# Patient Record
Sex: Female | Born: 1945 | Race: White | Hispanic: No | Smoking: Never smoker
Health system: Southern US, Community
[De-identification: ages and names within clinical notes are randomized; demographics above are authoritative.]

## PROBLEM LIST (undated history)

## (undated) DIAGNOSIS — F3289 Other specified depressive episodes: Secondary | ICD-10-CM

## (undated) DIAGNOSIS — K5792 Diverticulitis of intestine, part unspecified, without perforation or abscess without bleeding: Secondary | ICD-10-CM

## (undated) DIAGNOSIS — G252 Other specified forms of tremor: Secondary | ICD-10-CM

## (undated) DIAGNOSIS — R300 Dysuria: Secondary | ICD-10-CM

## (undated) DIAGNOSIS — K047 Periapical abscess without sinus: Secondary | ICD-10-CM

## (undated) DIAGNOSIS — R935 Abnormal findings on diagnostic imaging of other abdominal regions, including retroperitoneum: Secondary | ICD-10-CM

## (undated) DIAGNOSIS — R32 Unspecified urinary incontinence: Secondary | ICD-10-CM

## (undated) DIAGNOSIS — F329 Major depressive disorder, single episode, unspecified: Secondary | ICD-10-CM

## (undated) DIAGNOSIS — G250 Essential tremor: Secondary | ICD-10-CM

## (undated) DIAGNOSIS — M25569 Pain in unspecified knee: Secondary | ICD-10-CM

## (undated) DIAGNOSIS — K7460 Unspecified cirrhosis of liver: Secondary | ICD-10-CM

## (undated) DIAGNOSIS — K219 Gastro-esophageal reflux disease without esophagitis: Secondary | ICD-10-CM

## (undated) DIAGNOSIS — F341 Dysthymic disorder: Secondary | ICD-10-CM

## (undated) DIAGNOSIS — N3000 Acute cystitis without hematuria: Secondary | ICD-10-CM

## (undated) DIAGNOSIS — J029 Acute pharyngitis, unspecified: Secondary | ICD-10-CM

## (undated) DIAGNOSIS — K862 Cyst of pancreas: Secondary | ICD-10-CM

## (undated) DIAGNOSIS — K589 Irritable bowel syndrome without diarrhea: Secondary | ICD-10-CM

## (undated) DIAGNOSIS — R609 Edema, unspecified: Secondary | ICD-10-CM

## (undated) DIAGNOSIS — C649 Malignant neoplasm of unspecified kidney, except renal pelvis: Secondary | ICD-10-CM

## (undated) DIAGNOSIS — E119 Type 2 diabetes mellitus without complications: Secondary | ICD-10-CM

## (undated) DIAGNOSIS — M719 Bursopathy, unspecified: Secondary | ICD-10-CM

## (undated) DIAGNOSIS — I498 Other specified cardiac arrhythmias: Secondary | ICD-10-CM

## (undated) DIAGNOSIS — G4730 Sleep apnea, unspecified: Secondary | ICD-10-CM

## (undated) DIAGNOSIS — R932 Abnormal findings on diagnostic imaging of liver and biliary tract: Secondary | ICD-10-CM

## (undated) DIAGNOSIS — K5730 Diverticulosis of large intestine without perforation or abscess without bleeding: Secondary | ICD-10-CM

## (undated) DIAGNOSIS — I499 Cardiac arrhythmia, unspecified: Secondary | ICD-10-CM

## (undated) DIAGNOSIS — J439 Emphysema, unspecified: Secondary | ICD-10-CM

## (undated) DIAGNOSIS — E1149 Type 2 diabetes mellitus with other diabetic neurological complication: Secondary | ICD-10-CM

## (undated) DIAGNOSIS — T8859XA Other complications of anesthesia, initial encounter: Secondary | ICD-10-CM

## (undated) DIAGNOSIS — T4145XA Adverse effect of unspecified anesthetic, initial encounter: Secondary | ICD-10-CM

## (undated) DIAGNOSIS — I509 Heart failure, unspecified: Secondary | ICD-10-CM

## (undated) DIAGNOSIS — Z5189 Encounter for other specified aftercare: Secondary | ICD-10-CM

## (undated) DIAGNOSIS — R9389 Abnormal findings on diagnostic imaging of other specified body structures: Secondary | ICD-10-CM

## (undated) DIAGNOSIS — E669 Obesity, unspecified: Secondary | ICD-10-CM

## (undated) DIAGNOSIS — E1142 Type 2 diabetes mellitus with diabetic polyneuropathy: Secondary | ICD-10-CM

## (undated) DIAGNOSIS — K863 Pseudocyst of pancreas: Secondary | ICD-10-CM

## (undated) DIAGNOSIS — M1990 Unspecified osteoarthritis, unspecified site: Secondary | ICD-10-CM

## (undated) DIAGNOSIS — F419 Anxiety disorder, unspecified: Secondary | ICD-10-CM

## (undated) DIAGNOSIS — E7800 Pure hypercholesterolemia, unspecified: Secondary | ICD-10-CM

## (undated) DIAGNOSIS — R112 Nausea with vomiting, unspecified: Secondary | ICD-10-CM

## (undated) DIAGNOSIS — J309 Allergic rhinitis, unspecified: Secondary | ICD-10-CM

## (undated) DIAGNOSIS — H269 Unspecified cataract: Secondary | ICD-10-CM

## (undated) DIAGNOSIS — Z87442 Personal history of urinary calculi: Secondary | ICD-10-CM

## (undated) DIAGNOSIS — Z9889 Other specified postprocedural states: Secondary | ICD-10-CM

## (undated) DIAGNOSIS — Z8719 Personal history of other diseases of the digestive system: Secondary | ICD-10-CM

## (undated) DIAGNOSIS — J45909 Unspecified asthma, uncomplicated: Secondary | ICD-10-CM

## (undated) DIAGNOSIS — D126 Benign neoplasm of colon, unspecified: Secondary | ICD-10-CM

## (undated) DIAGNOSIS — T7840XA Allergy, unspecified, initial encounter: Secondary | ICD-10-CM

## (undated) DIAGNOSIS — K769 Liver disease, unspecified: Secondary | ICD-10-CM

## (undated) DIAGNOSIS — R0600 Dyspnea, unspecified: Secondary | ICD-10-CM

## (undated) DIAGNOSIS — J984 Other disorders of lung: Secondary | ICD-10-CM

## (undated) HISTORY — DX: Other specified forms of tremor: G25.2

## (undated) HISTORY — DX: Type 2 diabetes mellitus without complications: E11.9

## (undated) HISTORY — DX: Sleep apnea, unspecified: G47.30

## (undated) HISTORY — DX: Type 2 diabetes mellitus with other diabetic neurological complication: E11.49

## (undated) HISTORY — DX: Gastro-esophageal reflux disease without esophagitis: K21.9

## (undated) HISTORY — DX: Allergic rhinitis, unspecified: J30.9

## (undated) HISTORY — DX: Other specified cardiac arrhythmias: I49.8

## (undated) HISTORY — DX: Edema, unspecified: R60.9

## (undated) HISTORY — DX: Abnormal findings on diagnostic imaging of liver and biliary tract: R93.2

## (undated) HISTORY — DX: Cyst of pancreas: K86.2

## (undated) HISTORY — DX: Major depressive disorder, single episode, unspecified: F32.9

## (undated) HISTORY — DX: Pain in unspecified knee: M25.569

## (undated) HISTORY — DX: Other specified depressive episodes: F32.89

## (undated) HISTORY — PX: ABDOMINAL HYSTERECTOMY: SHX81

## (undated) HISTORY — DX: Other disorders of lung: J98.4

## (undated) HISTORY — DX: Irritable bowel syndrome, unspecified: K58.9

## (undated) HISTORY — DX: Abnormal findings on diagnostic imaging of other abdominal regions, including retroperitoneum: R93.5

## (undated) HISTORY — DX: Diverticulosis of large intestine without perforation or abscess without bleeding: K57.30

## (undated) HISTORY — DX: Dysuria: R30.0

## (undated) HISTORY — PX: APPENDECTOMY: SHX54

## (undated) HISTORY — DX: Periapical abscess without sinus: K04.7

## (undated) HISTORY — DX: Type 2 diabetes mellitus with diabetic polyneuropathy: E11.42

## (undated) HISTORY — PX: HERNIA REPAIR: SHX51

## (undated) HISTORY — DX: Cyst of pancreas: K86.3

## (undated) HISTORY — DX: Diverticulitis of intestine, part unspecified, without perforation or abscess without bleeding: K57.92

## (undated) HISTORY — DX: Unspecified urinary incontinence: R32

## (undated) HISTORY — DX: Liver disease, unspecified: K76.9

## (undated) HISTORY — PX: LITHOTRIPSY: SUR834

## (undated) HISTORY — DX: Benign neoplasm of colon, unspecified: D12.6

## (undated) HISTORY — DX: Acute cystitis without hematuria: N30.00

## (undated) HISTORY — DX: Pure hypercholesterolemia, unspecified: E78.00

## (undated) HISTORY — DX: Acute pharyngitis, unspecified: J02.9

## (undated) HISTORY — PX: CHOLECYSTECTOMY: SHX55

## (undated) HISTORY — DX: Dysthymic disorder: F34.1

## (undated) HISTORY — DX: Obesity, unspecified: E66.9

## (undated) HISTORY — DX: Essential tremor: G25.0

## (undated) HISTORY — DX: Abnormal findings on diagnostic imaging of other specified body structures: R93.89

---
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
## 1979-03-23 HISTORY — PX: KIDNEY STONE SURGERY: SHX686

## 1989-03-22 HISTORY — PX: PARTIAL HYSTERECTOMY: SHX80

## 1997-11-17 ENCOUNTER — Emergency Department (HOSPITAL_COMMUNITY): Admission: EM | Admit: 1997-11-17 | Discharge: 1997-11-17 | Payer: Self-pay | Admitting: Emergency Medicine

## 1998-09-09 ENCOUNTER — Encounter: Payer: Self-pay | Admitting: Emergency Medicine

## 1998-09-09 ENCOUNTER — Emergency Department (HOSPITAL_COMMUNITY): Admission: EM | Admit: 1998-09-09 | Discharge: 1998-09-09 | Payer: Self-pay | Admitting: Emergency Medicine

## 1998-09-25 ENCOUNTER — Encounter: Payer: Self-pay | Admitting: Surgery

## 1998-09-27 ENCOUNTER — Ambulatory Visit (HOSPITAL_COMMUNITY): Admission: RE | Admit: 1998-09-27 | Discharge: 1998-09-28 | Payer: Self-pay | Admitting: Surgery

## 1998-09-27 ENCOUNTER — Encounter: Payer: Self-pay | Admitting: Surgery

## 2000-07-21 ENCOUNTER — Encounter: Admission: RE | Admit: 2000-07-21 | Discharge: 2000-07-21 | Payer: Self-pay

## 2002-04-21 ENCOUNTER — Ambulatory Visit (HOSPITAL_COMMUNITY): Admission: RE | Admit: 2002-04-21 | Discharge: 2002-04-21 | Payer: Self-pay

## 2003-01-04 ENCOUNTER — Ambulatory Visit (HOSPITAL_COMMUNITY): Admission: RE | Admit: 2003-01-04 | Discharge: 2003-01-04 | Payer: Self-pay | Admitting: Internal Medicine

## 2003-07-23 HISTORY — PX: ROTATOR CUFF REPAIR: SHX139

## 2003-07-31 ENCOUNTER — Ambulatory Visit (HOSPITAL_BASED_OUTPATIENT_CLINIC_OR_DEPARTMENT_OTHER): Admission: RE | Admit: 2003-07-31 | Discharge: 2003-07-31 | Payer: Self-pay

## 2004-07-25 ENCOUNTER — Inpatient Hospital Stay (HOSPITAL_COMMUNITY): Admission: EM | Admit: 2004-07-25 | Discharge: 2004-07-27 | Payer: Self-pay | Admitting: Emergency Medicine

## 2004-08-10 ENCOUNTER — Ambulatory Visit (HOSPITAL_COMMUNITY): Admission: RE | Admit: 2004-08-10 | Discharge: 2004-08-10 | Payer: Self-pay | Admitting: Orthopedic Surgery

## 2004-10-23 ENCOUNTER — Ambulatory Visit: Payer: Self-pay | Admitting: Internal Medicine

## 2004-12-26 ENCOUNTER — Ambulatory Visit: Payer: Self-pay | Admitting: Internal Medicine

## 2004-12-28 ENCOUNTER — Encounter: Admission: RE | Admit: 2004-12-28 | Discharge: 2004-12-28 | Payer: Self-pay | Admitting: Neurology

## 2005-05-27 ENCOUNTER — Ambulatory Visit: Payer: Self-pay | Admitting: Internal Medicine

## 2005-11-25 ENCOUNTER — Ambulatory Visit: Payer: Self-pay | Admitting: Internal Medicine

## 2006-03-25 ENCOUNTER — Ambulatory Visit: Payer: Self-pay | Admitting: Internal Medicine

## 2006-04-02 ENCOUNTER — Ambulatory Visit: Payer: Self-pay | Admitting: Internal Medicine

## 2006-04-03 ENCOUNTER — Ambulatory Visit: Payer: Self-pay | Admitting: Internal Medicine

## 2006-05-02 ENCOUNTER — Ambulatory Visit: Payer: Self-pay | Admitting: Internal Medicine

## 2006-05-02 LAB — CONVERTED CEMR LAB
BUN: 15 mg/dL (ref 6–23)
Creatinine, Ser: 1.1 mg/dL (ref 0.4–1.2)

## 2006-05-07 ENCOUNTER — Ambulatory Visit (HOSPITAL_COMMUNITY): Admission: RE | Admit: 2006-05-07 | Discharge: 2006-05-07 | Payer: Self-pay | Admitting: Internal Medicine

## 2006-05-15 ENCOUNTER — Ambulatory Visit (HOSPITAL_COMMUNITY): Admission: RE | Admit: 2006-05-15 | Discharge: 2006-05-15 | Payer: Self-pay | Admitting: Internal Medicine

## 2006-05-29 ENCOUNTER — Ambulatory Visit (HOSPITAL_COMMUNITY): Admission: RE | Admit: 2006-05-29 | Discharge: 2006-05-29 | Payer: Self-pay | Admitting: Gastroenterology

## 2006-05-29 ENCOUNTER — Encounter (INDEPENDENT_AMBULATORY_CARE_PROVIDER_SITE_OTHER): Payer: Self-pay | Admitting: Specialist

## 2006-06-05 ENCOUNTER — Ambulatory Visit: Payer: Self-pay | Admitting: Gastroenterology

## 2006-06-24 ENCOUNTER — Ambulatory Visit: Payer: Self-pay | Admitting: Family Medicine

## 2006-07-08 ENCOUNTER — Ambulatory Visit: Payer: Self-pay | Admitting: Family Medicine

## 2006-07-24 ENCOUNTER — Encounter: Payer: Self-pay | Admitting: Family Medicine

## 2006-07-24 ENCOUNTER — Encounter: Admission: RE | Admit: 2006-07-24 | Discharge: 2006-07-24 | Payer: Self-pay | Admitting: Family Medicine

## 2006-07-29 ENCOUNTER — Ambulatory Visit: Payer: Self-pay | Admitting: Family Medicine

## 2006-07-31 ENCOUNTER — Encounter: Admission: RE | Admit: 2006-07-31 | Discharge: 2006-10-29 | Payer: Self-pay | Admitting: Family Medicine

## 2006-08-01 ENCOUNTER — Encounter: Payer: Self-pay | Admitting: Family Medicine

## 2006-08-01 ENCOUNTER — Encounter: Admission: RE | Admit: 2006-08-01 | Discharge: 2006-08-01 | Payer: Self-pay | Admitting: Family Medicine

## 2006-08-07 ENCOUNTER — Ambulatory Visit: Payer: Self-pay | Admitting: Internal Medicine

## 2006-08-12 ENCOUNTER — Ambulatory Visit: Payer: Self-pay | Admitting: Family Medicine

## 2006-08-13 ENCOUNTER — Ambulatory Visit (HOSPITAL_COMMUNITY): Admission: RE | Admit: 2006-08-13 | Discharge: 2006-08-13 | Payer: Self-pay | Admitting: Internal Medicine

## 2006-08-25 ENCOUNTER — Ambulatory Visit: Payer: Self-pay | Admitting: Internal Medicine

## 2006-08-25 LAB — CONVERTED CEMR LAB
ANA Titer 1: 1:160 {titer} — ABNORMAL HIGH
Anti Nuclear Antibody(ANA): POSITIVE — AB
IgM, Serum: 160 mg/dL (ref 60–263)

## 2006-09-23 ENCOUNTER — Ambulatory Visit (HOSPITAL_COMMUNITY): Admission: RE | Admit: 2006-09-23 | Discharge: 2006-09-23 | Payer: Self-pay | Admitting: Internal Medicine

## 2006-09-25 ENCOUNTER — Ambulatory Visit: Payer: Self-pay | Admitting: Family Medicine

## 2006-09-26 LAB — CONVERTED CEMR LAB: Hgb A1c MFr Bld: 7.2 % — ABNORMAL HIGH (ref 4.6–6.0)

## 2006-10-03 ENCOUNTER — Ambulatory Visit: Payer: Self-pay | Admitting: Internal Medicine

## 2006-10-13 ENCOUNTER — Ambulatory Visit (HOSPITAL_COMMUNITY): Admission: RE | Admit: 2006-10-13 | Discharge: 2006-10-13 | Payer: Self-pay | Admitting: Internal Medicine

## 2006-10-13 ENCOUNTER — Encounter (INDEPENDENT_AMBULATORY_CARE_PROVIDER_SITE_OTHER): Payer: Self-pay | Admitting: *Deleted

## 2006-11-08 ENCOUNTER — Emergency Department (HOSPITAL_COMMUNITY): Admission: EM | Admit: 2006-11-08 | Discharge: 2006-11-08 | Payer: Self-pay | Admitting: Emergency Medicine

## 2006-11-17 ENCOUNTER — Ambulatory Visit: Payer: Self-pay | Admitting: Internal Medicine

## 2006-11-17 LAB — CONVERTED CEMR LAB
Albumin: 3.4 g/dL — ABNORMAL LOW (ref 3.5–5.2)
Bilirubin, Direct: 0.1 mg/dL (ref 0.0–0.3)
Total Bilirubin: 0.8 mg/dL (ref 0.3–1.2)

## 2006-11-20 ENCOUNTER — Ambulatory Visit: Payer: Self-pay | Admitting: Internal Medicine

## 2006-12-09 ENCOUNTER — Encounter: Payer: Self-pay | Admitting: Family Medicine

## 2006-12-18 ENCOUNTER — Ambulatory Visit: Payer: Self-pay | Admitting: Internal Medicine

## 2006-12-18 ENCOUNTER — Encounter: Payer: Self-pay | Admitting: Internal Medicine

## 2006-12-18 ENCOUNTER — Encounter: Payer: Self-pay | Admitting: Family Medicine

## 2006-12-18 LAB — HM COLONOSCOPY: HM Colonoscopy: POSITIVE

## 2006-12-22 ENCOUNTER — Ambulatory Visit: Payer: Self-pay | Admitting: Family Medicine

## 2006-12-22 DIAGNOSIS — E1165 Type 2 diabetes mellitus with hyperglycemia: Secondary | ICD-10-CM | POA: Insufficient documentation

## 2006-12-22 LAB — CONVERTED CEMR LAB: Hgb A1c MFr Bld: 7.6 % — ABNORMAL HIGH (ref 4.6–6.0)

## 2006-12-30 ENCOUNTER — Ambulatory Visit: Payer: Self-pay | Admitting: Internal Medicine

## 2006-12-30 DIAGNOSIS — K5730 Diverticulosis of large intestine without perforation or abscess without bleeding: Secondary | ICD-10-CM | POA: Insufficient documentation

## 2006-12-30 DIAGNOSIS — K589 Irritable bowel syndrome without diarrhea: Secondary | ICD-10-CM

## 2006-12-30 DIAGNOSIS — J3089 Other allergic rhinitis: Secondary | ICD-10-CM

## 2006-12-30 DIAGNOSIS — G4733 Obstructive sleep apnea (adult) (pediatric): Secondary | ICD-10-CM

## 2006-12-30 DIAGNOSIS — J302 Other seasonal allergic rhinitis: Secondary | ICD-10-CM

## 2006-12-30 DIAGNOSIS — D126 Benign neoplasm of colon, unspecified: Secondary | ICD-10-CM

## 2006-12-30 DIAGNOSIS — K219 Gastro-esophageal reflux disease without esophagitis: Secondary | ICD-10-CM | POA: Insufficient documentation

## 2006-12-30 DIAGNOSIS — R32 Unspecified urinary incontinence: Secondary | ICD-10-CM | POA: Insufficient documentation

## 2006-12-30 DIAGNOSIS — E669 Obesity, unspecified: Secondary | ICD-10-CM

## 2006-12-30 DIAGNOSIS — R002 Palpitations: Secondary | ICD-10-CM | POA: Insufficient documentation

## 2006-12-30 DIAGNOSIS — G252 Other specified forms of tremor: Secondary | ICD-10-CM

## 2006-12-30 DIAGNOSIS — E7800 Pure hypercholesterolemia, unspecified: Secondary | ICD-10-CM | POA: Insufficient documentation

## 2006-12-30 DIAGNOSIS — G250 Essential tremor: Secondary | ICD-10-CM

## 2007-01-13 ENCOUNTER — Ambulatory Visit: Payer: Self-pay | Admitting: *Deleted

## 2007-01-20 ENCOUNTER — Ambulatory Visit (HOSPITAL_COMMUNITY): Admission: RE | Admit: 2007-01-20 | Discharge: 2007-01-20 | Payer: Self-pay | Admitting: Internal Medicine

## 2007-02-22 DIAGNOSIS — K7689 Other specified diseases of liver: Secondary | ICD-10-CM

## 2007-05-12 ENCOUNTER — Ambulatory Visit: Payer: Self-pay | Admitting: Family Medicine

## 2007-05-12 LAB — CONVERTED CEMR LAB
Blood in Urine, dipstick: NEGATIVE
Glucose, Urine, Semiquant: >=1000
Nitrite: NEGATIVE
Specific Gravity, Urine: >=1.03
WBC Urine, dipstick: NEGATIVE
pH: 6

## 2007-05-19 ENCOUNTER — Ambulatory Visit: Payer: Self-pay | Admitting: Family Medicine

## 2007-05-21 LAB — CONVERTED CEMR LAB
Direct LDL: 125.2 mg/dL
Hgb A1c MFr Bld: 9.8 % — ABNORMAL HIGH (ref 4.6–6.0)
Triglycerides: 206 mg/dL (ref 0–149)

## 2007-06-01 ENCOUNTER — Encounter (INDEPENDENT_AMBULATORY_CARE_PROVIDER_SITE_OTHER): Payer: Self-pay | Admitting: *Deleted

## 2007-06-03 ENCOUNTER — Telehealth (INDEPENDENT_AMBULATORY_CARE_PROVIDER_SITE_OTHER): Payer: Self-pay | Admitting: *Deleted

## 2007-06-04 ENCOUNTER — Ambulatory Visit: Payer: Self-pay | Admitting: Family Medicine

## 2007-06-05 LAB — CONVERTED CEMR LAB
ALT: 97 units/L — ABNORMAL HIGH (ref 0–35)
AST: 68 units/L — ABNORMAL HIGH (ref 0–37)

## 2007-06-24 ENCOUNTER — Ambulatory Visit: Payer: Self-pay | Admitting: Family Medicine

## 2007-06-29 LAB — CONVERTED CEMR LAB
ALT: 73 units/L — ABNORMAL HIGH (ref 0–35)
AST: 59 units/L — ABNORMAL HIGH (ref 0–37)

## 2007-07-27 ENCOUNTER — Ambulatory Visit: Payer: Self-pay | Admitting: Family Medicine

## 2007-07-27 LAB — CONVERTED CEMR LAB
Bilirubin, Direct: 0.2 mg/dL (ref 0.0–0.3)
Hgb A1c MFr Bld: 8.7 % — ABNORMAL HIGH (ref 4.6–6.0)
Total Protein: 6.5 g/dL (ref 6.0–8.3)

## 2007-07-28 ENCOUNTER — Ambulatory Visit: Payer: Self-pay | Admitting: Family Medicine

## 2007-07-29 DIAGNOSIS — E1142 Type 2 diabetes mellitus with diabetic polyneuropathy: Secondary | ICD-10-CM | POA: Insufficient documentation

## 2007-07-30 ENCOUNTER — Encounter: Payer: Self-pay | Admitting: Family Medicine

## 2007-08-03 ENCOUNTER — Encounter (INDEPENDENT_AMBULATORY_CARE_PROVIDER_SITE_OTHER): Payer: Self-pay | Admitting: *Deleted

## 2007-08-03 DIAGNOSIS — K862 Cyst of pancreas: Secondary | ICD-10-CM

## 2007-08-03 DIAGNOSIS — K863 Pseudocyst of pancreas: Secondary | ICD-10-CM

## 2007-08-03 DIAGNOSIS — R911 Solitary pulmonary nodule: Secondary | ICD-10-CM

## 2007-08-07 ENCOUNTER — Ambulatory Visit: Payer: Self-pay | Admitting: Cardiology

## 2007-08-10 ENCOUNTER — Telehealth: Payer: Self-pay | Admitting: Family Medicine

## 2007-08-26 ENCOUNTER — Ambulatory Visit: Payer: Self-pay | Admitting: Family Medicine

## 2007-08-26 LAB — CONVERTED CEMR LAB: Rapid Strep: POSITIVE

## 2007-09-04 ENCOUNTER — Ambulatory Visit: Payer: Self-pay | Admitting: Internal Medicine

## 2007-11-03 ENCOUNTER — Ambulatory Visit: Payer: Self-pay | Admitting: Family Medicine

## 2007-11-03 LAB — CONVERTED CEMR LAB: Blood Glucose, Fasting: 210 mg/dL

## 2007-11-06 LAB — CONVERTED CEMR LAB: Hgb A1c MFr Bld: 9.8 % — ABNORMAL HIGH (ref 4.6–6.0)

## 2007-11-11 ENCOUNTER — Telehealth: Payer: Self-pay | Admitting: Family Medicine

## 2007-11-11 ENCOUNTER — Ambulatory Visit: Payer: Self-pay | Admitting: Family Medicine

## 2007-11-16 ENCOUNTER — Telehealth: Payer: Self-pay | Admitting: Family Medicine

## 2007-11-17 ENCOUNTER — Telehealth: Payer: Self-pay | Admitting: Family Medicine

## 2007-11-23 ENCOUNTER — Encounter (INDEPENDENT_AMBULATORY_CARE_PROVIDER_SITE_OTHER): Payer: Self-pay | Admitting: *Deleted

## 2008-02-12 ENCOUNTER — Encounter: Payer: Self-pay | Admitting: Family Medicine

## 2008-02-23 ENCOUNTER — Ambulatory Visit: Payer: Self-pay | Admitting: Family Medicine

## 2008-02-23 LAB — CONVERTED CEMR LAB
Bilirubin Urine: NEGATIVE
Casts: 0 /lpf
Epithelial cells, urine: 0 /lpf
Glucose, Urine, Semiquant: NEGATIVE
Protein, U semiquant: 100
Urine crystals, microscopic: 0 /hpf
Urobilinogen, UA: 0.2

## 2008-02-26 ENCOUNTER — Telehealth: Payer: Self-pay | Admitting: Family Medicine

## 2008-02-29 ENCOUNTER — Ambulatory Visit: Payer: Self-pay | Admitting: Internal Medicine

## 2008-03-01 ENCOUNTER — Ambulatory Visit: Payer: Self-pay | Admitting: Cardiovascular Disease

## 2008-03-07 ENCOUNTER — Telehealth: Payer: Self-pay | Admitting: Family Medicine

## 2008-03-08 ENCOUNTER — Telehealth: Payer: Self-pay | Admitting: Internal Medicine

## 2008-03-29 ENCOUNTER — Encounter: Payer: Self-pay | Admitting: Internal Medicine

## 2008-04-15 ENCOUNTER — Encounter: Payer: Self-pay | Admitting: Family Medicine

## 2008-06-03 ENCOUNTER — Encounter: Payer: Self-pay | Admitting: Family Medicine

## 2008-06-09 ENCOUNTER — Telehealth: Payer: Self-pay | Admitting: Family Medicine

## 2008-07-18 ENCOUNTER — Ambulatory Visit: Payer: Self-pay | Admitting: Pulmonary Disease

## 2008-08-29 ENCOUNTER — Ambulatory Visit: Payer: Self-pay | Admitting: Internal Medicine

## 2008-09-14 ENCOUNTER — Encounter (INDEPENDENT_AMBULATORY_CARE_PROVIDER_SITE_OTHER): Payer: Self-pay | Admitting: *Deleted

## 2008-09-15 ENCOUNTER — Ambulatory Visit: Payer: Self-pay | Admitting: Family Medicine

## 2008-09-20 LAB — CONVERTED CEMR LAB
ALT: 68 units/L — ABNORMAL HIGH (ref 0–35)
AST: 51 units/L — ABNORMAL HIGH (ref 0–37)
Bilirubin, Direct: 0.1 mg/dL (ref 0.0–0.3)
CO2: 28 meq/L (ref 19–32)
Chloride: 106 meq/L (ref 96–112)
Cholesterol: 220 mg/dL (ref 0–200)
Creatinine, Ser: 0.8 mg/dL (ref 0.4–1.2)
Glucose, Bld: 166 mg/dL — ABNORMAL HIGH (ref 70–99)
Sodium: 141 meq/L (ref 135–145)
Total Bilirubin: 1 mg/dL (ref 0.3–1.2)
Total CHOL/HDL Ratio: 5.7
Total Protein: 6.8 g/dL (ref 6.0–8.3)
Triglycerides: 173 mg/dL — ABNORMAL HIGH (ref 0–149)

## 2008-09-21 ENCOUNTER — Ambulatory Visit: Payer: Self-pay | Admitting: Family Medicine

## 2008-09-21 LAB — CONVERTED CEMR LAB: Cholesterol, target level: 200 mg/dL

## 2009-04-06 ENCOUNTER — Ambulatory Visit: Payer: Self-pay | Admitting: Family Medicine

## 2009-04-06 DIAGNOSIS — H9319 Tinnitus, unspecified ear: Secondary | ICD-10-CM | POA: Insufficient documentation

## 2009-04-07 ENCOUNTER — Ambulatory Visit: Payer: Self-pay | Admitting: Family Medicine

## 2009-04-13 LAB — CONVERTED CEMR LAB
ALT: 61 units/L — ABNORMAL HIGH (ref 0–35)
AST: 45 units/L — ABNORMAL HIGH (ref 0–37)
Albumin: 3.5 g/dL (ref 3.5–5.2)
Alkaline Phosphatase: 68 units/L (ref 39–117)
BUN: 12 mg/dL (ref 6–23)
Chloride: 110 meq/L (ref 96–112)
Creatinine,U: 73.5 mg/dL
GFR calc non Af Amer: 76.97 mL/min (ref >60)
Glucose, Bld: 118 mg/dL — ABNORMAL HIGH (ref 70–99)
Hgb A1c MFr Bld: 7.4 % — ABNORMAL HIGH (ref 4.6–6.5)
LDL Cholesterol: 108 mg/dL — ABNORMAL HIGH (ref 0–99)
Microalb, Ur: 0.1 mg/dL (ref 0.0–1.9)
Potassium: 4.8 meq/L (ref 3.5–5.1)
Sodium: 143 meq/L (ref 135–145)
Total Bilirubin: 1.1 mg/dL (ref 0.3–1.2)
VLDL: 23.6 mg/dL (ref 0.0–40.0)

## 2009-06-28 ENCOUNTER — Ambulatory Visit: Payer: Self-pay | Admitting: Family Medicine

## 2009-07-25 ENCOUNTER — Ambulatory Visit: Payer: Self-pay | Admitting: Family Medicine

## 2009-10-18 ENCOUNTER — Ambulatory Visit: Payer: Self-pay | Admitting: Family Medicine

## 2009-10-18 ENCOUNTER — Telehealth: Payer: Self-pay | Admitting: Family Medicine

## 2009-10-18 LAB — CONVERTED CEMR LAB
Alkaline Phosphatase: 63 units/L (ref 39–117)
Bilirubin, Direct: 0.2 mg/dL (ref 0.0–0.3)
Calcium: 9.6 mg/dL (ref 8.4–10.5)
GFR calc non Af Amer: 67.07 mL/min (ref >60)
HDL: 38.7 mg/dL — ABNORMAL LOW (ref >39.00)
Potassium: 4.6 meq/L (ref 3.5–5.1)
Sodium: 143 meq/L (ref 135–145)
Total Bilirubin: 1.2 mg/dL (ref 0.3–1.2)
Total CHOL/HDL Ratio: 4
VLDL: 22.4 mg/dL (ref 0.0–40.0)

## 2010-03-05 ENCOUNTER — Telehealth: Payer: Self-pay | Admitting: Family Medicine

## 2010-03-07 ENCOUNTER — Ambulatory Visit: Payer: Self-pay | Admitting: Family Medicine

## 2010-03-07 LAB — CONVERTED CEMR LAB
Bacteria, UA: 0
Ketones, urine, test strip: NEGATIVE
Nitrite: NEGATIVE
RBC / HPF: 0
Specific Gravity, Urine: 1.02
Urobilinogen, UA: 0.2

## 2010-03-27 ENCOUNTER — Telehealth: Payer: Self-pay | Admitting: Family Medicine

## 2010-03-28 ENCOUNTER — Ambulatory Visit: Payer: Self-pay | Admitting: Family Medicine

## 2010-03-28 LAB — CONVERTED CEMR LAB
Bilirubin Urine: NEGATIVE
Glucose, Urine, Semiquant: NEGATIVE
Ketones, urine, test strip: NEGATIVE
Yeast, UA: 0
pH: 6

## 2010-08-06 ENCOUNTER — Telehealth: Payer: Self-pay | Admitting: Family Medicine

## 2010-08-12 ENCOUNTER — Encounter: Payer: Self-pay | Admitting: Family Medicine

## 2010-08-20 ENCOUNTER — Ambulatory Visit
Admission: RE | Admit: 2010-08-20 | Discharge: 2010-08-20 | Payer: Self-pay | Source: Home / Self Care | Attending: Family Medicine | Admitting: Family Medicine

## 2010-08-20 ENCOUNTER — Other Ambulatory Visit: Payer: Self-pay | Admitting: Family Medicine

## 2010-08-20 ENCOUNTER — Encounter: Payer: Self-pay | Admitting: Family Medicine

## 2010-08-20 DIAGNOSIS — R911 Solitary pulmonary nodule: Secondary | ICD-10-CM

## 2010-08-20 DIAGNOSIS — K863 Pseudocyst of pancreas: Secondary | ICD-10-CM

## 2010-08-20 DIAGNOSIS — E559 Vitamin D deficiency, unspecified: Secondary | ICD-10-CM | POA: Insufficient documentation

## 2010-08-20 DIAGNOSIS — Z1239 Encounter for other screening for malignant neoplasm of breast: Secondary | ICD-10-CM

## 2010-08-20 LAB — LIPID PANEL
Cholesterol: 204 mg/dL — ABNORMAL HIGH (ref 0–200)
HDL: 39.8 mg/dL (ref >39.00)
Total CHOL/HDL Ratio: 5
Triglycerides: 216 mg/dL — ABNORMAL HIGH (ref 0.0–149.0)
VLDL: 43.2 mg/dL — ABNORMAL HIGH (ref 0.0–40.0)

## 2010-08-20 LAB — BASIC METABOLIC PANEL
BUN: 12 mg/dL (ref 6–23)
CO2: 29 mEq/L (ref 19–32)
Chloride: 99 mEq/L (ref 96–112)
Glucose, Bld: 269 mg/dL — ABNORMAL HIGH (ref 70–99)

## 2010-08-20 LAB — CONVERTED CEMR LAB: KOH Prep: NEGATIVE

## 2010-08-20 LAB — HEPATIC FUNCTION PANEL
ALT: 97 U/L — ABNORMAL HIGH (ref 0–35)
AST: 72 U/L — ABNORMAL HIGH (ref 0–37)
Albumin: 3.9 g/dL (ref 3.5–5.2)
Alkaline Phosphatase: 88 U/L (ref 39–117)
Total Protein: 6.8 g/dL (ref 6.0–8.3)

## 2010-08-20 LAB — HEMOGLOBIN A1C: Hgb A1c MFr Bld: 11.2 % — ABNORMAL HIGH (ref 4.6–6.5)

## 2010-08-20 LAB — HM DIABETES FOOT EXAM

## 2010-08-21 NOTE — Progress Notes (Signed)
Summary: uti  Phone Note Call from Patient Call back at Home Phone [phone]   Caller: Patient Call For: Dr. Glori Bickers Summary of Call: Patient was seen on 8-17 for uti symptoms. She says that she wasn't treated for the uti, but was treated for a yeast infection. She says that she still has uti. Has alot of burning and pressure w/ urination. She is asking if she could have [facility] [address]. Please advise.  Initial call taken by: Lacretia Nicks,  March 27, 2010 9:47 AM  Follow-up for Phone Call        urine was clear when she was here - so need to re check it and send for cx  please drop off urine specimen and then I will call in med  thanks for the update Follow-up by: Allena Earing MD,  March 27, 2010 9:52 AM  Additional Follow-up for Phone Call Additional follow up Details #1::        Patient notified as instructed by telephone. Pt said will bring urine in the am.Rena [facility] LPN  March 27, 1641 1:05 PM

## 2010-08-21 NOTE — Assessment & Plan Note (Signed)
Summary: ?uti   Vital Signs:  Patient profile:   65 year old female Height:      67 inches Weight:      212.50 pounds BMI:     33.40 Temp:     98.1 degrees F oral Pulse rate:   68 / minute Pulse rhythm:   regular BP sitting:   122 / 68  (left arm) Cuff size:   large  Vitals Entered By: Ozzie Hoyle LPN (March 07, 7321 3:28 PM) CC: ?UTI Burn upon urination with frequency of urine   History of Present Illness: thinks she has uti her bottom is "raw" - feels like a yeast infection , itch  this could be because sugar is high  some pressure with urination   also having freqent urination -- there is a lot of sugar in it  she has not been paying attn to her sugar  not eating her DM diet   has been thirsty   took macrobid in the past for frequent utis -- been off of it for 6 months   no  fever or n/v took some cystex -- upset stomach     Allergies: 1)  ! * Iv Dye 2)  ! 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  no exercise 2 cups coffee a day married 5 children Husband treated for testicular cancer  Risk Factors: Exercise: yes (05/12/2007)  Risk Factors: Smoking Status: never (09/04/2007)  Review of Systems General:  Complains of fatigue; denies chills, fever, and loss of appetite. Eyes:  Denies blurring and eye irritation. CV:  Denies chest pain or discomfort and lightheadness. Resp:  Denies cough. GI:  Complains of nausea; denies vomiting. GU:  Complains of discharge and dysuria; denies abnormal vaginal bleeding. Derm:  Complains of itching. Neuro:  Denies numbness and tingling. Endo:  Complains of excessive thirst and excessive urination.  Physical Exam  General:  Obese female in NAD Head:  normocephalic, atraumatic, and no abnormalities observed.   Mouth:  pharynx pink and moist.   Neck:  No deformities, masses, or tenderness noted. Lungs:  Normal respiratory effort, chest expands symmetrically. Lungs are clear to auscultation, no crackles or wheezes. Heart:  Normal rate and regular rhythm. S1 and S2 normal without gallop, murmur, click, rub or other extra sounds. Abdomen:  very mild  tenderness suprapubically with full feeling bladder Genitalia:  some external injection of labia minora no excoriation scant white d/c Msk:  no CVA tenderness  Extremities:  No clubbing, cyanosis,  edema, or deformity noted with normal full range of motion of all joints.   Neurologic:  sensation intact to light touch, gait normal, and DTRs symmetrical and normal.   Skin:  Intact without suspicious lesions or rashes Cervical Nodes:  No lymphadenopathy noted Inguinal Nodes:  No significant adenopathy Psych:  normal affect, talkative and pleasant    Impression & Recommendations:  Problem # 1:  VAGINITIS, CANDIDAL (ICD-112.1) Assessment New  will tx with diflucan and can use monistat externally as needed  work on getting sugars down update if not imp Her updated medication list for this problem includes:    Diflucan 150 Mg Tabs (Fluconazole) .Marland Kitchen... 1 by mouth times one for yeast infection  Orders: UA Dipstick W/ Micro (manual) (81000) Wet Prep (86767MC) Glucose, (CBG) (94709)  Problem # 2:  DIAB W/NEURO MANIFESTS TYPE II/UNS NOT UNCNTRL (ICD-250.60) Assessment: Deteriorated much worse with denial and lack of monitoring/ poor diet/ noncompliance with med  will get back on track  sugar 291 today check sugar two times a day on med and diet  f/u Dr Diona Browner 1 mo with sugar record high sugar is reason for frequent urination/ thirst and full bladder The following medications were removed from the medication list:    Metformin Hcl 500 Mg Tb24 (Metformin hcl) .Marland Kitchen... Take 2 by mouth every am and 2 by mouth every pm Her updated medication list for this problem includes:    Aspirin Childrens 81 Mg Chew (Aspirin) .Marland Kitchen... Take 1 tablet by mouth once a day    Lantus Solostar 100 Unit/ml Soln (Insulin glargine) .Marland KitchenMarland KitchenMarland KitchenMarland Kitchen 80 units once daily  Complete Medication List: 1)  Metoprolol Tartrate 50 Mg Tabs (Metoprolol tartrate) .... Take 1 tablet by mouth twice a day 2)  Aspirin Childrens 81 Mg Chew (Aspirin) .... Take 1 tablet by mouth once a day 3)  Nexium 40 Mg Cpdr (Esomeprazole magnesium) .... Take 1 tablet by mouth once a day 4)  B Complex-b12 Tabs (B complex vitamins) .... Patient states it has  folate and she takes it once a day, sublingually. 5)  Nasacort Aq Aers (Triamcinolone acetonide(nasal) aers) .... Use 2 puffs once a day as needed 6)  Lasix 20 Mg Tabs (Furosemide) .... One daily. 7)  Onetouch Ultra Test Strp (Glucose blood) .... Use as directed 8)  Freestyle Lite Strp (Glucose blood) .... Useto test dauly  as directed 9)  Vitamin D 50000 Unit Caps (Ergocalciferol) .Marland Kitchen.. 1 tablet every 2 weeks 10)  Cpap 6 Cwp Advanced  .... Use as directed 11)  Lantus Solostar 100 Unit/ml Soln (Insulin glargine) .... 80 units once daily 12)  Temazepam 15 Mg Caps (Temazepam) .Marland Kitchen.. 1 or 2 for sleep if needed. 13)  Fish Oil Oil (Fish oil) .... 2000 to 4000 mg daily 14)  Mobic 7.5 Mg Tabs (Meloxicam) .Marland Kitchen.. 1-2 tab by mouth daily as needed pain 15)  Diflucan 150 Mg Tabs (Fluconazole) .Marland Kitchen.. 1 by mouth times one for yeast infection  Patient Instructions: 1)  start checking your sugar two times a day -- fasting and 2 hours after a meal  2)  follow Diabetic diet  3)  get back on your lantus regulalry  4)  follow up with Dr Diona Browner in about a month with your sugar readings  5)  take the diflucan for yeast infection 6)  ok to use monistat externally for irritation  7)  update me if not improved in 3-4 days  Prescriptions: DIFLUCAN 150 MG TABS (FLUCONAZOLE) 1 by mouth times one for yeast infection  #1 x 0   Entered and Authorized by:   Allena Earing MD   Signed by:   Allena Earing MD on 03/07/2010   Method used:   [facility]  [address] [id number]       [address]       [place], [place]  [address]       Ph: [phone]       Fax: [phone]   RxID:   [id number]   Current Allergies (reviewed today): ! * IV DYE ! EPINEPHRINE HCL ERYTHROMYCIN ETHYLSUCCINATE * MORPHINE AND RELATED GROUP  Laboratory Results   Urine Tests  Date/Time Received: March 07, 2010 3:30 PM  Date/Time Reported: March 07, 2010 3:30 PM   Routine  Urinalysis   Color: yellow Appearance: Clear Glucose: >=1000   (Normal Range: Negative) Bilirubin: negative   (Normal Range: Negative) Ketone: negative   (Normal Range: Negative) Spec. Gravity: 1.020   (Normal Range: 1.003-1.035) Blood: trace-intact   (Normal Range: Negative) pH: 5.0   (Normal Range: 5.0-8.0) Protein: trace   (Normal Range: Negative) Urobilinogen: 0.2   (Normal Range: 0-1) Nitrite: negative   (Normal Range: Negative) Leukocyte Esterace: negative   (Normal Range: Negative)  Urine Microscopic WBC/HPF: 1-2 RBC/HPF: 0 Bacteria/HPF: 0 Mucous/HPF: few Epithelial/HPF: 0-1 Crystals/HPF: few Casts/LPF: 0 Yeast/HPF: 0 Other: 0     Blood Tests     Glucose (random): 291 mg/dL   (Normal Range: 70-105)    Wet Mount Source: vaginal  WBC/hpf: 5-10 Bacteria/hpf: rare  Rods Clue cells/hpf: none  Negative whiff Yeast/hpf: moderate Wet Mount KOH: 1+ Trichomonas/hpf: none    Laboratory Results   Urine Tests    Routine Urinalysis   Color: yellow Appearance: Clear Glucose: >=1000   (Normal Range: Negative) Bilirubin: negative   (Normal Range: Negative) Ketone: negative   (Normal Range: Negative) Spec. Gravity: 1.020   (Normal Range: 1.003-1.035) Blood: trace-intact   (Normal Range: Negative) pH: 5.0   (Normal Range: 5.0-8.0) Protein: trace   (Normal Range: Negative) Urobilinogen: 0.2   (Normal Range: 0-1) Nitrite: negative   (Normal Range: Negative) Leukocyte Esterace: negative   (Normal Range: Negative)  Urine Microscopic WBC/hpf: 1-2 RBC/hpf: 0 Bacteria: 0 Mucous: few Epithelial: 0-1 Crystals/LPF: few Casts/LPF: 0 Yeast/HPF: 0 Other: 0     Blood Tests     Glucose (random): 291 mg/dL   (Normal Range: 70-105)    Wet Mount/KOH  Rods  Negative whiff KOH 1+

## 2010-08-21 NOTE — Assessment & Plan Note (Signed)
Summary: U/A AND CULTURE PER DR Lakysha Kossman/[place]  Medications Added CIPRO 250 MG TABS (CIPROFLOXACIN HCL) 1 by mouth two times a day for 5 days       Nurse Visit   Allergies: 1)  ! * Iv Dye 2)  ! Epinephrine Hcl 3)  Erythromycin Ethylsuccinate 4)  * Morphine and Related Group Laboratory Results   Urine Tests  Date/Time Received: March 28, 2010 9:46 AM  Date/Time Reported: March 28, 2010 9:46 AM   Routine Urinalysis   Color: yellow Appearance: Hazy Glucose: negative   (Normal Range: Negative) Bilirubin: negative   (Normal Range: Negative) Ketone: negative   (Normal Range: Negative) Spec. Gravity: 1.010   (Normal Range: 1.003-1.035) Blood: moderate   (Normal Range: Negative) pH: 6.0   (Normal Range: 5.0-8.0) Protein: trace   (Normal Range: Negative) Urobilinogen: 0.2   (Normal Range: 0-1) Nitrite: positive   (Normal Range: Negative) Leukocyte Esterace: trace   (Normal Range: Negative)  Urine Microscopic WBC/HPF: 2-3 RBC/HPF: 0-1 Bacteria/HPF: few Mucous/HPF: few Epithelial/HPF: 1-3 Crystals/HPF: few Casts/LPF: 0 Yeast/HPF: 0 Other: 0    Comments: Pt said had pain and pressure upon urination with frequency of urine. Also feeling of urgency when has to urinate. Pt has been taking Cystex OTC. [facility] and can be reached at home # [phone] or cell [phone]. I have drawn up urine for a culture and when you have time if you could print order for culture. Thank you.Please advise. Ozzie Hoyle LPN  March 28, 2702 9:49 AM   urine looks more positive this time , please culture it  call in cipro--px written on EMR for call in  update if not improved     Orders Added: 1)  T-Culture, Urine [[id number]] 2)  Est. Patient Level I [93716] 3)  UA Dipstick W/ Micro (manual) [81000] 4)  Specimen Handling [99000] Prescriptions: CIPRO 250 MG TABS (CIPROFLOXACIN HCL) 1 by mouth two times a day for 5 days  #10 x 0   Entered and Authorized by:   Allena Earing MD   Signed by:   Ozzie Hoyle LPN on 96/78/9381   Method used:   [facility]phoned to ...       [facility]  [address] [id number]       [address]       [place], [place]  [address]       Ph: [phone]       Fax: [phone]   RxID:   [id number]   Patient notified as instructed by telephone. [facility]ication [facility] as instructed. Urine sent for culture as instructed.Ozzie Hoyle LPN  March 29, 4579 11:12 AM

## 2010-08-21 NOTE — Assessment & Plan Note (Signed)
Summary: 3 M F/U 30 MIN/DLO   Vital Signs:  Patient profile:   65 year old female Height:      67 inches Weight:      234.2 pounds BMI:     36.81 Temp:     98.2 degrees F oral Pulse rate:   80 / minute Pulse rhythm:   regular BP sitting:   120 / 68  (left arm) Cuff size:   large  Vitals Entered By: Zenda Alpers CMA Deborra Medina) (July 25, 2009 3:45 PM) CC: 3 month follow up  Is Patient Diabetic? Yes Did you bring your meter with you today? No   History of Present Illness: DM, inadequate control..A1C increased some.. FBS..rarely checking. Not eating well, not exercising.  Water aerobics and cardio at [facility] planned.   Soreness in feet, hips, toes,low back, ankles when starts walking..improves after walking around for a few minutes. Ongoing x severeal months. Occ takes aleve.   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) 5)  Polyneuropathy in Diabetes  (ICD-357.2) 6)  Knee Pain, Right, Acute  (ICD-719.46) 7)  Diab W/neuro Manifests Type Ii/uns Not Uncntrl  (ICD-250.60) 8)  Symptom, Edema  (ICD-782.3) 9)  Symptom, Dysuria  (ICD-788.1) 10)  Disorder, Liver Nec  (ICD-573.8) 11)  Disorder, Depressive Nec  (ICD-311) 12)  Tubulovillous Adenoma, Colon  (ICD-211.3) 13)  Sleep Apnea  (ICD-780.57) 14)  Obesity  (ICD-278.00) 15)  Hypercholesterolemia  (ICD-272.0) 16)  Urinary Incontinence  (ICD-788.30) 17)  Supraventricular Tachycardia  (ICD-427.89) 18)  Ibs  (ICD-564.1) 19)  Diverticulosis, Colon  (ICD-562.10) 20)  Anxiety Depression  (ICD-300.4) 21)  Tremor, Essential  (ICD-333.1) 22)  Gerd  (ICD-530.81) 23)  Allergic Rhinitis  (ICD-477.9) 24)  Diabetes Mellitus, Type II  (ICD-250.00)  Current Medications (verified): 1)  Metoprolol Tartrate 50 Mg Tabs (Metoprolol Tartrate) .... Take 1 Tablet By Mouth Twice A Day 2)  Aspirin  Childrens 81 Mg Chew (Aspirin) .... Take 1 Tablet By Mouth Once A Day 3)  Nexium 40 Mg Cpdr (Esomeprazole Magnesium) .... Take 1 Tablet By Mouth Once A Day 4)  B Complex-B12  Tabs (B Complex Vitamins) .... Patient States It Has Folate and She Takes It Once A Day, Sublingually. 5)  Nasacort Aq  Aers (Triamcinolone Acetonide(Nasal) Aers) .... Use 2 Puffs Once A Day Prn 6)  Lasix 20 Mg Tabs (Furosemide) .... One Daily. 7)  Metformin Hcl 500 Mg  Tb24 (Metformin Hcl) .... Take 2 By Mouth Every Am and 2 By Mouth Every Pm 8)  Onetouch Ultra Test   Strp (Glucose Blood) .... Use As Directed 9)  Freestyle Lite   Strp (Glucose Blood) .... Useto Test Dauly  As Directed 10)  Vitamin D 50000 Unit  Caps (Ergocalciferol) .Marland Kitchen.. 1 Tablet Every 2 Weeks 11)  Selenium 200 Mcg  Caps (Selenium) .... Take 1 Capsule By Mouth Once A Day 12)  Lecithin 1200 Mg  Caps (Lecithin) .... Take 1 Tablet By Mouth Once A Day 13)  Crommax 533mg .... Take 1 Tablet By Mouth Once A Day 14)  Cpap 6 Cwp Advanced .... Use As Directed 15)  Lantus Solostar 100 Unit/ml Soln (Insulin Glargine) .... 68 Units Once Daily 16)  Temazepam 15 Mg Caps (Temazepam) ..Marland Kitchen. 1 or 2 For Sleep If Needed. 17)  Fish Oil  Oil (Fish Oil) .... 2000 To 4000 Mg  Daily 18)  Mobic 7.5 Mg Tabs (Meloxicam) .Marland Kitchen.. 1-2 Tab By Mouth Daily As Needed Pain  Allergies: 1)  ! * Iv Dye 2)  ! Epinephrine Hcl 3)  Erythromycin Ethylsuccinate 4)  * Morphine and Related Group  Past History:  Past medical, surgical, family and social histories (including risk factors) reviewed, and no changes noted (except as noted below).  Past Medical History: Reviewed history from 02/29/2008 and no changes required. 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) KNEE PAIN, RIGHT, ACUTE (ICD-719.46) DIAB W/NEURO MANIFESTS TYPE II/UNS NOT UNCNTRL (ICD-250.60) SYMPTOM, EDEMA  (ICD-782.3) SYMPTOM, DYSURIA (ICD-788.1) DISORDER, LIVER NEC (ICD-573.8) ABSCESS, TOOTH (ICD-522.5) DISORDER, DEPRESSIVE NEC (ICD-311) TUBULOVILLOUS ADENOMA, COLON (ICD-211.3) SLEEP APNEA (ICD-780.57) OBESITY (ICD-278.00) HYPERCHOLESTEROLEMIA (ICD-272.0) URINARY INCONTINENCE (ICD-788.30) SUPRAVENTRICULAR TACHYCARDIA (ICD-427.89) IBS (ICD-564.1) DIVERTICULOSIS, COLON (ICD-562.10) ANXIETY DEPRESSION (ICD-300.4) TREMOR, ESSENTIAL (ICD-333.1) GERD (ICD-530.81) ALLERGIC RHINITIS (ICD-477.9) DIABETES MELLITUS, TYPE II (ICD-250.00)    Past Surgical History: Reviewed history from 12/30/2006 and no changes required. CT of Abd. 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 Father died cirrhosis of liver mother still living at 17 diabetes in 2 aunts allergies in "whole family" mother has clotting disorders grandmother had cancer 3 siblings all in good health  Social History: Reviewed history from 02/29/2008 and no changes required. Patient never smoked.  no exercise 2 cups coffee a day married 5 children Husband treated for testicular cancer  Review of Systems General:  Complains of fatigue; denies fever. CV:  Denies chest pain or discomfort. Resp:  Denies shortness of breath. GI:  Denies abdominal pain. GU:  Denies dysuria. MS:  Complains of joint pain, low back pain, and cramps; denies joint redness, joint swelling, and muscle weakness. Derm:  Denies rash.   Impression & Recommendations:  Problem # 1:  PAIN IN JOINT, MULTIPLE SITES (ICD-719.49) Likely due to osteoarthritis and obesity as well as lack on exercise. No clear sign of PMR or  rheumatoid arthritis, but could consider workup if not improving.  Recommended weight loss, exercise low impact, gentle strtching prior and following.  Use mobic as needed.   Problem # 2:  DIABETES MELLITUS, TYPE II (ICD-250.00) Very poor control.  Increase lantus 3 Units, stop glipizide (doubt helping at all).  Spent 20 min face to face conseling on use of insulin and diet changes. She recognizes she needs to be more compliant with lifestyle. She will return with records of CBGs in 1 omnth.Marland Kitchenat that time will consider adding mealtime rapid acting insulin if postprandials above 200.  The following medications were removed from the medication list:    Glipizide Xl 10 Mg Xr24h-tab (Glipizide) .Marland Kitchen... 1 tab by mouth prior to breakfast, then 1 tab prior to lunch. follow cbgs closely. Her updated medication list for this problem includes:    Aspirin Childrens 81 Mg Chew (Aspirin) .Marland Kitchen... Take 1 tablet by mouth once a day    Metformin Hcl 500 Mg Tb24 (Metformin hcl) .Marland Kitchen... Take 2 by mouth every am and 2 by mouth every pm    Lantus Solostar 100 Unit/ml Soln (Insulin glargine) .Marland KitchenMarland KitchenMarland KitchenMarland Kitchen 68 units once daily  Complete Medication List: 1)  Metoprolol Tartrate 50 Mg Tabs (Metoprolol tartrate) .... Take 1  tablet by mouth twice a day 2)  Aspirin Childrens 81 Mg Chew (Aspirin) .... Take 1 tablet by mouth once a day 3)  Nexium 40 Mg Cpdr (Esomeprazole magnesium) .... Take 1 tablet by mouth once a day 4)  B Complex-b12 Tabs (B complex vitamins) .... Patient states it has folate and she takes it once a day, sublingually. 5)  Nasacort Aq Aers (Triamcinolone acetonide(nasal) aers) .... Use 2 puffs once a day prn 6)  Lasix 20 Mg Tabs (Furosemide) .... One daily. 7)  Metformin Hcl 500 Mg Tb24 (Metformin hcl) .... Take 2 by mouth every am and 2 by mouth every pm 8)  Onetouch Ultra Test Strp (Glucose blood) .... Use as directed 9)  Freestyle Lite Strp (Glucose blood) .... Useto test dauly  as directed 10)  Vitamin D 50000 Unit  Caps (Ergocalciferol) .Marland Kitchen.. 1 tablet every 2 weeks 11)  Selenium 200 Mcg Caps (Selenium) .... Take 1 capsule by mouth once a day 12)  Lecithin 1200 Mg Caps (Lecithin) .... Take 1 tablet by mouth once a day 13)  Crommax 571mg  .... Take 1 tablet by mouth once a day 14)  Cpap 6 Cwp Advanced  .... Use as directed 15)  Lantus Solostar 100 Unit/ml Soln (Insulin glargine) .... 68 units once daily 16)  Temazepam 15 Mg Caps (Temazepam) ..Marland Kitchen. 1 or 2 for sleep if needed. 17)  Fish Oil Oil (Fish oil) .... 2000 to 4000 mg daily 18)  Mobic 7.5 Mg Tabs (Meloxicam) ..Marland Kitchen. 1-2 tab by mouth daily as needed pain  Patient Instructions: 1)  Stop glipizide. 2)  Increase Lantus to 3 Units in AMs 3)  Check blood sugars every morning fasting, and try to check once daily 2 hours after meals 4)  Follow up in 1 month. for DM, 30 min OV. 5)  Bring in list of blood sugars. 6)  A1C and lipids, CMET in 3 months.  7)  May use mobic for joint pain. Stop if nausea. Prescriptions: MOBIC 7.5 MG TABS (MELOXICAM) 1-2 tab by mouth daily as needed pain  #60 x 3   Entered and Authorized by:   AEliezer LoftsMD   Signed by:   AEliezer LoftsMD on 07/25/2009   Method used:   Electronically to        [phone] (retail)       [address]      [place]  [address]      Ph: [phone]      Fax: [phone]  RxID:   [id number]  Current Allergies (reviewed today): ! * IV DYE ! EPINEPHRINE HCL ERYTHROMYCIN ETHYLSUCCINATE * MORPHINE AND RELATED GROUP

## 2010-08-21 NOTE — Progress Notes (Signed)
Summary: wants to leave urine sample  Phone Note Call from Patient Call back at Home Phone [phone] Call back at [phone]   Caller: Patient Summary of Call: Pt states she has a UTI, has pain and burning, and she is asking if she can come in this afternoon and leave urine sample. Initial call taken by: Marty Heck CMA,  March 05, 2010 11:37 AM  Follow-up for Phone Call        This is against my practice pattern, recommend office visit if anyone has openings. Follow-up by: Owens Loffler MD,  March 05, 2010 1:45 PM  Additional Follow-up for Phone Call Additional follow up Details #1::        Advised pt, she was not happy, hung the phone up. Additional Follow-up by: Marty Heck CMA,  March 05, 2010 2:46 PM    Additional Follow-up for Phone Call Additional follow up Details #2::    Noted. I still do not have patients leave urine samples without examining. Never have.  Will forward to Dr. Diona Browner so she will be aware. Follow-up by: Owens Loffler MD,  March 05, 2010 3:01 PM  Additional Follow-up for Phone Call Additional follow up Details #3:: Details for Additional Follow-up Action Taken: pt is diabetic, complicated medical history. It is poor care to simply take urine sample without exam..Marland KitchenI agree with Dr. Posey Pronto recommendations for OV.  Please attempt to call pt and have her add on tommorow or be seen at [facility] today.   Patient advised and appt made.Cathlean Cower CMA   Additional Follow-up by: Eliezer Lofts MD,  March 05, 2010 3:53 PM

## 2010-08-21 NOTE — Progress Notes (Signed)
  Phone Note Call from Patient   Summary of Call: Patient had labs  today, she asked me to tell you she is off all diabetic medication except Lantus. She is waiting now to see how her labs are. She says she feels good. Initial call taken by: Selinda Orion,  October 18, 2009 10:59 AM

## 2010-08-23 NOTE — Progress Notes (Signed)
Summary: temazepam   Phone Note Refill Request Message from:  Fax from Pharmacy on August 06, 2010 1:34 PM  Refills Requested: Medication #1:  TEMAZEPAM 15 MG CAPS 1 or 2 for sleep if needed.   Last Refilled: 04/06/2009 Refill request from [facility] [address] [phone].   Initial call taken by: Lacretia Nicks,  August 06, 2010 1:35 PM  Follow-up for Phone Call        OVer due for CPX and DM follow up.. please have her schedule. No refills after this one until appt scheduled.  Follow-up by: Eliezer Lofts MD,  August 06, 2010 1:47 PM  Additional Follow-up for Phone Call Additional follow up Details #1::        Rx called to pharmacy and patient made appt for 08-20-2010 at 10:45am for cpx Additional Follow-up by: Zenda Alpers CMA Deborra Medina),  August 06, 2010 2:33 PM    Prescriptions: TEMAZEPAM 15 MG CAPS (TEMAZEPAM) 1 or 2 for sleep if needed.  #30 x 0   Entered and Authorized by:   Eliezer Lofts MD   Signed by:   Eliezer Lofts MD on 08/06/2010   Method used:   Telephoned to ...       [id number]       [address]       [place], [place]  [address]       Ph: [phone]       Fax: [phone]   RxID:   [id number]

## 2010-08-24 ENCOUNTER — Ambulatory Visit (INDEPENDENT_AMBULATORY_CARE_PROVIDER_SITE_OTHER)
Admission: RE | Admit: 2010-08-24 | Discharge: 2010-08-24 | Disposition: A | Discharge status: Home / Self Care | Payer: 59 | Source: Ambulatory Visit | Attending: Family Medicine | Admitting: Family Medicine

## 2010-08-24 ENCOUNTER — Other Ambulatory Visit: Payer: Self-pay | Admitting: Family Medicine

## 2010-08-24 DIAGNOSIS — K863 Pseudocyst of pancreas: Secondary | ICD-10-CM

## 2010-08-24 DIAGNOSIS — J984 Other disorders of lung: Secondary | ICD-10-CM

## 2010-08-24 DIAGNOSIS — R911 Solitary pulmonary nodule: Secondary | ICD-10-CM

## 2010-08-24 DIAGNOSIS — K862 Cyst of pancreas: Secondary | ICD-10-CM

## 2010-08-28 ENCOUNTER — Ambulatory Visit
Admission: RE | Admit: 2010-08-28 | Discharge: 2010-08-28 | Disposition: A | Discharge status: Home / Self Care | Payer: 59 | Source: Ambulatory Visit | Attending: Family Medicine | Admitting: Family Medicine

## 2010-08-28 DIAGNOSIS — Z1239 Encounter for other screening for malignant neoplasm of breast: Secondary | ICD-10-CM

## 2010-08-29 NOTE — Assessment & Plan Note (Signed)
Summary: cpx/hmw   Vital Signs:  Patient profile:   65 year old female Height:      67 inches Weight:      214.0 pounds BMI:     33.64 Temp:     98.1 degrees F oral Pulse rate:   68 / minute Pulse rhythm:   regular BP sitting:   130 / 70  (left arm) Cuff size:   large  Vitals Entered By: Zenda Alpers CMA Deborra Medina) (August 20, 2010 10:42 AM)  History of Present Illness: Chief complaint cpx  Pt has not been seen in over 1 year. Over due for chol, DM lab eval, etc.   DM, very poor control. FBS  running 279- 400s Using lantus 80 units daily. Forgets to take a lot. Doing moderately well on diet.   No low blood sugars.  Cannot tolerated metformin due to diarrhea.  Has lost 15 lbs in last year.   In last 2 year redness, tenderness around bases of fingernails. Bumpy in appearance. dry cracking around nails. Nails themselves look great. Seen derm .. told possible warts.. treated with cryptherapy. Improved some with freezing, but came right back.   Overdue for CT eval of lung nodule and pancreas pseudocyst. Will foward to Dr. Annamaria Boots and Dr. Olevia Perches if needed.  Depression, well controlled. Married again.   Will do DXA next year.. moderate risk. Mother with osteoporosis at age 35  Dyspepsia History:      There is a prior history of GERD.    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) 9)  Knee Pain, Right, Acute  (ICD-719.46) 10)  Diab W/neuro Manifests Type Ii/uns Not Uncntrl  (ICD-250.60) 11)  Symptom, Edema  (ICD-782.3) 12)  Symptom, Dysuria  (ICD-788.1) 13)  Disorder, Liver Nec  (ICD-573.8) 14)  Disorder, Depressive Nec  (ICD-311) 15)  Tubulovillous Adenoma, Colon  (ICD-211.3) 16)   Sleep Apnea  (ICD-780.57) 17)  Obesity  (ICD-278.00) 18)  Hypercholesterolemia  (ICD-272.0) 19)  Urinary Incontinence  (ICD-788.30) 20)  Supraventricular Tachycardia  (ICD-427.89) 21)  Ibs  (ICD-564.1) 22)  Diverticulosis, Colon  (ICD-562.10) 23)  Anxiety Depression  (ICD-300.4) 24)  Tremor, Essential  (ICD-333.1) 25)  Gerd  (ICD-530.81) 26)  Allergic Rhinitis  (ICD-477.9) 27)  Diabetes Mellitus, Type II  (ICD-250.00)  Current Medications (verified): 1)  Metoprolol Tartrate 50 Mg Tabs (Metoprolol Tartrate) .... Take 1 Tablet By Mouth Twice A Day 2)  Aspirin Childrens 81 Mg Chew (Aspirin) .... Take 1 Tablet By Mouth Once A Day 3)  Nexium 40 Mg Cpdr (Esomeprazole Magnesium) .... Take 1 Tablet By Mouth Once A Day 4)  Nasacort Aq  Aers (Triamcinolone Acetonide(Nasal) Aers) .... Use 2 Puffs Once A Day As Needed 5)  Lasix 20 Mg Tabs (Furosemide) .... One Daily. 6)  Onetouch Ultra Test   Strp (Glucose Blood) .... Use As Directed 7)  Freestyle Lite   Strp (Glucose Blood) .... Useto Test Dauly  As Directed 8)  Cpap 6 Cwp Advanced .... Use As Directed 9)  Lantus Solostar 100 Unit/ml Soln (Insulin Glargine) .... 45 Units Two Times A Day 10)  Temazepam 15 Mg Caps (Temazepam) .Marland Kitchen.. 1 or 2 For Sleep If Needed. 11)  Betamethasone  Dipropionate 0.05 % Crea (Betamethasone Dipropionate) .... Aaa Two Times A Day X  Weeks  Allergies: 1)  ! * Iv Dye 2)  ! Epinephrine Hcl 3)  Erythromycin Ethylsuccinate 4)  * Morphine and Related Group  Past History:  Past medical, surgical, family and social histories (including risk factors) reviewed, and no changes noted (except as noted below).  Past Medical History: Reviewed history from 02/29/2008 and no changes required. 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) KNEE PAIN, RIGHT, ACUTE (ICD-719.46) DIAB W/NEURO MANIFESTS TYPE II/UNS NOT  UNCNTRL (ICD-250.60) SYMPTOM, EDEMA (ICD-782.3) SYMPTOM, DYSURIA (ICD-788.1) DISORDER, LIVER NEC (ICD-573.8) ABSCESS, TOOTH (ICD-522.5) DISORDER, DEPRESSIVE NEC (ICD-311) TUBULOVILLOUS ADENOMA, COLON (ICD-211.3) SLEEP APNEA (ICD-780.57) OBESITY (ICD-278.00) HYPERCHOLESTEROLEMIA (ICD-272.0) URINARY INCONTINENCE (ICD-788.30) SUPRAVENTRICULAR TACHYCARDIA (ICD-427.89) IBS (ICD-564.1) DIVERTICULOSIS, COLON (ICD-562.10) ANXIETY DEPRESSION (ICD-300.4) TREMOR, ESSENTIAL (ICD-333.1) GERD (ICD-530.81) ALLERGIC RHINITIS (ICD-477.9) DIABETES MELLITUS, TYPE II (ICD-250.00)    Past Surgical History: Reviewed history from 12/30/2006 and no changes required. CT of Abd. 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 Father died cirrhosis of liver mother still living at 73 diabetes in 2 aunts allergies in "whole family" mother has clotting disorders grandmother had cancer 3 siblings all in good health  Social History: Reviewed history from 02/29/2008 and no changes required. Patient never smoked.  no exercise 2 cups coffee a day married 5 children Husband treated for testicular cancer  Review of Systems General:  Complains of fatigue; denies fever. CV:  Denies chest pain or discomfort. Resp:  Denies shortness of breath. GI:  chronic mild abdominal pain . GU:  Denies dysuria. Psych:  Denies anxiety and suicidal thoughts/plans.  Physical Exam  General:  overweight female in NAD  Eyes:  No corneal or conjunctival inflammation noted. EOMI. Perrla. Funduscopic exam benign, without hemorrhages, exudates or papilledema. Vision grossly normal. Ears:  External ear  exam shows no significant lesions or deformities.  Otoscopic examination reveals clear canals, tympanic membranes are intact bilaterally without bulging, retraction, inflammation or discharge. Hearing is grossly normal bilaterally. Nose:  External nasal examination shows no deformity or inflammation. Nasal mucosa are pink and moist without lesions or exudates. Mouth:  MMM Neck:  no carotid bruit or thyromegaly no cervical or supraclavicular lymphadenopathy  Chest Wall:  No deformities, masses, or tenderness noted. Breasts:  No mass, nodules, thickening, tenderness, bulging, retraction, inflamation, nipple discharge or skin changes noted.   Lungs:  Normal respiratory effort, chest expands symmetrically. Lungs are clear to auscultation, no crackles or wheezes. Heart:  Normal rate and regular rhythm. S1 and S2 normal without gallop, murmur, click, rub or other extra sounds. Abdomen:  diffuse mild ttp, soft, normal bowel sounds, no masses, no guarding, no rigidity, no rebound tenderness, and no splenomegaly.   Genitalia:  refused DVE Pulses:  R and L posterior tibial pulses are full and equal bilaterally  Extremities:  No clubbing, cyanosis, edema, or deformity noted with normal full range of motion of all joints.   Skin:  warty, looking lesions with surrpounding erythema, cracking skin  at nail edges all finger.  Waryt on right thumb  Diabetes Management Exam:    Foot Exam (with socks and/or shoes not present):  Sensory-Pinprick/Light touch:          Left medial foot (L-4): normal          Left dorsal foot (L-5): normal          Left lateral foot (S-1): normal          Right medial foot (L-4): normal          Right dorsal foot (L-5): normal          Right lateral foot (S-1): normal       Sensory-Monofilament:          Left foot: diminished          Right foot: diminished       Inspection:          Left foot: normal          Right foot: normal       Nails:          Left foot: normal           Right foot: normal   Impression & Recommendations:  Problem # 1:  WELL WOMAN (ICD-V70.0) The patient's preventative maintenance and recommended screening tests for an annual wellness exam were reviewed in full today. Brought up to date unless services declined.  Counselled on the importance of diet, exercise, and its role in overall health and mortality. The patient's FH and SH was reviewed, including their home life, tobacco status, and drug and alcohol status.    Will hols on DEXA this year as multiple tests pending.   Problem # 2:  SKIN RASH (ICD-782.1) Likely verrucus. No suggesyion of fungal infection. treat inflamation with  steroid cream... likely will need to return to derm for other treatment suggestions.. possible repeat cryotherapy.  Her updated medication list for this problem includes:    Betamethasone Dipropionate 0.05 % Crea (Betamethasone dipropionate) .Marland Kitchen... Aaa two times a day x  weeks  Orders: Wet Prep (49702OV)  Problem # 3:  LUNG NODULE (ICD-518.89) Overdue for repeat CT scan Orders: Radiology Referral (Radiology)  Problem # 4:  CYST AND PSEUDOCYST OF PANCREAS (ICD-577.2) Overdue for repeat CT scan Orders: Radiology Referral (Radiology)  Problem # 5:  HYPERCHOLESTEROLEMIA (ICD-272.0)  Due for reeval.  Labs Reviewed: SGOT: 32 (10/18/2009)   SGPT: 32 (10/18/2009)  Lipid Goals: Chol Goal: 200 (09/21/2008)   HDL Goal: 40 (09/21/2008)   LDL Goal: 100 (09/21/2008)   TG Goal: 150 (09/21/2008)  Prior 10 Yr Risk Heart Disease: 20 % (09/21/2008)   HDL:38.70 (10/18/2009), 34.10 (04/07/2009)  LDL:92 (10/18/2009), 108 (04/07/2009)  Chol:153 (10/18/2009), 166 (04/07/2009)  Trig:112.0 (10/18/2009), 118.0 (04/07/2009)  Problem # 6:  DIABETES MELLITUS, TYPE II (ICD-250.00) Very poor control. Increase lantus by 10 points .. split dose for pt ease. Follow CBGs closely.. call with measurements in next few weeks.  Her updated medication list for this problem  includes:    Aspirin Childrens 81 Mg Chew (Aspirin) .Marland Kitchen... Take 1 tablet by mouth once a day    Lantus Solostar 100 Unit/ml Soln (Insulin glargine) .Marland KitchenMarland KitchenMarland KitchenMarland Kitchen 45 units two times a day  Orders: TLB-BMP (Basic Metabolic Panel-BMET) (78588-FOYDXAJ) TLB-Hepatic/Liver Function Pnl (80076-HEPATIC) TLB-Lipid Panel (80061-LIPID) TLB-A1C / Hgb A1C (Glycohemoglobin) (83036-A1C)  Complete Medication List: 1)  Metoprolol Tartrate 50 Mg Tabs (Metoprolol tartrate) .... Take 1 tablet by mouth twice a day 2)  Aspirin Childrens 81 Mg Chew (Aspirin) .... Take 1 tablet by mouth once a day 3)  Nexium 40 Mg Cpdr (Esomeprazole magnesium) .... Take 1 tablet by  mouth once a day 4)  Nasacort Aq Aers (Triamcinolone acetonide(nasal) aers) .... Use 2 puffs once a day as needed 5)  Lasix 20 Mg Tabs (Furosemide) .... One daily. 6)  Onetouch Ultra Test Strp (Glucose blood) .... Use as directed 7)  Freestyle Lite Strp (Glucose blood) .... Useto test dauly  as directed 8)  Cpap 6 Cwp Advanced  .... Use as directed 9)  Lantus Solostar 100 Unit/ml Soln (Insulin glargine) .... 45 units two times a day 10)  Temazepam 15 Mg Caps (Temazepam) .Marland Kitchen.. 1 or 2 for sleep if needed. 11)  Betamethasone Dipropionate 0.05 % Crea (Betamethasone dipropionate) .... Aaa two times a day x  weeks   Patient Instructions: 1)  Increase lantus to 45 units two times a day. 2)  Goal blood sugar fasting.. is <120.  3)   Call with blood sugars fasting in the next 1 week. 4)  Please schedule a follow-up appointment in 3 months .  5)  Apply betamethasone to nail edges.. if not improving .Marland Kitchen.follow up with derm.  6)  HgBA1c prior to visit  ICD-9: 250.00 7)  Referral Appointment Information 8)  Day/Date: 9)  Time: 10)  Place/MD: 11)  Address: 12)  Phone/Fax: 13)  Patient given appointment information. Information/Orders faxed/mailed.  Prescriptions: BETAMETHASONE DIPROPIONATE 0.05 % CREA (BETAMETHASONE DIPROPIONATE) AAA two times a day x  weeks  #15 gm  x 0   Entered and Authorized by:   Eliezer Lofts MD   Signed by:   Eliezer Lofts MD on 08/20/2010   Method used:   [facility]        [phone][facility]ail[facility]       [address]       [place], [place]  [address]       Ph: [phone]       Fax: [phone]   RxID:   [id number]    Orders Added: 1)  TLB-BMP (Basic Metabolic Panel-BMET) [87564-PPIRJJO] 2)  TLB-Hepatic/Liver Function Pnl [80076-HEPATIC] 3)  TLB-Lipid Panel [80061-LIPID] 4)  TLB-A1C / Hgb A1C (Glycohemoglobin) [83036-A1C] 5)  Radiology Referral [Radiology] 6)  Radiology Referral [Radiology] 7)  Wet Prep [84166AY] 3)  Est. Patient 40-64 years [01601] 9)  Est. Patient Level III [09323]    Current Allergies (reviewed today): ! * IV DYE ! EPINEPHRINE HCL ERYTHROMYCIN ETHYLSUCCINATE * MORPHINE AND RELATED GROUP  Last Flu Vaccine:  given (05/22/2008 12:10:24 PM) Flu Vaccine Next Due:  Refused Herpes Zoster Next Due:  Refused PAP Next Due:  Refused       Past Medical History:    Reviewed history from 02/29/2008 and no changes required:       ACUTE CYSTITIS (ICD-595.0)       SORE THROAT (ICD-462)       LUNG NODULE (ICD-518.89)       CYST AND PSEUDOCYST OF PANCREAS (ICD-577.2)       POLYNEUROPATHY IN DIABETES (ICD-357.2)       KNEE PAIN, RIGHT, ACUTE (ICD-719.46)       DIAB W/NEURO MANIFESTS TYPE II/UNS NOT UNCNTRL (ICD-250.60)       SYMPTOM, EDEMA (ICD-782.3)       SYMPTOM, DYSURIA (ICD-788.1)       DISORDER, LIVER NEC (ICD-573.8)       ABSCESS, TOOTH (ICD-522.5)       DISORDER, DEPRESSIVE NEC (ICD-311)       TUBULOVILLOUS ADENOMA, COLON (ICD-211.3)       SLEEP  APNEA (ICD-780.57)       OBESITY (ICD-278.00)       HYPERCHOLESTEROLEMIA (ICD-272.0)       URINARY INCONTINENCE (ICD-788.30)       SUPRAVENTRICULAR TACHYCARDIA (ICD-427.89)       IBS (ICD-564.1)       DIVERTICULOSIS, COLON (ICD-562.10)       ANXIETY DEPRESSION (ICD-300.4)       TREMOR, ESSENTIAL  (ICD-333.1)       GERD (ICD-530.81)       ALLERGIC RHINITIS (ICD-477.9)       DIABETES MELLITUS, TYPE II (ICD-250.00)          Past Surgical History:    Reviewed history from 12/30/2006 and no changes required:       CT of Abd. slt abn liver 10/07       Pancreas CT: cyst 04/2006       Chest CT; small thickening- unable to bx 10/07       EGD; cyst on pancreas 10/07       Shoulder surgery- rotater cuff 2005       Colonoscopy; diverticulosis       Kidney stone- removal 1980's       Lithotripsy- 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       Hysterectomy- partial 1982       Cholecystectomy 2000    Laboratory Results    Wet [place]/KOH Source: nail edges KOH Negative   Appended Document: Orders Update Medications Added FLUTICASONE PROPIONATE 50 MCG/ACT SUSP (FLUTICASONE PROPIONATE) 2 sprays per nostril daily          Clinical Lists Changes  Problems: Added new problem of UNSPECIFIED VITAMIN D DEFICIENCY (ICD-268.9) Medications: Changed medication from NASACORT AQ  AERS (TRIAMCINOLONE ACETONIDE(NASAL) AERS) Use 2 puffs once a day as needed to FLUTICASONE PROPIONATE 50 MCG/ACT SUSP (FLUTICASONE PROPIONATE) 2 sprays per nostril daily - Signed Rx of FLUTICASONE PROPIONATE 50 MCG/ACT SUSP (FLUTICASONE PROPIONATE) 2 sprays per nostril daily;  #1 x 11;  Signed;  Entered by: Eliezer Lofts MD;  Authorized by: Eliezer Lofts MD;  Method used: [phone][facility] [address] [place], [place], [place]  [address], Ph: [phone], Fax: [phone] Orders: Added new Test order of T-Vitamin D (25-Hydroxy) [phone]) - Signed    Prescriptions: FLUTICASONE PROPIONATE 50 MCG/ACT SUSP (FLUTICASONE PROPIONATE) 2 sprays per nostril daily  #1 x 11   Entered and Authorized by:   Eliezer Lofts MD   Signed by:   Eliezer Lofts MD on 08/20/2010   Method used:   Electronically to        [facility] [phone]* (retail)       [address]       [place],  [place]  [address]       Ph: [phone]       Fax: [phone]   RxID:   [id number]

## 2010-09-07 ENCOUNTER — Telehealth: Payer: Self-pay | Admitting: Family Medicine

## 2010-09-12 NOTE — Progress Notes (Signed)
Summary: lantus   Phone Note Call from Patient Call back at Home Phone [phone] P PH     Caller: Patient Call For: Eliezer Lofts MD Summary of Call: Patient says that she was told to call and let you know how she is using the lantus. She says that she takes 50 mg in am and 24m in the evening. She says that she will also need a new [facility]  Initial call taken by: ALacretia Nicks  September 07, 2010 1:06 PM    New/Updated Medications: LANTUS SOLOSTAR 100 UNIT/ML SOLN (INSULIN GLARGINE) 50 Units two times a day  Dx 250.00 Prescriptions: LANTUS SOLOSTAR 100 UNIT/ML SOLN (INSULIN GLARGINE) 50 Units two times a day  Dx 250.00  #3 boxes x 11   Entered and Authorized by:   AEliezer LoftsMD   Signed by:   AEliezer LoftsMD on 09/07/2010   Method used:   Electronically to        [facility][phone] (retail)       [address]      [place]  [address]      Ph: [phone]      Fax: [phone]  RxID::   [id number]  Appended Document: lantus  Script changed to a 3 month supply- 6 boxes with 3 refills ok'd to [facility].

## 2010-09-29 ENCOUNTER — Encounter: Payer: Self-pay | Admitting: Family Medicine

## 2010-10-03 ENCOUNTER — Telehealth: Payer: Self-pay | Admitting: Family Medicine

## 2010-10-09 NOTE — Progress Notes (Signed)
Summary: refill request for betamathasone  Phone Note Refill Request Call back at Home Phone [phone] [facility] [place]   Message from:  Patient  Refills Requested: Medication #1:  BETAMETHASONE DIPROPIONATE 0.05 % CREA AAA two times a day x  weeks Pt states she uses this on her fingers, her fingers are looking much better now and she wants to continue the medicine. [facility] [address]  Initial call taken by: Marty Heck CMA, AAMA,  October 03, 2010 11:24 AM    Prescriptions: BETAMETHASONE DIPROPIONATE 0.05 % CREA (BETAMETHASONE DIPROPIONATE) AAA two times a day x  weeks  #15 gm x 1   Entered and Authorized by:   Eliezer Lofts MD   Signed by:   Eliezer Lofts MD on 10/03/2010   Method used:   Electronically to        [facility] [phone]* (retail)       [address]       [place], [place]  [address]       Ph: [phone]       Fax: [phone]   RxID:   [id number]

## 2010-11-05 ENCOUNTER — Other Ambulatory Visit: Payer: Self-pay | Admitting: *Deleted

## 2010-11-05 DIAGNOSIS — E7800 Pure hypercholesterolemia, unspecified: Secondary | ICD-10-CM

## 2010-11-14 ENCOUNTER — Other Ambulatory Visit: Payer: Self-pay | Admitting: Family Medicine

## 2010-11-14 ENCOUNTER — Other Ambulatory Visit (INDEPENDENT_AMBULATORY_CARE_PROVIDER_SITE_OTHER): Payer: 59 | Admitting: Family Medicine

## 2010-11-14 DIAGNOSIS — E7800 Pure hypercholesterolemia, unspecified: Secondary | ICD-10-CM

## 2010-11-14 DIAGNOSIS — E119 Type 2 diabetes mellitus without complications: Secondary | ICD-10-CM

## 2010-11-14 LAB — ALT: ALT: 48 U/L — ABNORMAL HIGH (ref 0–35)

## 2010-11-14 LAB — HEMOGLOBIN A1C: Hgb A1c MFr Bld: 9.9 % — ABNORMAL HIGH (ref 4.6–6.5)

## 2010-11-14 LAB — LIPID PANEL: HDL: 46.9 mg/dL (ref >39.00)

## 2010-11-14 LAB — AST: AST: 41 U/L — ABNORMAL HIGH (ref 0–37)

## 2010-11-19 ENCOUNTER — Ambulatory Visit: Payer: Self-pay | Admitting: Family Medicine

## 2010-11-20 ENCOUNTER — Ambulatory Visit: Payer: Self-pay | Admitting: Family Medicine

## 2010-12-07 NOTE — Op Note (Signed)
   NAME:  [name] [name], [name] [name]                         ACCOUNT NO.:  [id number]   MEDICAL RECORD NO.:  [id number]                   PATIENT TYPE:  AMB   LOCATION:  ENDO                                 FACILITY:  [facility]   PHYSICIAN:  Delfin Edis, M.D. [facility]               DATE OF BIRTH:  12/29/1945   DATE OF PROCEDURE:  DATE OF DISCHARGE:  01/04/2003                                 OPERATIVE REPORT   INDICATIONS:  This 65 year old female has complained of clearing of the  throat, dysphagia, regurgitation. She has been treated with protein pump  inhibitors twice a day dose and has switched from Protonix to Nexium. Her  upper endoscopy on May 11, 2002, was normal. Her esophageal manometry  study showed normal study on January 04, 2003. She is undergoing 24-hour pH  probe to assess her while she is taking Protonix 40 mg p.o. b.i.d.   RESULTS:  A 24-hour intraesophageal pH probe was placed through nares into  the distal esophagus and rested at the depth of 37 cm from the nares. Total  24-hour composite score was 14.8 on the DeMeester scale, normal being less  than 22. In proximate channel, the patient had 19 episodes of reflux where  the pH was less than 4.  A total of 43 reflux episodes occurred in upright  position. The longest episode lasted 2 minutes. In the distal channel, the  upright time in reflux was 6.4% of the time.  __________ is normal.  Recumbent time was 0.71%. Total time in reflux was 4.2% which is normal. The  patient had one episode lasting over five minutes and it occurred. The  longest episode lasted 6.2 minutes.   IMPRESSION:  This is a normal intraesophageal pH probe in patient with  gastroesophageal reflux on Protonix 40 mg p.o. b.i.d. No evidence of excess  acid reflux.                                               Delfin Edis, M.D. [facility]    DB/MEDQ  D:  01/25/2003  T:  01/25/2003  Job:  [id number]   cc:   Juanda Bond. Harle Battiest, M.D.  [address]  [place]  [address]  Fax: [phone]

## 2010-12-07 NOTE — Assessment & Plan Note (Signed)
[name] [name]                                   ON-CALL NOTE   NAME:[name], [name]                        MRN:          [id number]  DATE:05/07/2006                            DOB:          1945-09-15    On-call phone message.  Date of call:  05/07/06.  Time of call:  7:18 p.m.  Caller is the patient's daughter.   Patient's daughter called this evening regarding possible allergic reaction.  Patient apparently underwent a CT scan today and has a known allergy to  contrast material.  I am told that she received premedication.  Currently,  the patient is at home complaining of facial swelling, fast heart rate, and  a raw feeling in the throat.  I advised them to proceed immediately to the  hospital for evaluation.  She agreed.            ______________________________  Docia Chuck. Geri Seminole., MD      JNP/MedQ  DD:  05/07/2006  DT:  05/09/2006  Job #:  [id number]   cc:   Lowella Bandy. Olevia Perches, MD

## 2010-12-07 NOTE — Procedures (Signed)
[facility]                            ABDOMINAL ULTRASOUND REPORT   NAME:[name] [name], [name] [name]                      MRN:          [id number]  DATE:04/03/2006                            DOB:          1946-02-15   ACCESSION #:  [id number]   INDICATION:  Abnormal liver function test.   READING PHYSICIAN:  Lowella Bandy. Olevia Perches, M.D.   PROCEDURE:  Multiplanar abdominal ultrasound imaging was performed in the  upright, supine, right and left lateral decubitus positions.   RESULTS:  Abdominal aorta 17 mm.  The inferior vena cava is patent.   The pancreas has a 14 x 17 x 13 mm complex cyst at the head of the pancreas  with a heterogeneous texture.  No definite mass seen.  The body of the  pancreas is normal.  The tail is suboptimally visualized.   Gallbladder is status post cholecystectomy.   The common bile duct measures 5.4 mm in maximal diameter without evidence of  intraluminal foci.   The liver reveals mild hepatomegaly; overall span about 16 cm.  Mild  increase in echodensity.   Kidneys - right with thinning center; otherwise normal.  Total length is 101  mm.  Left kidney with softening of the cortex; otherwise normal.   Spleen is normal in size, measuring 107 mm, upper limits of normal, without  parenchymal lesion.   IMPRESSION:  1. Pancreatic cyst, heterogeneous, no definite mass.  2. Status post remote cholecystectomy.  3. Mild increased liver echodensity with mild hepatomegaly.  4. Thinning of the renal cortices.                                   Lowella Bandy. Olevia Perches, MD   DMB/MedQ  DD:  04/03/2006 DT:  04/04/2006 Job #:  [id number]   cc:   Laurian Brim

## 2010-12-07 NOTE — Assessment & Plan Note (Signed)
[facility]                           GASTROENTEROLOGY OFFICE NOTE   NAME:[name] [name], [name] [name]                      MRN:          [id number]  DATE:04/02/2006                            DOB:          09-Sep-1945    [name] [name] is a very nice 65 year old white female who is here for  evaluation of abnormal liver function tests.  She has had several sets of  liver function tests by Dr. Hardin Negus; unfortunately, we have not received  any of them.  There apparently has been an improvement after she was taken  off Nexium 40 mg twice a day, but as of the last set of liver function  tests, they are not completely back to normal.  She has a positive family  history of liver disease in her father, who died of advanced liver disease,  which was non-alcohol-related.  She denies the use of alcohol.  She received  blood transfusions in the past while having a kidney operation many years  ago.  The patient is a diabetic.   MEDICATIONS:  1. Lexapro 10 mg daily.  2. Metoprolol 50 mg b.i.d.  3. Clorazepate 3.75 mg b.i.d.  4. Macrobid 100 mg daily.  5. Furosemide 20 mg daily.  6. Aspirin 81 mg p.o. daily.  7. Magnesium.  8. Albuterol.  9. Omega-3.  10.Prilosec after the discontinuation of the Nexium several months ago.  11.Probiotics.   Last colonoscopy was in 2001 because of family history of colon cancer in  direct relatives.  Her father had colon polyps.  History of severe  gastroesophageal reflux disease evaluated with 24-hour pH probe and  esophageal manometry in 2003, which were essentially negative while taking  Nexium.  We have a set of normal liver function tests on her from 1997.   PHYSICAL EXAM:  VITAL SIGNS:  Blood pressure 110/60, pulse 62.  Weight 213  pounds; the patient was mildly overweight.  HEENT:  Sclerae were anicteric.  SKIN:  No spider nevi.  LUNGS:  Clear to auscultation.  NECK:  Supple without adenopathy.  COR:  Normal S1 and S2.  ABDOMEN:  Soft and nontender with normoactive bowel sounds.  Post-  laparoscopic-cholecystectomy scars.  Liver edge was at costal margin.  There  was diffuse tenderness along the transverse colon, left colon and right  colon as well.  EXTREMITIES:  Trace edema.  There was no ascites.  No asterixis.   IMPRESSION:  A 65 year old white female with abnormal liver function tests;  we do not have them.  This could be related to some hereditary liver disease  such as hemochromatosis or alpha-1 antitrypsin deficiency or due to Wilson's  disease.  We also need to rule out a history of hepatitis, fatty liver due  to diabetes or some medication-related abnormality, especially Macrobid.   PLAN:  PT, PTT, repeat liver function tests, alpha-1 antitrypsin,  ceruloplasmin, ferritin, hepatitis serologies, platelet count, also  ultrasound of the liver and I have advised the patient to hold her Macrobid  because it can cause abnormal liver function tests.  Lowella Bandy. Olevia Perches, MD   DMB/MedQ  DD:  04/02/2006  DT:  04/03/2006  Job #:  [id number]   cc:   Laurian Brim

## 2010-12-07 NOTE — Assessment & Plan Note (Signed)
[facility]                             PULMONARY OFFICE NOTE   NAME:[name] [name], [name] [name]                      MRN:          [id number]  DATE:08/07/2006                            DOB:          05/08/1946    PRIMARY CARE PHYSICIAN:  Dr. Eliezer Lofts.   GASTROENTEROLOGY:  Dr. Delfin Edis.   PROBLEM:  1. Obstructive sleep apnea with hypersomnia.  2. Benign tremor.  3. Esophagus reflux.  4. Abnormal chest CT with density right lower lobe, for follow up.   HISTORY:  She is being followed by GI for abnormal liver functions. CT  scan October 25th had demonstrated a thickened lower lobe bronchus on  the right medially with small branching obstructed appearing bronchi  distally and follow-up was needed. I discussed this with her after I  reviewed the imaging. I felt the area would be difficult to reach except  by washings and that we would not see it on fluoroscopy if we attempted  biopsy in the area. She has changed primary physician's. She describes  feeling tired in the day and has to get up and walk around at around 3  p.m. She continues to wear her CPAP which is  7 CWP. She chooses not to try change in pressure at this time because  she feels the tiredness is due to recently diagnosed diabetes. She has  some morning cough, bringing up thick mucus, sometimes a little yellow,  never bloody. There has been no chest pain or fever. She coughs mostly  early in the mornings and sometimes immediately after a meal.   MEDICATIONS:  1. Lexapro 10 mg.  2. Metoprolol 50 mg b.i.d.  3. Furosemide 20 mg p.r.n.  4. Aspirin 81 mg daily.  5. Magnesium.  6. Albuterol Rescue inhaler used p.r.n.  7. Prilosec.  8. Probiotics.  9. Glipizide 10 mg.   ALLERGIES:  DRUG INTOLERANT OF ERYTHROMYCIN AND IV CONTRAST (She called  after CT scan in October when she was given contrast and at that time  described facial swelling, fast heart rate and a raw feeling in the   throat.   She requests handicapped parking because of her husband.   OBJECTIVE:  VITAL SIGNS: Weight stable at 213 pounds. Blood pressure  128/70, pulse regular at 53, room air saturation 96%.  GENERAL: She is overweight, alert and apparently comfortable. I find no  adenopathy or rash.  CHEST: Breath sounds are shallow with a few crackles across both bases.  No cough or wheeze, no dullness.  NECK: No neck vein distention or stridor.  HEART: Regular heart sounds without murmur. There is no peripheral edema  or cyanosis.   IMPRESSION:  1. Obstructive sleep apnea on CPAP at 7 CWP. Complaint of daytime      tiredness without change in sleep habits is nonspecific. She has no      information from her husband to suggest CPAP is inadequate and      wants to wait on pressure change. I suggested the possibility that      she could use 1/2 of a No-Doze tablet  p.r.n. if her heart rhythm      will tolerate this.  2. Right lower lobe abnormality on October CT, for follow-up.  Most      likely this is an area of bronchitis with mucus plugging.  3. Esophageal reflux with reflux precautions re-emphasized.   PLAN:  1. Follow-up CT scan of the chest is being scheduled for comparison      with the October 25th study to look again at the right lower lobe.      She will call for that report. We are scheduling follow-up in 6      months, earlier p.r.n.     Clinton D. Annamaria Boots, MD, [facility], [place]  Electronically Signed    CDY/MedQ  DD: 08/07/2006  DT: 08/07/2006  Job #: [id number]   cc:   Eliezer Lofts, MD  Lowella Bandy. Olevia Perches, MD

## 2010-12-07 NOTE — Assessment & Plan Note (Signed)
[facility]                         GASTROENTEROLOGY OFFICE NOTE   NAME:[name] [name], [name] [name]                      MRN:          [id number]  DATE:08/25/2006                            DOB:          Sep 24, 1945    [name] [name] is a 65 year old white female with several GI issues.  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 Ardis Hughs, biopsied and evaluated at [facility] by  Dr. Dossie Der.  She is on q.4 months CT scan of the pancreatic protocol  first year and every 6 months of second year, providing the lesion is  stable size-wise.  She also has gastroesophageal reflux disease for  which she in the past took Nexium, currently on Prilosec with only  partial relief of her symptoms that are forwarded by Dr. Keturah Barre.  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  Third GI problem has been  abnormal liver function tests.  Her father died on non-alcohol related  liver disease at age of 69 and her brother has chronic elevation of  liver function tests.  We have done a panel of liver tests in the last  several months and only one abnormality came back, ANA titer of 1 to 40.  She continues to have elevated transaminases, specifically ALT about 3x  normal with slight elevation of AST and normal alkaline phosphatase and  bilirubin.   MEDICATIONS:  1. Lexapro 10 mg daily.  2. Metoprolol 15 mg b.i.d.  3. Aspirin 81 mg daily.  4. Magnesium.  5. Prilosec 20 mg p.o. daily.  6. Probiotics.  7. Glipizide.  8. Nasacort.  9. B12, folate.   PHYSICAL EXAMINATION:  Blood pressure 120/62, pulse 68, weight 214  pounds.  She was alert and oriented, in no distress.  LUNGS:  Clear to auscultation.  HEART:  Normal S1, S2.  There were no spider nevi, no angiomas, no palmar erythema.  ABDOMEN:  Soft, nontender, with normal  percussion of  the liver.  Splenic  tip palpable.  Abdomen was normal.   IMPRESSION:  35. A  65 year old white female with stable cystic pancreatic neoplasm      followed by CT scans.  2. Abnormal transaminase elevation, rule out fatty liver, rule out      genetic liver disease not yet diagnosed.  3. Gastroesophageal reflux disease.   PLAN:  1. Schedule CT scan of the pancreas first week in March 2008.  2. CT scan of the chest and pancreas will be done in July 2008.  3. Switch from Prilosec to Nexium 40 mg p.o. b.i.d.  A 90-day supply      was given.  4. Up-to-date ANA, seroplasm, again ANA titer, IgG, IgM and also      fetoprotein levels.     Lowella Bandy. Olevia Perches, MD  Electronically Signed    DMB/MedQ  DD: 08/25/2006  DT: 08/25/2006  Job #: [id number]   cc:   Eliezer Lofts, MD

## 2010-12-07 NOTE — Op Note (Signed)
   NAME:  [name] [name], [name] [name]                         ACCOUNT NO.:  [id number]   MEDICAL RECORD NO.:  [id number]                   PATIENT TYPE:  AMB   LOCATION:  ENDO                                 FACILITY:  [facility]   PHYSICIAN:  Delfin Edis, M.D. [facility]               DATE OF BIRTH:  12-08-1945   DATE OF PROCEDURE:  01/04/2003  DATE OF DISCHARGE:                                 OPERATIVE REPORT   PROCEDURE:  Esophageal manometry.   ENDOSCOPIST:  Delfin Edis, M.D.   INDICATIONS:  This 65 year old female has complaints of dysphagia and  reflux.  She has been on high dose Proton pump inhibitors and is undergoing  manometry to determine her lower esophageal sphincter pressure and to rule  out primary motility disorder.   RESULTS:  Manometric catheter passed via nares to the stomach with ease.  The patient had frequent fast pulse and compliant of increasing nasal  congestion during the procedure.  A lower esophageal sphincter was located  at 45 cm from the incisors.  Total length of the LES was 4 cm.  The intra-  abdominal LES was 2.5 cm.  __________ measurements of LES was 13.  The lower  esophageal sphincter pressure was 18.8 mmHg; this is a normal LES.  Normal  range from 10-45 mmHg.  The residual pressure was 3.5 mm with a 73%  relaxation; this is a normal relaxation.   The peristaltic rates were measured in the proximal and distal esophagus.  Oral amplitude and duration of the contractures were normal.  The patient  out of 10 wet swallows, 9 of them were positive with normal amplitude and  duration.  There was one simultaneous contraction.  The patient's upper  esophageal sphincter was normal with complete relaxation of 98%.   IMPRESSION:  This is a normal esophageal manometry study with normal lower  esophageal sphincter relaxation and motility.                                               Delfin Edis, M.D. [facility]    DB/MEDQ  D:  01/23/2003  T:  01/24/2003  Job:  [id number]

## 2010-12-07 NOTE — Assessment & Plan Note (Signed)
[name] OFFICE NOTE   NAME:[name], [name] [name]                      MRN:          [id number]  DATE:06/24/2006                            DOB:          Sep 03, 1945    CHIEF COMPLAINT:  A 65 year old white female here to establish new  doctor.   HISTORY OF PRESENT ILLNESS:  [name] [name] is changing physicians and  currently has several medical concerns:   1. Elevated LFTs:  She states that for elevated LFTs she had been      following them for several months with her previous primary care      doctor. She was encouraged by her family to see a specialist      regarding this. She had an abdominal CT done that showed a slightly      abnormal liver, but additionally saw pancreatic lesion. This was      worked up further suggesting a 1.5 cm mucinous cyst on the head of      her pancreas. She has a specialist who she sees, pancreatic      oncologist Dr. Dossie Der as well as Dr. Ardis Hughs, a pancreatic      specialist in [place]. They are following the pancreatic cysts      with CTs every four months. In addition incidentally there was a      small thickening of the lung tissue on CT that was verified by a      chest CT. This is being followed by Dr. Annamaria Boots who was unable to      biopsy, but plans on following it with frequent CTs.  2. Diabetes poor control:  She states fasting blood sugars range      between 170 to 289. She rarely takes a post prandial blood sugar.      She states her diet is not at goal secondary to some of her GI      intolerance. She has also never seen a nutritionist. She is unsure      what her last hemoglobin A1c was, but feels like it was over three      months ago. She has never been on medicine for diabetes, but is      interested in doing so if it makes her feel better.  3. Memory concerns:  [name] [name] states that for about the past two      years she has had some problems with her short-term  memory. She      does not really notice it as much as her family tells her that she      seems to forget things. She feels that she does not have sluggish      thinking. She denies depression currently, but does continue to      take Lexapro 10 mg daily. In addition she has chronically been on      clorazepate 3.75 mg p.o. b.i.d. which is similar to Valium. She      states she did stop this abruptly in the past and did have      withdrawal.  She also comments that she has significant stress and      anxiety in her life. She denies suicidal or homicidal ideation.  4. Irritable bowel syndrome and gas:  She states that this has been a      problem for her off and on over the past few months. She does not      notice any particular food that causes more gas. She has started      Lactobacilli several months ago for her irritable bowel syndrome.      Her abdominal cramping improved significantly, but that is when she      noticed the beginning of gas.   REVIEW OF SYSTEMS:  Otherwise, negative.   PAST MEDICAL HISTORY:  1. Gastroesophageal reflux disease.  2. Diabetes.  3. Benign essential tremor.  4. Depression.  5. Diverticulosis.  6. Irritable bowel syndrome.  7. Allergic rhinitis.  8. Heart arrhythmia, possible SVT versus PVCs.  9. Urinary incontinence.  10.Hypercholesterolemia.   HOSPITALIZATIONS, SURGERIES, PROCEDURES:  1. October 2007:  CT of abdomen showing slightly abnormal liver and      pancreatic lesion.  2. October 2007:  Pancreatic CT showing cyst.  3. October 2007:  Chest CT showing small thickening of lung.  4. October 2007:  Endoscopy ultrasound-guided of pancreas, colon      showed mucinous cyst 1.5 cm on pancreas.  5. 2005:  Rotator cuff surgery.  6. 2002 colonoscopy showing diverticulitis.  7. 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  8. 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  9. 1982 hysterectomy, partial.  10.2000 cholecystectomy.   11.Mammogram negative in 2004.   FAMILY HISTORY:  Father with history of liver cirrhosis as well as some  alcoholism in her parents. Grandparents with arthritis. No family  history of heart attack before age 55. No clear cancer history.   SOCIAL HISTORY:  She still works full-time. She is married. She has  several children. She does not get regular exercise. She describes her  diet as poor given that she has to avoid a lot of things for her  irritable bowel syndrome, but does not stick to a diabetic diet. No  smoking, no alcohol, no tobacco use.   PHYSICAL EXAMINATION:  VITAL SIGNS:  Height 65 inches, weight 217 making  BMI 37, blood pressure 132/84, pulse 92, temperature 98.1.  GENERAL:  Obese-appearing female in no apparent distress.  HEENT:  PERRLA, extraocular muscles intact, oropharynx clear, tympanic  membranes clear, nares clear.  NECK:  No thyromegaly, no lymphadenopathy supraclavicular or cervical.  CARDIOVASCULAR:  Regular rate and rhythm. No murmurs, rubs or gallops.  Normal PMI, 2+ peripheral pulses. No peripheral edema.  PULMONARY:  Clear to auscultation bilaterally. No wheezes, rales or  rhonchi.  ABDOMEN:  Soft, nontender, normoactive bowel sounds. No  hepatosplenomegaly.  MUSCULOSKELETAL:  Strength 5/5 in upper extremities. Full range of  motion in shoulders and knees.  NEUROLOGICAL:  Alert and oriented x3. Cranial nerves II-XII are grossly  intact. Reflexes 2+ patellar.   ASSESSMENT/PLAN:  1. Pancreatic mucinous cyst:  Follow up with Dr. Ardis Hughs as scheduled.      Plan CTs q.4 months.  2. Lung mass:  Will obtain records from Dr. Annamaria Boots. She will follow up      with him, and he will perform CT followup. The patient did express      a concern today for a possible need for PET scan. We discussed      this, and if her  CT scan seems stable we will hold off. If there is      any change in her CT scans, I will speak with both her pancreatic     and lung doctor to discuss  whether PET scan for other areas of      abnormality is indicated.  3. Diabetes poor control:  Will obtain a hemoglobin A1c, microalbumin,      basic metabolic panel as well as cholesterol screen. She very      likely will need diabetic medication as suggested by her fasting      blood sugars. I did review the guidelines for diabetes care today.      She was recommended to have an eye exam yearly. She mentions that      she has occasional burning in her feet that may be early peripheral      neuropathy. At her next visit we will check a monofilament test.      She was given information about low carb diet and exercise and      weight loss.  4. Short term memory concerns:  She may be having some side effects      from being on clorazepate chronically. I will instruct her to      gradually wean this off a half a tablet per week over the next few      months. If her symptoms continue of problems with memory, we can      look into it further.  5. Depression, stable:  She will currently stay on Lexapro at the      current dose.  6. Gas and irritable bowel syndrome:  The gas which is bothering her      most at this point may be secondary to the Lactobacillus. I      instructed her to maybe try to change to yogurt with active      Lactobacilli as opposed to the tablet. She may also try Beano. We      can follow up with this at the next visit.  7. Prevention:  She is due for a mammogram which was ordered today.      She is up-to-date with colon cancer screening. She was given a      tetanus and a flu vaccine today. Recommended 81 mg of aspirin      daily.     Eliezer Lofts, MD  Electronically Signed    AB/MedQ  DD: 06/24/2006  DT: 06/25/2006  Job #: [id number]

## 2010-12-07 NOTE — Assessment & Plan Note (Signed)
[facility]                         GASTROENTEROLOGY OFFICE NOTE   NAME:Merkley, HARUNA ROHLFS                      MRN:          [id number]  DATE:10/03/2006                            DOB:          1945-11-27    PROFESSOR OF SURGERY:  Ms. Mcclaskey is a 65 year old white female with a  history of cystic pancreatic neoplasm followed by CT scan, next one in  July 2008. Evaluated recently by Dr. Dossie Der at [facility].  She has a history  of gastroesophageal reflux disease, and most recently being evaluated  for elevated liver function test.  Her liver function abnormalities date  back several years ago.  The ultrasound confirmed presence of fatty  liver, but her ANA titer which used to be 1:40 is now 1:160.  All of the  other liver function tests, as well as markers for liver disease have  been negative, including Hepatitis serologies.  Because of the  persistent liver function abnormalities, she is here today to discuss  liver biopsy.  On ultrasound, she had about the same or slightly larger  lesion in the pancreatic cyst, compared to October 2007 study.  She also  has a stable density in the right middle lung base.   Today, I have discussed liver biopsy with the patient.  We are going to  move ahead with ultrasound-guided liver biopsy.  Her coagulation studies  have been normal.  The purpose of the liver biopsy is to rule out  autoimmune liver disease.  If patient is shown to have autoimmune liver  disease, she would be a candidate for a immunomodulator therapy,  specifically Imuran.     Lowella Bandy. Olevia Perches, MD  Electronically Signed    DMB/MedQ  DD: 10/03/2006  DT: 10/04/2006  Job #: [id number]   cc:   Eliezer Lofts, MD

## 2010-12-07 NOTE — Assessment & Plan Note (Signed)
[facility]                         GASTROENTEROLOGY OFFICE NOTE   NAME:Angelos, WILLEAN SCHURMAN                      MRN:          [id number]  DATE:11/20/2006                            DOB:          03-24-1946    Ms. Motter is a 65 year old white female whom we have been recently  evaluating for pancreatic cystic neoplasm.  She has been seen and  evaluated by Dr. Dossie Der at [facility].  We have also most recently evaluated  her for abnormal liver function tests and found that she has a positive  ANA title 1:160, and mild elevation on transaminases.  Further liver  biopsy confirmed presence of chronic active hepatitis without  significant fibrosis, but with activity in the periportal areas.  Her  viral studies were negative.  We have started on Imuran 100 mg a day but  she was on it only for 2 weeks when she developed what she thinks was a  viral gastroenteritis and became intolerant to Imuran in the process  while taking Flagyl and Cipro.  It is not entirely clear whether the  nausea, vomiting, and diarrhea was purely viral, or whether the Imuran  contributed to it.  After discussing it with the patient we discontinued  the Imuran and she has been off it now for a couple of weeks.  The  nausea and vomiting has slowly settled down.  She still does not feel  back to normal.   MEDICATIONS:  1. Metoprolol 50 mg p.o. b.i.d.  2. Aspirin 81 mg p.o. daily.  3. Magnesium.  4. Albuterol p.r.n.  5. Nexium 40 mg p.o. b.i.d.  6. Probiotics.  7. Glipizide 5 mg p.o. b.i.d.  8. Nasacort.  9. B-12 p.o. p.r.n.  10.Folate.  11.Multiple vitamins.  12.Lasix 20 mg daily.   PHYSICAL EXAMINATION:  Blood pressure 112/68, pulse 60, weight 217  pounds.  She was alert, oriented, and in no distress.  LUNGS:  Clear to auscultation.  COR:  With normal S1, normal S2.  ABDOMEN:  Soft, with mild tenderness in left lower quadrant overlying  the sigmoid colon.  Liver edge was at costal  margin and there was no  tenderness in right upper quadrant, right lower quadrant was also  unremarkable.  Bowel sounds were normal.  RECTAL:  Exam not done today.  EXTREMITIES:  No edema.   Liver function tests a day before yesterday showed AST of 42 and ALT of  43 which is about the same as it has been all along with normal  bilirubin of 0.8, and albumin of 3.4.   Most recent CT scan of the abdomen and chest showed again nodule in the  right  middle lobe of the lung which has been followed and needs a  repeat CT scan in July of this year.  It is in the right medial lung  base, it is spiculated and the radiology suggested followup.  She also  needs another CT scan of the pancreatic cyst as recommended by Dr.  Dossie Der, this will be also  done in July.   I have discussed treatment of chronic active hepatitis with  the patient,  I am concerned that she may not be tolerating her Imuran and that she  may need an alternative medication for  a  longterm immunosuppression.  I also feel that her disease is rather mild and that there may be a  question of whether she ought to be treated at all since there is no  fibrosis on the liver biopsy.  I have suggested to her that we seek  another opinion from Dr. Lincoln Brigham at [facility].  Let him review the  liver biopsies and decide whether she ought to be continued on Imuran.   PLAN:  1. Recall CT scan of the right lung and of the abdomen of the cystic      neoplasm in July 2008.  Patient will receive a recall letter.  2. Levsin sublingually 0.125 mg p.r.n. crampy abdominal pain.  3. Refill for Nexium 40 mg p.o. b.i.d.  4. Wait with restarting the Imuran.  We will rechallenge her when her      gastroenteritis settles down in about 2 weeks.  She will start on      the 50 mg a day for 2 weeks and if tolerated she will increase the      dose to the 100 mg a day.  5. A referral to [facility] to Dr. Tamala Julian.  6. The patient is due for colonoscopy.  Last exam  2001, she has a      positive history of colon cancer in her father.     Lowella Bandy. Olevia Perches, MD  Electronically Signed    DMB/MedQ  DD: 11/20/2006  DT: 11/20/2006  Job #: [id number]   cc:   Lincoln Brigham, MD

## 2010-12-07 NOTE — Assessment & Plan Note (Signed)
[facility]                               PULMONARY OFFICE NOTE   NAME:[name], [name] WEDEL                      MRN:          [id number]  DATE:03/25/2006                            DOB:          [birth year]    PROBLEM LIST:  1. Obstructive sleep apnea with hypersomnia.  2. Benign tremor.  3. Esophageal reflux.   GASTROENTEROLOGIST:  [name] [name]. [name] Perches, MD   HISTORY:  She continues CPAP at 7 CWP through [facility].  If she  wears it all night, she feels better.  Sometimes she pulls it off not quite  awake around 4:00 a.m., and then she will wake feeling much worse, almost  panicked an hour or two later.  She has noticed more reflux and indigestions  since she was taken off of Prilosec and she is pending followup with Dr.  Olevia [name].   MEDICATIONS:  Nasacort, metoprolol, multivitamins, calcium, Prilosec  (discontinued), furosemide, CPAP 7 CWP, nitroglycerine spray, albuterol  inhaler, Astelin nasal spray.   ALLERGIES:  DRUG INTOLERANT TO CODEINE, IVP DYE AND MORPHINE.   OBJECTIVE:  VITAL SIGNS:  Weight 213 pounds.  BP 120/68.  Pulse regular at  58.  Room air saturation 94%.  HEENT:  Nasal mucosa looks normal and there are no pressure marks around her  face from the mask.  No neck vein distention.  Pharynx is clear.  LUNGS:  Breathing is unlabored.  CARDIAC:  Pulse regular.   IMPRESSION:  She has managed to lose a few pounds, which is in the right  direction, but not enough to make a real difference yet.  She recognizes  that she feels better when she keeps CPAP on and does not identify specific  discomfort, so I think eventually it will get to be a comfortable routine  for her.   PLAN:  CPAP comfort measures and compliance encouragement were provided.  We  discussed management of dry nose during the winter.  She will follow up with  Dr. Olevia [name] for help for her GERD symptoms and schedule return in six months  or earlier p.r.n.[name] Fuller D. Annamaria Boots, MD, FCCP, FACP   CDY/MedQ  DD:  03/25/2006  DT:  03/26/2006  Job #:  [id number]   cc:   [name] [name]

## 2010-12-10 ENCOUNTER — Ambulatory Visit: Payer: Self-pay | Admitting: Family Medicine

## 2010-12-10 DIAGNOSIS — Z0289 Encounter for other administrative examinations: Secondary | ICD-10-CM

## 2010-12-20 ENCOUNTER — Other Ambulatory Visit: Payer: Self-pay | Admitting: Family Medicine

## 2010-12-21 ENCOUNTER — Other Ambulatory Visit: Payer: Self-pay | Admitting: Family Medicine

## 2010-12-28 ENCOUNTER — Telehealth: Payer: Self-pay | Admitting: *Deleted

## 2010-12-28 ENCOUNTER — Ambulatory Visit (INDEPENDENT_AMBULATORY_CARE_PROVIDER_SITE_OTHER): Payer: 59 | Admitting: Family Medicine

## 2010-12-28 DIAGNOSIS — R300 Dysuria: Secondary | ICD-10-CM | POA: Insufficient documentation

## 2010-12-28 LAB — POCT URINALYSIS DIPSTICK
Leukocytes, UA: NEGATIVE
Nitrite, UA: NEGATIVE
Protein, UA: NEGATIVE
Urobilinogen, UA: NEGATIVE

## 2010-12-28 NOTE — Progress Notes (Signed)
Patient has glucose in her urine, she stated that her diabetes is not under good control right now.  She schedule an appt to see Dr. Diona Browner in a week.  Uses [facility].  Phone number for patient is [phone].

## 2010-12-28 NOTE — Telephone Encounter (Signed)
Pt is complaining of UTI symptoms.  Started this morning with burning and pressure.  No fever, no abd pain.  Does have lower back pain on right side, but always has that.  She is asking if she can come in and leave urine sample, since there are no appts available.  Per Dr Diona Browner, advised pt she will be able to do that this time only, will need need office visit in the future.  Pt to come in this afternoon.

## 2010-12-28 NOTE — Assessment & Plan Note (Signed)
Blood in urine, no clear sign of infection such as nitrate or LE.  We will send for culture to verify no infection.  Pt notified by Nira Conn on phone.

## 2010-12-31 LAB — URINE CULTURE

## 2011-01-04 ENCOUNTER — Encounter: Payer: Self-pay | Admitting: Family Medicine

## 2011-01-04 ENCOUNTER — Ambulatory Visit (INDEPENDENT_AMBULATORY_CARE_PROVIDER_SITE_OTHER): Payer: 59 | Admitting: Family Medicine

## 2011-01-04 DIAGNOSIS — R911 Solitary pulmonary nodule: Secondary | ICD-10-CM

## 2011-01-04 DIAGNOSIS — R300 Dysuria: Secondary | ICD-10-CM

## 2011-01-04 DIAGNOSIS — J984 Other disorders of lung: Secondary | ICD-10-CM

## 2011-01-04 DIAGNOSIS — K5730 Diverticulosis of large intestine without perforation or abscess without bleeding: Secondary | ICD-10-CM

## 2011-01-04 DIAGNOSIS — R9389 Abnormal findings on diagnostic imaging of other specified body structures: Secondary | ICD-10-CM

## 2011-01-04 DIAGNOSIS — E119 Type 2 diabetes mellitus without complications: Secondary | ICD-10-CM

## 2011-01-04 DIAGNOSIS — E7800 Pure hypercholesterolemia, unspecified: Secondary | ICD-10-CM

## 2011-01-04 NOTE — Assessment & Plan Note (Signed)
No sig of infection on recent culture. Likely multifactorial including Dm poor control, urianry irritants, and mild urethriits from new soap.  Make changes as recommended. If not improving recheck UA ( small blood in last one).

## 2011-01-04 NOTE — Assessment & Plan Note (Signed)
Improved control from last check, but not at goal <100. She will be more aggressive with lifestyle changes.

## 2011-01-04 NOTE — Assessment & Plan Note (Signed)
New change on CT scan in 08/2010.Marland Kitchen Needs follow up CT chest as stated previously, reviewed in past with Dr. Annamaria Boots, Fatima Sanger.

## 2011-01-04 NOTE — Progress Notes (Signed)
  Subjective:    Patient ID: [name] [name], female    DOB: [birth year], 65 y.o.   MRN: [id number]  HPI  Missed 10/2010 DM appt. LAst A1C then, improved from 07/2010 but not at goal Lab Results  Component Value Date   HGBA1C 9.9* 11/14/2010  Diabetes:  Using medications without difficulties: Lantus  68 Units BID NOW! Hypoglycemic episodes:None Hyperglycemic episodes:Yes Feet problems: Blood Sugars averaging: fasting this AM 385 , occ checking in AM...eating white bread recently, has been snacking a lot at night. Poor diet.  Lung nodule... Last CT scan in 07/2010.Marland Kitchen Recommended re eval in 3-6 months. Pancreas lesion   Has been having  1 week of urinary burning, pressure, frequency.  In past Dr. Terance Hart has seen her for chronic recurrent UTIs. HAs been eating some tomato, lemon citris. HAs changed to Zest soap.  U culture from earlier in week negative.   Review of Systems     Objective:   Physical Exam        Assessment & Plan:

## 2011-01-04 NOTE — Patient Instructions (Addendum)
Decrease oragnge, lemon Crystal light, tomato,caffeine and other bladder irritants. Also  change back to gentle soap.  Call if symptoms not imrpoving in next 1-2 week. Titrate lantus up 2 Units every other day until sugar is at goal fasting less than 120 or until insulin dose above 150.  Check daily morning blood sugars and 2 hours after meals. Record and bring in measurements.  Get back on track with health eating and regular exercise. Lose weight !!!! Stop to speak with Rosaria Ferries on your way out.

## 2011-01-04 NOTE — Progress Notes (Signed)
  Subjective:    Patient ID: [name] [name], female    DOB: 1945/12/06, 65 y.o.   MRN: [id number]  HPI    Review of Systems    See HPI for ROS Objective:   Physical Exam  Constitutional: Vital signs are normal. She appears well-developed and well-nourished. She is cooperative.  Non-toxic appearance. She does not appear ill. No distress.       Morbidly obese  HENT:  Head: Normocephalic.  Right Ear: Hearing, tympanic membrane, external ear and ear canal normal. Tympanic membrane is not erythematous, not retracted and not bulging.  Left Ear: Hearing, tympanic membrane, external ear and ear canal normal. Tympanic membrane is not erythematous, not retracted and not bulging.  Nose: No mucosal edema or rhinorrhea. Right sinus exhibits no maxillary sinus tenderness and no frontal sinus tenderness. Left sinus exhibits no maxillary sinus tenderness and no frontal sinus tenderness.  Mouth/Throat: Uvula is midline, oropharynx is clear and moist and mucous membranes are normal.  Eyes: Conjunctivae, EOM and lids are normal. Pupils are equal, round, and reactive to light. No foreign bodies found.  Neck: Trachea normal and normal range of motion. Neck supple. Carotid bruit is not present. No mass and no thyromegaly present.  Cardiovascular: Normal rate, regular rhythm, S1 normal, S2 normal, normal heart sounds, intact distal pulses and normal pulses.  Exam reveals no gallop and no friction rub.   No murmur heard. Pulmonary/Chest: Effort normal and breath sounds normal. Not tachypneic. No respiratory distress. She has no decreased breath sounds. She has no wheezes. She has no rhonchi. She has no rales.  Abdominal: Soft. Normal appearance and bowel sounds are normal. There is no tenderness.  Neurological: She is alert.  Skin: Skin is warm, dry and intact. No rash noted.  Psychiatric: Her speech is normal and behavior is normal. Judgment and thought content normal. Her mood appears not anxious. Cognition and  memory are normal. She does not exhibit a depressed mood.          Assessment & Plan:

## 2011-01-04 NOTE — Assessment & Plan Note (Signed)
Poor control but improved from last check. She is very noncompliant with lifestyle changes. She states she will work on this aggressively.  Instructed to follow blood sugars daily, record and bring to next appt. Increase insulin up by titrating. She refuses adding back metformin given past SE, Byetta was not helpful in past and she does have history of pancreatic cyst.

## 2011-01-14 ENCOUNTER — Ambulatory Visit (INDEPENDENT_AMBULATORY_CARE_PROVIDER_SITE_OTHER)
Admission: RE | Admit: 2011-01-14 | Discharge: 2011-01-14 | Disposition: A | Discharge status: Home / Self Care | Payer: 59 | Source: Ambulatory Visit | Attending: Family Medicine | Admitting: Family Medicine

## 2011-01-14 ENCOUNTER — Ambulatory Visit: Payer: 59 | Admitting: Family Medicine

## 2011-01-14 DIAGNOSIS — R911 Solitary pulmonary nodule: Secondary | ICD-10-CM

## 2011-01-14 DIAGNOSIS — R9389 Abnormal findings on diagnostic imaging of other specified body structures: Secondary | ICD-10-CM

## 2011-01-14 DIAGNOSIS — J984 Other disorders of lung: Secondary | ICD-10-CM

## 2011-03-04 ENCOUNTER — Other Ambulatory Visit (INDEPENDENT_AMBULATORY_CARE_PROVIDER_SITE_OTHER): Payer: Medicare Other

## 2011-03-04 DIAGNOSIS — E119 Type 2 diabetes mellitus without complications: Secondary | ICD-10-CM

## 2011-03-07 ENCOUNTER — Encounter: Payer: Self-pay | Admitting: Family Medicine

## 2011-03-07 ENCOUNTER — Ambulatory Visit (INDEPENDENT_AMBULATORY_CARE_PROVIDER_SITE_OTHER): Payer: Medicare Other | Admitting: Family Medicine

## 2011-03-07 VITALS — BP 110/70 | HR 62 | Temp 98.7°F | Ht 65.0 in | Wt 223.8 lb

## 2011-03-07 DIAGNOSIS — R350 Frequency of micturition: Secondary | ICD-10-CM

## 2011-03-07 DIAGNOSIS — M25551 Pain in right hip: Secondary | ICD-10-CM | POA: Insufficient documentation

## 2011-03-07 DIAGNOSIS — M25559 Pain in unspecified hip: Secondary | ICD-10-CM

## 2011-03-07 DIAGNOSIS — E119 Type 2 diabetes mellitus without complications: Secondary | ICD-10-CM

## 2011-03-07 DIAGNOSIS — M545 Low back pain, unspecified: Secondary | ICD-10-CM

## 2011-03-07 DIAGNOSIS — J984 Other disorders of lung: Secondary | ICD-10-CM

## 2011-03-07 LAB — POCT URINALYSIS DIPSTICK
Bilirubin, UA: NEGATIVE
Blood, UA: NEGATIVE
Ketones, UA: NEGATIVE
pH, UA: 5

## 2011-03-07 MED ORDER — INSULIN GLARGINE 100 UNIT/ML ~~LOC~~ SOLN: 80.0000 [IU] | Freq: Two times a day (BID) | SUBCUTANEOUS | Status: DC

## 2011-03-07 MED ORDER — METFORMIN HCL ER 500 MG PO TB24: 500.0000 mg | ORAL_TABLET | Freq: Every day | ORAL | Status: DC

## 2011-03-07 MED ORDER — MELOXICAM 15 MG PO TABS: 15.0000 mg | ORAL_TABLET | Freq: Every day | ORAL | Status: DC

## 2011-03-07 NOTE — Assessment & Plan Note (Signed)
Most recent lung CT shows stable scarring and mucoid impaction since 2007 in [place]... Will forward to Pulmonary for further recommendations, but likely no further yearly eval needed.

## 2011-03-07 NOTE — Assessment & Plan Note (Signed)
Most consistent with muscle spasm in lumbar spine.. Not clear radicular symptoms.. Sometime pain in both legs, but not clearly consistent with spinal stenosis.  Will get X-ray from chiropractor. Start meloxicam daily.  Start home PT.  Virgel Manifold if not imrpoving in 2 weeks.

## 2011-03-07 NOTE — Assessment & Plan Note (Signed)
UA today showed:no sign of infection, but a lot of sugar. Pt told symptoms should improve if sugar decreased!!!!! Hopefully this will help with her compliance.

## 2011-03-07 NOTE — Assessment & Plan Note (Signed)
Very tender over trochanteric bursa.  Start with ice and PT at home. Start NSAID: meloxicam. Call if not imrpoving in 2 weeks.

## 2011-03-07 NOTE — Patient Instructions (Addendum)
Call in 2 weeks with blood sugar measurements fasting and 2 hours after meals. Start metformin low dose. Continue Lantus daily.  Start low back exercises and stretching for right hip. Start meloxicam for pain. We will get results of X-ray from chiropractor. If not improving in 2 weeks.. Call for PT referral or ortho eval for possible steroid injection in trochanteric bursa.
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
## 2011-03-07 NOTE — Assessment & Plan Note (Addendum)
Poor DM control.  She is noncompliant with medication.. "just forget" as well as with diet and exercise. It is hard to tell if fast acting insulin would be a good idea given I don't have any post prandial measurements. She is willing to start back metfromin low dose which will hopefully help as an insulin sensitizer. She will work on getting back on track with lifestyle changes. She will call with fasting and post prandial CBGs in 2 weeks.

## 2011-03-15 ENCOUNTER — Other Ambulatory Visit: Payer: Self-pay | Admitting: Family Medicine

## 2011-03-15 NOTE — Telephone Encounter (Signed)
No further refills needed. Will check vit D next lab draw to assess whether back in range.

## 2011-04-09 ENCOUNTER — Other Ambulatory Visit: Payer: Self-pay | Admitting: Family Medicine

## 2011-05-30 ENCOUNTER — Other Ambulatory Visit: Payer: Self-pay | Admitting: Family Medicine

## 2011-07-25 DIAGNOSIS — H40019 Open angle with borderline findings, low risk, unspecified eye: Secondary | ICD-10-CM | POA: Diagnosis not present

## 2011-07-25 DIAGNOSIS — H43399 Other vitreous opacities, unspecified eye: Secondary | ICD-10-CM | POA: Diagnosis not present

## 2011-07-25 DIAGNOSIS — H43819 Vitreous degeneration, unspecified eye: Secondary | ICD-10-CM | POA: Diagnosis not present

## 2011-07-25 LAB — HM DIABETES EYE EXAM: HM Diabetic Eye Exam: NORMAL

## 2011-07-27 ENCOUNTER — Other Ambulatory Visit: Payer: Self-pay | Admitting: Family Medicine

## 2011-08-27 ENCOUNTER — Other Ambulatory Visit: Payer: Self-pay | Admitting: Family Medicine

## 2011-08-30 ENCOUNTER — Encounter: Payer: Self-pay | Admitting: Family Medicine

## 2011-09-25 ENCOUNTER — Other Ambulatory Visit: Payer: Self-pay | Admitting: Family Medicine

## 2011-09-25 DIAGNOSIS — Z1231 Encounter for screening mammogram for malignant neoplasm of breast: Secondary | ICD-10-CM

## 2011-10-02 ENCOUNTER — Encounter: Payer: Self-pay | Admitting: Internal Medicine

## 2011-10-15 ENCOUNTER — Telehealth: Payer: Self-pay

## 2011-10-15 NOTE — Telephone Encounter (Signed)
Pt said on and off for 1 month heart rate goes from 36-88. Pt said she takes Metoprolol 50 mg one tab twice a day. Starting yesterday pt has had heaviness in chest with heart rate 56-60 and SOB. Pt also very tired feeling and soreness in rt lung or rt shoulder. Pt sweats periodically but pt said she is diabetic and that is not unusual. No chest pain. Dr Deborra Medina said pt needs to go to ER for evaluation. Pt said she did not want to go to ER. I explained the importance of getting her symptoms evaluated right away and pt said she would decide if she would go to ER or not.

## 2011-10-15 NOTE — Telephone Encounter (Signed)
Patients number has been disconnected that is in chart and what call a nurse got

## 2011-10-15 NOTE — Telephone Encounter (Signed)
Heather, please call to check on pt.

## 2011-10-21 ENCOUNTER — Ambulatory Visit
Admission: RE | Admit: 2011-10-21 | Discharge: 2011-10-21 | Disposition: A | Discharge status: Home / Self Care | Payer: Medicare Other | Source: Ambulatory Visit | Attending: Family Medicine | Admitting: Family Medicine

## 2011-10-21 DIAGNOSIS — Z1231 Encounter for screening mammogram for malignant neoplasm of breast: Secondary | ICD-10-CM

## 2011-11-18 ENCOUNTER — Encounter: Payer: Self-pay | Admitting: Family Medicine

## 2011-11-18 ENCOUNTER — Ambulatory Visit (INDEPENDENT_AMBULATORY_CARE_PROVIDER_SITE_OTHER): Payer: Medicare Other | Admitting: Family Medicine

## 2011-11-18 VITALS — BP 120/60 | HR 77 | Temp 98.3°F | Ht 65.5 in | Wt 225.4 lb

## 2011-11-18 DIAGNOSIS — M76899 Other specified enthesopathies of unspecified lower limb, excluding foot: Secondary | ICD-10-CM

## 2011-11-18 DIAGNOSIS — R000 Tachycardia, unspecified: Secondary | ICD-10-CM

## 2011-11-18 DIAGNOSIS — M7710 Lateral epicondylitis, unspecified elbow: Secondary | ICD-10-CM

## 2011-11-18 DIAGNOSIS — M7700 Medial epicondylitis, unspecified elbow: Secondary | ICD-10-CM

## 2011-11-18 DIAGNOSIS — R002 Palpitations: Secondary | ICD-10-CM | POA: Diagnosis not present

## 2011-11-18 DIAGNOSIS — M7060 Trochanteric bursitis, unspecified hip: Secondary | ICD-10-CM

## 2011-11-18 DIAGNOSIS — R079 Chest pain, unspecified: Secondary | ICD-10-CM

## 2011-11-18 DIAGNOSIS — M549 Dorsalgia, unspecified: Secondary | ICD-10-CM

## 2011-11-18 MED ORDER — TRAMADOL HCL 50 MG PO TABS: 50.0000 mg | ORAL_TABLET | Freq: Four times a day (QID) | ORAL | Status: AC | PRN

## 2011-11-18 MED ORDER — CYCLOBENZAPRINE HCL 10 MG PO TABS: 10.0000 mg | ORAL_TABLET | Freq: Three times a day (TID) | ORAL | Status: AC | PRN

## 2011-11-18 NOTE — Progress Notes (Signed)
Patient Name: [name] [name] Date of Birth: 27-Aug-1945 Medical Record Number: [id number] Gender: female Date of Encounter: 11/18/2011  History of Present Illness:  [name] [name] is a 66 y.o. very pleasant female patient who presents with the following:  "Back and heart" problems  Was taking some meloxicam. Lateral numbness - multiple musculoskeletal complaints.  H/o SVT: Takes some metoprolol, and when taking mobic, felt like she was having some palpitations, low heart beat, and symptomatic bradycardia, down in the 30's at the time and having palpitations. Felt some heaviness in her chest. Went away. Will go away for a couple of days, then it will come back. Has been happening more often lately. While in [place] and she had pushed herself the day before.  When sat down, had some pain in her head. Some Discomfort in the temple.  Makes her "breathless" and no energy when she is having the sensation that her heart is beating in an irregular pattern. This will last for minutes at a time.  Originally saw [facility], 6-7 years ago. She has not had any cardiology visit in some time.  Right now, entire back, mostly across the mid back. And some  Medial and lateral arms aare hurting and some pain in the muscle.   She is having pain from her thoracic  Spine area through her lumbar region as well as significant RIGHT greater than LEFT trochanteric bursitis, and is having some significant medial and lateral forearm pain in both sides.   Patient Active Problem List  Diagnoses  . TUBULOVILLOUS ADENOMA, COLON  . DIABETES MELLITUS, TYPE II  . HYPERCHOLESTEROLEMIA  . OBESITY  . TREMOR, ESSENTIAL  . POLYNEUROPATHY IN DIABETES  . TINNITUS, CHRONIC, BILATERAL  . SUPRAVENTRICULAR TACHYCARDIA  . ALLERGIC RHINITIS  . LUNG NODULE  . GERD  . DIVERTICULOSIS, COLON  . IBS  . DISORDER, LIVER NEC  . CYST AND PSEUDOCYST OF PANCREAS  . SLEEP APNEA  . URINARY INCONTINENCE  . UNSPECIFIED VITAMIN D  DEFICIENCY  . Urinary frequency  . Low back pain  . Right hip pain   Past Medical History  Diagnosis Date  . Acute cystitis   . Acute pharyngitis   . Other diseases of lung, not elsewhere classified   . Cyst and pseudocyst of pancreas   . Polyneuropathy in diabetes   . Pain in joint, lower leg   . Type II or unspecified type diabetes mellitus with neurological manifestations, not stated as uncontrolled   . Edema   . Dysuria   . Unspecified disorder of liver   . Periapical abscess without sinus   . Depressive disorder, not elsewhere classified   . Benign neoplasm of colon   . Unspecified sleep apnea   . Obesity, unspecified   . Pure hypercholesterolemia   . Unspecified urinary incontinence   . Other specified cardiac dysrhythmias   . Irritable bowel syndrome   . Diverticulosis of colon (without mention of hemorrhage)   . Dysthymic disorder   . Essential and other specified forms of tremor   . Esophageal reflux   . Allergic rhinitis, cause unspecified   . Type II or unspecified type diabetes mellitus without mention of complication, not stated as uncontrolled   . Abnormal abdominal CT scan 04/2006    slight abn liver  . Abnormal CT scan, pancreas or bile duct 04/2006    cyst  . Abnormal chest CT 10/07    unable to bx   Past Surgical History  Procedure Date  .  Rotator cuff repair 2005  . Kidney stone surgery 1980's    removal  . Partial hysterectomy 1990's  . 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 Cholecystectomy    History  Substance Use Topics  . Smoking status: Never Smoker   . Smokeless tobacco: Not on file  . Alcohol Use: Not on file   Family History  Problem Relation Age of Onset  . Cirrhosis Father   . Diabetes      2 aunts  . Clotting disorder Mother   . Cancer      grandmother  . Allergies      "whole family"   Allergies  Allergen Reactions  . Epinephrine Hcl     REACTION: Had tremors when it was given.  . Erythromycin Ethylsuccinate      REACTION: abd spasms  . Iohexol      Code: HIVES, Desc: pt needs premedicated-- hives on prev contrast study per md office     Medication list has been reviewed and updated.  Review of Systems: As above No acute chest pain right now. No palpitations right now. As above. No fevers, chills, or sweats. No bowel or bladder incontinence. No anesthesia.  Physical Examination: Filed Vitals:   11/18/11 1225  BP: 120/60  Pulse: 77  Temp: 98.3 F (36.8 C)  TempSrc: Oral  Height: 5' 5.5" (1.664 m)  Weight: 225 lb 6.4 oz (102.241 kg)  SpO2: 97%    Body mass index is 36.94 kg/(m^2).   GEN: WDWN, NAD, Non-toxic, A & O x 3 HEENT: Atraumatic, Normocephalic. Neck supple. No masses, No LAD. Ears and Nose: No external deformity. CV: RRR, No M/G/R. No JVD. No thrill. No extra heart sounds. PULM: CTA B, no wheezes, crackles, rhonchi. No retractions. No resp. distress. No accessory muscle use. EXTR: No c/c/e NEURO Normal gait.  PSYCH: Normally interactive. Conversant. Not depressed or anxious appearing.  Calm demeanor.    Elbow: b Ecchymosis or edema: neg ROM: full flexion, extension, pronation, supination Shoulder ROM: Full Flexion: 5/5, painful Extension: 5/5, PAINFUL Supination: 5/5, PAINFUL Pronation: 5/5, painful Wrist ext: 5/5, painful Wrist flexion: 5/5 No gross bony abnormality Varus and Valgus stress: stable ECRB tenderness: YES, TTP Medial epicondyle: TTP Lateral epicondyle, resisted wrist extension from wrist full pronation and flexion: PAINFUL grip: 5/5  sensation intact Tinel's, Elbow: negative   Lumbar spine is tender to palpation laterally in the adjacent musculature throughout much of the thoracic spine and down through to around L5-S1. Minimal tenderness in the sciatic notches. Trochanteric bursa is quite tender to palpation, more on the RIGHT. Negative straight leg raise. Sensation is intact in the lower extremities.  Assessment and Plan:  1. Palpitations   Ambulatory referral to Cardiology  2. Chest pain  EKG 12-Lead, Ambulatory referral to Cardiology  3. Tachycardia  Ambulatory referral to Cardiology  4. Lateral epicondylitis    5. Medial epicondylitis    6. Trochanteric bursitis    7. Back pain     Explained to the patient that her symptomatic palpitations and intermittent bradycardia and tachycardia is more important than her diffuse musculoskeletal complaints. At the very least, the patient needs to have a Holter monitor, and this does not seem to have a history consistent with SVT, and other more worrisome arrhythmias need to be excluded.  Globally, from a bone and joint standpoint, I explained to the patient that she is significantly out of shape, and she is having some chronic tendinopathy in multiple sites, as well  as some bursitis and back pain. She really needs to get in better shape and stronger throughout her body. Once her cardiac symptoms are sorted out, we can treat it readdress this. For now, given her some tramadol and some Flexeril to take at nighttime  EKG: Normal sinus rhythm. Normal axis, normal R wave progression, No acute ST elevation or depression.  Orders Today: Orders Placed This Encounter  Procedures  . Ambulatory referral to Cardiology    Referral Priority:  Routine    Referral Type:  Consultation    Referral Reason:  Specialty Services Required    Requested Specialty:  Cardiology    Number of Visits Requested:  1  . EKG 12-Lead    Medications Today: Meds ordered this encounter  Medications  . traMADol (ULTRAM) 50 MG tablet    Sig: Take 1 tablet (50 mg total) by mouth every 6 (six) hours as needed for pain.    Dispense:  50 tablet    Refill:  2  . cyclobenzaprine (FLEXERIL) 10 MG tablet    Sig: Take 1 tablet (10 mg total) by mouth 3 (three) times daily as needed for muscle spasms.    Dispense:  45 tablet    Refill:  2

## 2011-11-20 ENCOUNTER — Encounter: Payer: Self-pay | Admitting: Cardiovascular Disease

## 2011-11-20 ENCOUNTER — Ambulatory Visit (INDEPENDENT_AMBULATORY_CARE_PROVIDER_SITE_OTHER): Payer: Medicare Other | Admitting: Cardiovascular Disease

## 2011-11-20 ENCOUNTER — Institutional Professional Consult (permissible substitution): Payer: Medicare Other | Admitting: Cardiovascular Disease

## 2011-11-20 VITALS — BP 137/91 | HR 65 | Ht 67.0 in | Wt 223.4 lb

## 2011-11-20 DIAGNOSIS — R002 Palpitations: Secondary | ICD-10-CM | POA: Diagnosis not present

## 2011-11-20 DIAGNOSIS — R0609 Other forms of dyspnea: Secondary | ICD-10-CM

## 2011-11-20 DIAGNOSIS — R0600 Dyspnea, unspecified: Secondary | ICD-10-CM

## 2011-11-20 NOTE — Patient Instructions (Signed)
Your physician has requested that you have an echocardiogram. Echocardiography is a painless test that uses sound waves to create images of your heart. It provides your doctor with information about the size and shape of your heart and how well your heart's chambers and valves are working. This procedure takes approximately one hour. There are no restrictions for this procedure.  Your physician has recommended that you wear an event monitor. Event monitors are medical devices that record the heart's electrical activity. Doctors most often [place] these monitors to diagnose arrhythmias. Arrhythmias are problems with the speed or rhythm of the heartbeat. The monitor is a small, portable device. You can wear one while you do your normal daily activities. This is usually used to diagnose what is causing palpitations/syncope (passing out).  Your physician recommends that you return for lab work in: today, cbc, bmet, hfp, tsh   Your physician recommends that you schedule a follow-up appointment in: 2 months

## 2011-11-20 NOTE — Progress Notes (Signed)
[facility] File Date of Birth  [birth year] [place]  [address]    [place]   [place], [place]  [address]    [place], [place]  [address] [phone]  Fax  [phone]  [phone]  Fax [phone]  Problems: 1. SVT 2. Palpitations 3. Diabetes mellitus  History of Present Illness:  Laurence is a 66 y.o. female with hx of palpitations.  The episodes last for hours. They are associated with shortness of breath. She denies any dizziness.  they're occasionally associated with chest pain.   She does not go to the gym to exercise.  Current Outpatient Prescriptions on File Prior to Visit  Medication Sig Dispense Refill  . aspirin 81 MG tablet Take 81 mg by mouth daily.        . B-D ULTRAFINE III SHORT PEN 31G X 8 MM MISC USE AS DIRECTED  100 each  6  . betamethasone dipropionate (DIPROLENE) 0.05 % cream APPLY TO AFFECTED AREA TWICE A DAY AS DIRECTED  15 g  1  . Calcium Carbonate-Vitamin D (CALCIUM + D PO) Take by mouth.      . cyclobenzaprine (FLEXERIL) 10 MG tablet Take 1 tablet (10 mg total) by mouth 3 (three) times daily as needed for muscle spasms.  45 tablet  2  . fluticasone (FLONASE) 50 MCG/ACT nasal spray 2 sprays by Nasal route daily.        . furosemide (LASIX) 20 MG tablet TAKE 1 TABLET EVERY DAY  90 tablet  2  . glucose blood (ONE TOUCH TEST STRIPS) test strip 1 each by Other route as needed. Use as instructed       . insulin glargine (LANTUS SOLOSTAR) 100 UNIT/ML injection Inject 80 Units into the skin 2 (two) times daily.  40 mL  11  . metoprolol (LOPRESSOR) 50 MG tablet TAKE 1 TABLET TWICE DAILY  180 tablet  1  . NEXIUM 40 MG capsule TAKE 1 CAPSULE BY MOUTH DAILY  90 capsule  1  . ONE TOUCH ULTRA TEST test strip USE AS DIRECTED  100 each  6  . temazepam (RESTORIL) 15 MG capsule Take 15 mg by mouth. 1-2 by mouth for sleep as needed       . traMADol (ULTRAM) 50 MG tablet Take 1 tablet (50 mg total) by mouth every 6  (six) hours as needed for pain.  50 tablet  2    Allergies  Allergen Reactions  . Epinephrine Hcl     REACTION: Had tremors when it was given.  . Erythromycin Ethylsuccinate     REACTION: abd spasms  . Iohexol      Code: HIVES, Desc: pt needs premedicated-- hives on prev contrast study per md office     Past Medical History  Diagnosis Date  . Acute cystitis   . Acute pharyngitis   . Other diseases of lung, not elsewhere classified   . Cyst and pseudocyst of pancreas   . Polyneuropathy in diabetes   . Pain in joint, lower leg   . Type II or unspecified type diabetes mellitus with neurological manifestations, not stated as uncontrolled   . Edema   . Dysuria   . Unspecified disorder of liver   . Periapical abscess without sinus   . Depressive disorder, not elsewhere classified   . Benign neoplasm of colon   . Unspecified sleep apnea   . Obesity, unspecified   . Pure hypercholesterolemia   .  Unspecified urinary incontinence   . Other specified cardiac dysrhythmias   . Irritable bowel syndrome   . Diverticulosis of colon (without mention of hemorrhage)   . Dysthymic disorder   . Essential and other specified forms of tremor   . Esophageal reflux   . Allergic rhinitis, cause unspecified   . Type II or unspecified type diabetes mellitus without mention of complication, not stated as uncontrolled   . Abnormal abdominal CT scan 04/2006    slight abn liver  . Abnormal CT scan, pancreas or bile duct 04/2006    cyst  . Abnormal chest CT 10/07    unable to bx    Past Surgical History  Procedure Date  . Rotator cuff repair 2005  . Kidney stone surgery 1980's    removal  . Partial hysterectomy 1990's  . 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 Cholecystectomy     History  Smoking status  . Never Smoker   Smokeless tobacco  . Not on file    History  Alcohol Use: Not on file    Family History  Problem Relation Age of Onset  . Cirrhosis Father   . Diabetes      2  aunts  . Clotting disorder Mother   . Cancer      grandmother  . Allergies      "whole family"    Reviw of Systems:  Reviewed in the HPI.  All other systems are negative.  Physical Exam: Blood pressure 137/91, pulse 65, height 5' 7"  (1.702 m), weight 223 lb 6.4 oz (101.334 kg). General: Well developed, well nourished, in no acute distress.  Head: Normocephalic, atraumatic, sclera non-icteric, mucus membranes are moist,   Neck: Supple. Carotids are 2 + without bruits. No JVD  Lungs: Clear bilaterally to auscultation.  Heart: regular rate.  normal  S1 S2. No murmurs, gallops or rubs.  Abdomen: Soft, non-tender, non-distended with normal bowel sounds. No hepatomegaly. No rebound/guarding. No masses.  Msk:  Strength and tone are normal  Extremities: No clubbing or cyanosis. No edema.  Distal pedal pulses are 2+ and equal bilaterally.  Neuro: Alert and oriented X 3. Moves all extremities spontaneously.  Psych:  Responds to questions appropriately with a normal affect.  ECG: 11/18/11---Sinus  Rhythm  - occasional PAC    -Left atrial enlargement.     Assessment / Plan:

## 2011-11-20 NOTE — Assessment & Plan Note (Signed)
Pt presents with palpitations.  Has a history of supraventricular tachycardia that some of these palpitations sound more consistent with premature ventricular contractions.  We'll check baseline labs including a TSH, CBC, basic metabolic profile, and hepatic profile. We will get an echocardiogram for further evaluation of her left ventricular function. We'll also place a 30 day event monitor. I'll see her again in 2 months for followup visit.  I've asked her to start an exercise program and to walk up to 2 miles every day. I think this will help her in many aspects.

## 2011-11-21 LAB — CBC WITH DIFFERENTIAL/PLATELET
Basophils Relative: 0.3 % (ref 0.0–3.0)
Eosinophils Relative: 2.4 % (ref 0.0–5.0)
HCT: 42.2 % (ref 36.0–46.0)
Hemoglobin: 14.3 g/dL (ref 12.0–15.0)
Lymphs Abs: 2.1 10*3/uL (ref 0.7–4.0)
MCV: 92.6 fl (ref 78.0–100.0)
Monocytes Absolute: 0.4 10*3/uL (ref 0.1–1.0)
Monocytes Relative: 5.3 % (ref 3.0–12.0)
Neutro Abs: 4.6 10*3/uL (ref 1.4–7.7)
RBC: 4.55 Mil/uL (ref 3.87–5.11)
WBC: 7.3 10*3/uL (ref 4.5–10.5)

## 2011-11-21 LAB — BASIC METABOLIC PANEL
BUN: 17 mg/dL (ref 6–23)
CO2: 29 mEq/L (ref 19–32)
Chloride: 104 mEq/L (ref 96–112)
Creatinine, Ser: 0.8 mg/dL (ref 0.4–1.2)
Glucose, Bld: 166 mg/dL — ABNORMAL HIGH (ref 70–99)

## 2011-11-21 LAB — TSH: TSH: 1.07 u[IU]/mL (ref 0.35–5.50)

## 2011-11-21 LAB — HEPATIC FUNCTION PANEL
ALT: 98 U/L — ABNORMAL HIGH (ref 0–35)
Albumin: 3.9 g/dL (ref 3.5–5.2)
Bilirubin, Direct: 0.1 mg/dL (ref 0.0–0.3)
Total Protein: 7.2 g/dL (ref 6.0–8.3)

## 2011-11-26 ENCOUNTER — Encounter (INDEPENDENT_AMBULATORY_CARE_PROVIDER_SITE_OTHER): Payer: Medicare Other

## 2011-11-26 DIAGNOSIS — R0600 Dyspnea, unspecified: Secondary | ICD-10-CM

## 2011-11-26 DIAGNOSIS — R002 Palpitations: Secondary | ICD-10-CM | POA: Diagnosis not present

## 2011-12-03 ENCOUNTER — Other Ambulatory Visit: Payer: Self-pay

## 2011-12-03 ENCOUNTER — Ambulatory Visit (HOSPITAL_COMMUNITY): Payer: Medicare Other | Attending: Cardiology

## 2011-12-03 DIAGNOSIS — R0989 Other specified symptoms and signs involving the circulatory and respiratory systems: Secondary | ICD-10-CM | POA: Insufficient documentation

## 2011-12-03 DIAGNOSIS — I517 Cardiomegaly: Secondary | ICD-10-CM | POA: Diagnosis not present

## 2011-12-03 DIAGNOSIS — I059 Rheumatic mitral valve disease, unspecified: Secondary | ICD-10-CM | POA: Insufficient documentation

## 2011-12-03 DIAGNOSIS — R0600 Dyspnea, unspecified: Secondary | ICD-10-CM

## 2011-12-03 DIAGNOSIS — E119 Type 2 diabetes mellitus without complications: Secondary | ICD-10-CM | POA: Insufficient documentation

## 2011-12-03 DIAGNOSIS — R0609 Other forms of dyspnea: Secondary | ICD-10-CM | POA: Insufficient documentation

## 2011-12-03 DIAGNOSIS — R002 Palpitations: Secondary | ICD-10-CM | POA: Diagnosis not present

## 2011-12-06 ENCOUNTER — Telehealth: Payer: Self-pay | Admitting: Cardiovascular Disease

## 2011-12-06 MED ORDER — VERAPAMIL HCL ER 240 MG PO TBCR: 240.0000 mg | EXTENDED_RELEASE_TABLET | Freq: Every day | ORAL | Status: DC

## 2011-12-06 NOTE — Telephone Encounter (Signed)
New Problem:    Patient called returning your call.  Please call back.

## 2011-12-06 NOTE — Telephone Encounter (Signed)
PT TO STOP METOPROLOL AND START VERAPAMIL CR 240 MG, PT CONTINUES TO HAVE PALPITATIONS AND HAS BEEN ON METOPROLOL FOR AWHILE WITHOUT REDUCING THE PALPITATIONS NOW. PT TO CALL ME NEXT Monday 5/20 OR Tuesday 5/21TO UPDATE ME. PT AGREED WITH PLAN.

## 2011-12-10 ENCOUNTER — Telehealth: Payer: Self-pay | Admitting: *Deleted

## 2011-12-10 ENCOUNTER — Telehealth: Payer: Self-pay | Admitting: Cardiovascular Disease

## 2011-12-10 ENCOUNTER — Encounter: Payer: Self-pay | Admitting: Cardiovascular Disease

## 2011-12-10 NOTE — Telephone Encounter (Deleted)
Pt would like to talk to you about her medication

## 2011-12-10 NOTE — Telephone Encounter (Signed)
Per Dr Elmarie Shiley review, echo stable/ monitor shows PVC's and PAC's, no explaination for her deep sense of fatigue, asked her to f/u with her PCP, pt agreed to plan.

## 2011-12-10 NOTE — Telephone Encounter (Signed)
Pt complains of extreme fatigue, wants to know if anything is being shown on monitor and states the new med change hasn't done anything for the palpitations? States- Im wiped out/ yesterday and day prior was the worse. Told pt I would get information and discuss with Dr Acie Fredrickson and call her back this afternoon. Pt agreed to plan.

## 2011-12-10 NOTE — Telephone Encounter (Signed)
Pt would like to talk to you about her medication

## 2011-12-10 NOTE — Telephone Encounter (Signed)
This is wrong chart, pt is [place], Elinor Dodge, see that note.

## 2011-12-10 NOTE — Telephone Encounter (Signed)
Done,

## 2011-12-27 ENCOUNTER — Telehealth: Payer: Self-pay | Admitting: *Deleted

## 2011-12-27 NOTE — Telephone Encounter (Signed)
msg left on personal cell, normal monitor results, pvc/pac. Call with further questions/ number provided

## 2012-01-20 ENCOUNTER — Other Ambulatory Visit: Payer: Self-pay | Admitting: *Deleted

## 2012-01-21 ENCOUNTER — Other Ambulatory Visit: Payer: Self-pay | Admitting: *Deleted

## 2012-01-21 NOTE — Telephone Encounter (Signed)
Received refill request for metoprolol 50 mg  Bid looks like patient was given verapamil 253m 1 tab at bedtime in may and told to stop metoprolol. I spoke with patient and she said that you told her to go back on metoprolol b/c verapamil was not working. Patient was advised I would send message to cardiologist and let you make decision to refill this medication or not.

## 2012-01-24 ENCOUNTER — Encounter: Payer: Self-pay | Admitting: Internal Medicine

## 2012-01-24 ENCOUNTER — Ambulatory Visit (INDEPENDENT_AMBULATORY_CARE_PROVIDER_SITE_OTHER): Payer: Medicare Other | Admitting: Internal Medicine

## 2012-01-24 VITALS — BP 124/78 | HR 61 | Ht 65.0 in | Wt 222.0 lb

## 2012-01-24 DIAGNOSIS — J984 Other disorders of lung: Secondary | ICD-10-CM | POA: Diagnosis not present

## 2012-01-24 DIAGNOSIS — G4730 Sleep apnea, unspecified: Secondary | ICD-10-CM

## 2012-01-24 MED ORDER — METOPROLOL TARTRATE 50 MG PO TABS: 50.0000 mg | ORAL_TABLET | Freq: Two times a day (BID) | ORAL | Status: DC

## 2012-01-24 NOTE — Telephone Encounter (Signed)
Pt had went back to metoprolol and states it is helping her palpitations in comparison to the verapamil. MAR updated.

## 2012-01-24 NOTE — Patient Instructions (Addendum)
Order- [facility]- replacement CPAP machine, mask of choice, humidifier and supplies     DX OSA

## 2012-01-24 NOTE — Progress Notes (Signed)
01/24/12- 52 yoF never smoker last here in the way of 2000 and which was being followed for obstructive sleep apnea and a right lower lobe nodule. She comes now to reestablish. She needs replacement CPAP machine. PCP Dr Diona Browner NPSG 07/31/03- AHI 42/ hr, CPAP then titrated to 9. Weight was 200 lbs. She had been using CPAP 6-8/[facility] with good compliance and control. Evaluation in 2010 had indicated stable lung nodules in RLL. She now reports a recent chest cold which has left her with cough productive of thick white phlegm. She thinks it will clear up on its own. Bedtime 11:50 PM, short sleep latency, waking 3 times during the night before up between 6 and 7 AM. CT chest w/o cm 01/21/11-we reviewed the images and discussed the right lower lobe nodular density interpreted as mucous plug. IMPRESSION:  1. Subpleural scarring in the right lower lobe, adjacent to  prominent osteophytes.  2. Probable chronic mucoid impaction in the medial right lower  lobe, unchanged from 05/15/2006.  3. 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  Original Report Authenticated By: Luretha Rued, M.D.   Prior to Admission medications   Medication Sig Start Date End Date Taking? Authorizing Provider  aspirin 81 MG tablet Take 81 mg by mouth daily.     Yes Historical Provider, MD  B-D ULTRAFINE III SHORT PEN 31G X 8 MM MISC USE AS DIRECTED 04/09/11  Yes Amy E Bedsole, MD  betamethasone dipropionate (DIPROLENE) 0.05 % cream APPLY TO AFFECTED AREA TWICE A DAY AS DIRECTED 08/27/11  Yes Amy Cletis [place], MD  Calcium Carbonate-Vitamin D (CALCIUM + D PO) Take by mouth.   Yes Historical Provider, MD  fluticasone (FLONASE) 50 MCG/ACT nasal spray 2 sprays by Nasal route daily.     Yes Historical Provider, MD  furosemide (LASIX) 20 MG tablet TAKE 1 TABLET EVERY DAY 12/20/10  Yes Amy E Bedsole, MD  glucose blood (ONE TOUCH TEST STRIPS) test strip 1 each by Other route as needed. Use as  instructed    Yes Historical Provider, MD  insulin glargine (LANTUS SOLOSTAR) 100 UNIT/ML injection Inject 80 Units into the skin 2 (two) times daily. 03/07/11 03/06/12 Yes Amy Cletis [place], MD  Magnesium 100 MG TABS Take 500 mg by mouth 2 (two) times daily.   Yes Historical Provider, MD  metoprolol (LOPRESSOR) 50 MG tablet Take 1 tablet (50 mg total) by mouth 2 (two) times daily. 01/24/12 01/23/13 Yes Thayer Headings, MD  Multiple Vitamins-Minerals (ZINC PO) Take 1 tablet by mouth 2 (two) times daily.   Yes Historical Provider, MD  NEXIUM 40 MG capsule TAKE 1 CAPSULE BY MOUTH DAILY 08/27/11  Yes Amy Cletis [place], MD  ONE TOUCH ULTRA TEST test strip USE AS DIRECTED 11/14/10  Yes Amy E Bedsole, MD  temazepam (RESTORIL) 15 MG capsule Take 15 mg by mouth. 1-2 by mouth for sleep as needed    Yes Historical Provider, MD  traMADol (ULTRAM) 50 MG tablet Take 1 tablet by mouth as needed. 11/18/11  Yes Historical Provider, MD   Past Medical History  Diagnosis Date  . Acute cystitis   . Acute pharyngitis   . Other diseases of lung, not elsewhere classified   . Cyst and pseudocyst of pancreas   . Polyneuropathy in diabetes   . Pain in joint, lower leg   . Type II or unspecified type diabetes mellitus with neurological manifestations, not stated as uncontrolled   . Edema   .  Dysuria   . Unspecified disorder of liver   . Periapical abscess without sinus   . Depressive disorder, not elsewhere classified   . Benign neoplasm of colon   . Unspecified sleep apnea   . Obesity, unspecified   . Pure hypercholesterolemia   . Unspecified urinary incontinence   . Other specified cardiac dysrhythmias   . Irritable bowel syndrome   . Diverticulosis of colon (without mention of hemorrhage)   . Dysthymic disorder   . Essential and other specified forms of tremor   . Esophageal reflux   . Allergic rhinitis, cause unspecified   . Type II or unspecified type diabetes mellitus without mention of complication, not stated as  uncontrolled   . Abnormal abdominal CT scan 04/2006    slight abn liver  . Abnormal CT scan, pancreas or bile duct 04/2006    cyst  . Abnormal chest CT 10/07    unable to bx  . Sleep apnea    Past Surgical History  Procedure Date  . Rotator cuff repair 2005  . Kidney stone surgery 1980's    removal  . Partial hysterectomy 1990's  . 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 Cholecystectomy     15-20 years ago   Family History  Problem Relation Age of Onset  . Cirrhosis Father   . Diabetes      2 aunts  . Clotting disorder Mother   . Cancer      grandmother  . Allergies      "whole family"  . Asthma      "whole family"   History   Social History  . Marital Status: Married    Spouse Name: N/A    Number of Children: 50  . Years of Education: N/A   Occupational History  .     Social History Main Topics  . Smoking status: Never Smoker   . Smokeless tobacco: Not on file  . Alcohol Use: Not on file  . Drug Use: Not on file  . Sexually Active: Not on file   Other Topics Concern  . Not on file   Social History Narrative   Husband treated for testicular cancer, 2 cups coffee a day, no exercise   ROS-see HPI Constitutional:   No-   weight loss, night sweats, fevers, chills, fatigue, lassitude. HEENT:   No-  headaches, difficulty swallowing, tooth/dental problems, sore throat,       No-  sneezing, itching, ear ache, nasal congestion, post nasal drip,  CV:  No-   chest pain, orthopnea, PND, swelling in lower extremities, anasarca, dizziness, palpitations Resp: +  shortness of breath with exertion or at rest.              +  productive cough,  No non-productive cough,  No- coughing up of blood.              +  change in color of mucus.  No- wheezing.   Skin: + rash or lesions. GI:  + heartburn, indigestion, abdominal pain, nausea, vomiting, diarrhea,                 change in bowel habits, loss of appetite GU: No-   dysuria, change in color of urine, no urgency or  frequency.  No- flank pain. MS:  +  joint pain or swelling.  No- decreased range of motion.  No- back pain. Neuro-     nothing unusual Psych:  No- change in mood or  affect. No depression or anxiety.  No memory loss.  OBJ- Physical Exam General- Alert, Oriented, Affect-appropriate, Distress- none acute, obese Skin- rash-none, lesions- none, excoriation- none Lymphadenopathy- none Head- atraumatic            Eyes- Gross vision intact, PERRLA, conjunctivae and secretions clear            Ears- Hearing, canals-normal            Nose- Clear, no-Septal dev, mucus, polyps, erosion, perforation             Throat- Mallampati III , mucosa clear , drainage- none, tonsils- atrophic Neck- flexible , trachea midline, no stridor , thyroid nl, carotid no bruit Chest - symmetrical excursion , unlabored           Heart/CV- RRR , no murmur , no gallop  , no rub, nl s1 s2                           - JVD- none , edema- none, stasis changes- none, varices- none           Lung- clear to P&A, wheeze- none, cough- none , dullness-none, rub- none           Chest wall-  Abd- tender-no, distended-no, bowel sounds-present, HSM- no Br/ Gen/ Rectal- Not done, not indicated Extrem- cyanosis- none, clubbing, none, atrophy- none, strength- nl Neuro- grossly intact to observation

## 2012-01-26 NOTE — Assessment & Plan Note (Signed)
Low concern. Discussed imaging followup over time.

## 2012-01-26 NOTE — Assessment & Plan Note (Signed)
She needs a replacement machine. We will have [facility] set the same pressure their records show her to be on and then get that documentation for our record. Weight loss is advised

## 2012-01-27 ENCOUNTER — Telehealth: Payer: Self-pay | Admitting: Internal Medicine

## 2012-01-27 ENCOUNTER — Ambulatory Visit (INDEPENDENT_AMBULATORY_CARE_PROVIDER_SITE_OTHER): Payer: Medicare Other | Admitting: Cardiovascular Disease

## 2012-01-27 ENCOUNTER — Encounter: Payer: Self-pay | Admitting: Cardiovascular Disease

## 2012-01-27 VITALS — BP 140/74 | HR 78 | Ht 65.0 in | Wt 221.4 lb

## 2012-01-27 DIAGNOSIS — R002 Palpitations: Secondary | ICD-10-CM

## 2012-01-27 NOTE — Patient Instructions (Addendum)
Your physician recommends that you schedule a follow-up appointment in: as needed basis

## 2012-01-27 NOTE — Progress Notes (Signed)
[name] [name] Date of Birth  1946-01-03 [place]  [address]    [place]   [place], [place]  [address]    [place], [place]  [address] [phone]  Fax  [phone]  [phone]  Fax [phone]  Problems: 1. SVT 2. Palpitations 3. Diabetes mellitus  History of Present Illness:  [name] [name] is a 66 y.o. female with hx of palpitations.  The episodes last for hours. They are associated with shortness of breath. She denies any dizziness.  they're occasionally associated with chest pain.   She does not go to the gym to exercise.  We'll place an event monitor on her during her last visit. She has predominantly normal sinus rhythm with occasional premature atrial contractions and premature ventricular contractions.  Her echocardiogram revealed normal left ventricular systolic function with abnormal relaxation consistent with diastolic dysfunction.  She has been working out in the yard and is feeling better.  We tried Verapamil instead of metoprolol but she did not do as well with that.  We have changed back to the metoprolol and she seems to be getting along  Current Outpatient Prescriptions on [name] Prior to Visit  Medication Sig Dispense Refill  . aspirin 81 MG tablet Take 81 mg by mouth daily.        . B-D ULTRAFINE III SHORT PEN 31G X 8 MM MISC USE AS DIRECTED  100 each  6  . betamethasone dipropionate (DIPROLENE) 0.05 % cream APPLY TO AFFECTED AREA TWICE A DAY AS DIRECTED  15 g  1  . Calcium Carbonate-Vitamin D (CALCIUM + D PO) Take 1,200 mg by mouth 2 (two) times daily.       . fluticasone (FLONASE) 50 MCG/ACT nasal spray 2 sprays by Nasal route daily.        . furosemide (LASIX) 20 MG tablet TAKE 1 TABLET EVERY DAY  90 tablet  2  . glucose blood (ONE TOUCH TEST STRIPS) test strip 1 each by Other route as needed. Use as instructed       . insulin glargine (LANTUS SOLOSTAR) 100 UNIT/ML injection Inject 80 Units into the skin 2  (two) times daily.  40 mL  11  . Magnesium 100 MG TABS Take 500 mg by mouth 2 (two) times daily.      . metoprolol (LOPRESSOR) 50 MG tablet Take 1 tablet (50 mg total) by mouth 2 (two) times daily.  180 tablet  3  . Multiple Vitamins-Minerals (ZINC PO) Take 1 tablet by mouth 2 (two) times daily.      Marland Kitchen NEXIUM 40 MG capsule TAKE 1 CAPSULE BY MOUTH DAILY  90 capsule  1  . ONE TOUCH ULTRA TEST test strip USE AS DIRECTED  100 each  6  . temazepam (RESTORIL) 15 MG capsule Take 15 mg by mouth. 1-2 by mouth for sleep as needed       . traMADol (ULTRAM) 50 MG tablet Take 1 tablet by mouth as needed.        Allergies  Allergen Reactions  . Epinephrine Hcl     REACTION: Had tremors when it was given.  . Erythromycin Ethylsuccinate     REACTION: abd spasms  . Iohexol      Code: HIVES, Desc: pt needs premedicated-- hives on prev contrast study per md office     Past Medical History  Diagnosis Date  . Acute cystitis   . Acute pharyngitis   .  Other diseases of lung, not elsewhere classified   . Cyst and pseudocyst of pancreas   . Polyneuropathy in diabetes   . Pain in joint, lower leg   . Type II or unspecified type diabetes mellitus with neurological manifestations, not stated as uncontrolled   . Edema   . Dysuria   . Unspecified disorder of liver   . Periapical abscess without sinus   . Depressive disorder, not elsewhere classified   . Benign neoplasm of colon   . Unspecified sleep apnea   . Obesity, unspecified   . Pure hypercholesterolemia   . Unspecified urinary incontinence   . Other specified cardiac dysrhythmias   . Irritable bowel syndrome   . Diverticulosis of colon (without mention of hemorrhage)   . Dysthymic disorder   . Essential and other specified forms of tremor   . Esophageal reflux   . Allergic rhinitis, cause unspecified   . Type II or unspecified type diabetes mellitus without mention of complication, not stated as uncontrolled   . Abnormal abdominal CT scan  04/2006    slight abn liver  . Abnormal CT scan, pancreas or bile duct 04/2006    cyst  . Abnormal chest CT 10/07    unable to bx  . Sleep apnea     Past Surgical History  Procedure Date  . Rotator cuff repair 2005  . Kidney stone surgery 1980's    removal  . Partial hysterectomy 1990's  . 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 Cholecystectomy     15-20 years ago    History  Smoking status  . Never Smoker   Smokeless tobacco  . Not on [name]    History  Alcohol Use: Not on [name]    Family History  Problem Relation Age of Onset  . Cirrhosis Father   . Diabetes      2 aunts  . Clotting disorder Mother   . Cancer      grandmother  . Allergies      "whole family"  . Asthma      "whole family"    Reviw of Systems:  Reviewed in the HPI.  All other systems are negative.  Physical Exam: Blood pressure 140/74, pulse 78, height 5' 5"  (1.651 m), weight 221 lb 6.4 oz (100.426 kg). General: Well developed, well nourished, in no acute distress.  Head: Normocephalic, atraumatic, sclera non-icteric, mucus membranes are moist,   Neck: Supple. Carotids are 2 + without bruits. No JVD  Lungs: Clear bilaterally to auscultation.  Heart: regular rate.  normal  S1 S2. No murmurs, gallops or rubs.  Abdomen: Soft, non-tender, non-distended with normal bowel sounds. No hepatomegaly. No rebound/guarding. No masses.  Msk:  Strength and tone are normal  Extremities: No clubbing or cyanosis. No edema.  Distal pedal pulses are 2+ and equal bilaterally.  Neuro: Alert and oriented X 3. Moves all extremities spontaneously.  Psych:  Responds to questions appropriately with a normal affect.  ECG: 11/18/11---Sinus  Rhythm  - occasional PAC    -Left atrial enlargement.     Assessment / Plan:

## 2012-01-27 NOTE — Assessment & Plan Note (Signed)
The event monitor revealed NSR with PACs and PVCs.  Her echo revealed normal LV systolic function with some diastolic dysfunction.  I have asked her to exercise on a regular basis and to try to lose some weight.  I will see her back on an as needed basis.

## 2012-01-27 NOTE — Telephone Encounter (Signed)
Error.[name] [name] ° °

## 2012-02-03 ENCOUNTER — Encounter: Payer: Self-pay | Admitting: Internal Medicine

## 2012-02-29 ENCOUNTER — Other Ambulatory Visit: Payer: Self-pay | Admitting: Family Medicine

## 2012-03-04 ENCOUNTER — Telehealth: Payer: Self-pay | Admitting: Internal Medicine

## 2012-03-04 NOTE — Telephone Encounter (Signed)
Called, spoke with pt who states during her last OV in July with Dr. Annamaria Boots he mentioned he would look over her last sleep study and send to [facility].  States Dr.  Annamaria Boots did not think she would need another sleep study but he was going to look at it.  States this was done "years ago."  Would like to know results and have them sent to [facility] as [facility] is telling her they have not been received.  Dr. Annamaria Boots, pls advise if you have looked at these results yet.  Thank you.  Note:  There is a sleep study scanned into chart from 2005.

## 2012-03-06 NOTE — Telephone Encounter (Signed)
Her sleep study of 07/31/03 had documented severe obstructive sleep apnea with AHI 42 episodes of sleep apnea per hour. That documentation is sent to [facility]. Do they need a specific order to do anything?

## 2012-03-06 NOTE — Telephone Encounter (Signed)
I called [facility] to verify they received the sleep study and spoke with Candace and she states she has not received this so I have re faxed the study to [phone]. I also sent a copy of this phone note to [facility] as well. Pt is aware. Bing, CMA

## 2012-03-10 ENCOUNTER — Other Ambulatory Visit: Payer: Self-pay | Admitting: *Deleted

## 2012-03-10 NOTE — Telephone Encounter (Signed)
[facility]erdue for CPX and DM check.. Schedule and refill utill then.

## 2012-03-11 MED ORDER — TEMAZEPAM 15 MG PO CAPS: ORAL_CAPSULE | ORAL | Status: DC

## 2012-03-11 NOTE — Telephone Encounter (Signed)
appt scheduled 06-25-2012

## 2012-03-16 MED ORDER — TEMAZEPAM 15 MG PO CAPS: 15.0000 mg | ORAL_CAPSULE | Freq: Every evening | ORAL | Status: DC | PRN

## 2012-03-16 NOTE — Addendum Note (Signed)
Addended by: Despina Hidden on: 03/16/2012 05:04 PM   Modules accepted: Orders

## 2012-03-16 NOTE — Telephone Encounter (Signed)
rx called into pharmacy

## 2012-03-26 ENCOUNTER — Other Ambulatory Visit: Payer: Self-pay | Admitting: Internal Medicine

## 2012-03-26 MED ORDER — INSULIN GLARGINE 100 UNIT/ML ~~LOC~~ SOLN: 80.0000 [IU] | Freq: Two times a day (BID) | SUBCUTANEOUS | Status: DC

## 2012-03-30 ENCOUNTER — Other Ambulatory Visit: Payer: Self-pay | Admitting: *Deleted

## 2012-03-30 MED ORDER — INSULIN GLARGINE 100 UNIT/ML ~~LOC~~ SOLN: 80.0000 [IU] | Freq: Two times a day (BID) | SUBCUTANEOUS | Status: DC

## 2012-06-11 ENCOUNTER — Other Ambulatory Visit: Payer: Self-pay | Admitting: *Deleted

## 2012-06-11 MED ORDER — FUROSEMIDE 20 MG PO TABS: ORAL_TABLET | ORAL | Status: DC

## 2012-06-25 ENCOUNTER — Encounter: Payer: Medicare Other | Admitting: Family Medicine

## 2012-06-30 ENCOUNTER — Ambulatory Visit (INDEPENDENT_AMBULATORY_CARE_PROVIDER_SITE_OTHER): Payer: Medicare Other | Admitting: Internal Medicine

## 2012-06-30 ENCOUNTER — Encounter: Payer: Self-pay | Admitting: Internal Medicine

## 2012-06-30 VITALS — BP 126/60 | HR 59 | Ht 65.0 in | Wt 220.6 lb

## 2012-06-30 DIAGNOSIS — G4733 Obstructive sleep apnea (adult) (pediatric): Secondary | ICD-10-CM | POA: Diagnosis not present

## 2012-06-30 DIAGNOSIS — J309 Allergic rhinitis, unspecified: Secondary | ICD-10-CM | POA: Diagnosis not present

## 2012-06-30 DIAGNOSIS — Z23 Encounter for immunization: Secondary | ICD-10-CM | POA: Diagnosis not present

## 2012-06-30 MED ORDER — AZELASTINE-FLUTICASONE 137-50 MCG/ACT NA SUSP: 2.0000 | Freq: Every day | NASAL | Status: DC

## 2012-06-30 MED ORDER — CEFDINIR 300 MG PO CAPS: 300.0000 mg | ORAL_CAPSULE | Freq: Two times a day (BID) | ORAL | Status: DC

## 2012-06-30 NOTE — Progress Notes (Signed)
01/24/12- 45 yoF never smoker last here in the way of 2000  was being followed for obstructive sleep apnea and a right lower lobe nodule. She comes now to reestablish. She needs replacement CPAP machine. PCP Dr Diona Browner NPSG 07/31/03- AHI 42/ hr, CPAP then titrated to 9. Weight was 200 lbs. She had been using CPAP 6-8/[facility] with good compliance and control. Evaluation in 2010 had indicated stable lung nodules in RLL. She now reports a recent chest cold which has left her with cough productive of thick white phlegm. She thinks it will clear up on its own. Bedtime 11:50 PM, short sleep latency, waking 3 times during the night before up between 6 and 7 AM. CT chest w/o cm 01/21/11-we reviewed the images and discussed the right lower lobe nodular density interpreted as mucous plug. IMPRESSION:  1. Subpleural scarring in the right lower lobe, adjacent to  prominent osteophytes.  2. Probable chronic mucoid impaction in the medial right lower  lobe, unchanged from 05/15/2006.  3. 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  Original Report Authenticated By: Luretha Rued, M.D.   06/30/12- 66 yoF never smoker followed for OSA, hx RLL nodule/ mucus plug FOLLOWS FOR: still needs new machine-[facility] telling her that her machine is not broken(unfixable). CPAP 6-8[facility]. Since she got her new mask she has wakened sometimes in a panic. Nasal pillows don't fit right. Gets frontal headache lying down with CPAP on. Phlegm in her throat in the morning. The machine is more than 66 years old and options are not working reliably.  ROS-see HPI Constitutional:   No-   weight loss, night sweats, fevers, chills, fatigue, lassitude. HEENT:   +headaches, no-difficulty swallowing, tooth/dental problems, sore throat,       No-  sneezing, itching, ear ache, nasal congestion, post nasal drip,  CV:  No-   chest pain, orthopnea, PND, swelling in lower extremities, anasarca, dizziness,  palpitations Resp: No- shortness of breath with exertion or at rest.              No- productive cough,  No non-productive cough,  No- coughing up of blood.              No- change in color of mucus.  No- wheezing.   Skin: No- rash or lesions. GI:  No- heartburn, indigestion, abdominal pain, nausea, vomiting,  GU:  MS:  +  joint pain or swelling.   Neuro-     nothing unusual Psych:  No- change in mood or affect. No depression or anxiety.  No memory loss.  OBJ- Physical Exam General- Alert, Oriented, Affect-appropriate, Distress- none acute, overweight Skin- rash-none, lesions- none, excoriation- none Lymphadenopathy- none Head- atraumatic            Eyes- Gross vision intact, PERRLA, conjunctivae and secretions clear            Ears- Hearing, canals-normal            Nose- + turbinate edema, no-Septal dev, mucus, polyps, erosion, perforation             Throat- Mallampati III , mucosa clear , drainage- none, tonsils- atrophic Neck- flexible , trachea midline, no stridor , thyroid nl, carotid no bruit Chest - symmetrical excursion , unlabored           Heart/CV- RRR , no murmur , no gallop  , no rub, nl s1 s2                           -  JVD- none , edema- none, stasis changes- none, varices- none           Lung- clear to P&A, wheeze- none, cough- none , dullness-none, rub- none           Chest wall-  Abd-  Br/ Gen/ Rectal- Not done, not indicated Extrem- cyanosis- none, clubbing, none, atrophy- none, strength- nl Neuro- grossly intact to observation

## 2012-06-30 NOTE — Patient Instructions (Addendum)
I suggest you show your CPAP machine and mask to the people at [facility]. They can check the machine, and they can better advise you on mask options based on your experience with this one.   Sample Dymista nasal spray   1-2 puffs each nostril once daily at bedtime     Try this as an alternative to flonase for comparison.    Script for cefdinir antibiotic- to see if there is a bit of sinus infection  Sample saline nasal gel   FLU VAX

## 2012-07-03 ENCOUNTER — Encounter: Payer: Self-pay | Admitting: Family Medicine

## 2012-07-03 ENCOUNTER — Ambulatory Visit (INDEPENDENT_AMBULATORY_CARE_PROVIDER_SITE_OTHER): Payer: Medicare Other | Admitting: Family Medicine

## 2012-07-03 ENCOUNTER — Ambulatory Visit (INDEPENDENT_AMBULATORY_CARE_PROVIDER_SITE_OTHER)
Admission: RE | Admit: 2012-07-03 | Discharge: 2012-07-03 | Disposition: A | Discharge status: Home / Self Care | Payer: Medicare Other | Source: Ambulatory Visit | Attending: Family Medicine | Admitting: Family Medicine

## 2012-07-03 VITALS — BP 130/70 | HR 62 | Temp 98.1°F | Ht 65.0 in | Wt 218.8 lb

## 2012-07-03 DIAGNOSIS — E119 Type 2 diabetes mellitus without complications: Secondary | ICD-10-CM

## 2012-07-03 DIAGNOSIS — E7800 Pure hypercholesterolemia, unspecified: Secondary | ICD-10-CM

## 2012-07-03 DIAGNOSIS — M545 Low back pain: Secondary | ICD-10-CM

## 2012-07-03 DIAGNOSIS — M5137 Other intervertebral disc degeneration, lumbosacral region: Secondary | ICD-10-CM | POA: Diagnosis not present

## 2012-07-03 DIAGNOSIS — E559 Vitamin D deficiency, unspecified: Secondary | ICD-10-CM

## 2012-07-03 LAB — LIPID PANEL
HDL: 34.6 mg/dL — ABNORMAL LOW (ref >39.00)
LDL Cholesterol: 123 mg/dL — ABNORMAL HIGH (ref 0–99)
Total CHOL/HDL Ratio: 6
VLDL: 35 mg/dL (ref 0.0–40.0)

## 2012-07-03 LAB — COMPREHENSIVE METABOLIC PANEL
ALT: 117 U/L — ABNORMAL HIGH (ref 0–35)
Albumin: 3.7 g/dL (ref 3.5–5.2)
Alkaline Phosphatase: 103 U/L (ref 39–117)
Potassium: 4.2 mEq/L (ref 3.5–5.1)
Sodium: 138 mEq/L (ref 135–145)
Total Bilirubin: 1.3 mg/dL — ABNORMAL HIGH (ref 0.3–1.2)
Total Protein: 7.3 g/dL (ref 6.0–8.3)

## 2012-07-03 MED ORDER — INSULIN ASPART 100 UNIT/ML ~~LOC~~ SOLN: 5.0000 [IU] | Freq: Three times a day (TID) | SUBCUTANEOUS | Status: DC

## 2012-07-03 NOTE — Patient Instructions (Addendum)
Stop at lab on your way out. Stop at X-ray.. We will call with results.  Continue Lantus  60 units twice daily. Add novolog 5 units before meals three times a day. Get back on track with diet and exercise and weight loss. Check blood sugar daily fasting and once a day 2 hours  after meal, record measurement. Bring in record next appt. Follow up in 1 month 30 min medicare wellness no labs prior

## 2012-07-03 NOTE — Progress Notes (Signed)
Subjective:    Patient ID: [name] [name], female    DOB: 05/20/1946, 66 y.o.   MRN: [id number]  HPI She was scheduled for medicare wellness today.. But she has several isues that need to be covered today instead.  Elevated Cholesterol:  Due for re-eval. Not on any med.. Stopped statin due to SE. Lab Results  Component Value Date   CHOL 196 11/14/2010   HDL 46.90 11/14/2010   LDLCALC 122* 11/14/2010   LDLDIRECT 126.0 08/20/2010   TRIG 136.0 11/14/2010   CHOLHDL 4 11/14/2010   Diet compliance: poor. Exercise: none Other complaints:  Diabetes:  Due for re-eval. Lantus pens 80 units twice a day. Only occ using due to cost and anxiety about amount.. She is interested in try fastoing acting insulin before meals. Lab Results  Component Value Date   HGBA1C 10.7* 03/04/2011  Using medications without difficulties: Hypoglycemic episodes:? Hyperglycemic episodes: yes occ 300-400 Feet problems: Blood Sugars averaging:300s eye exam within last year:  Low back pain x 2 months worse in last 6 months.. Runs down left leg. Better with meloxicam  But cannot take due to causing tinnitus.  Worse with standing and walking.  No weakness.  No causal fall.  Tramadol did not help. Muscle relaxant helps some. Never had x-ray.  No history of back problems.    Review of Systems  Constitutional: Negative for fever and fatigue.  HENT: Negative for ear pain.   Eyes: Negative for pain.  Respiratory: Negative for chest tightness and shortness of breath.   Cardiovascular: Negative for chest pain, palpitations and leg swelling.  Gastrointestinal: Negative for abdominal pain.  Genitourinary: Negative for dysuria.       Objective:   Physical Exam  Constitutional: Vital signs are normal. She appears well-developed and well-nourished. She is cooperative.  Non-toxic appearance. She does not appear ill. No distress.  HENT:  Head: Normocephalic.  Right Ear: Hearing, tympanic membrane, external ear and ear  canal normal. Tympanic membrane is not erythematous, not retracted and not bulging.  Left Ear: Hearing, tympanic membrane, external ear and ear canal normal. Tympanic membrane is not erythematous, not retracted and not bulging.  Nose: No mucosal edema or rhinorrhea. Right sinus exhibits no maxillary sinus tenderness and no frontal sinus tenderness. Left sinus exhibits no maxillary sinus tenderness and no frontal sinus tenderness.  Mouth/Throat: Uvula is midline, oropharynx is clear and moist and mucous membranes are normal.  Eyes: Conjunctivae normal, EOM and lids are normal. Pupils are equal, round, and reactive to light. No foreign bodies found.  Neck: Trachea normal and normal range of motion. Neck supple. Carotid bruit is not present. No mass and no thyromegaly present.  Cardiovascular: Normal rate, regular rhythm, S1 normal, S2 normal, normal heart sounds, intact distal pulses and normal pulses.  Exam reveals no gallop and no friction rub.   No murmur heard. Pulmonary/Chest: Effort normal and breath sounds normal. Not tachypneic. No respiratory distress. She has no decreased breath sounds. She has no wheezes. She has no rhonchi. She has no rales.  Abdominal: Soft. Normal appearance and bowel sounds are normal. There is no tenderness.  Musculoskeletal:       Lumbar back: She exhibits decreased range of motion, tenderness and bony tenderness. She exhibits no swelling.       Neg faber's neg SLR.  Neurological: She is alert.  Skin: Skin is warm, dry and intact. No rash noted.  Psychiatric: Her speech is normal and behavior is normal. Judgment and thought content normal.  Her mood appears not anxious. Cognition and memory are normal. She does not exhibit a depressed mood.     Diabetic foot exam: Normal inspection No skin breakdown No calluses  Normal DP pulses Normal sensation to light touch and monofilament Nails normal      Assessment & Plan:

## 2012-07-04 LAB — VITAMIN D 25 HYDROXY (VIT D DEFICIENCY, FRACTURES): Vit D, 25-Hydroxy: 38 ng/mL (ref 30–89)

## 2012-07-07 ENCOUNTER — Telehealth: Payer: Self-pay | Admitting: Family Medicine

## 2012-07-07 DIAGNOSIS — M545 Low back pain, unspecified: Secondary | ICD-10-CM

## 2012-07-07 DIAGNOSIS — M25551 Pain in right hip: Secondary | ICD-10-CM

## 2012-07-07 NOTE — Telephone Encounter (Signed)
Message copied by Jinny Sanders on Tue Jul 07, 2012 12:02 AM ------      Message from: Cathlean Cower      Created: Mon Jul 06, 2012  8:11 AM       Patient says that if she takes the tramadol all the time it makes her ears ring. Patient wants to do the pt can you refer

## 2012-07-08 NOTE — Assessment & Plan Note (Signed)
She has had good compliance and control. The current nasal pillows mask seems to be uncomfortable for her. It sounds as if her old machine is beginning to have mechanical problems. She will discuss with her DME company.

## 2012-07-08 NOTE — Assessment & Plan Note (Signed)
Plan-sample Dymista nasal spray for trial. Flu vaccine

## 2012-07-09 DIAGNOSIS — M545 Low back pain: Secondary | ICD-10-CM | POA: Diagnosis not present

## 2012-07-09 DIAGNOSIS — M76899 Other specified enthesopathies of unspecified lower limb, excluding foot: Secondary | ICD-10-CM | POA: Diagnosis not present

## 2012-07-17 DIAGNOSIS — M76899 Other specified enthesopathies of unspecified lower limb, excluding foot: Secondary | ICD-10-CM | POA: Diagnosis not present

## 2012-07-17 DIAGNOSIS — M545 Low back pain: Secondary | ICD-10-CM | POA: Diagnosis not present

## 2012-07-28 DIAGNOSIS — M545 Low back pain: Secondary | ICD-10-CM | POA: Diagnosis not present

## 2012-07-28 DIAGNOSIS — M76899 Other specified enthesopathies of unspecified lower limb, excluding foot: Secondary | ICD-10-CM | POA: Diagnosis not present

## 2012-07-30 DIAGNOSIS — M76899 Other specified enthesopathies of unspecified lower limb, excluding foot: Secondary | ICD-10-CM | POA: Diagnosis not present

## 2012-07-30 DIAGNOSIS — M545 Low back pain: Secondary | ICD-10-CM | POA: Diagnosis not present

## 2012-08-04 DIAGNOSIS — M545 Low back pain: Secondary | ICD-10-CM | POA: Diagnosis not present

## 2012-08-04 DIAGNOSIS — M76899 Other specified enthesopathies of unspecified lower limb, excluding foot: Secondary | ICD-10-CM | POA: Diagnosis not present

## 2012-08-06 DIAGNOSIS — M545 Low back pain: Secondary | ICD-10-CM | POA: Diagnosis not present

## 2012-08-06 DIAGNOSIS — M76899 Other specified enthesopathies of unspecified lower limb, excluding foot: Secondary | ICD-10-CM | POA: Diagnosis not present

## 2012-08-11 DIAGNOSIS — M545 Low back pain: Secondary | ICD-10-CM | POA: Diagnosis not present

## 2012-08-11 DIAGNOSIS — M76899 Other specified enthesopathies of unspecified lower limb, excluding foot: Secondary | ICD-10-CM | POA: Diagnosis not present

## 2012-08-12 NOTE — Assessment & Plan Note (Signed)
Due for re-eval. 

## 2012-08-12 NOTE — Assessment & Plan Note (Signed)
Will evaluate with X-ray.

## 2012-08-12 NOTE — Assessment & Plan Note (Addendum)
Likely poor control on current regimen.  Continue Lantus  60 units twice daily. Add novolog 5 units before meals three times a day. Get back on track with diet and exercise and weight loss. Check blood sugar daily fasting and once a day 2 hours  after meal, record measurement. Bring in record next appt.

## 2012-08-13 DIAGNOSIS — M76899 Other specified enthesopathies of unspecified lower limb, excluding foot: Secondary | ICD-10-CM | POA: Diagnosis not present

## 2012-08-13 DIAGNOSIS — M545 Low back pain: Secondary | ICD-10-CM | POA: Diagnosis not present

## 2012-08-17 ENCOUNTER — Telehealth: Payer: Self-pay

## 2012-08-17 NOTE — Telephone Encounter (Signed)
Pt left note needs records sent to Dr Chalmers Cater for diabetic control and treatment. I called pt an advised needs to sign medical record release and specify what records pt wants sent to Dr Chalmers Cater. Pt voiced understanding.

## 2012-08-20 DIAGNOSIS — M76899 Other specified enthesopathies of unspecified lower limb, excluding foot: Secondary | ICD-10-CM | POA: Diagnosis not present

## 2012-08-20 DIAGNOSIS — M545 Low back pain: Secondary | ICD-10-CM | POA: Diagnosis not present

## 2012-09-30 ENCOUNTER — Encounter: Payer: Self-pay | Admitting: Family Medicine

## 2012-09-30 ENCOUNTER — Ambulatory Visit (INDEPENDENT_AMBULATORY_CARE_PROVIDER_SITE_OTHER)
Admission: RE | Admit: 2012-09-30 | Discharge: 2012-09-30 | Disposition: A | Discharge status: Home / Self Care | Payer: Medicare Other | Source: Ambulatory Visit | Attending: Family Medicine | Admitting: Family Medicine

## 2012-09-30 ENCOUNTER — Ambulatory Visit (INDEPENDENT_AMBULATORY_CARE_PROVIDER_SITE_OTHER): Payer: Medicare Other | Admitting: Family Medicine

## 2012-09-30 VITALS — BP 142/62 | HR 68 | Temp 98.2°F | Wt 219.0 lb

## 2012-09-30 DIAGNOSIS — S8990XA Unspecified injury of unspecified lower leg, initial encounter: Secondary | ICD-10-CM | POA: Diagnosis not present

## 2012-09-30 DIAGNOSIS — M79674 Pain in right toe(s): Secondary | ICD-10-CM

## 2012-09-30 DIAGNOSIS — M79609 Pain in unspecified limb: Secondary | ICD-10-CM | POA: Diagnosis not present

## 2012-09-30 DIAGNOSIS — S99919A Unspecified injury of unspecified ankle, initial encounter: Secondary | ICD-10-CM | POA: Diagnosis not present

## 2012-09-30 DIAGNOSIS — S99929A Unspecified injury of unspecified foot, initial encounter: Secondary | ICD-10-CM | POA: Diagnosis not present

## 2012-09-30 DIAGNOSIS — M7989 Other specified soft tissue disorders: Secondary | ICD-10-CM | POA: Diagnosis not present

## 2012-10-01 NOTE — Progress Notes (Signed)
Therapist, music at [address] [facility] [place] [address] Phone: [phone] Fax: [phone]  Date:  09/30/2012   Name:  [name] [name]   DOB:  [birth year]   MRN:  [id number] Gender: female Age: 67 y.o.  Primary Physician:  Eliezer Lofts, MD  Evaluating MD: Owens Loffler, MD   Chief Complaint: Injured right great toe, dropped jar of peanut butter on it    History of Present Illness:  [name] [name] is a 67 y.o. pleasant patient who presents with the following:  Pleasant patient who is having pain in her RIGHT great toe after she dropped a jar thing about her on it. She is here today primarily to know if she has fractured her great toe.  Patient Active Problem List  Diagnosis  . TUBULOVILLOUS ADENOMA, COLON  . Poorly controlled type 2 diabetes mellitus  . HYPERCHOLESTEROLEMIA  . OBESITY  . TREMOR, ESSENTIAL  . POLYNEUROPATHY IN DIABETES  . TINNITUS, CHRONIC, BILATERAL  . Palpitations  . ALLERGIC RHINITIS  . LUNG NODULE  . GERD  . DIVERTICULOSIS, COLON  . IBS  . DISORDER, LIVER NEC  . CYST AND PSEUDOCYST OF PANCREAS  . Obstructive sleep apnea  . URINARY INCONTINENCE  . UNSPECIFIED VITAMIN D DEFICIENCY  . Urinary frequency  . Low back pain  . Right hip pain    Past Medical History  Diagnosis Date  . Acute cystitis   . Acute pharyngitis   . Other diseases of lung, not elsewhere classified   . Cyst and pseudocyst of pancreas   . Polyneuropathy in diabetes(357.2)   . Pain in joint, lower leg   . Type II or unspecified type diabetes mellitus with neurological manifestations, not stated as uncontrolled(250.60)   . Edema   . Dysuria   . Unspecified disorder of liver   . Periapical abscess without sinus   . Depressive disorder, not elsewhere classified   . Benign neoplasm of colon   . Unspecified sleep apnea   . Obesity, unspecified   . Pure hypercholesterolemia   . Unspecified urinary incontinence   . Other specified cardiac dysrhythmias     . Irritable bowel syndrome   . Diverticulosis of colon (without mention of hemorrhage)   . Dysthymic disorder   . Essential and other specified forms of tremor   . Esophageal reflux   . Allergic rhinitis, cause unspecified   . Type II or unspecified type diabetes mellitus without mention of complication, not stated as uncontrolled   . Abnormal abdominal CT scan 04/2006    slight abn liver  . Abnormal CT scan, pancreas or bile duct 04/2006    cyst  . Abnormal chest CT 10/07    unable to bx  . Sleep apnea     Past Surgical History  Procedure Laterality Date  . Rotator cuff repair  2005  . Kidney stone surgery  1980's    removal  . Partial hysterectomy  1990's  . 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 Cholecystectomy      15-20 years ago    History   Social History  . Marital Status: Married    Spouse Name: N/A    Number of Children: 95  . Years of Education: N/A   Occupational History  .     Social History Main Topics  . Smoking status: Never Smoker   . Smokeless tobacco: Not on file  . Alcohol Use: Not on file  . Drug Use:  Not on file  . Sexually Active: Not on file   Other Topics Concern  . Not on file   Social History Narrative   Husband treated for testicular cancer, 2 cups coffee a day, no exercise    Family History  Problem Relation Age of Onset  . Cirrhosis Father   . Diabetes      2 aunts  . Clotting disorder Mother   . Cancer      grandmother  . Allergies      "whole family"  . Asthma      "whole family"    Allergies  Allergen Reactions  . Epinephrine Hcl     REACTION: Had tremors when it was given.  . Erythromycin Ethylsuccinate     REACTION: abd spasms  . Iohexol      Code: HIVES, Desc: pt needs premedicated-- hives on prev contrast study per md office     Medication list has been reviewed and updated.  Outpatient Prescriptions Prior to Visit  Medication Sig Dispense Refill  . aspirin 81 MG tablet Take 81 mg by mouth daily.         . B-D ULTRAFINE III SHORT PEN 31G X 8 MM MISC USE AS DIRECTED  100 each  12  . betamethasone dipropionate (DIPROLENE) 0.05 % cream APPLY TO AFFECTED AREA TWICE A DAY AS DIRECTED  15 g  1  . Calcium Carbonate-Vitamin D (CALCIUM + D PO) Take 1,200 mg by mouth 2 (two) times daily.       . fluticasone (FLONASE) 50 MCG/ACT nasal spray 2 sprays by Nasal route daily.        . furosemide (LASIX) 20 MG tablet TAKE 1 TABLET EVERY DAY *Needs appointment for any additional refills*  90 tablet  0  . glucose blood (ONE TOUCH TEST STRIPS) test strip 1 each by Other route as needed. Use as instructed       . insulin aspart (NOVOLOG FLEXPEN) 100 UNIT/ML injection Inject 5 Units into the skin 3 (three) times daily before meals.  1 pen  12  . insulin glargine (LANTUS SOLOSTAR) 100 UNIT/ML injection Inject 80 Units into the skin 2 (two) times daily.  45 mL  5  . Magnesium 500 MG CAPS Take 1 capsule by mouth daily.      . metoprolol (LOPRESSOR) 50 MG tablet Take 1 tablet (50 mg total) by mouth 2 (two) times daily.  180 tablet  3  . Multiple Vitamins-Minerals (ZINC PO) Take 1 tablet by mouth 2 (two) times daily.      Marland Kitchen omeprazole (PRILOSEC OTC) 20 MG tablet Take 20 mg by mouth daily.      . temazepam (RESTORIL) 15 MG capsule Take 1-2 capsules (15-30 mg total) by mouth at bedtime as needed.  30 capsule  4  . traMADol (ULTRAM) 50 MG tablet Take 1 tablet by mouth as needed.      . Azelastine-Fluticasone (DYMISTA) 137-50 MCG/ACT SUSP Place 2 sprays into both nostrils at bedtime.  1 Bottle  0  . cefdinir (OMNICEF) 300 MG capsule Take 1 capsule (300 mg total) by mouth 2 (two) times daily.  14 capsule  0   No facility-administered medications prior to visit.    Review of Systems:   GEN: No fevers, chills. Nontoxic. Primarily MSK c/o today. MSK: Detailed in the HPI GI: tolerating PO intake without difficulty Neuro: No numbness, parasthesias, or tingling associated. Otherwise the pertinent positives of the ROS are  noted above.    Physical  Examination: BP 142/62  Pulse 68  Temp(Src) 98.2 F (36.8 C)  Wt 219 lb (99.338 kg)  BMI 36.44 kg/m2  Ideal Body Weight:     GEN: WDWN, NAD, Non-toxic, Alert & Oriented x 3 HEENT: Atraumatic, Normocephalic.  Ears and Nose: No external deformity. EXTR: No clubbing/cyanosis/edema NEURO: Normal gait.  PSYCH: Normally interactive. Conversant. Not depressed or anxious appearing.  Calm demeanor.   Great toe is mildly tender to palpation. He does have some significant bruising. There is no tenderness throughout all other phalanges and nontender throughout the other aspects of the midfoot, forefoot and hindfoot. Nontender at the ankle.  Dg Toe Great Right  09/30/2012  *RADIOLOGY REPORT*  Clinical Data: Trauma to the right great toe.  RIGHT GREAT TOE  Comparison: None.  Findings: Moderate first MTP joint osteoarthritis.  There is no fracture identified.  No radiopaque foreign body.  There does appear be some dorsal soft tissue swelling over the great toe.  No convincing evidence of marginal erosions to suggest gout arthropathy.  IMPRESSION: Moderate first MTP joint osteoarthritis with soft tissue swelling. Mild bunion.   Original Report Authenticated By: Dereck Ligas, M.D.     Assessment and Plan: Pain of great toe, right - Plan: DG Toe Great Right   Consistent with soft tissue contusion and bone contusion. Likely will hurt for the next 2-3 weeks. Tylenol or Motrin as needed.  Signed, Maud Deed. Copland, MD 10/01/2012 10:17 AM

## 2012-10-23 ENCOUNTER — Encounter: Payer: Self-pay | Admitting: Internal Medicine

## 2012-10-28 ENCOUNTER — Other Ambulatory Visit: Payer: Self-pay | Admitting: Family Medicine

## 2012-11-11 DIAGNOSIS — E7800 Pure hypercholesterolemia, unspecified: Secondary | ICD-10-CM | POA: Diagnosis not present

## 2012-11-11 DIAGNOSIS — I10 Essential (primary) hypertension: Secondary | ICD-10-CM | POA: Diagnosis not present

## 2012-12-21 ENCOUNTER — Telehealth: Payer: Self-pay | Admitting: Internal Medicine

## 2012-12-21 ENCOUNTER — Encounter: Payer: Self-pay | Admitting: Physician Assistant

## 2012-12-21 ENCOUNTER — Ambulatory Visit (INDEPENDENT_AMBULATORY_CARE_PROVIDER_SITE_OTHER): Payer: Medicare Other | Admitting: Physician Assistant

## 2012-12-21 VITALS — BP 118/62 | HR 88 | Ht 65.0 in | Wt 216.1 lb

## 2012-12-21 DIAGNOSIS — K5900 Constipation, unspecified: Secondary | ICD-10-CM | POA: Diagnosis not present

## 2012-12-21 DIAGNOSIS — K5732 Diverticulitis of large intestine without perforation or abscess without bleeding: Secondary | ICD-10-CM | POA: Diagnosis not present

## 2012-12-21 DIAGNOSIS — Z800 Family history of malignant neoplasm of digestive organs: Secondary | ICD-10-CM | POA: Diagnosis not present

## 2012-12-21 DIAGNOSIS — Z86010 Personal history of colonic polyps: Secondary | ICD-10-CM | POA: Diagnosis not present

## 2012-12-21 MED ORDER — AMOXICILLIN-POT CLAVULANATE 875-125 MG PO TABS: 1.0000 | ORAL_TABLET | Freq: Two times a day (BID) | ORAL | Status: DC

## 2012-12-21 MED ORDER — MOVIPREP 100 G PO SOLR: 1.0000 | Freq: Once | ORAL | Status: DC

## 2012-12-21 MED ORDER — TRAMADOL HCL 50 MG PO TABS: 50.0000 mg | ORAL_TABLET | Freq: Four times a day (QID) | ORAL | Status: DC | PRN

## 2012-12-21 NOTE — Progress Notes (Signed)
Subjective:    Patient ID: [name] [name], female    DOB: [birth year], 67 y.o.   MRN: [id number]  HPI  [name] [name] is a pleasant 67 year old white female known to Dr. Delfin Edis. She was last seen in 2008 when she had colonoscopy. She was found to have moderately severe diverticular disease in the left colon and 2 colon polyps were also removed. Path on these showed one tubular adenoma and one hyperplastic polyp. She was scheduled for 5 year interval followup but had not come back for colonoscopy. She relates family history of colon cancer and polyps on her father's side of the family and says that her father had "cancerous polyps". Patient has had several episodes of diverticulitis over the years and says the last episode was about one year ago and was treated by her primary care provider. She says she typically gets diverticulitis in the spring or summer because she eats  a lot of vegetables, strawberries etc. with seeds from her garden. She says her current symptoms started about 2 weeks ago with lower abdominal discomfort and constipation. She started taking MiraLax and says that that has been somewhat helpful however she continues over the past week to have lower abdominal  discomfort which is particularly bothersome at night. She says when she lays down she hurts in her lower back and in her lower abdomen and get some pain radiating down the front of both legs as well. Has not noted any melena or hematochezia. Her appetite has been fine, she's not sure she feels any worse after eating. She's not had any fever or chills has had some vague nausea . She had some old Flagyl left at home and had taken this for a couple of days last week says that made her nauseated    Review of Systems  Constitutional: Positive for fatigue.  HENT: Negative.   Eyes: Negative.   Respiratory: Negative.   Cardiovascular: Negative.   Gastrointestinal: Positive for nausea, abdominal pain and constipation.  Endocrine:  Negative.   Genitourinary: Negative.   Musculoskeletal: Positive for back pain.  Allergic/Immunologic: Negative.   Neurological: Negative.   Hematological: Negative.   Psychiatric/Behavioral: Negative.    Outpatient Prescriptions Prior to Visit  Medication Sig Dispense Refill  . aspirin 81 MG tablet Take 81 mg by mouth daily.        . B-D ULTRAFINE III SHORT PEN 31G X 8 MM MISC USE AS DIRECTED  100 each  12  . betamethasone dipropionate (DIPROLENE) 0.05 % cream APPLY TO AFFECTED AREA TWICE A DAY AS DIRECTED  15 g  1  . Calcium Carbonate-Vitamin D (CALCIUM + D PO) Take 1,200 mg by mouth 2 (two) times daily.       . fluticasone (FLONASE) 50 MCG/ACT nasal spray USE 2 SPRAYS IN EACH NOSTRIL EVERY DAY  16 g  1  . furosemide (LASIX) 20 MG tablet TAKE 1 TABLET EVERY DAY *Needs appointment for any additional refills*  90 tablet  0  . glucose blood (ONE TOUCH TEST STRIPS) test strip 1 each by Other route as needed. Use as instructed       . insulin aspart (NOVOLOG FLEXPEN) 100 UNIT/ML injection Inject 5 Units into the skin 3 (three) times daily before meals.  1 pen  12  . Magnesium 500 MG CAPS Take 1 capsule by mouth daily.      . metoprolol (LOPRESSOR) 50 MG tablet Take 1 tablet (50 mg total) by mouth 2 (two) times daily.  180 tablet  3  . Multiple Vitamins-Minerals (ZINC PO) Take 1 tablet by mouth 2 (two) times daily.      Marland Kitchen NEXIUM 40 MG capsule TAKE 1 CAPSULE BY MOUTH DAILY  90 capsule  1  . omeprazole (PRILOSEC OTC) 20 MG tablet Take 20 mg by mouth daily.      . temazepam (RESTORIL) 15 MG capsule Take 1-2 capsules (15-30 mg total) by mouth at bedtime as needed.  30 capsule  4  . traMADol (ULTRAM) 50 MG tablet Take 1 tablet by mouth as needed.      . insulin glargine (LANTUS SOLOSTAR) 100 UNIT/ML injection Inject 80 Units into the skin 2 (two) times daily.  45 mL  5   No facility-administered medications prior to visit.   Allergies  Allergen Reactions  . Epinephrine Hcl     REACTION: Had  tremors when it was given.  . Erythromycin Ethylsuccinate     REACTION: abd spasms  . Iohexol      Code: HIVES, Desc: pt needs premedicated-- hives on prev contrast study per md office    Patient Active Problem List   Diagnosis Date Noted  . Urinary frequency 03/07/2011  . Low back pain 03/07/2011  . Right hip pain 03/07/2011  . UNSPECIFIED VITAMIN D DEFICIENCY 08/20/2010  . 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 LUNG NODULE 08/03/2007  . CYST AND PSEUDOCYST OF PANCREAS 08/03/2007  . 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  . DISORDER, LIVER NEC 02/22/2007  . 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 HYPERCHOLESTEROLEMIA 12/30/2006  . OBESITY 12/30/2006  . TREMOR, ESSENTIAL 12/30/2006  . Palpitations 12/30/2006  . ALLERGIC RHINITIS 12/30/2006  . GERD 12/30/2006  . DIVERTICULOSIS, COLON 12/30/2006  . IBS 12/30/2006  . Obstructive sleep apnea 12/30/2006  . URINARY INCONTINENCE 12/30/2006  . Poorly controlled type 2 diabetes mellitus 12/22/2006   History  Substance Use Topics  . Smoking status: Never Smoker   . Smokeless tobacco: Never Used  . Alcohol Use: No   family history includes Allergies in an unspecified family member; Asthma in an unspecified family member; Cancer in an unspecified family member; Cirrhosis in her father; Clotting disorder in her mother; and Diabetes in an unspecified family member. cancerous colon polyps -father      Objective:   Physical Exam  Developed older white female in no acute distress, pleasant blood pressure 118/62 pulse 88 height 5 foot 5 weight 216. HEENT; nontraumatic normocephalic EOMI PERRLA sclera anicteric,Neck; Supple no JVD, Cardiovascular; regular rate and rhythm with S1-S2 no murmur or gallop, Pulmonary; clear bilaterally, Abdomen; large soft she is tender in the left mid quadrant left lower quadrant and mildly in the right lower quadrant there is no guarding or rebound no palpable mass or hepatosplenomegaly bowel sounds are  present, Rectal; exam not done, Extremities; no clubbing cyanosis or edema skin warm and dry, Psych; mood and affect normal and appropriate       Assessment & Plan:  #5  67 year old female with probable recurrent diverticulitis. This patient says very typical for her to have back pain along with lower abdominal  pain when she has diverticulitis. #2 chronic constipation #3 personal history of adenomatous colon polyps overdue for followup colonoscopy #4 family history of colon cancer and polyps #5 insulin-dependent diabetes mellitus with polyneuropathy  Plan; discussion regarding potential triggers for diverticulitis-patient had been eating a lot of nuts recently and this may have been the culprit, she also tends towards constipation. Start MiraLax 17 g daily in 8 ounces of fluid Start Augmentin  875 by mouth twice daily x14 days to be taken with food Ultram 50 mg every 6 hours as needed for pain #30 no refill Will schedule for followup colonoscopy with Dr. Carlus Pavlov was discussed in detail with the patient and she is agreeable to proceed. We will intentionally schedule this 4-6 weeks out to allow her adequate time to resolve  her current episode of diverticulitis. Patient is instructed to call back in 3 days if her symptoms are not improved or of any point her pain worsens.

## 2012-12-21 NOTE — Progress Notes (Signed)
Reviewed and agree with antibiotics and colonoscopy after the attack resolves.

## 2012-12-21 NOTE — Telephone Encounter (Signed)
Patient reports back pain that radiates down her legs and some abdominal pain.  No fever, + for constipation.  She feels this is her diverticulitis.  She will come in and see Amy Esterwood PA today at 3:00

## 2012-12-21 NOTE — Patient Instructions (Addendum)
We sent prescriptions to [facility]. 1. Augmentin 2. Ultram Use Miralax daily, 17 grams. You have been scheduled for a colonoscopy with propofol. Please follow written instructions given to you at your visit today.  Please pick up your prep kit at the pharmacy within the next 1-3 days. If you use inhalers (even only as needed), please bring them with you on the day of your procedure. Your physician has requested that you go to www.startemmi.com and enter the access code given to you at your visit today. This web site gives a general overview about your procedure. However, you should still follow specific instructions given to you by our office regarding your preparation for the procedure.

## 2012-12-23 DIAGNOSIS — E7800 Pure hypercholesterolemia, unspecified: Secondary | ICD-10-CM | POA: Diagnosis not present

## 2012-12-23 DIAGNOSIS — I10 Essential (primary) hypertension: Secondary | ICD-10-CM | POA: Diagnosis not present

## 2012-12-29 ENCOUNTER — Ambulatory Visit: Payer: Medicare Other | Admitting: Internal Medicine

## 2013-01-20 ENCOUNTER — Telehealth: Payer: Self-pay | Admitting: Internal Medicine

## 2013-01-20 DIAGNOSIS — G4733 Obstructive sleep apnea (adult) (pediatric): Secondary | ICD-10-CM

## 2013-01-20 NOTE — Telephone Encounter (Signed)
ATC pt at # provided above.  Line rang several times with no answer and no option to leave msg.  WCB

## 2013-01-20 NOTE — Telephone Encounter (Signed)
Error.[name] [name] ° °

## 2013-01-21 NOTE — Telephone Encounter (Signed)
[name] from [facility] [phone]) called regarding CPAP & change to Medicare.  Asks that CY discuss w/ pt OSA, her using & benefits of the pap, what is currently wrong w/ pap & current order, including settings, etc.  [name] [name]

## 2013-01-21 NOTE — Telephone Encounter (Signed)
Order- [facility] - replacement for old CPAP machine, current pressure, mask of choice, humidifier, supplies, dx OSA

## 2013-01-21 NOTE — Telephone Encounter (Signed)
ATC pt x1 - line rang for approx 1 min with no answer and no option to LM.  WCB. Order has been sent to [facility]

## 2013-01-21 NOTE — Telephone Encounter (Signed)
[email or web address] for [name] to clarify below msg as to what exactly is needed.

## 2013-01-21 NOTE — Telephone Encounter (Signed)
Spoke with patient, patient has had her CPAP 8-9 yrs Patient states [facility] informed her she could receive new CPAP Dr. Annamaria Boots please advise if this ok to send order, thank you!  Last OV: 06/30/12 Next OV: 02/18/13

## 2013-01-21 NOTE — Telephone Encounter (Signed)
Spoke with [name] with [facility].  Since pt was started on cpap origanlly, she has changed to Medicare.  Because of this, pt will need a n OV with note stating that pt has OSA and pt is using and benefiting from cpap.  Also, a new order will need to be sent with pt's pressure to [facility].    I called, spoke with pt . She already has a pending OV with [facility] on July 31 but doesn't want to wait this long to get new machine.  We have moved her appt up to July 11 at 2:30 pm with CD[facility].  I have cancelled the July 31 appt.  Pt aware.  Will sign off and route to Katie as FYI to ensure this gets taken care of during [place].

## 2013-01-25 ENCOUNTER — Other Ambulatory Visit: Payer: Self-pay | Admitting: *Deleted

## 2013-01-25 MED ORDER — METOPROLOL TARTRATE 50 MG PO TABS: 50.0000 mg | ORAL_TABLET | Freq: Two times a day (BID) | ORAL | Status: DC

## 2013-01-25 NOTE — Telephone Encounter (Signed)
NO REFILLS UNTIL APPOINTMENT Fax Received. Refill Completed. Toye Rouillard Chowoe (R.M.A)

## 2013-01-26 ENCOUNTER — Ambulatory Visit: Payer: Medicare Other | Admitting: Internal Medicine

## 2013-01-29 ENCOUNTER — Ambulatory Visit (INDEPENDENT_AMBULATORY_CARE_PROVIDER_SITE_OTHER): Payer: Medicare Other | Admitting: Internal Medicine

## 2013-01-29 ENCOUNTER — Other Ambulatory Visit: Payer: Self-pay | Admitting: Family Medicine

## 2013-01-29 ENCOUNTER — Encounter: Payer: Self-pay | Admitting: Internal Medicine

## 2013-01-29 VITALS — BP 124/62 | HR 63 | Ht 65.0 in | Wt 221.8 lb

## 2013-01-29 DIAGNOSIS — J302 Other seasonal allergic rhinitis: Secondary | ICD-10-CM

## 2013-01-29 DIAGNOSIS — G4733 Obstructive sleep apnea (adult) (pediatric): Secondary | ICD-10-CM

## 2013-01-29 DIAGNOSIS — J309 Allergic rhinitis, unspecified: Secondary | ICD-10-CM | POA: Diagnosis not present

## 2013-01-29 MED ORDER — FLUTICASONE PROPIONATE 50 MCG/ACT NA SUSP: NASAL | Status: DC

## 2013-01-29 NOTE — Progress Notes (Signed)
01/24/12- 80 yoF never smoker last here in the way of 2000  was being followed for obstructive sleep apnea and a right lower lobe nodule. She comes now to reestablish. She needs replacement CPAP machine. PCP Dr Diona Browner NPSG 07/31/03- AHI 42/ hr, CPAP then titrated to 9. Weight was 200 lbs. She had been using CPAP 6-8/[facility] with good compliance and control. Evaluation in 2010 had indicated stable lung nodules in RLL. She now reports a recent chest cold which has left her with cough productive of thick white phlegm. She thinks it will clear up on its own. Bedtime 11:50 PM, short sleep latency, waking 3 times during the night before up between 6 and 7 AM. CT chest w/o cm 01/21/11-we reviewed the images and discussed the right lower lobe nodular density interpreted as mucous plug. IMPRESSION:  1. Subpleural scarring in the right lower lobe, adjacent to  prominent osteophytes.  2. Probable chronic mucoid impaction in the medial right lower  lobe, unchanged from 05/15/2006.  3. 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  Original Report Authenticated By: Luretha Rued, M.D.   06/30/12- 24 yoF never smoker followed for OSA, hx RLL nodule/ mucus plug FOLLOWS FOR: still needs new machine-[facility] telling her that her machine is not broken(unfixable). CPAP 6-8/[facility]. Since she got her new mask she has wakened sometimes in a panic. Nasal pillows don't fit right. Gets frontal headache lying down with CPAP on. Phlegm in her throat in the morning. The machine is more than 67 years old and options are not working reliably.  01/29/13- 66 yoF never smoker followed for OSA, hx RLL nodule/ mucus plug FOLLOWS FOR: ov for new CPAP machine thru [facility].  Reports is much better since last ov with new CPAP mask.  wearing CPAP every night for x6-8 hours CPAP ?7/ [facility] Combination of Astelin and Flonase nasal sprays overdries.   ROS-see HPI Constitutional:   No-   weight loss,  night sweats, fevers, chills, fatigue, lassitude. HEENT:   +headaches, no-difficulty swallowing, tooth/dental problems, sore throat,       No-  sneezing, itching, ear ache, nasal congestion, +post nasal drip,  CV:  No-   chest pain, orthopnea, PND, swelling in lower extremities, anasarca, dizziness, palpitations Resp: No- shortness of breath with exertion or at rest.              No- productive cough,  No non-productive cough,  No- coughing up of blood.              No- change in color of mucus.  No- wheezing.   Skin: No- rash or lesions. GI:  No- heartburn, indigestion, abdominal pain, nausea, vomiting,  GU:  MS:  +  joint pain or swelling.   Neuro-     nothing unusual Psych:  No- change in mood or affect. No depression or anxiety.  No memory loss.  OBJ- Physical Exam General- Alert, Oriented, Affect-appropriate, Distress- none acute, overweight Skin- rash-none, lesions- none, excoriation- none Lymphadenopathy- none Head- atraumatic            Eyes- Gross vision intact, PERRLA, conjunctivae and secretions clear            Ears- Hearing, canals-normal            Nose- + turbinate edema, no-Septal dev, mucus, polyps, erosion, perforation             Throat- Mallampati III , mucosa clear , drainage- none, tonsils-  atrophic Neck- flexible , trachea midline, no stridor , thyroid nl, carotid no bruit Chest - symmetrical excursion , unlabored           Heart/CV- RRR , no murmur , no gallop  , no rub, nl s1 s2                           - JVD- none , edema- none, stasis changes- none, varices- none           Lung- clear to P&A, wheeze- none, cough- none , dullness-none, rub- none           Chest wall-  Abd-  Br/ Gen/ Rectal- Not done, not indicated Extrem- cyanosis- none, clubbing, none, atrophy- none, strength- nl Neuro- grossly intact to observation

## 2013-01-29 NOTE — Patient Instructions (Addendum)
Order- [facility]  Replacement for worn out CPAP machine, autotitrate 5-15 x 7 days for pressure recommendation, mask of choice, humidifier, supplies              Dx OSA  Script for Flonase/ fluticasone sent

## 2013-02-10 DIAGNOSIS — M722 Plantar fascial fibromatosis: Secondary | ICD-10-CM | POA: Diagnosis not present

## 2013-02-12 ENCOUNTER — Other Ambulatory Visit: Payer: Self-pay | Admitting: Family Medicine

## 2013-02-14 ENCOUNTER — Encounter: Payer: Self-pay | Admitting: Internal Medicine

## 2013-02-14 NOTE — Assessment & Plan Note (Signed)
Plan- discussed antihistamines versus steroid nasal sprays, saline spray and options

## 2013-02-14 NOTE — Assessment & Plan Note (Signed)
Doing very well with CPAP but we need to define pressure and agreed to do an auto titration. Discussed replacement of old machine.

## 2013-02-17 ENCOUNTER — Encounter: Payer: Medicare Other | Admitting: Internal Medicine

## 2013-02-18 ENCOUNTER — Ambulatory Visit: Payer: Medicare Other | Admitting: Internal Medicine

## 2013-03-10 ENCOUNTER — Telehealth: Payer: Self-pay | Admitting: Internal Medicine

## 2013-03-10 DIAGNOSIS — G4733 Obstructive sleep apnea (adult) (pediatric): Secondary | ICD-10-CM

## 2013-03-10 NOTE — Telephone Encounter (Signed)
I spoke with pt. She stated she has been on CPAP auto for about 1 month. She stated she will sleep for about 1 hr wake up and get out of bed but will out it back on. She stated she is barely getting 4 hrs a night. She is using the humidifier and makes sure it has water. She is waking up with her mouth very dry. She is also feeling tired during the day. Since CDY is off this week will forward to doc of the day for recs. Please advise [place] thanks

## 2013-03-10 NOTE — Telephone Encounter (Signed)
Let her know that she can turn the heat up on the humidifier in order to get more moisture. Also need to get a download off her machine asap for the last 4 weeks and sent to [place] for review.

## 2013-03-10 NOTE — Telephone Encounter (Signed)
I spoke with pt and she is aware of recs. Order placed for download.

## 2013-03-30 ENCOUNTER — Encounter: Payer: Self-pay | Admitting: Cardiovascular Disease

## 2013-04-03 DIAGNOSIS — M5137 Other intervertebral disc degeneration, lumbosacral region: Secondary | ICD-10-CM | POA: Diagnosis not present

## 2013-04-05 ENCOUNTER — Ambulatory Visit: Payer: Medicare Other | Admitting: Cardiovascular Disease

## 2013-04-14 ENCOUNTER — Encounter: Payer: Medicare Other | Admitting: Internal Medicine

## 2013-04-14 ENCOUNTER — Telehealth: Payer: Self-pay | Admitting: Internal Medicine

## 2013-04-14 NOTE — Telephone Encounter (Signed)
This is a Dr. Olevia Perches patient, I will copy her

## 2013-04-19 DIAGNOSIS — M545 Low back pain: Secondary | ICD-10-CM | POA: Diagnosis not present

## 2013-04-19 DIAGNOSIS — S335XXA Sprain of ligaments of lumbar spine, initial encounter: Secondary | ICD-10-CM | POA: Diagnosis not present

## 2013-04-19 DIAGNOSIS — M48061 Spinal stenosis, lumbar region without neurogenic claudication: Secondary | ICD-10-CM | POA: Diagnosis not present

## 2013-04-19 DIAGNOSIS — M5020 Other cervical disc displacement, unspecified cervical region: Secondary | ICD-10-CM | POA: Diagnosis not present

## 2013-04-23 DIAGNOSIS — M545 Low back pain: Secondary | ICD-10-CM | POA: Diagnosis not present

## 2013-04-24 DIAGNOSIS — M545 Low back pain: Secondary | ICD-10-CM | POA: Diagnosis not present

## 2013-04-27 DIAGNOSIS — M545 Low back pain: Secondary | ICD-10-CM | POA: Diagnosis not present

## 2013-04-29 DIAGNOSIS — M545 Low back pain: Secondary | ICD-10-CM | POA: Diagnosis not present

## 2013-04-30 DIAGNOSIS — M48061 Spinal stenosis, lumbar region without neurogenic claudication: Secondary | ICD-10-CM | POA: Diagnosis not present

## 2013-04-30 DIAGNOSIS — M5126 Other intervertebral disc displacement, lumbar region: Secondary | ICD-10-CM | POA: Diagnosis not present

## 2013-05-06 DIAGNOSIS — M48061 Spinal stenosis, lumbar region without neurogenic claudication: Secondary | ICD-10-CM | POA: Diagnosis not present

## 2013-05-10 ENCOUNTER — Other Ambulatory Visit: Payer: Self-pay | Admitting: Cardiovascular Disease

## 2013-05-13 DIAGNOSIS — E7800 Pure hypercholesterolemia, unspecified: Secondary | ICD-10-CM | POA: Diagnosis not present

## 2013-05-13 DIAGNOSIS — Z23 Encounter for immunization: Secondary | ICD-10-CM | POA: Diagnosis not present

## 2013-05-13 DIAGNOSIS — I10 Essential (primary) hypertension: Secondary | ICD-10-CM | POA: Diagnosis not present

## 2013-05-19 DIAGNOSIS — M48061 Spinal stenosis, lumbar region without neurogenic claudication: Secondary | ICD-10-CM | POA: Diagnosis not present

## 2013-08-10 ENCOUNTER — Other Ambulatory Visit: Payer: Self-pay | Admitting: Cardiovascular Disease

## 2013-08-10 ENCOUNTER — Other Ambulatory Visit: Payer: Self-pay | Admitting: Family Medicine

## 2013-08-10 MED ORDER — TEMAZEPAM 15 MG PO CAPS: 15.0000 mg | ORAL_CAPSULE | Freq: Every evening | ORAL | Status: DC | PRN

## 2013-08-10 NOTE — Telephone Encounter (Signed)
Pt called and pt sees Dr Chalmers Cater for diabetes; pt request refill on temazepam until can schedule CPX appt.

## 2013-08-10 NOTE — Telephone Encounter (Signed)
Very over due for DM follow up... No refills until appt set up with fasting labs prior.

## 2013-08-10 NOTE — Addendum Note (Signed)
Addended byEliezer Lofts E on: 08/10/2013 01:09 PM   Modules accepted: Orders

## 2013-08-10 NOTE — Telephone Encounter (Signed)
Called to [facility].

## 2013-08-10 NOTE — Telephone Encounter (Signed)
Last office visit 09/30/2012 with Dr. Lorelei Pont.  Ok to refill?

## 2013-08-13 DIAGNOSIS — J00 Acute nasopharyngitis [common cold]: Secondary | ICD-10-CM | POA: Diagnosis not present

## 2013-08-17 ENCOUNTER — Encounter: Payer: Self-pay | Admitting: Internal Medicine

## 2013-08-17 ENCOUNTER — Ambulatory Visit (INDEPENDENT_AMBULATORY_CARE_PROVIDER_SITE_OTHER): Payer: Medicare Other | Admitting: Internal Medicine

## 2013-08-17 VITALS — BP 118/70 | HR 64 | Temp 98.6°F | Wt 222.5 lb

## 2013-08-17 DIAGNOSIS — J029 Acute pharyngitis, unspecified: Secondary | ICD-10-CM

## 2013-08-17 DIAGNOSIS — J209 Acute bronchitis, unspecified: Secondary | ICD-10-CM

## 2013-08-17 MED ORDER — AMOXICILLIN 875 MG PO TABS: 875.0000 mg | ORAL_TABLET | Freq: Two times a day (BID) | ORAL | Status: DC

## 2013-08-17 NOTE — Progress Notes (Signed)
HPI  Pt presents to the clinic today with c/o cough, sore throat and ear pain. She reports this started about 1 week ago. She does c/o difficulty swallowing. The cough is productive. She is coughing up thick brown mucous. [facility] advised her to take OTC Mucinex and Delsym. She does feel like her symptoms are getting worse. She has no history of allergies or breathing problems. She has had sick contacts.  Review of Systems      Past Medical History  Diagnosis Date  . Acute cystitis   . Acute pharyngitis   . Other diseases of lung, not elsewhere classified   . Cyst and pseudocyst of pancreas   . Polyneuropathy in diabetes(357.2)   . Pain in joint, lower leg   . Type II or unspecified type diabetes mellitus with neurological manifestations, not stated as uncontrolled   . Edema   . Dysuria   . Unspecified disorder of liver   . Periapical abscess without sinus   . Depressive disorder, not elsewhere classified   . Benign neoplasm of colon   . Unspecified sleep apnea   . Obesity, unspecified   . Pure hypercholesterolemia   . Unspecified urinary incontinence   . Other specified cardiac dysrhythmias(427.89)   . Irritable bowel syndrome   . Diverticulosis of colon (without mention of hemorrhage)   . Dysthymic disorder   . Essential and other specified forms of tremor   . Esophageal reflux   . Allergic rhinitis, cause unspecified   . Type II or unspecified type diabetes mellitus without mention of complication, not stated as uncontrolled   . Abnormal abdominal CT scan 04/2006    slight abn liver  . Abnormal CT scan, pancreas or bile duct 04/2006    cyst  . Abnormal chest CT 10/07    unable to bx  . Sleep apnea   . Diverticulitis     Family History  Problem Relation Age of Onset  . Cirrhosis Father   . Diabetes      2 aunts  . Clotting disorder Mother   . Cancer      grandmother  . Allergies      "whole family"  . Asthma      "whole family"    History    Social History  . Marital Status: Married    Spouse Name: N/A    Number of Children: 84  . Years of Education: N/A   Occupational History  .     Social History Main Topics  . Smoking status: Never Smoker   . Smokeless tobacco: Never Used  . Alcohol Use: No  . Drug Use: No  . Sexual Activity: Not on file   Other Topics Concern  . Not on file   Social History Narrative   Husband treated for testicular cancer, 2 cups coffee a day, no exercise    Allergies  Allergen Reactions  . Epinephrine Hcl     REACTION: Had tremors when it was given.  . Erythromycin Ethylsuccinate     REACTION: abd spasms  . Iohexol      Code: HIVES, Desc: pt needs premedicated-- hives on prev contrast study per md office      Constitutional: Positive headache, fatigue and fever. Denies abrupt weight changes.  HEENT:  Positive ear fullness, sore throat. Denies eye redness, eye pain, pressure behind the eyes, facial pain, nasal congestion, ear pain, ringing in the ears, wax buildup, runny nose or bloody nose. Respiratory: Positive cough. Denies difficulty  breathing or shortness of breath.  Cardiovascular: Denies chest pain, chest tightness, palpitations or swelling in the hands or feet.   No other specific complaints in a complete review of systems (except as listed in HPI above).  Objective:   BP 118/70  Pulse 64  Temp(Src) 98.6 F (37 C) (Oral)  Wt 222 lb 8 oz (100.925 kg)  SpO2 98% Wt Readings from Last 3 Encounters:  08/17/13 222 lb 8 oz (100.925 kg)  01/29/13 221 lb 12.8 oz (100.608 kg)  12/21/12 216 lb 2 oz (98.034 kg)     General: Appears her stated age, well developed, well nourished in NAD. HEENT: Head: normal shape and size; Eyes: sclera white, no icterus, conjunctiva pink, PERRLA and EOMs intact; Ears: Tm's gray and intact, dull light reflex, + effusions bilaterally; Nose: mucosa pink and moist, septum midline; Throat/Mouth: + PND. Teeth present, mucosa erythematous and moist,  no exudate noted, no lesions or ulcerations noted.  Neck: Mild cervical lymphadenopathy. Neck supple, trachea midline. No massses, lumps or thyromegaly present.  Cardiovascular: Normal rate and rhythm. S1,S2 noted.  No murmur, rubs or gallops noted. No JVD or BLE edema. No carotid bruits noted. Pulmonary/Chest: Normal effort and scattered rhonchi throughout. No respiratory distress. No wheezes, rales noted.      Assessment & Plan:   Acute Pharyngitis with Acute Bronchitis:  Get some rest and drink plenty of water Do salt water gargles for the sore throat Ok to use throat lozenges or chloraseptic spray eRx for amoxicillin bid x 10 days  RTC as needed or if symptoms persist.

## 2013-08-17 NOTE — Patient Instructions (Signed)
Pharyngitis °Pharyngitis is redness, pain, and swelling (inflammation) of your pharynx.  °CAUSES  °Pharyngitis is usually caused by infection. Most of the time, these infections are from viruses (viral) and are part of a cold. However, sometimes pharyngitis is caused by bacteria (bacterial). Pharyngitis can also be caused by allergies. Viral pharyngitis may be spread from person to person by coughing, sneezing, and personal items or utensils (cups, forks, spoons, toothbrushes). Bacterial pharyngitis may be spread from person to person by more intimate contact, such as kissing.  °SIGNS AND SYMPTOMS  °Symptoms of pharyngitis include:   °· Sore throat.   °· Tiredness (fatigue).   °· Low-grade fever.   °· Headache. °· Joint pain and muscle aches. °· Skin rashes. °· Swollen lymph nodes. °· Plaque-like film on throat or tonsils (often seen with bacterial pharyngitis). °DIAGNOSIS  °Your health care provider will ask you questions about your illness and your symptoms. Your medical history, along with a physical exam, is often all that is needed to diagnose pharyngitis. Sometimes, a rapid strep test is done. Other lab tests may also be done, depending on the suspected cause.  °TREATMENT  °Viral pharyngitis will usually get better in 3 4 days without the use of medicine. Bacterial pharyngitis is treated with medicines that kill germs (antibiotics).  °HOME CARE INSTRUCTIONS  °· Drink enough water and fluids to keep your urine clear or pale yellow.   °· Only take over-the-counter or prescription medicines as directed by your health care provider:   °· If you are prescribed antibiotics, make sure you finish them even if you start to feel better.   °· Do not take aspirin.   °· Get lots of rest.   °· Gargle with 8 oz of salt water (½ tsp of salt per 1 qt of water) as often as every 1 2 hours to soothe your throat.   °· Throat lozenges (if you are not at risk for choking) or sprays may be used to soothe your throat. °SEEK MEDICAL  CARE IF:  °· You have large, tender lumps in your neck. °· You have a rash. °· You cough up green, yellow-brown, or bloody spit. °SEEK IMMEDIATE MEDICAL CARE IF:  °· Your neck becomes stiff. °· You drool or are unable to swallow liquids. °· You vomit or are unable to keep medicines or liquids down. °· You have severe pain that does not go away with the use of recommended medicines. °· You have trouble breathing (not caused by a stuffy nose). °MAKE SURE YOU:  °· Understand these instructions. °· Will watch your condition. °· Will get help right away if you are not doing well or get worse. °Document Released: 07/08/2005 Document Revised: 04/28/2013 Document Reviewed: 03/15/2013 °[employer]® Patient Information ©2014 [employer]. ° °

## 2013-08-17 NOTE — Progress Notes (Signed)
HPI: Pt presents to the office with complaints of chest congestion, sore throat, and cough. She states symptoms started one week ago. [facility] and was told to take mucinex and delsym, which helped some, but she is continuing to feel poorly. She endorses ear pain/fullness, chest discomfort, productive cough, and fatigue. She has been around sick contacts.   Past Medical History  Diagnosis Date  . Acute cystitis   . Acute pharyngitis   . Other diseases of lung, not elsewhere classified   . Cyst and pseudocyst of pancreas   . Polyneuropathy in diabetes(357.2)   . Pain in joint, lower leg   . Type II or unspecified type diabetes mellitus with neurological manifestations, not stated as uncontrolled   . Edema   . Dysuria   . Unspecified disorder of liver   . Periapical abscess without sinus   . Depressive disorder, not elsewhere classified   . Benign neoplasm of colon   . Unspecified sleep apnea   . Obesity, unspecified   . Pure hypercholesterolemia   . Unspecified urinary incontinence   . Other specified cardiac dysrhythmias(427.89)   . Irritable bowel syndrome   . Diverticulosis of colon (without mention of hemorrhage)   . Dysthymic disorder   . Essential and other specified forms of tremor   . Esophageal reflux   . Allergic rhinitis, cause unspecified   . Type II or unspecified type diabetes mellitus without mention of complication, not stated as uncontrolled   . Abnormal abdominal CT scan 04/2006    slight abn liver  . Abnormal CT scan, pancreas or bile duct 04/2006    cyst  . Abnormal chest CT 10/07    unable to bx  . Sleep apnea   . Diverticulitis     Current Outpatient Prescriptions  Medication Sig Dispense Refill  . aspirin 81 MG tablet Take 81 mg by mouth daily.        . B-D ULTRAFINE III SHORT PEN 31G X 8 MM MISC USE AS DIRECTED  100 each  12  . betamethasone dipropionate (DIPROLENE) 0.05 % cream APPLY TO AFFECTED AREA TWICE A DAY AS  DIRECTED  15 g  1  . Calcium Carbonate-Vitamin D (CALCIUM + D PO) Take 1,200 mg by mouth 2 (two) times daily.       . fluticasone (FLONASE) 50 MCG/ACT nasal spray 1-2 puffs each nostril, once or twice daily for allergy  16 g  prn  . furosemide (LASIX) 20 MG tablet TAKE 1 TABLET EVERY DAY *NEEDS APPOINTMENT FOR ANY ADDITIONAL REFILLS*  90 tablet  0  . Magnesium 500 MG CAPS Take 1 capsule by mouth daily.      . metFORMIN (GLUCOPHAGE-XR) 500 MG 24 hr tablet Take 1 tablet by mouth daily.      . metoprolol (LOPRESSOR) 50 MG tablet TAKE 1 TABLET BY MOUTH TWICE A DAY  180 tablet  0  . MOVIPREP 100 G SOLR Take 1 kit (100 g total) by mouth once. "Pharmacist please use BIN: [id number] GROUP: [id number] ID: [id number] Call [phone] for pharmacy questions "Pt will save $10"  1 kit  0  . NEXIUM 40 MG capsule TAKE 1 CAPSULE BY MOUTH DAILY  90 capsule  1  . NOVOLIN 70/30 RELION (70-30) 100 UNIT/ML injection Inject 70 Units into the skin 2 (two) times daily.      Marland Kitchen omeprazole (PRILOSEC OTC) 20 MG tablet Take 20 mg by mouth daily as needed.       Marland Kitchen  ONE TOUCH ULTRA TEST test strip USE 2 TIMES A DAY  100 each  3  . temazepam (RESTORIL) 15 MG capsule Take 1-2 capsules (15-30 mg total) by mouth at bedtime as needed.  30 capsule  0  . traMADol (ULTRAM) 50 MG tablet Take 1 tablet (50 mg total) by mouth every 6 (six) hours as needed for pain.  30 tablet  0  . amoxicillin (AMOXIL) 875 MG tablet Take 1 tablet (875 mg total) by mouth 2 (two) times daily.  20 tablet  0   No current facility-administered medications for this visit.    Allergies  Allergen Reactions  . Epinephrine Hcl     REACTION: Had tremors when it was given.  . Erythromycin Ethylsuccinate     REACTION: abd spasms  . Iohexol      Code: HIVES, Desc: pt needs premedicated-- hives on prev contrast study per md office     Family History  Problem Relation Age of Onset  . Cirrhosis Father   . Diabetes      2 aunts  . Clotting disorder Mother    . Cancer      grandmother  . Allergies      "whole family"  . Asthma      "whole family"    History   Social History  . Marital Status: Married    Spouse Name: N/A    Number of Children: 68  . Years of Education: N/A   Occupational History  .     Social History Main Topics  . Smoking status: Never Smoker   . Smokeless tobacco: Never Used  . Alcohol Use: No  . Drug Use: No  . Sexual Activity: Not on file   Other Topics Concern  . Not on file   Social History Narrative   Husband treated for testicular cancer, 2 cups coffee a day, no exercise    ROS:  Constitutional:Endorses fatigue,  Fever, and headache. Denies abrupt weight changes.  HEENT:Endorses nasal congestion and sore throat.  Denies eye pain, eye redness, ear pain, ringing in the ears. Respiratory:Endorses cough and sputum production. Chest heaviness.  Denies difficulty breathing or shortness of breath      No other specific complaints in a complete review of systems (except as listed in HPI above).  PE:  BP 118/70  Pulse 64  Temp(Src) 98.6 F (37 C) (Oral)  Wt 222 lb 8 oz (100.925 kg)  SpO2 98% Wt Readings from Last 3 Encounters:  08/17/13 222 lb 8 oz (100.925 kg)  01/29/13 221 lb 12.8 oz (100.608 kg)  12/21/12 216 lb 2 oz (98.034 kg)    General: Appears their stated age, well developed, well nourished in NAD. HEENT: Head: normal shape and size; Eyes: sclera white, no icterus, conjunctiva pink, PERRLA and EOMs intact; Ears: Tm's gray and intact, normal light reflex; Nose: mucosa pink and moist, septum midline; Throat/Mouth: Teeth present, mucosa pink and moist, red/swollen. No lesions or ulcerations noted.  Neck: Normal range of motion. Neck supple, trachea midline. Mild cervical lymphadenopathy. No massses, lumps or thyromegaly present.  Cardiovascular: Normal rate and rhythm. S1,S2 noted.  No murmur, rubs or gallops noted. No JVD or BLE edema. No carotid bruits noted. Pulmonary/Chest: Normal  effort and positive vesicular breath sounds. No respiratory distress. No wheezes, rales or ronchi noted.  Psychiatric: Mood and affect normal. Behavior is normal. Judgment and thought content normal.      Assessment and Plan: Acute Pharyngitis: Prescribed Amoxicillin 835m PO BID for  10 days Encouraged supportive care, rest fluids OTC cough suppressants, throat lozenges Follow up in 3-5 days if no symptom improvement  Labib Cwynar S, Student-NP

## 2013-08-17 NOTE — Progress Notes (Signed)
Pre-visit discussion using our clinic review tool. No additional management support is needed unless otherwise documented below in the visit note.  

## 2013-08-25 ENCOUNTER — Telehealth: Payer: Self-pay

## 2013-08-25 MED ORDER — AZITHROMYCIN 250 MG PO TABS: ORAL_TABLET | ORAL | Status: DC

## 2013-08-25 NOTE — Telephone Encounter (Signed)
Stop amox. Change to zpack.

## 2013-08-25 NOTE — Telephone Encounter (Signed)
Pt left v/m ; pt seen 08/17/13 and had to stop amoxicillin due to severe GERD; pt said bronchitis is worse and pt wants to know if needs a different antibiotic [facility] or does pt need to be rechecked.Please advise.

## 2013-08-25 NOTE — Telephone Encounter (Signed)
Left message on voicemail to stop Amoxicillin and change to Z-pack.  Prescription has been sent in to [facility].

## 2013-08-31 ENCOUNTER — Ambulatory Visit (INDEPENDENT_AMBULATORY_CARE_PROVIDER_SITE_OTHER)
Admission: RE | Admit: 2013-08-31 | Discharge: 2013-08-31 | Disposition: A | Discharge status: Home / Self Care | Payer: Medicare Other | Source: Ambulatory Visit | Attending: Family Medicine | Admitting: Family Medicine

## 2013-08-31 ENCOUNTER — Ambulatory Visit (INDEPENDENT_AMBULATORY_CARE_PROVIDER_SITE_OTHER): Payer: Medicare Other | Admitting: Family Medicine

## 2013-08-31 ENCOUNTER — Encounter: Payer: Self-pay | Admitting: Family Medicine

## 2013-08-31 VITALS — BP 100/70 | HR 82 | Temp 97.9°F | Ht 65.0 in | Wt 222.2 lb

## 2013-08-31 DIAGNOSIS — E1165 Type 2 diabetes mellitus with hyperglycemia: Secondary | ICD-10-CM

## 2013-08-31 DIAGNOSIS — R059 Cough, unspecified: Secondary | ICD-10-CM | POA: Diagnosis not present

## 2013-08-31 DIAGNOSIS — R0602 Shortness of breath: Secondary | ICD-10-CM

## 2013-08-31 DIAGNOSIS — R05 Cough: Secondary | ICD-10-CM | POA: Diagnosis not present

## 2013-08-31 DIAGNOSIS — E119 Type 2 diabetes mellitus without complications: Secondary | ICD-10-CM

## 2013-08-31 MED ORDER — DOXYCYCLINE HYCLATE 100 MG PO TABS: 100.0000 mg | ORAL_TABLET | Freq: Two times a day (BID) | ORAL | Status: DC

## 2013-08-31 MED ORDER — FUROSEMIDE 20 MG PO TABS: ORAL_TABLET | ORAL | Status: DC

## 2013-08-31 MED ORDER — METOPROLOL TARTRATE 50 MG PO TABS: ORAL_TABLET | ORAL | Status: DC

## 2013-08-31 MED ORDER — GUAIFENESIN-CODEINE 100-10 MG/5ML PO SYRP: 5.0000 mL | ORAL_SOLUTION | Freq: Every evening | ORAL | Status: DC | PRN

## 2013-08-31 NOTE — Assessment & Plan Note (Signed)
Eval with CXR. If negative cover for atypical PNA with doxycycline.

## 2013-08-31 NOTE — Addendum Note (Signed)
Addended by: Carter Kitten on: 08/31/2013 03:47 PM   Modules accepted: Orders

## 2013-08-31 NOTE — Progress Notes (Signed)
Pre-visit discussion using our clinic review tool. No additional management support is needed unless otherwise documented below in the visit note.  

## 2013-08-31 NOTE — Patient Instructions (Signed)
Schedule medicare wellness with fasting labs prior. We will call you with the CXR results. Mucinex DM for cough, can use cough suppressant at night.

## 2013-08-31 NOTE — Progress Notes (Signed)
   Subjective:    Patient ID: [name] [name], female    DOB: [birth year], 68 y.o.   MRN: [id number]  Cough Associated symptoms include shortness of breath. Pertinent negatives include no ear pain.  Shortness of Breath Pertinent negatives include no ear pain.    68 year old female with history of poorly controlled DM, OSA, allergic rhinitis presents for returned nasal congestion, bloody nasal discharge. B face pressure.  Chest congestion, cough, light yellow sputum.  Short of breath,worse today, no wheezing. No fever.  Overall has had these symptoms initially starting 4 weeks ago, now returned in last 3 days.   She was seen on 1/29 and treated for sinus infection with Zpack. He symptoms resolved but came back after completed.  Husband with PNA 2 weeks ago. Review of Systems  Constitutional: Positive for fatigue.  HENT: Negative for ear pain.   Eyes: Negative for pain.  Respiratory: Positive for cough and shortness of breath.        Objective:   Physical Exam  Constitutional: Vital signs are normal. She appears well-developed and well-nourished. She is cooperative.  Non-toxic appearance. She does not appear ill. No distress.  HENT:  Head: Normocephalic.  Right Ear: Hearing, tympanic membrane, external ear and ear canal normal. Tympanic membrane is not erythematous, not retracted and not bulging.  Left Ear: Hearing, tympanic membrane, external ear and ear canal normal. Tympanic membrane is not erythematous, not retracted and not bulging.  Nose: Mucosal edema and rhinorrhea present. Right sinus exhibits no maxillary sinus tenderness and no frontal sinus tenderness. Left sinus exhibits no maxillary sinus tenderness and no frontal sinus tenderness.  Mouth/Throat: Uvula is midline, oropharynx is clear and moist and mucous membranes are normal.  Eyes: Conjunctivae, EOM and lids are normal. Pupils are equal, round, and reactive to light. Lids are everted and swept, no foreign bodies found.    Neck: Trachea normal and normal range of motion. Neck supple. Carotid bruit is not present. No mass and no thyromegaly present.  Cardiovascular: Normal rate, regular rhythm, S1 normal, S2 normal, normal heart sounds, intact distal pulses and normal pulses.  Exam reveals no gallop and no friction rub.   No murmur heard. Pulmonary/Chest: Effort normal and breath sounds normal. Not tachypneic. No respiratory distress. She has no decreased breath sounds. She has no wheezes. She has no rhonchi. She has no rales.  Neurological: She is alert.  Skin: Skin is warm, dry and intact. No rash noted.  Psychiatric: Her speech is normal and behavior is normal. Judgment normal. Her mood appears not anxious. Cognition and memory are normal. She does not exhibit a depressed mood.          Assessment & Plan:

## 2013-09-07 ENCOUNTER — Other Ambulatory Visit: Payer: Self-pay | Admitting: Family Medicine

## 2013-09-07 NOTE — Telephone Encounter (Signed)
Last office visit 08/31/2013.  Ok to refill?

## 2013-09-08 NOTE — Telephone Encounter (Signed)
Called to [facility].

## 2013-09-15 DIAGNOSIS — IMO0001 Reserved for inherently not codable concepts without codable children: Secondary | ICD-10-CM | POA: Diagnosis not present

## 2013-09-15 DIAGNOSIS — I10 Essential (primary) hypertension: Secondary | ICD-10-CM | POA: Diagnosis not present

## 2013-09-15 DIAGNOSIS — R7401 Elevation of levels of liver transaminase levels: Secondary | ICD-10-CM | POA: Diagnosis not present

## 2013-09-15 DIAGNOSIS — R7402 Elevation of levels of lactic acid dehydrogenase (LDH): Secondary | ICD-10-CM | POA: Diagnosis not present

## 2013-09-15 DIAGNOSIS — E7800 Pure hypercholesterolemia, unspecified: Secondary | ICD-10-CM | POA: Diagnosis not present

## 2014-01-06 ENCOUNTER — Ambulatory Visit (INDEPENDENT_AMBULATORY_CARE_PROVIDER_SITE_OTHER): Payer: Medicare Other | Admitting: Family Medicine

## 2014-01-06 ENCOUNTER — Encounter: Payer: Self-pay | Admitting: Family Medicine

## 2014-01-06 VITALS — BP 140/80 | HR 58 | Temp 98.5°F | Wt 221.0 lb

## 2014-01-06 DIAGNOSIS — F5104 Psychophysiologic insomnia: Secondary | ICD-10-CM

## 2014-01-06 DIAGNOSIS — R300 Dysuria: Secondary | ICD-10-CM | POA: Diagnosis not present

## 2014-01-06 DIAGNOSIS — N390 Urinary tract infection, site not specified: Secondary | ICD-10-CM | POA: Diagnosis not present

## 2014-01-06 DIAGNOSIS — G4700 Insomnia, unspecified: Secondary | ICD-10-CM | POA: Diagnosis not present

## 2014-01-06 DIAGNOSIS — E119 Type 2 diabetes mellitus without complications: Secondary | ICD-10-CM

## 2014-01-06 DIAGNOSIS — E1165 Type 2 diabetes mellitus with hyperglycemia: Secondary | ICD-10-CM

## 2014-01-06 LAB — POCT URINALYSIS DIPSTICK
Bilirubin, UA: NEGATIVE
Glucose, UA: NEGATIVE
Ketones, UA: NEGATIVE
Leukocytes, UA: NEGATIVE
Nitrite, UA: NEGATIVE
PH UA: 6.5
PROTEIN UA: NEGATIVE
RBC UA: NEGATIVE
SPEC GRAV UA: 1.015
Urobilinogen, UA: NEGATIVE

## 2014-01-06 LAB — HEMOGLOBIN A1C: HEMOGLOBIN A1C: 9.9 % — AB (ref 4.6–6.5)

## 2014-01-06 MED ORDER — NITROFURANTOIN MACROCRYSTAL 100 MG PO CAPS: ORAL_CAPSULE | ORAL | Status: DC

## 2014-01-06 MED ORDER — TRAZODONE HCL 50 MG PO TABS: 25.0000 mg | ORAL_TABLET | Freq: Every evening | ORAL | Status: DC | PRN

## 2014-01-06 NOTE — Assessment & Plan Note (Signed)
Treat with 7 day course of macrodantin .

## 2014-01-06 NOTE — Assessment & Plan Note (Signed)
Restart prophylaxis with nitrofunantoin 100 mg daily.

## 2014-01-06 NOTE — Assessment & Plan Note (Signed)
Trial of trazodone. If she decides to restart temezepam, I recommended she get it from [facility].

## 2014-01-06 NOTE — Patient Instructions (Addendum)
Trial of trazodone for sleep.  Stop at lab on way out.  Start  Nitrofurantoin twice daily x 7 days then change to daily for prophylaxsis

## 2014-01-06 NOTE — Assessment & Plan Note (Signed)
Due for a1c

## 2014-01-06 NOTE — Progress Notes (Signed)
   Subjective:    Patient ID: [name] [name], female    DOB: 05-Mar-1946, 68 y.o.   MRN: [id number]  HPI  68 year old female with history of recurrent kidney infections. She was on nitrofurantoin 100 mg at bedtime in past for prevention.    She as been having urinary frequency, some incontinence, burning with urination . All symptoms off and on. She has been taking soda water to treat.  She has taken 2 dose of nitrofurantoin. Now symptoms lsightly better.  She has had several UTI in last year, last one 3 months ago.  DM, poor control. She has been seeing Dr. Chalmers Cater lately. Last A1C 3 months ago. A1C was 11. She is taking 70/30 70 Units twice a day, she is also supposed to 60 Units at night. She has need for steroid injection in back, but cannot do until glucose  Is improved. Lab Results  Component Value Date   HGBA1C 11.2* 07/03/2012    She uses CPAP for sleep apnea. She uses temezepam for insomnia in past, she does not want to take this nightly given risk in this family of medication. She is looking for different options. Dr. Annamaria Boots usually prescribes this for her.       Review of Systems  Constitutional: Negative for fever and fatigue.  HENT: Negative for ear pain.   Eyes: Negative for pain.  Respiratory: Negative for chest tightness and shortness of breath.   Cardiovascular: Negative for chest pain, palpitations and leg swelling.  Gastrointestinal: Negative for abdominal pain.  Genitourinary: Negative for dysuria.       Objective:   Physical Exam  Constitutional: Vital signs are normal. She appears well-developed and well-nourished. She is cooperative.  Non-toxic appearance. She does not appear ill. No distress.  obese  HENT:  Head: Normocephalic.  Right Ear: Hearing, tympanic membrane, external ear and ear canal normal. Tympanic membrane is not erythematous, not retracted and not bulging.  Left Ear: Hearing, tympanic membrane, external ear and ear canal normal.  Tympanic membrane is not erythematous, not retracted and not bulging.  Nose: No mucosal edema or rhinorrhea. Right sinus exhibits no maxillary sinus tenderness and no frontal sinus tenderness. Left sinus exhibits no maxillary sinus tenderness and no frontal sinus tenderness.  Mouth/Throat: Uvula is midline, oropharynx is clear and moist and mucous membranes are normal.  Eyes: Conjunctivae, EOM and lids are normal. Pupils are equal, round, and reactive to light. Lids are everted and swept, no foreign bodies found.  Neck: Trachea normal and normal range of motion. Neck supple. Carotid bruit is not present. No mass and no thyromegaly present.  Cardiovascular: Normal rate, regular rhythm, S1 normal, S2 normal, normal heart sounds, intact distal pulses and normal pulses.  Exam reveals no gallop and no friction rub.   No murmur heard. Pulmonary/Chest: Effort normal and breath sounds normal. Not tachypneic. No respiratory distress. She has no decreased breath sounds. She has no wheezes. She has no rhonchi. She has no rales.  Abdominal: Soft. Normal appearance and bowel sounds are normal. There is no tenderness.  Neurological: She is alert.  Skin: Skin is warm, dry and intact. No rash noted.  Psychiatric: Her speech is normal and behavior is normal. Judgment and thought content normal. Her mood appears not anxious. Cognition and memory are normal. She does not exhibit a depressed mood.          Assessment & Plan:

## 2014-01-06 NOTE — Addendum Note (Signed)
Addended by: Ellamae Sia on: 01/06/2014 01:00 PM   Modules accepted: Orders

## 2014-01-07 ENCOUNTER — Telehealth: Payer: Self-pay

## 2014-01-07 ENCOUNTER — Ambulatory Visit (INDEPENDENT_AMBULATORY_CARE_PROVIDER_SITE_OTHER): Payer: Medicare Other

## 2014-01-07 DIAGNOSIS — Z23 Encounter for immunization: Secondary | ICD-10-CM

## 2014-01-07 NOTE — Telephone Encounter (Signed)
CAN said pt dropped a kitchen drawer this morning and a clean knife fell out of drawer and punctured foot between two toes; pt cleaned with alcohol,bleeding stopped. CAN thinks small puncture wound with no redness or swelling; some soreness noted. Last Td on immunization list was 06/24/2006. Advised CAN for pt to keep area clean, use neosporin and cover with bandaid; any sign of infection, redness,drainage, or increased soreness to contact office. If pt had Td 06/24/2006 pt does not need another tetanus booster; if pt is not sure if had tetanus on 06/24/2006 pt needs tetanus booster. Have no paper chart to confirm TD and pt is not sure if got tetanus. Pt will come today for nurse visit for TD per Dr Diona Browner.

## 2014-01-07 NOTE — Telephone Encounter (Signed)
Patient Information:  Caller Name: Adley  Phone: [phone]  Patient: [name] [name], [name] [name]  Gender: Female  DOB: 1946/04/21  Age: 68 Years  PCP: Eliezer Lofts (Family Practice)  Office Follow Up:  Does the office need to follow up with this patient?: No  Instructions For The Office: N/A  RN Note:  Called office and spoke to nurse Reana--she spoke with Dr Diona Browner and she advised to apply Neosporin and a bandaid to wound, call back if any sxs of infection. Also scheduled an appt to get tetanus shot @ 1130 today(01/07/14).  Symptoms  Reason For Call & Symptoms: Calling about puncture wound to foot in between toes from a clean fork or a knife that fell from a kitchen drawer. Bleeding stopped and wound was cleaned with alcohol. No redness or swelling and sli painful. Last tetanus was on 06/24/2006. Was just seen at the office yesterday.  Reviewed Health History In EMR: Yes  Reviewed Medications In EMR: Yes  Reviewed Allergies In EMR: Yes  Reviewed Surgeries / Procedures: Yes  Date of Onset of Symptoms: 01/07/2014  Guideline(s) Used:  Puncture Wound  Disposition Per Guideline:   See Today in Office  Reason For Disposition Reached:   No tetanus booster in > 5 years  Advice Given:  N/A  Patient Will Follow Care Advice:  YES  Appointment Scheduled:  01/07/2014 11:30:00 Appointment Scheduled Provider:  Eliezer Lofts (Family Practice)

## 2014-01-31 ENCOUNTER — Ambulatory Visit (INDEPENDENT_AMBULATORY_CARE_PROVIDER_SITE_OTHER): Payer: Medicare Other | Admitting: Internal Medicine

## 2014-01-31 ENCOUNTER — Encounter: Payer: Self-pay | Admitting: Internal Medicine

## 2014-01-31 VITALS — BP 130/76 | HR 77 | Ht 65.0 in | Wt 226.0 lb

## 2014-01-31 DIAGNOSIS — G4700 Insomnia, unspecified: Secondary | ICD-10-CM

## 2014-01-31 DIAGNOSIS — J309 Allergic rhinitis, unspecified: Secondary | ICD-10-CM

## 2014-01-31 DIAGNOSIS — J3089 Other allergic rhinitis: Secondary | ICD-10-CM

## 2014-01-31 DIAGNOSIS — F5104 Psychophysiologic insomnia: Secondary | ICD-10-CM

## 2014-01-31 DIAGNOSIS — G4733 Obstructive sleep apnea (adult) (pediatric): Secondary | ICD-10-CM | POA: Diagnosis not present

## 2014-01-31 DIAGNOSIS — J302 Other seasonal allergic rhinitis: Secondary | ICD-10-CM

## 2014-01-31 MED ORDER — TEMAZEPAM 15 MG PO CAPS: 15.0000 mg | ORAL_CAPSULE | Freq: Every evening | ORAL | Status: DC | PRN

## 2014-01-31 NOTE — Assessment & Plan Note (Signed)
Mild stuffiness but not aware of postnasal drainage

## 2014-01-31 NOTE — Assessment & Plan Note (Signed)
Mask discomfort is preventing adequate compliance now Plan-download for pressure and compliance, temazepam with discussion. Had failed trazodone

## 2014-01-31 NOTE — Progress Notes (Signed)
01/24/12- 63 yoF never smoker last here in the way of 2000  was being followed for obstructive sleep apnea and a right lower lobe nodule. She comes now to reestablish. She needs replacement CPAP machine. PCP Dr Diona Browner NPSG 07/31/03- AHI 42/ hr, CPAP then titrated to 9. Weight was 200 lbs. She had been using CPAP 6-8/[facility] with good compliance and control. Evaluation in 2010 had indicated stable lung nodules in RLL. She now reports a recent chest cold which has left her with cough productive of thick white phlegm. She thinks it will clear up on its own. Bedtime 11:50 PM, short sleep latency, waking 3 times during the night before up between 6 and 7 AM. CT chest w/o cm 01/21/11-we reviewed the images and discussed the right lower lobe nodular density interpreted as mucous plug. IMPRESSION:  1. Subpleural scarring in the right lower lobe, adjacent to  prominent osteophytes.  2. Probable chronic mucoid impaction in the medial right lower  lobe, unchanged from 05/15/2006.  3. 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  Original Report Authenticated By: Luretha Rued, M.D.   06/30/12- 59 yoF never smoker followed for OSA, hx RLL nodule/ mucus plug FOLLOWS FOR: still needs new machine-[facility] telling her that her machine is not broken(unfixable). CPAP 6-8/[facility]. Since she got her new mask she has wakened sometimes in a panic. Nasal pillows don't fit right. Gets frontal headache lying down with CPAP on. Phlegm in her throat in the morning. The machine is more than 68 years old and options are not working reliably.  01/29/13- 66 yoF never smoker followed for OSA, hx RLL nodule/ mucus plug FOLLOWS FOR: ov for new CPAP machine thru [facility].  Reports is much better since last ov with new CPAP mask.  wearing CPAP every night for x6-8 hours CPAP ?7/ [facility] Combination of Astelin and Flonase nasal sprays overdries.   01/31/14- 57 yoF never smoker followed for OSA, hx  RLL nodule/ mucus plug FOLLOWS FOR:  Wearing CPAP 1 hour per night due to waking up feeling like machine is pushing out too much pressure and very dry mouth when waking.  Getting no rest and hoarse She has not been sleeping as well since replacement machine CPAP/ 7/[facility] . Feels overdry. This may have partly begun when she had dental work sometime ago. Admits she listens out for her elderly husband is out of bed a lot at night. Admits reflux despite Nexium twice daily plus Zantac. Throat tickle. Would like to retry temazepam instead of trazodone which was tried by her primary physician.  ROS-see HPI Constitutional:   No-   weight loss, night sweats, fevers, chills, fatigue, lassitude. HEENT:   +headaches, no-difficulty swallowing, tooth/dental problems, sore throat,       No-  sneezing, itching, ear ache, nasal congestion, +post nasal drip,  CV:  No-   chest pain, orthopnea, PND, swelling in lower extremities, anasarca, dizziness, palpitations Resp: No- shortness of breath with exertion or at rest.              No- productive cough,  No non-productive cough,  No- coughing up of blood.              No- change in color of mucus.  No- wheezing.   Skin: No- rash or lesions. GI:   +heartburn, indigestion, no-abdominal pain, nausea, vomiting,  GU:  MS:  +  joint pain or swelling.   Neuro-     nothing  unusual Psych:  No- change in mood or affect. No depression or anxiety.  No memory loss.  OBJ- Physical Exam General- Alert, Oriented, Affect-appropriate, Distress- none acute, overweight Skin- rash-none, lesions- none, excoriation- none Lymphadenopathy- none Head- atraumatic            Eyes- Gross vision intact, PERRLA, conjunctivae and secretions clear            Ears- Hearing, canals-normal            Nose- + turbinate edema, no-Septal dev, mucus, polyps, erosion, perforation             Throat- Mallampati III , mucosa clear-not dry , drainage- none, tonsils- atrophic, own teeth Neck-  flexible , trachea midline, no stridor , thyroid nl, carotid no bruit Chest - symmetrical excursion , unlabored           Heart/CV- RRR , no murmur , no gallop  , no rub, nl s1 s2                           - JVD- none , edema- none, stasis changes- none, varices- none           Lung- clear to P&A, wheeze- none, cough- none , dullness-none, rub- none           Chest wall-  Abd-  Br/ Gen/ Rectal- Not done, not indicated Extrem- cyanosis- none, clubbing, none, atrophy- none, strength- nl Neuro- grossly intact to observation

## 2014-01-31 NOTE — Assessment & Plan Note (Signed)
She asks to go back to temazepam which worked better than trazodone and other products tried

## 2014-01-31 NOTE — Patient Instructions (Addendum)
Order- DME [facility]- autotitrate CPAP 5-15 x 7 days for compliance and pressure recommendation   Script printed for temazepam

## 2014-02-25 ENCOUNTER — Telehealth: Payer: Self-pay | Admitting: Family Medicine

## 2014-02-25 NOTE — Telephone Encounter (Signed)
Diabetic Bundle.  Pt needs LDL and A1C in October.  Left vm for pt to return call.

## 2014-02-25 NOTE — Telephone Encounter (Signed)
Pt returned your call. She stated she was going to see her Endocrinologist in [place].  Pt stated it was not necessary for this appointment

## 2014-03-10 ENCOUNTER — Other Ambulatory Visit: Payer: Self-pay | Admitting: Family Medicine

## 2014-04-05 ENCOUNTER — Ambulatory Visit: Payer: Medicare Other | Admitting: Internal Medicine

## 2014-04-06 ENCOUNTER — Encounter: Payer: Self-pay | Admitting: Internal Medicine

## 2014-04-14 DIAGNOSIS — E7800 Pure hypercholesterolemia, unspecified: Secondary | ICD-10-CM | POA: Diagnosis not present

## 2014-04-14 DIAGNOSIS — R7402 Elevation of levels of lactic acid dehydrogenase (LDH): Secondary | ICD-10-CM | POA: Diagnosis not present

## 2014-04-14 DIAGNOSIS — IMO0001 Reserved for inherently not codable concepts without codable children: Secondary | ICD-10-CM | POA: Diagnosis not present

## 2014-04-14 DIAGNOSIS — I10 Essential (primary) hypertension: Secondary | ICD-10-CM | POA: Diagnosis not present

## 2014-04-21 ENCOUNTER — Other Ambulatory Visit: Payer: Self-pay | Admitting: Family Medicine

## 2014-04-24 ENCOUNTER — Other Ambulatory Visit: Payer: Self-pay | Admitting: Family Medicine

## 2014-04-26 ENCOUNTER — Telehealth: Payer: Self-pay | Admitting: Family Medicine

## 2014-04-26 NOTE — Telephone Encounter (Signed)
Called pt to schedule cpx and labs per donna.   Pt stated her spouse was hospital and she would call back to schedule

## 2014-04-26 NOTE — Telephone Encounter (Signed)
Pt will call to schedule [employer] [facility] with fasting labs prior with in next 3 months.

## 2014-07-07 ENCOUNTER — Ambulatory Visit (INDEPENDENT_AMBULATORY_CARE_PROVIDER_SITE_OTHER): Payer: Medicare Other

## 2014-07-07 ENCOUNTER — Encounter: Payer: Self-pay | Admitting: Podiatry

## 2014-07-07 ENCOUNTER — Ambulatory Visit (INDEPENDENT_AMBULATORY_CARE_PROVIDER_SITE_OTHER): Payer: Medicare Other | Admitting: Podiatry

## 2014-07-07 VITALS — BP 130/60 | HR 63 | Resp 14 | Ht 65.0 in | Wt 225.0 lb

## 2014-07-07 DIAGNOSIS — M79673 Pain in unspecified foot: Secondary | ICD-10-CM | POA: Diagnosis not present

## 2014-07-07 DIAGNOSIS — M722 Plantar fascial fibromatosis: Secondary | ICD-10-CM | POA: Diagnosis not present

## 2014-07-07 NOTE — Progress Notes (Signed)
   Subjective:    Patient ID: [name] [name], female    DOB: 1945-10-01, 68 y.o.   MRN: [id number]  HPI Comments: Pt states she has had right heel pain with episode of catching in the anterior foot at least once in the last year.  Pt complains of similar symptoms in the left heel, and DR. Hyatt injected both heels about 1 year ago.  Pt also states had soreness to the plantar right 5th MPJ area with pressure, at the 1st of the week.     Review of Systems  All other systems reviewed and are negative.      Objective:   Physical Exam: I have reviewed her past medical history medications allergies surgery social history and review of systems. Currently she is complaining of pain to bilateral heels. Pulses are strongly palpable bilateral. She is pale patient pedal calcaneal tubercles bilateral.        Assessment & Plan:  Assessment: Plantar fasciitis bilateral.  Plan: Injected bilateral heels today with Kenalog and local anesthetic. We scanned her feet for a set of orthotics with a little more rigidity. We discussed appropriate shoe gear and the need to purchase shoes more frequently.

## 2014-08-04 ENCOUNTER — Ambulatory Visit (INDEPENDENT_AMBULATORY_CARE_PROVIDER_SITE_OTHER): Payer: Medicare Other | Admitting: Podiatry

## 2014-08-04 VITALS — BP 128/84 | HR 74 | Resp 17

## 2014-08-04 DIAGNOSIS — M722 Plantar fascial fibromatosis: Secondary | ICD-10-CM | POA: Diagnosis not present

## 2014-08-05 NOTE — Progress Notes (Signed)
She presents today for follow-up of plantar fasciitis bilateral. She was injected last time she was in and was scanned for set of orthotics. She states that there has not been a lot of improvement as of yet. She has taken all of her anti-inflammatory and continues to wear her plantar fascial braces and night splint.  Objective: Vital signs are stable she is alert and oriented 3. Pulses remain palpable and no calf pain. She has pain on palpation medial calcaneal tubercles bilateral.  Assessment: Intractable plantar fasciitis bilateral.  Plan: Reinjected the bilateral heels today plantar fascial strapping right. Dispensed orthotics with both oral and written home-going instructions. Follow-up with me in 4 weeks.

## 2014-08-16 DIAGNOSIS — F329 Major depressive disorder, single episode, unspecified: Secondary | ICD-10-CM | POA: Diagnosis not present

## 2014-08-16 DIAGNOSIS — E1165 Type 2 diabetes mellitus with hyperglycemia: Secondary | ICD-10-CM | POA: Diagnosis not present

## 2014-08-30 ENCOUNTER — Ambulatory Visit (INDEPENDENT_AMBULATORY_CARE_PROVIDER_SITE_OTHER): Payer: Medicare Other | Admitting: Family Medicine

## 2014-08-30 ENCOUNTER — Encounter: Payer: Self-pay | Admitting: Family Medicine

## 2014-08-30 VITALS — BP 112/60 | HR 69 | Temp 98.2°F | Wt 212.8 lb

## 2014-08-30 DIAGNOSIS — E1165 Type 2 diabetes mellitus with hyperglycemia: Secondary | ICD-10-CM

## 2014-08-30 DIAGNOSIS — T783XXA Angioneurotic edema, initial encounter: Secondary | ICD-10-CM | POA: Diagnosis not present

## 2014-08-30 DIAGNOSIS — E119 Type 2 diabetes mellitus without complications: Secondary | ICD-10-CM | POA: Diagnosis not present

## 2014-08-30 MED ORDER — PREDNISONE 20 MG PO TABS: ORAL_TABLET | ORAL | Status: DC

## 2014-08-30 NOTE — Progress Notes (Signed)
Pre visit review using our clinic review tool, if applicable. No additional management support is needed unless otherwise documented below in the visit note. 

## 2014-08-30 NOTE — Assessment & Plan Note (Signed)
Unclear cause. Hold new med, peanut butter and new detergent. Start antihistamine and prednisone taper.  Will not give IM steroid given no clear angioedema today and poorly controlled CBGs.

## 2014-08-30 NOTE — Progress Notes (Signed)
   Subjective:    Patient ID: [name] [name], female    DOB: 1946/05/19, 69 y.o.   MRN: [id number]  HPI  69 year old female with history of  Poorly controlled DM, polyneuropathy, IBS,  presents with  New onset ? Allergic reaction last night.  She reports in last week she has noted two episodes of ? Allergic reaction.  Each time she felt sugar was dropping.. Of feeling shaky, and then feeling throat closing.  Tried water.  Occurred again last night.Evelina Bucy two benadryl. Symptoms improved some until woke up few hours later with similar sensation of throat swelling, nasal passage burning. Some lip swelling and tounge swelling. No SOB but trouble moving air some, no wheeze.  No fever. No rash, occ slightly itchy   ? If associated with peanut butter. No other diet changes.  No new home exposures.  She recently started celexa 3 weeks ago. Not on any ACEI. She has had some cold symptoms and virus in last month off and on.  She has changed to oxyclean detergent lately.   Years ago.. Had allergy testing.. Was allergic to a lot of pollens.  DM followed by ENDO CBGs 200 FBS      Review of Systems  Constitutional: Negative for fever and fatigue.  HENT: Negative for ear pain.   Eyes: Negative for pain.  Respiratory: Negative for chest tightness and shortness of breath.   Cardiovascular: Negative for chest pain, palpitations and leg swelling.  Gastrointestinal: Negative for abdominal pain.  Genitourinary: Negative for dysuria.       Objective:   Physical Exam  Constitutional: Vital signs are normal. She appears well-developed and well-nourished. She is cooperative.  Non-toxic appearance. She does not appear ill. No distress.  HENT:  Head: Normocephalic.  Right Ear: Hearing, tympanic membrane, external ear and ear canal normal. Tympanic membrane is not erythematous, not retracted and not bulging.  Left Ear: Hearing, tympanic membrane, external ear and ear canal normal. Tympanic  membrane is not erythematous, not retracted and not bulging.  Nose: No mucosal edema or rhinorrhea. Right sinus exhibits no maxillary sinus tenderness and no frontal sinus tenderness. Left sinus exhibits no maxillary sinus tenderness and no frontal sinus tenderness.  Mouth/Throat: Uvula is midline, oropharynx is clear and moist and mucous membranes are normal.  Eyes: Conjunctivae, EOM and lids are normal. Pupils are equal, round, and reactive to light. Lids are everted and swept, no foreign bodies found.  Neck: Trachea normal and normal range of motion. Neck supple. Carotid bruit is not present. No thyroid mass and no thyromegaly present.  Cardiovascular: Normal rate, regular rhythm, S1 normal, S2 normal, normal heart sounds, intact distal pulses and normal pulses.  Exam reveals no gallop and no friction rub.   No murmur heard. Pulmonary/Chest: Effort normal and breath sounds normal. No tachypnea. No respiratory distress. She has no decreased breath sounds. She has no wheezes. She has no rhonchi. She has no rales.  Abdominal: Soft. Normal appearance and bowel sounds are normal. There is no tenderness.  Neurological: She is alert.  Skin: Skin is warm, dry and intact. No rash noted.  Psychiatric: Her speech is normal and behavior is normal. Judgment and thought content normal. Her mood appears not anxious. Cognition and memory are normal. She does not exhibit a depressed mood.          Assessment & Plan:

## 2014-08-30 NOTE — Patient Instructions (Addendum)
Hold celexa for now, but can consider a re-trial  In a few weeks given this has been helpful. Hold peanut butter for now. Go back to old detergent. Use zyrtec at night given benadryl keep you up at night. Use benadry if needed. Complete prednisone taper. If CBG increasing  You can increase insulin 70/30 by 2 units.  If CBGs >350.. Call endo.

## 2014-08-30 NOTE — Assessment & Plan Note (Signed)
If CBGs increase with prednisone taper.. Can titrate up 70/30 insulin by 2 untis per dose. If very high CBGs, call endocrinologist.

## 2014-09-01 ENCOUNTER — Ambulatory Visit (INDEPENDENT_AMBULATORY_CARE_PROVIDER_SITE_OTHER): Payer: Medicare Other | Admitting: Podiatry

## 2014-09-01 ENCOUNTER — Encounter: Payer: Self-pay | Admitting: Podiatry

## 2014-09-01 VITALS — BP 130/62 | HR 69 | Resp 16

## 2014-09-01 DIAGNOSIS — M76821 Posterior tibial tendinitis, right leg: Secondary | ICD-10-CM | POA: Diagnosis not present

## 2014-09-01 DIAGNOSIS — M722 Plantar fascial fibromatosis: Secondary | ICD-10-CM

## 2014-09-03 NOTE — Progress Notes (Signed)
She presents today for follow-up of bilateral plantar fasciitis. She states that she is doing much better as far as her left foot her right foot is really tender particularly and here she portion posterior tibial tendon area. States that she has recently been on prednisone for other problems from her primary care provider.  Objective: Vital signs are stable she is alert and oriented 3. There is no erythema edema cellulitis change or odor pain on palpation with fluctuance within the tendon sheath of the posterior tibial tendon. This is consistent with posterior tibial tendinitis or synovitis however I think that is more than likely associated with lateral compensatory syndrome.  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  Plan: Injected today the area with in the posterior tibial tendon with dexamethasone and local anesthetic. She will continue all conservative therapies follow up with me in 1 month.

## 2014-09-06 ENCOUNTER — Encounter: Payer: Self-pay | Admitting: Family Medicine

## 2014-09-06 ENCOUNTER — Telehealth: Payer: Self-pay | Admitting: *Deleted

## 2014-09-06 ENCOUNTER — Ambulatory Visit (INDEPENDENT_AMBULATORY_CARE_PROVIDER_SITE_OTHER): Payer: Medicare Other | Admitting: Family Medicine

## 2014-09-06 ENCOUNTER — Ambulatory Visit (INDEPENDENT_AMBULATORY_CARE_PROVIDER_SITE_OTHER)
Admission: RE | Admit: 2014-09-06 | Discharge: 2014-09-06 | Disposition: A | Discharge status: Home / Self Care | Payer: Medicare Other | Source: Ambulatory Visit | Attending: Family Medicine | Admitting: Family Medicine

## 2014-09-06 VITALS — BP 110/62 | HR 77 | Temp 98.8°F | Ht 65.0 in | Wt 211.5 lb

## 2014-09-06 DIAGNOSIS — M79641 Pain in right hand: Secondary | ICD-10-CM | POA: Diagnosis not present

## 2014-09-06 DIAGNOSIS — M19041 Primary osteoarthritis, right hand: Secondary | ICD-10-CM | POA: Diagnosis not present

## 2014-09-06 LAB — CBC WITH DIFFERENTIAL/PLATELET
BASOS ABS: 0 10*3/uL (ref 0.0–0.1)
Basophils Relative: 0.3 % (ref 0.0–3.0)
EOS PCT: 1.2 % (ref 0.0–5.0)
Eosinophils Absolute: 0.1 10*3/uL (ref 0.0–0.7)
HEMATOCRIT: 44.5 % (ref 36.0–46.0)
Hemoglobin: 14.8 g/dL (ref 12.0–15.0)
LYMPHS ABS: 3.8 10*3/uL (ref 0.7–4.0)
Lymphocytes Relative: 34.2 % (ref 12.0–46.0)
MCHC: 33.2 g/dL (ref 30.0–36.0)
MCV: 90.9 fl (ref 78.0–100.0)
Monocytes Absolute: 0.7 10*3/uL (ref 0.1–1.0)
Monocytes Relative: 6.4 % (ref 3.0–12.0)
Neutro Abs: 6.5 10*3/uL (ref 1.4–7.7)
Neutrophils Relative %: 57.9 % (ref 43.0–77.0)
PLATELETS: 184 10*3/uL (ref 150.0–400.0)
RBC: 4.89 Mil/uL (ref 3.87–5.11)
RDW: 14.3 % (ref 11.5–15.5)
WBC: 11.3 10*3/uL — ABNORMAL HIGH (ref 4.0–10.5)

## 2014-09-06 LAB — URIC ACID: Uric Acid, Serum: 4.8 mg/dL (ref 2.4–7.0)

## 2014-09-06 MED ORDER — DOXYCYCLINE HYCLATE 100 MG PO CAPS: 100.0000 mg | ORAL_CAPSULE | Freq: Two times a day (BID) | ORAL | Status: DC

## 2014-09-06 NOTE — Assessment & Plan Note (Signed)
No known injury but has been trying to keep weight off feet using wheelchair, right handed. MAy have injured MSK.? Bone density.. At risk for fracture... Send for  X-ray.  No clear sign of entry of infection... Check CBC Will also check uric acid given thumb pain.

## 2014-09-06 NOTE — Patient Instructions (Addendum)
Stop at X-ray and lab on way out.  Stop prednisone.  Stop pushing on wheelchair with right palm.  Ice, elevated hand.

## 2014-09-06 NOTE — Progress Notes (Signed)
Pre visit review using our clinic review tool, if applicable. No additional management support is needed unless otherwise documented below in the visit note. 

## 2014-09-06 NOTE — Telephone Encounter (Signed)
-----   Message from Jinny Sanders, MD sent at 09/06/2014  2:31 PM EST ----- Notify pt nml uric acid, lightly elevated white blood cells will treat with antibiotics to cover cellulitis. Call in doxycycline 100 mg BID x 10 day, 20,  0 RF

## 2014-09-06 NOTE — Progress Notes (Signed)
Subjective:    Patient ID: [name] [name], female    DOB: 12-02-1945, 69 y.o.   MRN: [id number]  HPI   69 year old female seen < 1 week ago ( 2/9)  for ? Allergic reaction to unknown trigger causing angioedema presents for new onset throbbing pain in right hand in last  2 days.  Slihgt red in palm, but very sore to touch.  No rash, no bite.  No numbness, no weakness.  No falls. She is still taking the celexa  She has soaked hand in salt water. Caused burning.   She was given oral prednisone taper x 6 days. Has one dose left  No further lip and tounge swelling. No SOB.  Some nasal congestion   FBS 160, 2 hour post prandial  She had injection of cortisone in feet  2/11 for plantar fasciitis. She has been pushing wheelchair some since she cannot stand with plantar fasciitis. Right handed.  No hx of gout.   ? Bone density.. Never has a DEXA.   Review of Systems  Constitutional: Negative for fever and fatigue.  HENT: Negative for ear pain.   Eyes: Negative for pain.  Respiratory: Negative for chest tightness and shortness of breath.   Cardiovascular: Negative for chest pain, palpitations and leg swelling.  Gastrointestinal: Negative for abdominal pain.  Genitourinary: Negative for dysuria.       Objective:   Physical Exam  Constitutional: Vital signs are normal. She appears well-developed and well-nourished. She is cooperative.  Non-toxic appearance. She does not appear ill. No distress.  HENT:  Head: Normocephalic.  Right Ear: Hearing, tympanic membrane, external ear and ear canal normal. Tympanic membrane is not erythematous, not retracted and not bulging.  Left Ear: Hearing, tympanic membrane, external ear and ear canal normal. Tympanic membrane is not erythematous, not retracted and not bulging.  Nose: No mucosal edema or rhinorrhea. Right sinus exhibits no maxillary sinus tenderness and no frontal sinus tenderness. Left sinus exhibits no maxillary sinus tenderness and  no frontal sinus tenderness.  Mouth/Throat: Uvula is midline, oropharynx is clear and moist and mucous membranes are normal.  Eyes: Conjunctivae, EOM and lids are normal. Pupils are equal, round, and reactive to light. Lids are everted and swept, no foreign bodies found.  Neck: Trachea normal and normal range of motion. Neck supple. Carotid bruit is not present. No thyroid mass and no thyromegaly present.  Cardiovascular: Normal rate, regular rhythm, S1 normal, S2 normal, normal heart sounds, intact distal pulses and normal pulses.  Exam reveals no gallop and no friction rub.   No murmur heard. Pulmonary/Chest: Effort normal and breath sounds normal. No tachypnea. No respiratory distress. She has no decreased breath sounds. She has no wheezes. She has no rhonchi. She has no rales.  Abdominal: Soft. Normal appearance and bowel sounds are normal. There is no tenderness.  Musculoskeletal:       Right hand: She exhibits tenderness and bony tenderness. She exhibits normal range of motion, normal capillary refill, no deformity and no swelling. Normal sensation noted. Normal strength noted.       Left hand: Normal.       Hands: Areas of greatest pain, on palm small area of redness, warmth, no open skin, not clearly cellulitis.  Neurological: She is alert.  Skin: Skin is warm, dry and intact. No rash noted.  Psychiatric: Her speech is normal and behavior is normal. Judgment and thought content normal. Her mood appears not anxious. Cognition and memory are normal.  She does not exhibit a depressed mood.          Assessment & Plan:

## 2014-09-06 NOTE — Telephone Encounter (Signed)
Ms. Dickison notified as instructed by telephone.  Doxycycline 100 mg sent to [facility] as instructed by Dr. Diona Browner.

## 2014-09-29 ENCOUNTER — Encounter: Payer: Self-pay | Admitting: Podiatry

## 2014-09-29 ENCOUNTER — Ambulatory Visit (INDEPENDENT_AMBULATORY_CARE_PROVIDER_SITE_OTHER): Payer: Medicare Other | Admitting: Podiatry

## 2014-09-29 VITALS — BP 122/61 | HR 59 | Resp 16

## 2014-09-29 DIAGNOSIS — M722 Plantar fascial fibromatosis: Secondary | ICD-10-CM | POA: Diagnosis not present

## 2014-09-29 DIAGNOSIS — M76821 Posterior tibial tendinitis, right leg: Secondary | ICD-10-CM

## 2014-09-29 DIAGNOSIS — M7751 Other enthesopathy of right foot: Secondary | ICD-10-CM

## 2014-09-29 NOTE — Progress Notes (Signed)
She presents today for follow-up of her bilateral plantar fasciitis. She states the left one is doing okay but the right foot is starting to hurt again. She refers to the right plantar fascial insertion site as well as the right anterior and anterior medial ankle. It hurts here as she points to the dorsal or anterior medial aspect of the right ankle.  Objective: Vital signs are stable alert and oriented 3. Pulses are palpable bilateral. Neurologic sensorium is intact per Semmes-Weinstein monofilament. She has pain on palpation medial calcaneal bone the right heel. Pain on palpation of the anterior ankle without crepitation. Dorsiflexion and plantar flexion is full.  Assessment: Capsulitis with fluid retention in the anterior ankle right. Possibly associated with plantar fasciitis.  Plan: Discussed the need for surgical intervention regarding the right plantar fascia however we will try to reduce the inflammation in the right ankle and an MRI may be necessary upon her next visit in 3-4 weeks. I injected her right ankle today after sterile Betadine skin prep.

## 2014-10-13 ENCOUNTER — Encounter: Payer: Self-pay | Admitting: Podiatry

## 2014-10-13 ENCOUNTER — Ambulatory Visit (INDEPENDENT_AMBULATORY_CARE_PROVIDER_SITE_OTHER): Payer: Medicare Other | Admitting: Podiatry

## 2014-10-13 VITALS — BP 104/54 | HR 61

## 2014-10-13 DIAGNOSIS — M722 Plantar fascial fibromatosis: Secondary | ICD-10-CM

## 2014-10-13 DIAGNOSIS — M7751 Other enthesopathy of right foot: Secondary | ICD-10-CM

## 2014-10-13 MED ORDER — MELOXICAM 15 MG PO TABS: 15.0000 mg | ORAL_TABLET | Freq: Every day | ORAL | Status: DC

## 2014-10-13 NOTE — Progress Notes (Signed)
   Subjective:    Patient ID: [name] [name], female    DOB: 10-20-1945, 69 y.o.   MRN: [id number]  HPI  Right foot pain, inj last visit, effective initially then pain returned from about the same as before or sometimes worse.  Review of Systems  All other systems reviewed and are negative.      Objective:   Physical Exam: Vital signs are stable she is alert and oriented 3. Pulses remain palpable right foot. She still has moderate to severe pain on palpation medial calcaneal tubercle of the right heel. Mild tenderness on palpation of the peroneal tendons tibialis anterior tendon and the tibialis posterior tendon she also has pain on palpation of the lateral aspect of the foot as well as the forefoot particularly the second metatarsophalangeal joint.        Assessment & Plan:  Assessment: Plantar fasciitis with lateral compensatory syndrome. Tendinitis tibial tendons right.  Plan: I placed her in a Cam Walker and scheduled her for physical therapy. I will follow up with her in approximately 1 month

## 2014-10-17 ENCOUNTER — Telehealth: Payer: Self-pay | Admitting: *Deleted

## 2014-10-17 NOTE — Telephone Encounter (Signed)
Vivien Rota states pt is scheduled for PT tomorrow, without a rx.  I reviewed pt's office visits and did not see recommendations for out-of-office PT, I did see a home PT.

## 2014-10-18 DIAGNOSIS — M25571 Pain in right ankle and joints of right foot: Secondary | ICD-10-CM | POA: Diagnosis not present

## 2014-10-18 DIAGNOSIS — S96911D Strain of unspecified muscle and tendon at ankle and foot level, right foot, subsequent encounter: Secondary | ICD-10-CM | POA: Diagnosis not present

## 2014-10-18 DIAGNOSIS — M67971 Unspecified disorder of synovium and tendon, right ankle and foot: Secondary | ICD-10-CM | POA: Diagnosis not present

## 2014-10-18 DIAGNOSIS — M6281 Muscle weakness (generalized): Secondary | ICD-10-CM | POA: Diagnosis not present

## 2014-10-19 ENCOUNTER — Other Ambulatory Visit: Payer: Self-pay | Admitting: Family Medicine

## 2014-10-20 DIAGNOSIS — M67971 Unspecified disorder of synovium and tendon, right ankle and foot: Secondary | ICD-10-CM | POA: Diagnosis not present

## 2014-10-20 DIAGNOSIS — M25571 Pain in right ankle and joints of right foot: Secondary | ICD-10-CM | POA: Diagnosis not present

## 2014-10-20 DIAGNOSIS — S96911D Strain of unspecified muscle and tendon at ankle and foot level, right foot, subsequent encounter: Secondary | ICD-10-CM | POA: Diagnosis not present

## 2014-10-20 DIAGNOSIS — M6281 Muscle weakness (generalized): Secondary | ICD-10-CM | POA: Diagnosis not present

## 2014-10-21 DIAGNOSIS — M67971 Unspecified disorder of synovium and tendon, right ankle and foot: Secondary | ICD-10-CM | POA: Diagnosis not present

## 2014-10-21 DIAGNOSIS — M25571 Pain in right ankle and joints of right foot: Secondary | ICD-10-CM | POA: Diagnosis not present

## 2014-10-21 DIAGNOSIS — M6281 Muscle weakness (generalized): Secondary | ICD-10-CM | POA: Diagnosis not present

## 2014-10-21 DIAGNOSIS — S96911D Strain of unspecified muscle and tendon at ankle and foot level, right foot, subsequent encounter: Secondary | ICD-10-CM | POA: Diagnosis not present

## 2014-10-24 DIAGNOSIS — M6281 Muscle weakness (generalized): Secondary | ICD-10-CM | POA: Diagnosis not present

## 2014-10-24 DIAGNOSIS — M25571 Pain in right ankle and joints of right foot: Secondary | ICD-10-CM | POA: Diagnosis not present

## 2014-10-24 DIAGNOSIS — S96911D Strain of unspecified muscle and tendon at ankle and foot level, right foot, subsequent encounter: Secondary | ICD-10-CM | POA: Diagnosis not present

## 2014-10-24 DIAGNOSIS — M67971 Unspecified disorder of synovium and tendon, right ankle and foot: Secondary | ICD-10-CM | POA: Diagnosis not present

## 2014-10-25 NOTE — Telephone Encounter (Signed)
Dr. Marta Antu the physical therapy orders.

## 2014-10-26 DIAGNOSIS — S96911D Strain of unspecified muscle and tendon at ankle and foot level, right foot, subsequent encounter: Secondary | ICD-10-CM | POA: Diagnosis not present

## 2014-10-26 DIAGNOSIS — M6281 Muscle weakness (generalized): Secondary | ICD-10-CM | POA: Diagnosis not present

## 2014-10-26 DIAGNOSIS — M67971 Unspecified disorder of synovium and tendon, right ankle and foot: Secondary | ICD-10-CM | POA: Diagnosis not present

## 2014-10-26 DIAGNOSIS — M25571 Pain in right ankle and joints of right foot: Secondary | ICD-10-CM | POA: Diagnosis not present

## 2014-10-28 DIAGNOSIS — S96911D Strain of unspecified muscle and tendon at ankle and foot level, right foot, subsequent encounter: Secondary | ICD-10-CM | POA: Diagnosis not present

## 2014-10-28 DIAGNOSIS — M6281 Muscle weakness (generalized): Secondary | ICD-10-CM | POA: Diagnosis not present

## 2014-10-28 DIAGNOSIS — M67971 Unspecified disorder of synovium and tendon, right ankle and foot: Secondary | ICD-10-CM | POA: Diagnosis not present

## 2014-10-28 DIAGNOSIS — M25571 Pain in right ankle and joints of right foot: Secondary | ICD-10-CM | POA: Diagnosis not present

## 2014-10-31 DIAGNOSIS — M6281 Muscle weakness (generalized): Secondary | ICD-10-CM | POA: Diagnosis not present

## 2014-10-31 DIAGNOSIS — S96911D Strain of unspecified muscle and tendon at ankle and foot level, right foot, subsequent encounter: Secondary | ICD-10-CM | POA: Diagnosis not present

## 2014-10-31 DIAGNOSIS — M25571 Pain in right ankle and joints of right foot: Secondary | ICD-10-CM | POA: Diagnosis not present

## 2014-10-31 DIAGNOSIS — M67971 Unspecified disorder of synovium and tendon, right ankle and foot: Secondary | ICD-10-CM | POA: Diagnosis not present

## 2014-11-02 DIAGNOSIS — S96911D Strain of unspecified muscle and tendon at ankle and foot level, right foot, subsequent encounter: Secondary | ICD-10-CM | POA: Diagnosis not present

## 2014-11-02 DIAGNOSIS — M25571 Pain in right ankle and joints of right foot: Secondary | ICD-10-CM | POA: Diagnosis not present

## 2014-11-02 DIAGNOSIS — M6281 Muscle weakness (generalized): Secondary | ICD-10-CM | POA: Diagnosis not present

## 2014-11-02 DIAGNOSIS — M67971 Unspecified disorder of synovium and tendon, right ankle and foot: Secondary | ICD-10-CM | POA: Diagnosis not present

## 2014-11-03 ENCOUNTER — Encounter: Payer: Self-pay | Admitting: Podiatry

## 2014-11-03 ENCOUNTER — Ambulatory Visit (INDEPENDENT_AMBULATORY_CARE_PROVIDER_SITE_OTHER): Payer: Medicare Other | Admitting: Podiatry

## 2014-11-03 VITALS — BP 118/51 | HR 69 | Resp 16

## 2014-11-03 DIAGNOSIS — M7751 Other enthesopathy of right foot: Secondary | ICD-10-CM

## 2014-11-03 DIAGNOSIS — M76821 Posterior tibial tendinitis, right leg: Secondary | ICD-10-CM

## 2014-11-03 NOTE — Progress Notes (Signed)
She presents today for follow-up of her posterior tibial tendinitis right foot and ankle. She continues to visit physical therapy on a regular basis. She states that she is doing much better.  Objective: Vital signs are stable she is alert and oriented 3 she still has tenderness on palpation posterior tibial tendon right foot and ankle.  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  Plan: Continue physical therapy follow-up with me in 1 month

## 2014-11-07 DIAGNOSIS — M25571 Pain in right ankle and joints of right foot: Secondary | ICD-10-CM | POA: Diagnosis not present

## 2014-11-07 DIAGNOSIS — S96911D Strain of unspecified muscle and tendon at ankle and foot level, right foot, subsequent encounter: Secondary | ICD-10-CM | POA: Diagnosis not present

## 2014-11-07 DIAGNOSIS — M6281 Muscle weakness (generalized): Secondary | ICD-10-CM | POA: Diagnosis not present

## 2014-11-07 DIAGNOSIS — M67971 Unspecified disorder of synovium and tendon, right ankle and foot: Secondary | ICD-10-CM | POA: Diagnosis not present

## 2014-11-09 DIAGNOSIS — M25571 Pain in right ankle and joints of right foot: Secondary | ICD-10-CM | POA: Diagnosis not present

## 2014-11-09 DIAGNOSIS — S96911D Strain of unspecified muscle and tendon at ankle and foot level, right foot, subsequent encounter: Secondary | ICD-10-CM | POA: Diagnosis not present

## 2014-11-09 DIAGNOSIS — M6281 Muscle weakness (generalized): Secondary | ICD-10-CM | POA: Diagnosis not present

## 2014-11-09 DIAGNOSIS — M67971 Unspecified disorder of synovium and tendon, right ankle and foot: Secondary | ICD-10-CM | POA: Diagnosis not present

## 2014-11-14 DIAGNOSIS — M6281 Muscle weakness (generalized): Secondary | ICD-10-CM | POA: Diagnosis not present

## 2014-11-14 DIAGNOSIS — M67971 Unspecified disorder of synovium and tendon, right ankle and foot: Secondary | ICD-10-CM | POA: Diagnosis not present

## 2014-11-14 DIAGNOSIS — S96911D Strain of unspecified muscle and tendon at ankle and foot level, right foot, subsequent encounter: Secondary | ICD-10-CM | POA: Diagnosis not present

## 2014-11-14 DIAGNOSIS — M25571 Pain in right ankle and joints of right foot: Secondary | ICD-10-CM | POA: Diagnosis not present

## 2014-11-16 DIAGNOSIS — S96911D Strain of unspecified muscle and tendon at ankle and foot level, right foot, subsequent encounter: Secondary | ICD-10-CM | POA: Diagnosis not present

## 2014-11-16 DIAGNOSIS — M6281 Muscle weakness (generalized): Secondary | ICD-10-CM | POA: Diagnosis not present

## 2014-11-16 DIAGNOSIS — M25571 Pain in right ankle and joints of right foot: Secondary | ICD-10-CM | POA: Diagnosis not present

## 2014-11-16 DIAGNOSIS — M67971 Unspecified disorder of synovium and tendon, right ankle and foot: Secondary | ICD-10-CM | POA: Diagnosis not present

## 2014-11-22 DIAGNOSIS — E780 Pure hypercholesterolemia: Secondary | ICD-10-CM | POA: Diagnosis not present

## 2014-11-22 DIAGNOSIS — E1165 Type 2 diabetes mellitus with hyperglycemia: Secondary | ICD-10-CM | POA: Diagnosis not present

## 2014-11-22 DIAGNOSIS — I10 Essential (primary) hypertension: Secondary | ICD-10-CM | POA: Diagnosis not present

## 2014-11-22 DIAGNOSIS — R740 Nonspecific elevation of levels of transaminase and lactic acid dehydrogenase [LDH]: Secondary | ICD-10-CM | POA: Diagnosis not present

## 2014-12-01 ENCOUNTER — Other Ambulatory Visit: Payer: Self-pay | Admitting: Family Medicine

## 2014-12-01 ENCOUNTER — Ambulatory Visit: Payer: Medicare Other | Admitting: Podiatry

## 2014-12-01 NOTE — Telephone Encounter (Signed)
Please call and schedule [facility] with fasting labs prior with Dr. Diona Browner.

## 2014-12-06 ENCOUNTER — Encounter: Payer: Self-pay | Admitting: Podiatry

## 2014-12-06 ENCOUNTER — Ambulatory Visit (INDEPENDENT_AMBULATORY_CARE_PROVIDER_SITE_OTHER): Payer: Medicare Other | Admitting: Podiatry

## 2014-12-06 VITALS — BP 128/70 | HR 64 | Resp 12

## 2014-12-06 DIAGNOSIS — M76821 Posterior tibial tendinitis, right leg: Secondary | ICD-10-CM

## 2014-12-06 DIAGNOSIS — M722 Plantar fascial fibromatosis: Secondary | ICD-10-CM

## 2014-12-06 NOTE — Progress Notes (Signed)
She presents today after having completed physical therapy regarding her right foot including her posterior tibial tendon and her plantar fasciitis. At this point she states it is still painful and really isn't getting any better. She also states that her blood sugar is very high and her hemoglobin A1c is severely elevated.  Objective: Vital signs are stable she is alert and oriented 3. Pulses are palpable bilateral. She has moderate to severe pain on palpation of the posterior tibial tendon with considerable amount of soft tissue fluctuance on palpation. She has pain with inversion against resistance and she continues to have pain on palpation of the medial calcaneal tubercle at the plantar fascial calcaneal insertion site.  Assessment: Plantar fasciitis resulting in lateral compensatory syndrome and posterior tibial tendinitis. This is probably resulted in a posterior tibial tendon split tear at this point.  Plan: Discussed etiology pathology concerned over surgical therapies. At this point an MRI is necessary for surgical evaluation of a possible tear of the posterior tibial tendon right foot and ankle.  Make sure we get clearance from her diabetes doctor with an elevated A1c.

## 2014-12-24 ENCOUNTER — Ambulatory Visit
Admission: RE | Admit: 2014-12-24 | Discharge: 2014-12-24 | Disposition: A | Discharge status: Home / Self Care | Payer: Medicare Other | Source: Ambulatory Visit | Attending: Podiatry | Admitting: Podiatry

## 2014-12-24 DIAGNOSIS — M76821 Posterior tibial tendinitis, right leg: Secondary | ICD-10-CM

## 2014-12-24 DIAGNOSIS — M722 Plantar fascial fibromatosis: Secondary | ICD-10-CM

## 2014-12-24 DIAGNOSIS — M79671 Pain in right foot: Secondary | ICD-10-CM | POA: Diagnosis not present

## 2015-01-03 ENCOUNTER — Telehealth: Payer: Self-pay | Admitting: *Deleted

## 2015-01-03 NOTE — Telephone Encounter (Signed)
Dr. Milinda Pointer reviewed pt's MRI and ordered pt to be seen in the office to discuss options, must be in air fx walker at all times weightbearing.  Pt is informed of orders and states understanding, and is transferred to schedulers.

## 2015-01-05 ENCOUNTER — Ambulatory Visit (INDEPENDENT_AMBULATORY_CARE_PROVIDER_SITE_OTHER): Payer: Medicare Other | Admitting: Podiatry

## 2015-01-05 ENCOUNTER — Encounter: Payer: Self-pay | Admitting: Podiatry

## 2015-01-05 VITALS — BP 125/68 | HR 70 | Resp 12

## 2015-01-05 DIAGNOSIS — M76821 Posterior tibial tendinitis, right leg: Secondary | ICD-10-CM | POA: Diagnosis not present

## 2015-01-05 DIAGNOSIS — M722 Plantar fascial fibromatosis: Secondary | ICD-10-CM

## 2015-01-05 NOTE — Progress Notes (Signed)
She presents today for follow-up of her painful right ankle. She states that it has not changed from previous visit. She states that she is unable to wear the Cam Walker because it makes her ankle hurt worse. She states that her diabetes seems to be improving however she does not know an accurate A1c.  Objective: Vital signs are stable she is alert and oriented 3. Pulses are strongly palpable. At this point her MRI demonstrates not only moderate bunion deformity but also a tear of the posterior tibial tendon at the insertion on the navicular tuberosity. It also demonstrates chronic proximal plantar fasciitis right heel.  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  Plan: I encouraged her to continue to work on her blood sugar to get it down below 180 mg/dL so that we can safely perform surgical intervention to repair this right foot and ankle. With an elevated A1c of a 9 or 10 I'm not comparable repairing her foot at this point. I have encouraged her to consider nonweightbearing such as well chair or a knee scooter to limit her activity as to not rupture the posterior tibial tendon completely. We will contact her endocrinologist for clearance.

## 2015-01-09 ENCOUNTER — Encounter: Payer: Self-pay | Admitting: *Deleted

## 2015-01-10 ENCOUNTER — Telehealth: Payer: Self-pay | Admitting: Internal Medicine

## 2015-01-10 ENCOUNTER — Telehealth: Payer: Self-pay | Admitting: *Deleted

## 2015-01-10 MED ORDER — AMOXICILLIN-POT CLAVULANATE 875-125 MG PO TABS: 1.0000 | ORAL_TABLET | Freq: Two times a day (BID) | ORAL | Status: DC

## 2015-01-10 NOTE — Telephone Encounter (Signed)
Called and spoke to pt. Pt c/o a prod cough with yellow mucus x 3 months. Pt denies any other s/s and denies feeling "sick". Pt requesting recs. Pt has pending appt on 9/1. Pt last seen in 01/2014.  CY please advise.   Allergies  Allergen Reactions  . Doxycycline Hives  . Epinephrine Hcl     REACTION: Had tremors when it was given.  . Erythromycin Ethylsuccinate     REACTION: abd spasms  . Iohexol      Code: HIVES, Desc: pt needs premedicated-- hives on prev contrast study per md office     Current Outpatient Prescriptions on File Prior to Visit  Medication Sig Dispense Refill  . aspirin 81 MG tablet Take 81 mg by mouth daily.      . Calcium Carbonate-Vitamin D (CALCIUM + D PO) Take 1,200 mg by mouth 2 (two) times daily.     . citalopram (CELEXA) 20 MG tablet Take 20 mg by mouth daily.  6  . fluticasone (FLONASE) 50 MCG/ACT nasal spray 1-2 puffs each nostril, once or twice daily for allergy 16 g prn  . furosemide (LASIX) 20 MG tablet TAKE 1 TABLET EVERY DAY 90 tablet 1  . Insulin Pen Needle (B-D ULTRAFINE III SHORT PEN) 31G X 8 MM MISC Use to inject insulin two times a day.  Dx: E11.9 100 each 0  . Magnesium 500 MG CAPS Take 1 capsule by mouth daily.    . meloxicam (MOBIC) 15 MG tablet Take 1 tablet (15 mg total) by mouth daily. 30 tablet 3  . metoprolol (LOPRESSOR) 50 MG tablet TAKE 1 TABLET BY MOUTH TWICE A DAY 180 tablet 3  . NEXIUM 40 MG capsule TAKE 1 CAPSULE BY MOUTH DAILY 90 capsule 1  . nitrofurantoin (MACRODANTIN) 100 MG capsule Start 1 tab twice dailyt for UTI then change to 1 tab daily for prevention 30 capsule 11  . NOVOLIN 70/30 RELION (70-30) 100 UNIT/ML injection Inject 70 Units into the skin 2 (two) times daily.    Marland Kitchen omeprazole (PRILOSEC OTC) 20 MG tablet Take 20 mg by mouth daily as needed.     . ONE TOUCH ULTRA TEST test strip USE 2 TIMES A DAY (DIAG CODE 250.00) 100 each 11  . ONETOUCH DELICA LANCETS FINE MISC   6  . predniSONE (DELTASONE) 20 MG tablet 3 tabs by  mouth daily x 3 days, then 2 tabs by mouth daily x 2 days then 1 tab by mouth daily x 2 days 15 tablet 0  . temazepam (RESTORIL) 15 MG capsule Take 1-2 capsules (15-30 mg total) by mouth at bedtime as needed. 30 capsule 5  . traMADol (ULTRAM) 50 MG tablet TAKE 1 OR 2 TABLETS BY MOUTH EVERY 4-6 HOURS AS NEEDED FOR PAIN. 40 tablet 1   No current facility-administered medications on file prior to visit.

## 2015-01-10 NOTE — Telephone Encounter (Signed)
lmtcb x1 w/ family member

## 2015-01-10 NOTE — Telephone Encounter (Addendum)
-----   Message from Lolita Rieger sent at 01/10/2015  2:07 PM EDT ----- Requested MRI CD copy to be sent to our office, for Overread.  Copy of MRI 12/24/2014 right foot and report sent to [facility].  ----- Message -----    From: Garrel Ridgel, DPM    Sent: 12/26/2014   5:06 PM      To: Lolita Rieger  Please inform patient that we are sending out for an over read and there will be a slight delay.  Please send to [facility]>

## 2015-01-10 NOTE — Telephone Encounter (Signed)
Suggest we treat as bronchitis Offer augmentin 875, # 14, 1 twice daily, refill x 1. If she doesn't get better, call us back

## 2015-01-10 NOTE — Telephone Encounter (Signed)
Pt aware RX has been sent in. Nothing further needed

## 2015-01-12 ENCOUNTER — Telehealth: Payer: Self-pay

## 2015-01-12 NOTE — Telephone Encounter (Signed)
Diabetic Bundle. Left voicemail advising pt her A1C blood test is due. Pt advised to contact PCP's office to schedule.

## 2015-01-17 ENCOUNTER — Telehealth: Payer: Self-pay | Admitting: *Deleted

## 2015-01-17 NOTE — Telephone Encounter (Signed)
Entered in error

## 2015-01-24 ENCOUNTER — Other Ambulatory Visit: Payer: Self-pay | Admitting: Family Medicine

## 2015-01-24 NOTE — Telephone Encounter (Signed)
Last office visit 09/06/2014 for hand pain.  Last CPE 07/03/2012.  No future appointments scheduled.  Refill?

## 2015-01-24 NOTE — Telephone Encounter (Signed)
Please call and schedule CPE with fasting labs prior.  Needs appointment before medication can be refilled.  Please schedule then route back to me so I can give her enough medication to get her to her appointment.

## 2015-01-24 NOTE — Telephone Encounter (Signed)
Needs to schedule CPX, okay to refill until then

## 2015-01-25 NOTE — Telephone Encounter (Signed)
Call spoke to [place] pt daughter.  Ermalinda Barrios stated at this time ms Ong could not make an appointment.  Ms Esguerra husband is very sick and probably won't make it.  Ermalinda Barrios wanted to know if that would mess with [facility] rx refill.  i told i would let Dr Diona Browner know and then we would call her.

## 2015-01-26 NOTE — Telephone Encounter (Signed)
I will refill med, but pt needs to make appt as soon as she can. Please give my good wishes for pt's husbands health.

## 2015-02-13 ENCOUNTER — Encounter: Payer: Self-pay | Admitting: Family Medicine

## 2015-02-13 ENCOUNTER — Telehealth: Payer: Self-pay

## 2015-02-13 NOTE — Telephone Encounter (Signed)
Patient declined any information on scheduling a Mammogram.

## 2015-02-19 ENCOUNTER — Other Ambulatory Visit: Payer: Self-pay | Admitting: Family Medicine

## 2015-02-19 NOTE — Telephone Encounter (Signed)
Last office visit 09/06/2014.  Last refilled 08/31/2013 for #90 with 1 refill.  Ok to refill?

## 2015-02-20 NOTE — Telephone Encounter (Signed)
Please call and schedule Medicare Wellness with fasting labs prior with Dr. Diona Browner or at least a Diabetic Follow Up.  Will need appointment in order to keep getting refills on her medications.

## 2015-02-20 NOTE — Telephone Encounter (Signed)
Pt overdue for medicare wellness or at least DM 3 mont follow up. Can refill until then once appt made.

## 2015-03-23 ENCOUNTER — Ambulatory Visit: Payer: Medicare Other | Admitting: Internal Medicine

## 2015-03-24 ENCOUNTER — Ambulatory Visit: Payer: Medicare Other | Admitting: Internal Medicine

## 2015-03-30 ENCOUNTER — Encounter: Payer: Self-pay | Admitting: Internal Medicine

## 2015-03-30 ENCOUNTER — Ambulatory Visit (INDEPENDENT_AMBULATORY_CARE_PROVIDER_SITE_OTHER): Payer: Medicare Other | Admitting: Internal Medicine

## 2015-03-30 VITALS — BP 140/70 | HR 65 | Ht 65.0 in | Wt 215.2 lb

## 2015-03-30 DIAGNOSIS — K862 Cyst of pancreas: Secondary | ICD-10-CM | POA: Diagnosis not present

## 2015-03-30 DIAGNOSIS — K219 Gastro-esophageal reflux disease without esophagitis: Secondary | ICD-10-CM

## 2015-03-30 DIAGNOSIS — Z23 Encounter for immunization: Secondary | ICD-10-CM | POA: Diagnosis not present

## 2015-03-30 DIAGNOSIS — G4733 Obstructive sleep apnea (adult) (pediatric): Secondary | ICD-10-CM | POA: Diagnosis not present

## 2015-03-30 DIAGNOSIS — R911 Solitary pulmonary nodule: Secondary | ICD-10-CM | POA: Diagnosis not present

## 2015-03-30 DIAGNOSIS — R059 Cough, unspecified: Secondary | ICD-10-CM

## 2015-03-30 DIAGNOSIS — R05 Cough: Secondary | ICD-10-CM

## 2015-03-30 MED ORDER — UMECLIDINIUM-VILANTEROL 62.5-25 MCG/INH IN AEPB
1.0000 | INHALATION_SPRAY | Freq: Every day | RESPIRATORY_TRACT | Status: DC
Start: 2015-03-30 — End: 2015-06-29

## 2015-03-30 NOTE — Progress Notes (Signed)
01/24/12- 69 yoF never smoker last here in the way of 2000  was being followed for obstructive sleep apnea and a right lower lobe nodule. She comes now to reestablish. She needs replacement CPAP machine. PCP Dr Diona Browner NPSG 07/31/03- AHI 42/ hr, CPAP then titrated to 9. Weight was 200 lbs. She had been using CPAP 6-8/Advanced with good compliance and control. Evaluation in 2010 had indicated stable lung nodules in RLL. She now reports a recent chest cold which has left her with cough productive of thick white phlegm. She thinks it will clear up on its own. Bedtime 11:50 PM, short sleep latency, waking 3 times during the night before up between 6 and 7 AM. CT chest w/o cm 01/21/11-we reviewed the images and discussed the right lower lobe nodular density interpreted as mucous plug. IMPRESSION:  1. Subpleural scarring in the right lower lobe, adjacent to  prominent osteophytes.  2. Probable chronic mucoid impaction in the medial right lower  lobe, unchanged from 05/15/2006.  3. 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  Original Report Authenticated By: Luretha Rued, M.D.   06/30/12- 69 yoF never smoker followed for OSA, hx RLL nodule/ mucus plug FOLLOWS FOR: still needs new machine-[facility] telling her that her machine is not broken(unfixable). CPAP 6-8/Advanced. Since she got her new mask she has wakened sometimes in a panic. Nasal pillows don't fit right. Gets frontal headache lying down with CPAP on. Phlegm in her throat in the morning. The machine is more than 69 years old and options are not working reliably.  01/29/13- 69 yoF never smoker followed for OSA, hx RLL nodule/ mucus plug FOLLOWS FOR: ov for new CPAP machine thru [facility].  Reports is much better since last ov with new CPAP mask.  wearing CPAP every night for x6-8 hours CPAP ?7/ Advanced Combination of Astelin and Flonase nasal sprays overdries.   01/31/14- 69 yoF never smoker followed for OSA, hx  RLL nodule/ mucus plug FOLLOWS FOR:  Wearing CPAP 1 hour per night due to waking up feeling like machine is pushing out too much pressure and very dry mouth when waking.  Getting no rest and hoarse She has not been sleeping as well since replacement machine CPAP/ 7/Advanced . Feels overdry. This may have partly begun when she had dental work sometime ago. Admits she listens out for her elderly husband is out of bed a lot at night. Admits reflux despite Nexium twice daily plus Zantac. Throat tickle. Would like to retry temazepam instead of trazodone which was tried by her primary physician.  04/09/15- 69 yoF never smoker followed for OSA, hx RLL nodule/ mucus plug FOLLOWS FOR:  sleep apnea--has not been using her machine for many months.  she is not sure how long. she stated that the new machine feels like she is having too much pressure and a very dry mouth CPAP auto 5-15/ Advanced CXR 08/31/13- non lung nodule noted Download shows suboptimal utilization had about 60% of days greater than 4 hours use. A fixed pressure of 12 would've work if we changed from [employer]. When husband died she got less regular with CPAP use. Describes dry cough for several months. Took Augmentin in June which gave temporary help. Aware of GERD. Denies wheeze or shortness of breath.  ROS-see HPI Constitutional:   No-   weight loss, night sweats, fevers, chills, fatigue, lassitude. HEENT:   +headaches, no-difficulty swallowing, tooth/dental problems, sore throat,  No-  sneezing, itching, ear ache, nasal congestion, +post nasal drip,  CV:  No-   chest pain, orthopnea, PND, swelling in lower extremities, anasarca, dizziness, palpitations Resp: No- shortness of breath with exertion or at rest.              No- productive cough,  No non-productive cough,  No- coughing up of blood.              No- change in color of mucus.  No- wheezing.   Skin: No- rash or lesions. GI:   +heartburn, indigestion, no-abdominal pain,  nausea, vomiting,  GU:  MS:  +  joint pain or swelling.   Neuro-     nothing unusual Psych:  No- change in mood or affect. No depression or anxiety.  No memory loss.  OBJ- Physical Exam General- Alert, Oriented, Affect-appropriate, Distress- none acute, overweight Skin- rash-none, lesions- none, excoriation- none Lymphadenopathy- none Head- atraumatic            Eyes- Gross vision intact, PERRLA, conjunctivae and secretions clear            Ears- Hearing, canals-normal            Nose- + turbinate edema, no-Septal dev, mucus, polyps, erosion, perforation             Throat- Mallampati III-IV , mucosa clear-not dry , drainage- none, tonsils- atrophic, own teeth Neck- flexible , trachea midline, no stridor , thyroid nl, carotid no bruit Chest - symmetrical excursion , unlabored           Heart/CV- RRR , no murmur , no gallop  , no rub, nl s1 s2                           - JVD- none , edema- none, stasis changes- none, varices- none           Lung- clear to P&A, wheeze- none, cough- none , dullness-none, rub- none           Chest wall-  Abd-  Br/ Gen/ Rectal- Not done, not indicated Extrem- cyanosis- none, clubbing, none, atrophy- none, strength- nl Neuro- grossly intact to observation

## 2015-03-30 NOTE — Patient Instructions (Signed)
Flu vax  Sample Anoro Ellipta inhaler   Inhale 1 puff, once see if this helps your cough  Order- CT chest and abdomen, no contrast,    Dx lung nodule, pancreatic cyst  Order- DME [facility] change CPAP to auto 4-10    Mask of choice, humidifier, supplies, Airview

## 2015-04-06 ENCOUNTER — Ambulatory Visit (INDEPENDENT_AMBULATORY_CARE_PROVIDER_SITE_OTHER)
Admission: RE | Admit: 2015-04-06 | Discharge: 2015-04-06 | Disposition: A | Discharge status: Home / Self Care | Payer: Medicare Other | Source: Ambulatory Visit | Attending: Internal Medicine | Admitting: Internal Medicine

## 2015-04-06 ENCOUNTER — Inpatient Hospital Stay: Admission: RE | Admit: 2015-04-06 | Payer: Medicare Other | Source: Ambulatory Visit

## 2015-04-06 ENCOUNTER — Other Ambulatory Visit: Payer: Medicare Other

## 2015-04-06 DIAGNOSIS — K862 Cyst of pancreas: Secondary | ICD-10-CM

## 2015-04-06 DIAGNOSIS — K5730 Diverticulosis of large intestine without perforation or abscess without bleeding: Secondary | ICD-10-CM | POA: Diagnosis not present

## 2015-04-06 DIAGNOSIS — R911 Solitary pulmonary nodule: Secondary | ICD-10-CM | POA: Diagnosis not present

## 2015-04-07 ENCOUNTER — Telehealth: Payer: Self-pay | Admitting: Internal Medicine

## 2015-04-07 ENCOUNTER — Telehealth: Payer: Self-pay

## 2015-04-07 NOTE — Telephone Encounter (Addendum)
Called spoke with pt. She reports the anoro only helps for about 4-6 hrs and then wears off. She just starts coughing again.  Pt takes macrobid once daily. Reports she read this could cause cough and stop if it does. Pt reports she has been coughing 6-9 months now and that's around when she started macrobid. She is on this for repeated UTI.  Allergies  Allergen Reactions  . Doxycycline Hives  . Epinephrine Hcl     REACTION: Had tremors when it was given.  . Erythromycin Ethylsuccinate     REACTION: abd spasms  . Iohexol      Code: HIVES, Desc: pt needs premedicated-- hives on prev contrast study per md office     To DOD in CY absence

## 2015-04-07 NOTE — Telephone Encounter (Signed)
Patient notified.  Nothing further needed. 

## 2015-04-07 NOTE — Telephone Encounter (Signed)
Opened in error

## 2015-04-07 NOTE — Telephone Encounter (Signed)
The only way to know about macrobid would be to stop it for a few weeks and see if cough goes away. Usually Macrobid is associated with a pattern of fine scarring in the lung, which was not seen on the CT scan just done. She does have areas of chonic bronchitis with scarring , called bronchiectasis. This resulted from old inflammation. Reflux and aspiration are often the cause of bronchiectasis, also chronic cough.

## 2015-04-07 NOTE — Telephone Encounter (Signed)
Patient wants to know if she needs to take anything else to replace the Anoro?  She said that the only thing she has right now is Albuterol.

## 2015-04-07 NOTE — Telephone Encounter (Signed)
Ok try off macrobid and d/c anoro if not helping

## 2015-04-07 NOTE — Progress Notes (Signed)
Quick Note:  Called and spoke with pt. Reviewed results and recs. Pt voiced understanding and had no further questions. ______ 

## 2015-04-11 DIAGNOSIS — M76821 Posterior tibial tendinitis, right leg: Secondary | ICD-10-CM | POA: Diagnosis not present

## 2015-04-11 DIAGNOSIS — M6701 Short Achilles tendon (acquired), right ankle: Secondary | ICD-10-CM | POA: Diagnosis not present

## 2015-04-25 ENCOUNTER — Telehealth: Payer: Self-pay | Admitting: Internal Medicine

## 2015-04-25 NOTE — Telephone Encounter (Signed)
Spoke with [name].  Order for pressure change and mask, supplies was ordered on 9/8.  They have attempted to contact patient several times, via telephone and via mail and has not been able to contact patient.  [name] wanted to make Dr. Annamaria Boots aware.  Attempted to contact patient using numbers in Epic, Mobile phone rang several times, then went to busy signal, home phone number is out of service.  FYI to Dr. Annamaria Boots.

## 2015-04-25 NOTE — Telephone Encounter (Signed)
Noted  

## 2015-05-06 DIAGNOSIS — R05 Cough: Secondary | ICD-10-CM | POA: Insufficient documentation

## 2015-05-06 DIAGNOSIS — R059 Cough, unspecified: Secondary | ICD-10-CM | POA: Insufficient documentation

## 2015-05-06 NOTE — Assessment & Plan Note (Signed)
I think her cough is mostly due to reflux but we will look for evidence of chronic bronchitis/bronchiectasis probably get follow-up CT chest. Reflux precautions emphasized. Use acid blocker discussed.

## 2015-05-06 NOTE — Assessment & Plan Note (Signed)
Emphasis on reflux precautions but also try sample Anoro and give flu vaccine

## 2015-05-06 NOTE — Assessment & Plan Note (Signed)
This is probably mucous plug. We discussed recheck CT chest and she was due for CT abdomen which she asks we get at same time

## 2015-05-06 NOTE — Assessment & Plan Note (Signed)
Discussed compliance goals and medical reasons. I think she is willing to try to do better. Plan-try reducing auto pressure range to 4-10 for comfort after discussion. Encourage weight loss

## 2015-05-18 ENCOUNTER — Encounter: Payer: Self-pay | Admitting: Podiatry

## 2015-05-19 ENCOUNTER — Other Ambulatory Visit: Payer: Self-pay | Admitting: Family Medicine

## 2015-05-20 NOTE — Telephone Encounter (Signed)
Last office visit 09/06/2014. Last refilled 02/19/2015 for #90 with 0 refill. Last refill states needs an appointment.  No future appointments scheduled. Refill?

## 2015-05-22 ENCOUNTER — Encounter: Payer: Self-pay | Admitting: Internal Medicine

## 2015-06-05 ENCOUNTER — Telehealth: Payer: Self-pay | Admitting: Family Medicine

## 2015-06-05 ENCOUNTER — Other Ambulatory Visit: Payer: Self-pay | Admitting: Family Medicine

## 2015-06-05 NOTE — Telephone Encounter (Signed)
Pt called wanting to switch from dr Diona Browner to dr Damita Dunnings for primary care Is it ok to schedule?? She needs to get meds refill  Sent to triage

## 2015-06-06 NOTE — Telephone Encounter (Signed)
Dr. Diona Browner- Please talk to me about this.

## 2015-06-06 NOTE — Telephone Encounter (Signed)
She is a sweet lady, but very noncompliant with her diabetes care, appointments and medications. I will discuss with you today.

## 2015-06-07 NOTE — Telephone Encounter (Signed)
Tried calling pt  No answer

## 2015-06-07 NOTE — Telephone Encounter (Signed)
Needs to keep current PCP.  I am not taking new medicare patients.

## 2015-06-08 DIAGNOSIS — E7800 Pure hypercholesterolemia, unspecified: Secondary | ICD-10-CM | POA: Diagnosis not present

## 2015-06-08 DIAGNOSIS — I10 Essential (primary) hypertension: Secondary | ICD-10-CM | POA: Diagnosis not present

## 2015-06-08 DIAGNOSIS — R740 Nonspecific elevation of levels of transaminase and lactic acid dehydrogenase [LDH]: Secondary | ICD-10-CM | POA: Diagnosis not present

## 2015-06-08 DIAGNOSIS — E1165 Type 2 diabetes mellitus with hyperglycemia: Secondary | ICD-10-CM | POA: Diagnosis not present

## 2015-06-12 NOTE — Telephone Encounter (Signed)
Left message asking pt to call office  °

## 2015-06-13 ENCOUNTER — Telehealth: Payer: Self-pay | Admitting: Primary Care

## 2015-06-13 NOTE — Telephone Encounter (Signed)
Noted  

## 2015-06-13 NOTE — Telephone Encounter (Signed)
No, I do not recommend this pt for [employer] or [facility].

## 2015-06-13 NOTE — Telephone Encounter (Signed)
See below note  Pt wanted to know if there was anyone else taking new medicare pt. Is it ok to schedule with Rollene Fare or Anda Kraft??

## 2015-06-21 DIAGNOSIS — M6701 Short Achilles tendon (acquired), right ankle: Secondary | ICD-10-CM | POA: Diagnosis not present

## 2015-06-21 DIAGNOSIS — M76821 Posterior tibial tendinitis, right leg: Secondary | ICD-10-CM | POA: Diagnosis not present

## 2015-06-29 ENCOUNTER — Encounter: Payer: Self-pay | Admitting: Family Medicine

## 2015-06-29 ENCOUNTER — Ambulatory Visit (INDEPENDENT_AMBULATORY_CARE_PROVIDER_SITE_OTHER): Payer: Medicare Other | Admitting: Family Medicine

## 2015-06-29 VITALS — BP 120/62 | HR 57 | Temp 98.5°F | Ht 65.0 in | Wt 212.5 lb

## 2015-06-29 DIAGNOSIS — Z23 Encounter for immunization: Secondary | ICD-10-CM

## 2015-06-29 DIAGNOSIS — T4145XA Adverse effect of unspecified anesthetic, initial encounter: Secondary | ICD-10-CM | POA: Diagnosis not present

## 2015-06-29 DIAGNOSIS — Z01818 Encounter for other preprocedural examination: Secondary | ICD-10-CM | POA: Diagnosis not present

## 2015-06-29 NOTE — Assessment & Plan Note (Addendum)
Recommend discussion with anesthesia prior to surgery to avoid recurrence of med SE/reaction (persistent tremor) she had in past. Pt is very anxious about upcoming surgery given past issues following surgery.

## 2015-06-29 NOTE — Progress Notes (Signed)
Subjective:    Patient ID: [name] [name], female    DOB: 1946-01-18, 69 y.o.   MRN: [id number]  HPI  69 year old female  presents for preoperative clearance for  Foot surgery, Dr. Doran Durand. Not scheduled yet.  Last time she had surgery.. She had tremors for 8 months.Clarnce Flock neuro at [facility]. Felt benign essential tremor, finally went away on its own. She is worried it will happen again.  She was told it was triggered with something with surgery. She had dental procedure  In last year and tremor with epinephrine.  Her husband passed away in 02-16-23. She is managing moderately well.  Bilateral hand ongoing in last 6 months, going to sleep, numb and achy, burning . Worse in last 3-4 days. No burning  In feet.  No neck pain. No change with moving head.   Diabetes:   Poor control last check on Relion 70/30  70 UNit Am then 10 at lunch and 60 at night.  She has been seeing Dr. Chalmers Cater  A1C was 8.2,  1-2 weeks ago.  Next appt inn Feb. Lab Results  Component Value Date   HGBA1C 9.9* 01/06/2014  FBS 109-159 in last week.  OSA and obesity.suggest small airway and possible difficult intubation, on CPAP but hard to stay on it with recent cough. Body mass index is 35.36 kg/(m^2).    BP Readings from Last 3 Encounters:  06/29/15 120/62  03/30/15 140/70  01/05/15 125/68   No cardiac history except for palpitations.. She is on metoprolol for this.  She denies SOB, no chest pain.  ECHO: nml 2013.. Dr. Cathie Olden.   She has bronchitis due to reflux in last few months ( causes cough, no ongoing infection).. Using mucinex, using nexium for refluxs. Albuterol prn. Pulm nodule CT Chest stable Panc nodule stable on abd CT.  No non smoker. Has had flu, due for flu.     Review of Systems  Constitutional: Negative for fever and fatigue.  HENT: Negative for ear pain.   Eyes: Negative for pain.  Respiratory: Positive for cough. Negative for shortness of breath and wheezing.   Cardiovascular:  Negative for chest pain.  Gastrointestinal: Negative for abdominal pain.       Objective:   Physical Exam  Constitutional: Vital signs are normal. She appears well-developed and well-nourished. She is cooperative.  Non-toxic appearance. She does not appear ill. No distress.  obese  HENT:  Head: Normocephalic.  Right Ear: Hearing, tympanic membrane, external ear and ear canal normal. Tympanic membrane is not erythematous, not retracted and not bulging.  Left Ear: Hearing, tympanic membrane, external ear and ear canal normal. Tympanic membrane is not erythematous, not retracted and not bulging.  Nose: No mucosal edema or rhinorrhea. Right sinus exhibits no maxillary sinus tenderness and no frontal sinus tenderness. Left sinus exhibits no maxillary sinus tenderness and no frontal sinus tenderness.  Mouth/Throat: Uvula is midline, oropharynx is clear and moist and mucous membranes are normal.  Eyes: Conjunctivae, EOM and lids are normal. Pupils are equal, round, and reactive to light. Lids are everted and swept, no foreign bodies found.  Neck: Trachea normal and normal range of motion. Neck supple. Carotid bruit is not present. No thyroid mass and no thyromegaly present.  Cardiovascular: Normal rate, regular rhythm, S1 normal, S2 normal, normal heart sounds, intact distal pulses and normal pulses.  Exam reveals no gallop and no friction rub.   No murmur heard. Pulmonary/Chest: Effort normal and breath sounds normal. No tachypnea.  No respiratory distress. She has no decreased breath sounds. She has no wheezes. She has no rhonchi. She has no rales.  Abdominal: Soft. Normal appearance and bowel sounds are normal. There is no tenderness.  Neurological: She is alert.  Skin: Skin is warm, dry and intact. No rash noted.  Psychiatric: Her speech is normal and behavior is normal. Judgment and thought content normal. Her mood appears not anxious. Cognition and memory are normal. She does not exhibit a  depressed mood.          Assessment & Plan:  Total visit time 25 minutes, > 50% spent counseling and cordinating patients care.

## 2015-06-29 NOTE — Patient Instructions (Signed)
Work on low carb diet and glucose control.

## 2015-06-29 NOTE — Assessment & Plan Note (Signed)
Moderate risk pt cleared to under go low level invasive surgery. Recommend improved DM control prior to and after surgery.  Given OSA and obesity.. Recommend incentive spirometry. No specific cardiac concerns. Prevnar given today.

## 2015-06-29 NOTE — Progress Notes (Signed)
Pre visit review using our clinic review tool, if applicable. No additional management support is needed unless otherwise documented below in the visit note. 

## 2015-07-03 ENCOUNTER — Other Ambulatory Visit: Payer: Self-pay | Admitting: Orthopedic Surgery

## 2015-07-19 DIAGNOSIS — M25571 Pain in right ankle and joints of right foot: Secondary | ICD-10-CM | POA: Diagnosis not present

## 2015-07-19 DIAGNOSIS — G8929 Other chronic pain: Secondary | ICD-10-CM | POA: Diagnosis not present

## 2015-07-20 ENCOUNTER — Encounter: Payer: Self-pay | Admitting: *Deleted

## 2015-07-21 ENCOUNTER — Encounter (HOSPITAL_BASED_OUTPATIENT_CLINIC_OR_DEPARTMENT_OTHER): Payer: Self-pay | Admitting: *Deleted

## 2015-07-26 ENCOUNTER — Encounter (HOSPITAL_BASED_OUTPATIENT_CLINIC_OR_DEPARTMENT_OTHER)
Admission: RE | Admit: 2015-07-26 | Discharge: 2015-07-26 | Disposition: A | Discharge status: Home / Self Care | Payer: Medicare Other | Source: Ambulatory Visit | Attending: Orthopedic Surgery | Admitting: Orthopedic Surgery

## 2015-07-26 ENCOUNTER — Other Ambulatory Visit: Payer: Self-pay

## 2015-07-26 DIAGNOSIS — M722 Plantar fascial fibromatosis: Secondary | ICD-10-CM | POA: Diagnosis not present

## 2015-07-26 DIAGNOSIS — K589 Irritable bowel syndrome without diarrhea: Secondary | ICD-10-CM | POA: Diagnosis not present

## 2015-07-26 DIAGNOSIS — M2141 Flat foot [pes planus] (acquired), right foot: Secondary | ICD-10-CM | POA: Diagnosis not present

## 2015-07-26 DIAGNOSIS — Z7982 Long term (current) use of aspirin: Secondary | ICD-10-CM | POA: Diagnosis not present

## 2015-07-26 DIAGNOSIS — Z79899 Other long term (current) drug therapy: Secondary | ICD-10-CM | POA: Diagnosis not present

## 2015-07-26 DIAGNOSIS — K219 Gastro-esophageal reflux disease without esophagitis: Secondary | ICD-10-CM | POA: Diagnosis not present

## 2015-07-26 DIAGNOSIS — G4730 Sleep apnea, unspecified: Secondary | ICD-10-CM | POA: Diagnosis not present

## 2015-07-26 DIAGNOSIS — M159 Polyosteoarthritis, unspecified: Secondary | ICD-10-CM | POA: Diagnosis not present

## 2015-07-26 DIAGNOSIS — M62471 Contracture of muscle, right ankle and foot: Secondary | ICD-10-CM | POA: Diagnosis not present

## 2015-07-26 DIAGNOSIS — Z794 Long term (current) use of insulin: Secondary | ICD-10-CM | POA: Diagnosis not present

## 2015-07-26 DIAGNOSIS — Z6834 Body mass index (BMI) 34.0-34.9, adult: Secondary | ICD-10-CM | POA: Diagnosis not present

## 2015-07-26 DIAGNOSIS — E7800 Pure hypercholesterolemia, unspecified: Secondary | ICD-10-CM | POA: Diagnosis not present

## 2015-07-26 DIAGNOSIS — E1142 Type 2 diabetes mellitus with diabetic polyneuropathy: Secondary | ICD-10-CM | POA: Diagnosis not present

## 2015-07-26 DIAGNOSIS — M76821 Posterior tibial tendinitis, right leg: Secondary | ICD-10-CM | POA: Diagnosis not present

## 2015-07-26 LAB — BASIC METABOLIC PANEL
Anion gap: 9 (ref 5–15)
BUN: 17 mg/dL (ref 6–20)
CO2: 25 mmol/L (ref 22–32)
CREATININE: 0.82 mg/dL (ref 0.44–1.00)
Calcium: 9.7 mg/dL (ref 8.9–10.3)
Chloride: 104 mmol/L (ref 101–111)
GFR calc Af Amer: >60 mL/min (ref >60)
GLUCOSE: 308 mg/dL — AB (ref 65–99)
POTASSIUM: 4.7 mmol/L (ref 3.5–5.1)
Sodium: 138 mmol/L (ref 135–145)

## 2015-07-26 NOTE — Progress Notes (Signed)
Notified Dr. Marcie Bal of Glucose 308 - ordered to recheck glucose in am by fingerstick.

## 2015-07-26 NOTE — Pre-Procedure Instructions (Signed)
Anesthesia consult done for surgery tomorrow with Dr. Marin Comment.

## 2015-07-27 ENCOUNTER — Ambulatory Visit (HOSPITAL_BASED_OUTPATIENT_CLINIC_OR_DEPARTMENT_OTHER): Payer: Medicare Other | Admitting: Certified Registered"

## 2015-07-27 ENCOUNTER — Ambulatory Visit (HOSPITAL_BASED_OUTPATIENT_CLINIC_OR_DEPARTMENT_OTHER)
Admission: RE | Admit: 2015-07-27 | Discharge: 2015-07-28 | Disposition: A | Discharge status: Home / Self Care | Payer: Medicare Other | Source: Ambulatory Visit | Attending: Orthopedic Surgery | Admitting: Orthopedic Surgery

## 2015-07-27 ENCOUNTER — Encounter (HOSPITAL_BASED_OUTPATIENT_CLINIC_OR_DEPARTMENT_OTHER): Payer: Self-pay | Admitting: Certified Registered"

## 2015-07-27 ENCOUNTER — Encounter (HOSPITAL_BASED_OUTPATIENT_CLINIC_OR_DEPARTMENT_OTHER)
Admission: RE | Disposition: A | Discharge status: Home / Self Care | Payer: Self-pay | Source: Ambulatory Visit | Attending: Orthopedic Surgery

## 2015-07-27 DIAGNOSIS — M76821 Posterior tibial tendinitis, right leg: Secondary | ICD-10-CM | POA: Diagnosis not present

## 2015-07-27 DIAGNOSIS — E7800 Pure hypercholesterolemia, unspecified: Secondary | ICD-10-CM | POA: Insufficient documentation

## 2015-07-27 DIAGNOSIS — M6701 Short Achilles tendon (acquired), right ankle: Secondary | ICD-10-CM | POA: Diagnosis not present

## 2015-07-27 DIAGNOSIS — G4730 Sleep apnea, unspecified: Secondary | ICD-10-CM | POA: Insufficient documentation

## 2015-07-27 DIAGNOSIS — M62471 Contracture of muscle, right ankle and foot: Secondary | ICD-10-CM | POA: Diagnosis not present

## 2015-07-27 DIAGNOSIS — Z9889 Other specified postprocedural states: Secondary | ICD-10-CM

## 2015-07-27 DIAGNOSIS — M79661 Pain in right lower leg: Secondary | ICD-10-CM | POA: Diagnosis not present

## 2015-07-27 DIAGNOSIS — M2141 Flat foot [pes planus] (acquired), right foot: Secondary | ICD-10-CM | POA: Diagnosis not present

## 2015-07-27 DIAGNOSIS — M722 Plantar fascial fibromatosis: Secondary | ICD-10-CM | POA: Insufficient documentation

## 2015-07-27 DIAGNOSIS — E1142 Type 2 diabetes mellitus with diabetic polyneuropathy: Secondary | ICD-10-CM | POA: Insufficient documentation

## 2015-07-27 DIAGNOSIS — K589 Irritable bowel syndrome without diarrhea: Secondary | ICD-10-CM | POA: Insufficient documentation

## 2015-07-27 DIAGNOSIS — Z79899 Other long term (current) drug therapy: Secondary | ICD-10-CM | POA: Insufficient documentation

## 2015-07-27 DIAGNOSIS — M159 Polyosteoarthritis, unspecified: Secondary | ICD-10-CM | POA: Insufficient documentation

## 2015-07-27 DIAGNOSIS — G8918 Other acute postprocedural pain: Secondary | ICD-10-CM | POA: Diagnosis not present

## 2015-07-27 DIAGNOSIS — Z794 Long term (current) use of insulin: Secondary | ICD-10-CM | POA: Insufficient documentation

## 2015-07-27 DIAGNOSIS — Z7982 Long term (current) use of aspirin: Secondary | ICD-10-CM | POA: Insufficient documentation

## 2015-07-27 DIAGNOSIS — Z6834 Body mass index (BMI) 34.0-34.9, adult: Secondary | ICD-10-CM | POA: Insufficient documentation

## 2015-07-27 DIAGNOSIS — K219 Gastro-esophageal reflux disease without esophagitis: Secondary | ICD-10-CM | POA: Insufficient documentation

## 2015-07-27 HISTORY — PX: GASTROCNEMIUS RECESSION: SHX863

## 2015-07-27 HISTORY — PX: CALCANEAL OSTEOTOMY: SHX1281

## 2015-07-27 HISTORY — DX: Unspecified osteoarthritis, unspecified site: M19.90

## 2015-07-27 LAB — GLUCOSE, CAPILLARY
GLUCOSE-CAPILLARY: 88 mg/dL (ref 65–99)
GLUCOSE-CAPILLARY: 63 mg/dL — AB (ref 65–99)
GLUCOSE-CAPILLARY: 118 mg/dL — AB (ref 65–99)
GLUCOSE-CAPILLARY: 235 mg/dL — AB (ref 65–99)
Glucose-Capillary: 87 mg/dL (ref 65–99)

## 2015-07-27 SURGERY — RECESSION, MUSCLE, GASTROCNEMIUS
Anesthesia: Regional | Site: Leg Lower | Laterality: Right

## 2015-07-27 MED ORDER — GLYCOPYRROLATE 0.2 MG/ML IJ SOLN: 0.2000 mg | Freq: Once | INTRAMUSCULAR | Status: DC | PRN

## 2015-07-27 MED ORDER — BUPIVACAINE HCL (PF) 0.5 % IJ SOLN
INTRAMUSCULAR | Status: DC | PRN
  Administered 2015-07-27: 30 mL via PERINEURAL

## 2015-07-27 MED ORDER — ONDANSETRON HCL 4 MG PO TABS: 4.0000 mg | ORAL_TABLET | Freq: Four times a day (QID) | ORAL | Status: DC | PRN

## 2015-07-27 MED ORDER — FENTANYL CITRATE (PF) 100 MCG/2ML IJ SOLN
50.0000 ug | INTRAMUSCULAR | Status: DC | PRN
  Administered 2015-07-27: 100 ug via INTRAVENOUS

## 2015-07-27 MED ORDER — ACETAMINOPHEN 650 MG RE SUPP: 650.0000 mg | Freq: Four times a day (QID) | RECTAL | Status: DC | PRN

## 2015-07-27 MED ORDER — PROPOFOL 10 MG/ML IV BOLUS
INTRAVENOUS | Status: DC | PRN
  Administered 2015-07-27: 160 mg via INTRAVENOUS

## 2015-07-27 MED ORDER — CEFAZOLIN SODIUM-DEXTROSE 2-3 GM-% IV SOLR
INTRAVENOUS | Status: AC
  Filled 2015-07-27: qty 50

## 2015-07-27 MED ORDER — SENNA 8.6 MG PO TABS: 1.0000 | ORAL_TABLET | Freq: Two times a day (BID) | ORAL | Status: DC

## 2015-07-27 MED ORDER — OXYCODONE HCL 5 MG PO TABS: 5.0000 mg | ORAL_TABLET | ORAL | Status: DC | PRN

## 2015-07-27 MED ORDER — ONDANSETRON HCL 4 MG/2ML IJ SOLN
INTRAMUSCULAR | Status: DC | PRN
  Administered 2015-07-27: 4 mg via INTRAVENOUS

## 2015-07-27 MED ORDER — INSULIN ASPART PROT & ASPART (70-30 MIX) 100 UNIT/ML ~~LOC~~ SUSP: 70.0000 [IU] | Freq: Two times a day (BID) | SUBCUTANEOUS | Status: DC

## 2015-07-27 MED ORDER — EPHEDRINE SULFATE 50 MG/ML IJ SOLN
INTRAMUSCULAR | Status: DC | PRN
  Administered 2015-07-27: 10 mg via INTRAVENOUS

## 2015-07-27 MED ORDER — SENNA 8.6 MG PO TABS: 2.0000 | ORAL_TABLET | Freq: Two times a day (BID) | ORAL | Status: DC

## 2015-07-27 MED ORDER — SODIUM CHLORIDE 0.9 % IV SOLN: INTRAVENOUS | Status: DC

## 2015-07-27 MED ORDER — NITROFURANTOIN MACROCRYSTAL 100 MG PO CAPS: 100.0000 mg | ORAL_CAPSULE | Freq: Once | ORAL | Status: DC

## 2015-07-27 MED ORDER — ONDANSETRON HCL 4 MG/2ML IJ SOLN
4.0000 mg | Freq: Four times a day (QID) | INTRAMUSCULAR | Status: DC | PRN
  Administered 2015-07-27 – 2015-07-28 (×3): 4 mg via INTRAVENOUS
  Filled 2015-07-27: qty 2

## 2015-07-27 MED ORDER — MORPHINE SULFATE (PF) 2 MG/ML IV SOLN
2.0000 mg | INTRAVENOUS | Status: DC | PRN
  Administered 2015-07-27 (×2): 2 mg via INTRAVENOUS
  Filled 2015-07-27 (×2): qty 1

## 2015-07-27 MED ORDER — OXYCODONE HCL 5 MG PO TABS
5.0000 mg | ORAL_TABLET | ORAL | Status: DC | PRN
Start: 2015-07-27 — End: 2015-07-28
  Administered 2015-07-27 – 2015-07-28 (×2): 5 mg via ORAL
  Filled 2015-07-27 (×3): qty 1

## 2015-07-27 MED ORDER — MIDAZOLAM HCL 2 MG/2ML IJ SOLN
INTRAMUSCULAR | Status: AC
  Filled 2015-07-27: qty 2

## 2015-07-27 MED ORDER — OXYCODONE HCL 5 MG/5ML PO SOLN: 5.0000 mg | Freq: Once | ORAL | Status: DC | PRN

## 2015-07-27 MED ORDER — DEXAMETHASONE SODIUM PHOSPHATE 10 MG/ML IJ SOLN
INTRAMUSCULAR | Status: DC | PRN
  Administered 2015-07-27: 10 mg via INTRAVENOUS

## 2015-07-27 MED ORDER — METOCLOPRAMIDE HCL 5 MG PO TABS: 5.0000 mg | ORAL_TABLET | Freq: Three times a day (TID) | ORAL | Status: DC | PRN

## 2015-07-27 MED ORDER — FUROSEMIDE 20 MG PO TABS: 20.0000 mg | ORAL_TABLET | Freq: Every day | ORAL | Status: DC

## 2015-07-27 MED ORDER — MIDAZOLAM HCL 2 MG/2ML IJ SOLN
1.0000 mg | INTRAMUSCULAR | Status: DC | PRN
Start: 2015-07-27 — End: 2015-07-27
  Administered 2015-07-27: 2 mg via INTRAVENOUS

## 2015-07-27 MED ORDER — ONDANSETRON HCL 4 MG/2ML IJ SOLN
INTRAMUSCULAR | Status: AC
  Filled 2015-07-27: qty 2

## 2015-07-27 MED ORDER — OXYCODONE HCL 5 MG PO TABS: 5.0000 mg | ORAL_TABLET | Freq: Once | ORAL | Status: DC | PRN

## 2015-07-27 MED ORDER — ENOXAPARIN SODIUM 40 MG/0.4ML ~~LOC~~ SOLN: 40.0000 mg | SUBCUTANEOUS | Status: DC

## 2015-07-27 MED ORDER — HYDROMORPHONE HCL 1 MG/ML IJ SOLN
INTRAMUSCULAR | Status: AC
  Filled 2015-07-27: qty 1

## 2015-07-27 MED ORDER — FENTANYL CITRATE (PF) 100 MCG/2ML IJ SOLN
INTRAMUSCULAR | Status: AC
  Filled 2015-07-27: qty 2

## 2015-07-27 MED ORDER — HYDROMORPHONE HCL 1 MG/ML IJ SOLN
0.2500 mg | INTRAMUSCULAR | Status: DC | PRN
  Administered 2015-07-27 (×3): 0.5 mg via INTRAVENOUS

## 2015-07-27 MED ORDER — METOCLOPRAMIDE HCL 5 MG/ML IJ SOLN: 5.0000 mg | Freq: Three times a day (TID) | INTRAMUSCULAR | Status: DC | PRN

## 2015-07-27 MED ORDER — PANTOPRAZOLE SODIUM 40 MG PO TBEC: 40.0000 mg | DELAYED_RELEASE_TABLET | Freq: Every day | ORAL | Status: DC

## 2015-07-27 MED ORDER — METOPROLOL TARTRATE 50 MG PO TABS
50.0000 mg | ORAL_TABLET | Freq: Two times a day (BID) | ORAL | Status: DC
  Administered 2015-07-27: 50 mg via ORAL

## 2015-07-27 MED ORDER — DIPHENHYDRAMINE HCL 12.5 MG/5ML PO ELIX: 12.5000 mg | ORAL_SOLUTION | ORAL | Status: DC | PRN

## 2015-07-27 MED ORDER — ASPIRIN EC 325 MG PO TBEC: 325.0000 mg | DELAYED_RELEASE_TABLET | Freq: Every day | ORAL | Status: DC

## 2015-07-27 MED ORDER — PHENYLEPHRINE 40 MCG/ML (10ML) SYRINGE FOR IV PUSH (FOR BLOOD PRESSURE SUPPORT)
PREFILLED_SYRINGE | INTRAVENOUS | Status: AC
  Filled 2015-07-27: qty 10

## 2015-07-27 MED ORDER — LIDOCAINE HCL (CARDIAC) 20 MG/ML IV SOLN
INTRAVENOUS | Status: AC
  Filled 2015-07-27: qty 5

## 2015-07-27 MED ORDER — DOCUSATE SODIUM 100 MG PO CAPS: 100.0000 mg | ORAL_CAPSULE | Freq: Two times a day (BID) | ORAL | Status: DC

## 2015-07-27 MED ORDER — DEXAMETHASONE SODIUM PHOSPHATE 10 MG/ML IJ SOLN
INTRAMUSCULAR | Status: AC
Start: 2015-07-27 — End: 2015-07-27
  Filled 2015-07-27: qty 1

## 2015-07-27 MED ORDER — ACETAMINOPHEN 325 MG PO TABS: 650.0000 mg | ORAL_TABLET | Freq: Four times a day (QID) | ORAL | Status: DC | PRN

## 2015-07-27 MED ORDER — LIDOCAINE HCL (CARDIAC) 20 MG/ML IV SOLN
INTRAVENOUS | Status: DC | PRN
  Administered 2015-07-27: 50 mg via INTRAVENOUS

## 2015-07-27 MED ORDER — CHLORHEXIDINE GLUCONATE 4 % EX LIQD: 60.0000 mL | Freq: Once | CUTANEOUS | Status: DC

## 2015-07-27 MED ORDER — ONDANSETRON HCL 4 MG/2ML IJ SOLN
4.0000 mg | Freq: Four times a day (QID) | INTRAMUSCULAR | Status: DC | PRN
  Filled 2015-07-27 (×2): qty 2

## 2015-07-27 MED ORDER — 0.9 % SODIUM CHLORIDE (POUR BTL) OPTIME
TOPICAL | Status: DC | PRN
  Administered 2015-07-27: 200 mL

## 2015-07-27 MED ORDER — SODIUM CHLORIDE 0.9 % IV SOLN
INTRAVENOUS | Status: DC
  Administered 2015-07-27: 16:00:00 via INTRAVENOUS

## 2015-07-27 MED ORDER — LACTATED RINGERS IV SOLN
INTRAVENOUS | Status: DC
  Administered 2015-07-27 (×2): via INTRAVENOUS

## 2015-07-27 MED ORDER — SCOPOLAMINE 1 MG/3DAYS TD PT72: 1.0000 | MEDICATED_PATCH | Freq: Once | TRANSDERMAL | Status: DC | PRN

## 2015-07-27 MED ORDER — CEFAZOLIN SODIUM-DEXTROSE 2-3 GM-% IV SOLR
2.0000 g | INTRAVENOUS | Status: AC
  Administered 2015-07-27: 2 g via INTRAVENOUS

## 2015-07-27 MED ORDER — VANCOMYCIN HCL 500 MG IV SOLR
INTRAVENOUS | Status: DC | PRN
  Administered 2015-07-27: 500 mg via TOPICAL

## 2015-07-27 SURGICAL SUPPLY — 88 items
BANDAGE ESMARK 6X9 LF (GAUZE/BANDAGES/DRESSINGS) ×2 IMPLANT
BLADE AVERAGE 25MMX9MM (BLADE)
BLADE AVERAGE 25X9 (BLADE) IMPLANT
BLADE CCA MICRO SAG (BLADE) IMPLANT
BLADE MICRO SAGITTAL (BLADE) ×4 IMPLANT
BLADE SURG 15 STRL LF DISP TIS (BLADE) ×4 IMPLANT
BLADE SURG 15 STRL SS (BLADE) ×12
BNDG CMPR 9X6 STRL LF SNTH (GAUZE/BANDAGES/DRESSINGS) ×2
BNDG COHESIVE 4X5 TAN STRL (GAUZE/BANDAGES/DRESSINGS) ×4 IMPLANT
BNDG COHESIVE 6X5 TAN STRL LF (GAUZE/BANDAGES/DRESSINGS) ×4 IMPLANT
BNDG ESMARK 6X9 LF (GAUZE/BANDAGES/DRESSINGS) ×4
BUR EGG 3PK/BX (BURR) IMPLANT
CANISTER SUCT 1200ML W/VALVE (MISCELLANEOUS) ×4 IMPLANT
CHLORAPREP W/TINT 26ML (MISCELLANEOUS) ×4 IMPLANT
COVER BACK TABLE 60X90IN (DRAPES) ×4 IMPLANT
CUFF TOURNIQUET SINGLE 34IN LL (TOURNIQUET CUFF) ×4 IMPLANT
DRAPE C-ARM 42X72 X-RAY (DRAPES) IMPLANT
DRAPE C-ARMOR (DRAPES) IMPLANT
DRAPE EXTREMITY T 121X128X90 (DRAPE) ×4 IMPLANT
DRAPE OEC MINIVIEW 54X84 (DRAPES) ×4 IMPLANT
DRAPE U-SHAPE 47X51 STRL (DRAPES) ×4 IMPLANT
DRSG MEPITEL 4X7.2 (GAUZE/BANDAGES/DRESSINGS) ×4 IMPLANT
DRSG PAD ABDOMINAL 8X10 ST (GAUZE/BANDAGES/DRESSINGS) ×8 IMPLANT
ELECT REM PT RETURN 9FT ADLT (ELECTROSURGICAL) ×4
ELECTRODE REM PT RTRN 9FT ADLT (ELECTROSURGICAL) ×2 IMPLANT
GAUZE SPONGE 4X4 12PLY STRL (GAUZE/BANDAGES/DRESSINGS) ×4 IMPLANT
GLOVE BIO SURGEON STRL SZ8 (GLOVE) ×4 IMPLANT
GLOVE BIOGEL PI IND STRL 7.0 (GLOVE) IMPLANT
GLOVE BIOGEL PI IND STRL 8 (GLOVE) ×4 IMPLANT
GLOVE BIOGEL PI INDICATOR 7.0 (GLOVE) ×2
GLOVE BIOGEL PI INDICATOR 8 (GLOVE) ×4
GLOVE ECLIPSE 6.5 STRL STRAW (GLOVE) ×2 IMPLANT
GLOVE ECLIPSE 7.5 STRL STRAW (GLOVE) ×4 IMPLANT
GLOVE EXAM NITRILE MD LF STRL (GLOVE) ×2 IMPLANT
GOWN STRL REUS W/ TWL LRG LVL3 (GOWN DISPOSABLE) ×2 IMPLANT
GOWN STRL REUS W/ TWL XL LVL3 (GOWN DISPOSABLE) ×4 IMPLANT
GOWN STRL REUS W/TWL LRG LVL3 (GOWN DISPOSABLE) ×4
GOWN STRL REUS W/TWL XL LVL3 (GOWN DISPOSABLE) ×8
GUIDEWIRE 1.1X6IN (WIRE) ×2 IMPLANT
NDL HYPO 25X1 1.5 SAFETY (NEEDLE) IMPLANT
NDL SUT 6 .5 CRC .975X.05 MAYO (NEEDLE) IMPLANT
NEEDLE HYPO 22GX1.5 SAFETY (NEEDLE) IMPLANT
NEEDLE HYPO 25X1 1.5 SAFETY (NEEDLE) IMPLANT
NEEDLE MAYO TAPER (NEEDLE)
NS IRRIG 1000ML POUR BTL (IV SOLUTION) ×4 IMPLANT
PACK BASIN DAY SURGERY FS (CUSTOM PROCEDURE TRAY) ×4 IMPLANT
PAD CAST 4YDX4 CTTN HI CHSV (CAST SUPPLIES) ×2 IMPLANT
PADDING CAST ABS 4INX4YD NS (CAST SUPPLIES)
PADDING CAST ABS COTTON 4X4 ST (CAST SUPPLIES) IMPLANT
PADDING CAST COTTON 4X4 STRL (CAST SUPPLIES) ×4
PADDING CAST COTTON 6X4 STRL (CAST SUPPLIES) ×4 IMPLANT
PASSER SUT SWANSON 36MM LOOP (INSTRUMENTS) ×2 IMPLANT
PENCIL BUTTON HOLSTER BLD 10FT (ELECTRODE) ×4 IMPLANT
PIN GUIDE DRILL TIP 2.8X300 (DRILL) ×4 IMPLANT
SANITIZER HAND PURELL 535ML FO (MISCELLANEOUS) ×4 IMPLANT
SCREW CANN RATTLER 5X20 (Screw) ×2 IMPLANT
SCREW CANNULATED 6.5X50 (Screw) ×2 IMPLANT
SCREW CANNULATED 6.5X55 KNEE (Screw) ×2 IMPLANT
SHEET MEDIUM DRAPE 40X70 STRL (DRAPES) ×4 IMPLANT
SLEEVE SCD COMPRESS KNEE MED (MISCELLANEOUS) ×4 IMPLANT
SPLINT FAST PLASTER 5X30 (CAST SUPPLIES) ×40
SPLINT PLASTER CAST FAST 5X30 (CAST SUPPLIES) ×40 IMPLANT
SPONGE LAP 18X18 X RAY DECT (DISPOSABLE) ×4 IMPLANT
SPONGE SURGIFOAM ABS GEL 12-7 (HEMOSTASIS) IMPLANT
STOCKINETTE 6  STRL (DRAPES) ×2
STOCKINETTE 6 STRL (DRAPES) ×2 IMPLANT
SUCTION FRAZIER HANDLE 10FR (MISCELLANEOUS) ×2
SUCTION TUBE FRAZIER 10FR DISP (MISCELLANEOUS) ×2 IMPLANT
SUT 2 FIBERLOOP 20 STRT BLUE (SUTURE)
SUT BONE WAX W31G (SUTURE) IMPLANT
SUT ETHIBOND 2 OS 4 DA (SUTURE) IMPLANT
SUT ETHILON 3 0 PS 1 (SUTURE) ×6 IMPLANT
SUT FIBERWIRE #2 38 T-5 BLUE (SUTURE)
SUT FIBERWIRE 2-0 18 17.9 3/8 (SUTURE)
SUT MNCRL AB 3-0 PS2 18 (SUTURE) ×6 IMPLANT
SUT VIC AB 0 SH 27 (SUTURE) ×4 IMPLANT
SUT VIC AB 2-0 SH 27 (SUTURE)
SUT VIC AB 2-0 SH 27XBRD (SUTURE) IMPLANT
SUTURE 2 FIBERLOOP 20 STRT BLU (SUTURE) IMPLANT
SUTURE FIBERWR #2 38 T-5 BLUE (SUTURE) IMPLANT
SUTURE FIBERWR 2-0 18 17.9 3/8 (SUTURE) IMPLANT
SYR BULB 3OZ (MISCELLANEOUS) ×4 IMPLANT
SYR CONTROL 10ML LL (SYRINGE) IMPLANT
TOWEL OR 17X24 6PK STRL BLUE (TOWEL DISPOSABLE) ×8 IMPLANT
TUBE CONNECTING 20'X1/4 (TUBING) ×1
TUBE CONNECTING 20X1/4 (TUBING) ×3 IMPLANT
UNDERPAD 30X30 (UNDERPADS AND DIAPERS) ×4 IMPLANT
YANKAUER SUCT BULB TIP NO VENT (SUCTIONS) IMPLANT

## 2015-07-27 NOTE — Brief Op Note (Signed)
07/27/2015  1:17 PM  PATIENT:  [name] [name]  70 y.o. female  PRE-OPERATIVE DIAGNOSIS: 1.  Right posterior tibial tendonitis      2.  Right flatfoot deformity      3.  Right tight heelcord      4.  Right chronic plantar fasciitis  POST-OPERATIVE DIAGNOSIS:  same  Procedure(s): 1.  RIGHT GASTROC RECESSION (separate incision) 2.  RIGHT CALCANEAL OSTEOTOMY (separate incision) 3.  RIGHT FLEXOR DIGITORUM LONGUS TRANSFER TO NAVICULAR 4.  Right posterior tibial tendon tenolysis 5.  Right plantar fascia release (separate incision) 6.  Right foot AP, lateral and Harris heel radiographs   SURGEON:  Wylene Simmer, MD  ASSISTANT: Mechele Claude, PA-C  ANESTHESIA:   General, regional  EBL:  minimal   TOURNIQUET:   Total Tourniquet Time Documented: Thigh (Right) - 62 minutes Total: Thigh (Right) - 62 minutes  COMPLICATIONS:  None apparent  DISPOSITION:  Extubated, awake and stable to recovery.  DICTATION ID:  [id number]

## 2015-07-27 NOTE — Discharge Instructions (Addendum)
[name] Simmer, MD [facility]  Please read the following information regarding your care after surgery.  Medications  You only need a prescription for the narcotic pain medicine (ex. oxycodone, Percocet, Norco).  All of the other medicines listed below are available over the counter. X acetominophen (Tylenol) 650 mg every 4-6 hours as you need for minor pain X oxycodone as prescribed for moderate to severe pain ?   Narcotic pain medicine (ex. oxycodone, Percocet, Vicodin) will cause constipation.  To prevent this problem, take the following medicines while you are taking any pain medicine. X docusate sodium (Colace) 100 mg twice a day X senna (Senokot) 2 tablets twice a day  X To help prevent blood clots, take an aspirin (325 mg) once a day for a month after surgery.  You should also get up every hour while you are awake to move around.    Weight Bearing X Do not bear any weight on the operated leg or foot.  Cast / Splint / Dressing X Keep your splint or cast clean and dry.  Dont put anything (coat hanger, pencil, etc) down inside of it.  If it gets damp, use a hair dryer on the cool setting to dry it.  If it gets soaked, call the office to schedule an appointment for a cast change.   After your dressing, cast or splint is removed; you may shower, but do not soak or scrub the wound.  Allow the water to run over it, and then gently pat it dry.  Swelling It is normal for you to have swelling where you had surgery.  To reduce swelling and pain, keep your toes above your nose for at least 3 days after surgery.  It may be necessary to keep your foot or leg elevated for several weeks.  If it hurts, it should be elevated.  Follow Up Call my office at [phone] when you are discharged from the hospital or surgery center to schedule an appointment to be seen two weeks after surgery.  Call my office at [phone] if you develop a fever >101.5 F, nausea, vomiting, bleeding from  the surgical site or severe pain.    Regional Anesthesia Blocks  1. Numbness or the inability to move the "blocked" extremity may last from 3-48 hours after placement. The length of time depends on the medication injected and your individual response to the medication. If the numbness is not going away after 48 hours, call your surgeon.  2. The extremity that is blocked will need to be protected until the numbness is gone and the  Strength has returned. Because you cannot feel it, you will need to take extra care to avoid injury. Because it may be weak, you may have difficulty moving it or using it. You may not know what position it is in without looking at it while the block is in effect.  3. For blocks in the legs and feet, returning to weight bearing and walking needs to be done carefully. You will need to wait until the numbness is entirely gone and the strength has returned. You should be able to move your leg and foot normally before you try and bear weight or walk. You will need someone to be with you when you first try to ensure you do not fall and possibly risk injury.  4. Bruising and tenderness at the needle site are common side effects and will resolve in a few days.  5. Persistent numbness or new problems with movement  should be communicated to the surgeon or the [facility] [phone] [place] [phone]).  Post Anesthesia Home Care Instructions  Activity: Get plenty of rest for the remainder of the day. A responsible adult should stay with you for 24 hours following the procedure.  For the next 24 hours, DO NOT: -Drive a car -Paediatric nurse -Drink alcoholic beverages -Take any medication unless instructed by your physician -Make any legal decisions or sign important papers.  Meals: Start with liquid foods such as gelatin or soup. Progress to regular foods as tolerated. Avoid greasy, spicy, heavy foods. If nausea and/or vomiting occur,  drink only clear liquids until the nausea and/or vomiting subsides. Call your physician if vomiting continues.  Special Instructions/Symptoms: Your throat may feel dry or sore from the anesthesia or the breathing tube placed in your throat during surgery. If this causes discomfort, gargle with warm salt water. The discomfort should disappear within 24 hours.  If you had a scopolamine patch placed behind your ear for the management of post- operative nausea and/or vomiting:  1. The medication in the patch is effective for 72 hours, after which it should be removed.  Wrap patch in a tissue and discard in the trash. Wash hands thoroughly with soap and water. 2. You may remove the patch earlier than 72 hours if you experience unpleasant side effects which may include dry mouth, dizziness or visual disturbances. 3. Avoid touching the patch. Wash your hands with soap and water after contact with the patch.

## 2015-07-27 NOTE — Progress Notes (Signed)
Assisted Dr. Marcie Bal with right, ultrasound guided, popliteal block. Side rails up, monitors on throughout procedure. See vital signs in flow sheet. Tolerated Procedure well.

## 2015-07-27 NOTE — Anesthesia Procedure Notes (Addendum)
Procedure Name: LMA Insertion Performed by: BLOCKER, TIMOTHY D Pre-anesthesia Checklist: Patient identified, Emergency Drugs available, Suction available and Patient being monitored Patient Re-evaluated:Patient Re-evaluated prior to inductionOxygen Delivery Method: Circle System Utilized Preoxygenation: Pre-oxygenation with 100% oxygen Intubation Type: IV induction Ventilation: Mask ventilation without difficulty LMA: LMA inserted LMA Size: 4.0 Number of attempts: 1 Airway Equipment and Method: bite block Placement Confirmation: positive ETCO2 Tube secured with: Tape Dental Injury: Teeth and Oropharynx as per pre-operative assessment    Anesthesia Regional Block:  Popliteal block  Pre-Anesthetic Checklist: ,, timeout performed, Correct Patient, Correct Site, Correct Laterality, Correct Procedure, Correct Position, site marked, Risks and benefits discussed,  Surgical consent,  Pre-op evaluation,  At surgeon's request and post-op pain management  Laterality: Right  Prep: chloraprep       Needles:  Injection technique: Single-shot  Needle Type: Echogenic Stimulator Needle     Needle Length: 9cm 9 cm Needle Gauge: 21 and 21 G    Additional Needles:  Procedures: ultrasound guided (picture in chart) and nerve stimulator Popliteal block  Nerve Stimulator or Paresthesia:  Response: plantar flexion of foot, 0.45 mA,   Additional Responses:   Narrative:  Start time: 07/27/2015 11:11 AM End time: 07/27/2015 11:20 AM Injection made incrementally with aspirations every 5 mL.  Performed by: Personally  Anesthesiologist: Aylyn Wenzler  Additional Notes: Functioning IV was confirmed and monitors were applied.  A 72m 21ga Arrow echogenic stimulator needle was used. Sterile prep and drape,hand hygiene and sterile gloves were used.  Negative aspiration and negative test dose prior to incremental administration of local anesthetic. The patient tolerated the procedure well.  Ultrasound  guidance: relevent anatomy identified, needle position confirmed, local anesthetic spread visualized around nerve(s), vascular puncture avoided.  Image printed for medical record.

## 2015-07-27 NOTE — Op Note (Signed)
NAMEVERLEEN, STUCKEY             ACCOUNT NO.:  [id number]  MEDICAL RECORD NO.:  [id number]  LOCATION:                                 FACILITY:  PHYSICIAN:  Wylene Simmer, MD             DATE OF BIRTH:  DATE OF PROCEDURE:  07/27/2015 DATE OF DISCHARGE:                              OPERATIVE REPORT   PREOPERATIVE DIAGNOSES: 1. Right posterior tibial tendinitis. 2. Right flatfoot deformity. 3. Right tight heel cord. 4. Right chronic plantar fasciitis.  POSTOPERATIVE DIAGNOSES: 1. Right posterior tibial tendinitis. 2. Right flatfoot deformity. 3. Right tight heel cord. 4. Right chronic plantar fasciitis.  PROCEDURE: 1. Right gastrocnemius recession through a separate incision. 2. Right calcaneal medializing osteotomy through a separate incision. 3. Right foot flexor digitorum longus transfer to the navicular. 4. Right posterior tibial tendon tenolysis. 5. Right plantar fascia release through a separate incision. 6. Right foot AP, lateral, and Harris heel radiographs.  SURGEON:  Wylene Simmer, MD.  ASSISTANT:  Mechele Claude, PA-C.  ANESTHESIA:  General, regional.  ESTIMATED BLOOD LOSS:  Minimal.  TOURNIQUET TIME:  62 minutes at 250 mmHg.  COMPLICATIONS:  None apparent.  DISPOSITION:  Extubated, awake, and stable to recovery.  INDICATIONS FOR PROCEDURE:  The patient is a 70 year old woman with past medical history significant for diabetes.  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  She also has chronic plantar fasciitis.  She has failed nonoperative treatment to date and presents today for surgical treatment.  She understands the risks and benefits, the alternative treatment options, and elects surgical treatment.  She specifically understands the risks of bleeding, infection, nerve damage, blood clots, need for additional surgery, continued pain, recurrence of deformity, amputation, and  death.  PROCEDURE IN DETAIL:  After preoperative consent was obtained and the correct operative site was identified, the patient was brought to the operating room and placed supine on the operating table.  General anesthesia was induced.  Preoperative antibiotics were administered. Surgical time-out was taken.  The right lower extremity was prepped and draped in standard sterile fashion with a tourniquet around the thigh. The extremity was exsanguinated and the tourniquet was inflated to 250 mmHg.  A longitudinal incision was then made over the medial calf. Sharp dissection was carried down through the skin and subcutaneous tissue.  Superficial fascia was incised.  The plantaris tendon was identified and was transected.  The gastrocnemius tendon was then identified and was transected from medial to lateral under direct vision.  The ankle was then dorsiflexed to 30 degrees past neutral with the knee extended.  The wound was irrigated and closed with Monocryl and nylon.  Attention was then turned to the lateral aspect of the hindfoot where an oblique incision was made over the lateral wall of the calcaneus.  Sharp dissection was carried down through the skin and subcutaneous tissue.  A medializing calcaneal osteotomy was then made with an oscillating saw protecting the soft tissues dorsally and plantarly.  The tuberosity was translated medially and two 6.5 mm partially threaded cannulated screws were inserted to hold the osteotomy appropriately.  Lateral and Harris heel radiographs showed appropriate position and length of the hardware and appropriate translation of the calcaneus.  Lateral wall was then impacted to smooth the cut surface.  The wound was irrigated and closed with nylon after vancomycin powder was sprinkled in the wound.  Attention was then turned to the medial aspect of the hindfoot where a curvilinear incision was made along the course of posterior tibial tendon.  Sharp  dissection was carried down through the skin and subcutaneous tissue.  The tendon sheath was incised and released proximally.  Tenolysis was then carried along the posterior tibial tendon removing all synovitis and superficial tears.  Spring ligament was inspected and was noted to be healthy and intact.  The flexor digitorum longus tendon was then identified.  It was traced down into the midfoot and released proximal to the knot of Mallie Mussel.  It was whip stitched with Vicryl.  A 5-mm hole was then drilled in the navicular and the tendon was pulled out through that hole.  It was secured with a 5 mm x 20 mm Rattler interference screw from [employer].  The suture ends were then repaired in subperiosteal fashion to the navicular.  Wound was irrigated copiously.  The deep layers were closed with Vicryl, superficial layers were closed with Monocryl, the skin was closed with nylon after sprinkling vancomycin powder within the wound.  Attention was then turned down to the plantar aspect of the foot where a short incision was made just medial to the plantar fascia.  Blunt dissection was carried down through subcutaneous tissues.  The plantar fascia was then dissected superficially and deeply.  It was transected over its medial two-thirds under direct vision with a scalpel.  Wound was irrigated and closed with nylon.  Sterile dressings were applied followed by well-padded short-leg splint.  Tourniquet was released after application of the dressings at 62 minutes.  The patient was awakened from anesthesia and transported to the recovery room in stable condition.  FOLLOWUP PLAN:  The patient will be observed overnight for pain control. She will start on aspirin 325 mg daily for DVT prophylaxis.  She will follow up with me in the office in 2 weeks for suture removal and conversion to a short-leg cast.  RADIOGRAPHS:  Lateral Harris heel and AP radiographs of the right foot were obtained intraoperatively.   These show interval calcaneal osteotomy and FDL transfer to the navicular.  Hardware was appropriately positioned and of the appropriate length.  No other acute injuries were noted.  Mechele Claude, PA-C was present and scrubbed for the duration of the case.  His assistance was essential in positioning the patient, prepping and draping, gaining and maintaining exposure, performing the operation, closing and dressing the wounds, and applying the splint.     Wylene Simmer, MD     JH/MEDQ  D:  07/27/2015  T:  07/27/2015  Job:  [id number]

## 2015-07-27 NOTE — Anesthesia Postprocedure Evaluation (Signed)
Anesthesia Post Note  Patient: SAHALIE BETH  Procedure(s) Performed: Procedure(s) (LRB): RIGHT GASTROC RECESSION (Right) RIGHT CALCANEAL OSTEOTOMY  (Right) RIGHT FLEXOR DIGITORUM LONGUS TRANSFER TO NAVICULAR; POSTERIOR TIBIAL TENODESIS  (Right)  Patient location during evaluation: PACU Anesthesia Type: General Level of consciousness: awake and alert and patient cooperative Pain management: pain level controlled Vital Signs Assessment: post-procedure vital signs reviewed and stable Respiratory status: spontaneous breathing and respiratory function stable Cardiovascular status: stable Anesthetic complications: no    Last Vitals:  Filed Vitals:   07/27/15 1345 07/27/15 1400  BP: 145/71 145/71  Pulse: 77 74  Temp:    Resp: 6 10    Last Pain:  Filed Vitals:   07/27/15 1408  PainSc: 5                  Quantia Grullon S

## 2015-07-27 NOTE — Transfer of Care (Signed)
Immediate Anesthesia Transfer of Care Note  Patient: [name] [name]  Procedure(s) Performed: Procedure(s): RIGHT GASTROC RECESSION (Right) RIGHT CALCANEAL OSTEOTOMY  (Right) RIGHT FLEXOR DIGITORUM LONGUS TRANSFER TO NAVICULAR; POSTERIOR TIBIAL TENODESIS  (Right)  Patient Location: PACU  Anesthesia Type:GA combined with regional for post-op pain  Level of Consciousness: awake, alert , oriented and patient cooperative  Airway & Oxygen Therapy: Patient Spontanous Breathing and Patient connected to face mask oxygen  Post-op Assessment: Report given to RN and Post -op Vital signs reviewed and stable  Post vital signs: Reviewed and stable  Last Vitals:  Filed Vitals:   07/27/15 1100 07/27/15 1105  BP: 129/62   Pulse: 64 65  Temp:    Resp: 14 16    Complications: No apparent anesthesia complications

## 2015-07-27 NOTE — Anesthesia Preprocedure Evaluation (Addendum)
Anesthesia Evaluation  Patient identified by MRN, date of birth, ID band Patient awake    Reviewed: Allergy & Precautions, NPO status , Patient's Chart, lab work & pertinent test results  Airway Mallampati: II   Neck ROM: full    Dental   Pulmonary sleep apnea ,    breath sounds clear to auscultation       Cardiovascular + dysrhythmias  Rhythm:regular Rate:Normal     Neuro/Psych PSYCHIATRIC DISORDERS Depression  Neuromuscular disease    GI/Hepatic GERD  Medicated,  Endo/Other  diabetes, Type 2, Insulin DependentMorbid obesity  Renal/GU      Musculoskeletal  (+) Arthritis ,   Abdominal   Peds  Hematology   Anesthesia Other Findings Pt had a glass of milk at 5:30 this morning.  Reproductive/Obstetrics                            Anesthesia Physical Anesthesia Plan  ASA: II  Anesthesia Plan: General and Regional   Post-op Pain Management:    Induction: Intravenous  Airway Management Planned: LMA  Additional Equipment:   Intra-op Plan:   Post-operative Plan:   Informed Consent: I have reviewed the patients History and Physical, chart, labs and discussed the procedure including the risks, benefits and alternatives for the proposed anesthesia with the patient or authorized representative who has indicated his/her understanding and acceptance.     Plan Discussed with: CRNA, Anesthesiologist and Surgeon  Anesthesia Plan Comments:         Anesthesia Quick Evaluation

## 2015-07-27 NOTE — H&P (Signed)
[name] [name] is an 70 y.o. female.   Chief Complaint: right foot pain HPI: 70 y/o female with PMH of diabetes c/o chronic pain in her right foot.  She has a flat foot deformity with stage 2 PTTD and a tight heelcord.  She presents now for gastroc recession, calc osteotomy, FDL transfer to the navicular and possible plantar fascia release.  Past Medical History  Diagnosis Date  . Acute cystitis   . Acute pharyngitis   . Other diseases of lung, not elsewhere classified   . Cyst and pseudocyst of pancreas   . Polyneuropathy in diabetes(357.2)   . Pain in joint, lower leg   . Type II or unspecified type diabetes mellitus with neurological manifestations, not stated as uncontrolled   . Edema   . Dysuria   . Unspecified disorder of liver   . Periapical abscess without sinus   . Depressive disorder, not elsewhere classified   . Benign neoplasm of colon   . Obesity, unspecified   . Pure hypercholesterolemia   . Unspecified urinary incontinence   . Other specified cardiac dysrhythmias(427.89)   . Irritable bowel syndrome   . Diverticulosis of colon (without mention of hemorrhage)   . Dysthymic disorder   . Essential and other specified forms of tremor   . Esophageal reflux   . Allergic rhinitis, cause unspecified   . Type II or unspecified type diabetes mellitus without mention of complication, not stated as uncontrolled   . Abnormal abdominal CT scan 04/2006    slight abn liver  . Abnormal CT scan, pancreas or bile duct 04/2006    cyst  . Abnormal chest CT 10/07    unable to bx  . Diverticulitis   . Unspecified sleep apnea   . Sleep apnea     CPAP- non compliant  . Arthritis     hands, feet and back    Past Surgical History  Procedure Laterality Date  . Rotator cuff repair  2005  . Kidney stone surgery  1980's    removal  . Partial hysterectomy  1990's  . 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 Cholecystectomy      15-20 years ago  . Abdominal hysterectomy       Family History  Problem Relation Age of Onset  . Cirrhosis Father   . Diabetes      2 aunts  . Clotting disorder Mother   . Cancer      grandmother  . Allergies      "whole family"  . Asthma      "whole family"   Social History:  reports that she has never smoked. She has never used smokeless tobacco. She reports that she does not drink alcohol or use illicit drugs.  Allergies:  Allergies  Allergen Reactions  . Doxycycline Hives  . Epinephrine Hcl     REACTION: Had tremors when it was given.  . Erythromycin Ethylsuccinate     REACTION: abd spasms  . Iohexol      Code: HIVES, Desc: pt needs premedicated-- hives on prev contrast study per md office     Medications Prior to Admission  Medication Sig Dispense Refill  . aspirin 81 MG tablet Take 81 mg by mouth daily.      . Calcium Carbonate-Vitamin D (CALCIUM + D PO) Take 1,200 mg by mouth 2 (two) times daily.     . fluticasone (FLONASE) 50 MCG/ACT nasal spray 1-2 puffs each nostril, once or twice daily  for allergy 16 g prn  . furosemide (LASIX) 20 MG tablet TAKE 1 TABLET EVERY DAY 90 tablet 0  . metoprolol (LOPRESSOR) 50 MG tablet TAKE 1 TABLET BY MOUTH TWICE A DAY 180 tablet 3  . NEXIUM 40 MG capsule TAKE 1 CAPSULE BY MOUTH DAILY 90 capsule 1  . nitrofurantoin (MACRODANTIN) 100 MG capsule START WITH 1 CAPSULE TWO TIMES A DAY FOR UTI THEN CHANGE TO 1 CAPSULE DAILY FOR PREVENTION 30 capsule 10  . NOVOLIN 70/30 RELION (70-30) 100 UNIT/ML injection Inject 70 Units into the skin 2 (two) times daily.    Marland Kitchen omeprazole (PRILOSEC OTC) 20 MG tablet Take 20 mg by mouth daily as needed.     . temazepam (RESTORIL) 15 MG capsule Take 1-2 capsules (15-30 mg total) by mouth at bedtime as needed. 30 capsule 5  . Insulin Pen Needle (B-D ULTRAFINE III SHORT PEN) 31G X 8 MM MISC Use to inject insulin two times a day.  Dx: E11.9 100 each 0  . ONE TOUCH ULTRA TEST test strip USE 2 TIMES A DAY (DIAG CODE 250.00) 100 each 11  . ONETOUCH DELICA  LANCETS FINE MISC   6    Results for orders placed or performed during the hospital encounter of 08-22-2015 (from the past 48 hour(s))  Basic metabolic panel     Status: Abnormal   Collection Time: 07/26/15  2:00 PM  Result Value Ref Range   Sodium 138 135 - 145 mmol/L   Potassium 4.7 3.5 - 5.1 mmol/L   Chloride 104 101 - 111 mmol/L   CO2 25 22 - 32 mmol/L   Glucose, Bld 308 (H) 65 - 99 mg/dL   BUN 17 6 - 20 mg/dL   Creatinine, Ser 0.82 0.44 - 1.00 mg/dL   Calcium 9.7 8.9 - 10.3 mg/dL   GFR calc non Af Amer >60 >60 mL/min   GFR calc Af Amer >60 >60 mL/min    Comment: (NOTE) The eGFR has been calculated using the CKD EPI equation. This calculation has not been validated in all clinical situations. eGFR's persistently <60 mL/min signify possible Chronic Kidney Disease.    Anion gap 9 5 - 15  Glucose, capillary     Status: None   Collection Time: 2015-08-22  8:54 AM  Result Value Ref Range   Glucose-Capillary 87 65 - 99 mg/dL   No results found.  ROS  No recent f/c/n/v/wt loss  Blood pressure 146/51, pulse 65, temperature 98.2 F (36.8 C), temperature source Oral, height 5' 5"  (1.651 m), weight 94.802 kg (209 lb), SpO2 98 %. Physical Exam  wn wd woman in nad.  A and o x 4.  Mood and affect normal.  EOMI.  resp unlabored.  Right foot with pes planus and a tight heelcord.  Skin healthy and intact.  No lymphadenopathy.  5/5 strength in PF.  4/5 in inversion.  Sens to LT intact at the forefoot.  Assessment/Plan R flatfoot deformity, tightheelcord, chronic plantar fasciitis and posterior tibial tendonitis - to OR for surgical correction.  The risks and benefits of the alternative treatment options have been discussed in detail.  The patient wishes to proceed with surgery and specifically understands risks of bleeding, infection, nerve damage, blood clots, need for additional surgery, amputation and death.   Wylene Simmer 08-22-15, 9:34 AM

## 2015-07-28 DIAGNOSIS — E1142 Type 2 diabetes mellitus with diabetic polyneuropathy: Secondary | ICD-10-CM | POA: Diagnosis not present

## 2015-07-28 DIAGNOSIS — M76821 Posterior tibial tendinitis, right leg: Secondary | ICD-10-CM | POA: Diagnosis not present

## 2015-07-28 DIAGNOSIS — M2141 Flat foot [pes planus] (acquired), right foot: Secondary | ICD-10-CM | POA: Diagnosis not present

## 2015-07-28 DIAGNOSIS — M722 Plantar fascial fibromatosis: Secondary | ICD-10-CM | POA: Diagnosis not present

## 2015-07-28 DIAGNOSIS — M62471 Contracture of muscle, right ankle and foot: Secondary | ICD-10-CM | POA: Diagnosis not present

## 2015-07-28 LAB — GLUCOSE, CAPILLARY: Glucose-Capillary: 262 mg/dL — ABNORMAL HIGH (ref 65–99)

## 2015-07-30 ENCOUNTER — Encounter (HOSPITAL_BASED_OUTPATIENT_CLINIC_OR_DEPARTMENT_OTHER): Payer: Self-pay | Admitting: Orthopedic Surgery

## 2015-08-01 ENCOUNTER — Ambulatory Visit: Payer: Medicare Other | Admitting: Internal Medicine

## 2015-08-05 ENCOUNTER — Encounter (HOSPITAL_COMMUNITY): Payer: Self-pay

## 2015-08-05 ENCOUNTER — Emergency Department (HOSPITAL_COMMUNITY)
Admission: EM | Admit: 2015-08-05 | Discharge: 2015-08-05 | Disposition: A | Discharge status: Home / Self Care | Payer: Medicare Other | Attending: Emergency Medicine | Admitting: Emergency Medicine

## 2015-08-05 DIAGNOSIS — Z4689 Encounter for fitting and adjustment of other specified devices: Secondary | ICD-10-CM | POA: Insufficient documentation

## 2015-08-05 DIAGNOSIS — Z86018 Personal history of other benign neoplasm: Secondary | ICD-10-CM | POA: Insufficient documentation

## 2015-08-05 DIAGNOSIS — Z7982 Long term (current) use of aspirin: Secondary | ICD-10-CM | POA: Insufficient documentation

## 2015-08-05 DIAGNOSIS — E669 Obesity, unspecified: Secondary | ICD-10-CM | POA: Diagnosis not present

## 2015-08-05 DIAGNOSIS — M79661 Pain in right lower leg: Secondary | ICD-10-CM | POA: Diagnosis present

## 2015-08-05 DIAGNOSIS — Z794 Long term (current) use of insulin: Secondary | ICD-10-CM | POA: Diagnosis not present

## 2015-08-05 DIAGNOSIS — Z7951 Long term (current) use of inhaled steroids: Secondary | ICD-10-CM | POA: Diagnosis not present

## 2015-08-05 DIAGNOSIS — Z4789 Encounter for other orthopedic aftercare: Secondary | ICD-10-CM

## 2015-08-05 DIAGNOSIS — Z8679 Personal history of other diseases of the circulatory system: Secondary | ICD-10-CM | POA: Insufficient documentation

## 2015-08-05 DIAGNOSIS — Z87448 Personal history of other diseases of urinary system: Secondary | ICD-10-CM | POA: Insufficient documentation

## 2015-08-05 DIAGNOSIS — F329 Major depressive disorder, single episode, unspecified: Secondary | ICD-10-CM | POA: Diagnosis not present

## 2015-08-05 DIAGNOSIS — Z8669 Personal history of other diseases of the nervous system and sense organs: Secondary | ICD-10-CM | POA: Diagnosis not present

## 2015-08-05 DIAGNOSIS — Z8709 Personal history of other diseases of the respiratory system: Secondary | ICD-10-CM | POA: Insufficient documentation

## 2015-08-05 DIAGNOSIS — M1990 Unspecified osteoarthritis, unspecified site: Secondary | ICD-10-CM | POA: Insufficient documentation

## 2015-08-05 DIAGNOSIS — K219 Gastro-esophageal reflux disease without esophagitis: Secondary | ICD-10-CM | POA: Insufficient documentation

## 2015-08-05 DIAGNOSIS — E1142 Type 2 diabetes mellitus with diabetic polyneuropathy: Secondary | ICD-10-CM | POA: Insufficient documentation

## 2015-08-05 DIAGNOSIS — Z791 Long term (current) use of non-steroidal anti-inflammatories (NSAID): Secondary | ICD-10-CM | POA: Diagnosis not present

## 2015-08-05 DIAGNOSIS — Z79899 Other long term (current) drug therapy: Secondary | ICD-10-CM | POA: Insufficient documentation

## 2015-08-05 NOTE — Consult Note (Signed)
Reason for Consult: Wet Splint Referring Physician: Emergency Department  JACOBY RITSEMA is an 70 y.o. female.  HPI: [name] is a 70 year old female, with PMH stated below, that presented to the ED on 08/05/15 after getting her splint wet at home.  She had R gastroc recession, calcaneal osteotomy, FDL transfer to navicular, and posterior tibialis tenolysis by Dr. Wylene Simmer on 07/27/15.  She notes that she was icing at home when her ice bag leaked and soaked her splint.  She called the [place] weekend number, and she was then instructed to go the [facility] ED for evaluation.  Dr. Wylene Simmer was then consulted for eval.  The patient denies fever, chills, N/V, or any new injury or insult to her operative extremity.  She states that she has been compliant maintaining her NWB status.  Patient reports that she is pain free today.  Past Medical History  Diagnosis Date  . Acute cystitis   . Acute pharyngitis   . Other diseases of lung, not elsewhere classified   . Cyst and pseudocyst of pancreas   . Polyneuropathy in diabetes(357.2)   . Pain in joint, lower leg   . Type II or unspecified type diabetes mellitus with neurological manifestations, not stated as uncontrolled   . Edema   . Dysuria   . Unspecified disorder of liver   . Periapical abscess without sinus   . Depressive disorder, not elsewhere classified   . Benign neoplasm of colon   . Obesity, unspecified   . Pure hypercholesterolemia   . Unspecified urinary incontinence   . Other specified cardiac dysrhythmias(427.89)   . Irritable bowel syndrome   . Diverticulosis of colon (without mention of hemorrhage)   . Dysthymic disorder   . Essential and other specified forms of tremor   . Esophageal reflux   . Allergic rhinitis, cause unspecified   . Type II or unspecified type diabetes mellitus without mention of complication, not stated as uncontrolled   . Abnormal abdominal CT scan 04/2006    slight abn liver  .  Abnormal CT scan, pancreas or bile duct 04/2006    cyst  . Abnormal chest CT 10/07    unable to bx  . Diverticulitis   . Unspecified sleep apnea   . Sleep apnea     CPAP- non compliant  . Arthritis     hands, feet and back    Past Surgical History  Procedure Laterality Date  . Rotator cuff repair  2005  . Kidney stone surgery  1980's    removal  . Partial hysterectomy  1990's  . 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 Cholecystectomy      15-20 years ago  . Abdominal hysterectomy    . Gastrocnemius recession Right 07/27/2015    Procedure: RIGHT GASTROC RECESSION;  Surgeon: Wylene Simmer, MD;  Location: [place];  Service: Orthopedics;  Laterality: Right;  . Calcaneal osteotomy Right 07/27/2015    Procedure: RIGHT CALCANEAL OSTEOTOMY ;  Surgeon: Wylene Simmer, MD;  Location: [place];  Service: Orthopedics;  Laterality: Right;    Family History  Problem Relation Age of Onset  . Cirrhosis Father   . Diabetes      2 aunts  . Clotting disorder Mother   . Cancer      grandmother  . Allergies      "whole family"  . Asthma      "whole family"  Social History:  reports that she has never smoked. She has never used smokeless tobacco. She reports that she does not drink alcohol or use illicit drugs.  Allergies:  Allergies  Allergen Reactions  . Doxycycline Hives  . Epinephrine Hcl     REACTION: Had tremors when it was given.  . Erythromycin Ethylsuccinate     REACTION: abd spasms  . Iohexol      Code: HIVES, Desc: pt needs premedicated-- hives on prev contrast study per md office   . Oxycodone     "felt like I was dying"    Medications: I have reviewed the patient's current medications.  No results found for this or any previous visit (from the past 48 hour(s)).  No results found.  ROS:   Gen: Denies fever, chills, weight change, fatigue, night sweats MSK: : Patient states that she is pain free and c/o wet splint on RLE. Neuro:  Denies headache, numbness, weakness, slurred speech, loss of memory or consciousness Derm: Denies rash, dry skin, scaling or peeling skin change HEENT: Denies blurred vision, double vision, neck stiffness, dysphagia PULM: Denies shortness of breath, cough, sputum production, hemoptysis, wheezing CV: Denies chest pain, edema, orthopnea, palpitations GI: Denies abdominal pain, nausea, vomiting, diarrhea, hematochezia, melena, constipation  Endocrine: Denies hot or cold intolerance, polyuria, polyphagia or appetite change Heme: Denies easy bruising, bleeding, bleeding gums  PE:  General: WDWN patient in NAD. Psych:  Appropriate mood and affect. Neuro:  A&O x 3, Moving all extremities, sensation intact to light touch HEENT:  EOMs intact Chest:  Even non-labored respirations Skin:  Dressing damp but C/D/I, no rashes or lesions.  Dressing removed and skin is dry, incision sites are intact with no active bleeding, purulence or discharge.  Sterile guaze and posterior splint reapplied. Extremities: warm/dry, Mild edema, no erythema, or echymosis.  No lymphadenopathy. Pulses: Dorsalis pedis and post tibialis 2+ MSK:  ROM: EHL/FHL intact, MMT: Patient can perform quad set, (-) Homan's  Blood pressure 135/57, pulse 55, temperature 98.4 F (36.9 C), temperature source Oral, resp. rate 16, height 5' 5"  (1.651 m), weight 95.255 kg (210 lb), SpO2 98 %.   Assessment/Plan: S/P R gastroc recession, right calcaneal osteotomy, right FDL transfer to navicular, and posterior tibialis tenolysis Wet Splint  Splint and dressing removed and incision sites dry and intact.  Sterile guaze, ABD, and posterior splint reapplied. Patient instructed to keep splint dry Plan for post-op f/u appointment with Dr. Doran Durand at the end of next week in the office.   Mechele Claude, PA-C, [employer] Office:  [phone]

## 2015-08-05 NOTE — Discharge Instructions (Signed)
Cast or Splint Care    Casts and splints support injured limbs and keep bones from moving while they heal. It is important to care for your cast or splint at home.  HOME CARE INSTRUCTIONS  Keep the cast or splint uncovered during the drying period. It can take 24 to 48 hours to dry if it is made of plaster. A fiberglass cast will dry in less than 1 hour.  Do not rest the cast on anything harder than a pillow for the first 24 hours.  Do not put weight on your injured limb or apply pressure to the cast until your health care provider gives you permission.  Keep the cast or splint dry. Wet casts or splints can lose their shape and may not support the limb as well. A wet cast that has lost its shape can also create harmful pressure on your skin when it dries. Also, wet skin can become infected.  Cover the cast or splint with a plastic bag when bathing or when out in the rain or snow. If the cast is on the trunk of the body, take sponge baths until the cast is removed.  If your cast does become wet, dry it with a towel or a blow dryer on the cool setting only. Keep your cast or splint clean. Soiled casts may be wiped with a moistened cloth.  Do not place any hard or soft foreign objects under your cast or splint, such as cotton, toilet paper, lotion, or powder.  Do not try to scratch the skin under the cast with any object. The object could get stuck inside the cast. Also, scratching could lead to an infection. If itching is a problem, use a blow dryer on a cool setting to relieve discomfort.  Do not trim or cut your cast or remove padding from inside of it.  Exercise all joints next to the injury that are not immobilized by the cast or splint. For example, if you have a long leg cast, exercise the hip joint and toes. If you have an arm cast or splint, exercise the shoulder, elbow, thumb, and fingers.  Elevate your injured arm or leg on 1 or 2 pillows for the first 1 to 3 days to decrease swelling and  pain. It is best if you can comfortably elevate your cast so it is higher than your heart. SEEK MEDICAL CARE IF:  Your cast or splint cracks.  Your cast or splint is too tight or too loose.  You have unbearable itching inside the cast.  Your cast becomes wet or develops a soft spot or area.  You have a bad smell coming from inside your cast.  You get an object stuck under your cast.  Your skin around the cast becomes red or raw.  You have new pain or worsening pain after the cast has been applied. SEEK IMMEDIATE MEDICAL CARE IF:  You have fluid leaking through the cast.  You are unable to move your fingers or toes.  You have discolored (blue or white), cool, painful, or very swollen fingers or toes beyond the cast.  You have tingling or numbness around the injured area.  You have severe pain or pressure under the cast.  You have any difficulty with your breathing or have shortness of breath.  You have chest pain. This information is not intended to replace advice given to you by your health care provider. Make sure you discuss any questions you have with your health care provider.  Document Released: 07/05/2000 Document Revised: 04/28/2013 Document Reviewed: 01/14/2013  Elsevier Interactive Patient Education [employer].

## 2015-08-05 NOTE — ED Provider Notes (Signed)
CSN: [id number]     Arrival date & time 08/05/15  1200 History   First MD Initiated Contact with Patient 08/05/15 1208     Chief Complaint  Patient presents with  . Leg Pain   HPI  [name] [name] is a 70 year old female who presents an with leg pain. She underwent surgery on her right lower leg 9 days ago. Surgeon Dr. Doran Durand. She states that the cast has been irritating her ankle since the surgery. She was icing her leg last evening when ice water dripped into the cast and she believes it changed the molding. She states that she feels like she is being stabbed in the ankle by the plaster. She called her orthopedic surgeon's office and mentioned to her to come to the emergency department since Dr. Doran Durand is on-call today. He will see her for cast revision or provide instructions to the ortho tech. Denies pain of the calf or foot. Denies numbness, tingling or loss of sensation in the toes.  Past Medical History  Diagnosis Date  . Acute cystitis   . Acute pharyngitis   . Other diseases of lung, not elsewhere classified   . Cyst and pseudocyst of pancreas   . Polyneuropathy in diabetes(357.2)   . Pain in joint, lower leg   . Type II or unspecified type diabetes mellitus with neurological manifestations, not stated as uncontrolled   . Edema   . Dysuria   . Unspecified disorder of liver   . Periapical abscess without sinus   . Depressive disorder, not elsewhere classified   . Benign neoplasm of colon   . Obesity, unspecified   . Pure hypercholesterolemia   . Unspecified urinary incontinence   . Other specified cardiac dysrhythmias(427.89)   . Irritable bowel syndrome   . Diverticulosis of colon (without mention of hemorrhage)   . Dysthymic disorder   . Essential and other specified forms of tremor   . Esophageal reflux   . Allergic rhinitis, cause unspecified   . Type II or unspecified type diabetes mellitus without mention of complication, not stated as uncontrolled   . Abnormal  abdominal CT scan 04/2006    slight abn liver  . Abnormal CT scan, pancreas or bile duct 04/2006    cyst  . Abnormal chest CT 10/07    unable to bx  . Diverticulitis   . Unspecified sleep apnea   . Sleep apnea     CPAP- non compliant  . Arthritis     hands, feet and back   Past Surgical History  Procedure Laterality Date  . Rotator cuff repair  2005  . Kidney stone surgery  1980's    removal  . Partial hysterectomy  1990's  . 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 Cholecystectomy      15-20 years ago  . Abdominal hysterectomy    . Gastrocnemius recession Right 07/27/2015    Procedure: RIGHT GASTROC RECESSION;  Surgeon: Wylene Simmer, MD;  Location: [place];  Service: Orthopedics;  Laterality: Right;  . Calcaneal osteotomy Right 07/27/2015    Procedure: RIGHT CALCANEAL OSTEOTOMY ;  Surgeon: Wylene Simmer, MD;  Location: [place];  Service: Orthopedics;  Laterality: Right;   Family History  Problem Relation Age of Onset  . Cirrhosis Father   . Diabetes      2 aunts  . Clotting disorder Mother   . Cancer      grandmother  . Allergies      "  whole family"  . Asthma      "whole family"   Social History  Substance Use Topics  . Smoking status: Never Smoker   . Smokeless tobacco: Never Used  . Alcohol Use: No   OB History    Gravida Para Term Preterm AB TAB SAB Ectopic Multiple Living   6 4   1           Review of Systems  Musculoskeletal:       Ankle pain from cast  All other systems reviewed and are negative.     Allergies  Doxycycline; Epinephrine hcl; Erythromycin ethylsuccinate; Iohexol; and Oxycodone  Home Medications   Prior to Admission medications   Medication Sig Start Date End Date Taking? Authorizing Provider  aspirin EC 325 MG tablet Take 1 tablet (325 mg total) by mouth daily. 07/27/15  Yes Corky Sing, PA-C  Calcium Carbonate-Vitamin D (CALCIUM + D PO) Take 1,200 mg by mouth 2 (two) times daily.    Yes Historical  Provider, MD  fluticasone (FLONASE) 50 MCG/ACT nasal spray 1-2 puffs each nostril, once or twice daily for allergy 01/29/13  Yes Clinton D Young, MD  furosemide (LASIX) 20 MG tablet TAKE 1 TABLET EVERY DAY 02/20/15  Yes Amy E Bedsole, MD  Insulin Pen Needle (B-D ULTRAFINE III SHORT PEN) 31G X 8 MM MISC Use to inject insulin two times a day.  Dx: E11.9 12/01/14  Yes Amy Cletis [place], MD  metoprolol (LOPRESSOR) 50 MG tablet TAKE 1 TABLET BY MOUTH TWICE A DAY 10/19/14  Yes Amy E Bedsole, MD  naproxen (NAPROSYN) 250 MG tablet Take 500 mg by mouth 2 (two) times daily with a meal.   Yes Historical Provider, MD  NEXIUM 40 MG capsule TAKE 1 CAPSULE BY MOUTH DAILY 10/28/12  Yes Amy E Bedsole, MD  nitrofurantoin (MACRODANTIN) 100 MG capsule START WITH 1 CAPSULE TWO TIMES A DAY FOR UTI THEN CHANGE TO 1 CAPSULE DAILY FOR PREVENTION 01/26/15  Yes Amy E Bedsole, MD  NOVOLIN 70/30 RELION (70-30) 100 UNIT/ML injection Inject 70 Units into the skin 2 (two) times daily. 12/23/12  Yes Historical Provider, MD  omeprazole (PRILOSEC OTC) 20 MG tablet Take 20 mg by mouth daily as needed (heartburn).    Yes Historical Provider, MD  ONE TOUCH ULTRA TEST test strip USE 2 TIMES A DAY (DIAG CODE 250.00) 03/10/14  Yes Amy Cletis [place], MD  ONETOUCH DELICA LANCETS FINE [place]  08/18/14  Yes Historical Provider, MD  temazepam (RESTORIL) 15 MG capsule Take 1-2 capsules (15-30 mg total) by mouth at bedtime as needed. 01/31/14  Yes Deneise Lever, MD  oxyCODONE (ROXICODONE) 5 MG immediate release tablet Take 1-2 tablets (5-10 mg total) by mouth every 4 (four) hours as needed for moderate pain or severe pain. Patient not taking: Reported on 08/05/2015 07/27/15   Marya Amsler Ollis, PA-C   BP 135/57 mmHg  Pulse 55  Temp(Src) 98.4 F (36.9 C) (Oral)  Resp 16  Ht 5' 5"  (1.651 m)  Wt 95.255 kg  BMI 34.95 kg/m2  SpO2 98% Physical Exam  Constitutional: She appears well-developed and well-nourished. No distress.  HENT:  Head: Normocephalic and  atraumatic.  Eyes: Conjunctivae are normal. Right eye exhibits no discharge. Left eye exhibits no discharge. No scleral icterus.  Neck: Normal range of motion.  Cardiovascular: Normal rate and regular rhythm.   Pulmonary/Chest: Effort normal. No respiratory distress.  Musculoskeletal: Normal range of motion.  Right lower leg in below the knee cast. Right  motion of the knee and toes. No edema of the toes. No color change, pallor or duskiness of foot.  Neurological: She is alert. Coordination normal.  Skin: Skin is warm and dry.  Psychiatric: She has a normal mood and affect. Her behavior is normal.  Nursing note and vitals reviewed.   ED Course  Procedures (including critical care time) Labs Review Labs Reviewed - No data to display  Imaging Review No results found. I have personally reviewed and evaluated these images and lab results as part of my medical decision-making.   EKG Interpretation None      MDM   Final diagnoses:  Cast discomfort   Patient presenting with an from her cast after getting it wet. No pain in her leg, numbness, tingling, loss of sensation or color change. Consult to Dr. Doran Durand about cast change. His physician assistant evaluated the patient in the emergency department and instructed ortho tech on new cast application. She is to follow-up with Dr. Doran Durand in his office in 6 days. Return precautions given in discharge paperwork and discussed with pt at bedside. Pt stable for discharge    Josephina Gip, PA-C 08/05/15 [place], MD 08/16/15 1318

## 2015-08-05 NOTE — ED Notes (Signed)
Orrtho tech and Ortho PA with pt.

## 2015-08-05 NOTE — ED Notes (Signed)
Pt arrives with cast to RLE. Pt states she had ise to left leg last night and fluids have leaked into cast making it painful.

## 2015-08-05 NOTE — Progress Notes (Signed)
Orthopedic Tech Progress Note Patient Details:  [name] [name] 26-Sep-1945 [id number]  Ortho Devices Type of Ortho Device: Ace wrap, Post (short leg) splint, Stirrup splint Ortho Device/Splint Interventions: Application   [name] 08/05/2015, 2:10 PM

## 2015-08-05 NOTE — ED Notes (Signed)
Pt verbalizes understanding of instructions. Home with family.

## 2015-08-11 DIAGNOSIS — M76821 Posterior tibial tendinitis, right leg: Secondary | ICD-10-CM | POA: Diagnosis not present

## 2015-08-11 DIAGNOSIS — Z4789 Encounter for other orthopedic aftercare: Secondary | ICD-10-CM | POA: Diagnosis not present

## 2015-08-11 DIAGNOSIS — M6701 Short Achilles tendon (acquired), right ankle: Secondary | ICD-10-CM | POA: Diagnosis not present

## 2015-09-01 ENCOUNTER — Other Ambulatory Visit: Payer: Self-pay | Admitting: Family Medicine

## 2015-09-08 DIAGNOSIS — M76821 Posterior tibial tendinitis, right leg: Secondary | ICD-10-CM | POA: Diagnosis not present

## 2015-09-08 DIAGNOSIS — M6701 Short Achilles tendon (acquired), right ankle: Secondary | ICD-10-CM | POA: Diagnosis not present

## 2015-09-08 DIAGNOSIS — Z4789 Encounter for other orthopedic aftercare: Secondary | ICD-10-CM | POA: Diagnosis not present

## 2015-09-12 DIAGNOSIS — I10 Essential (primary) hypertension: Secondary | ICD-10-CM | POA: Diagnosis not present

## 2015-09-12 DIAGNOSIS — E7800 Pure hypercholesterolemia, unspecified: Secondary | ICD-10-CM | POA: Diagnosis not present

## 2015-09-12 DIAGNOSIS — E1165 Type 2 diabetes mellitus with hyperglycemia: Secondary | ICD-10-CM | POA: Diagnosis not present

## 2015-09-12 DIAGNOSIS — R740 Nonspecific elevation of levels of transaminase and lactic acid dehydrogenase [LDH]: Secondary | ICD-10-CM | POA: Diagnosis not present

## 2015-10-06 DIAGNOSIS — Z4789 Encounter for other orthopedic aftercare: Secondary | ICD-10-CM | POA: Diagnosis not present

## 2015-10-19 DIAGNOSIS — M6701 Short Achilles tendon (acquired), right ankle: Secondary | ICD-10-CM | POA: Diagnosis not present

## 2015-11-08 DIAGNOSIS — Z4789 Encounter for other orthopedic aftercare: Secondary | ICD-10-CM | POA: Diagnosis not present

## 2015-11-13 DIAGNOSIS — M4806 Spinal stenosis, lumbar region: Secondary | ICD-10-CM | POA: Diagnosis not present

## 2015-11-13 DIAGNOSIS — M5136 Other intervertebral disc degeneration, lumbar region: Secondary | ICD-10-CM | POA: Diagnosis not present

## 2015-11-13 DIAGNOSIS — M545 Low back pain: Secondary | ICD-10-CM | POA: Diagnosis not present

## 2015-12-12 DIAGNOSIS — M5136 Other intervertebral disc degeneration, lumbar region: Secondary | ICD-10-CM | POA: Diagnosis not present

## 2016-01-29 DIAGNOSIS — K9049 Malabsorption due to intolerance, not elsewhere classified: Secondary | ICD-10-CM | POA: Diagnosis not present

## 2016-01-29 DIAGNOSIS — R251 Tremor, unspecified: Secondary | ICD-10-CM | POA: Diagnosis not present

## 2016-01-29 DIAGNOSIS — T887XXA Unspecified adverse effect of drug or medicament, initial encounter: Secondary | ICD-10-CM | POA: Diagnosis not present

## 2016-02-13 ENCOUNTER — Telehealth: Payer: Self-pay | Admitting: Internal Medicine

## 2016-02-13 NOTE — Telephone Encounter (Signed)
It sounds as if she might have a little bronchitis. Mucinex DM might help with that.  I am more concerned about the "pouch of fluid" and back pain se describes as possibly associated with an injection. There might possibly be an infection at the injection site and I would press for her to be seen ASAPat the ofice where that injection was done so they can check it.

## 2016-02-13 NOTE — Telephone Encounter (Signed)
Called spoke with pt and is aware of recs below. She verbalized understanding and needed nothing further

## 2016-02-13 NOTE — Telephone Encounter (Signed)
Spoke with pt, c/o back pain going from lower back up to neck and shoulders, has a "painful pouch of fluid" on lower back (pt clarified that this is not a visible pouch like a blister, but a painful pouch under the skin), SOB, nonprod cough, getting "yellow fluid in mouth after coughing" but denies mucus production, lethargic, fatigued.  Pt states s/s have been present X3-4 weeks, pt had an injection in back about the same time s/s started.   Pt states she has not been taking diuretics as she should be, but is trying to take this daily which usually helps her back pain.  Pt is taking nothing else to help with s/s.  Pt has not yet called Dr. Tonita Cong who manages her back pain.   Pt uses [facility] on [address].    Last ov: 03/30/15 Next ov: none.  CY please advise.  Thanks!   Allergies  Allergen Reactions  . Doxycycline Hives  . Epinephrine Hcl     REACTION: Had tremors when it was given.  . Erythromycin Ethylsuccinate     REACTION: abd spasms  . Iohexol      Code: HIVES, Desc: pt needs premedicated-- hives on prev contrast study per md office   . Oxycodone     "felt like I was dying"   Current Outpatient Prescriptions on File Prior to Visit  Medication Sig Dispense Refill  . aspirin EC 325 MG tablet Take 1 tablet (325 mg total) by mouth daily. 42 tablet 0  . Calcium Carbonate-Vitamin D (CALCIUM + D PO) Take 1,200 mg by mouth 2 (two) times daily.     . fluticasone (FLONASE) 50 MCG/ACT nasal spray 1-2 puffs each nostril, once or twice daily for allergy 16 g prn  . furosemide (LASIX) 20 MG tablet TAKE 1 TABLET BY MOUTH DAILY 90 tablet 1  . Insulin Pen Needle (B-D ULTRAFINE III SHORT PEN) 31G X 8 MM MISC Use to inject insulin two times a day.  Dx: E11.9 100 each 0  . metoprolol (LOPRESSOR) 50 MG tablet TAKE 1 TABLET BY MOUTH TWICE A DAY 180 tablet 3  . naproxen (NAPROSYN) 250 MG tablet Take 500 mg by mouth 2 (two) times daily with a meal.    . NEXIUM 40 MG capsule TAKE 1 CAPSULE BY MOUTH DAILY  90 capsule 1  . nitrofurantoin (MACRODANTIN) 100 MG capsule START WITH 1 CAPSULE TWO TIMES A DAY FOR UTI THEN CHANGE TO 1 CAPSULE DAILY FOR PREVENTION 30 capsule 10  . NOVOLIN 70/30 RELION (70-30) 100 UNIT/ML injection Inject 70 Units into the skin 2 (two) times daily.    Marland Kitchen omeprazole (PRILOSEC OTC) 20 MG tablet Take 20 mg by mouth daily as needed (heartburn).     . ONE TOUCH ULTRA TEST test strip USE 2 TIMES A DAY (DIAG CODE 250.00) 100 each 11  . ONETOUCH DELICA LANCETS FINE MISC   6  . oxyCODONE (ROXICODONE) 5 MG immediate release tablet Take 1-2 tablets (5-10 mg total) by mouth every 4 (four) hours as needed for moderate pain or severe pain. (Patient not taking: Reported on 08/05/2015) 30 tablet 0  . temazepam (RESTORIL) 15 MG capsule Take 1-2 capsules (15-30 mg total) by mouth at bedtime as needed. 30 capsule 5   No current facility-administered medications on file prior to visit.

## 2016-02-14 DIAGNOSIS — M4806 Spinal stenosis, lumbar region: Secondary | ICD-10-CM | POA: Diagnosis not present

## 2016-03-02 ENCOUNTER — Other Ambulatory Visit: Payer: Self-pay | Admitting: Family Medicine

## 2016-03-11 DIAGNOSIS — R740 Nonspecific elevation of levels of transaminase and lactic acid dehydrogenase [LDH]: Secondary | ICD-10-CM | POA: Diagnosis not present

## 2016-03-11 DIAGNOSIS — R5381 Other malaise: Secondary | ICD-10-CM | POA: Diagnosis not present

## 2016-03-11 DIAGNOSIS — E7800 Pure hypercholesterolemia, unspecified: Secondary | ICD-10-CM | POA: Diagnosis not present

## 2016-03-11 DIAGNOSIS — I10 Essential (primary) hypertension: Secondary | ICD-10-CM | POA: Diagnosis not present

## 2016-03-11 DIAGNOSIS — E1165 Type 2 diabetes mellitus with hyperglycemia: Secondary | ICD-10-CM | POA: Diagnosis not present

## 2016-03-19 DIAGNOSIS — M9904 Segmental and somatic dysfunction of sacral region: Secondary | ICD-10-CM | POA: Diagnosis not present

## 2016-03-19 DIAGNOSIS — M25551 Pain in right hip: Secondary | ICD-10-CM | POA: Diagnosis not present

## 2016-03-19 DIAGNOSIS — M461 Sacroiliitis, not elsewhere classified: Secondary | ICD-10-CM | POA: Diagnosis not present

## 2016-03-19 DIAGNOSIS — M9901 Segmental and somatic dysfunction of cervical region: Secondary | ICD-10-CM | POA: Diagnosis not present

## 2016-03-19 DIAGNOSIS — M5136 Other intervertebral disc degeneration, lumbar region: Secondary | ICD-10-CM | POA: Diagnosis not present

## 2016-03-19 DIAGNOSIS — M9903 Segmental and somatic dysfunction of lumbar region: Secondary | ICD-10-CM | POA: Diagnosis not present

## 2016-03-19 DIAGNOSIS — M50321 Other cervical disc degeneration at C4-C5 level: Secondary | ICD-10-CM | POA: Diagnosis not present

## 2016-03-19 DIAGNOSIS — M9905 Segmental and somatic dysfunction of pelvic region: Secondary | ICD-10-CM | POA: Diagnosis not present

## 2016-03-19 DIAGNOSIS — M9902 Segmental and somatic dysfunction of thoracic region: Secondary | ICD-10-CM | POA: Diagnosis not present

## 2016-03-20 DIAGNOSIS — M25551 Pain in right hip: Secondary | ICD-10-CM | POA: Diagnosis not present

## 2016-03-20 DIAGNOSIS — M9905 Segmental and somatic dysfunction of pelvic region: Secondary | ICD-10-CM | POA: Diagnosis not present

## 2016-03-20 DIAGNOSIS — M5136 Other intervertebral disc degeneration, lumbar region: Secondary | ICD-10-CM | POA: Diagnosis not present

## 2016-03-20 DIAGNOSIS — M9904 Segmental and somatic dysfunction of sacral region: Secondary | ICD-10-CM | POA: Diagnosis not present

## 2016-03-20 DIAGNOSIS — M9902 Segmental and somatic dysfunction of thoracic region: Secondary | ICD-10-CM | POA: Diagnosis not present

## 2016-03-20 DIAGNOSIS — M50321 Other cervical disc degeneration at C4-C5 level: Secondary | ICD-10-CM | POA: Diagnosis not present

## 2016-03-20 DIAGNOSIS — M461 Sacroiliitis, not elsewhere classified: Secondary | ICD-10-CM | POA: Diagnosis not present

## 2016-03-20 DIAGNOSIS — M9903 Segmental and somatic dysfunction of lumbar region: Secondary | ICD-10-CM | POA: Diagnosis not present

## 2016-03-20 DIAGNOSIS — M9901 Segmental and somatic dysfunction of cervical region: Secondary | ICD-10-CM | POA: Diagnosis not present

## 2016-03-21 DIAGNOSIS — M9905 Segmental and somatic dysfunction of pelvic region: Secondary | ICD-10-CM | POA: Diagnosis not present

## 2016-03-21 DIAGNOSIS — M9901 Segmental and somatic dysfunction of cervical region: Secondary | ICD-10-CM | POA: Diagnosis not present

## 2016-03-21 DIAGNOSIS — M9902 Segmental and somatic dysfunction of thoracic region: Secondary | ICD-10-CM | POA: Diagnosis not present

## 2016-03-21 DIAGNOSIS — M50321 Other cervical disc degeneration at C4-C5 level: Secondary | ICD-10-CM | POA: Diagnosis not present

## 2016-03-21 DIAGNOSIS — M9904 Segmental and somatic dysfunction of sacral region: Secondary | ICD-10-CM | POA: Diagnosis not present

## 2016-03-21 DIAGNOSIS — M5136 Other intervertebral disc degeneration, lumbar region: Secondary | ICD-10-CM | POA: Diagnosis not present

## 2016-03-21 DIAGNOSIS — M25551 Pain in right hip: Secondary | ICD-10-CM | POA: Diagnosis not present

## 2016-03-21 DIAGNOSIS — M9903 Segmental and somatic dysfunction of lumbar region: Secondary | ICD-10-CM | POA: Diagnosis not present

## 2016-03-21 DIAGNOSIS — M461 Sacroiliitis, not elsewhere classified: Secondary | ICD-10-CM | POA: Diagnosis not present

## 2016-03-26 DIAGNOSIS — M461 Sacroiliitis, not elsewhere classified: Secondary | ICD-10-CM | POA: Diagnosis not present

## 2016-03-26 DIAGNOSIS — M25551 Pain in right hip: Secondary | ICD-10-CM | POA: Diagnosis not present

## 2016-03-26 DIAGNOSIS — M5136 Other intervertebral disc degeneration, lumbar region: Secondary | ICD-10-CM | POA: Diagnosis not present

## 2016-03-26 DIAGNOSIS — M9901 Segmental and somatic dysfunction of cervical region: Secondary | ICD-10-CM | POA: Diagnosis not present

## 2016-03-26 DIAGNOSIS — M9905 Segmental and somatic dysfunction of pelvic region: Secondary | ICD-10-CM | POA: Diagnosis not present

## 2016-03-26 DIAGNOSIS — M9903 Segmental and somatic dysfunction of lumbar region: Secondary | ICD-10-CM | POA: Diagnosis not present

## 2016-03-26 DIAGNOSIS — M50321 Other cervical disc degeneration at C4-C5 level: Secondary | ICD-10-CM | POA: Diagnosis not present

## 2016-03-26 DIAGNOSIS — M9904 Segmental and somatic dysfunction of sacral region: Secondary | ICD-10-CM | POA: Diagnosis not present

## 2016-03-26 DIAGNOSIS — M9902 Segmental and somatic dysfunction of thoracic region: Secondary | ICD-10-CM | POA: Diagnosis not present

## 2016-03-27 DIAGNOSIS — M461 Sacroiliitis, not elsewhere classified: Secondary | ICD-10-CM | POA: Diagnosis not present

## 2016-03-27 DIAGNOSIS — M9905 Segmental and somatic dysfunction of pelvic region: Secondary | ICD-10-CM | POA: Diagnosis not present

## 2016-03-27 DIAGNOSIS — M50321 Other cervical disc degeneration at C4-C5 level: Secondary | ICD-10-CM | POA: Diagnosis not present

## 2016-03-27 DIAGNOSIS — M9904 Segmental and somatic dysfunction of sacral region: Secondary | ICD-10-CM | POA: Diagnosis not present

## 2016-03-27 DIAGNOSIS — M5136 Other intervertebral disc degeneration, lumbar region: Secondary | ICD-10-CM | POA: Diagnosis not present

## 2016-03-27 DIAGNOSIS — M25551 Pain in right hip: Secondary | ICD-10-CM | POA: Diagnosis not present

## 2016-03-27 DIAGNOSIS — M9902 Segmental and somatic dysfunction of thoracic region: Secondary | ICD-10-CM | POA: Diagnosis not present

## 2016-03-27 DIAGNOSIS — M9901 Segmental and somatic dysfunction of cervical region: Secondary | ICD-10-CM | POA: Diagnosis not present

## 2016-03-27 DIAGNOSIS — M9903 Segmental and somatic dysfunction of lumbar region: Secondary | ICD-10-CM | POA: Diagnosis not present

## 2016-03-28 DIAGNOSIS — M5136 Other intervertebral disc degeneration, lumbar region: Secondary | ICD-10-CM | POA: Diagnosis not present

## 2016-03-28 DIAGNOSIS — M9902 Segmental and somatic dysfunction of thoracic region: Secondary | ICD-10-CM | POA: Diagnosis not present

## 2016-03-28 DIAGNOSIS — M25551 Pain in right hip: Secondary | ICD-10-CM | POA: Diagnosis not present

## 2016-03-28 DIAGNOSIS — M461 Sacroiliitis, not elsewhere classified: Secondary | ICD-10-CM | POA: Diagnosis not present

## 2016-03-28 DIAGNOSIS — M9903 Segmental and somatic dysfunction of lumbar region: Secondary | ICD-10-CM | POA: Diagnosis not present

## 2016-03-28 DIAGNOSIS — M9905 Segmental and somatic dysfunction of pelvic region: Secondary | ICD-10-CM | POA: Diagnosis not present

## 2016-03-28 DIAGNOSIS — M50321 Other cervical disc degeneration at C4-C5 level: Secondary | ICD-10-CM | POA: Diagnosis not present

## 2016-03-28 DIAGNOSIS — M9901 Segmental and somatic dysfunction of cervical region: Secondary | ICD-10-CM | POA: Diagnosis not present

## 2016-03-28 DIAGNOSIS — M9904 Segmental and somatic dysfunction of sacral region: Secondary | ICD-10-CM | POA: Diagnosis not present

## 2016-04-01 DIAGNOSIS — M50321 Other cervical disc degeneration at C4-C5 level: Secondary | ICD-10-CM | POA: Diagnosis not present

## 2016-04-01 DIAGNOSIS — M9901 Segmental and somatic dysfunction of cervical region: Secondary | ICD-10-CM | POA: Diagnosis not present

## 2016-04-01 DIAGNOSIS — M9905 Segmental and somatic dysfunction of pelvic region: Secondary | ICD-10-CM | POA: Diagnosis not present

## 2016-04-01 DIAGNOSIS — M25551 Pain in right hip: Secondary | ICD-10-CM | POA: Diagnosis not present

## 2016-04-01 DIAGNOSIS — M461 Sacroiliitis, not elsewhere classified: Secondary | ICD-10-CM | POA: Diagnosis not present

## 2016-04-01 DIAGNOSIS — M9902 Segmental and somatic dysfunction of thoracic region: Secondary | ICD-10-CM | POA: Diagnosis not present

## 2016-04-01 DIAGNOSIS — M9904 Segmental and somatic dysfunction of sacral region: Secondary | ICD-10-CM | POA: Diagnosis not present

## 2016-04-01 DIAGNOSIS — M5136 Other intervertebral disc degeneration, lumbar region: Secondary | ICD-10-CM | POA: Diagnosis not present

## 2016-04-01 DIAGNOSIS — M9903 Segmental and somatic dysfunction of lumbar region: Secondary | ICD-10-CM | POA: Diagnosis not present

## 2016-04-03 DIAGNOSIS — M50321 Other cervical disc degeneration at C4-C5 level: Secondary | ICD-10-CM | POA: Diagnosis not present

## 2016-04-03 DIAGNOSIS — M9905 Segmental and somatic dysfunction of pelvic region: Secondary | ICD-10-CM | POA: Diagnosis not present

## 2016-04-03 DIAGNOSIS — M9903 Segmental and somatic dysfunction of lumbar region: Secondary | ICD-10-CM | POA: Diagnosis not present

## 2016-04-03 DIAGNOSIS — M5136 Other intervertebral disc degeneration, lumbar region: Secondary | ICD-10-CM | POA: Diagnosis not present

## 2016-04-03 DIAGNOSIS — M25551 Pain in right hip: Secondary | ICD-10-CM | POA: Diagnosis not present

## 2016-04-03 DIAGNOSIS — M9902 Segmental and somatic dysfunction of thoracic region: Secondary | ICD-10-CM | POA: Diagnosis not present

## 2016-04-03 DIAGNOSIS — M9904 Segmental and somatic dysfunction of sacral region: Secondary | ICD-10-CM | POA: Diagnosis not present

## 2016-04-03 DIAGNOSIS — M461 Sacroiliitis, not elsewhere classified: Secondary | ICD-10-CM | POA: Diagnosis not present

## 2016-04-03 DIAGNOSIS — M9901 Segmental and somatic dysfunction of cervical region: Secondary | ICD-10-CM | POA: Diagnosis not present

## 2016-04-09 DIAGNOSIS — M9903 Segmental and somatic dysfunction of lumbar region: Secondary | ICD-10-CM | POA: Diagnosis not present

## 2016-04-09 DIAGNOSIS — M9902 Segmental and somatic dysfunction of thoracic region: Secondary | ICD-10-CM | POA: Diagnosis not present

## 2016-04-09 DIAGNOSIS — M9905 Segmental and somatic dysfunction of pelvic region: Secondary | ICD-10-CM | POA: Diagnosis not present

## 2016-04-09 DIAGNOSIS — M9901 Segmental and somatic dysfunction of cervical region: Secondary | ICD-10-CM | POA: Diagnosis not present

## 2016-04-09 DIAGNOSIS — M461 Sacroiliitis, not elsewhere classified: Secondary | ICD-10-CM | POA: Diagnosis not present

## 2016-04-09 DIAGNOSIS — M9904 Segmental and somatic dysfunction of sacral region: Secondary | ICD-10-CM | POA: Diagnosis not present

## 2016-04-09 DIAGNOSIS — M50321 Other cervical disc degeneration at C4-C5 level: Secondary | ICD-10-CM | POA: Diagnosis not present

## 2016-04-09 DIAGNOSIS — M5136 Other intervertebral disc degeneration, lumbar region: Secondary | ICD-10-CM | POA: Diagnosis not present

## 2016-04-09 DIAGNOSIS — M25551 Pain in right hip: Secondary | ICD-10-CM | POA: Diagnosis not present

## 2016-04-11 DIAGNOSIS — M9903 Segmental and somatic dysfunction of lumbar region: Secondary | ICD-10-CM | POA: Diagnosis not present

## 2016-04-11 DIAGNOSIS — M50321 Other cervical disc degeneration at C4-C5 level: Secondary | ICD-10-CM | POA: Diagnosis not present

## 2016-04-11 DIAGNOSIS — M9904 Segmental and somatic dysfunction of sacral region: Secondary | ICD-10-CM | POA: Diagnosis not present

## 2016-04-11 DIAGNOSIS — M9901 Segmental and somatic dysfunction of cervical region: Secondary | ICD-10-CM | POA: Diagnosis not present

## 2016-04-11 DIAGNOSIS — M5136 Other intervertebral disc degeneration, lumbar region: Secondary | ICD-10-CM | POA: Diagnosis not present

## 2016-04-11 DIAGNOSIS — M9902 Segmental and somatic dysfunction of thoracic region: Secondary | ICD-10-CM | POA: Diagnosis not present

## 2016-04-11 DIAGNOSIS — M461 Sacroiliitis, not elsewhere classified: Secondary | ICD-10-CM | POA: Diagnosis not present

## 2016-04-11 DIAGNOSIS — M25551 Pain in right hip: Secondary | ICD-10-CM | POA: Diagnosis not present

## 2016-04-11 DIAGNOSIS — M9905 Segmental and somatic dysfunction of pelvic region: Secondary | ICD-10-CM | POA: Diagnosis not present

## 2016-04-12 ENCOUNTER — Telehealth: Payer: Self-pay | Admitting: Family Medicine

## 2016-04-12 NOTE — Telephone Encounter (Signed)
I spoke with pt to schedule a follow up visit and close care gaps. Pt stated she didn't see the value at this time and would call when needed.

## 2016-04-16 DIAGNOSIS — M9905 Segmental and somatic dysfunction of pelvic region: Secondary | ICD-10-CM | POA: Diagnosis not present

## 2016-04-16 DIAGNOSIS — M9902 Segmental and somatic dysfunction of thoracic region: Secondary | ICD-10-CM | POA: Diagnosis not present

## 2016-04-16 DIAGNOSIS — M9904 Segmental and somatic dysfunction of sacral region: Secondary | ICD-10-CM | POA: Diagnosis not present

## 2016-04-16 DIAGNOSIS — M461 Sacroiliitis, not elsewhere classified: Secondary | ICD-10-CM | POA: Diagnosis not present

## 2016-04-16 DIAGNOSIS — M5136 Other intervertebral disc degeneration, lumbar region: Secondary | ICD-10-CM | POA: Diagnosis not present

## 2016-04-16 DIAGNOSIS — M25551 Pain in right hip: Secondary | ICD-10-CM | POA: Diagnosis not present

## 2016-04-16 DIAGNOSIS — M9901 Segmental and somatic dysfunction of cervical region: Secondary | ICD-10-CM | POA: Diagnosis not present

## 2016-04-16 DIAGNOSIS — M50321 Other cervical disc degeneration at C4-C5 level: Secondary | ICD-10-CM | POA: Diagnosis not present

## 2016-04-16 DIAGNOSIS — M9903 Segmental and somatic dysfunction of lumbar region: Secondary | ICD-10-CM | POA: Diagnosis not present

## 2016-04-18 DIAGNOSIS — M9902 Segmental and somatic dysfunction of thoracic region: Secondary | ICD-10-CM | POA: Diagnosis not present

## 2016-04-18 DIAGNOSIS — M50321 Other cervical disc degeneration at C4-C5 level: Secondary | ICD-10-CM | POA: Diagnosis not present

## 2016-04-18 DIAGNOSIS — M25551 Pain in right hip: Secondary | ICD-10-CM | POA: Diagnosis not present

## 2016-04-18 DIAGNOSIS — M461 Sacroiliitis, not elsewhere classified: Secondary | ICD-10-CM | POA: Diagnosis not present

## 2016-04-18 DIAGNOSIS — M5136 Other intervertebral disc degeneration, lumbar region: Secondary | ICD-10-CM | POA: Diagnosis not present

## 2016-04-18 DIAGNOSIS — M9904 Segmental and somatic dysfunction of sacral region: Secondary | ICD-10-CM | POA: Diagnosis not present

## 2016-04-18 DIAGNOSIS — M9901 Segmental and somatic dysfunction of cervical region: Secondary | ICD-10-CM | POA: Diagnosis not present

## 2016-04-18 DIAGNOSIS — M9903 Segmental and somatic dysfunction of lumbar region: Secondary | ICD-10-CM | POA: Diagnosis not present

## 2016-04-18 DIAGNOSIS — M9905 Segmental and somatic dysfunction of pelvic region: Secondary | ICD-10-CM | POA: Diagnosis not present

## 2016-04-23 DIAGNOSIS — M9903 Segmental and somatic dysfunction of lumbar region: Secondary | ICD-10-CM | POA: Diagnosis not present

## 2016-04-23 DIAGNOSIS — M9901 Segmental and somatic dysfunction of cervical region: Secondary | ICD-10-CM | POA: Diagnosis not present

## 2016-04-23 DIAGNOSIS — M5136 Other intervertebral disc degeneration, lumbar region: Secondary | ICD-10-CM | POA: Diagnosis not present

## 2016-04-23 DIAGNOSIS — M25551 Pain in right hip: Secondary | ICD-10-CM | POA: Diagnosis not present

## 2016-04-23 DIAGNOSIS — M461 Sacroiliitis, not elsewhere classified: Secondary | ICD-10-CM | POA: Diagnosis not present

## 2016-04-23 DIAGNOSIS — M9902 Segmental and somatic dysfunction of thoracic region: Secondary | ICD-10-CM | POA: Diagnosis not present

## 2016-04-23 DIAGNOSIS — M50321 Other cervical disc degeneration at C4-C5 level: Secondary | ICD-10-CM | POA: Diagnosis not present

## 2016-04-23 DIAGNOSIS — M9905 Segmental and somatic dysfunction of pelvic region: Secondary | ICD-10-CM | POA: Diagnosis not present

## 2016-04-23 DIAGNOSIS — M9904 Segmental and somatic dysfunction of sacral region: Secondary | ICD-10-CM | POA: Diagnosis not present

## 2016-04-25 DIAGNOSIS — M9903 Segmental and somatic dysfunction of lumbar region: Secondary | ICD-10-CM | POA: Diagnosis not present

## 2016-04-25 DIAGNOSIS — M9902 Segmental and somatic dysfunction of thoracic region: Secondary | ICD-10-CM | POA: Diagnosis not present

## 2016-04-25 DIAGNOSIS — M9905 Segmental and somatic dysfunction of pelvic region: Secondary | ICD-10-CM | POA: Diagnosis not present

## 2016-04-25 DIAGNOSIS — M461 Sacroiliitis, not elsewhere classified: Secondary | ICD-10-CM | POA: Diagnosis not present

## 2016-04-25 DIAGNOSIS — M9904 Segmental and somatic dysfunction of sacral region: Secondary | ICD-10-CM | POA: Diagnosis not present

## 2016-04-25 DIAGNOSIS — M5136 Other intervertebral disc degeneration, lumbar region: Secondary | ICD-10-CM | POA: Diagnosis not present

## 2016-04-25 DIAGNOSIS — M50321 Other cervical disc degeneration at C4-C5 level: Secondary | ICD-10-CM | POA: Diagnosis not present

## 2016-04-25 DIAGNOSIS — M9901 Segmental and somatic dysfunction of cervical region: Secondary | ICD-10-CM | POA: Diagnosis not present

## 2016-04-25 DIAGNOSIS — M25551 Pain in right hip: Secondary | ICD-10-CM | POA: Diagnosis not present

## 2016-04-30 DIAGNOSIS — M9905 Segmental and somatic dysfunction of pelvic region: Secondary | ICD-10-CM | POA: Diagnosis not present

## 2016-04-30 DIAGNOSIS — M9904 Segmental and somatic dysfunction of sacral region: Secondary | ICD-10-CM | POA: Diagnosis not present

## 2016-04-30 DIAGNOSIS — M461 Sacroiliitis, not elsewhere classified: Secondary | ICD-10-CM | POA: Diagnosis not present

## 2016-04-30 DIAGNOSIS — M9903 Segmental and somatic dysfunction of lumbar region: Secondary | ICD-10-CM | POA: Diagnosis not present

## 2016-04-30 DIAGNOSIS — M9902 Segmental and somatic dysfunction of thoracic region: Secondary | ICD-10-CM | POA: Diagnosis not present

## 2016-04-30 DIAGNOSIS — M5136 Other intervertebral disc degeneration, lumbar region: Secondary | ICD-10-CM | POA: Diagnosis not present

## 2016-04-30 DIAGNOSIS — M50321 Other cervical disc degeneration at C4-C5 level: Secondary | ICD-10-CM | POA: Diagnosis not present

## 2016-04-30 DIAGNOSIS — M9901 Segmental and somatic dysfunction of cervical region: Secondary | ICD-10-CM | POA: Diagnosis not present

## 2016-04-30 DIAGNOSIS — M25551 Pain in right hip: Secondary | ICD-10-CM | POA: Diagnosis not present

## 2016-05-02 DIAGNOSIS — M9902 Segmental and somatic dysfunction of thoracic region: Secondary | ICD-10-CM | POA: Diagnosis not present

## 2016-05-02 DIAGNOSIS — M25551 Pain in right hip: Secondary | ICD-10-CM | POA: Diagnosis not present

## 2016-05-02 DIAGNOSIS — M5136 Other intervertebral disc degeneration, lumbar region: Secondary | ICD-10-CM | POA: Diagnosis not present

## 2016-05-02 DIAGNOSIS — M9901 Segmental and somatic dysfunction of cervical region: Secondary | ICD-10-CM | POA: Diagnosis not present

## 2016-05-02 DIAGNOSIS — M9904 Segmental and somatic dysfunction of sacral region: Secondary | ICD-10-CM | POA: Diagnosis not present

## 2016-05-02 DIAGNOSIS — M9905 Segmental and somatic dysfunction of pelvic region: Secondary | ICD-10-CM | POA: Diagnosis not present

## 2016-05-02 DIAGNOSIS — M9903 Segmental and somatic dysfunction of lumbar region: Secondary | ICD-10-CM | POA: Diagnosis not present

## 2016-05-02 DIAGNOSIS — M461 Sacroiliitis, not elsewhere classified: Secondary | ICD-10-CM | POA: Diagnosis not present

## 2016-05-02 DIAGNOSIS — M50321 Other cervical disc degeneration at C4-C5 level: Secondary | ICD-10-CM | POA: Diagnosis not present

## 2016-05-09 DIAGNOSIS — M25551 Pain in right hip: Secondary | ICD-10-CM | POA: Diagnosis not present

## 2016-05-09 DIAGNOSIS — M9903 Segmental and somatic dysfunction of lumbar region: Secondary | ICD-10-CM | POA: Diagnosis not present

## 2016-05-09 DIAGNOSIS — M9904 Segmental and somatic dysfunction of sacral region: Secondary | ICD-10-CM | POA: Diagnosis not present

## 2016-05-09 DIAGNOSIS — M9902 Segmental and somatic dysfunction of thoracic region: Secondary | ICD-10-CM | POA: Diagnosis not present

## 2016-05-09 DIAGNOSIS — M50321 Other cervical disc degeneration at C4-C5 level: Secondary | ICD-10-CM | POA: Diagnosis not present

## 2016-05-09 DIAGNOSIS — M5136 Other intervertebral disc degeneration, lumbar region: Secondary | ICD-10-CM | POA: Diagnosis not present

## 2016-05-09 DIAGNOSIS — M9901 Segmental and somatic dysfunction of cervical region: Secondary | ICD-10-CM | POA: Diagnosis not present

## 2016-05-09 DIAGNOSIS — M9905 Segmental and somatic dysfunction of pelvic region: Secondary | ICD-10-CM | POA: Diagnosis not present

## 2016-05-09 DIAGNOSIS — M461 Sacroiliitis, not elsewhere classified: Secondary | ICD-10-CM | POA: Diagnosis not present

## 2016-05-14 DIAGNOSIS — M50321 Other cervical disc degeneration at C4-C5 level: Secondary | ICD-10-CM | POA: Diagnosis not present

## 2016-05-14 DIAGNOSIS — M9901 Segmental and somatic dysfunction of cervical region: Secondary | ICD-10-CM | POA: Diagnosis not present

## 2016-05-14 DIAGNOSIS — M25551 Pain in right hip: Secondary | ICD-10-CM | POA: Diagnosis not present

## 2016-05-14 DIAGNOSIS — M9903 Segmental and somatic dysfunction of lumbar region: Secondary | ICD-10-CM | POA: Diagnosis not present

## 2016-05-14 DIAGNOSIS — M9905 Segmental and somatic dysfunction of pelvic region: Secondary | ICD-10-CM | POA: Diagnosis not present

## 2016-05-14 DIAGNOSIS — M9902 Segmental and somatic dysfunction of thoracic region: Secondary | ICD-10-CM | POA: Diagnosis not present

## 2016-05-14 DIAGNOSIS — M5136 Other intervertebral disc degeneration, lumbar region: Secondary | ICD-10-CM | POA: Diagnosis not present

## 2016-05-14 DIAGNOSIS — M9904 Segmental and somatic dysfunction of sacral region: Secondary | ICD-10-CM | POA: Diagnosis not present

## 2016-05-14 DIAGNOSIS — M461 Sacroiliitis, not elsewhere classified: Secondary | ICD-10-CM | POA: Diagnosis not present

## 2016-05-21 DIAGNOSIS — M461 Sacroiliitis, not elsewhere classified: Secondary | ICD-10-CM | POA: Diagnosis not present

## 2016-05-21 DIAGNOSIS — M50321 Other cervical disc degeneration at C4-C5 level: Secondary | ICD-10-CM | POA: Diagnosis not present

## 2016-05-21 DIAGNOSIS — M5136 Other intervertebral disc degeneration, lumbar region: Secondary | ICD-10-CM | POA: Diagnosis not present

## 2016-05-21 DIAGNOSIS — M9901 Segmental and somatic dysfunction of cervical region: Secondary | ICD-10-CM | POA: Diagnosis not present

## 2016-05-21 DIAGNOSIS — M9903 Segmental and somatic dysfunction of lumbar region: Secondary | ICD-10-CM | POA: Diagnosis not present

## 2016-05-21 DIAGNOSIS — M9904 Segmental and somatic dysfunction of sacral region: Secondary | ICD-10-CM | POA: Diagnosis not present

## 2016-05-21 DIAGNOSIS — M9905 Segmental and somatic dysfunction of pelvic region: Secondary | ICD-10-CM | POA: Diagnosis not present

## 2016-05-21 DIAGNOSIS — M9902 Segmental and somatic dysfunction of thoracic region: Secondary | ICD-10-CM | POA: Diagnosis not present

## 2016-05-21 DIAGNOSIS — M25551 Pain in right hip: Secondary | ICD-10-CM | POA: Diagnosis not present

## 2016-05-23 DIAGNOSIS — M25551 Pain in right hip: Secondary | ICD-10-CM | POA: Diagnosis not present

## 2016-05-23 DIAGNOSIS — M9905 Segmental and somatic dysfunction of pelvic region: Secondary | ICD-10-CM | POA: Diagnosis not present

## 2016-05-23 DIAGNOSIS — M50321 Other cervical disc degeneration at C4-C5 level: Secondary | ICD-10-CM | POA: Diagnosis not present

## 2016-05-23 DIAGNOSIS — M461 Sacroiliitis, not elsewhere classified: Secondary | ICD-10-CM | POA: Diagnosis not present

## 2016-05-23 DIAGNOSIS — M9901 Segmental and somatic dysfunction of cervical region: Secondary | ICD-10-CM | POA: Diagnosis not present

## 2016-05-23 DIAGNOSIS — M9904 Segmental and somatic dysfunction of sacral region: Secondary | ICD-10-CM | POA: Diagnosis not present

## 2016-05-23 DIAGNOSIS — M5136 Other intervertebral disc degeneration, lumbar region: Secondary | ICD-10-CM | POA: Diagnosis not present

## 2016-05-23 DIAGNOSIS — M9902 Segmental and somatic dysfunction of thoracic region: Secondary | ICD-10-CM | POA: Diagnosis not present

## 2016-05-23 DIAGNOSIS — M9903 Segmental and somatic dysfunction of lumbar region: Secondary | ICD-10-CM | POA: Diagnosis not present

## 2016-05-29 ENCOUNTER — Other Ambulatory Visit: Payer: Self-pay | Admitting: Family Medicine

## 2016-06-04 ENCOUNTER — Other Ambulatory Visit: Payer: Self-pay | Admitting: Family Medicine

## 2016-06-04 NOTE — Telephone Encounter (Signed)
Last office visit 06/29/2015.   AVS states to follow up in 3 months for Medicare Wellness.  [name] [name] contacted patient on 04/12/2016 to try and schedule patient and she states she didn't see the value at this time and would call when needed.  No future appointments.  Refill?

## 2016-06-04 NOTE — Telephone Encounter (Signed)
Pt need appt for AMW or CPX before further refills

## 2016-06-12 ENCOUNTER — Other Ambulatory Visit: Payer: Self-pay | Admitting: Family Medicine

## 2016-08-13 DIAGNOSIS — E7800 Pure hypercholesterolemia, unspecified: Secondary | ICD-10-CM | POA: Diagnosis not present

## 2016-08-13 DIAGNOSIS — E1165 Type 2 diabetes mellitus with hyperglycemia: Secondary | ICD-10-CM | POA: Diagnosis not present

## 2016-08-13 DIAGNOSIS — I10 Essential (primary) hypertension: Secondary | ICD-10-CM | POA: Diagnosis not present

## 2016-08-13 DIAGNOSIS — R740 Nonspecific elevation of levels of transaminase and lactic acid dehydrogenase [LDH]: Secondary | ICD-10-CM | POA: Diagnosis not present

## 2016-08-15 ENCOUNTER — Other Ambulatory Visit: Payer: Self-pay | Admitting: Family Medicine

## 2016-08-20 DIAGNOSIS — E1165 Type 2 diabetes mellitus with hyperglycemia: Secondary | ICD-10-CM | POA: Diagnosis not present

## 2016-09-17 ENCOUNTER — Other Ambulatory Visit: Payer: Self-pay | Admitting: Family Medicine

## 2016-09-17 NOTE — Telephone Encounter (Signed)
Last office visit 06/29/15.  Was advised on last refill that she needs an appointment for any additional refills.  No future appointments.  Refill?

## 2016-09-20 ENCOUNTER — Ambulatory Visit (INDEPENDENT_AMBULATORY_CARE_PROVIDER_SITE_OTHER): Payer: Medicare Other | Admitting: Family Medicine

## 2016-09-20 ENCOUNTER — Encounter: Payer: Self-pay | Admitting: Family Medicine

## 2016-09-20 VITALS — BP 100/60 | HR 64 | Temp 98.4°F | Ht 65.0 in | Wt 231.5 lb

## 2016-09-20 DIAGNOSIS — E7800 Pure hypercholesterolemia, unspecified: Secondary | ICD-10-CM | POA: Diagnosis not present

## 2016-09-20 DIAGNOSIS — E1165 Type 2 diabetes mellitus with hyperglycemia: Secondary | ICD-10-CM | POA: Diagnosis not present

## 2016-09-20 DIAGNOSIS — Z23 Encounter for immunization: Secondary | ICD-10-CM

## 2016-09-20 DIAGNOSIS — R002 Palpitations: Secondary | ICD-10-CM | POA: Diagnosis not present

## 2016-09-20 LAB — HM DIABETES FOOT EXAM

## 2016-09-20 MED ORDER — VERAPAMIL HCL ER 120 MG PO TBCR: 120.0000 mg | EXTENDED_RELEASE_TABLET | Freq: Every day | ORAL | 5 refills | Status: DC

## 2016-09-20 MED ORDER — METOPROLOL TARTRATE 50 MG PO TABS: 50.0000 mg | ORAL_TABLET | Freq: Two times a day (BID) | ORAL | 5 refills | Status: DC

## 2016-09-20 NOTE — Patient Instructions (Addendum)
Set up eye exam yearly as able.  We will get records from [facility]O.  Stop metoprolol . Try verapamil 120 mg once daily for SVT instead. Follow blood pressure at home.

## 2016-09-20 NOTE — Assessment & Plan Note (Signed)
Likely inadequate control. Will get records from ENDO.

## 2016-09-20 NOTE — Assessment & Plan Note (Signed)
SVT.  Pt concerned about SE from metoprolol. Will try trial of verapamil for rate control instead. Follow up in 2 months.. If not improving.Marland Kitchen Refer back to cardiology.

## 2016-09-20 NOTE — Progress Notes (Signed)
Subjective:    Patient ID: [name] [name], female    DOB: 05/04/1946, 71 y.o.   MRN: [id number]  HPI   71 year old female presents for medication refill.  Last OV was 2016.  Due for CPX.  Diabetes:   Due for re-eval. Using 70/30 70/25/55 units.  Followed by Dr. Chalmers Cater, [facility] Lab Results  Component Value Date   HGBA1C 9.9 (H) 01/06/2014  Using medications without difficulties: Hypoglycemic episodes: none Hyperglycemic episodes: yes Feet problems: pain issues, no ulcers Blood Sugars averaging: FBS 94-139,  At night: occ 200-300 eye exam within last year: due  Elevated Cholesterol: done at [facility]O  Refuses statin medication, SE. Diet compliance: poor Exercise: none due to feet. Other complaints:  SVT:  Good control of BP but still palpitations.. Needs refill of metoprolol but does not feel it is helping enough,but she is also concerned about SE to the med.. Tremor and ringing in ears. Has never tried verapamil.   BP Readings from Last 3 Encounters:  09/20/16 100/60  08/05/15 161/68  07/28/15 (!) 137/56  Using medication without problems or lightheadedness:  None Chest pain with exertion: None Edema:stable from foot operation. Short of breath: none Average home BPs:  Not checking. Other issues:   Having trigger finger in ring finger on left.  Plans to call Dr. Amedeo Plenty when ready.   Blood pressure 100/60, pulse 64, temperature 98.4 F (36.9 C), temperature source Oral, height 5' 5"  (1.651 m), weight 231 lb 8 oz (105 kg).  Review of Systems  Constitutional: Negative for fatigue and fever.  HENT: Negative for ear pain.   Eyes: Negative for pain.  Respiratory: Negative for chest tightness and shortness of breath.   Cardiovascular: Positive for palpitations. Negative for chest pain and leg swelling.  Gastrointestinal: Negative for abdominal pain.  Genitourinary: Negative for dysuria.       Objective:   Physical Exam  Constitutional: Vital signs are normal. She  appears well-developed and well-nourished. She is cooperative.  Non-toxic appearance. She does not appear ill. No distress.  obese  HENT:  Head: Normocephalic.  Right Ear: Hearing, tympanic membrane, external ear and ear canal normal. Tympanic membrane is not erythematous, not retracted and not bulging.  Left Ear: Hearing, tympanic membrane, external ear and ear canal normal. Tympanic membrane is not erythematous, not retracted and not bulging.  Nose: No mucosal edema or rhinorrhea. Right sinus exhibits no maxillary sinus tenderness and no frontal sinus tenderness. Left sinus exhibits no maxillary sinus tenderness and no frontal sinus tenderness.  Mouth/Throat: Uvula is midline, oropharynx is clear and moist and mucous membranes are normal.  Eyes: Conjunctivae, EOM and lids are normal. Pupils are equal, round, and reactive to light. Lids are everted and swept, no foreign bodies found.  Neck: Trachea normal and normal range of motion. Neck supple. Carotid bruit is not present. No thyroid mass and no thyromegaly present.  Cardiovascular: Normal rate, regular rhythm, S1 normal, S2 normal, normal heart sounds, intact distal pulses and normal pulses.  Exam reveals no gallop and no friction rub.   No murmur heard. Pulmonary/Chest: Effort normal and breath sounds normal. No tachypnea. No respiratory distress. She has no decreased breath sounds. She has no wheezes. She has no rhonchi. She has no rales.  Abdominal: Soft. Normal appearance and bowel sounds are normal. There is no tenderness.  Neurological: She is alert.  Skin: Skin is warm, dry and intact. No rash noted.  Psychiatric: Her speech is normal and behavior is normal.  Judgment and thought content normal. Her mood appears not anxious. Cognition and memory are normal. She does not exhibit a depressed mood.     Diabetic foot exam: Normal inspection No skin breakdown No calluses  Normal DP pulses Normal sensation to light touch and  monofilament Nails normal      Assessment & Plan:

## 2016-09-20 NOTE — Progress Notes (Signed)
Pre visit review using our clinic review tool, if applicable. No additional management support is needed unless otherwise documented below in the visit note. 

## 2016-09-20 NOTE — Assessment & Plan Note (Signed)
Will get last check from ENDO to determine control. Pt refuses statin.

## 2016-12-03 ENCOUNTER — Telehealth: Payer: Self-pay | Admitting: Family Medicine

## 2016-12-03 DIAGNOSIS — E1165 Type 2 diabetes mellitus with hyperglycemia: Secondary | ICD-10-CM

## 2016-12-03 DIAGNOSIS — E559 Vitamin D deficiency, unspecified: Secondary | ICD-10-CM

## 2016-12-03 DIAGNOSIS — Z1159 Encounter for screening for other viral diseases: Secondary | ICD-10-CM

## 2016-12-03 NOTE — Telephone Encounter (Signed)
-----   Message from Eustace Pen, LPN sent at 9/43/7005  4:15 PM EDT ----- Regarding: Labs 5/16 Please add Hep C to lab orders. Thank you.   [employer]

## 2016-12-04 ENCOUNTER — Ambulatory Visit: Payer: Medicare Other

## 2016-12-10 ENCOUNTER — Encounter: Payer: Medicare Other | Admitting: Family Medicine

## 2016-12-18 DIAGNOSIS — M791 Myalgia: Secondary | ICD-10-CM | POA: Diagnosis not present

## 2016-12-18 DIAGNOSIS — E7800 Pure hypercholesterolemia, unspecified: Secondary | ICD-10-CM | POA: Diagnosis not present

## 2016-12-18 DIAGNOSIS — I10 Essential (primary) hypertension: Secondary | ICD-10-CM | POA: Diagnosis not present

## 2016-12-18 DIAGNOSIS — E1165 Type 2 diabetes mellitus with hyperglycemia: Secondary | ICD-10-CM | POA: Diagnosis not present

## 2016-12-18 DIAGNOSIS — R609 Edema, unspecified: Secondary | ICD-10-CM | POA: Diagnosis not present

## 2016-12-18 DIAGNOSIS — R740 Nonspecific elevation of levels of transaminase and lactic acid dehydrogenase [LDH]: Secondary | ICD-10-CM | POA: Diagnosis not present

## 2016-12-19 LAB — MICROALBUMIN, URINE: MICROALB UR: 4.5

## 2016-12-20 ENCOUNTER — Telehealth: Payer: Self-pay

## 2016-12-20 NOTE — Telephone Encounter (Signed)
Pt was seen by Dr Chalmers Cater 12/19/16; pt got call from Dr Almetta Lovely office and was advised liver function test were high.pt takes verapamil for the last 3-4 months and pt found that verapamil can effect the liver function. Also pt has lots of fluid all over and pt has SOB on and off; pt has not been taking the furosemide 20 mg daily just using prn. Dr Chalmers Cater advised pt to take furosemide 40 mg every other day due to concern K will go low.(Dr Chalmers Cater sent new rx to pharmacy per pt).[facility].pt scheduled appt with Dr Diona Browner on 12/24/16 at 9:15;if pt condition worsens prior to appt she will go to ED. FYI to Dr Diona Browner. (pt did say Dr Almetta Lovely office is going to fax copy of labs to Dr Diona Browner.)

## 2016-12-24 ENCOUNTER — Encounter: Payer: Self-pay | Admitting: Family Medicine

## 2016-12-24 ENCOUNTER — Ambulatory Visit (INDEPENDENT_AMBULATORY_CARE_PROVIDER_SITE_OTHER): Payer: Medicare Other | Admitting: Family Medicine

## 2016-12-24 ENCOUNTER — Telehealth: Payer: Self-pay | Admitting: Family Medicine

## 2016-12-24 VITALS — BP 120/70 | HR 82 | Temp 98.4°F | Ht 65.0 in | Wt 226.0 lb

## 2016-12-24 DIAGNOSIS — R7989 Other specified abnormal findings of blood chemistry: Secondary | ICD-10-CM

## 2016-12-24 DIAGNOSIS — R609 Edema, unspecified: Secondary | ICD-10-CM | POA: Diagnosis not present

## 2016-12-24 DIAGNOSIS — R945 Abnormal results of liver function studies: Principal | ICD-10-CM

## 2016-12-24 DIAGNOSIS — R0602 Shortness of breath: Secondary | ICD-10-CM

## 2016-12-24 DIAGNOSIS — I471 Supraventricular tachycardia: Secondary | ICD-10-CM | POA: Diagnosis not present

## 2016-12-24 LAB — COMPREHENSIVE METABOLIC PANEL
ALBUMIN: 3.8 g/dL (ref 3.5–5.2)
ALT: 143 U/L — ABNORMAL HIGH (ref 0–35)
AST: 115 U/L — ABNORMAL HIGH (ref 0–37)
Alkaline Phosphatase: 117 U/L (ref 39–117)
BUN: 17 mg/dL (ref 6–23)
CHLORIDE: 105 meq/L (ref 96–112)
CO2: 30 mEq/L (ref 19–32)
Calcium: 9.8 mg/dL (ref 8.4–10.5)
Creatinine, Ser: 0.8 mg/dL (ref 0.40–1.20)
GFR: 75.19 mL/min (ref >60.00)
Glucose, Bld: 180 mg/dL — ABNORMAL HIGH (ref 70–99)
POTASSIUM: 4.5 meq/L (ref 3.5–5.1)
SODIUM: 139 meq/L (ref 135–145)
Total Bilirubin: 0.6 mg/dL (ref 0.2–1.2)
Total Protein: 7.1 g/dL (ref 6.0–8.3)

## 2016-12-24 LAB — BRAIN NATRIURETIC PEPTIDE: Pro B Natriuretic peptide (BNP): 22 pg/mL (ref 0.0–100.0)

## 2016-12-24 MED ORDER — METOPROLOL TARTRATE 50 MG PO TABS: 50.0000 mg | ORAL_TABLET | Freq: Two times a day (BID) | ORAL | 3 refills | Status: DC

## 2016-12-24 NOTE — Telephone Encounter (Signed)
Let pt know metoprolol called in.

## 2016-12-24 NOTE — Patient Instructions (Addendum)
Stop verapamil.  Restart metopprol for now.  We will get you an appt with Dr. Cathie Olden.   Please stop at the lab to set up to have labs drawn.  Continue lasix 40 mg daily for now.  Return in 2 weeks for repeat liver function test.

## 2016-12-24 NOTE — Telephone Encounter (Signed)
Pt left message on triage phone, she saw PCP today and it was recommended that she go back on Metoprolol.  She thought she had some medication, but she does not have any more.  Best number to call pt is [phone]  She would like sent to [address] [phone]

## 2016-12-24 NOTE — Progress Notes (Signed)
Subjective:    Patient ID: [name] [name], female    DOB: 1946-07-13, 71 y.o.   MRN: [id number]  HPI   71 year old female presents to discuss recent lab tests.   She was noted to have increase LFTs on labs at  ENDO office. 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  Last LFTs here 2013 AST 109, ALT 117 2016 CT abd: Enlargement of caudate and left hepatic lobes noted, suspicious for hepatic cirrhosis. No liver masses visualized on this noncontrast study. Prior cholecystectomy noted. No evidence of biliary dilatation.  No abdominal pain.  no ETOH, no tylenol.  She has noted increase in SOB in last 4 months. No new chest pain.  She has had increase in swelling in last 4 months since changing to verapamil... Dr. Chalmers Cater instructed her to change furosemide 20 mg prn to 40 mg daily in last 5 days.  She has lost 12 lbs on this.. She has improved SOB.  Wt Readings from Last 3 Encounters:  12/24/16 226 lb (102.5 kg)  09/20/16 231 lb 8 oz (105 kg)  08/05/15 210 lb (95.3 kg)    On verapamil for SVT.Marland Kitchen Changed from metoprolol to this per possible SE.  In past has seen Dr.Nasher for SVT in 2013. ECHO at that time: nml EF, moderate diastolic dysfunction.  BP Readings from Last 3 Encounters:  12/24/16 120/70  09/20/16 100/60  08/05/15 161/68    Blood pressure 120/70, pulse 82, temperature 98.4 F (36.9 C), temperature source Oral, height 5' 5"  (1.651 m), weight 226 lb (102.5 kg).   Review of Systems  Constitutional: Positive for fatigue. Negative for fever.  HENT: Negative for ear pain.   Eyes: Negative for pain.  Respiratory: Positive for shortness of breath. Negative for cough and wheezing.   Cardiovascular: Positive for leg swelling. Negative for chest pain and palpitations.  Gastrointestinal: Positive for abdominal distention and constipation. Negative for abdominal pain, anal bleeding, blood in stool, diarrhea and  nausea.       Objective:   Physical Exam  Constitutional: Vital signs are normal. She appears well-developed and well-nourished. She is cooperative.  Non-toxic appearance. She does not appear ill. No distress.  Morbidly obese  HENT:  Head: Normocephalic.  Right Ear: Hearing, tympanic membrane, external ear and ear canal normal. Tympanic membrane is not erythematous, not retracted and not bulging.  Left Ear: Hearing, tympanic membrane, external ear and ear canal normal. Tympanic membrane is not erythematous, not retracted and not bulging.  Nose: No mucosal edema or rhinorrhea. Right sinus exhibits no maxillary sinus tenderness and no frontal sinus tenderness. Left sinus exhibits no maxillary sinus tenderness and no frontal sinus tenderness.  Mouth/Throat: Uvula is midline, oropharynx is clear and moist and mucous membranes are normal.  Eyes: Conjunctivae, EOM and lids are normal. Pupils are equal, round, and reactive to light. Lids are everted and swept, no foreign bodies found.  Neck: Trachea normal and normal range of motion. Neck supple. Carotid bruit is not present. No thyroid mass and no thyromegaly present.  Cardiovascular: Normal rate, regular rhythm, S1 normal, S2 normal, normal heart sounds, intact distal pulses and normal pulses.  Exam reveals no gallop and no friction rub.   No murmur heard. Pulmonary/Chest: Effort normal and breath sounds normal. No tachypnea. No respiratory distress. She has no decreased breath sounds. She has no wheezes. She has no rhonchi. She has no rales.  Abdominal: Soft. Normal appearance and bowel sounds are normal. There is no hepatosplenomegaly, splenomegaly or hepatomegaly. There is no tenderness. There is no rebound and no CVA tenderness. No hernia. Hernia confirmed negative in the ventral area.  Liver exam difficult given central obesity but not grossly enlarged... No periumbilical varicies or other liver stigmata  Neurological: She is alert.  Skin: Skin  is warm, dry and intact. No rash noted.  Psychiatric: Her speech is normal and behavior is normal. Judgment and thought content normal. Her mood appears not anxious. Cognition and memory are normal. She does not exhibit a depressed mood.          Assessment & Plan:

## 2016-12-24 NOTE — Assessment & Plan Note (Addendum)
In past seen by liver MD.. Pt has not seen in many years. ? Autoimmune/NASH as diagnosis.  Some possible cirrhosis seen on 2016 CT abd.   LFTs have increased since last check in 2013... May be with new start verapamil or progression of liver issue.  Recommend stopping verapamil. Recehck LFTs in 2 week.  Pt likely also needs re imaging of liver and referral back to liver specialist,  but  She refuses at this time.

## 2016-12-24 NOTE — Assessment & Plan Note (Signed)
Multifactorial. Improved on lasix 40 mg daily. Not on KCL.  Check Cr and K today on this dose. Likely will need K supplementation.

## 2016-12-24 NOTE — Assessment & Plan Note (Signed)
SE to bblocker, CA channel blockers.  Will temporarily change back to metoprolol but refer to Cards for long term recommendations.  Check BNP,  But no clear sign of new cardiac issue per pt's symptoms.  Most likely due to  Combination of diastolic dysfunction, SE to verapamil and liver dysfunction.

## 2016-12-24 NOTE — Telephone Encounter (Signed)
Spoke to pt

## 2016-12-26 ENCOUNTER — Other Ambulatory Visit: Payer: Self-pay | Admitting: Family Medicine

## 2016-12-26 DIAGNOSIS — R7989 Other specified abnormal findings of blood chemistry: Secondary | ICD-10-CM

## 2016-12-26 DIAGNOSIS — R945 Abnormal results of liver function studies: Principal | ICD-10-CM

## 2017-01-03 ENCOUNTER — Encounter: Payer: Self-pay | Admitting: Physician Assistant

## 2017-01-03 ENCOUNTER — Ambulatory Visit (INDEPENDENT_AMBULATORY_CARE_PROVIDER_SITE_OTHER): Payer: Medicare Other | Admitting: Physician Assistant

## 2017-01-03 VITALS — BP 150/70 | HR 66 | Ht 64.5 in | Wt 223.0 lb

## 2017-01-03 DIAGNOSIS — I471 Supraventricular tachycardia: Secondary | ICD-10-CM | POA: Diagnosis not present

## 2017-01-03 DIAGNOSIS — R7401 Elevation of levels of liver transaminase levels: Secondary | ICD-10-CM

## 2017-01-03 DIAGNOSIS — R600 Localized edema: Secondary | ICD-10-CM | POA: Diagnosis not present

## 2017-01-03 DIAGNOSIS — R0602 Shortness of breath: Secondary | ICD-10-CM

## 2017-01-03 DIAGNOSIS — E119 Type 2 diabetes mellitus without complications: Secondary | ICD-10-CM | POA: Diagnosis not present

## 2017-01-03 DIAGNOSIS — Z794 Long term (current) use of insulin: Secondary | ICD-10-CM

## 2017-01-03 DIAGNOSIS — E785 Hyperlipidemia, unspecified: Secondary | ICD-10-CM | POA: Diagnosis not present

## 2017-01-03 DIAGNOSIS — G4733 Obstructive sleep apnea (adult) (pediatric): Secondary | ICD-10-CM

## 2017-01-03 DIAGNOSIS — R740 Nonspecific elevation of levels of transaminase and lactic acid dehydrogenase [LDH]: Secondary | ICD-10-CM | POA: Diagnosis not present

## 2017-01-03 DIAGNOSIS — Z9989 Dependence on other enabling machines and devices: Secondary | ICD-10-CM | POA: Diagnosis not present

## 2017-01-03 MED ORDER — ATENOLOL 25 MG PO TABS: 25.0000 mg | ORAL_TABLET | Freq: Every day | ORAL | 11 refills | Status: DC

## 2017-01-03 NOTE — Progress Notes (Signed)
Cardiology Office Note    Date:  01/04/2017   ID:  [name] [name], DOB 1945-12-29, MRN [id number]  PCP:  Jinny Sanders, MD  Cardiologist:  Patient wish to establish new cardiologist. Seen with Dr. Gwenlyn Found. Previously Dr. Acie Fredrickson in 2013  Chief Complaint  Patient presents with  . New Evaluation    previously seen by Dr. Acie Fredrickson in 2013. Patient wish to establish with new cardiologist. Seen with Dr. Gwenlyn Found    History of Present Illness:  [name] [name] is a 71 y.o. female with PMH of hyperlipidemia, OSA on CPAP, type 2 diabetes and history of SVT. He used to see Dr. Letta Pate in 2013. Echocardiogram obtained on 12/03/2011 showed EF 84-53%, grade 2 diastolic dysfunction, PA peak pressure 31 mmHg. TSH was normal on 11/20/2011. She also had an event monitor in June 2013 that showed sinus rhythm with PACs and PVCs. It was recommended for her to follow-up on as-needed basis. She is intolerant to metoprolol, his primary care physician has switched his metoprolol to verapamil 120 mg daily for control for SVT instead in March 2018. However based on the most recent note in June 2018, she was changed back to metoprolol after his AST and ALT were elevated.  She presents today for cardiology office evaluation. She continued to have almost nightly episodes of palpitation. She says the main reason She was switched to verapamil was because metoprolol was interfering with her memory. After she was switched to verapamil, her memory did improve. However ever since she was restarted on metoprolol, her memory issue came back again. She is no longer taking metoprolol on a daily basis, she says she is only taking it as needed. The last time she took it was 2 nights ago. I discussed with our clinical pharmacist, both verapamil and diltiazem primarily metabolized in the liver. It is still unclear what is causing her elevated transaminases. However looking back, her AST and ALT has been elevated for the past 5 years based on  the previous lab work. She has been seen by Dr. Gwenlyn Found during today's office visit as well, we recommended she continue to follow-up with her primary care physician and I will defer additional workup such as hepatitis panel to her primary care physician. I will discontinue metoprolol and switch to atenolol which has the lowest probability of crossing the blood brain barrier.    Past Medical History:  Diagnosis Date  . Abnormal abdominal CT scan 04/2006   slight abn liver  . Abnormal chest CT 10/07   unable to bx  . Abnormal CT scan, pancreas or bile duct 04/2006   cyst  . Acute cystitis   . Acute pharyngitis   . Allergic rhinitis, cause unspecified   . Arthritis    hands, feet and back  . Benign neoplasm of colon   . Cyst and pseudocyst of pancreas   . Depressive disorder, not elsewhere classified   . Diverticulitis   . Diverticulosis of colon (without mention of hemorrhage)   . Dysthymic disorder   . Dysuria   . Edema   . Esophageal reflux   . Essential and other specified forms of tremor   . Irritable bowel syndrome   . Obesity, unspecified   . Other diseases of lung, not elsewhere classified   . Other specified cardiac dysrhythmias(427.89)   . Pain in joint, lower leg   . Periapical abscess without sinus   . Polyneuropathy in diabetes(357.2)   . Pure hypercholesterolemia   . Sleep  apnea    CPAP- non compliant  . Type II or unspecified type diabetes mellitus with neurological manifestations, not stated as uncontrolled(250.60)   . Type II or unspecified type diabetes mellitus without mention of complication, not stated as uncontrolled   . Unspecified disorder of liver   . Unspecified sleep apnea   . Unspecified urinary incontinence     Past Surgical History:  Procedure Laterality Date  . ABDOMINAL HYSTERECTOMY    . CALCANEAL OSTEOTOMY Right 07/27/2015   Procedure: RIGHT CALCANEAL OSTEOTOMY ;  Surgeon: Wylene Simmer, MD;  Location: [facility];  Service:  Orthopedics;  Laterality: Right;  . CHOLECYSTECTOMY     15-20 years ago  . GASTROCNEMIUS RECESSION Right 07/27/2015   Procedure: RIGHT GASTROC RECESSION;  Surgeon: Wylene Simmer, MD;  Location: [facility];  Service: Orthopedics;  Laterality: Right;  . KIDNEY STONE SURGERY  1980's   removal  . 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 PARTIAL HYSTERECTOMY  1990's  . ROTATOR CUFF REPAIR  2005    Current Medications: Outpatient Medications Prior to Visit  Medication Sig Dispense Refill  . aspirin 81 MG tablet Take 81 mg by mouth daily.    . Calcium Carbonate-Vitamin D (CALCIUM + D PO) Take 1,200 mg by mouth 2 (two) times daily.     . famotidine (PEPCID) 20 MG tablet Take 20 mg by mouth daily.    Marland Kitchen NEXIUM 40 MG capsule TAKE 1 CAPSULE BY MOUTH DAILY 90 capsule 1  . nitrofurantoin (MACRODANTIN) 100 MG capsule START WITH 1 CAPSULE TWO TIMES A DAY FOR UTI THEN CHANGE TO 1 CAPSULE DAILY FOR PREVENTION 30 capsule 10  . NOVOLIN 70/30 RELION (70-30) 100 UNIT/ML injection 70 units in the morning, 25 units at lunch and 55 units at dinner    . fluticasone (FLONASE) 50 MCG/ACT nasal spray 1-2 puffs each nostril, once or twice daily for allergy 16 g prn  . Insulin Pen Needle (B-D ULTRAFINE III SHORT PEN) 31G X 8 MM MISC Use to inject insulin two times a day.  Dx: E11.9 100 each 0  . furosemide (LASIX) 20 MG tablet TAKE 1 TABLET BY MOUTH EVERY DAY **MUST SCHEDULE ANNUAL EXAM!** 30 tablet 0  . metoprolol tartrate (LOPRESSOR) 50 MG tablet Take 1 tablet (50 mg total) by mouth 2 (two) times daily. (Patient not taking: Reported on 01/03/2017) 60 tablet 3  . ONE TOUCH ULTRA TEST test strip USE 2 TIMES A DAY (DIAG CODE 250.00) 100 each 11  . ONETOUCH DELICA LANCETS FINE MISC   6  . temazepam (RESTORIL) 15 MG capsule Take 1-2 capsules (15-30 mg total) by mouth at bedtime as needed. 30 capsule 5   No facility-administered medications prior to visit.      Allergies:   Doxycycline; Epinephrine hcl;  Erythromycin ethylsuccinate; Iohexol; and Oxycodone   Social History   Social History  . Marital status: Married    Spouse name: N/A  . Number of children: 5  . Years of education: N/A   Occupational History  .  Retired   Social History Main Topics  . Smoking status: Never Smoker  . Smokeless tobacco: Never Used  . Alcohol use No  . Drug use: No  . Sexual activity: Not Asked   Other Topics Concern  . None   Social History Narrative   Husband treated for testicular cancer, 2 cups coffee a day, no exercise     Family History:  The patient's family history  includes Cirrhosis in her father; Clotting disorder in her mother.   ROS:   Please see the history of present illness.    ROS All other systems reviewed and are negative.   PHYSICAL EXAM:   VS:  BP (!) 150/70 (BP Location: Left Arm)   Pulse 66   Ht 5' 4.5" (1.638 m)   Wt 223 lb (101.2 kg)   BMI 37.69 kg/m    GEN: Well nourished, well developed, in no acute distress  HEENT: normal  Neck: no JVD, carotid bruits, or masses Cardiac: RRR; no murmurs, rubs, or gallops,no edema  Respiratory:  clear to auscultation bilaterally, normal work of breathing GI: soft, nontender, nondistended, + BS MS: no deformity or atrophy  Skin: warm and dry, no rash Neuro:  Alert and Oriented x 3, Strength and sensation are intact Psych: euthymic mood, full affect  Wt Readings from Last 3 Encounters:  01/03/17 223 lb (101.2 kg)  12/24/16 226 lb (102.5 kg)  09/20/16 231 lb 8 oz (105 kg)      Studies/Labs Reviewed:   EKG:  EKG is ordered today.  The ekg ordered today demonstrates Normal sinus rhythm, single PACs. Otherwise no significant ST-T wave changes.  Recent Labs: 12/24/2016: ALT 143; BUN 17; Creatinine, Ser 0.80; Potassium 4.5; Pro B Natriuretic peptide (BNP) 22.0; Sodium 139   Lipid Panel    Component Value Date/Time   CHOL 193 07/03/2012 1243   TRIG 175.0 (H) 07/03/2012 1243   HDL 34.60 (L) 07/03/2012 1243   CHOLHDL 6  07/03/2012 1243   VLDL 35.0 07/03/2012 1243   LDLCALC 123 (H) 07/03/2012 1243   LDLDIRECT 126.0 08/20/2010 1103    Additional studies/ records that were reviewed today include:   Echo 12/03/2011 LV EF: 60% -  65%  ------------------------------------------------------------ Indications:   Palpitations 785.1.  ------------------------------------------------------------ History:  PMH: Acquired from the patient and from the patient's chart. Palpitations and dyspnea. Risk factors: Diabetes mellitus.  ------------------------------------------------------------ Study Conclusions  - Left ventricle: The cavity size was normal. Wall thickness was normal. Systolic function was normal. The estimated ejection fraction was in the range of 60% to 65%. Wall motion was normal; there were no regional wall motion abnormalities. Features are consistent with a pseudonormal left ventricular filling pattern, with concomitant abnormal relaxation and increased filling pressure (grade 2 diastolic dysfunction). - Aortic valve: There was no stenosis. - Mitral valve: Mildly calcified annulus. Trivial regurgitation. - Left atrium: The atrium was mildly dilated. - Right ventricle: The cavity size was normal. Systolic function was normal. - Tricuspid valve: Peak RV-RA gradient: 55m Hg (S). - Pulmonary arteries: PA peak pressure: 365mHg (S). - Inferior vena cava: The vessel was normal in size; the respirophasic diameter changes were in the normal range (= 50%); findings are consistent with normal central venous pressure. Impressions:  - Normal LV size and systolic function, EF 6003-88%Moderate diastolic dysfunction. Normal RV size and systolic function. No significant valvular abnormalities.     ASSESSMENT:    1. SVT (supraventricular tachycardia) ([facility]  2. SOB (shortness of breath)   3. Bilateral lower extremity edema   4. Elevated transaminase level     5. OSA on CPAP   6. Controlled type 2 diabetes mellitus without complication, with long-term current use of insulin ([facility]  7. Hyperlipidemia, unspecified hyperlipidemia type      PLAN:  In order of problems listed above:  1. H/o SVT: Intolerant to metoprolol due to side effect causing poor memory. Unclear if recent  verapamil is causing the elevated transaminase, however verapamil and diltiazem are both metabolized in the liver extensively. After discussing with our clinical pharmacist, will switch to atenolol due to its lowest probability of crossing the blood brain barrier  2. Dyspnea: We'll obtain echocardiogram. Otherwise does not appears to be in heart failure on physical exam. Lower extremity edema largely resolved  3. Elevated transaminase: AST and ALT has been persistently elevated for the past 5 years. We'll defer further workup to the primary care provider, may need to consider hepatitis panel  4. Hyperlipidemia: No statin given her elevated transaminases  5. DM 2: On insulin  6. OSA on CPAP: Compliant with CPAP    Medication Adjustments/Labs and Tests Ordered: Current medicines are reviewed at length with the patient today.  Concerns regarding medicines are outlined above.  Medication changes, Labs and Tests ordered today are listed in the Patient Instructions below. Patient Instructions  MEDICATION CHANGE  STOP METOPROLOL   START ATENOLOL 25 MG ONE TABLET BY MOUTH DAILY.    SCHEDULE AT [facility] has requested that you have an echocardiogram. Echocardiography is a painless test that uses sound waves to create images of your heart. It provides your doctor with information about the size and shape of your heart and how well your heart's chambers and valves are working. This procedure takes approximately one hour. There are no restrictions for this procedure.    Your physician recommends that you schedule a follow-up appointment  in  3 months with DR BERRY.      Hilbert Corrigan, [place]  01/04/2017 3:58 PM    [facility] [address], [place], [place]  [address] Phone: [phone]; Fax: [phone]

## 2017-01-03 NOTE — Patient Instructions (Signed)
MEDICATION CHANGE  STOP METOPROLOL   START ATENOLOL 25 MG ONE TABLET BY MOUTH DAILY.    SCHEDULE AT [facility] has requested that you have an echocardiogram. Echocardiography is a painless test that uses sound waves to create images of your heart. It provides your doctor with information about the size and shape of your heart and how well your heart's chambers and valves are working. This procedure takes approximately one hour. There are no restrictions for this procedure.    Your physician recommends that you schedule a follow-up appointment in  3 months with DR BERRY.

## 2017-01-04 ENCOUNTER — Encounter: Payer: Self-pay | Admitting: Physician Assistant

## 2017-01-07 ENCOUNTER — Other Ambulatory Visit (INDEPENDENT_AMBULATORY_CARE_PROVIDER_SITE_OTHER): Payer: Medicare Other

## 2017-01-07 DIAGNOSIS — E1165 Type 2 diabetes mellitus with hyperglycemia: Secondary | ICD-10-CM

## 2017-01-07 DIAGNOSIS — R945 Abnormal results of liver function studies: Secondary | ICD-10-CM

## 2017-01-07 DIAGNOSIS — R7989 Other specified abnormal findings of blood chemistry: Secondary | ICD-10-CM | POA: Diagnosis not present

## 2017-01-07 DIAGNOSIS — Z1159 Encounter for screening for other viral diseases: Secondary | ICD-10-CM | POA: Diagnosis not present

## 2017-01-07 DIAGNOSIS — E559 Vitamin D deficiency, unspecified: Secondary | ICD-10-CM

## 2017-01-07 LAB — LIPID PANEL
CHOLESTEROL: 162 mg/dL (ref 0–200)
HDL: 38.2 mg/dL — AB (ref >39.00)
NonHDL: 123.89
Total CHOL/HDL Ratio: 4
Triglycerides: 203 mg/dL — ABNORMAL HIGH (ref 0.0–149.0)
VLDL: 40.6 mg/dL — AB (ref 0.0–40.0)

## 2017-01-07 LAB — HEPATIC FUNCTION PANEL
ALT: 88 U/L — ABNORMAL HIGH (ref 0–35)
AST: 65 U/L — ABNORMAL HIGH (ref 0–37)
Albumin: 3.5 g/dL (ref 3.5–5.2)
Alkaline Phosphatase: 97 U/L (ref 39–117)
Bilirubin, Direct: 0.2 mg/dL (ref 0.0–0.3)
Total Bilirubin: 0.8 mg/dL (ref 0.2–1.2)
Total Protein: 6.6 g/dL (ref 6.0–8.3)

## 2017-01-07 LAB — VITAMIN D 25 HYDROXY (VIT D DEFICIENCY, FRACTURES): VITD: 31.52 ng/mL (ref 30.00–100.00)

## 2017-01-07 LAB — HEMOGLOBIN A1C: HEMOGLOBIN A1C: 8.4 % — AB (ref 4.6–6.5)

## 2017-01-07 LAB — LDL CHOLESTEROL, DIRECT: LDL DIRECT: 91 mg/dL

## 2017-01-08 ENCOUNTER — Telehealth: Payer: Self-pay | Admitting: Physician Assistant

## 2017-01-08 LAB — HEPATITIS C ANTIBODY: HCV Ab: NEGATIVE

## 2017-01-08 NOTE — Telephone Encounter (Signed)
Patient called stating she was having problems with back pain and burning that feels similar to when her diverticulosis is aggravated. She also reports beng constipated. She has miralax and dulcolax and asked about taking both medications. I advised her that it was safe to take both and to follow up with her primary care for her diverticulitis.   Tami Lin Jnya Brossard, PA-C 01/08/2017, 7:20 AM [place]

## 2017-01-10 ENCOUNTER — Ambulatory Visit: Payer: Medicare Other | Admitting: Family Medicine

## 2017-01-17 ENCOUNTER — Other Ambulatory Visit: Payer: Self-pay

## 2017-01-17 ENCOUNTER — Ambulatory Visit (HOSPITAL_COMMUNITY): Payer: Medicare Other | Attending: Cardiology

## 2017-01-17 DIAGNOSIS — R600 Localized edema: Secondary | ICD-10-CM | POA: Insufficient documentation

## 2017-01-17 DIAGNOSIS — R0602 Shortness of breath: Secondary | ICD-10-CM | POA: Insufficient documentation

## 2017-01-20 ENCOUNTER — Telehealth: Payer: Self-pay | Admitting: *Deleted

## 2017-01-20 NOTE — Telephone Encounter (Signed)
-----   Message from [place], [place] sent at 01/17/2017  1:08 PM EDT ----- Normal pumping function of heart, minimal valvular issue, no obvious structural heart issue to cause dyspnea

## 2017-01-20 NOTE — Telephone Encounter (Signed)
Spoke to patient regarding results. Asked her if there was anything else I could assist her with.  Patient voices that she's had "ringing in the ears" for years, but notes it is sometimes aggravated/worsened by medications. States since going on the atenolol this ringing has been worse, enough that she's waking up at night from it.  She was recently switched to atenolol, d/t metoprolol causing significant memory problems. Pt aware I will seek review by pharmD for recommendations.

## 2017-01-20 NOTE — Telephone Encounter (Signed)
"  ringing of the ears" is not a common side effect reported for atenolol.   If patient is not able to tolerate atenolol needs new recommendation from MD/PA for SVT treatment.

## 2017-01-24 NOTE — Telephone Encounter (Signed)
Left msg for patient to call. 

## 2017-01-27 NOTE — Telephone Encounter (Signed)
Unable to reach patient when dialed.

## 2017-03-18 ENCOUNTER — Telehealth: Payer: Self-pay | Admitting: Cardiovascular Disease

## 2017-03-18 NOTE — Telephone Encounter (Signed)
As long as her leg pain does get any worse, may followup with Dr. Gwenlyn Found in 2 weeks for assessment. Already scheduled

## 2017-03-18 NOTE — Telephone Encounter (Signed)
Pt notified she will keep an eye on it and come in for scheduled appt

## 2017-03-18 NOTE — Telephone Encounter (Signed)
S/w pt she states 2 months ago she was recently seen  here and changed from metoprolol to atenolol and states that she has been intermitantly  SOB from changing this medication, she describes that it is exertional and also while at rest. Last night she was awakened about 3am with SOB  She states that her HR is low also HR 50's so she has held this "for a few days" and SOB and HR went up to 68-86 she also describes that she is having leg pain that started 2 weeks ago she states that this pain starts in the foot then up past the ankle and by the end of the day her legs are 'just throbbing'  Please advise

## 2017-03-18 NOTE — Telephone Encounter (Signed)
New message      Pt c/o medication issue:  1. Name of Medication:  atenolol  2. How are you currently taking this medication (dosage and times per day)?  49m 3. Are you having a reaction (difficulty breathing--STAT)?  no 4. What is your medication issue?  Pt thinks this medications is making her sob.  Please advise

## 2017-03-18 NOTE — Telephone Encounter (Signed)
Pt now states that SOB is intermittent and does not happen consistently. Also the leg pain is just the left leg constantly all day and night. 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

## 2017-03-18 NOTE — Telephone Encounter (Signed)
I am not entirely confident the symptom she described is side effect from the medication. Normally we can decrease atenolol by 1/2. Also HR in 50s usually does not cause any issue, dizziness/blurry vision/and feeling of passing out usually occurs once HR < 50 bpm. Atenolol was used to suppress SVT in her case. We were unable to use diltiazem or verapmil due to unknown cause for elevated liver enzyme and those medication metabolize primarily in the liver. The SOB that woke her up is concerning, if it happens again, I would recommend an earlier visit. As for her leg pain, doe it mainly occurs at rest or with walking, if it is worse with walking, she may need an ABI

## 2017-03-19 ENCOUNTER — Telehealth: Payer: Self-pay | Admitting: Internal Medicine

## 2017-03-19 NOTE — Telephone Encounter (Signed)
Pt called back to confirm contact # [phone]

## 2017-03-19 NOTE — Telephone Encounter (Signed)
Called and spoke with pt. Pt reports of increased sob, prod cough with yellow mucus and chest tightness x1 week Pt is requesting apt with CY. Offered appointment with TP in out [place] office, pt declined due to living in [place].  Pt has been scheduled for acute visit with CY on 03/21/17-okay per Katie Pt advised to go to ED or urgent if symptoms worsen in the meantime. Pt voiced her understanding. Nothing further needed.

## 2017-03-21 ENCOUNTER — Ambulatory Visit (INDEPENDENT_AMBULATORY_CARE_PROVIDER_SITE_OTHER)
Admission: RE | Admit: 2017-03-21 | Discharge: 2017-03-21 | Disposition: A | Discharge status: Home / Self Care | Payer: Medicare Other | Source: Ambulatory Visit | Attending: Internal Medicine | Admitting: Internal Medicine

## 2017-03-21 ENCOUNTER — Other Ambulatory Visit (INDEPENDENT_AMBULATORY_CARE_PROVIDER_SITE_OTHER): Payer: Medicare Other

## 2017-03-21 ENCOUNTER — Encounter: Payer: Self-pay | Admitting: Internal Medicine

## 2017-03-21 ENCOUNTER — Telehealth: Payer: Self-pay | Admitting: Internal Medicine

## 2017-03-21 ENCOUNTER — Ambulatory Visit (INDEPENDENT_AMBULATORY_CARE_PROVIDER_SITE_OTHER): Payer: Medicare Other | Admitting: Internal Medicine

## 2017-03-21 VITALS — BP 124/72 | HR 75 | Ht 65.5 in | Wt 225.6 lb

## 2017-03-21 DIAGNOSIS — G4733 Obstructive sleep apnea (adult) (pediatric): Secondary | ICD-10-CM

## 2017-03-21 DIAGNOSIS — R0609 Other forms of dyspnea: Secondary | ICD-10-CM

## 2017-03-21 DIAGNOSIS — R0602 Shortness of breath: Secondary | ICD-10-CM | POA: Diagnosis not present

## 2017-03-21 DIAGNOSIS — J45901 Unspecified asthma with (acute) exacerbation: Secondary | ICD-10-CM | POA: Insufficient documentation

## 2017-03-21 DIAGNOSIS — J4521 Mild intermittent asthma with (acute) exacerbation: Secondary | ICD-10-CM | POA: Diagnosis not present

## 2017-03-21 DIAGNOSIS — R05 Cough: Secondary | ICD-10-CM | POA: Diagnosis not present

## 2017-03-21 LAB — BRAIN NATRIURETIC PEPTIDE: Pro B Natriuretic peptide (BNP): 25 pg/mL (ref 0.0–100.0)

## 2017-03-21 MED ORDER — LEVALBUTEROL HCL 0.63 MG/3ML IN NEBU
0.6300 mg | INHALATION_SOLUTION | Freq: Once | RESPIRATORY_TRACT | Status: AC
  Administered 2017-03-21: 0.63 mg via RESPIRATORY_TRACT

## 2017-03-21 MED ORDER — METHYLPREDNISOLONE ACETATE 80 MG/ML IJ SUSP
80.0000 mg | Freq: Once | INTRAMUSCULAR | Status: AC
  Administered 2017-03-21: 80 mg via INTRAMUSCULAR

## 2017-03-21 MED ORDER — ALBUTEROL SULFATE HFA 108 (90 BASE) MCG/ACT IN AERS: 2.0000 | INHALATION_SPRAY | Freq: Four times a day (QID) | RESPIRATORY_TRACT | 12 refills | Status: DC | PRN

## 2017-03-21 MED ORDER — LEVALBUTEROL TARTRATE 45 MCG/ACT IN AERO: INHALATION_SPRAY | RESPIRATORY_TRACT | 12 refills | Status: DC

## 2017-03-21 NOTE — Assessment & Plan Note (Addendum)
She describes increased cough with intermittent chest tightness and shortness of breath. This might be a bronchitis. Began about 2 weeks after she started atenolol so they might be related, possibly with some bronchoconstriction. from Beta blocker. Plan-CXR, Xopenex nebulizer treatment here, Depo-Medrol with discussion about diabetes, sample of Bevespi inhaler. Office spirometry. Check for CHF and PE by ordering BNP, D-dimer.

## 2017-03-21 NOTE — Patient Instructions (Addendum)
Order- office spirometry      Dx exacerbation asthmatic bronchitis  Order- neb xop 0.63                Depo 80  Order CXR  Order- lab- BNP, D-dimer     Dx dyspnea on exertion  Sample Bevespi inhaler       Try inhaling 2 puffs, twice daily  Script sent for levalbuterol rescue inhaler to use if needed  We will check on the download from [facility] for your CPAP

## 2017-03-21 NOTE — Telephone Encounter (Signed)
Received a faxed form from [facility] that is requesting pt change from Xopenex to Pro-air as this is preferred by insurance. Per CY ok to change to Pro-air. This has been sent to preferred pharmacy. Called and made pt aware. Nothing further needed at this time.

## 2017-03-21 NOTE — Assessment & Plan Note (Signed)
She feels she continues to benefit from CPAP. We will request download.

## 2017-03-21 NOTE — Progress Notes (Signed)
HPI F never smoker followed for OSA, hx RLL nodule/ mucus plug, allergic rhinitis, complicated by insomnia, GERD, IBS, obesity, SVT, DM 2 NPSG 07/31/03- AHI 42/ hr, CPAP then titrated to 9. Weight was 200 lbs.  ----------------------------------------------------------------------------------  04/09/15- 67 yoF never smoker followed for OSA, hx RLL nodule/ mucus plug FOLLOWS FOR:  sleep apnea--has not been using her machine for many months.  she is not sure how long. she stated that the new machine feels like she is having too much pressure and a very dry mouth CPAP auto 5-15/ [facility] CXR 08/31/13- non lung nodule noted Download shows suboptimal utilization had about 60% of days greater than 4 hours use. A fixed pressure of 12 would've work if we changed from [employer]. When husband died she got less regular with CPAP use. Describes dry cough for several months. Took Augmentin in June which gave temporary help. Aware of GERD. Denies wheeze or shortness of breath.  03/21/17- 58 yoF widow never smoker followed for OSA, hx RLL nodule/ mucus plug CPAP auto 5-15/ [facility] acute visit for SOB, changed a heart medication 1 month ago and SOB with exertion started 2 weeks ago, on and off all day, very short winded Echocardiogram 01/17/17-WNL, EF 55-60 percent More short of breath off and on over the last 2 weeks. Leftover albuterol by nebulizer helps some. For several years has had hacking cough with clear or yellow sputum, chronic nasal drainage and stuffiness. Denies chest pain, leg pain or syncope. Verapamil was changed to atenolol 2 weeks before the dyspnea became more apparent. She continues to use CPAP without problems.  ROS-see HPI + = positive Constitutional:   No-   weight loss, night sweats, fevers, chills, fatigue, lassitude. HEENT:   +headaches, no-difficulty swallowing, tooth/dental problems, sore throat,       No-  sneezing, itching, ear ache, nasal congestion, +post nasal drip,  CV:  No-    chest pain, orthopnea, PND, swelling in lower extremities, anasarca, dizziness, palpitations Resp: + shortness of breath with exertion or at rest.             + productive cough, + non-productive cough,  No- coughing up of blood.              No- change in color of mucus.  No- wheezing.   Skin: No- rash or lesions. GI:   +heartburn, indigestion, no-abdominal pain, nausea, vomiting,  GU:  MS:  +  joint pain or swelling.   Neuro-     nothing unusual Psych:  No- change in mood or affect. No depression or anxiety.  No memory loss.  OBJ- Physical Exam General- Alert, Oriented, Affect-appropriate, Distress- none acute, overweight Skin- rash-none, lesions- none, excoriation- none Lymphadenopathy- none Head- atraumatic            Eyes- Gross vision intact, PERRLA, conjunctivae and secretions clear            Ears- Hearing, canals-normal            Nose- + turbinate edema, no-Septal dev, mucus, polyps, erosion, perforation             Throat- Mallampati III-IV , mucosa clear-not dry , drainage- none, tonsils- atrophic, own teeth Neck- flexible , trachea midline, no stridor , thyroid nl, carotid no bruit Chest - symmetrical excursion , unlabored           Heart/CV- RRR , no murmur , no gallop  , no rub, nl s1 s2                           -  JVD- none , edema- none, stasis changes- none, varices- none           Lung- clear to P&A, wheeze- none, cough- none , dullness-none, rub- none           Chest wall-  Abd-  Br/ Gen/ Rectal- Not done, not indicated Extrem- cyanosis- none, clubbing, none, atrophy- none, strength- nl Neuro- grossly intact to observation

## 2017-03-22 LAB — D-DIMER, QUANTITATIVE (NOT AT ARMC): D DIMER QUANT: 0.88 ug{FEU}/mL — AB (ref <0.50)

## 2017-03-25 ENCOUNTER — Other Ambulatory Visit: Payer: Self-pay | Admitting: *Deleted

## 2017-03-25 DIAGNOSIS — R0989 Other specified symptoms and signs involving the circulatory and respiratory systems: Secondary | ICD-10-CM

## 2017-03-25 DIAGNOSIS — R7989 Other specified abnormal findings of blood chemistry: Secondary | ICD-10-CM

## 2017-03-26 ENCOUNTER — Encounter (HOSPITAL_COMMUNITY): Payer: Self-pay

## 2017-03-26 ENCOUNTER — Ambulatory Visit (HOSPITAL_COMMUNITY)
Admission: RE | Admit: 2017-03-26 | Discharge: 2017-03-26 | Disposition: A | Discharge status: Home / Self Care | Payer: Medicare Other | Source: Ambulatory Visit | Attending: Internal Medicine | Admitting: Internal Medicine

## 2017-03-26 ENCOUNTER — Other Ambulatory Visit: Payer: Medicare Other

## 2017-03-26 ENCOUNTER — Other Ambulatory Visit: Payer: Self-pay | Admitting: Pulmonary Disease

## 2017-03-26 DIAGNOSIS — R7989 Other specified abnormal findings of blood chemistry: Secondary | ICD-10-CM

## 2017-03-26 DIAGNOSIS — R0989 Other specified symptoms and signs involving the circulatory and respiratory systems: Secondary | ICD-10-CM

## 2017-03-26 MED ORDER — ALBUTEROL SULFATE HFA 108 (90 BASE) MCG/ACT IN AERS: 2.0000 | INHALATION_SPRAY | Freq: Four times a day (QID) | RESPIRATORY_TRACT | 12 refills | Status: DC | PRN

## 2017-03-26 NOTE — Progress Notes (Signed)
Today's bilateral lower extremity venous duplex is negative for DVT. Preliminary results given to Dr. Annamaria Boots.

## 2017-03-27 LAB — D-DIMER, QUANTITATIVE (NOT AT ARMC): D DIMER QUANT: 0.76 ug{FEU}/mL — AB (ref <0.50)

## 2017-03-28 ENCOUNTER — Telehealth: Payer: Self-pay | Admitting: Internal Medicine

## 2017-03-28 NOTE — Telephone Encounter (Signed)
Called and spoke with pt and she is aware of d dimer results per CY.  She stated that the only thing different that she is doing is the meds that was given by cardiology--she is only taking this every other day or so.  She wanted to make CY aware.  Will forward this as a FYI.  Nothing further is needed.

## 2017-04-04 ENCOUNTER — Encounter: Payer: Self-pay | Admitting: Cardiovascular Disease

## 2017-04-04 ENCOUNTER — Ambulatory Visit (INDEPENDENT_AMBULATORY_CARE_PROVIDER_SITE_OTHER): Payer: Medicare Other | Admitting: Cardiovascular Disease

## 2017-04-04 DIAGNOSIS — R609 Edema, unspecified: Secondary | ICD-10-CM

## 2017-04-04 DIAGNOSIS — E7800 Pure hypercholesterolemia, unspecified: Secondary | ICD-10-CM

## 2017-04-04 DIAGNOSIS — R002 Palpitations: Secondary | ICD-10-CM | POA: Diagnosis not present

## 2017-04-04 NOTE — Assessment & Plan Note (Signed)
History of hyperlipidemia not on statin therapy followed by her PCP 

## 2017-04-04 NOTE — Progress Notes (Signed)
04/04/2017 [name] [name]   11-16-1945  [id number]  Primary Physician Jinny Sanders, MD Primary Cardiologist: Lorretta Harp MD [name], [place]  HPI:  [name] [name] is a 71 y.o. female widowed Caucasian female mother of 5 children, grandmother of 6 grandchildren who is retired from doing office work. She was seen by Almyra Deforest [facility] and myself on 01/04/17. Prior to that, she had seen Dr. Acie Fredrickson and Dr. Johnsie Cancel . She has a history of obesity, chronic diastolic dysfunction on diuretics with lower extremity edema, palpitations with event monitoring June 2013 showing PACs and PVCs. 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 She does complain of episodic shortness of breath for which she takes bronchodilators on a rescue basis and sees Dr. Annamaria Boots for this. We saw her 3 months ago we had stopped her verapamil and transitioned her to atenolol. Apparently metoprolol was causing her loss she has taken the atenolol every third day. She's had several episodes of shortness of breath lasting all day improved with inhaled bronchodilators. She also has GERD with reactive airways disease as a result of this.   Current Meds  Medication Sig  . albuterol (PROAIR HFA) 108 (90 Base) MCG/ACT inhaler Inhale 2 puffs into the lungs every 6 (six) hours as needed for wheezing or shortness of breath.  Marland Kitchen aspirin 81 MG tablet Take 81 mg by mouth daily.  . Calcium Carbonate-Vitamin D (CALCIUM + D PO) Take 1,200 mg by mouth 2 (two) times daily.   . cetirizine (ZYRTEC) 10 MG tablet Take 10 mg by mouth daily.  . cholecalciferol (VITAMIN D) 1000 units tablet Take 2,000 Units by mouth 2 (two) times daily.  . famotidine (PEPCID) 20 MG tablet Take 20 mg by mouth daily.  . fluticasone (FLONASE) 50 MCG/ACT nasal spray 1-2 puffs each nostril, once or twice daily for allergy  . furosemide (LASIX) 40 MG tablet Take 40 mg by mouth daily.  . Insulin Pen Needle (B-D ULTRAFINE III SHORT  PEN) 31G X 8 MM MISC Use to inject insulin two times a day.  Dx: E11.9  . Magnesium Cl-Calcium Carbonate (SLOW-MAG PO) Take 2 tablets by mouth daily.  Marland Kitchen NEXIUM 40 MG capsule TAKE 1 CAPSULE BY MOUTH DAILY  . NOVOLIN 70/30 RELION (70-30) 100 UNIT/ML injection 70 units in the morning, 25 units at lunch and 55 units at dinner     Allergies  Allergen Reactions  . Doxycycline Hives  . Epinephrine Hcl     REACTION: Had tremors when it was given.  . Erythromycin Ethylsuccinate     REACTION: abd spasms  . Iohexol      Code: HIVES, Desc: pt needs premedicated-- hives on prev contrast study per md office   . Oxycodone     "felt like I was dying"    Social History   Social History  . Marital status: Married    Spouse name: N/A  . Number of children: 5  . Years of education: N/A   Occupational History  .  Retired   Social History Main Topics  . Smoking status: Never Smoker  . Smokeless tobacco: Never Used  . Alcohol use No  . Drug use: No  . Sexual activity: Not on file   Other Topics Concern  . Not on file   Social History Narrative   Husband treated for testicular cancer, 2 cups coffee a day, no exercise     Review of Systems: General: negative  for chills, fever, night sweats or weight changes.  Cardiovascular: negative for chest pain, dyspnea on exertion, edema, orthopnea, palpitations, paroxysmal nocturnal dyspnea or shortness of breath Dermatological: negative for rash Respiratory: negative for cough or wheezing Urologic: negative for hematuria Abdominal: negative for nausea, vomiting, diarrhea, bright red blood per rectum, melena, or hematemesis Neurologic: negative for visual changes, syncope, or dizziness All other systems reviewed and are otherwise negative except as noted above.    Blood pressure (!) 148/77, pulse 83, height 5' 4.5" (1.638 m), weight 224 lb (101.6 kg).  General appearance: alert and no distress Neck: no adenopathy, no carotid bruit, no JVD,  supple, symmetrical, trachea midline and thyroid not enlarged, symmetric, no tenderness/mass/nodules Lungs: clear to auscultation bilaterally Heart: regular rate and rhythm, S1, S2 normal, no murmur, click, rub or gallop Extremities: extremities normal, atraumatic, no cyanosis or edema Pulses: 2+ and symmetric Skin: Skin color, texture, turgor normal. No rashes or lesions Neurologic: Alert and oriented X 3, normal strength and tone. Normal symmetric reflexes. Normal coordination and gait  EKG not performed today  ASSESSMENT AND PLAN:   HYPERCHOLESTEROLEMIA History of hyperlipidemia not on statin therapy followed by her PCP  Palpitations History of palpitations and SVT with monitoring showing PACs and PVCs intolerant to metoprolol because of memory loss and verapamil. She currently is on atenolol which she takes every third day. She says the medications improve her daytime palpitations but she still has some nocturnal palpitations. I've instructed her to take atenolol on a daily basis.  Peripheral edema History of peripheral edema on by mouth diuretics which improved this. She does not appear to have diastolic dysfunction on recent 2-D echo performed 01/17/17.      Lorretta Harp MD FACP,FACC,FAHA, [facility] 04/04/2017 11:41 AM

## 2017-04-04 NOTE — Assessment & Plan Note (Signed)
History of peripheral edema on by mouth diuretics which improved this. She does not appear to have diastolic dysfunction on recent 2-D echo performed 01/17/17.

## 2017-04-04 NOTE — Patient Instructions (Signed)
Medication Instructions:  Your physician recommends that you continue on your current medications as directed. Please refer to the Current Medication list given to you today.  Follow-Up: Your physician wants you to follow-up in: 6 MONTHS with Almyra Deforest PA and 12 MONTHS with Dr. Gwenlyn Found. You will receive a reminder letter in the mail two months in advance. If you don't receive a letter, please call our office to schedule the follow-up appointment.   Any Other Special Instructions Will Be Listed Below (If Applicable).     If you need a refill on your cardiac medications before your next appointment, please call your pharmacy.

## 2017-04-04 NOTE — Assessment & Plan Note (Signed)
History of palpitations and SVT with monitoring showing PACs and PVCs intolerant to metoprolol because of memory loss and verapamil. She currently is on atenolol which she takes every third day. She says the medications improve her daytime palpitations but she still has some nocturnal palpitations. I've instructed her to take atenolol on a daily basis.

## 2017-04-11 ENCOUNTER — Telehealth: Payer: Self-pay | Admitting: Internal Medicine

## 2017-04-11 MED ORDER — AMOXICILLIN 500 MG PO TABS: 500.0000 mg | ORAL_TABLET | Freq: Two times a day (BID) | ORAL | 0 refills | Status: DC

## 2017-04-11 NOTE — Telephone Encounter (Signed)
Spoke with pt. States that she is not feeling well. Reports coughing, chest tightness, wheezing and SOB. Cough is producing clear mucus. Denies fever/chills/sweats. Symptoms started 1 week ago. Would like to have something sent in.  CY - please advise. Thanks.  Allergies  Allergen Reactions  . Doxycycline Hives  . Epinephrine Hcl     REACTION: Had tremors when it was given.  . Erythromycin Ethylsuccinate     REACTION: abd spasms  . Iohexol      Code: HIVES, Desc: pt needs premedicated-- hives on prev contrast study per md office   . Oxycodone     "felt like I was dying"   Current Outpatient Prescriptions on File Prior to Visit  Medication Sig Dispense Refill  . albuterol (PROAIR HFA) 108 (90 Base) MCG/ACT inhaler Inhale 2 puffs into the lungs every 6 (six) hours as needed for wheezing or shortness of breath. 1 Inhaler 12  . aspirin 81 MG tablet Take 81 mg by mouth daily.    Marland Kitchen atenolol (TENORMIN) 25 MG tablet Take 1 tablet (25 mg total) by mouth daily. (Patient taking differently: Take 25 mg by mouth daily. Takes every third  Day) 30 tablet 11  . Calcium Carbonate-Vitamin D (CALCIUM + D PO) Take 1,200 mg by mouth 2 (two) times daily.     . cetirizine (ZYRTEC) 10 MG tablet Take 10 mg by mouth daily.    . cholecalciferol (VITAMIN D) 1000 units tablet Take 2,000 Units by mouth 2 (two) times daily.    . famotidine (PEPCID) 20 MG tablet Take 20 mg by mouth daily.    . fluticasone (FLONASE) 50 MCG/ACT nasal spray 1-2 puffs each nostril, once or twice daily for allergy 16 g prn  . furosemide (LASIX) 40 MG tablet Take 40 mg by mouth daily.    . Insulin Pen Needle (B-D ULTRAFINE III SHORT PEN) 31G X 8 MM MISC Use to inject insulin two times a day.  Dx: E11.9 100 each 0  . Magnesium Cl-Calcium Carbonate (SLOW-MAG PO) Take 2 tablets by mouth daily.    Marland Kitchen NEXIUM 40 MG capsule TAKE 1 CAPSULE BY MOUTH DAILY 90 capsule 1  . NOVOLIN 70/30 RELION (70-30) 100 UNIT/ML injection 70 units in the  morning, 25 units at lunch and 55 units at dinner     No current facility-administered medications on file prior to visit.

## 2017-04-11 NOTE — Telephone Encounter (Signed)
Pt is aware of CY's recommendations and voiced her understanding. Rx has been sent to preferred pharmacy. Nothing further needed.

## 2017-04-11 NOTE — Telephone Encounter (Signed)
Offer amoxacillin 500 mg, # 14, 1 twice daily             Suggest Mucinex-DM or Delsym otc for cough

## 2017-05-01 ENCOUNTER — Encounter: Payer: Self-pay | Admitting: Internal Medicine

## 2017-05-02 ENCOUNTER — Encounter: Payer: Self-pay | Admitting: Internal Medicine

## 2017-05-02 ENCOUNTER — Ambulatory Visit (INDEPENDENT_AMBULATORY_CARE_PROVIDER_SITE_OTHER): Payer: Medicare Other | Admitting: Internal Medicine

## 2017-05-02 VITALS — BP 122/68 | HR 67 | Ht 65.5 in | Wt 223.0 lb

## 2017-05-02 DIAGNOSIS — J4531 Mild persistent asthma with (acute) exacerbation: Secondary | ICD-10-CM

## 2017-05-02 DIAGNOSIS — G4733 Obstructive sleep apnea (adult) (pediatric): Secondary | ICD-10-CM | POA: Diagnosis not present

## 2017-05-02 DIAGNOSIS — K219 Gastro-esophageal reflux disease without esophagitis: Secondary | ICD-10-CM | POA: Diagnosis not present

## 2017-05-02 MED ORDER — BUDESONIDE-FORMOTEROL FUMARATE 80-4.5 MCG/ACT IN AERO: 2.0000 | INHALATION_SPRAY | Freq: Two times a day (BID) | RESPIRATORY_TRACT | 0 refills | Status: DC

## 2017-05-02 MED ORDER — ALBUTEROL SULFATE HFA 108 (90 BASE) MCG/ACT IN AERS
2.0000 | INHALATION_SPRAY | Freq: Four times a day (QID) | RESPIRATORY_TRACT | 12 refills | Status: DC | PRN
Start: 2017-05-02 — End: 2017-12-14

## 2017-05-02 NOTE — Assessment & Plan Note (Signed)
She seems to be describing possible laryngospasm and admits reflux. She thinks her rescue inhaler helps this some require to try going back to Symbicort for maintenance therapy with emphasis on mouth care. The LAMA in [place] might have caused some blurring vision.

## 2017-05-02 NOTE — Assessment & Plan Note (Signed)
Emphasis on reflux precautions. I discussed elevating head of bed while she continues acid blocker therapy.

## 2017-05-02 NOTE — Assessment & Plan Note (Signed)
Compliance and control her good using AutoPap 4-10, confirmed by download. She sleeps better and clearly benefits. Needs new supplies.

## 2017-05-02 NOTE — Patient Instructions (Addendum)
Sample Symbicort 80     Inhale 2 puffs, then rinse mouth, twice daily See if this helps your breathing  Ok to continue using the red Proair rescue inhaler, up to every 6 hours if needed  Order- [facility]- Please replace supplies. She is having trouble with skin irritation from current mask- please change as able.   Refill script sent  Please call if we can help

## 2017-05-02 NOTE — Progress Notes (Signed)
HPI F never smoker followed for OSA, hx RLL nodule/ mucus plug, allergic rhinitis, complicated by insomnia, GERD, IBS, obesity, SVT, DM 2 NPSG 07/31/03- AHI 42/ hr, CPAP then titrated to 9. Weight was 200 lbs. Echocardiogram 01/17/17-WNL, EF 55-60 % Office Spirometry 03/21/17- WNL ----------------------------------------------------------------------------------  03/21/17- 67 yoF widow never smoker followed for OSA, hx RLL nodule/ mucus plug CPAP auto 5-15/ [facility] acute visit for SOB, changed a heart medication 1 month ago and SOB with exertion started 2 weeks ago, on and off all day, very short winded Echocardiogram 01/17/17-WNL, EF 55-60 percent More short of breath off and on over the last 2 weeks. Leftover albuterol by nebulizer helps some. For several years has had hacking cough with clear or yellow sputum, chronic nasal drainage and stuffiness. Denies chest pain, leg pain or syncope. Verapamil was changed to atenolol 2 weeks before the dyspnea became more apparent. She continues to use CPAP without problems.  05/02/17- 67 yoF widow never smoker followed for OSA, Insomnia,  hx RLL nodule/ mucus plug, complicated by DM 2, GERD, IBS, SVT CPAP auto 4-10/ [facility] OSA; DME [facility]. Pt wears CPAP nightly and DL attached. Will need order for new supplies.  Download 87% compliance averaging 6 hours 30 minutes, AHI 0.5/hour No acute respiratory issues. Over several months is had a feeling of spasm or tightness in the upper airway or lower throat suggestive of laryngospasm. Frequent reflux. Frequent palpitations evaluated by cardiology. Pro air rescue helps, used about twice daily. She thought Bevespi blurred vision- no glaucoma. Office Spirometry 03/21/17- WNL CXR 03/21/17 IMPRESSION: Mild left base subsegmental atelectasis and/or scarring again noted. No interim change.  ROS-see HPI + = positive Constitutional:   No-   weight loss, night sweats, fevers, chills, fatigue, lassitude. HEENT:    +headaches, no-difficulty swallowing, tooth/dental problems, sore throat,       No-  sneezing, itching, ear ache, nasal congestion, +post nasal drip,  CV:  No-   chest pain, orthopnea, PND, swelling in lower extremities, anasarca, dizziness, palpitations Resp: + shortness of breath with exertion or at rest.              productive cough,  non-productive cough,  No- coughing up of blood.              No- change in color of mucus.  No- wheezing.   Skin: No- rash or lesions. GI:   +heartburn, indigestion, no-abdominal pain, nausea, vomiting,  GU:  MS:  +  joint pain or swelling.   Neuro-     nothing unusual Psych:  No- change in mood or affect. No depression or anxiety.  No memory loss.  OBJ- Physical Exam General- Alert, Oriented, Affect-appropriate, Distress- none acute, + overweight Skin- rash-none, lesions- none, excoriation- none Lymphadenopathy- none Head- atraumatic            Eyes- Gross vision intact, PERRLA, conjunctivae and secretions clear            Ears- Hearing, canals-normal            Nose- + turbinate edema, no-Septal dev, mucus, polyps, erosion, perforation             Throat- Mallampati III-IV , mucosa clear-not dry , drainage- none, tonsils- atrophic, own teeth Neck- flexible , trachea midline, no stridor , thyroid nl, carotid no bruit Chest - symmetrical excursion , unlabored           Heart/CV- RRR , no murmur , no gallop  , no rub, nl  s1 s2                           - JVD- none , edema- none, stasis changes- none, varices- none           Lung- clear to P&A, wheeze- none, cough- none , dullness-none, rub- none           Chest wall-  Abd-  Br/ Gen/ Rectal- Not done, not indicated Extrem- cyanosis- none, clubbing, none, atrophy- none, strength- nl Neuro- grossly intact to observation

## 2017-09-29 ENCOUNTER — Encounter: Payer: Self-pay | Admitting: Endocrinology

## 2017-11-04 ENCOUNTER — Encounter: Payer: Self-pay | Admitting: Internal Medicine

## 2017-11-04 ENCOUNTER — Ambulatory Visit: Payer: Medicare Other | Admitting: Internal Medicine

## 2017-11-05 ENCOUNTER — Ambulatory Visit (INDEPENDENT_AMBULATORY_CARE_PROVIDER_SITE_OTHER): Payer: Medicare Other | Admitting: Internal Medicine

## 2017-11-05 ENCOUNTER — Encounter: Payer: Self-pay | Admitting: Internal Medicine

## 2017-11-05 VITALS — BP 118/72 | HR 57 | Ht 65.5 in | Wt 224.0 lb

## 2017-11-05 DIAGNOSIS — G4733 Obstructive sleep apnea (adult) (pediatric): Secondary | ICD-10-CM

## 2017-11-05 NOTE — Progress Notes (Signed)
HPI F never smoker followed for OSA, hx RLL nodule/ mucus plug, allergic rhinitis, complicated by insomnia, GERD, IBS, obesity, SVT, DM 2 NPSG 07/31/03- AHI 42/ hr, CPAP then titrated to 9. Weight was 200 lbs. Echocardiogram 01/17/17-WNL, EF 55-60 % Office Spirometry 03/21/17- WNL ----------------------------------------------------------------------------------  05/02/17- 78 yoF widow never smoker followed for OSA, Insomnia,  hx RLL nodule/ mucus plug, complicated by DM 2, GERD, IBS, SVT CPAP auto 4-10/ [facility]SA; DME [facility]. Pt wears CPAP nightly and DL attached. Will need order for new supplies.  Download 87% compliance averaging 6 hours 30 minutes, AHI 0.5/hour No acute respiratory issues. Over several months is had a feeling of spasm or tightness in the upper airway or lower throat suggestive of laryngospasm. Frequent reflux. Frequent palpitations evaluated by cardiology. Pro air rescue helps, used about twice daily. She thought Bevespi blurred vision- no glaucoma. Office Spirometry 03/21/17- WNL CXR 03/21/17 IMPRESSION: Mild left base subsegmental atelectasis and/or scarring again noted. No interim change.  11/05/17- 60 yoF widow never smoker followed for OSA, Insomnia,  hx RLL nodule/ mucus plug, complicated by DM 2, GERD, IBS, SVT CPAP auto 4-10/ [facility] ----OSA; DME: [facility] Pt wears CPAP nightly but is having trouble with face mask. DL attached. Pt will need order for new supplies.  Had trouble wearing CPAP while she had the flu.  Mask is not fitting well. Download 93% compliance AHI 0.7/hour.  She still sleeps better with CPAP. Otherwise breathing has been comfortable with no recent need for albuterol or Symbicort, no routine cough.  ROS-see HPI + = positive Constitutional:   No-   weight loss, night sweats, fevers, chills, fatigue, lassitude. HEENT:   +headaches, no-difficulty swallowing, tooth/dental problems, sore throat,       No-  sneezing, itching, ear ache, nasal congestion, +post  nasal drip,  CV:  No-   chest pain, orthopnea, PND, swelling in lower extremities, anasarca, dizziness, palpitations Resp: + shortness of breath with exertion or at rest.              productive cough,  non-productive cough,  No- coughing up of blood.              No- change in color of mucus.  No- wheezing.   Skin: No- rash or lesions. GI:   +heartburn, indigestion, no-abdominal pain, nausea, vomiting,  GU:  MS:  +  joint pain or swelling.   Neuro-     nothing unusual Psych:  No- change in mood or affect. No depression or anxiety.  No memory loss.  OBJ- Physical Exam General- Alert, Oriented, Affect-appropriate, Distress- none acute, + overweight Skin- rash-none, lesions- none, excoriation- none Lymphadenopathy- none Head- atraumatic            Eyes- Gross vision intact, PERRLA, conjunctivae and secretions clear            Ears- Hearing, canals-normal            Nose-  turbinate edema, no-Septal dev, mucus, polyps, erosion, perforation             Throat- Mallampati III-IV , mucosa clear-not dry , drainage- none, tonsils- atrophic, own teeth Neck- flexible , trachea midline, no stridor , thyroid nl, carotid no bruit Chest - symmetrical excursion , unlabored           Heart/CV- RRR , no murmur , no gallop  , no rub, nl s1 s2                           -  JVD- none , edema- none, stasis changes- none, varices- none           Lung- clear to P&A, wheeze- none, cough- none , dullness-none, rub- none           Chest wall-  Abd-  Br/ Gen/ Rectal- Not done, not indicated Extrem- cyanosis- none, clubbing, none, atrophy- none, strength- nl Neuro- grossly intact to observation

## 2017-11-05 NOTE — Patient Instructions (Signed)
Order- DME [facility]  Please replace supplies, continue auto 4-10, mask of choice, humidifier, AirView  Order- Please schedule Mask fitting at [facility]    Dx OSA  Please cal if we can help

## 2017-11-11 NOTE — Assessment & Plan Note (Addendum)
Continue CPAP auto 4-10 and sleeps better wearing it.  Download confirms excellent compliance and control. Plan-continue CPAP auto 4-10.  Refill supplies.  Fit mask.

## 2017-11-11 NOTE — Assessment & Plan Note (Signed)
She denies wheeze, cough or significant shortness of breath with routine activities and has not felt the need to use either maintenance or rescue inhalers in months. Plan-keep medications available.

## 2017-11-14 ENCOUNTER — Ambulatory Visit: Payer: Medicare Other

## 2017-11-14 ENCOUNTER — Telehealth: Payer: Self-pay | Admitting: Family Medicine

## 2017-11-14 DIAGNOSIS — E1165 Type 2 diabetes mellitus with hyperglycemia: Secondary | ICD-10-CM

## 2017-11-14 DIAGNOSIS — E7800 Pure hypercholesterolemia, unspecified: Secondary | ICD-10-CM

## 2017-11-14 DIAGNOSIS — E559 Vitamin D deficiency, unspecified: Secondary | ICD-10-CM

## 2017-11-14 NOTE — Telephone Encounter (Signed)
-----   Message from Eustace Pen, LPN sent at 2/33/6122 12:12 PM EDT ----- Regarding: Labs 4/26 Lab orders needed. Thank you.  Insurance:  [employer]

## 2017-11-19 ENCOUNTER — Other Ambulatory Visit: Payer: Medicare Other

## 2017-11-21 ENCOUNTER — Ambulatory Visit: Payer: Medicare Other | Admitting: Family Medicine

## 2017-11-21 DIAGNOSIS — Z0289 Encounter for other administrative examinations: Secondary | ICD-10-CM

## 2017-11-25 DIAGNOSIS — S93492A Sprain of other ligament of left ankle, initial encounter: Secondary | ICD-10-CM | POA: Diagnosis not present

## 2017-12-02 ENCOUNTER — Other Ambulatory Visit (HOSPITAL_BASED_OUTPATIENT_CLINIC_OR_DEPARTMENT_OTHER): Payer: Medicare Other

## 2017-12-03 DIAGNOSIS — S92515A Nondisplaced fracture of proximal phalanx of left lesser toe(s), initial encounter for closed fracture: Secondary | ICD-10-CM | POA: Diagnosis not present

## 2017-12-03 DIAGNOSIS — M79672 Pain in left foot: Secondary | ICD-10-CM | POA: Diagnosis not present

## 2017-12-12 ENCOUNTER — Telehealth: Payer: Self-pay | Admitting: Internal Medicine

## 2017-12-12 MED ORDER — AMOXICILLIN-POT CLAVULANATE 875-125 MG PO TABS: 1.0000 | ORAL_TABLET | Freq: Two times a day (BID) | ORAL | 0 refills | Status: DC

## 2017-12-12 NOTE — Telephone Encounter (Signed)
Called and spoke with patient, she states that she has had flu like symptoms for two weeks now. Ear aches, productive cough with green mucus, feeling of rocks stuck in throat, low grade fever and chest congestion. Patient could not give me exact fever grade. Patient denies body aches. Patient has taken both delsym and pseudafed but neither is helping.   CY please advise, thank you.    Current Outpatient Medications on File Prior to Visit  Medication Sig Dispense Refill  . albuterol (PROAIR HFA) 108 (90 Base) MCG/ACT inhaler Inhale 2 puffs into the lungs every 6 (six) hours as needed for wheezing or shortness of breath. 1 Inhaler 12  . aspirin 81 MG tablet Take 81 mg by mouth daily.    Marland Kitchen atenolol (TENORMIN) 25 MG tablet Take 1 tablet (25 mg total) by mouth daily. (Patient taking differently: Take 25 mg by mouth daily. Takes every third  Day) 30 tablet 11  . budesonide-formoterol (SYMBICORT) 80-4.5 MCG/ACT inhaler Inhale 2 puffs into the lungs 2 (two) times daily. 1 Inhaler 0  . Calcium Carbonate-Vitamin D (CALCIUM + D PO) Take 1,200 mg by mouth 2 (two) times daily.     . cetirizine (ZYRTEC) 10 MG tablet Take 10 mg by mouth daily.    . cholecalciferol (VITAMIN D) 1000 units tablet Take 2,000 Units by mouth 2 (two) times daily.    . famotidine (PEPCID) 20 MG tablet Take 20 mg by mouth daily.    . fluticasone (FLONASE) 50 MCG/ACT nasal spray 1-2 puffs each nostril, once or twice daily for allergy 16 g prn  . furosemide (LASIX) 40 MG tablet Take 40 mg by mouth daily.    . Insulin Pen Needle (B-D ULTRAFINE III SHORT PEN) 31G X 8 MM MISC Use to inject insulin two times a day.  Dx: E11.9 100 each 0  . Magnesium Cl-Calcium Carbonate (SLOW-MAG PO) Take 2 tablets by mouth daily.    Marland Kitchen NEXIUM 40 MG capsule TAKE 1 CAPSULE BY MOUTH DAILY 90 capsule 1  . NOVOLIN 70/30 RELION (70-30) 100 UNIT/ML injection 70 units in the morning, 25 units at lunch and 55 units at dinner     No current facility-administered  medications on file prior to visit.    Allergies  Allergen Reactions  . Doxycycline Hives  . Epinephrine Hcl     REACTION: Had tremors when it was given.  . Erythromycin Ethylsuccinate     REACTION: abd spasms  . Iohexol      Code: HIVES, Desc: pt needs premedicated-- hives on prev contrast study per md office   . Oxycodone     "felt like I was dying"

## 2017-12-12 NOTE — Telephone Encounter (Signed)
Offer augmentin 875, # 14, 1 twice daily 

## 2017-12-12 NOTE — Telephone Encounter (Signed)
Called and spoke with patient, she is aware of response. Medication sent. Nothing further needed.

## 2017-12-14 ENCOUNTER — Other Ambulatory Visit: Payer: Self-pay

## 2017-12-14 ENCOUNTER — Emergency Department (HOSPITAL_COMMUNITY): Payer: Medicare Other

## 2017-12-14 ENCOUNTER — Emergency Department (HOSPITAL_COMMUNITY)
Admission: EM | Admit: 2017-12-14 | Discharge: 2017-12-14 | Disposition: A | Discharge status: Home / Self Care | Payer: Medicare Other | Attending: Emergency Medicine | Admitting: Emergency Medicine

## 2017-12-14 ENCOUNTER — Encounter (HOSPITAL_COMMUNITY): Payer: Self-pay | Admitting: Emergency Medicine

## 2017-12-14 DIAGNOSIS — B9789 Other viral agents as the cause of diseases classified elsewhere: Secondary | ICD-10-CM

## 2017-12-14 DIAGNOSIS — E7800 Pure hypercholesterolemia, unspecified: Secondary | ICD-10-CM | POA: Diagnosis not present

## 2017-12-14 DIAGNOSIS — R05 Cough: Secondary | ICD-10-CM | POA: Insufficient documentation

## 2017-12-14 DIAGNOSIS — J218 Acute bronchiolitis due to other specified organisms: Secondary | ICD-10-CM | POA: Diagnosis not present

## 2017-12-14 DIAGNOSIS — Z7982 Long term (current) use of aspirin: Secondary | ICD-10-CM | POA: Diagnosis not present

## 2017-12-14 DIAGNOSIS — R059 Cough, unspecified: Secondary | ICD-10-CM

## 2017-12-14 DIAGNOSIS — E119 Type 2 diabetes mellitus without complications: Secondary | ICD-10-CM | POA: Diagnosis not present

## 2017-12-14 DIAGNOSIS — J219 Acute bronchiolitis, unspecified: Secondary | ICD-10-CM | POA: Diagnosis not present

## 2017-12-14 DIAGNOSIS — Z794 Long term (current) use of insulin: Secondary | ICD-10-CM | POA: Insufficient documentation

## 2017-12-14 DIAGNOSIS — Z79899 Other long term (current) drug therapy: Secondary | ICD-10-CM | POA: Diagnosis not present

## 2017-12-14 DIAGNOSIS — R079 Chest pain, unspecified: Secondary | ICD-10-CM | POA: Diagnosis not present

## 2017-12-14 DIAGNOSIS — R0602 Shortness of breath: Secondary | ICD-10-CM | POA: Diagnosis not present

## 2017-12-14 LAB — CBC
HEMATOCRIT: 41.5 % (ref 36.0–46.0)
HEMOGLOBIN: 13.8 g/dL (ref 12.0–15.0)
MCH: 30.2 pg (ref 26.0–34.0)
MCHC: 33.3 g/dL (ref 30.0–36.0)
MCV: 90.8 fL (ref 78.0–100.0)
PLATELETS: 218 10*3/uL (ref 150–400)
RBC: 4.57 MIL/uL (ref 3.87–5.11)
RDW: 13.1 % (ref 11.5–15.5)
WBC: 7.9 10*3/uL (ref 4.0–10.5)

## 2017-12-14 LAB — BASIC METABOLIC PANEL
ANION GAP: 11 (ref 5–15)
BUN: 11 mg/dL (ref 6–20)
CALCIUM: 9.1 mg/dL (ref 8.9–10.3)
CO2: 23 mmol/L (ref 22–32)
Chloride: 101 mmol/L (ref 101–111)
Creatinine, Ser: 0.97 mg/dL (ref 0.44–1.00)
GFR calc Af Amer: >60 mL/min (ref >60)
GFR, EST NON AFRICAN AMERICAN: 57 mL/min — AB (ref >60)
Glucose, Bld: 252 mg/dL — ABNORMAL HIGH (ref 65–99)
POTASSIUM: 4.2 mmol/L (ref 3.5–5.1)
Sodium: 135 mmol/L (ref 135–145)

## 2017-12-14 LAB — I-STAT TROPONIN, ED: Troponin i, poc: 0.01 ng/mL (ref 0.00–0.08)

## 2017-12-14 MED ORDER — PREDNISONE 20 MG PO TABS: 40.0000 mg | ORAL_TABLET | Freq: Every day | ORAL | 0 refills | Status: AC

## 2017-12-14 MED ORDER — PREDNISONE 10 MG PO TABS: 60.0000 mg | ORAL_TABLET | Freq: Every day | ORAL | 0 refills | Status: DC

## 2017-12-14 MED ORDER — PREDNISONE 10 MG PO TABS: 50.0000 mg | ORAL_TABLET | Freq: Every day | ORAL | 0 refills | Status: DC

## 2017-12-14 MED ORDER — ALBUTEROL SULFATE (2.5 MG/3ML) 0.083% IN NEBU
5.0000 mg | INHALATION_SOLUTION | Freq: Once | RESPIRATORY_TRACT | Status: AC
  Administered 2017-12-14: 5 mg via RESPIRATORY_TRACT
  Filled 2017-12-14: qty 6

## 2017-12-14 MED ORDER — ALBUTEROL SULFATE HFA 108 (90 BASE) MCG/ACT IN AERS: 2.0000 | INHALATION_SPRAY | Freq: Four times a day (QID) | RESPIRATORY_TRACT | 0 refills | Status: DC | PRN

## 2017-12-14 NOTE — Discharge Instructions (Addendum)
Please see your pulmonologist in 1 week. Take the prednisone as prescribed.

## 2017-12-14 NOTE — ED Triage Notes (Addendum)
Pt c/o chest pain and shortness of breath x 2 weeks. Started on Augmentin and neb treatments for URI on Friday, has not felt any relief.

## 2017-12-14 NOTE — ED Notes (Signed)
Patient verbalizes understanding of discharge instructions. Opportunity for questioning and answers were provided. Armband removed by staff, pt discharged from ED.  

## 2017-12-14 NOTE — ED Provider Notes (Signed)
[facility] EMERGENCY DEPARTMENT Provider Note   CSN: [id number] Arrival date & time: 12/14/17  1810     History   Chief Complaint Chief Complaint  Patient presents with  . Chest Pain    HPI [name] [name] is a 72 y.o. female.  The history is provided by the patient.  Cough  This is a new problem. The current episode started more than 1 week ago. The problem occurs constantly. The problem has not changed since onset.The cough is productive of sputum. There has been no fever. Associated symptoms include chest pain, rhinorrhea and shortness of breath. Pertinent negatives include no chills, no ear pain and no sore throat. Treatments tried: Augmentin, cough medicine, decongestant. The treatment provided no relief. She is not a smoker.    Past Medical History:  Diagnosis Date  . Abnormal abdominal CT scan 04/2006   slight abn liver  . Abnormal chest CT 10/07   unable to bx  . Abnormal CT scan, pancreas or bile duct 04/2006   cyst  . Acute cystitis   . Acute pharyngitis   . Allergic rhinitis, cause unspecified   . Arthritis    hands, feet and back  . Benign neoplasm of colon   . Cyst and pseudocyst of pancreas   . Depressive disorder, not elsewhere classified   . Diverticulitis   . Diverticulosis of colon (without mention of hemorrhage)   . Dysthymic disorder   . Dysuria   . Edema   . Esophageal reflux   . Essential and other specified forms of tremor   . Irritable bowel syndrome   . Obesity, unspecified   . Other diseases of lung, not elsewhere classified   . Other specified cardiac dysrhythmias(427.89)   . Pain in joint, lower leg   . Periapical abscess without sinus   . Polyneuropathy in diabetes(357.2)   . Pure hypercholesterolemia   . Sleep apnea    CPAP- non compliant  . Type II or unspecified type diabetes mellitus with neurological manifestations, not stated as uncontrolled(250.60)   . Type II or unspecified type diabetes mellitus  without mention of complication, not stated as uncontrolled   . Unspecified disorder of liver   . Unspecified sleep apnea   . Unspecified urinary incontinence     Patient Active Problem List   Diagnosis Date Noted  . Asthmatic bronchitis with exacerbation 03/21/2017  . Elevated LFTs 12/24/2016  . SVT (supraventricular tachycardia) ([place]) 12/24/2016  . Peripheral edema 12/24/2016  . S/P foot surgery, right 07/27/2015  . Preoperative clearance 06/29/2015  . Adverse reaction to anesthetic agent 06/29/2015  . Hand pain, right 09/06/2014  . Angioedema 08/30/2014  . Recurrent UTI 01/06/2014  . Chronic insomnia 01/06/2014  . Low back pain 03/07/2011  . Right hip pain 03/07/2011  . Vitamin D deficiency 08/20/2010  . 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 Lung nodule 08/03/2007  . CYST AND PSEUDOCYST OF PANCREAS 08/03/2007  . 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  . DISORDER, LIVER NEC 02/22/2007  . 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 HYPERCHOLESTEROLEMIA 12/30/2006  . OBESITY 12/30/2006  . TREMOR, ESSENTIAL 12/30/2006  . Palpitations 12/30/2006  . Seasonal and perennial allergic rhinitis 12/30/2006  . GERD 12/30/2006  . DIVERTICULOSIS, COLON 12/30/2006  . IBS 12/30/2006  . Obstructive sleep apnea 12/30/2006  . URINARY INCONTINENCE 12/30/2006  . Poorly controlled type 2 diabetes mellitus ([place]) 12/22/2006    Past Surgical History:  Procedure Laterality Date  . ABDOMINAL HYSTERECTOMY    . CALCANEAL OSTEOTOMY  Right 07/27/2015   Procedure: RIGHT CALCANEAL OSTEOTOMY ;  Surgeon: Wylene Simmer, MD;  Location: [facility];  Service: Orthopedics;  Laterality: Right;  . CHOLECYSTECTOMY     15-20 years ago  . GASTROCNEMIUS RECESSION Right 07/27/2015   Procedure: RIGHT GASTROC RECESSION;  Surgeon: Wylene Simmer, MD;  Location: [facility];  Service: Orthopedics;  Laterality: Right;  . KIDNEY STONE SURGERY  1980's   removal  . LITHOTRIPSY     2 [place]  1990's  . PARTIAL HYSTERECTOMY  1990's  . ROTATOR CUFF REPAIR  2005     OB History    Gravida  6   Para  4   Term      Preterm      AB  1   Living        SAB      TAB      Ectopic      Multiple      Live Births               Home Medications    Prior to Admission medications   Medication Sig Start Date End Date Taking? Authorizing Provider  amoxicillin-clavulanate (AUGMENTIN) 875-125 MG tablet Take 1 tablet by mouth 2 (two) times daily. 12/12/17  Yes Baird Lyons D, MD  aspirin 81 MG tablet Take 81 mg by mouth daily.   Yes [provider]  atenolol (TENORMIN) 25 MG tablet Take 1 tablet (25 mg total) by mouth daily. Patient taking differently: Take 12.5 mg by mouth 2 (two) times daily.  01/03/17 12/14/17 Yes Meng, Isaac Laud, PA  budesonide-formoterol (SYMBICORT) 80-4.5 MCG/ACT inhaler Inhale 2 puffs into the lungs 2 (two) times daily. 05/02/17  Yes Young, Tarri Fuller D, MD  furosemide (LASIX) 40 MG tablet Take 40 mg by mouth daily.   Yes [provider]  NEXIUM 40 MG capsule TAKE 1 CAPSULE BY MOUTH DAILY 10/28/12  Yes Bedsole, Amy E, MD  NOVOLIN 70/30 RELION (70-30) 100 UNIT/ML injection Inject 30-50 Units into the skin 3 (three) times daily. 50 units in the morning, 30 units at lunch and 50 units at dinner 12/23/12  Yes [provider]  pseudoephedrine (SUDAFED) 30 MG tablet Take 30 mg by mouth every 8 (eight) hours as needed for congestion.   Yes [provider]  albuterol (PROAIR HFA) 108 (90 Base) MCG/ACT inhaler Inhale 2 puffs into the lungs every 6 (six) hours as needed for wheezing or shortness of breath. 12/14/17   Varney Biles, MD  fluticasone (FLONASE) 50 MCG/ACT nasal spray 1-2 puffs each nostril, once or twice daily for allergy Patient not taking: Reported on 12/14/2017 01/29/13   Baird Lyons D, MD  Insulin Pen Needle (B-D ULTRAFINE III SHORT PEN) 31G X 8 MM MISC Use to inject insulin two times a day.  Dx: E11.9 12/01/14   Jinny Sanders, MD  predniSONE (DELTASONE) 10 MG tablet Take 5 tablets (50 mg total) by mouth daily. 12/14/17   Varney Biles, MD  predniSONE (DELTASONE) 20 MG tablet Take 2 tablets (40 mg total) by mouth daily for 3 days. 12/14/17 12/17/17  Clifton James, MD    Family History Family History  Problem Relation Age of Onset  . Clotting disorder Mother   . Cirrhosis Father   . Diabetes Unknown        2 aunts  . Cancer Unknown        grandmother  . Allergies Unknown        "  whole family"  . Asthma Unknown        "whole family"    Social History Social History   Tobacco Use  . Smoking status: Never Smoker  . Smokeless tobacco: Never Used  Substance Use Topics  . Alcohol use: No    Alcohol/week: 0.0 oz  . Drug use: No     Allergies   Doxycycline; Epinephrine hcl; Erythromycin ethylsuccinate; Iohexol; and Oxycodone   Review of Systems Review of Systems  Constitutional: Negative for chills and fever.  HENT: Positive for rhinorrhea. Negative for ear pain and sore throat.   Eyes: Negative for pain and visual disturbance.  Respiratory: Positive for cough and shortness of breath.   Cardiovascular: Positive for chest pain. Negative for palpitations.  Gastrointestinal: Negative for abdominal pain and vomiting.  Genitourinary: Negative for dysuria and hematuria.  Musculoskeletal: Negative for arthralgias and back pain.  Skin: Negative for color change and rash.  Neurological: Negative for seizures and syncope.  All other systems reviewed and are negative.    Physical Exam Updated Vital Signs BP (!) 118/98   Pulse 96   Temp 98.9 F (37.2 C) (Oral)   Resp 14   SpO2 98%   Physical Exam  Constitutional: She is oriented to person, place, and time. She appears well-developed and well-nourished. No distress.  HENT:  Head: Normocephalic and atraumatic.  Eyes: Conjunctivae are normal.  Neck: Neck supple.  Cardiovascular: Normal rate and regular rhythm.  No murmur  heard. Pulmonary/Chest: Effort normal and breath sounds normal. No stridor. No respiratory distress. She has no decreased breath sounds. She has no wheezes.  Abdominal: Soft. She exhibits no distension. There is no tenderness.  Musculoskeletal: She exhibits no edema or tenderness.  Neurological: She is alert and oriented to person, place, and time.  Skin: Skin is warm and dry.  Psychiatric: She has a normal mood and affect.  Nursing note and vitals reviewed.    ED Treatments / Results  Labs (all labs ordered are listed, but only abnormal results are displayed) Labs Reviewed  BASIC METABOLIC PANEL - Abnormal; Notable for the following components:      Result Value   Glucose, Bld 252 (*)    GFR calc non Af Amer 57 (*)    All other components within normal limits  CBC  I-STAT TROPONIN, ED    EKG EKG Interpretation  Date/Time:  Sunday Dec 14 2017 22:51:59 EDT Ventricular Rate:  83 PR Interval:  152 QRS Duration: 83 QT Interval:  398 QTC Calculation: 468 R Axis:   33 Text Interpretation:  Sinus rhythm Probable left atrial enlargement When compared with ECG of EARLIER SAME DATE No significant change was found Confirmed by Delora Fuel ([id number]) on 12/14/2017 10:58:42 PM   Radiology Dg Chest 2 View  Result Date: 12/14/2017 CLINICAL DATA:  Subacute onset of generalized chest pain and shortness of breath. EXAM: CHEST - 2 VIEW COMPARISON:  Chest radiograph performed 03/21/2017 FINDINGS: The lungs are well-aerated. Peribronchial thickening is noted. There is no evidence of focal opacification, pleural effusion or pneumothorax. The heart is normal in size; the mediastinal contour is within normal limits. No acute osseous abnormalities are seen. There is chronic resection or resorption of the distal right clavicle. IMPRESSION: Peribronchial thickening noted.  Lungs otherwise clear. Electronically Signed   By: Garald Balding M.D.   On: 12/14/2017 18:51    Procedures Procedures (including  critical care time)  Medications Ordered in ED Medications  albuterol (PROVENTIL) (2.5 MG/3ML) 0.083% nebulizer solution  5 mg (5 mg Nebulization Given 12/14/17 2124)     Initial Impression / Assessment and Plan / ED Course  I have reviewed the triage vital signs and the nursing notes.  Pertinent labs & imaging results that were available during my care of the patient were reviewed by me and considered in my medical decision making (see chart for details).    Patient is a 72 year old female with complicated medical history as above who presents with 1-1/2 weeks of cough.  She denies any fever or chills.  Her pulmonologist called in a prescription for Augmentin on Friday.  She has been on this now for 2 days but has not noted any improvement.  She is coughing up sputum.  Here, she is overall well-appearing.  Her lung exam is clear.  She did have mild improvement with a breathing treatment.  Chest x-ray shows no evidence of pneumonia but there is peribronchial thickening.  Her labs are reassuring.  We will treat her with a short course of prednisone and have her follow-up with her pulmonologist.  We will also prescribe pro-air as she has been using her late husband's medications.  Return precautions discussed in detail.  Patient discharged in stable condition.  Final Clinical Impressions(s) / ED Diagnoses   Final diagnoses:  Cough  Acute viral bronchiolitis    ED Discharge Orders        Ordered    predniSONE (DELTASONE) 20 MG tablet  Daily     12/14/17 2318    predniSONE (DELTASONE) 10 MG tablet  Daily,   Status:  Discontinued     12/14/17 2322    albuterol (PROAIR HFA) 108 (90 Base) MCG/ACT inhaler  Every 6 hours PRN     12/14/17 2323    predniSONE (DELTASONE) 10 MG tablet  Daily     12/14/17 2326       Clifton James, MD 12/14/17 2329    Varney Biles, MD 12/15/17 [phone]

## 2017-12-22 ENCOUNTER — Encounter: Payer: Self-pay | Admitting: Pulmonary Disease

## 2017-12-22 ENCOUNTER — Ambulatory Visit (INDEPENDENT_AMBULATORY_CARE_PROVIDER_SITE_OTHER): Payer: Medicare Other | Admitting: Pulmonary Disease

## 2017-12-22 ENCOUNTER — Ambulatory Visit: Payer: Medicare Other | Admitting: Internal Medicine

## 2017-12-22 VITALS — BP 114/64 | HR 79 | Ht 64.5 in | Wt 218.2 lb

## 2017-12-22 DIAGNOSIS — R05 Cough: Secondary | ICD-10-CM

## 2017-12-22 DIAGNOSIS — G4733 Obstructive sleep apnea (adult) (pediatric): Secondary | ICD-10-CM

## 2017-12-22 DIAGNOSIS — J3089 Other allergic rhinitis: Secondary | ICD-10-CM

## 2017-12-22 DIAGNOSIS — R059 Cough, unspecified: Secondary | ICD-10-CM

## 2017-12-22 DIAGNOSIS — J302 Other seasonal allergic rhinitis: Secondary | ICD-10-CM

## 2017-12-22 DIAGNOSIS — J4531 Mild persistent asthma with (acute) exacerbation: Secondary | ICD-10-CM | POA: Diagnosis not present

## 2017-12-22 MED ORDER — BENZONATATE 200 MG PO CAPS: 200.0000 mg | ORAL_CAPSULE | Freq: Three times a day (TID) | ORAL | 1 refills | Status: DC | PRN

## 2017-12-22 MED ORDER — FLUTTER DEVI: 0 refills | Status: DC

## 2017-12-22 NOTE — Assessment & Plan Note (Signed)
Continue your Symbicort as prescribed Flutter valve twice a day Continue Zantac during the day, and Nexium at night Start daily antihistamine / allergy pill  Flonase as prescribed Nasal saline rinses as needed Tessalon Perles to be used to suppress cough

## 2017-12-22 NOTE — Assessment & Plan Note (Signed)
Continue your Symbicort as prescribed Flutter valve twice a day Continue Zantac during the day, and Nexium at night Start daily antihistamine / allergy pill  Flonase as prescribed Nasal saline rinses as needed Restart your CPAP tonight Tessalon Perles to be used to suppress cough

## 2017-12-22 NOTE — Patient Instructions (Addendum)
Continue your Symbicort as prescribed Flutter valve twice a day Continue Zantac during the day, and Nexium at night Start daily antihistamine / allergy pill  Flonase as prescribed Nasal saline rinses as needed Restart your CPAP tonight Tessalon Perles to be used to suppress cough   Cough Instructions . We believe you have a chronic/cyclical cough that is aggravated by reflux , coughing , and drainage.  . Goal is to not Cough or clear throat.  Marland Kitchen Avoid coughing or clearing throat by using:  o non-mint products/sugarless candy o Water o ice chips o Remember NO MINT PRODUCTS    . Medications to use:  o Mucinex DM 1-2 every 12 hrs or Delsym 2 tsp every 12 hrs f or cough o Tessalon Three times a day  As needed  Cough.  o Zyrtec 66m at bedtime o Chlor tabs 424m2 at bedtime  for nasal drip until cough is 100% cough free.     CPAP . Keep up the hard work using your device.  . Do not drive or operate heavy machinery if tired or drowsy.  . Please notify the supply company and office if you are unable to use your device regularly due to missing supplies or machine being broken.  . Work on maintaining a healthy weight and following your recommended nutrition plan  . Maintain proper daily exercise and movement  . Maintaining proper use of your device can also help improve management of other chronic illnesses such as: Blood pressure, blood sugars, and weight management.   CPAP Cleaning:  Clean weekly, with Dawn soap, and bottle brush.  Set up to air dry.       Please contact the office if your symptoms worsen or you have concerns that you are not improving.   Thank you for choosing [facility] for your healthcare, and for allowing [facility] partner with you on your healthcare journey. I am thankful to be able to provide care to you today.   BrWyn QuakerNP-C

## 2017-12-22 NOTE — Progress Notes (Signed)
@Patient  ID: [name] [name], female    DOB: Jul 24, 1945, 72 y.o.   MRN: [id number]  Chief Complaint  Patient presents with  . Hospitalization Follow-up    ED FU / SOB    Referring provider: Jinny Sanders, MD  HPI: 72 year old female never smoker followed for obstructive sleep apnea, history of right lower lung nodule/mucous plug, allergic rhinitis, complicated by insomnia, GERD, IBS, obesity, SVT, diabetes type 2   Recent [facility] Encounters:  05/02/17- 68 yoF widow never smoker followed for OSA, Insomnia,  hx RLL nodule/ mucus plug, complicated by DM 2, GERD, IBS, SVT CPAP auto 4-10/ Advanced OSA; DME [facility]. Pt wears CPAP nightly and DL attached. Will need order for new supplies.  Download 87% compliance averaging 6 hours 30 minutes, AHI 0.5/hour No acute respiratory issues. Over several months is had a feeling of spasm or tightness in the upper airway or lower throat suggestive of laryngospasm. Frequent reflux. Frequent palpitations evaluated by cardiology. Pro air rescue helps, used about twice daily. She thought Bevespi blurred vision- no glaucoma.  11/05/17- 52 yoF widow never smoker followed for OSA, Insomnia,  hx RLL nodule/ mucus plug, complicated by DM 2, GERD, IBS, SVT CPAP auto 4-10/ Advanced ----OSA; DME: [facility] Pt wears CPAP nightly but is having trouble with face mask. DL attached. Pt will need order for new supplies.  Had trouble wearing CPAP while she had the flu.  Mask is not fitting well. Download 93% compliance AHI 0.7/hour.  She still sleeps better with CPAP. Otherwise breathing has been comfortable with no recent need for albuterol or Symbicort, no routine cough.     Tests:  NPSG 07/31/03- AHI 42/ hr, CPAP then titrated to 9. Weight was 200 lbs. Office Spirometry 03/21/17- WNL  Imaging:  12/14/2017-chest x-ray-peribronchial thickening lean thickening noted, lungs otherwise clear 03/21/17-chest x-ray- mild left base subsegmental atelectasis and/or  scarring again, no interim change  Cardiac:  Echocardiogram 01/17/17-WNL, EF 55-60 %   Labs:   Micro:   Chart Review:  12/14/17-emergency room visit-chest pain and cough >>>Plan Short course of prednisone, follow-up with pulmonology, prescribed pro-air, using, breathing treatment in the emergency room     12/22/17 OV / EDFU  Patient presents today for ED follow-up visit.  Patient reporting that her breathing is slightly better.  Patient taking rescue inhaler 2-3 times daily still.  Patient reporting she has enough rescue inhaler since the prescription from the ED.  Patient restarted Symbicort 2 days ago.  Patient is having an occasional dry and wet cough.  Patient reports coughing more when laying flat, as well as after eating.  Patient taking Sudafed over-the-counter right now to help with nasal congestion and symptoms.  Patient adherent to Flonase, as well as Nexium. Patient finished Augmentin and prednisone slightly better.   Allergies  Allergen Reactions  . Doxycycline Hives  . Epinephrine Hcl Other (See Comments)    REACTION: Had tremors when it was given.  . Erythromycin Ethylsuccinate Other (See Comments)    REACTION: abd spasms  . Iohexol Other (See Comments)     Code: HIVES, Desc: pt needs premedicated-- hives on prev contrast study per md office   . Oxycodone Other (See Comments)    "felt like I was dying"    Immunization History  Administered Date(s) Administered  . H1N1 07/25/2008  . Influenza Split 04/22/2011, 06/30/2012  . Influenza Whole 06/24/2006, 05/22/2008  . Influenza,inj,Quad PF,6+ Mos 03/30/2015  . Influenza-Unspecified 05/22/2013  . Pneumococcal Conjugate-13 06/29/2015  . Pneumococcal  Polysaccharide-23 09/20/2016  . Td 06/24/2006, 01/07/2014    Past Medical History:  Diagnosis Date  . Abnormal abdominal CT scan 04/2006   slight abn liver  . Abnormal chest CT 10/07   unable to bx  . Abnormal CT scan, pancreas or bile duct 04/2006   cyst  .  Acute cystitis   . Acute pharyngitis   . Allergic rhinitis, cause unspecified   . Arthritis    hands, feet and back  . Benign neoplasm of colon   . Cyst and pseudocyst of pancreas   . Depressive disorder, not elsewhere classified   . Diverticulitis   . Diverticulosis of colon (without mention of hemorrhage)   . Dysthymic disorder   . Dysuria   . Edema   . Esophageal reflux   . Essential and other specified forms of tremor   . Irritable bowel syndrome   . Obesity, unspecified   . Other diseases of lung, not elsewhere classified   . Other specified cardiac dysrhythmias(427.89)   . Pain in joint, lower leg   . Periapical abscess without sinus   . Polyneuropathy in diabetes(357.2)   . Pure hypercholesterolemia   . Sleep apnea    CPAP- non compliant  . Type II or unspecified type diabetes mellitus with neurological manifestations, not stated as uncontrolled(250.60)   . Type II or unspecified type diabetes mellitus without mention of complication, not stated as uncontrolled   . Unspecified disorder of liver   . Unspecified sleep apnea   . Unspecified urinary incontinence     Tobacco History: Social History   Tobacco Use  Smoking Status Never Smoker  Smokeless Tobacco Never Used   Counseling given: Yes   Outpatient Encounter Medications as of 12/22/2017  Medication Sig  . albuterol (PROAIR HFA) 108 (90 Base) MCG/ACT inhaler Inhale 2 puffs into the lungs every 6 (six) hours as needed for wheezing or shortness of breath.  Marland Kitchen aspirin 81 MG tablet Take 81 mg by mouth daily.  Marland Kitchen atenolol (TENORMIN) 25 MG tablet Take 1 tablet (25 mg total) by mouth daily. (Patient taking differently: Take 12.5 mg by mouth 2 (two) times daily. )  . budesonide-formoterol (SYMBICORT) 80-4.5 MCG/ACT inhaler Inhale 2 puffs into the lungs 2 (two) times daily.  . fluticasone (FLONASE) 50 MCG/ACT nasal spray 1-2 puffs each nostril, once or twice daily for allergy  . furosemide (LASIX) 40 MG tablet Take 40 mg  by mouth daily.  . Insulin Pen Needle (B-D ULTRAFINE III SHORT PEN) 31G X 8 MM MISC Use to inject insulin two times a day.  Dx: E11.9  . NEXIUM 40 MG capsule TAKE 1 CAPSULE BY MOUTH DAILY  . NOVOLIN 70/30 RELION (70-30) 100 UNIT/ML injection Inject 30-50 Units into the skin 3 (three) times daily. 50 units in the morning, 30 units at lunch and 50 units at dinner  . pseudoephedrine (SUDAFED) 30 MG tablet Take 30 mg by mouth every 8 (eight) hours as needed for congestion.  . benzonatate (TESSALON) 200 MG capsule Take 1 capsule (200 mg total) by mouth 3 (three) times daily as needed for cough.  . predniSONE (DELTASONE) 10 MG tablet Take 5 tablets (50 mg total) by mouth daily. (Patient not taking: Reported on 12/22/2017)  . Respiratory Therapy Supplies (FLUTTER) DEVI Twice a day or as needed  . [DISCONTINUED] amoxicillin-clavulanate (AUGMENTIN) 875-125 MG tablet Take 1 tablet by mouth 2 (two) times daily. (Patient not taking: Reported on 12/22/2017)   No facility-administered encounter medications on file as of  12/22/2017.      Review of Systems  Constitutional:  +fatigue, poor sleep  No  weight loss, night sweats,  fevers, chills HEENT:   No headaches,  Difficulty swallowing,  Tooth/dental problems, or  Sore throat, No sneezing, itching, ear ache, nasal congestion, post nasal drip  CV: No chest pain,  orthopnea, PND, swelling in lower extremities, anasarca, dizziness, palpitations, syncope  GI: No heartburn, indigestion, abdominal pain, nausea, vomiting, diarrhea, change in bowel habits, loss of appetite, bloody stools Resp: +cough, tan mucous with occasional blood streaks, dry and wet cough, less coughing from prior to ED, wheezing  No chest wall deformity Skin: no rash, lesions, no skin changes. GU: no dysuria, change in color of urine, no urgency or frequency.  No flank pain, no hematuria  MS: +weakness  No joint pain or swelling.  No decreased range of motion.  No back pain. Psych:  No change in  mood or affect. No depression or anxiety.  No memory loss.   Physical Exam  BP 114/64 (BP Location: Left Wrist, Cuff Size: Normal)   Pulse 79   Ht 5' 4.5" (1.638 m)   Wt 218 lb 3.2 oz (99 kg)   SpO2 95%   BMI 36.88 kg/m   GEN: A/Ox3; pleasant , NAD, well nourished    HEENT:  [place]/AT,  EACs-clear, TMs-wnl, NOSE-clear, THROAT- +post nasal drip, no lesions  NECK:  Supple w/ fair ROM; no JVD; normal carotid impulses w/o bruits;  no lymphadenopathy.    RESP:  + Deep breath elicits cough clear  P & A; w/o, wheezes/ rales/ or rhonchi. no accessory muscle use, no dullness to percussion  CARD:  RRR, no m/r/g, no peripheral edema, pulses intact, no cyanosis or clubbing.  GI:   Soft & nt; nml bowel sounds; no organomegaly or masses detected.   Musco: Warm bil, no deformities or joint swelling noted.   Neuro: alert, no focal deficits noted.    Skin: Warm, no lesions or rashes    Lab Results:  CBC    Component Value Date/Time   WBC 7.9 12/14/2017 1828   RBC 4.57 12/14/2017 1828   HGB 13.8 12/14/2017 1828   HCT 41.5 12/14/2017 1828   PLT 218 12/14/2017 1828   MCV 90.8 12/14/2017 1828   MCH 30.2 12/14/2017 1828   MCHC 33.3 12/14/2017 1828   RDW 13.1 12/14/2017 1828   LYMPHSABS 3.8 09/06/2014 0939   MONOABS 0.7 09/06/2014 0939   EOSABS 0.1 09/06/2014 0939   BASOSABS 0.0 09/06/2014 0939    BMET    Component Value Date/Time   NA 135 12/14/2017 1828   K 4.2 12/14/2017 1828   CL 101 12/14/2017 1828   CO2 23 12/14/2017 1828   GLUCOSE 252 (H) 12/14/2017 1828   BUN 11 12/14/2017 1828   CREATININE 0.97 12/14/2017 1828   CALCIUM 9.1 12/14/2017 1828   GFRNONAA 57 (L) 12/14/2017 1828   GFRAA >60 12/14/2017 1828    BNP No results found for: BNP  ProBNP    Component Value Date/Time   PROBNP 25.0 03/21/2017 1300    Imaging: Dg Chest 2 View  Result Date: 12/14/2017 CLINICAL DATA:  Subacute onset of generalized chest pain and shortness of breath. EXAM: CHEST - 2 VIEW  COMPARISON:  Chest radiograph performed 03/21/2017 FINDINGS: The lungs are well-aerated. Peribronchial thickening is noted. There is no evidence of focal opacification, pleural effusion or pneumothorax. The heart is normal in size; the mediastinal contour is within normal limits. No acute osseous  abnormalities are seen. There is chronic resection or resorption of the distal right clavicle. IMPRESSION: Peribronchial thickening noted.  Lungs otherwise clear. Electronically Signed   By: Garald Balding M.D.   On: 12/14/2017 18:51     Assessment & Plan:   Mild AR flare, GERD, laryngospasm  Acute on chronic continued issues with cough.  Encouraged aggressive GERD treatment, avoidance of triggers that can cause GERD to worsen.  To restart daily antihistamine and ensure that using Flonase regularly.  Continue Symbicort and be adherent to it daily as prescribed.  Tessalon Perles prescription given to help suppress cough.  Patient to follow-up with office when she discovers what sleep aid Dr. Annamaria Boots used to provide for and ask for refill via the triage line.  Seasonal and perennial allergic rhinitis Continue your Symbicort as prescribed Flutter valve twice a day Continue Zantac during the day, and Nexium at night Start daily antihistamine / allergy pill  Flonase as prescribed Nasal saline rinses as needed Tessalon Perles to be used to suppress cough  Obstructive sleep apnea Restart CPAP tonight  Asthmatic bronchitis with exacerbation Continue your Symbicort as prescribed Flutter valve twice a day Continue Zantac during the day, and Nexium at night Start daily antihistamine / allergy pill  Flonase as prescribed Nasal saline rinses as needed Restart your CPAP tonight Tessalon Perles to be used to suppress cough  Cough Continue your Symbicort as prescribed Flutter valve twice a day Continue Zantac during the day, and Nexium at night Start daily antihistamine / allergy pill  Flonase as  prescribed Nasal saline rinses as needed Restart your CPAP tonight Tessalon Perles to be used to suppress cough     Lauraine Rinne, NP 12/22/2017

## 2017-12-22 NOTE — Assessment & Plan Note (Signed)
Restart CPAP tonight

## 2017-12-23 ENCOUNTER — Telehealth: Payer: Self-pay | Admitting: Pulmonary Disease

## 2017-12-23 DIAGNOSIS — S92505D Nondisplaced unspecified fracture of left lesser toe(s), subsequent encounter for fracture with routine healing: Secondary | ICD-10-CM | POA: Diagnosis not present

## 2017-12-23 MED ORDER — BUDESONIDE-FORMOTEROL FUMARATE 80-4.5 MCG/ACT IN AERO: 2.0000 | INHALATION_SPRAY | Freq: Two times a day (BID) | RESPIRATORY_TRACT | 6 refills | Status: DC

## 2017-12-23 NOTE — Telephone Encounter (Signed)
Per Wyn Quaker, NP  12/23/17 12:18 PM  Note    She can continue to use over-the-counter options such as Delsym or Mucinex DM.  Wyn Quaker FNP      Called [facility] on [address], talked with Steffanie Dunn, Pharmacists.  Gave her Brian's recommendations about tessalon.  Called and spoke with Patient about new recommendations. Patient stated understanding and requested Symbicort refill. Symbicort refill sent to preferred pharmacy.  Nothing further needed at this time.

## 2017-12-23 NOTE — Telephone Encounter (Signed)
[facility]  [facility] [place] called to see if we could get an alternate med for tessalon perles.  Patient is sensitive to benzicane. It causes tremors.  Wyn Quaker Please advise

## 2017-12-23 NOTE — Telephone Encounter (Signed)
She can continue to use over-the-counter options such as Delsym or Mucinex DM.  Wyn Quaker FNP

## 2017-12-26 ENCOUNTER — Telehealth: Payer: Self-pay | Admitting: Internal Medicine

## 2017-12-26 MED ORDER — BUDESONIDE-FORMOTEROL FUMARATE 80-4.5 MCG/ACT IN AERO: 2.0000 | INHALATION_SPRAY | Freq: Two times a day (BID) | RESPIRATORY_TRACT | 6 refills | Status: DC

## 2017-12-26 NOTE — Telephone Encounter (Signed)
Rx for Symbicort 80 has been sent to preferred pharmacy. Pt is aware and voiced his understanding.  Nothing further is needed.

## 2017-12-30 ENCOUNTER — Ambulatory Visit: Payer: Medicare Other | Admitting: Physician Assistant

## 2018-01-05 ENCOUNTER — Other Ambulatory Visit: Payer: Self-pay | Admitting: Physician Assistant

## 2018-01-20 DIAGNOSIS — M79672 Pain in left foot: Secondary | ICD-10-CM | POA: Diagnosis not present

## 2018-01-20 DIAGNOSIS — S92505D Nondisplaced unspecified fracture of left lesser toe(s), subsequent encounter for fracture with routine healing: Secondary | ICD-10-CM | POA: Diagnosis not present

## 2018-03-13 ENCOUNTER — Telehealth: Payer: Self-pay

## 2018-03-13 NOTE — Telephone Encounter (Signed)
Patient has an appt on 04/23/18 (the soonest patient wanted to come in) for routine follow up . LOV 12/2016. 7 gaps to address per [facility]. Patient wants to know if she needs to have labs and if so does she need to fast? She is going to get labs done when she comes in for that appt, she does not want to make 2 separate trips. Please review. Thank you-Rebecah Dangerfield V Nichol Ator, RMA  And I did try to schedule patient for Wellness Visit but she declined that part

## 2018-03-13 NOTE — Telephone Encounter (Signed)
Left message for [name] [name] that we can do her labs at her office visit with Dr. Diona Browner on 04/29/2018.  I ask that she fast at least 4 hours prior to her appointment.

## 2018-03-13 NOTE — Telephone Encounter (Signed)
Yes let pt know she needs to be fasting for 4 hours. Will order labs at office visit.  Make sure appt made is for 30 min

## 2018-04-23 ENCOUNTER — Encounter: Payer: Self-pay | Admitting: *Deleted

## 2018-04-23 ENCOUNTER — Ambulatory Visit: Payer: Medicare Other | Admitting: Family Medicine

## 2018-04-23 DIAGNOSIS — Z0289 Encounter for other administrative examinations: Secondary | ICD-10-CM

## 2018-05-12 ENCOUNTER — Ambulatory Visit: Payer: Self-pay

## 2018-05-12 NOTE — Telephone Encounter (Signed)
Returned call to patient who called to say she had stepped on staples and had to pull them from her left heel this AM. Pt states that she sees no wound. She is diabetic and was wondering if she needed a tetanus booster.  Pt states that she feels pain at 5 on 10 scale with pressure to her foot. She sees no redness or bleeding to the area. Appointment per protocol. Care advice read to patient. Pt verbalized understanding of all instructions.   Reason for Disposition . [1] Has diabetes (diabetes mellitus) AND [2] minor cut or scrape  Answer Assessment - Initial Assessment Questions 1. MECHANISM: "How did the injury happen?" (e.g., twisting injury, direct blow)      Stepped on staples 2. ONSET: "When did the injury happen?" (Minutes or hours ago)      This Am 3. LOCATION: "Where is the injury located?"      Left heel 4. APPEARANCE of INJURY: "What does the injury look like?"      Several areas to heel 5. WEIGHT-BEARING: "Can you put weight on that foot?" "Can you walk (four steps or more)?"       yes 6. SIZE: For cuts, bruises, or swelling, ask: "How large is it?" (e.g., inches or centimeters;  entire joint)      Not seen 7. PAIN: "Is there pain?" If so, ask: "How bad is the pain?"    (e.g., Scale 1-10; or mild, moderate, severe)     5 8. TETANUS: For any breaks in the skin, ask: "When was the last tetanus booster?"     2015 9. OTHER SYMPTOMS: "Do you have any other symptoms?"      no 10. PREGNANCY: "Is there any chance you are pregnant?" "When was your last menstrual period?"       No  Protocols used: FOOT AND ANKLE INJURY-A-AH

## 2018-05-14 ENCOUNTER — Ambulatory Visit (INDEPENDENT_AMBULATORY_CARE_PROVIDER_SITE_OTHER): Payer: Medicare Other | Admitting: Internal Medicine

## 2018-05-14 ENCOUNTER — Encounter: Payer: Self-pay | Admitting: Internal Medicine

## 2018-05-14 VITALS — BP 122/80 | HR 69 | Temp 98.3°F | Wt 220.0 lb

## 2018-05-14 DIAGNOSIS — E1165 Type 2 diabetes mellitus with hyperglycemia: Secondary | ICD-10-CM | POA: Diagnosis not present

## 2018-05-14 DIAGNOSIS — R110 Nausea: Secondary | ICD-10-CM | POA: Diagnosis not present

## 2018-05-14 DIAGNOSIS — Z794 Long term (current) use of insulin: Secondary | ICD-10-CM | POA: Diagnosis not present

## 2018-05-14 DIAGNOSIS — Z23 Encounter for immunization: Secondary | ICD-10-CM | POA: Diagnosis not present

## 2018-05-14 DIAGNOSIS — R1030 Lower abdominal pain, unspecified: Secondary | ICD-10-CM

## 2018-05-14 DIAGNOSIS — K5792 Diverticulitis of intestine, part unspecified, without perforation or abscess without bleeding: Secondary | ICD-10-CM | POA: Diagnosis not present

## 2018-05-14 DIAGNOSIS — S91332A Puncture wound without foreign body, left foot, initial encounter: Secondary | ICD-10-CM

## 2018-05-14 MED ORDER — AMOXICILLIN-POT CLAVULANATE 875-125 MG PO TABS: 1.0000 | ORAL_TABLET | Freq: Two times a day (BID) | ORAL | 0 refills | Status: DC

## 2018-05-14 NOTE — Patient Instructions (Signed)
Puncture Wound A puncture wound is an injury that is caused by a sharp, thin object that goes through your skin, such as a nail. A puncture wound usually does not leave a large opening in your skin, so it may not bleed a lot. However, when you get a puncture wound, dirt or other materials (foreign bodies) can be forced into your wound and break off inside. This makes it more likely that an infection will happen, such as tetanus. Follow these instructions at home: Medicines  Take or apply over-the-counter and prescription medicines only as told by your doctor.  If you were prescribed an antibiotic medicine, take or apply it as told by your doctor. Do not stop using the antibiotic even if your condition starts to get better. Wound care  There are many ways to close and cover a wound. For example, a wound can be covered with stitches (sutures), skin glue, or adhesive strips. Follow instructions from your doctor about: ? How to take care of your wound. ? When and how you should change your bandage (dressing). ? When you should remove your bandage. ? Removing whatever was used to close your wound.  Keep the bandage dry as told by your doctor. Do not take baths, swim, use a hot tub, or do anything that would put your wound underwater until your doctor says it is okay.  Clean the wound as told by your doctor.  Do not scratch or pick at the wound.  Check your wound every day for signs of infection. Watch for: ? Redness, swelling, or pain. ? Fluid, blood, or pus. General instructions  Raise (elevate) the injured area above the level of your heart while you are sitting or lying down.  If your puncture wound is in your foot, ask your doctor if you need to avoid putting weight on your foot and for how long.  Keep all follow-up visits as told by your doctor. This is important. Contact a doctor if:  You got a tetanus shot and you have any of these problems at the injection  site: ? Swelling. ? Very bad pain. ? Redness. ? Bleeding.  You have a fever.  Your stitches come out.  You notice a bad smell coming from your wound or your bandage.  You notice something coming out of the wound, such as wood or glass.  Medicine does not help your pain.  You have more redness, swelling, or pain at the site of your wound.  You have fluid, blood, or pus coming from your wound.  You notice a change in the color of your skin near your wound.  You need to change the bandage often because fluid, blood, or pus is coming from the wound.  You start to have a new rash.  You start to have numbness around the wound. Get help right away if:  You have very bad swelling around the wound.  Your pain suddenly gets worse and is very bad.  You start to get painful skin lumps.  You have a red streak going away from your wound.  The wound is on your hand or foot and you cannot move a finger or toe like you usually can.  The wound is on your hand or foot and you notice that your fingers or toes look pale or bluish. This information is not intended to replace advice given to you by your health care provider. Make sure you discuss any questions you have with your health care provider. Document  Released: 04/16/2008 Document Revised: 12/14/2015 Document Reviewed: 08/31/2014 Elsevier Interactive Patient Education  [employer].

## 2018-05-14 NOTE — Progress Notes (Signed)
Subjective:    Patient ID: [name] [name], female    DOB: Nov 24, 1945, 72 y.o.   MRN: [id number]  HPI  Pt presents to the clinic today with c/o wound to her left heel. She reports 2 days prior, she stepped on some staples. They were not rusty. She denies heel pain, redness, warmth or drainage. She cleansed the area and covered it with triple antibiotic ointment. She is concerned because she is diabetic and she is not sure when her last tetanus was. Per Epic, her last tetanus was in 2015.  She also reports lower back pain and bilateral lower abdominal pain. She reports this started 2 weeks ago. She describes the pain as cramping. She reports associated nausea, but denies vomiting, diarrhea, constipation or blood in her stool. She denies urinary or vaginal complaints. She reports she has had diverticulitis in the past, and this feels the same to her. She has not taken anything OTC for her symptoms .  Review of Systems  Past Medical History:  Diagnosis Date  . Abnormal abdominal CT scan 04/2006   slight abn liver  . Abnormal chest CT 10/07   unable to bx  . Abnormal CT scan, pancreas or bile duct 04/2006   cyst  . Acute cystitis   . Acute pharyngitis   . Allergic rhinitis, cause unspecified   . Arthritis    hands, feet and back  . Benign neoplasm of colon   . Cyst and pseudocyst of pancreas   . Depressive disorder, not elsewhere classified   . Diverticulitis   . Diverticulosis of colon (without mention of hemorrhage)   . Dysthymic disorder   . Dysuria   . Edema   . Esophageal reflux   . Essential and other specified forms of tremor   . Irritable bowel syndrome   . Obesity, unspecified   . Other diseases of lung, not elsewhere classified   . Other specified cardiac dysrhythmias(427.89)   . Pain in joint, lower leg   . Periapical abscess without sinus   . Polyneuropathy in diabetes(357.2)   . Pure hypercholesterolemia   . Sleep apnea    CPAP- non compliant  . Type II  or unspecified type diabetes mellitus with neurological manifestations, not stated as uncontrolled(250.60)   . Type II or unspecified type diabetes mellitus without mention of complication, not stated as uncontrolled   . Unspecified disorder of liver   . Unspecified sleep apnea   . Unspecified urinary incontinence     Current Outpatient Medications  Medication Sig Dispense Refill  . albuterol (PROAIR HFA) 108 (90 Base) MCG/ACT inhaler Inhale 2 puffs into the lungs every 6 (six) hours as needed for wheezing or shortness of breath. 1 Inhaler 0  . aspirin 81 MG tablet Take 81 mg by mouth daily.    Marland Kitchen atenolol (TENORMIN) 25 MG tablet TAKE 1 TABLET BY MOUTH EVERY DAY 30 tablet 11  . budesonide-formoterol (SYMBICORT) 80-4.5 MCG/ACT inhaler Inhale 2 puffs into the lungs 2 (two) times daily. 1 Inhaler 6  . fluticasone (FLONASE) 50 MCG/ACT nasal spray 1-2 puffs each nostril, once or twice daily for allergy 16 g prn  . furosemide (LASIX) 40 MG tablet Take 40 mg by mouth daily.    . Insulin Pen Needle (B-D ULTRAFINE III SHORT PEN) 31G X 8 MM MISC Use to inject insulin two times a day.  Dx: E11.9 100 each 0  . NEXIUM 40 MG capsule TAKE 1 CAPSULE BY MOUTH DAILY 90 capsule 1  .  NOVOLIN 70/30 RELION (70-30) 100 UNIT/ML injection Inject 30-50 Units into the skin 3 (three) times daily. 50 units in the morning, 30 units at lunch and 50 units at dinner    . pseudoephedrine (SUDAFED) 30 MG tablet Take 30 mg by mouth every 8 (eight) hours as needed for congestion.    Marland Kitchen Respiratory Therapy Supplies (FLUTTER) DEVI Twice a day or as needed 1 each 0  . amoxicillin-clavulanate (AUGMENTIN) 875-125 MG tablet Take 1 tablet by mouth 2 (two) times daily. 20 tablet 0   No current facility-administered medications for this visit.     Allergies  Allergen Reactions  . Doxycycline Hives  . Epinephrine Hcl Other (See Comments)    REACTION: Had tremors when it was given.  . Erythromycin Ethylsuccinate Other (See Comments)     REACTION: abd spasms  . Iohexol Other (See Comments)     Code: HIVES, Desc: pt needs premedicated-- hives on prev contrast study per md office   . Oxycodone Other (See Comments)    "felt like I was dying"    Family History  Problem Relation Age of Onset  . Clotting disorder Mother   . Cirrhosis Father   . Diabetes Unknown        2 aunts  . Cancer Unknown        grandmother  . Allergies Unknown        "whole family"  . Asthma Unknown        "whole family"    Social History   Socioeconomic History  . Marital status: Married    Spouse name: Not on file  . Number of children: 5  . Years of education: Not on file  . Highest education level: Not on file  Occupational History    Employer: RETIRED  Social Needs  . Financial resource strain: Not on file  . Food insecurity:    Worry: Not on file    Inability: Not on file  . Transportation needs:    Medical: Not on file    Non-medical: Not on file  Tobacco Use  . Smoking status: Never Smoker  . Smokeless tobacco: Never Used  Substance and Sexual Activity  . Alcohol use: No    Alcohol/week: 0.0 standard drinks  . Drug use: No  . Sexual activity: Not on file  Lifestyle  . Physical activity:    Days per week: Not on file    Minutes per session: Not on file  . Stress: Not on file  Relationships  . Social connections:    Talks on phone: Not on file    Gets together: Not on file    Attends religious service: Not on file    Active member of club or organization: Not on file    Attends meetings of clubs or organizations: Not on file    Relationship status: Not on file  . Intimate partner violence:    Fear of current or ex partner: Not on file    Emotionally abused: Not on file    Physically abused: Not on file    Forced sexual activity: Not on file  Other Topics Concern  . Not on file  Social History Narrative   Husband treated for testicular cancer, 2 cups coffee a day, no exercise     Constitutional: Denies  fever, malaise, fatigue, headache or abrupt weight changes.  Respiratory: Denies difficulty breathing, shortness of breath, cough or sputum production.   Cardiovascular: Denies chest pain, chest tightness, palpitations or swelling in the  hands or feet.  Gastrointestinal: Pt reports abdominal pain, nausea. Denies bloating, constipation, diarrhea or blood in the stool.  GU: Denies urgency, frequency, pain with urination, burning sensation, blood in urine, odor or discharge. Musculoskeletal: Pt reports low back pain. Denies decrease in range of motion, difficulty with gait, or joint pain and swelling.  Skin: Pt reports puncture wound to left heel. Denies redness, rashes, lesions or ulcercations.   No other specific complaints in a complete review of systems (except as listed in HPI above).     Objective:   Physical Exam  BP 122/80   Pulse 69   Temp 98.3 F (36.8 C) (Oral)   Wt 220 lb (99.8 kg)   SpO2 98%   BMI 37.18 kg/m  Wt Readings from Last 3 Encounters:  05/14/18 220 lb (99.8 kg)  12/22/17 218 lb 3.2 oz (99 kg)  11/05/17 224 lb (101.6 kg)    General: Appears her stated age, obese in NAD. Skin: 2 small puncture wounds to bottom of left heel. No redness, warmth or drainage noted. Abdomen: Soft and mildly tender in bilateral lower quadrants. Normal bowel sounds. No distention or masses noted. No CVA tenderness noted. Musculoskeletal: Normal flexion, extension and rotation of the spine. No bony tenderness noted over the lumbar spine or bilateral paralumbar muscles.    BMET    Component Value Date/Time   NA 135 12/14/2017 1828   K 4.2 12/14/2017 1828   CL 101 12/14/2017 1828   CO2 23 12/14/2017 1828   GLUCOSE 252 (H) 12/14/2017 1828   BUN 11 12/14/2017 1828   CREATININE 0.97 12/14/2017 1828   CALCIUM 9.1 12/14/2017 1828   GFRNONAA 57 (L) 12/14/2017 1828   GFRAA >60 12/14/2017 1828    Lipid Panel     Component Value Date/Time   CHOL 162 01/07/2017 0955   TRIG 203.0 (H)  01/07/2017 0955   HDL 38.20 (L) 01/07/2017 0955   CHOLHDL 4 01/07/2017 0955   VLDL 40.6 (H) 01/07/2017 0955   LDLCALC 123 (H) 07/03/2012 1243    CBC    Component Value Date/Time   WBC 7.9 12/14/2017 1828   RBC 4.57 12/14/2017 1828   HGB 13.8 12/14/2017 1828   HCT 41.5 12/14/2017 1828   PLT 218 12/14/2017 1828   MCV 90.8 12/14/2017 1828   MCH 30.2 12/14/2017 1828   MCHC 33.3 12/14/2017 1828   RDW 13.1 12/14/2017 1828   LYMPHSABS 3.8 09/06/2014 0939   MONOABS 0.7 09/06/2014 0939   EOSABS 0.1 09/06/2014 0939   BASOSABS 0.0 09/06/2014 0939    Hgb A1C Lab Results  Component Value Date   HGBA1C 8.4 (H) 01/07/2017            Assessment & Plan:   Puncture Wound of Left Heel, DM 2:  No concern for infection Tetanus UTD Will monitor  Abdominal Pain, Nausea, Diverticulitis:  She reports she does not do well with Cipro Will treat with Augmentin BID x 10 days Encouraged clear liquid diet, advance as tolerated  Return precautions disucssed Webb Silversmith, NP

## 2018-05-16 ENCOUNTER — Encounter: Payer: Self-pay | Admitting: Internal Medicine

## 2018-05-27 ENCOUNTER — Telehealth: Payer: Self-pay | Admitting: Family Medicine

## 2018-05-27 NOTE — Telephone Encounter (Signed)
PT is unhappy about the $50 no show she received in the mail. Pt stated she did schedule the appt on 04/23/18 with Dr.Bedsole

## 2018-06-03 NOTE — Telephone Encounter (Signed)
I spoke with pt and she is aware Dr Diona Browner will waive the $50 fee from 04/23/18.

## 2018-06-11 DIAGNOSIS — E1165 Type 2 diabetes mellitus with hyperglycemia: Secondary | ICD-10-CM | POA: Diagnosis not present

## 2018-06-11 DIAGNOSIS — E7800 Pure hypercholesterolemia, unspecified: Secondary | ICD-10-CM | POA: Diagnosis not present

## 2018-06-11 DIAGNOSIS — I10 Essential (primary) hypertension: Secondary | ICD-10-CM | POA: Diagnosis not present

## 2018-06-11 DIAGNOSIS — R740 Nonspecific elevation of levels of transaminase and lactic acid dehydrogenase [LDH]: Secondary | ICD-10-CM | POA: Diagnosis not present

## 2018-06-11 LAB — MICROALBUMIN, URINE: Microalb, Ur: 5.3

## 2018-10-08 DIAGNOSIS — I10 Essential (primary) hypertension: Secondary | ICD-10-CM | POA: Diagnosis not present

## 2018-10-08 DIAGNOSIS — R740 Nonspecific elevation of levels of transaminase and lactic acid dehydrogenase [LDH]: Secondary | ICD-10-CM | POA: Diagnosis not present

## 2018-10-08 DIAGNOSIS — E7800 Pure hypercholesterolemia, unspecified: Secondary | ICD-10-CM | POA: Diagnosis not present

## 2018-10-08 DIAGNOSIS — E1165 Type 2 diabetes mellitus with hyperglycemia: Secondary | ICD-10-CM | POA: Diagnosis not present

## 2018-10-08 LAB — HEMOGLOBIN A1C: Hemoglobin A1C: 8.4

## 2018-10-22 ENCOUNTER — Encounter: Payer: Self-pay | Admitting: Internal Medicine

## 2018-11-06 ENCOUNTER — Ambulatory Visit: Payer: Medicare Other | Admitting: Internal Medicine

## 2018-11-16 DIAGNOSIS — R740 Nonspecific elevation of levels of transaminase and lactic acid dehydrogenase [LDH]: Secondary | ICD-10-CM | POA: Diagnosis not present

## 2018-11-16 DIAGNOSIS — K7460 Unspecified cirrhosis of liver: Secondary | ICD-10-CM | POA: Diagnosis not present

## 2018-11-16 DIAGNOSIS — E1165 Type 2 diabetes mellitus with hyperglycemia: Secondary | ICD-10-CM | POA: Diagnosis not present

## 2018-11-16 DIAGNOSIS — K862 Cyst of pancreas: Secondary | ICD-10-CM | POA: Diagnosis not present

## 2018-11-17 ENCOUNTER — Other Ambulatory Visit: Payer: Self-pay | Admitting: Gastroenterology

## 2018-11-17 DIAGNOSIS — Z8719 Personal history of other diseases of the digestive system: Secondary | ICD-10-CM

## 2018-11-20 DIAGNOSIS — E1165 Type 2 diabetes mellitus with hyperglycemia: Secondary | ICD-10-CM | POA: Diagnosis not present

## 2018-11-23 ENCOUNTER — Ambulatory Visit
Admission: RE | Admit: 2018-11-23 | Discharge: 2018-11-23 | Disposition: A | Discharge status: Home / Self Care | Payer: Medicare Other | Source: Ambulatory Visit | Attending: Gastroenterology | Admitting: Gastroenterology

## 2018-11-23 ENCOUNTER — Other Ambulatory Visit: Payer: Self-pay

## 2018-11-23 DIAGNOSIS — E1165 Type 2 diabetes mellitus with hyperglycemia: Secondary | ICD-10-CM | POA: Diagnosis not present

## 2018-11-23 DIAGNOSIS — Z8719 Personal history of other diseases of the digestive system: Secondary | ICD-10-CM

## 2018-11-26 DIAGNOSIS — E1165 Type 2 diabetes mellitus with hyperglycemia: Secondary | ICD-10-CM | POA: Diagnosis not present

## 2018-12-16 DIAGNOSIS — K754 Autoimmune hepatitis: Secondary | ICD-10-CM | POA: Diagnosis not present

## 2018-12-16 DIAGNOSIS — K7460 Unspecified cirrhosis of liver: Secondary | ICD-10-CM | POA: Diagnosis not present

## 2018-12-21 ENCOUNTER — Other Ambulatory Visit: Payer: Self-pay

## 2018-12-21 ENCOUNTER — Emergency Department (HOSPITAL_COMMUNITY): Payer: Medicare Other

## 2018-12-21 ENCOUNTER — Observation Stay (HOSPITAL_COMMUNITY)
Admission: EM | Admit: 2018-12-21 | Discharge: 2018-12-22 | Disposition: A | Discharge status: Home / Self Care | Payer: Medicare Other | Attending: Internal Medicine | Admitting: Internal Medicine

## 2018-12-21 ENCOUNTER — Encounter (HOSPITAL_COMMUNITY): Payer: Self-pay | Admitting: Emergency Medicine

## 2018-12-21 DIAGNOSIS — R071 Chest pain on breathing: Secondary | ICD-10-CM | POA: Diagnosis not present

## 2018-12-21 DIAGNOSIS — N179 Acute kidney failure, unspecified: Secondary | ICD-10-CM | POA: Insufficient documentation

## 2018-12-21 DIAGNOSIS — Z7951 Long term (current) use of inhaled steroids: Secondary | ICD-10-CM | POA: Diagnosis not present

## 2018-12-21 DIAGNOSIS — Z888 Allergy status to other drugs, medicaments and biological substances status: Secondary | ICD-10-CM | POA: Insufficient documentation

## 2018-12-21 DIAGNOSIS — I493 Ventricular premature depolarization: Secondary | ICD-10-CM | POA: Diagnosis not present

## 2018-12-21 DIAGNOSIS — R079 Chest pain, unspecified: Secondary | ICD-10-CM | POA: Diagnosis not present

## 2018-12-21 DIAGNOSIS — K219 Gastro-esophageal reflux disease without esophagitis: Secondary | ICD-10-CM | POA: Diagnosis not present

## 2018-12-21 DIAGNOSIS — R739 Hyperglycemia, unspecified: Secondary | ICD-10-CM | POA: Diagnosis present

## 2018-12-21 DIAGNOSIS — Z79899 Other long term (current) drug therapy: Secondary | ICD-10-CM | POA: Diagnosis not present

## 2018-12-21 DIAGNOSIS — R072 Precordial pain: Secondary | ICD-10-CM | POA: Diagnosis not present

## 2018-12-21 DIAGNOSIS — Z6837 Body mass index (BMI) 37.0-37.9, adult: Secondary | ICD-10-CM | POA: Insufficient documentation

## 2018-12-21 DIAGNOSIS — I471 Supraventricular tachycardia: Secondary | ICD-10-CM | POA: Diagnosis not present

## 2018-12-21 DIAGNOSIS — E1165 Type 2 diabetes mellitus with hyperglycemia: Secondary | ICD-10-CM | POA: Diagnosis present

## 2018-12-21 DIAGNOSIS — Z1159 Encounter for screening for other viral diseases: Secondary | ICD-10-CM | POA: Diagnosis not present

## 2018-12-21 DIAGNOSIS — Z833 Family history of diabetes mellitus: Secondary | ICD-10-CM | POA: Diagnosis not present

## 2018-12-21 DIAGNOSIS — E1142 Type 2 diabetes mellitus with diabetic polyneuropathy: Secondary | ICD-10-CM | POA: Insufficient documentation

## 2018-12-21 DIAGNOSIS — E669 Obesity, unspecified: Secondary | ICD-10-CM | POA: Insufficient documentation

## 2018-12-21 DIAGNOSIS — K754 Autoimmune hepatitis: Secondary | ICD-10-CM | POA: Insufficient documentation

## 2018-12-21 DIAGNOSIS — Z881 Allergy status to other antibiotic agents status: Secondary | ICD-10-CM | POA: Diagnosis not present

## 2018-12-21 DIAGNOSIS — R740 Nonspecific elevation of levels of transaminase and lactic acid dehydrogenase [LDH]: Secondary | ICD-10-CM | POA: Diagnosis not present

## 2018-12-21 DIAGNOSIS — Z885 Allergy status to narcotic agent status: Secondary | ICD-10-CM | POA: Insufficient documentation

## 2018-12-21 DIAGNOSIS — K589 Irritable bowel syndrome without diarrhea: Secondary | ICD-10-CM | POA: Insufficient documentation

## 2018-12-21 DIAGNOSIS — G4733 Obstructive sleep apnea (adult) (pediatric): Secondary | ICD-10-CM | POA: Diagnosis not present

## 2018-12-21 DIAGNOSIS — I10 Essential (primary) hypertension: Secondary | ICD-10-CM | POA: Diagnosis not present

## 2018-12-21 DIAGNOSIS — F329 Major depressive disorder, single episode, unspecified: Secondary | ICD-10-CM | POA: Insufficient documentation

## 2018-12-21 DIAGNOSIS — R002 Palpitations: Secondary | ICD-10-CM | POA: Diagnosis not present

## 2018-12-21 DIAGNOSIS — E7800 Pure hypercholesterolemia, unspecified: Secondary | ICD-10-CM | POA: Diagnosis present

## 2018-12-21 DIAGNOSIS — Z794 Long term (current) use of insulin: Secondary | ICD-10-CM | POA: Insufficient documentation

## 2018-12-21 DIAGNOSIS — R7401 Elevation of levels of liver transaminase levels: Secondary | ICD-10-CM

## 2018-12-21 DIAGNOSIS — Z7982 Long term (current) use of aspirin: Secondary | ICD-10-CM | POA: Insufficient documentation

## 2018-12-21 DIAGNOSIS — D696 Thrombocytopenia, unspecified: Secondary | ICD-10-CM | POA: Insufficient documentation

## 2018-12-21 DIAGNOSIS — Z7952 Long term (current) use of systemic steroids: Secondary | ICD-10-CM | POA: Insufficient documentation

## 2018-12-21 HISTORY — DX: Other complications of anesthesia, initial encounter: T88.59XA

## 2018-12-21 LAB — BASIC METABOLIC PANEL
Anion gap: 12 (ref 5–15)
BUN: 29 mg/dL — ABNORMAL HIGH (ref 8–23)
CO2: 24 mmol/L (ref 22–32)
Calcium: 10.6 mg/dL — ABNORMAL HIGH (ref 8.9–10.3)
Chloride: 103 mmol/L (ref 98–111)
Creatinine, Ser: 1.19 mg/dL — ABNORMAL HIGH (ref 0.44–1.00)
GFR calc Af Amer: 53 mL/min — ABNORMAL LOW (ref >60)
GFR calc non Af Amer: 46 mL/min — ABNORMAL LOW (ref >60)
Glucose, Bld: 197 mg/dL — ABNORMAL HIGH (ref 70–99)
Potassium: 3.8 mmol/L (ref 3.5–5.1)
Sodium: 139 mmol/L (ref 135–145)

## 2018-12-21 LAB — CBC WITH DIFFERENTIAL/PLATELET
Abs Immature Granulocytes: 0.04 10*3/uL (ref 0.00–0.07)
Basophils Absolute: 0 10*3/uL (ref 0.0–0.1)
Basophils Relative: 0 %
Eosinophils Absolute: 0 10*3/uL (ref 0.0–0.5)
Eosinophils Relative: 0 %
HCT: 42.2 % (ref 36.0–46.0)
Hemoglobin: 13.9 g/dL (ref 12.0–15.0)
Immature Granulocytes: 1 %
Lymphocytes Relative: 30 %
Lymphs Abs: 2.6 10*3/uL (ref 0.7–4.0)
MCH: 30.6 pg (ref 26.0–34.0)
MCHC: 32.9 g/dL (ref 30.0–36.0)
MCV: 93 fL (ref 80.0–100.0)
Monocytes Absolute: 0.6 10*3/uL (ref 0.1–1.0)
Monocytes Relative: 7 %
Neutro Abs: 5.4 10*3/uL (ref 1.7–7.7)
Neutrophils Relative %: 62 %
Platelets: 139 10*3/uL — ABNORMAL LOW (ref 150–400)
RBC: 4.54 MIL/uL (ref 3.87–5.11)
RDW: 13.9 % (ref 11.5–15.5)
WBC: 8.7 10*3/uL (ref 4.0–10.5)
nRBC: 0 % (ref 0.0–0.2)

## 2018-12-21 LAB — TROPONIN I: Troponin I: <0.03 ng/mL (ref <0.03)

## 2018-12-21 IMAGING — DX PORTABLE CHEST - 1 VIEW
1 series · 1 of 1 positions shown · non-contrast
Comparison: [DATE]

CLINICAL DATA: Chest pain

EXAM:
PORTABLE CHEST 1 VIEW

[chest ap]
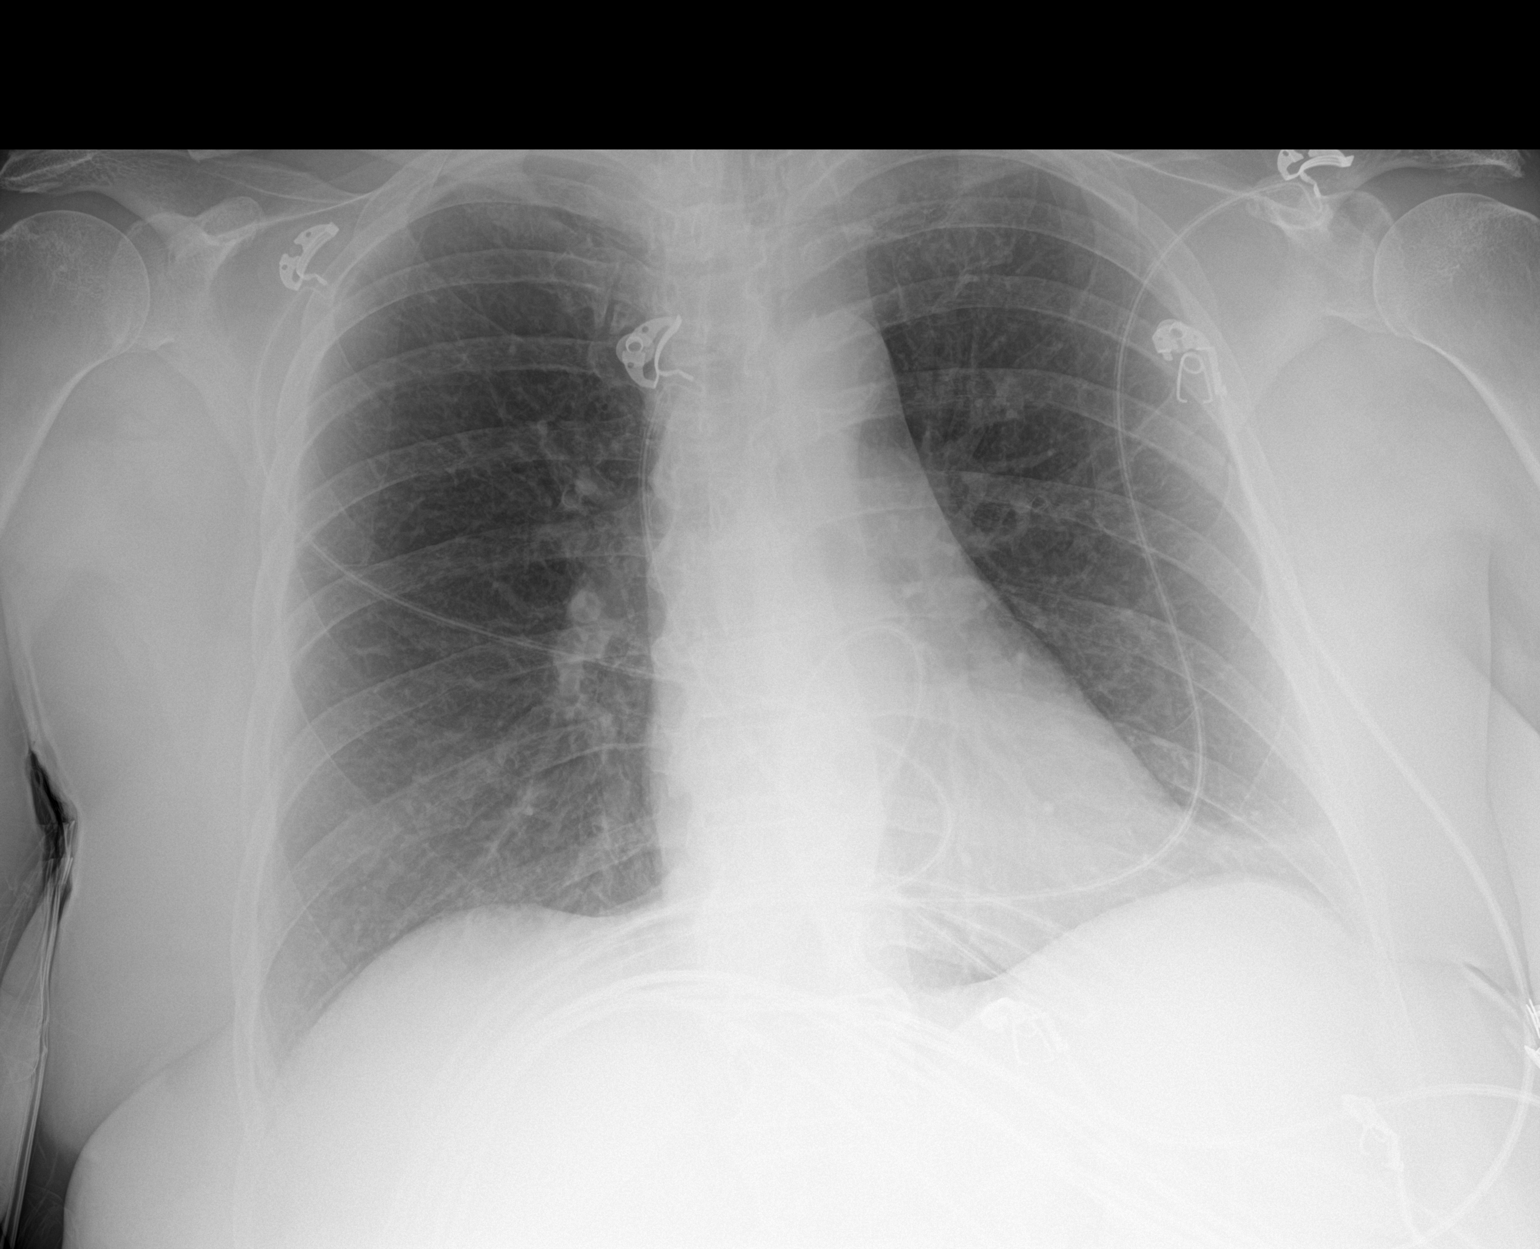

[1 of 1 positions shown; findings below may reference images not displayed]

FINDINGS: The heart size and mediastinal contours are within normal limits.
Both lungs are clear. The visualized skeletal structures are
unremarkable. There is a stable appearance of the distal right
clavicle.
IMPRESSION: No active disease.

## 2018-12-21 NOTE — ED Triage Notes (Signed)
Pt to ED with c/o mid chest pain  Pt described pain as feeling like a bubble, st's pain radiated up into neck and into bil temples.  Pt took 4 baby asa prior to EMS arrival

## 2018-12-21 NOTE — ED Provider Notes (Signed)
[facility] EMERGENCY DEPARTMENT Provider Note  CSN: [id number] Arrival date & time: 12/21/18 2130  Chief Complaint(s) Chest Pain  HPI [name] [name] is a 73 y.o. female with a history of treated diabetes, hypercholesterolemia and obesity presents for evaluation of chest pain. Initial onset of pain was approximately 3-6 hours ago. The patient's chest pain is sharp and is worse with exertion. The patient's chest pain is middle- or left-sided, is not well-localized, is not described as heaviness/pressure/tightness and does radiate to the arms/jaw/neck. The patient does not complain of nausea and denies diaphoresis. The patient has no history of stroke, has no history of peripheral artery disease, has not smoked in the past 90 days, has no relevant family history of coronary artery disease (first degree relative at less than age 65) and is not hypertensive.  HPI  Past Medical History Past Medical History:  Diagnosis Date  . Abnormal abdominal CT scan 04/2006   slight abn liver  . Abnormal chest CT 10/07   unable to bx  . Abnormal CT scan, pancreas or bile duct 04/2006   cyst  . Acute cystitis   . Acute pharyngitis   . Allergic rhinitis, cause unspecified   . Arthritis    hands, feet and back  . Benign neoplasm of colon   . Cyst and pseudocyst of pancreas   . Depressive disorder, not elsewhere classified   . Diverticulitis   . Diverticulosis of colon (without mention of hemorrhage)   . Dysthymic disorder   . Dysuria   . Edema   . Esophageal reflux   . Essential and other specified forms of tremor   . Irritable bowel syndrome   . Obesity, unspecified   . Other diseases of lung, not elsewhere classified   . Other specified cardiac dysrhythmias(427.89)   . Pain in joint, lower leg   . Periapical abscess without sinus   . Polyneuropathy in diabetes(357.2)   . Pure hypercholesterolemia   . Sleep apnea    CPAP- non compliant  . Type II or unspecified type  diabetes mellitus with neurological manifestations, not stated as uncontrolled(250.60)   . Type II or unspecified type diabetes mellitus without mention of complication, not stated as uncontrolled   . Unspecified disorder of liver   . Unspecified sleep apnea   . Unspecified urinary incontinence    Patient Active Problem List   Diagnosis Date Noted  . Chest pain 12/22/2018  . Asthmatic bronchitis with exacerbation 03/21/2017  . Elevated LFTs 12/24/2016  . SVT (supraventricular tachycardia) ([place]) 12/24/2016  . Peripheral edema 12/24/2016  . S/P foot surgery, right 07/27/2015  . Preoperative clearance 06/29/2015  . Adverse reaction to anesthetic agent 06/29/2015  . Cough 05/06/2015  . Hand pain, right 09/06/2014  . Angioedema 08/30/2014  . Recurrent UTI 01/06/2014  . Chronic insomnia 01/06/2014  . Low back pain 03/07/2011  . Right hip pain 03/07/2011  . Vitamin D deficiency 08/20/2010  . 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 Lung nodule 08/03/2007  . CYST AND PSEUDOCYST OF PANCREAS 08/03/2007  . 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  . DISORDER, LIVER NEC 02/22/2007  . 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 HYPERCHOLESTEROLEMIA 12/30/2006  . OBESITY 12/30/2006  . TREMOR, ESSENTIAL 12/30/2006  . Palpitations 12/30/2006  . Seasonal and perennial allergic rhinitis 12/30/2006  . GERD 12/30/2006  . DIVERTICULOSIS, COLON 12/30/2006  . IBS 12/30/2006  . Obstructive sleep apnea 12/30/2006  . URINARY INCONTINENCE 12/30/2006  . Poorly controlled type 2 diabetes mellitus ([place]) 12/22/2006  Home Medication(s) Prior to Admission medications   Medication Sig Start Date End Date Taking? Authorizing Provider  albuterol (PROAIR HFA) 108 (90 Base) MCG/ACT inhaler Inhale 2 puffs into the lungs every 6 (six) hours as needed for wheezing or shortness of breath. 12/14/17   Varney Biles, MD  amoxicillin-clavulanate (AUGMENTIN) 875-125 MG tablet Take 1 tablet by mouth 2 (two) times  daily. 05/14/18   Jearld Fenton, NP  aspirin 81 MG tablet Take 81 mg by mouth daily.    [provider]  atenolol (TENORMIN) 25 MG tablet TAKE 1 TABLET BY MOUTH EVERY DAY 01/05/18   Lorretta Harp, MD  budesonide-formoterol [facility]) 80-4.5 MCG/ACT inhaler Inhale 2 puffs into the lungs 2 (two) times daily. 12/26/17   Deneise Lever, MD  fluticasone (FLONASE) 50 MCG/ACT nasal spray 1-2 puffs each nostril, once or twice daily for allergy 01/29/13   Baird Lyons D, MD  furosemide (LASIX) 40 MG tablet Take 40 mg by mouth daily.    [provider]  Insulin Pen Needle (B-D ULTRAFINE III SHORT PEN) 31G X 8 MM MISC Use to inject insulin two times a day.  Dx: E11.9 12/01/14   Bedsole, Amy E, MD  NEXIUM 40 MG capsule TAKE 1 CAPSULE BY MOUTH DAILY 10/28/12   Bedsole, Amy E, MD  NOVOLIN 70/30 RELION (70-30) 100 UNIT/ML injection Inject 30-50 Units into the skin 3 (three) times daily. 50 units in the morning, 30 units at lunch and 50 units at dinner 12/23/12   [provider]  pseudoephedrine (SUDAFED) 30 MG tablet Take 30 mg by mouth every 8 (eight) hours as needed for congestion.    [provider]  Respiratory Therapy Supplies (FLUTTER) DEVI Twice a day or as needed 12/22/17   Lauraine Rinne, NP                                                                                                                                    Past Surgical History Past Surgical History:  Procedure Laterality Date  . ABDOMINAL HYSTERECTOMY    . CALCANEAL OSTEOTOMY Right 07/27/2015   Procedure: RIGHT CALCANEAL OSTEOTOMY ;  Surgeon: Wylene Simmer, MD;  Location: [place];  Service: Orthopedics;  Laterality: Right;  . CHOLECYSTECTOMY     15-20 years ago  . GASTROCNEMIUS RECESSION Right 07/27/2015   Procedure: RIGHT GASTROC RECESSION;  Surgeon: Wylene Simmer, MD;  Location: [place];  Service: Orthopedics;  Laterality: Right;  . KIDNEY STONE SURGERY  1980's   removal   . 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 PARTIAL HYSTERECTOMY  1990's  . ROTATOR CUFF REPAIR  2005   Family History Family History  Problem Relation Age of Onset  . Clotting disorder Mother   . Cirrhosis Father   . Diabetes Other        2 aunts  . Cancer Other  grandmother  . Allergies Other        "whole family"  . Asthma Other        "whole family"    Social History Social History   Tobacco Use  . Smoking status: Never Smoker  . Smokeless tobacco: Never Used  Substance Use Topics  . Alcohol use: No    Alcohol/week: 0.0 standard drinks  . Drug use: No   Allergies Doxycycline; Epinephrine hcl; Erythromycin ethylsuccinate; Iohexol; and Oxycodone  Review of Systems Review of Systems All other systems are reviewed and are negative for acute change except as noted in the HPI  Physical Exam Vital Signs  I have reviewed the triage vital signs BP (!) 149/64 (BP Location: Right Arm)   Pulse (!) 58   Temp 98.4 F (36.9 C) (Oral)   Resp 12   Ht 5' 4"  (1.626 m)   Wt 100.2 kg   SpO2 95%   BMI 37.93 kg/m   Physical Exam Vitals signs reviewed.  Constitutional:      General: She is not in acute distress.    Appearance: She is well-developed. She is obese. She is not diaphoretic.  HENT:     Head: Normocephalic and atraumatic.     Nose: Nose normal.  Eyes:     General: No scleral icterus.       Right eye: No discharge.        Left eye: No discharge.     Conjunctiva/sclera: Conjunctivae normal.     Pupils: Pupils are equal, round, and reactive to light.  Neck:     Musculoskeletal: Normal range of motion and neck supple.  Cardiovascular:     Rate and Rhythm: Normal rate and regular rhythm.     Heart sounds: No murmur. No friction rub. No gallop.   Pulmonary:     Effort: Pulmonary effort is normal. No respiratory distress.     Breath sounds: Normal breath sounds. No stridor. No rales.  Abdominal:     General: There is no distension.     Palpations:  Abdomen is soft.     Tenderness: There is no abdominal tenderness.  Musculoskeletal:        General: No tenderness.  Skin:    General: Skin is warm and dry.     Findings: No erythema or rash.  Neurological:     Mental Status: She is alert and oriented to person, place, and time.     ED Results and Treatments Labs (all labs ordered are listed, but only abnormal results are displayed) Labs Reviewed  CBC WITH DIFFERENTIAL/PLATELET - Abnormal; Notable for the following components:      Result Value   Platelets 139 (*)    All other components within normal limits  BASIC METABOLIC PANEL - Abnormal; Notable for the following components:   Glucose, Bld 197 (*)    BUN 29 (*)    Creatinine, Ser 1.19 (*)    Calcium 10.6 (*)    GFR calc non Af Amer 46 (*)    GFR calc Af Amer 53 (*)    All other components within normal limits  HEPATIC FUNCTION PANEL - Abnormal; Notable for the following components:   Albumin 3.3 (*)    AST 49 (*)    ALT 72 (*)    Alkaline Phosphatase 139 (*)    All other components within normal limits  LIPASE, BLOOD - Abnormal; Notable for the following components:   Lipase 52 (*)    All other components within  normal limits  SARS CORONAVIRUS 2 (HOSPITAL ORDER, [place] [facility])  TROPONIN I                                                                                                                         EKG  EKG Interpretation  Date/Time:  Monday December 21 2018 21:32:25 EDT Ventricular Rate:  59 PR Interval:    QRS Duration: 91 QT Interval:  413 QTC Calculation: 410 R Axis:   8 Text Interpretation:  Sinus rhythm Abnormal R-wave progression, early transition No significant change since last tracing Reconfirmed by Addison Lank [id number]) on 12/21/2018 11:16:48 PM      Radiology Dg Chest Portable 1 View  Result Date: 12/21/2018 CLINICAL DATA:  Chest pain EXAM: PORTABLE CHEST 1 VIEW COMPARISON:  12/14/2017 FINDINGS: The heart size and  mediastinal contours are within normal limits. Both lungs are clear. The visualized skeletal structures are unremarkable. There is a stable appearance of the distal right clavicle. IMPRESSION: No active disease. Electronically Signed   By: Constance Holster M.D.   On: 12/21/2018 22:25   Pertinent labs & imaging results that were available during my care of the patient were reviewed by me and considered in my medical decision making (see chart for details).  Medications Ordered in ED Medications  nitroGLYCERIN (NITROSTAT) SL tablet 0.4 mg (0.4 mg Sublingual Given 12/22/18 0034)                                                                                                                                    Procedures Procedures  (including critical care time)  Medical Decision Making / ED Course I have reviewed the nursing notes for this encounter and the patient's prior records (if available in EHR or on provided paperwork).    Patient presents with epigastric and substernal chest pain with exertional component.  Pain resolved after 325 of aspirin.  Currently she is chest pain-free.  Patient has had negative stress test many years ago but none since.  Never had a heart catheterization.  EKG without acute ischemic changes or evidence of pericarditis.  Initial troponin negative.  Heart score of 5.  While getting up and going to the bathroom patient had another episode of chest pain which resolved with rest.  Nitroglycerin given.  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  There is a possibility of  esophageal spasms from gastritis due to the prednisone, but given the exertional component, feel the patient warrants admission to rule out ACS.  Low suspicion for pulmonary embolism.  Presentation not classic for aortic dissection or esophageal perforation.  Labs notable for hyper glycemia without evidence of DKA.  Patient also has mild AKI which may be  related to dehydration.  Patient did have mild hypercalcemia.  Reports that she takes calcium supplements at home.  Case discussed with Dr. Hal Hope who will limit the patient for continued work-up and management.  Final Clinical Impression(s) / ED Diagnoses Final diagnoses:  Substernal chest pain  Hyperglycemia  Transaminitis  AKI (acute kidney injury) ([place])      This chart was dictated using voice recognition software.  Despite best efforts to proofread,  errors can occur which can change the documentation meaning.   Fatima Blank, MD 12/22/18 [phone]

## 2018-12-22 ENCOUNTER — Other Ambulatory Visit: Payer: Self-pay

## 2018-12-22 ENCOUNTER — Encounter (HOSPITAL_COMMUNITY): Payer: Self-pay | Admitting: Internal Medicine

## 2018-12-22 ENCOUNTER — Telehealth: Payer: Self-pay | Admitting: Cardiovascular Disease

## 2018-12-22 ENCOUNTER — Observation Stay (HOSPITAL_BASED_OUTPATIENT_CLINIC_OR_DEPARTMENT_OTHER): Payer: Medicare Other

## 2018-12-22 DIAGNOSIS — E1165 Type 2 diabetes mellitus with hyperglycemia: Secondary | ICD-10-CM | POA: Diagnosis not present

## 2018-12-22 DIAGNOSIS — R072 Precordial pain: Secondary | ICD-10-CM | POA: Diagnosis not present

## 2018-12-22 DIAGNOSIS — R079 Chest pain, unspecified: Secondary | ICD-10-CM

## 2018-12-22 LAB — CBC
HCT: 44.8 % (ref 36.0–46.0)
HCT: 41.5 % (ref 36.0–46.0)
Hemoglobin: 14.8 g/dL (ref 12.0–15.0)
Hemoglobin: 13.7 g/dL (ref 12.0–15.0)
MCH: 30.6 pg (ref 26.0–34.0)
MCH: 30.4 pg (ref 26.0–34.0)
MCHC: 33 g/dL (ref 30.0–36.0)
MCHC: 33 g/dL (ref 30.0–36.0)
MCV: 92.8 fL (ref 80.0–100.0)
MCV: 92 fL (ref 80.0–100.0)
Platelets: 153 10*3/uL (ref 150–400)
Platelets: 146 10*3/uL — ABNORMAL LOW (ref 150–400)
RBC: 4.83 MIL/uL (ref 3.87–5.11)
RBC: 4.51 MIL/uL (ref 3.87–5.11)
RDW: 13.9 % (ref 11.5–15.5)
RDW: 13.9 % (ref 11.5–15.5)
WBC: 9.1 10*3/uL (ref 4.0–10.5)
WBC: 7.1 10*3/uL (ref 4.0–10.5)
nRBC: 0 % (ref 0.0–0.2)
nRBC: 0 % (ref 0.0–0.2)

## 2018-12-22 LAB — TROPONIN I
Troponin I: <0.03 ng/mL (ref <0.03)
Troponin I: <0.03 ng/mL (ref <0.03)

## 2018-12-22 LAB — SARS CORONAVIRUS 2 BY RT PCR (HOSPITAL ORDER, PERFORMED IN ~~LOC~~ HOSPITAL LAB): SARS Coronavirus 2: NEGATIVE

## 2018-12-22 LAB — HEPATIC FUNCTION PANEL
ALT: 72 U/L — ABNORMAL HIGH (ref 0–44)
AST: 49 U/L — ABNORMAL HIGH (ref 15–41)
Albumin: 3.3 g/dL — ABNORMAL LOW (ref 3.5–5.0)
Alkaline Phosphatase: 139 U/L — ABNORMAL HIGH (ref 38–126)
Bilirubin, Direct: 0.2 mg/dL (ref 0.0–0.2)
Indirect Bilirubin: 0.7 mg/dL (ref 0.3–0.9)
Total Bilirubin: 0.9 mg/dL (ref 0.3–1.2)
Total Protein: 6.5 g/dL (ref 6.5–8.1)

## 2018-12-22 LAB — NM MYOCAR MULTI W/SPECT W/WALL MOTION / EF
Estimated workload: 1 METS
MPHR: 148 {beats}/min
Peak HR: 85 {beats}/min
Percent HR: 57 %
Rest HR: 60 {beats}/min

## 2018-12-22 LAB — BASIC METABOLIC PANEL
Anion gap: 12 (ref 5–15)
BUN: 25 mg/dL — ABNORMAL HIGH (ref 8–23)
CO2: 26 mmol/L (ref 22–32)
Calcium: 9.5 mg/dL (ref 8.9–10.3)
Chloride: 103 mmol/L (ref 98–111)
Creatinine, Ser: 1.03 mg/dL — ABNORMAL HIGH (ref 0.44–1.00)
GFR calc Af Amer: >60 mL/min (ref >60)
GFR calc non Af Amer: 54 mL/min — ABNORMAL LOW (ref >60)
Glucose, Bld: 139 mg/dL — ABNORMAL HIGH (ref 70–99)
Potassium: 4.1 mmol/L (ref 3.5–5.1)
Sodium: 141 mmol/L (ref 135–145)

## 2018-12-22 LAB — LIPASE, BLOOD: Lipase: 52 U/L — ABNORMAL HIGH (ref 11–51)

## 2018-12-22 LAB — GLUCOSE, CAPILLARY
Glucose-Capillary: 79 mg/dL (ref 70–99)
Glucose-Capillary: 178 mg/dL — ABNORMAL HIGH (ref 70–99)
Glucose-Capillary: 147 mg/dL — ABNORMAL HIGH (ref 70–99)
Glucose-Capillary: 428 mg/dL — ABNORMAL HIGH (ref 70–99)
Glucose-Capillary: 490 mg/dL — ABNORMAL HIGH (ref 70–99)

## 2018-12-22 LAB — CREATININE, SERUM
Creatinine, Ser: 1.16 mg/dL — ABNORMAL HIGH (ref 0.44–1.00)
GFR calc Af Amer: 54 mL/min — ABNORMAL LOW (ref >60)
GFR calc non Af Amer: 47 mL/min — ABNORMAL LOW (ref >60)

## 2018-12-22 MED ORDER — TECHNETIUM TC 99M TETROFOSMIN IV KIT
30.0000 | PACK | Freq: Once | INTRAVENOUS | Status: AC | PRN
  Administered 2018-12-22: 30 via INTRAVENOUS

## 2018-12-22 MED ORDER — TECHNETIUM TC 99M TETROFOSMIN IV KIT
10.0000 | PACK | Freq: Once | INTRAVENOUS | Status: AC | PRN
  Administered 2018-12-22: 10 via INTRAVENOUS

## 2018-12-22 MED ORDER — MOMETASONE FURO-FORMOTEROL FUM 100-5 MCG/ACT IN AERO
2.0000 | INHALATION_SPRAY | Freq: Two times a day (BID) | RESPIRATORY_TRACT | Status: DC
  Filled 2018-12-22: qty 8.8

## 2018-12-22 MED ORDER — REGADENOSON 0.4 MG/5ML IV SOLN
0.4000 mg | Freq: Once | INTRAVENOUS | Status: AC
  Administered 2018-12-22: 0.4 mg via INTRAVENOUS
  Filled 2018-12-22: qty 5

## 2018-12-22 MED ORDER — REGADENOSON 0.4 MG/5ML IV SOLN
INTRAVENOUS | Status: AC
  Administered 2018-12-22: 14:00:00 0.4 mg via INTRAVENOUS
  Filled 2018-12-22: qty 5

## 2018-12-22 MED ORDER — INSULIN NPH (HUMAN) (ISOPHANE) 100 UNIT/ML ~~LOC~~ SUSP
50.0000 [IU] | SUBCUTANEOUS | Status: DC
  Administered 2018-12-22: 50 [IU] via SUBCUTANEOUS
  Filled 2018-12-22: qty 10

## 2018-12-22 MED ORDER — ONDANSETRON HCL 4 MG PO TABS: 4.0000 mg | ORAL_TABLET | Freq: Four times a day (QID) | ORAL | Status: DC | PRN

## 2018-12-22 MED ORDER — INSULIN ASPART 100 UNIT/ML ~~LOC~~ SOLN
10.0000 [IU] | Freq: Once | SUBCUTANEOUS | Status: AC
  Administered 2018-12-22: 10 [IU] via SUBCUTANEOUS

## 2018-12-22 MED ORDER — ATENOLOL 25 MG PO TABS
12.5000 mg | ORAL_TABLET | Freq: Two times a day (BID) | ORAL | Status: DC
  Filled 2018-12-22 (×2): qty 0.5

## 2018-12-22 MED ORDER — ASPIRIN 81 MG PO CHEW
81.0000 mg | CHEWABLE_TABLET | Freq: Every day | ORAL | Status: DC
  Administered 2018-12-22: 81 mg via ORAL
  Filled 2018-12-22: qty 1

## 2018-12-22 MED ORDER — ONDANSETRON HCL 4 MG/2ML IJ SOLN: 4.0000 mg | Freq: Four times a day (QID) | INTRAMUSCULAR | Status: DC | PRN

## 2018-12-22 MED ORDER — ALBUTEROL SULFATE (2.5 MG/3ML) 0.083% IN NEBU: 3.0000 mL | INHALATION_SOLUTION | Freq: Four times a day (QID) | RESPIRATORY_TRACT | Status: DC | PRN

## 2018-12-22 MED ORDER — NITROGLYCERIN 0.4 MG SL SUBL
0.4000 mg | SUBLINGUAL_TABLET | SUBLINGUAL | Status: DC | PRN
  Administered 2018-12-22: 01:00:00 0.4 mg via SUBLINGUAL
  Filled 2018-12-22: qty 1

## 2018-12-22 MED ORDER — PREDNISONE 20 MG PO TABS: 20.0000 mg | ORAL_TABLET | Freq: Every day | ORAL | 0 refills | Status: AC

## 2018-12-22 MED ORDER — ATENOLOL 25 MG PO TABS: 25.0000 mg | ORAL_TABLET | Freq: Every day | ORAL | Status: DC

## 2018-12-22 MED ORDER — PANTOPRAZOLE SODIUM 40 MG PO TBEC
40.0000 mg | DELAYED_RELEASE_TABLET | Freq: Every day | ORAL | Status: DC
  Administered 2018-12-22: 40 mg via ORAL
  Filled 2018-12-22: qty 1

## 2018-12-22 MED ORDER — PREDNISONE 20 MG PO TABS
20.0000 mg | ORAL_TABLET | Freq: Every day | ORAL | Status: DC
  Administered 2018-12-22: 20 mg via ORAL
  Filled 2018-12-22: qty 1

## 2018-12-22 MED ORDER — INSULIN ASPART 100 UNIT/ML ~~LOC~~ SOLN
0.0000 [IU] | SUBCUTANEOUS | Status: DC
  Administered 2018-12-22: 1 [IU] via SUBCUTANEOUS
  Administered 2018-12-22: 2 [IU] via SUBCUTANEOUS

## 2018-12-22 MED ORDER — ACETAMINOPHEN 325 MG PO TABS: 650.0000 mg | ORAL_TABLET | Freq: Four times a day (QID) | ORAL | Status: DC | PRN

## 2018-12-22 MED ORDER — ENOXAPARIN SODIUM 40 MG/0.4ML ~~LOC~~ SOLN
40.0000 mg | SUBCUTANEOUS | Status: DC
  Administered 2018-12-22: 40 mg via SUBCUTANEOUS
  Filled 2018-12-22: qty 0.4

## 2018-12-22 MED ORDER — FUROSEMIDE 40 MG PO TABS
40.0000 mg | ORAL_TABLET | Freq: Every day | ORAL | Status: DC
  Administered 2018-12-22: 40 mg via ORAL
  Filled 2018-12-22: qty 1

## 2018-12-22 MED ORDER — ACETAMINOPHEN 650 MG RE SUPP: 650.0000 mg | Freq: Four times a day (QID) | RECTAL | Status: DC | PRN

## 2018-12-22 NOTE — Telephone Encounter (Signed)
Daughter called on behalf of the patient. The patient was admitted to the hospital last night, and she is having a hard time understanding the language barrier with the Cardiac doctor currently assigned to her in the hospital. The patient was hoping Dr. Gwenlyn Found or another Cardiologist could come in and discuss the treatment plans with the patient.  She normally would have her daughter there, but due to [place] no one is allowed to be in the room with the patient.

## 2018-12-22 NOTE — Consult Note (Addendum)
Cardiology Consultation:   Patient ID: [name] [name] MRN: [id number]; DOB: February 07, 1946  Admit date: 12/21/2018 Date of Consult: 12/22/2018  Primary Care Provider: Jinny Sanders, MD Primary Cardiologist: Quay Burow, MD  Primary Electrophysiologist:  None    Patient Profile:   [name] [name] is a 73 y.o. female with a hx of contrast dye allergy, hyperlipidemia, OSA on CPAP, DM II, autoimmune hepatitis and history of SVT who is being seen today for the evaluation of chest pain at the request of Dr. Wyonia Hough.  History of Present Illness:   [name] [name] is a pleasant 73 year old female with past medical history of contrast dye allergy, hyperlipidemia, OSA on CPAP, DM II, autoimmune hepatitis and history of SVT.  Patient was remotely seen by Dr. Acie Fredrickson in 2013.  Echocardiogram obtained on 12/03/2011 showed EF 60 to 65%, grade 2 DD, peak PA pressure 31 mmHg.  TSH was normal.  She had a event monitor in June 2013 that showed sinus rhythm with PACs and PVCs.  She was followed up on an as-needed basis.  Due to intolerance to metoprolol, his PCP later switched her to verapamil.  She was switched back to the metoprolol later as her liver enzyme was elevated on the verapamil.  I saw the patient for the first time in June 2018 for evaluation of palpitation.  She complained of significant memory issues on the metoprolol.  I discussed the case with our clinical pharmacist, both verapamil and diltiazem were primarily metabolized in the liver, therefore we switched her to atenolol due to its least likelihood of cross the blood and brain barrier.  Echocardiogram obtained on 01/17/2017 showed EF 55 to 24%, normal diastolic function, no significant valve issue.  Patient was recently started prednisone due to autoimmune hepatitis.   She was cooking last night when she started having lower sternal chest pain radiating up to both chest and also the jaws.  The symptom improved after she took some aspirin.   She then walked to her daughter's house, during the walk, the chest pain came back.  Her daughter gave her some Mylanta and subsequently called 911.  She denies any exacerbating factors such as deep inspiration, body rotation or palpation.  This symptom is different from her previous palpitation.  She was admitted by hospitalist service.  Serial troponin overnight was negative.  EKG showed no significant ST-T wave changes.   Past Medical History:  Diagnosis Date  . Abnormal abdominal CT scan 04/2006   slight abn liver  . Abnormal chest CT 10/07   unable to bx  . Abnormal CT scan, pancreas or bile duct 04/2006   cyst  . Acute cystitis   . Acute pharyngitis   . Allergic rhinitis, cause unspecified   . Arthritis    hands, feet and back  . Benign neoplasm of colon   . Complication of anesthesia    " i WAKE UP WITH TREMORS "  . Cyst and pseudocyst of pancreas   . Depressive disorder, not elsewhere classified   . Diverticulitis   . Diverticulosis of colon (without mention of hemorrhage)   . Dysthymic disorder   . Dysuria   . Edema   . Esophageal reflux   . Essential and other specified forms of tremor   . Irritable bowel syndrome   . Obesity, unspecified   . Other diseases of lung, not elsewhere classified   . Other specified cardiac dysrhythmias(427.89)   . Pain in joint, lower leg   . Periapical abscess without  sinus   . Polyneuropathy in diabetes(357.2)   . Pure hypercholesterolemia   . Sleep apnea    CPAP- non compliant  . Type II or unspecified type diabetes mellitus with neurological manifestations, not stated as uncontrolled(250.60)   . Type II or unspecified type diabetes mellitus without mention of complication, not stated as uncontrolled   . Unspecified disorder of liver   . Unspecified sleep apnea   . Unspecified urinary incontinence     Past Surgical History:  Procedure Laterality Date  . ABDOMINAL HYSTERECTOMY    . CALCANEAL OSTEOTOMY Right 07/27/2015    Procedure: RIGHT CALCANEAL OSTEOTOMY ;  Surgeon: Wylene Simmer, MD;  Location: [facility];  Service: Orthopedics;  Laterality: Right;  . CHOLECYSTECTOMY     15-20 years ago  . GASTROCNEMIUS RECESSION Right 07/27/2015   Procedure: RIGHT GASTROC RECESSION;  Surgeon: Wylene Simmer, MD;  Location: [facility];  Service: Orthopedics;  Laterality: Right;  . KIDNEY STONE SURGERY  1980's   removal  . 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 PARTIAL HYSTERECTOMY  1990's  . ROTATOR CUFF REPAIR  2005     Home Medications:  Prior to Admission medications   Medication Sig Start Date End Date Taking? Authorizing Provider  albuterol (PROAIR HFA) 108 (90 Base) MCG/ACT inhaler Inhale 2 puffs into the lungs every 6 (six) hours as needed for wheezing or shortness of breath. 12/14/17  Yes Nanavati, Ankit, MD  aspirin 81 MG tablet Take 81 mg by mouth daily.   Yes [provider]  atenolol (TENORMIN) 25 MG tablet TAKE 1 TABLET BY MOUTH EVERY DAY Patient taking differently: Take 25 mg by mouth at bedtime.  01/05/18  Yes Lorretta Harp, MD  fluticasone (FLONASE) 50 MCG/ACT nasal spray 1-2 puffs each nostril, once or twice daily for allergy Patient taking differently: Place 1 spray into both nostrils daily.  01/29/13  Yes Young, Tarri Fuller D, MD  furosemide (LASIX) 40 MG tablet Take 40 mg by mouth daily.   Yes [provider]  NEXIUM 40 MG capsule TAKE 1 CAPSULE BY MOUTH DAILY Patient taking differently: Take 40 mg by mouth daily.  10/28/12  Yes Bedsole, Amy E, MD  NOVOLIN N RELION 100 UNIT/ML injection Inject 50 Units into the skin 3 (three) times daily. 11/20/18  Yes [provider]  NOVOLIN R RELION 100 UNIT/ML injection Inject 10-50 Units into the skin 3 (three) times daily. Sliding scale 11/20/18  Yes [provider]  Insulin Pen Needle (B-D ULTRAFINE III SHORT PEN) 31G X 8 MM MISC Use to inject insulin two times a day.  Dx: E11.9 12/01/14   Jinny Sanders, MD   Respiratory Therapy Supplies (FLUTTER) DEVI Twice a day or as needed 12/22/17   Lauraine Rinne, NP    Inpatient Medications: Scheduled Meds: . aspirin  81 mg Oral Daily  . atenolol  12.5 mg Oral BID  . enoxaparin (LOVENOX) injection  40 mg Subcutaneous Q24H  . furosemide  40 mg Oral Daily  . insulin aspart  0-9 Units Subcutaneous Q4H  . insulin NPH Human  50 Units Subcutaneous 2 times per day  . mometasone-formoterol  2 puff Inhalation BID  . pantoprazole  40 mg Oral Daily  . predniSONE  20 mg Oral Q breakfast   Continuous Infusions:  PRN Meds: acetaminophen **OR** acetaminophen, albuterol, nitroGLYCERIN, ondansetron **OR** ondansetron (ZOFRAN) IV  Allergies:    Allergies  Allergen Reactions  . Doxycycline Hives  . Epinephrine  Hcl Other (See Comments)    REACTION: Had tremors when it was given.  . Erythromycin Ethylsuccinate Other (See Comments)    REACTION: abd spasms  . Iohexol Other (See Comments)     Code: HIVES, Desc: pt needs premedicated-- hives on prev contrast study per md office   . Oxycodone Other (See Comments)    "felt like I was dying"    Social History:   Social History   Socioeconomic History  . Marital status: Widowed    Spouse name: Not on file  . Number of children: 5  . Years of education: Not on file  . Highest education level: Not on file  Occupational History    Employer: RETIRED  Social Needs  . Financial resource strain: Not on file  . Food insecurity:    Worry: Not on file    Inability: Not on file  . Transportation needs:    Medical: Not on file    Non-medical: Not on file  Tobacco Use  . Smoking status: Never Smoker  . Smokeless tobacco: Never Used  Substance and Sexual Activity  . Alcohol use: No    Alcohol/week: 0.0 standard drinks  . Drug use: No  . Sexual activity: Not on file  Lifestyle  . Physical activity:    Days per week: Not on file    Minutes per session: Not on file  . Stress: Not on file  Relationships  .  Social connections:    Talks on phone: Not on file    Gets together: Not on file    Attends religious service: Not on file    Active member of club or organization: Not on file    Attends meetings of clubs or organizations: Not on file    Relationship status: Not on file  . Intimate partner violence:    Fear of current or ex partner: Not on file    Emotionally abused: Not on file    Physically abused: Not on file    Forced sexual activity: Not on file  Other Topics Concern  . Not on file  Social History Narrative   Husband treated for testicular cancer, 2 cups coffee a day, no exercise    Family History:    Family History  Problem Relation Age of Onset  . Clotting disorder Mother   . Cirrhosis Father   . Diabetes Other        2 aunts  . Cancer Other        grandmother  . Allergies Other        "whole family"  . Asthma Other        "whole family"     ROS:  Please see the history of present illness.   All other ROS reviewed and negative.     Physical Exam/Data:   Vitals:   12/22/18 0300 12/22/18 0847 12/22/18 0855 12/22/18 0856  BP: (!) 147/63  (!) 152/72   Pulse: (!) 56 95 (!) 58 (!) 58  Resp: 16 18 18    Temp: 97.8 F (36.6 C)  98 F (36.7 C)   TempSrc: Oral  Oral   SpO2: 98% 95% 96%   Weight: 99.4 kg     Height:        Intake/Output Summary (Last 24 hours) at 12/22/2018 1155 Last data filed at 12/22/2018 0510 Gross per 24 hour  Intake 240 ml  Output 300 ml  Net -60 ml   Last 3 Weights 12/22/2018 12/21/2018 05/14/2018  Weight (lbs) 219  lb 2.2 oz 221 lb 220 lb  Weight (kg) 99.4 kg 100.245 kg 99.791 kg     Body mass index is 37.61 kg/m.  General:  Well nourished, well developed, in no acute distress HEENT: normal Lymph: no adenopathy Neck: no JVD Endocrine:  No thryomegaly Vascular: No carotid bruits; FA pulses 2+ bilaterally without bruits  Cardiac:  normal S1, S2; RRR; no murmur  Lungs:  clear to auscultation bilaterally, no wheezing, rhonchi or rales   Abd: soft, nontender, no hepatomegaly  Ext: no edema Musculoskeletal:  No deformities, BUE and BLE strength normal and equal Skin: warm and dry  Neuro:  CNs 2-12 intact, no focal abnormalities noted Psych:  Normal affect   EKG:  The EKG was personally reviewed and demonstrates: Normal sinus rhythm without significant ST-T wave changes Telemetry:  Telemetry was personally reviewed and demonstrates: Sinus bradycardia  Relevant CV Studies:  Echo 01/17/2017 LV EF: 55% -   60%  ------------------------------------------------------------------- Indications:      (R06.02).  ------------------------------------------------------------------- History:   PMH:  SVT. Acquired from the patient and from the patient&'s chart.  Dyspnea.  Risk factors:  Diabetes mellitus. Obese. Dyslipidemia.  ------------------------------------------------------------------- Study Conclusions  - Left ventricle: The cavity size was normal. There was mild focal   basal hypertrophy of the septum. Systolic function was normal.   The estimated ejection fraction was in the range of 55% to 60%.   Wall motion was normal; there were no regional wall motion   abnormalities. Left ventricular diastolic function parameters   were normal. - Mitral valve: Calcified annulus.  Impressions:  - Normal LV function; trace MR and TR.  Laboratory Data:  Chemistry Recent Labs  Lab 12/21/18 2152 12/22/18 0248 12/22/18 0842  NA 139  --  141  K 3.8  --  4.1  CL 103  --  103  CO2 24  --  26  GLUCOSE 197*  --  139*  BUN 29*  --  25*  CREATININE 1.19* 1.16* 1.03*  CALCIUM 10.6*  --  9.5  GFRNONAA 46* 47* 54*  GFRAA 53* 54* >60  ANIONGAP 12  --  12    Recent Labs  Lab 12/21/18 2152  PROT 6.5  ALBUMIN 3.3*  AST 49*  ALT 72*  ALKPHOS 139*  BILITOT 0.9   Hematology Recent Labs  Lab 12/21/18 2152 12/22/18 0248 12/22/18 0842  WBC 8.7 9.1 7.1  RBC 4.54 4.83 4.51  HGB 13.9 14.8 13.7  HCT 42.2 44.8 41.5   MCV 93.0 92.8 92.0  MCH 30.6 30.6 30.4  MCHC 32.9 33.0 33.0  RDW 13.9 13.9 13.9  PLT 139* 153 146*   Cardiac Enzymes Recent Labs  Lab 12/21/18 2152 12/22/18 0248 12/22/18 0842  TROPONINI <0.03 <0.03 <0.03   No results for input(s): TROPIPOC in the last 168 hours.  BNPNo results for input(s): BNP, PROBNP in the last 168 hours.  DDimer No results for input(s): DDIMER in the last 168 hours.  Radiology/Studies:  Dg Chest Portable 1 View  Result Date: 12/21/2018 CLINICAL DATA:  Chest pain EXAM: PORTABLE CHEST 1 VIEW COMPARISON:  12/14/2017 FINDINGS: The heart size and mediastinal contours are within normal limits. Both lungs are clear. The visualized skeletal structures are unremarkable. There is a stable appearance of the distal right clavicle. IMPRESSION: No active disease. Electronically Signed   By: Constance Holster M.D.   On: 12/21/2018 22:25    Assessment and Plan:   1. Chest pain: Chest pain occurred at rest yesterday, however  also occurred later while she was trying to walk to her daughter's house.  Her cardiac risk factors include age and hyperlipidemia.  We discussed both stress test versus cardiac catheterization.  I will discuss treatment plan with MD, patient is hesitant to proceed with invasive study due to history of contrast allergy and intolerance to "numbing medication "  2. Hyperlipidemia: Not on statin due to chronically elevated transaminase  3. DM2: On insulin at home  4. History of SVT: Controlled on atenolol  5. Autoimmune hepatitis: Started on prednisone recently.      For questions or updates, please contact [place] Please consult [email or web address] for contact info under     Signed, Almyra Deforest, [place]  12/22/2018 11:55 AM   Attending Note:   The patient was seen and examined.  Agree with assessment and plan as noted above.  Changes made to the above note as needed.  Patient seen and independently examined with  Almyra Deforest, PA .   We discussed all  aspects of the encounter. I agree with the assessment and plan as stated above.  1.  Chest pain :   Symptoms are somewhat atypical..  They started while she was cooking in the kitchen.  The pain lasted for several hours.  Troponin levels have been negative.  At this point I do not think that she needs to be referred for heart catheterization.  We will get a [place] study for further evaluation.    I have spent a total of 40 minutes with patient reviewing hospital  notes , telemetry, EKGs, labs and examining patient as well as establishing an assessment and plan that was discussed with the patient. > 50% of time was spent in direct patient care.    Thayer Headings, Brooke Bonito., MD, [facility] 12/22/2018, 3:30 PM [address],  [place] Pager [phone]

## 2018-12-22 NOTE — H&P (Signed)
History and Physical    Alonda Weaber YQM:[id number] DOB: March 08, 1946 DOA: 12/21/2018  PCP: Jinny Sanders, MD  Patient coming from:  Home.  Chief Complaint: Chest pain.  HPI: [name] [name] is a 73 y.o. 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  Patient chest pain started last evening when patient was cooking.  Chest pain was retrosternal and radiating across the chest jaw and up to the temples.  Denies any nausea vomiting has had some mild shortness of breath.  EMS was called and patient was brought to the ER.  ED Course: In the ER patient was given sublingual nitroglycerin following which chest pain improved but did not completely resolved.  Chest pain again happened when patient was walking to the bathroom.  Chest x-ray was unremarkable EKG was showing normal sinus rhythm.  Troponin was negative.  Patient admitted for further work-up of chest pain.  Review of Systems: As per HPI, rest all negative.   Past Medical History:  Diagnosis Date  . Abnormal abdominal CT scan 04/2006   slight abn liver  . Abnormal chest CT 10/07   unable to bx  . Abnormal CT scan, pancreas or bile duct 04/2006   cyst  . Acute cystitis   . Acute pharyngitis   . Allergic rhinitis, cause unspecified   . Arthritis    hands, feet and back  . Benign neoplasm of colon   . Cyst and pseudocyst of pancreas   . Depressive disorder, not elsewhere classified   . Diverticulitis   . Diverticulosis of colon (without mention of hemorrhage)   . Dysthymic disorder   . Dysuria   . Edema   . Esophageal reflux   . Essential and other specified forms of tremor   . Irritable bowel syndrome   . Obesity, unspecified   . Other diseases of lung, not elsewhere classified   . Other specified cardiac dysrhythmias(427.89)   . Pain in joint, lower leg   .  Periapical abscess without sinus   . Polyneuropathy in diabetes(357.2)   . Pure hypercholesterolemia   . Sleep apnea    CPAP- non compliant  . Type II or unspecified type diabetes mellitus with neurological manifestations, not stated as uncontrolled(250.60)   . Type II or unspecified type diabetes mellitus without mention of complication, not stated as uncontrolled   . Unspecified disorder of liver   . Unspecified sleep apnea   . Unspecified urinary incontinence     Past Surgical History:  Procedure Laterality Date  . ABDOMINAL HYSTERECTOMY    . CALCANEAL OSTEOTOMY Right 07/27/2015   Procedure: RIGHT CALCANEAL OSTEOTOMY ;  Surgeon: Wylene Simmer, MD;  Location: [facility];  Service: Orthopedics;  Laterality: Right;  . CHOLECYSTECTOMY     15-20 years ago  . GASTROCNEMIUS RECESSION Right 07/27/2015   Procedure: RIGHT GASTROC RECESSION;  Surgeon: Wylene Simmer, MD;  Location: [facility];  Service: Orthopedics;  Laterality: Right;  . KIDNEY STONE SURGERY  1980's   removal  . 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 PARTIAL HYSTERECTOMY  1990's  . ROTATOR CUFF REPAIR  2005     reports that she has never smoked. She has never used smokeless tobacco. She reports that she does not drink alcohol or use drugs.  Allergies  Allergen Reactions  . Doxycycline Hives  . Epinephrine Hcl  Other (See Comments)    REACTION: Had tremors when it was given.  . Erythromycin Ethylsuccinate Other (See Comments)    REACTION: abd spasms  . Iohexol Other (See Comments)     Code: HIVES, Desc: pt needs premedicated-- hives on prev contrast study per md office   . Oxycodone Other (See Comments)    "felt like I was dying"    Family History  Problem Relation Age of Onset  . Clotting disorder Mother   . Cirrhosis Father   . Diabetes Other        2 aunts  . Cancer Other        grandmother  . Allergies Other        "whole family"  . Asthma Other        "whole family"    Prior  to Admission medications   Medication Sig Start Date End Date Taking? Authorizing Provider  albuterol (PROAIR HFA) 108 (90 Base) MCG/ACT inhaler Inhale 2 puffs into the lungs every 6 (six) hours as needed for wheezing or shortness of breath. 12/14/17   Varney Biles, MD  amoxicillin-clavulanate (AUGMENTIN) 875-125 MG tablet Take 1 tablet by mouth 2 (two) times daily. 05/14/18   Jearld Fenton, NP  aspirin 81 MG tablet Take 81 mg by mouth daily.    [provider]  atenolol (TENORMIN) 25 MG tablet TAKE 1 TABLET BY MOUTH EVERY DAY 01/05/18   Lorretta Harp, MD  budesonide-formoterol [facility]) 80-4.5 MCG/ACT inhaler Inhale 2 puffs into the lungs 2 (two) times daily. 12/26/17   Deneise Lever, MD  fluticasone (FLONASE) 50 MCG/ACT nasal spray 1-2 puffs each nostril, once or twice daily for allergy 01/29/13   Baird Lyons D, MD  furosemide (LASIX) 40 MG tablet Take 40 mg by mouth daily.    [provider]  Insulin Pen Needle (B-D ULTRAFINE III SHORT PEN) 31G X 8 MM MISC Use to inject insulin two times a day.  Dx: E11.9 12/01/14   Bedsole, Amy E, MD  NEXIUM 40 MG capsule TAKE 1 CAPSULE BY MOUTH DAILY 10/28/12   Bedsole, Amy E, MD  NOVOLIN 70/30 RELION (70-30) 100 UNIT/ML injection Inject 30-50 Units into the skin 3 (three) times daily. 50 units in the morning, 30 units at lunch and 50 units at dinner 12/23/12   [provider]  pseudoephedrine (SUDAFED) 30 MG tablet Take 30 mg by mouth every 8 (eight) hours as needed for congestion.    [provider]  Respiratory Therapy Supplies (FLUTTER) DEVI Twice a day or as needed 12/22/17   Lauraine Rinne, NP    Physical Exam: Vitals:   12/21/18 2131 12/21/18 2309  BP: (!) 149/64   Pulse: (!) 58   Resp: 12   Temp: 98.4 F (36.9 C)   TempSrc: Oral   SpO2: 95%   Weight:  100.2 kg  Height:  5' 4"  (1.626 m)      Constitutional: Moderately built and nourished. Vitals:   12/21/18 2131 12/21/18 2309  BP: (!) 149/64    Pulse: (!) 58   Resp: 12   Temp: 98.4 F (36.9 C)   TempSrc: Oral   SpO2: 95%   Weight:  100.2 kg  Height:  5' 4"  (1.626 m)   Eyes: Anicteric no pallor. ENMT: No discharge from the ears eyes nose or mouth. Neck: No mass or.  No neck rigidity no JVD appreciated. Respiratory: No rhonchi or crepitations. Cardiovascular: S1-S2 heard. Abdomen: Soft nontender bowel sounds present. Musculoskeletal:  No edema.  No joint effusion. Skin: No rash. Neurologic: Alert awake oriented to time place and person.  Moves all extremities. Psychiatric: Appears normal per normal affect.   Labs on Admission: I have personally reviewed following labs and imaging studies  CBC: Recent Labs  Lab 12/21/18 2152  WBC 8.7  NEUTROABS 5.4  HGB 13.9  HCT 42.2  MCV 93.0  PLT 882*   Basic Metabolic Panel: Recent Labs  Lab 12/21/18 2152  NA 139  K 3.8  CL 103  CO2 24  GLUCOSE 197*  BUN 29*  CREATININE 1.19*  CALCIUM 10.6*   GFR: Estimated Creatinine Clearance: 49.2 mL/min (A) (by C-G formula based on SCr of 1.19 mg/dL (H)). Liver Function Tests: Recent Labs  Lab 12/21/18 2152  AST 49*  ALT 72*  ALKPHOS 139*  BILITOT 0.9  PROT 6.5  ALBUMIN 3.3*   Recent Labs  Lab 12/21/18 2152  LIPASE 52*   No results for input(s): AMMONIA in the last 168 hours. Coagulation Profile: No results for input(s): INR, PROTIME in the last 168 hours. Cardiac Enzymes: Recent Labs  Lab 12/21/18 2152  TROPONINI <0.03   BNP (last 3 results) No results for input(s): PROBNP in the last 8760 hours. HbA1C: No results for input(s): HGBA1C in the last 72 hours. CBG: No results for input(s): GLUCAP in the last 168 hours. Lipid Profile: No results for input(s): CHOL, HDL, LDLCALC, TRIG, CHOLHDL, LDLDIRECT in the last 72 hours. Thyroid Function Tests: No results for input(s): TSH, T4TOTAL, FREET4, T3FREE, THYROIDAB in the last 72 hours. Anemia Panel: No results for input(s): VITAMINB12, FOLATE, FERRITIN,  TIBC, IRON, RETICCTPCT in the last 72 hours. Urine analysis:    Component Value Date/Time   COLORURINE yellow 03/28/2010 0931   APPEARANCEUR Hazy 03/28/2010 0931   LABSPEC 1.010 03/28/2010 0931   PHURINE 6.0 03/28/2010 0931   HGBUR moderate 03/28/2010 0931   BILIRUBINUR neg 01/06/2014 1230   PROTEINUR neg 01/06/2014 1230   UROBILINOGEN negative 01/06/2014 1230   UROBILINOGEN 0.2 03/28/2010 0931   NITRITE neg 01/06/2014 1230   NITRITE positive 03/28/2010 0931   LEUKOCYTESUR Negative 01/06/2014 1230   Sepsis Labs: @LABRCNTIP (procalcitonin:4,lacticidven:4) )No results found for this or any previous visit (from the past 240 hour(s)).   Radiological Exams on Admission: Dg Chest Portable 1 View  Result Date: 12/21/2018 CLINICAL DATA:  Chest pain EXAM: PORTABLE CHEST 1 VIEW COMPARISON:  12/14/2017 FINDINGS: The heart size and mediastinal contours are within normal limits. Both lungs are clear. The visualized skeletal structures are unremarkable. There is a stable appearance of the distal right clavicle. IMPRESSION: No active disease. Electronically Signed   By: Constance Holster M.D.   On: 12/21/2018 22:25    EKG: Independently reviewed.  Normal sinus rhythm.  Assessment/Plan Principal Problem:   Chest pain Active Problems:   Poorly controlled type 2 diabetes mellitus (HCC)   HYPERCHOLESTEROLEMIA   GERD   Obstructive sleep apnea    1. Chest pain -has atypical and typical features but chest pain did improve with sublingual nitroglycerin.  We will cycle cardiac markers and keep patient on aspirin PRN nitroglycerin patient is already on atenolol.  Cardiology has been notified.  Check d-dimer. 2. History of autoimmune hepatitis recently started on prednisone presently on prednisone 20 mg p.o. daily. 3. Diabetes mellitus type 2 on insulin.  Insulin dose was discussed with pharmacy will be dosing substitute insulin for which patient takes at home.  Closely follow CBGs. 4.  Thrombocytopenia likely from liver disease follow  CBC closely. 5. On atenolol for palpitations and PVCs. 6. History of sleep apnea.   DVT prophylaxis: Lovenox. Code Status: Full code. Family Communication: Discussed with patient. Disposition Plan: Home. Consults called: Cardiology. Admission status: Observation.   [name] MD [facility] Pager [phone].  If 7PM-7AM, please contact night-coverage [email or web address] Password [facility]  12/22/2018, 12:41 AM

## 2018-12-22 NOTE — Telephone Encounter (Signed)
LMTCB  At time of call, cardiology had not seen patient in the hospital. Per note, "Cardiology has been notified" - unclear if consult was ordered

## 2018-12-22 NOTE — Plan of Care (Signed)
  Problem: Education: Goal: Knowledge of General Education information will improve Description Including pain rating scale, medication(s)/side effects and non-pharmacologic comfort measures Outcome: Progressing   

## 2018-12-22 NOTE — Progress Notes (Signed)
1 day lexiscan stress test completed, pending final report by [facility] Radiology this afternoon

## 2018-12-22 NOTE — Discharge Summary (Signed)
Physician Discharge Summary  [name] [name] FBP:[id number] DOB: [birth year] DOA: 12/21/2018  PCP: Jinny Sanders, MD  Admit date: 12/21/2018 Discharge date: 12/22/2018  Admitted From: Observation Disposition: home  Recommendations for Outpatient Follow-up:  1. Follow up with PCP in 1-2 weeks 2. Please obtain BMP/CBC in one week 3. Please follow up on the following pending results:  Home Health:No Equipment/Devices:none  Discharge Condition:Stable CODE STATUS:Full code Diet recommendation: Diabetic diet  Brief/Interim Summary: [name] [name] is a 73 y.o. 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  Patient chest pain started last evening when patient was cooking.  Chest pain was retrosternal and radiating across the chest jaw and up to the temples.  Denies any nausea vomiting has had some mild shortness of breath.  EMS was called and patient was brought to the ER.  Hospital course: In the ER patient was seen by hospitalist service and admitted for further evaluation of atypical chest pain.  She was kept on aspirin as needed nitro and was continued on atenolol.  Patient was seen by cardiology in consultation with recommendations for stress test.  Cardiac enzymes were negative initial portion of stress test is negative nuclear portion is pending if negative anticipate stable for discharge home.  Autoimmune hepatits: pt was recently started on prednisone 20 mg po dailt by her report.  She will be continued on this as an outpt and follow with her primary prescriber for f/up  DM: Pt to continue herr home meds on d/c with her PCP to continue to monitor hgba1c and lipid panel  Discharge Diagnoses:  Principal Problem:   Chest pain Active Problems:   Poorly controlled type 2 diabetes mellitus ([place])   HYPERCHOLESTEROLEMIA   GERD    Obstructive sleep apnea    Discharge Instructions  Discharge Instructions    Call MD for:   Complete by:  As directed    For any acute change in medical condition   Diet Carb Modified   Complete by:  As directed    Increase activity slowly   Complete by:  As directed      Allergies as of 12/22/2018      Reactions   Doxycycline Hives   Epinephrine Hcl Other (See Comments)   REACTION: Had tremors when it was given.   Erythromycin Ethylsuccinate Other (See Comments)   REACTION: abd spasms   Iohexol Other (See Comments)    Code: HIVES, Desc: pt needs premedicated-- hives on prev contrast study per md office   Oxycodone Other (See Comments)   "felt like I was dying"      Medication List    TAKE these medications   albuterol 108 (90 Base) MCG/ACT inhaler Commonly known as:  ProAir HFA Inhale 2 puffs into the lungs every 6 (six) hours as needed for wheezing or shortness of breath.   aspirin 81 MG tablet Take 81 mg by mouth daily.   atenolol 25 MG tablet Commonly known as:  TENORMIN TAKE 1 TABLET BY MOUTH EVERY DAY What changed:  when to take this   fluticasone 50 MCG/ACT nasal spray Commonly known as:  FLONASE 1-2 puffs each nostril, once or twice daily for allergy What changed:    how much to take  how to take this  when to take this  additional instructions   Flutter Devi Twice a day or as needed  furosemide 40 MG tablet Commonly known as:  LASIX Take 40 mg by mouth daily.   Insulin Pen Needle 31G X 8 MM Misc Commonly known as:  B-D ULTRAFINE III SHORT PEN Use to inject insulin two times a day.  Dx: E11.9   NexIUM 40 MG capsule Generic drug:  esomeprazole TAKE 1 CAPSULE BY MOUTH DAILY What changed:  how much to take   NovoLIN N ReliOn 100 UNIT/ML injection Generic drug:  insulin NPH Human Inject 50 Units into the skin 3 (three) times daily.   NovoLIN R ReliOn 100 units/mL injection Generic drug:  insulin regular Inject 10-50 Units into the skin  3 (three) times daily. Sliding scale   predniSONE 20 MG tablet Commonly known as:  DELTASONE Take 1 tablet (20 mg total) by mouth daily with breakfast for 30 days. Start taking on:  December 23, 2018       Allergies  Allergen Reactions  . Doxycycline Hives  . Epinephrine Hcl Other (See Comments)    REACTION: Had tremors when it was given.  . Erythromycin Ethylsuccinate Other (See Comments)    REACTION: abd spasms  . Iohexol Other (See Comments)     Code: HIVES, Desc: pt needs premedicated-- hives on prev contrast study per md office   . Oxycodone Other (See Comments)    "felt like I was dying"    Consultations:  cardiology   Procedures/Studies: Nm Myocar Multi W/spect W/wall Motion / Ef  Result Date: 12/22/2018 CLINICAL DATA:  Chest pain EXAM: MYOCARDIAL IMAGING WITH SPECT (REST AND PHARMACOLOGIC-STRESS) GATED LEFT VENTRICULAR WALL MOTION STUDY LEFT VENTRICULAR EJECTION FRACTION TECHNIQUE: Standard myocardial SPECT imaging was performed after resting intravenous injection of 10 mCi Tc-48mtetrofosmin. Subsequently, intravenous infusion of Lexiscan was performed under the supervision of the Cardiology staff. At peak effect of the drug, 30 mCi Tc-968metrofosmin was injected intravenously and standard myocardial SPECT imaging was performed. Quantitative gated imaging was also performed to evaluate left ventricular wall motion, and estimate left ventricular ejection fraction. COMPARISON:  None. FINDINGS: Perfusion: Decreased activity is noted inferiorly on the rest images, improving on stress images most compatible with soft tissue attenuation. No reversible defects to suggest ischemia. Wall Motion: Normal left ventricular wall motion. No left ventricular dilation. Left Ventricular Ejection Fraction: 82 % End diastolic volume 67 ml End systolic volume 12 ml IMPRESSION: 1. No reversible ischemia or infarction. 2. Normal left ventricular wall motion. 3. Left ventricular ejection fraction 82% 4.  Non invasive risk stratification*: Low *2012 Appropriate Use Criteria for Coronary Revascularization Focused Update: J Am Coll Cardiol. 207169;67(8):938-101http://content.onairportbarriers.comspx?articleid=[email or web address] Electronically Signed   By: KeRolm Baptise.D.   On: 12/22/2018 15:58   Dg Chest Portable 1 View  Result Date: 12/21/2018 CLINICAL DATA:  Chest pain EXAM: PORTABLE CHEST 1 VIEW COMPARISON:  12/14/2017 FINDINGS: The heart size and mediastinal contours are within normal limits. Both lungs are clear. The visualized skeletal structures are unremarkable. There is a stable appearance of the distal right clavicle. IMPRESSION: No active disease. Electronically Signed   By: ChConstance Holster.D.   On: 12/21/2018 22:25   UsKoreabdomen Limited Ruq  Result Date: 11/23/2018 CLINICAL DATA:  Hepatic cirrhosis. EXAM: ULTRASOUND ABDOMEN LIMITED RIGHT UPPER QUADRANT COMPARISON:  CT scan of April 06, 2015. FINDINGS: Gallbladder: Status post cholecystectomy. Common bile duct: Diameter: 5 mm which is within normal limits. Liver: Heterogeneous echotexture of hepatic parenchyma is noted with slightly nodular contours suggesting hepatic cirrhosis. No definite focal sonographic abnormality is noted. Portal vein  is patent on color Doppler imaging with normal direction of blood flow towards the liver. IMPRESSION: Status post cholecystectomy. Findings concerning for possible hepatic cirrhosis. No definite focal hepatic abnormality is seen sonographically. Electronically Signed   By: Marijo Conception M.D.   On: 11/23/2018 16:35       Subjective: Resting comfortably in bed, no cp Eager to be discharged home  Discharge Exam: Vitals:   12/22/18 1404 12/22/18 1406  BP: (!) 153/72 (!) 150/69  Pulse:    Resp:    Temp:    SpO2:     Vitals:   12/22/18 1343 12/22/18 1402 12/22/18 1404 12/22/18 1406  BP: 105/66 (!) 160/74 (!) 153/72 (!) 150/69  Pulse:      Resp:      Temp:      TempSrc:      SpO2:       Weight:      Height:        General: Pt is alert, awake, not in acute distress Cardiovascular: RRR, S1/S2 +, no rubs, no gallops Respiratory: CTA bilaterally, no wheezing, no rhonchi Abdominal: Soft, NT, ND, bowel sounds + Extremities: no edema, no cyanosis    The results of significant diagnostics from this hospitalization (including imaging, microbiology, ancillary and laboratory) are listed below for reference.     Microbiology: Recent Results (from the past 240 hour(s))  SARS Coronavirus 2 (CEPHEID - Performed in [place] hospital lab), Hosp Order     Status: None   Collection Time: 12/22/18 12:17 AM  Result Value Ref Range Status   SARS Coronavirus 2 NEGATIVE NEGATIVE Final    Comment: (NOTE) If result is NEGATIVE SARS-CoV-2 target nucleic acids are NOT DETECTED. The SARS-CoV-2 RNA is generally detectable in upper and lower  respiratory specimens during the acute phase of infection. The lowest  concentration of SARS-CoV-2 viral copies this assay can detect is 250  copies / mL. A negative result does not preclude SARS-CoV-2 infection  and should not be used as the sole basis for treatment or other  patient management decisions.  A negative result may occur with  improper specimen collection / handling, submission of specimen other  than nasopharyngeal swab, presence of viral mutation(s) within the  areas targeted by this assay, and inadequate number of viral copies  (<250 copies / mL). A negative result must be combined with clinical  observations, patient history, and epidemiological information. If result is POSITIVE SARS-CoV-2 target nucleic acids are DETECTED. The SARS-CoV-2 RNA is generally detectable in upper and lower  respiratory specimens dur ing the acute phase of infection.  Positive  results are indicative of active infection with SARS-CoV-2.  Clinical  correlation with patient history and other diagnostic information is  necessary to determine patient  infection status.  Positive results do  not rule out bacterial infection or co-infection with other viruses. If result is PRESUMPTIVE POSTIVE SARS-CoV-2 nucleic acids MAY BE PRESENT.   A presumptive positive result was obtained on the submitted specimen  and confirmed on repeat testing.  While 2019 novel coronavirus  (SARS-CoV-2) nucleic acids may be present in the submitted sample  additional confirmatory testing may be necessary for epidemiological  and / or clinical management purposes  to differentiate between  SARS-CoV-2 and other Sarbecovirus currently known to infect humans.  If clinically indicated additional testing with an alternate test  methodology [phone]) is advised. The SARS-CoV-2 RNA is generally  detectable in upper and lower respiratory sp ecimens during the acute  phase  of infection. The expected result is Negative. Fact Sheet for Patients:  StrictlyIdeas.no Fact Sheet for Healthcare Providers: BankingDealers.co.za This test is not yet approved or cleared by the [place] FDA and has been authorized for detection and/or diagnosis of SARS-CoV-2 by FDA under an Emergency Use Authorization (EUA).  This EUA will remain in effect (meaning this test can be used) for the duration of the COVID-19 declaration under Section 564(b)(1) of the Act, 21 U.S.C. section 360bbb-3(b)(1), unless the authorization is terminated or revoked sooner. Performed at [facility], [place] [address] [place], [place] [address]      Labs: BNP (last 3 results) No results for input(s): BNP in the last 8760 hours. Basic Metabolic Panel: Recent Labs  Lab 12/21/18 2152 12/22/18 0248 12/22/18 0842  NA 139  --  141  K 3.8  --  4.1  CL 103  --  103  CO2 24  --  26  GLUCOSE 197*  --  139*  BUN 29*  --  25*  CREATININE 1.19* 1.16* 1.03*  CALCIUM 10.6*  --  9.5   Liver Function Tests: Recent Labs  Lab 12/21/18 2152  AST 49*  ALT 72*   ALKPHOS 139*  BILITOT 0.9  PROT 6.5  ALBUMIN 3.3*   Recent Labs  Lab 12/21/18 2152  LIPASE 52*   No results for input(s): AMMONIA in the last 168 hours. CBC: Recent Labs  Lab 12/21/18 2152 12/22/18 0248 12/22/18 0842  WBC 8.7 9.1 7.1  NEUTROABS 5.4  --   --   HGB 13.9 14.8 13.7  HCT 42.2 44.8 41.5  MCV 93.0 92.8 92.0  PLT 139* 153 146*   Cardiac Enzymes: Recent Labs  Lab 12/21/18 2152 12/22/18 0248 12/22/18 0842  TROPONINI <0.03 <0.03 <0.03   BNP: Invalid input(s): POCBNP CBG: Recent Labs  Lab 12/22/18 0305 12/22/18 0805 12/22/18 1213  GLUCAP 79 178* 147*   D-Dimer No results for input(s): DDIMER in the last 72 hours. Hgb A1c No results for input(s): HGBA1C in the last 72 hours. Lipid Profile No results for input(s): CHOL, HDL, LDLCALC, TRIG, CHOLHDL, LDLDIRECT in the last 72 hours. Thyroid function studies No results for input(s): TSH, T4TOTAL, T3FREE, THYROIDAB in the last 72 hours.  Invalid input(s): FREET3 Anemia work up No results for input(s): VITAMINB12, FOLATE, FERRITIN, TIBC, IRON, RETICCTPCT in the last 72 hours. Urinalysis    Component Value Date/Time   COLORURINE yellow 03/28/2010 0931   APPEARANCEUR Hazy 03/28/2010 0931   LABSPEC 1.010 03/28/2010 0931   PHURINE 6.0 03/28/2010 0931   HGBUR moderate 03/28/2010 0931   BILIRUBINUR neg 01/06/2014 1230   PROTEINUR neg 01/06/2014 1230   UROBILINOGEN negative 01/06/2014 1230   UROBILINOGEN 0.2 03/28/2010 0931   NITRITE neg 01/06/2014 1230   NITRITE positive 03/28/2010 0931   LEUKOCYTESUR Negative 01/06/2014 1230   Sepsis Labs Invalid input(s): PROCALCITONIN,  WBC,  LACTICIDVEN Microbiology Recent Results (from the past 240 hour(s))  SARS Coronavirus 2 (CEPHEID - Performed in [place] hospital lab), Hosp Order     Status: None   Collection Time: 12/22/18 12:17 AM  Result Value Ref Range Status   SARS Coronavirus 2 NEGATIVE NEGATIVE Final    Comment: (NOTE) If result is  NEGATIVE SARS-CoV-2 target nucleic acids are NOT DETECTED. The SARS-CoV-2 RNA is generally detectable in upper and lower  respiratory specimens during the acute phase of infection. The lowest  concentration of SARS-CoV-2 viral copies this assay can detect is 250  copies / mL.  A negative result does not preclude SARS-CoV-2 infection  and should not be used as the sole basis for treatment or other  patient management decisions.  A negative result may occur with  improper specimen collection / handling, submission of specimen other  than nasopharyngeal swab, presence of viral mutation(s) within the  areas targeted by this assay, and inadequate number of viral copies  (<250 copies / mL). A negative result must be combined with clinical  observations, patient history, and epidemiological information. If result is POSITIVE SARS-CoV-2 target nucleic acids are DETECTED. The SARS-CoV-2 RNA is generally detectable in upper and lower  respiratory specimens dur ing the acute phase of infection.  Positive  results are indicative of active infection with SARS-CoV-2.  Clinical  correlation with patient history and other diagnostic information is  necessary to determine patient infection status.  Positive results do  not rule out bacterial infection or co-infection with other viruses. If result is PRESUMPTIVE POSTIVE SARS-CoV-2 nucleic acids MAY BE PRESENT.   A presumptive positive result was obtained on the submitted specimen  and confirmed on repeat testing.  While 2019 novel coronavirus  (SARS-CoV-2) nucleic acids may be present in the submitted sample  additional confirmatory testing may be necessary for epidemiological  and / or clinical management purposes  to differentiate between  SARS-CoV-2 and other Sarbecovirus currently known to infect humans.  If clinically indicated additional testing with an alternate test  methodology [phone]) is advised. The SARS-CoV-2 RNA is generally  detectable  in upper and lower respiratory sp ecimens during the acute  phase of infection. The expected result is Negative. Fact Sheet for Patients:  StrictlyIdeas.no Fact Sheet for Healthcare Providers: BankingDealers.co.za This test is not yet approved or cleared by the [place] FDA and has been authorized for detection and/or diagnosis of SARS-CoV-2 by FDA under an Emergency Use Authorization (EUA).  This EUA will remain in effect (meaning this test can be used) for the duration of the COVID-19 declaration under Section 564(b)(1) of the Act, 21 U.S.C. section 360bbb-3(b)(1), unless the authorization is terminated or revoked sooner. Performed at [facility], [place] [address], [place], [place] [address]      Time coordinating discharge: 35 minutes  SIGNED:   Nicolette Bang, MD  [facility] 12/22/2018, 4:06 PM Pager   If 7PM-7AM, please contact night-coverage [email or web address] Password [email or web address]

## 2018-12-22 NOTE — Progress Notes (Signed)
Lexiscan portion of test complete. Patient in no distress.

## 2018-12-22 NOTE — Progress Notes (Signed)
Pt refused treatment

## 2018-12-22 NOTE — Progress Notes (Signed)
Obtained new orders for elevated CBG. Pt given one time dose of novolog as well as novolin N. Pt states she ate "lots of sugar" after stress test. Pt asymptomatic. Given written and discharge instructions. IV pulled and tolerated wlel.

## 2018-12-22 NOTE — Progress Notes (Signed)
Patient admitted after midnight with chest pain for rule out. Pain resolved this am. Evaluated by cardiology who recommend stress test this afternoon. Heart score 4.   A/P  1. Chest pain. Typical and atypical features. Hx of hyperlipidemia, DM SVT, autoimmune hepatitis. Pain at rest on one occasion and with exertion on another. Troponin flat 0.03 x3. ekg with SR abnormal r wave progression. Pain free this am. Evaluated by cards who recommend stress test today  2. DM type 2. Serum glucose 139 this am.  -ssi  3. Hyperlipidemia. Cannot tolerate statin. Obtain lipid panel  4. Hx SVT. ekg as noted above. Home meds include BB. Continue  5. Autoimmune hepatitis. Recently started on daily prednisone.   Disp: possible discharge this afternoon if stress test result ok.    [name] Glad m. Darien Mignogna, NP

## 2018-12-22 NOTE — ED Notes (Signed)
ED TO INPATIENT HANDOFF REPORT  ED Nurse Name and Phone #: Tray [place], [phone]  S Name/Age/Gender [name] [name] 73 y.o. female Room/Bed: 031C/031C  Code Status   Code Status: Full Code  Home/SNF/Other Home Patient oriented to: self, place, time and situation Is this baseline? Yes   Triage Complete: Triage complete  Chief Complaint Chest Pain   Triage Note Pt to ED with c/o mid chest pain  Pt described pain as feeling like a bubble, st's pain radiated up into neck and into bil temples.  Pt took 4 baby asa prior to EMS arrival   Allergies Allergies  Allergen Reactions  . Doxycycline Hives  . Epinephrine Hcl Other (See Comments)    REACTION: Had tremors when it was given.  . Erythromycin Ethylsuccinate Other (See Comments)    REACTION: abd spasms  . Iohexol Other (See Comments)     Code: HIVES, Desc: pt needs premedicated-- hives on prev contrast study per md office   . Oxycodone Other (See Comments)    "felt like I was dying"    Level of Care/Admitting Diagnosis ED Disposition    ED Disposition Condition [place]: [place] [100100]  Level of Care: Telemetry Cardiac [103]  I expect the patient will be discharged within 24 hours: No (not a candidate for 5C-Observation unit)  Covid Evaluation: N/A  Diagnosis: Chest pain [570177]  Admitting Physician: [name] [id number]  Attending Physician: [name] [[id number]]  PT Class (Do Not Modify): Observation [104]  PT Acc Code (Do Not Modify): Observation [10022]       B Medical/Surgery History Past Medical History:  Diagnosis Date  . Abnormal abdominal CT scan 04/2006   slight abn liver  . Abnormal chest CT 10/07   unable to bx  . Abnormal CT scan, pancreas or bile duct 04/2006   cyst  . Acute cystitis   . Acute pharyngitis   . Allergic rhinitis, cause unspecified   . Arthritis    hands, feet and back  . Benign neoplasm of colon   . Cyst  and pseudocyst of pancreas   . Depressive disorder, not elsewhere classified   . Diverticulitis   . Diverticulosis of colon (without mention of hemorrhage)   . Dysthymic disorder   . Dysuria   . Edema   . Esophageal reflux   . Essential and other specified forms of tremor   . Irritable bowel syndrome   . Obesity, unspecified   . Other diseases of lung, not elsewhere classified   . Other specified cardiac dysrhythmias(427.89)   . Pain in joint, lower leg   . Periapical abscess without sinus   . Polyneuropathy in diabetes(357.2)   . Pure hypercholesterolemia   . Sleep apnea    CPAP- non compliant  . Type II or unspecified type diabetes mellitus with neurological manifestations, not stated as uncontrolled(250.60)   . Type II or unspecified type diabetes mellitus without mention of complication, not stated as uncontrolled   . Unspecified disorder of liver   . Unspecified sleep apnea   . Unspecified urinary incontinence    Past Surgical History:  Procedure Laterality Date  . ABDOMINAL HYSTERECTOMY    . CALCANEAL OSTEOTOMY Right 07/27/2015   Procedure: RIGHT CALCANEAL OSTEOTOMY ;  Surgeon: Wylene Simmer, MD;  Location: [facility];  Service: Orthopedics;  Laterality: Right;  . CHOLECYSTECTOMY     15-20 years ago  . GASTROCNEMIUS RECESSION Right 07/27/2015   Procedure:  RIGHT GASTROC RECESSION;  Surgeon: Wylene Simmer, MD;  Location: [facility];  Service: Orthopedics;  Laterality: Right;  . KIDNEY STONE SURGERY  1980's   removal  . 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 PARTIAL HYSTERECTOMY  1990's  . ROTATOR CUFF REPAIR  2005     A IV Location/Drains/Wounds Patient Lines/Drains/Airways Status   Active Line/Drains/Airways    Name:   Placement date:   Placement time:   Site:   Days:   Peripheral IV 12/21/18 Left Antecubital   12/21/18    -    Antecubital   1   Incision (Closed) 07/27/15 Foot Right   07/27/15    1223     1244          Intake/Output Last  24 hours No intake or output data in the 24 hours ending 12/22/18 0145  Labs/Imaging Results for orders placed or performed during the hospital encounter of 12/21/18 (from the past 48 hour(s))  CBC with Differential     Status: Abnormal   Collection Time: 12/21/18  9:52 PM  Result Value Ref Range   WBC 8.7 4.0 - 10.5 K/uL   RBC 4.54 3.87 - 5.11 MIL/uL   Hemoglobin 13.9 12.0 - 15.0 g/dL   HCT 42.2 36.0 - 46.0 %   MCV 93.0 80.0 - 100.0 fL   MCH 30.6 26.0 - 34.0 pg   MCHC 32.9 30.0 - 36.0 g/dL   RDW 13.9 11.5 - 15.5 %   Platelets 139 (L) 150 - 400 K/uL    Comment: REPEATED TO VERIFY SPECIMEN CHECKED FOR CLOTS    nRBC 0.0 0.0 - 0.2 %   Neutrophils Relative % 62 %   Neutro Abs 5.4 1.7 - 7.7 K/uL   Lymphocytes Relative 30 %   Lymphs Abs 2.6 0.7 - 4.0 K/uL   Monocytes Relative 7 %   Monocytes Absolute 0.6 0.1 - 1.0 K/uL   Eosinophils Relative 0 %   Eosinophils Absolute 0.0 0.0 - 0.5 K/uL   Basophils Relative 0 %   Basophils Absolute 0.0 0.0 - 0.1 K/uL   Immature Granulocytes 1 %   Abs Immature Granulocytes 0.04 0.00 - 0.07 K/uL    Comment: Performed at [facility], [address], [place], [place] [address]  Basic metabolic panel     Status: Abnormal   Collection Time: 12/21/18  9:52 PM  Result Value Ref Range   Sodium 139 135 - 145 mmol/L   Potassium 3.8 3.5 - 5.1 mmol/L   Chloride 103 98 - 111 mmol/L   CO2 24 22 - 32 mmol/L   Glucose, Bld 197 (H) 70 - 99 mg/dL   BUN 29 (H) 8 - 23 mg/dL   Creatinine, Ser 1.19 (H) 0.44 - 1.00 mg/dL   Calcium 10.6 (H) 8.9 - 10.3 mg/dL   GFR calc non Af Amer 46 (L) >60 mL/min   GFR calc Af Amer 53 (L) >60 mL/min   Anion gap 12 5 - 15    Comment: Performed at [facility] [address], [place], [place] [address]  Troponin I - ONCE - STAT     Status: None   Collection Time: 12/21/18  9:52 PM  Result Value Ref Range   Troponin I <0.03 <0.03 ng/mL    Comment: Performed at [facility], [place] [address] [place], [place]  [address]  Hepatic function panel     Status: Abnormal   Collection Time: 12/21/18  9:52 PM  Result Value Ref Range   Total Protein 6.5 6.5 - 8.1 g/dL   Albumin 3.3 (L) 3.5 - 5.0 g/dL   AST 49 (H) 15 - 41 U/L   ALT 72 (H) 0 - 44 U/L   Alkaline Phosphatase 139 (H) 38 - 126 U/L   Total Bilirubin 0.9 0.3 - 1.2 mg/dL   Bilirubin, Direct 0.2 0.0 - 0.2 mg/dL   Indirect Bilirubin 0.7 0.3 - 0.9 mg/dL    Comment: Performed at [facility] [address] [place], [place] [address]  Lipase, blood     Status: Abnormal   Collection Time: 12/21/18  9:52 PM  Result Value Ref Range   Lipase 52 (H) 11 - 51 U/L    Comment: Performed at [facility] [address], [place], [place] [address]  SARS Coronavirus 2 (CEPHEID - Performed in [facility] hospital lab), Hosp Order     Status: None   Collection Time: 12/22/18 12:17 AM  Result Value Ref Range   SARS Coronavirus 2 NEGATIVE NEGATIVE    Comment: (NOTE) If result is NEGATIVE SARS-CoV-2 target nucleic acids are NOT DETECTED. The SARS-CoV-2 RNA is generally detectable in upper and lower  respiratory specimens during the acute phase of infection. The lowest  concentration of SARS-CoV-2 viral copies this assay can detect is 250  copies / mL. A negative result does not preclude SARS-CoV-2 infection  and should not be used as the sole basis for treatment or other  patient management decisions.  A negative result may occur with  improper specimen collection / handling, submission of specimen other  than nasopharyngeal swab, presence of viral mutation(s) within the  areas targeted by this assay, and inadequate number of viral copies  (<250 copies / mL). A negative result must be combined with clinical  observations, patient history, and epidemiological information. If result is POSITIVE SARS-CoV-2 target nucleic acids are DETECTED. The SARS-CoV-2 RNA is generally detectable in upper and lower  respiratory specimens dur ing the acute phase  of infection.  Positive  results are indicative of active infection with SARS-CoV-2.  Clinical  correlation with patient history and other diagnostic information is  necessary to determine patient infection status.  Positive results do  not rule out bacterial infection or co-infection with other viruses. If result is PRESUMPTIVE POSTIVE SARS-CoV-2 nucleic acids MAY BE PRESENT.   A presumptive positive result was obtained on the submitted specimen  and confirmed on repeat testing.  While 2019 novel coronavirus  (SARS-CoV-2) nucleic acids may be present in the submitted sample  additional confirmatory testing may be necessary for epidemiological  and / or clinical management purposes  to differentiate between  SARS-CoV-2 and other Sarbecovirus currently known to infect humans.  If clinically indicated additional testing with an alternate test  methodology [phone]) is advised. The SARS-CoV-2 RNA is generally  detectable in upper and lower respiratory sp ecimens during the acute  phase of infection. The expected result is Negative. Fact Sheet for Patients:  StrictlyIdeas.no Fact Sheet for Healthcare Providers: BankingDealers.co.za This test is not yet approved or cleared by the [place] FDA and has been authorized for detection and/or diagnosis of SARS-CoV-2 by FDA under an Emergency Use Authorization (EUA).  This EUA will remain in effect (meaning this test can be used) for the duration of the COVID-19 declaration under Section 564(b)(1) of the Act, 21 U.S.C. section 360bbb-3(b)(1), unless the authorization is terminated or revoked sooner. Performed at [facility], [address] [place], [place] [address]    Dg Chest Portable 1 View  Result Date: 12/21/2018 CLINICAL DATA:  Chest pain EXAM: PORTABLE CHEST 1 VIEW COMPARISON:  12/14/2017 FINDINGS: The heart size and mediastinal contours are within normal limits. Both lungs are  clear. The visualized skeletal structures are unremarkable. There is a stable appearance of the distal right clavicle. IMPRESSION: No active disease. Electronically Signed   By: Constance Holster M.D.   On: 12/21/2018 22:25    Pending Labs Unresulted Labs (From admission, onward)    Start     Ordered   12/29/18 0500  Creatinine, serum  (enoxaparin (LOVENOX)    CrCl >/= 30 ml/min)  Weekly,   R    Comments:  while on enoxaparin therapy    12/22/18 0041   12/22/18 2979  Basic metabolic panel  Tomorrow morning,   R     12/22/18 0041   12/22/18 0500  CBC  Tomorrow morning,   R     12/22/18 0041   12/22/18 0041  Troponin I - Now Then Q6H  Now then every 6 hours,   R     12/22/18 0041   12/22/18 0040  CBC  (enoxaparin (LOVENOX)    CrCl >/= 30 ml/min)  Once,   R    Comments:  Baseline for enoxaparin therapy IF NOT ALREADY DRAWN.  Notify MD if PLT < 100 K.    12/22/18 0041   12/22/18 0040  Creatinine, serum  (enoxaparin (LOVENOX)    CrCl >/= 30 ml/min)  Once,   R    Comments:  Baseline for enoxaparin therapy IF NOT ALREADY DRAWN.    12/22/18 0041          Vitals/Pain Today's Vitals   12/21/18 2131 12/21/18 2309 12/22/18 0005 12/22/18 0034  BP: (!) 149/64     Pulse: (!) 58     Resp: 12     Temp: 98.4 F (36.9 C)     TempSrc: Oral     SpO2: 95%     Weight:  100.2 kg    Height:  5' 4"  (1.626 m)    PainSc:  0-No pain 3  4     Isolation Precautions No active isolations  Medications Medications  nitroGLYCERIN (NITROSTAT) SL tablet 0.4 mg (0.4 mg Sublingual Given 12/22/18 0034)  aspirin chewable tablet 81 mg (has no administration in time range)  atenolol (TENORMIN) tablet 25 mg (has no administration in time range)  furosemide (LASIX) tablet 40 mg (has no administration in time range)  pantoprazole (PROTONIX) EC tablet 40 mg (has no administration in time range)  albuterol (PROVENTIL) (2.5 MG/3ML) 0.083% nebulizer solution 3 mL (has no administration in time range)   mometasone-formoterol (DULERA) 100-5 MCG/ACT inhaler 2 puff (has no administration in time range)  acetaminophen (TYLENOL) tablet 650 mg (has no administration in time range)    Or  acetaminophen (TYLENOL) suppository 650 mg (has no administration in time range)  ondansetron (ZOFRAN) tablet 4 mg (has no administration in time range)    Or  ondansetron (ZOFRAN) injection 4 mg (has no administration in time range)  insulin aspart (novoLOG) injection 0-9 Units (has no administration in time range)  enoxaparin (LOVENOX) injection 40 mg (has no administration in time range)    Mobility walks Low fall risk   Focused Assessments Cardiac Assessment Handoff:    Lab Results  Component Value Date   TROPONINI <0.03 12/21/2018   Lab Results  Component Value Date  DDIMER 0.76 (H) 03/26/2017   Does the Patient currently have chest pain? Yes     R Recommendations: See Admitting Provider Note  Report given to:   Additional Notes: c/o 2/10 CP

## 2018-12-23 ENCOUNTER — Telehealth: Payer: Self-pay

## 2018-12-23 NOTE — Telephone Encounter (Signed)
Copied from [place] [phone]. Topic: Appointment Scheduling - Scheduling Inquiry for Clinic >> Dec 22, 2018  4:23 PM Berneta Levins wrote: Reason for CRM:   Alinda Sierras from [facility] calling to get a hospital follow up for pt, being discharged today. Per Alinda Sierras pt's best contact number is [phone], or [phone].

## 2018-12-23 NOTE — Telephone Encounter (Signed)
Transitional Care Management Follow-up Telephone Call    Date discharged? 12/22/2018  How have you been since you were released from the hospital? Recovering.   Any patient concerns? None at this time   Items Reviewed:  Medications reviewed: Yes  Allergies reviewed: Yes  Dietary changes reviewed: Yes  Referrals reviewed: Yes   Functional Questionnaire:  Independent - I Dependent - D    Activities of Daily Living (ADLs):    Personal hygiene - I Dressing - I Eating - I Maintaining continence - I Transferring - I   Independent Activities of Daily Living (iADLs): Basic communication skills - I Transportation - I Meal preparation  - I Shopping - I Housework - I Managing medications - I  Managing personal finances - I   Confirmed importance and date/time of follow-up visits scheduled YES  Provider Appointment booked with PCP 12/29/18 @ 1100  Confirmed with patient if condition begins to worsen call PCP or go to the ER.  Patient was given the office number and encouraged to call back with question or concerns: YES

## 2018-12-23 NOTE — Telephone Encounter (Signed)
Cardiology saw patient yesterday

## 2018-12-23 NOTE — Telephone Encounter (Signed)
[place] Phone visit  Hospital follow  Appointment 6/9

## 2018-12-29 ENCOUNTER — Encounter: Payer: Self-pay | Admitting: Family Medicine

## 2018-12-29 ENCOUNTER — Ambulatory Visit (INDEPENDENT_AMBULATORY_CARE_PROVIDER_SITE_OTHER): Payer: Medicare Other | Admitting: Family Medicine

## 2018-12-29 DIAGNOSIS — E7800 Pure hypercholesterolemia, unspecified: Secondary | ICD-10-CM | POA: Diagnosis not present

## 2018-12-29 DIAGNOSIS — K754 Autoimmune hepatitis: Secondary | ICD-10-CM

## 2018-12-29 DIAGNOSIS — R079 Chest pain, unspecified: Secondary | ICD-10-CM | POA: Diagnosis not present

## 2018-12-29 DIAGNOSIS — E1165 Type 2 diabetes mellitus with hyperglycemia: Secondary | ICD-10-CM | POA: Diagnosis not present

## 2018-12-29 NOTE — Assessment & Plan Note (Signed)
Followed by ENDO. Will request A1C, foot exam, microalbumin etc. Encouraged pt to have eye exam done.  Encouraged exercise, weight loss, healthy eating habits.

## 2018-12-29 NOTE — Assessment & Plan Note (Signed)
Followed by GI, Dr. Benson Norway. Expect longterm steroid per pt.

## 2018-12-29 NOTE — Progress Notes (Signed)
VIRTUAL VISIT Due to national recommendations of social distancing due to [place] 19, a virtual visit is felt to be most appropriate for this patient at this time.   I connected with the patient on 12/29/18 at 11:00 AM EDT by virtual telehealth platform and verified that I am speaking with the correct person using two identifiers.   Interactive audio and video telecommunications were attempted between this provider and patient, however failed, due to patient having technical difficulties OR patient did not have access to video capability.  We continued and completed visit with audio only.    I discussed the limitations, risks, security and privacy concerns of performing an evaluation and management service by  virtual telehealth platform and the availability of in person appointments. I also discussed with the patient that there may be a patient responsible charge related to this service. The patient expressed understanding and agreed to proceed.  Patient location: Home Provider Location: [facility] Participants: Eliezer Lofts and Harvel Quale   Chief Complaint  Patient presents with  . Hospitalization Follow-up    Chest Pain    History of Present Illness:  73 year old diabetic female history of autoimmune hepatitis recently started on prednisone, diabetes mellitus type 2, palpitations and peripheral edema on Lasix presents for hospital follow up. I have not seen her in office since 2018. Overdue for CPX. She has many care gaps.   She was admitted 12/21/2018 for observation. Hospital course: In the ER patient was seen by hospitalist service and admitted for further evaluation of atypical chest pain.  She was kept on aspirin as needed nitro and was continued on atenolol.  Patient was seen by cardiology in consultation with recommendations for stress test.  Cardiac enzymes were negative stress test   Autoimmune hepatits: pt was recently started on prednisone 60 then titrate  down  To 20 mg.  She will be continued on this as an outpt and follow with her primary prescriber for f/up  12/29/18   Stress test Dr. Kennon Holter PA  Low risk.  Felt could be secondary to steroid Neg hepatic panel, lipase.  She reports today that she is having muscle cramping in back, limbs as well. Weaning down on prednisone for autoimmune hepatitis.Marland Kitchen but will need to be on some likely lifelong.   Followed by Dr. Allene Pyo. May start additional  immunosuprressant as well  No further chest pain.  Diabetes:  Followed by Dr. Chalmers Cater ENDO.. changing over to insulin pump Last A1C 9.5 3-4 weeks ago. Using medications without difficulties: Hypoglycemic episodes:none Hyperglycemic episodes: yes Feet problems:none Blood Sugars averaging: FBS  Poor control. eye exam within last year: due  COVID 19 screen No recent travel or known exposure to [place] The patient denies respiratory symptoms of COVID 19 at this time.  The importance of social distancing was discussed today.   ROS    Past Medical History:  Diagnosis Date  . Abnormal abdominal CT scan 04/2006   slight abn liver  . Abnormal chest CT 10/07   unable to bx  . Abnormal CT scan, pancreas or bile duct 04/2006   cyst  . Acute cystitis   . Acute pharyngitis   . Allergic rhinitis, cause unspecified   . Arthritis    hands, feet and back  . Benign neoplasm of colon   . Complication of anesthesia    " i WAKE UP WITH TREMORS "  . Cyst and pseudocyst of pancreas   . Depressive disorder, not elsewhere classified   .  Diverticulitis   . Diverticulosis of colon (without mention of hemorrhage)   . Dysthymic disorder   . Dysuria   . Edema   . Esophageal reflux   . Essential and other specified forms of tremor   . Irritable bowel syndrome   . Obesity, unspecified   . Other diseases of lung, not elsewhere classified   . Other specified cardiac dysrhythmias(427.89)   . Pain in joint, lower leg   . Periapical abscess without sinus   .  Polyneuropathy in diabetes(357.2)   . Pure hypercholesterolemia   . Sleep apnea    CPAP- non compliant  . Type II or unspecified type diabetes mellitus with neurological manifestations, not stated as uncontrolled(250.60)   . Type II or unspecified type diabetes mellitus without mention of complication, not stated as uncontrolled   . Unspecified disorder of liver   . Unspecified sleep apnea   . Unspecified urinary incontinence     reports that she has never smoked. She has never used smokeless tobacco. She reports that she does not drink alcohol or use drugs.   Current Outpatient Medications:  .  albuterol (PROAIR HFA) 108 (90 Base) MCG/ACT inhaler, Inhale 2 puffs into the lungs every 6 (six) hours as needed for wheezing or shortness of breath., Disp: 1 Inhaler, Rfl: 0 .  aspirin 81 MG tablet, Take 81 mg by mouth daily., Disp: , Rfl:  .  atenolol (TENORMIN) 25 MG tablet, Take 6.25 mg by mouth 2 (two) times daily., Disp: , Rfl:  .  fluticasone (FLONASE) 50 MCG/ACT nasal spray, Place 1 spray into both nostrils daily., Disp: , Rfl:  .  furosemide (LASIX) 40 MG tablet, Take 40 mg by mouth daily., Disp: , Rfl:  .  Insulin Pen Needle (B-D ULTRAFINE III SHORT PEN) 31G X 8 MM MISC, Use to inject insulin two times a day.  Dx: E11.9, Disp: 100 each, Rfl: 0 .  NEXIUM 40 MG capsule, TAKE 1 CAPSULE BY MOUTH DAILY, Disp: 90 capsule, Rfl: 1 .  NOVOLIN N RELION 100 UNIT/ML injection, Inject 50 Units into the skin 3 (three) times daily., Disp: , Rfl:  .  NOVOLIN R RELION 100 UNIT/ML injection, Inject 10-50 Units into the skin 3 (three) times daily. Sliding scale, Disp: , Rfl:  .  predniSONE (DELTASONE) 20 MG tablet, Take 1 tablet (20 mg total) by mouth daily with breakfast for 30 days., Disp: 30 tablet, Rfl: 0 .  Respiratory Therapy Supplies (FLUTTER) DEVI, Twice a day or as needed, Disp: 1 each, Rfl: 0   Observations/Objective: Pulse 62, height 5' 4.5" (1.638 m), SpO2 97 %.  Physical Exam  Physical  Exam Constitutional:      General: The patient is not in acute distress. Pulmonary:     Effort: Pulmonary effort is normal. No respiratory distress.  Neurological:     Mental Status: The patient is alert and oriented to person, place, and time.  Psychiatric:        Mood and Affect: Mood normal.        Behavior: Behavior normal.   Assessment and Plan Poorly controlled type 2 diabetes mellitus ([place]) Followed by ENDO. Will request A1C, foot exam, microalbumin etc. Encouraged pt to have eye exam done.  Encouraged exercise, weight loss, healthy eating habits.   Chest pain Has follow up with cardiology. Dr. Gwenlyn Found.  Neg stress test. Felt likely due to prednisone side effect.   Autoimmune hepatitis ([place]) Followed by GI, Dr. Benson Norway. Expect longterm steroid per pt.  HYPERCHOLESTEROLEMIA  Pt states Dr. Chalmers Cater and Gwenlyn Found following.     I discussed the assessment and treatment plan with the patient. The patient was provided an opportunity to ask questions and all were answered. The patient agreed with the plan and demonstrated an understanding of the instructions.   The patient was advised to call back or seek an in-person evaluation if the symptoms worsen or if the condition fails to improve as anticipated.   Total visit time 20 minutes, > 50% spent counseling and cordinating patients care.   Eliezer Lofts, MD

## 2018-12-29 NOTE — Assessment & Plan Note (Signed)
Has follow up with cardiology. Dr. Gwenlyn Found.  Neg stress test. Felt likely due to prednisone side effect.

## 2018-12-29 NOTE — Assessment & Plan Note (Addendum)
Pt states Dr. Chalmers Cater and Gwenlyn Found following.

## 2019-01-06 ENCOUNTER — Encounter: Payer: Self-pay | Admitting: Cardiovascular Disease

## 2019-01-06 ENCOUNTER — Other Ambulatory Visit: Payer: Self-pay

## 2019-01-06 ENCOUNTER — Ambulatory Visit (INDEPENDENT_AMBULATORY_CARE_PROVIDER_SITE_OTHER): Payer: Medicare Other | Admitting: Cardiovascular Disease

## 2019-01-06 DIAGNOSIS — E7800 Pure hypercholesterolemia, unspecified: Secondary | ICD-10-CM

## 2019-01-06 DIAGNOSIS — R079 Chest pain, unspecified: Secondary | ICD-10-CM

## 2019-01-06 NOTE — Progress Notes (Signed)
01/06/2019 Harvel Quale   1946-03-14  [id number]  Primary Physician Jinny Sanders, MD Primary Cardiologist: Lorretta Harp MD [name], [place]  HPI:  [name] [name] is a 73 y.o.  widowed Caucasian female mother of 5 children, grandmother of 80 grandchildren who is retired from doing office work.  I last saw her in the office 04/04/2017.  Prior to that, she had seen Dr. Acie Fredrickson and Dr. Johnsie Cancel . She has a history of obesity, chronic diastolic dysfunction on diuretics with lower extremity edema, palpitations with event monitoring June 2013 showing PACs and PVCs. 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 She does complain of episodic shortness of breath for which she takes bronchodilators on a rescue basis and sees Dr. Annamaria Boots for this. We saw her 3 months ago we had stopped her verapamil and transitioned her to atenolol. Apparently metoprolol was causing her loss she has taken the atenolol every third day. She's had several episodes of shortness of breath lasting all day improved with inhaled bronchodilators. She also has GERD with reactive airways disease as a result of this.  Since I saw her 2 years ago, she was admitted to the hospital in 12/21/2018 with atypical chest pain.  She ruled out for myocardial infarction.  A Myoview stress test performed the following day was nonischemic and low risk and she was discharged home.  She said no recurrent symptoms.   Current Meds  Medication Sig  . albuterol (PROAIR HFA) 108 (90 Base) MCG/ACT inhaler Inhale 2 puffs into the lungs every 6 (six) hours as needed for wheezing or shortness of breath.  Marland Kitchen aspirin 81 MG tablet Take 81 mg by mouth daily.  Marland Kitchen atenolol (TENORMIN) 25 MG tablet Take 6.25 mg by mouth 2 (two) times daily.  . fluticasone (FLONASE) 50 MCG/ACT nasal spray Place 1 spray into both nostrils daily.  . furosemide (LASIX) 40 MG tablet Take 40 mg by mouth daily.  . Insulin Pen Needle (B-D  ULTRAFINE III SHORT PEN) 31G X 8 MM MISC Use to inject insulin two times a day.  Dx: E11.9  . NEXIUM 40 MG capsule TAKE 1 CAPSULE BY MOUTH DAILY  . NOVOLIN N RELION 100 UNIT/ML injection Inject 50 Units into the skin 3 (three) times daily.  Marland Kitchen NOVOLIN R RELION 100 UNIT/ML injection Inject 10-50 Units into the skin 3 (three) times daily. Sliding scale  . predniSONE (DELTASONE) 20 MG tablet Take 1 tablet (20 mg total) by mouth daily with breakfast for 30 days.  Marland Kitchen Respiratory Therapy Supplies (FLUTTER) DEVI Twice a day or as needed     Allergies  Allergen Reactions  . Doxycycline Hives  . Epinephrine Hcl Other (See Comments)    REACTION: Had tremors when it was given.  . Erythromycin Ethylsuccinate Other (See Comments)    REACTION: abd spasms  . Iohexol Other (See Comments)     Code: HIVES, Desc: pt needs premedicated-- hives on prev contrast study per md office   . Oxycodone Other (See Comments)    "felt like I was dying"    Social History   Socioeconomic History  . Marital status: Widowed    Spouse name: Not on file  . Number of children: 5  . Years of education: Not on file  . Highest education level: Not on file  Occupational History    Employer: RETIRED  Social Needs  . Financial resource strain: Not on file  . Food insecurity  Worry: Not on file    Inability: Not on file  . Transportation needs    Medical: Not on file    Non-medical: Not on file  Tobacco Use  . Smoking status: Never Smoker  . Smokeless tobacco: Never Used  Substance and Sexual Activity  . Alcohol use: No    Alcohol/week: 0.0 standard drinks  . Drug use: No  . Sexual activity: Not on file  Lifestyle  . Physical activity    Days per week: Not on file    Minutes per session: Not on file  . Stress: Not on file  Relationships  . Social Herbalist on phone: Not on file    Gets together: Not on file    Attends religious service: Not on file    Active member of club or organization: Not  on file    Attends meetings of clubs or organizations: Not on file    Relationship status: Not on file  . Intimate partner violence    Fear of current or ex partner: Not on file    Emotionally abused: Not on file    Physically abused: Not on file    Forced sexual activity: Not on file  Other Topics Concern  . Not on file  Social History Narrative   Husband treated for testicular cancer, 2 cups coffee a day, no exercise     Review of Systems: General: negative for chills, fever, night sweats or weight changes.  Cardiovascular: negative for chest pain, dyspnea on exertion, edema, orthopnea, palpitations, paroxysmal nocturnal dyspnea or shortness of breath Dermatological: negative for rash Respiratory: negative for cough or wheezing Urologic: negative for hematuria Abdominal: negative for nausea, vomiting, diarrhea, bright red blood per rectum, melena, or hematemesis Neurologic: negative for visual changes, syncope, or dizziness All other systems reviewed and are otherwise negative except as noted above.    Blood pressure 132/78, pulse 70, temperature 98.4 F (36.9 C), height 5' 4.5" (1.638 m), weight 227 lb (103 kg).  General appearance: alert and no distress Neck: no adenopathy, no carotid bruit, no JVD, supple, symmetrical, trachea midline and thyroid not enlarged, symmetric, no tenderness/mass/nodules Lungs: clear to auscultation bilaterally Heart: regular rate and rhythm, S1, S2 normal, no murmur, click, rub or gallop Extremities: extremities normal, atraumatic, no cyanosis or edema Pulses: 2+ and symmetric Skin: Skin color, texture, turgor normal. No rashes or lesions Neurologic: Alert and oriented X 3, normal strength and tone. Normal symmetric reflexes. Normal coordination and gait  EKG not performed today  ASSESSMENT AND PLAN:   HYPERCHOLESTEROLEMIA History of hyperlipidemia not on statin therapy with lipid profile performed 01/07/2017 revealing total cholesterol 162,  LDL of 91 and HDL 38.  Chest pain Her enzymes are negative.  A Myoview stress test was performed which was low risk.  She is had no recurrent symptoms.      Lorretta Harp MD FACP,FACC,FAHA, [facility] 01/06/2019 12:54 PM

## 2019-01-06 NOTE — Patient Instructions (Signed)
Medication Instructions:  Your physician recommends that you continue on your current medications as directed. Please refer to the Current Medication list given to you today.  If you need a refill on your cardiac medications before your next appointment, please call your pharmacy.   Lab work: NONE If you have labs (blood work) drawn today and your tests are completely normal, you will receive your results only by: Marland Kitchen MyChart Message (if you have MyChart) OR . A paper copy in the mail If you have any lab test that is abnormal or we need to change your treatment, we will call you to review the results.  Testing/Procedures: NONE  Follow-Up: At [facility], you and your health needs are our priority.  As part of our continuing mission to provide you with exceptional heart care, we have created designated Provider Care Teams.  These Care Teams include your primary Cardiologist (physician) and Advanced Practice Providers (APPs -  Physician Assistants and Nurse Practitioners) who all work together to provide you with the care you need, when you need it. You will need a follow up appointment in 6 months WITH HAO MENG, PA-C AND IN 12 MONTHS WITH DR. Gwenlyn Found.  Please call our office 2 months in advance to schedule this appointment.

## 2019-01-06 NOTE — Assessment & Plan Note (Signed)
Her enzymes are negative.  A Myoview stress test was performed which was low risk.  She is had no recurrent symptoms.

## 2019-01-06 NOTE — Assessment & Plan Note (Signed)
History of hyperlipidemia not on statin therapy with lipid profile performed 01/07/2017 revealing total cholesterol 162, LDL of 91 and HDL 38.

## 2019-01-14 DIAGNOSIS — K754 Autoimmune hepatitis: Secondary | ICD-10-CM | POA: Diagnosis not present

## 2019-01-20 NOTE — Progress Notes (Signed)
Tried calling pt no answer 7/1/rbh

## 2019-01-28 NOTE — Progress Notes (Signed)
Spoke to pt. 01/28/2019 She declined to schedule cpx and medicare wellness.  She stated if is was not for the purpose of getting medication she was not going to schedule.  She stated she goes to dr all the time

## 2019-02-09 DIAGNOSIS — E1165 Type 2 diabetes mellitus with hyperglycemia: Secondary | ICD-10-CM | POA: Diagnosis not present

## 2019-02-09 DIAGNOSIS — I10 Essential (primary) hypertension: Secondary | ICD-10-CM | POA: Diagnosis not present

## 2019-02-09 DIAGNOSIS — R740 Nonspecific elevation of levels of transaminase and lactic acid dehydrogenase [LDH]: Secondary | ICD-10-CM | POA: Diagnosis not present

## 2019-02-09 DIAGNOSIS — E7800 Pure hypercholesterolemia, unspecified: Secondary | ICD-10-CM | POA: Diagnosis not present

## 2019-03-01 ENCOUNTER — Other Ambulatory Visit: Payer: Self-pay | Admitting: Cardiovascular Disease

## 2019-03-01 NOTE — Telephone Encounter (Signed)
Please advise If ok to refill Atenolol 25 mg tablet qd. Last filled by Historical Provider.

## 2019-03-17 DIAGNOSIS — K754 Autoimmune hepatitis: Secondary | ICD-10-CM | POA: Diagnosis not present

## 2019-03-17 DIAGNOSIS — R740 Nonspecific elevation of levels of transaminase and lactic acid dehydrogenase [LDH]: Secondary | ICD-10-CM | POA: Diagnosis not present

## 2019-03-17 DIAGNOSIS — K7460 Unspecified cirrhosis of liver: Secondary | ICD-10-CM | POA: Diagnosis not present

## 2019-03-18 DIAGNOSIS — K754 Autoimmune hepatitis: Secondary | ICD-10-CM | POA: Diagnosis not present

## 2019-04-06 DIAGNOSIS — H52203 Unspecified astigmatism, bilateral: Secondary | ICD-10-CM | POA: Diagnosis not present

## 2019-04-06 DIAGNOSIS — H5203 Hypermetropia, bilateral: Secondary | ICD-10-CM | POA: Diagnosis not present

## 2019-04-06 DIAGNOSIS — E119 Type 2 diabetes mellitus without complications: Secondary | ICD-10-CM | POA: Diagnosis not present

## 2019-05-06 DIAGNOSIS — H2513 Age-related nuclear cataract, bilateral: Secondary | ICD-10-CM | POA: Diagnosis not present

## 2019-06-10 DIAGNOSIS — E7800 Pure hypercholesterolemia, unspecified: Secondary | ICD-10-CM | POA: Diagnosis not present

## 2019-06-10 DIAGNOSIS — I10 Essential (primary) hypertension: Secondary | ICD-10-CM | POA: Diagnosis not present

## 2019-06-10 DIAGNOSIS — E1165 Type 2 diabetes mellitus with hyperglycemia: Secondary | ICD-10-CM | POA: Diagnosis not present

## 2019-06-10 DIAGNOSIS — R7401 Elevation of levels of liver transaminase levels: Secondary | ICD-10-CM | POA: Diagnosis not present

## 2019-08-06 ENCOUNTER — Ambulatory Visit: Payer: Medicare Other | Admitting: Dietician

## 2019-08-16 ENCOUNTER — Observation Stay (HOSPITAL_COMMUNITY): Payer: Medicare Other

## 2019-08-16 ENCOUNTER — Other Ambulatory Visit: Payer: Self-pay

## 2019-08-16 ENCOUNTER — Encounter (HOSPITAL_COMMUNITY): Payer: Self-pay | Admitting: *Deleted

## 2019-08-16 ENCOUNTER — Inpatient Hospital Stay (HOSPITAL_COMMUNITY)
Admission: EM | Admit: 2019-08-16 | Discharge: 2019-08-18 | DRG: 378 | Disposition: A | Discharge status: Home / Self Care | Payer: Medicare Other | Attending: Family Medicine | Admitting: Family Medicine

## 2019-08-16 DIAGNOSIS — K922 Gastrointestinal hemorrhage, unspecified: Secondary | ICD-10-CM | POA: Diagnosis present

## 2019-08-16 DIAGNOSIS — I8500 Esophageal varices without bleeding: Secondary | ICD-10-CM | POA: Diagnosis not present

## 2019-08-16 DIAGNOSIS — I8510 Secondary esophageal varices without bleeding: Secondary | ICD-10-CM | POA: Diagnosis present

## 2019-08-16 DIAGNOSIS — Z20822 Contact with and (suspected) exposure to covid-19: Secondary | ICD-10-CM | POA: Diagnosis present

## 2019-08-16 DIAGNOSIS — G4733 Obstructive sleep apnea (adult) (pediatric): Secondary | ICD-10-CM | POA: Diagnosis present

## 2019-08-16 DIAGNOSIS — K766 Portal hypertension: Secondary | ICD-10-CM | POA: Diagnosis present

## 2019-08-16 DIAGNOSIS — Z6839 Body mass index (BMI) 39.0-39.9, adult: Secondary | ICD-10-CM

## 2019-08-16 DIAGNOSIS — K5731 Diverticulosis of large intestine without perforation or abscess with bleeding: Principal | ICD-10-CM | POA: Diagnosis present

## 2019-08-16 DIAGNOSIS — E1165 Type 2 diabetes mellitus with hyperglycemia: Secondary | ICD-10-CM | POA: Diagnosis present

## 2019-08-16 DIAGNOSIS — D509 Iron deficiency anemia, unspecified: Secondary | ICD-10-CM | POA: Diagnosis not present

## 2019-08-16 DIAGNOSIS — D62 Acute posthemorrhagic anemia: Secondary | ICD-10-CM | POA: Diagnosis present

## 2019-08-16 DIAGNOSIS — I10 Essential (primary) hypertension: Secondary | ICD-10-CM | POA: Diagnosis present

## 2019-08-16 DIAGNOSIS — Z825 Family history of asthma and other chronic lower respiratory diseases: Secondary | ICD-10-CM

## 2019-08-16 DIAGNOSIS — K7460 Unspecified cirrhosis of liver: Secondary | ICD-10-CM | POA: Diagnosis not present

## 2019-08-16 DIAGNOSIS — K3189 Other diseases of stomach and duodenum: Secondary | ICD-10-CM | POA: Diagnosis present

## 2019-08-16 DIAGNOSIS — K754 Autoimmune hepatitis: Secondary | ICD-10-CM | POA: Diagnosis present

## 2019-08-16 DIAGNOSIS — Z91041 Radiographic dye allergy status: Secondary | ICD-10-CM

## 2019-08-16 DIAGNOSIS — E785 Hyperlipidemia, unspecified: Secondary | ICD-10-CM | POA: Diagnosis present

## 2019-08-16 DIAGNOSIS — D649 Anemia, unspecified: Secondary | ICD-10-CM | POA: Diagnosis present

## 2019-08-16 DIAGNOSIS — E7800 Pure hypercholesterolemia, unspecified: Secondary | ICD-10-CM | POA: Diagnosis present

## 2019-08-16 DIAGNOSIS — J45909 Unspecified asthma, uncomplicated: Secondary | ICD-10-CM | POA: Diagnosis present

## 2019-08-16 DIAGNOSIS — K635 Polyp of colon: Secondary | ICD-10-CM | POA: Diagnosis present

## 2019-08-16 DIAGNOSIS — F419 Anxiety disorder, unspecified: Secondary | ICD-10-CM | POA: Diagnosis present

## 2019-08-16 DIAGNOSIS — Z7982 Long term (current) use of aspirin: Secondary | ICD-10-CM

## 2019-08-16 DIAGNOSIS — E669 Obesity, unspecified: Secondary | ICD-10-CM | POA: Diagnosis present

## 2019-08-16 DIAGNOSIS — Z794 Long term (current) use of insulin: Secondary | ICD-10-CM

## 2019-08-16 DIAGNOSIS — R0602 Shortness of breath: Secondary | ICD-10-CM | POA: Diagnosis not present

## 2019-08-16 DIAGNOSIS — K625 Hemorrhage of anus and rectum: Secondary | ICD-10-CM | POA: Diagnosis not present

## 2019-08-16 DIAGNOSIS — Z832 Family history of diseases of the blood and blood-forming organs and certain disorders involving the immune mechanism: Secondary | ICD-10-CM

## 2019-08-16 DIAGNOSIS — Z833 Family history of diabetes mellitus: Secondary | ICD-10-CM

## 2019-08-16 DIAGNOSIS — K219 Gastro-esophageal reflux disease without esophagitis: Secondary | ICD-10-CM | POA: Diagnosis present

## 2019-08-16 DIAGNOSIS — Z86010 Personal history of colonic polyps: Secondary | ICD-10-CM

## 2019-08-16 LAB — CBC
HCT: 40 % (ref 36.0–46.0)
HCT: 32.8 % — ABNORMAL LOW (ref 36.0–46.0)
HCT: 31.4 % — ABNORMAL LOW (ref 36.0–46.0)
Hemoglobin: 12.9 g/dL (ref 12.0–15.0)
Hemoglobin: 10.6 g/dL — ABNORMAL LOW (ref 12.0–15.0)
Hemoglobin: 10.2 g/dL — ABNORMAL LOW (ref 12.0–15.0)
MCH: 30.3 pg (ref 26.0–34.0)
MCH: 30.4 pg (ref 26.0–34.0)
MCH: 30.6 pg (ref 26.0–34.0)
MCHC: 32.3 g/dL (ref 30.0–36.0)
MCHC: 32.3 g/dL (ref 30.0–36.0)
MCHC: 32.5 g/dL (ref 30.0–36.0)
MCV: 93.9 fL (ref 80.0–100.0)
MCV: 94 fL (ref 80.0–100.0)
MCV: 94.3 fL (ref 80.0–100.0)
Platelets: 167 10*3/uL (ref 150–400)
Platelets: 130 10*3/uL — ABNORMAL LOW (ref 150–400)
Platelets: 123 10*3/uL — ABNORMAL LOW (ref 150–400)
RBC: 4.26 MIL/uL (ref 3.87–5.11)
RBC: 3.49 MIL/uL — ABNORMAL LOW (ref 3.87–5.11)
RBC: 3.33 MIL/uL — ABNORMAL LOW (ref 3.87–5.11)
RDW: 13.7 % (ref 11.5–15.5)
RDW: 13.8 % (ref 11.5–15.5)
RDW: 14 % (ref 11.5–15.5)
WBC: 7.4 10*3/uL (ref 4.0–10.5)
WBC: 5.4 10*3/uL (ref 4.0–10.5)
WBC: 4.9 10*3/uL (ref 4.0–10.5)
nRBC: 0 % (ref 0.0–0.2)
nRBC: 0 % (ref 0.0–0.2)
nRBC: 0 % (ref 0.0–0.2)

## 2019-08-16 LAB — GLUCOSE, CAPILLARY
Glucose-Capillary: 84 mg/dL (ref 70–99)
Glucose-Capillary: 79 mg/dL (ref 70–99)
Glucose-Capillary: 91 mg/dL (ref 70–99)

## 2019-08-16 LAB — COMPREHENSIVE METABOLIC PANEL
ALT: 30 U/L (ref 0–44)
AST: 36 U/L (ref 15–41)
Albumin: 3.2 g/dL — ABNORMAL LOW (ref 3.5–5.0)
Alkaline Phosphatase: 99 U/L (ref 38–126)
Anion gap: 12 (ref 5–15)
BUN: 19 mg/dL (ref 8–23)
CO2: 22 mmol/L (ref 22–32)
Calcium: 9.7 mg/dL (ref 8.9–10.3)
Chloride: 106 mmol/L (ref 98–111)
Creatinine, Ser: 0.88 mg/dL (ref 0.44–1.00)
GFR calc Af Amer: >60 mL/min (ref >60)
GFR calc non Af Amer: >60 mL/min (ref >60)
Glucose, Bld: 133 mg/dL — ABNORMAL HIGH (ref 70–99)
Potassium: 3.7 mmol/L (ref 3.5–5.1)
Sodium: 140 mmol/L (ref 135–145)
Total Bilirubin: 0.8 mg/dL (ref 0.3–1.2)
Total Protein: 6.1 g/dL — ABNORMAL LOW (ref 6.5–8.1)

## 2019-08-16 LAB — TYPE AND SCREEN
ABO/RH(D): A POS
Antibody Screen: NEGATIVE

## 2019-08-16 LAB — HEMOGLOBIN A1C
Hgb A1c MFr Bld: 7 % — ABNORMAL HIGH (ref 4.8–5.6)
Mean Plasma Glucose: 154.2 mg/dL

## 2019-08-16 LAB — RESPIRATORY PANEL BY RT PCR (FLU A&B, COVID)
Influenza A by PCR: NEGATIVE
Influenza B by PCR: NEGATIVE
SARS Coronavirus 2 by RT PCR: NEGATIVE

## 2019-08-16 LAB — PROTIME-INR
INR: 1 (ref 0.8–1.2)
Prothrombin Time: 13.2 seconds (ref 11.4–15.2)

## 2019-08-16 LAB — ABO/RH: ABO/RH(D): A POS

## 2019-08-16 MED ORDER — ACETAMINOPHEN 325 MG PO TABS: 650.0000 mg | ORAL_TABLET | Freq: Four times a day (QID) | ORAL | Status: DC | PRN

## 2019-08-16 MED ORDER — LORATADINE 10 MG PO TABS
10.0000 mg | ORAL_TABLET | Freq: Every day | ORAL | Status: DC
  Administered 2019-08-16 – 2019-08-18 (×3): 10 mg via ORAL
  Filled 2019-08-16 (×3): qty 1

## 2019-08-16 MED ORDER — TECHNETIUM TC 99M-LABELED RED BLOOD CELLS IV KIT
20.9000 | PACK | Freq: Once | INTRAVENOUS | Status: AC | PRN
  Administered 2019-08-16: 20.9 via INTRAVENOUS

## 2019-08-16 MED ORDER — ONDANSETRON HCL 4 MG/2ML IJ SOLN
4.0000 mg | Freq: Once | INTRAMUSCULAR | Status: AC
  Administered 2019-08-16: 08:00:00 4 mg via INTRAVENOUS
  Filled 2019-08-16: qty 2

## 2019-08-16 MED ORDER — PANTOPRAZOLE SODIUM 40 MG IV SOLR
40.0000 mg | Freq: Two times a day (BID) | INTRAVENOUS | Status: DC
  Administered 2019-08-16 – 2019-08-18 (×5): 40 mg via INTRAVENOUS
  Filled 2019-08-16 (×6): qty 40

## 2019-08-16 MED ORDER — LACTATED RINGERS IV SOLN: INTRAVENOUS | Status: DC

## 2019-08-16 MED ORDER — AZATHIOPRINE 50 MG PO TABS
50.0000 mg | ORAL_TABLET | Freq: Every day | ORAL | Status: DC
  Administered 2019-08-16 – 2019-08-18 (×3): 50 mg via ORAL
  Filled 2019-08-16 (×3): qty 1

## 2019-08-16 MED ORDER — ONDANSETRON HCL 4 MG PO TABS: 4.0000 mg | ORAL_TABLET | Freq: Four times a day (QID) | ORAL | Status: DC | PRN

## 2019-08-16 MED ORDER — INSULIN ASPART 100 UNIT/ML ~~LOC~~ SOLN
0.0000 [IU] | Freq: Three times a day (TID) | SUBCUTANEOUS | Status: DC
  Administered 2019-08-17 – 2019-08-18 (×3): 3 [IU] via SUBCUTANEOUS
  Administered 2019-08-18: 7 [IU] via SUBCUTANEOUS

## 2019-08-16 MED ORDER — FLUTICASONE PROPIONATE 50 MCG/ACT NA SUSP
1.0000 | Freq: Every day | NASAL | Status: DC
  Administered 2019-08-16 – 2019-08-18 (×3): 1 via NASAL
  Filled 2019-08-16: qty 16

## 2019-08-16 MED ORDER — SODIUM CHLORIDE 0.9 % IV SOLN
50.0000 ug/h | INTRAVENOUS | Status: DC
  Administered 2019-08-16 – 2019-08-18 (×4): 50 ug/h via INTRAVENOUS
  Filled 2019-08-16 (×6): qty 1

## 2019-08-16 MED ORDER — GUAIFENESIN ER 600 MG PO TB12: 600.0000 mg | ORAL_TABLET | Freq: Two times a day (BID) | ORAL | Status: DC | PRN

## 2019-08-16 MED ORDER — SODIUM CHLORIDE 0.9 % IV BOLUS
1000.0000 mL | Freq: Once | INTRAVENOUS | Status: AC
  Administered 2019-08-16: 1000 mL via INTRAVENOUS

## 2019-08-16 MED ORDER — ONDANSETRON HCL 4 MG/2ML IJ SOLN: 4.0000 mg | Freq: Four times a day (QID) | INTRAMUSCULAR | Status: DC | PRN

## 2019-08-16 MED ORDER — INSULIN ASPART 100 UNIT/ML ~~LOC~~ SOLN: 0.0000 [IU] | Freq: Every day | SUBCUTANEOUS | Status: DC

## 2019-08-16 MED ORDER — PEG 3350-KCL-NA BICARB-NACL 420 G PO SOLR
4000.0000 mL | Freq: Once | ORAL | Status: AC
  Administered 2019-08-16: 4000 mL via ORAL
  Filled 2019-08-16: qty 4000

## 2019-08-16 MED ORDER — ALBUTEROL SULFATE (2.5 MG/3ML) 0.083% IN NEBU: 3.0000 mL | INHALATION_SOLUTION | Freq: Four times a day (QID) | RESPIRATORY_TRACT | Status: DC | PRN

## 2019-08-16 MED ORDER — ATENOLOL 25 MG PO TABS
12.5000 mg | ORAL_TABLET | Freq: Two times a day (BID) | ORAL | Status: DC
  Administered 2019-08-16 – 2019-08-18 (×4): 12.5 mg via ORAL
  Filled 2019-08-16 (×6): qty 0.5

## 2019-08-16 MED ORDER — ACETAMINOPHEN 650 MG RE SUPP: 650.0000 mg | Freq: Four times a day (QID) | RECTAL | Status: DC | PRN

## 2019-08-16 NOTE — ED Notes (Signed)
Fluid bolus initiated per MAR. Name/DOB verified with pt. Pt assisted to commode. Pt with large episode of bright red loose stool. Pt became lightheaded and diaphoretic while on commode. Assisted pt back to bed. PA Martinique Robinson notified and at bedside

## 2019-08-16 NOTE — ED Notes (Addendum)
Pt to ED Rm 24 from [place]. Pt c/o BRBPR that began at [facility] today. Pt reports 4 episodes of loose bright red stools. States last episode was "just water." Pt denies light headedness/dizziness. Denies N/V. Reports intermittent L sided abdominal pain and cramping x2 days. Pt has hx of liver cirrhosis. Takes aspirin 81 mg every day.  PIV initiated, 18 G, to R hand. IV flushes with 10 cc NS. Secured with tape and tegaderm. Positive blood return.  PA at bedside

## 2019-08-16 NOTE — Consult Note (Signed)
Reason for Consult: Hematochezia Referring Physician: [facility]  [name] [name] HPI: This is a 74 year old female with a PMH of AIH hepatitis, cirrhosis, diverticulosis, and other medical conditions admitted for hematochezia.  For a couple of days prior to admission she experienced some lower abdominal discomfort.  This morning at 5 AM, she started to have hematochezia. Her abdominal pain with the bleeding was consistent with cramping.  She denied any issues with hematemesis.  The last colonoscopy was in 2012 with Dr. Olevia Perches and it was positive for left sided diverticulosis.  Past Medical History:  Diagnosis Date  . Allergic rhinitis, cause unspecified   . Arthritis    hands, feet and back  . Benign neoplasm of colon   . Complication of anesthesia    " i WAKE UP WITH TREMORS "  . Cyst and pseudocyst of pancreas   . Depressive disorder, not elsewhere classified   . Diverticulitis   . Diverticulosis of colon (without mention of hemorrhage)   . Esophageal reflux   . Essential and other specified forms of tremor   . Irritable bowel syndrome   . Obesity, unspecified   . Other specified cardiac dysrhythmias(427.89)   . Periapical abscess without sinus   . Polyneuropathy in diabetes(357.2)   . Pure hypercholesterolemia   . Sleep apnea    CPAP- non compliant  . Type II or unspecified type diabetes mellitus with neurological manifestations, not stated as uncontrolled(250.60)     Past Surgical History:  Procedure Laterality Date  . ABDOMINAL HYSTERECTOMY    . CALCANEAL OSTEOTOMY Right 07/27/2015   Procedure: RIGHT CALCANEAL OSTEOTOMY ;  Surgeon: Wylene Simmer, MD;  Location: [facility];  Service: Orthopedics;  Laterality: Right;  . CHOLECYSTECTOMY     15-20 years ago  . GASTROCNEMIUS RECESSION Right 07/27/2015   Procedure: RIGHT GASTROC RECESSION;  Surgeon: Wylene Simmer, MD;  Location: [facility];  Service: Orthopedics;  Laterality: Right;  . KIDNEY STONE  SURGERY  1980's   removal  . 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 PARTIAL HYSTERECTOMY  1990's  . ROTATOR CUFF REPAIR  2005    Family History  Problem Relation Age of Onset  . Clotting disorder Mother   . Cirrhosis Father   . Diabetes Other        2 aunts  . Cancer Other        grandmother  . Allergies Other        "whole family"  . Asthma Other        "whole family"    Social History:  reports that she has never smoked. She has never used smokeless tobacco. She reports that she does not drink alcohol or use drugs.  Allergies:  Allergies  Allergen Reactions  . Doxycycline Hives  . Epinephrine Hcl Other (See Comments)    REACTION: Had tremors when it was given.  . Erythromycin Ethylsuccinate Other (See Comments)    REACTION: abd spasms  . Iohexol Other (See Comments)     Code: HIVES, Desc: pt needs premedicated-- hives on prev contrast study per md office   . Oxycodone Other (See Comments)    "felt like I was dying"  . Tape Rash    Medications: Scheduled: Continuous:  Results for orders placed or performed during the hospital encounter of 08/16/19 (from the past 24 hour(s))  Comprehensive metabolic panel     Status: Abnormal   Collection Time: 08/16/19  6:38 AM  Result Value Ref  Range   Sodium 140 135 - 145 mmol/L   Potassium 3.7 3.5 - 5.1 mmol/L   Chloride 106 98 - 111 mmol/L   CO2 22 22 - 32 mmol/L   Glucose, Bld 133 (H) 70 - 99 mg/dL   BUN 19 8 - 23 mg/dL   Creatinine, Ser 0.88 0.44 - 1.00 mg/dL   Calcium 9.7 8.9 - 10.3 mg/dL   Total Protein 6.1 (L) 6.5 - 8.1 g/dL   Albumin 3.2 (L) 3.5 - 5.0 g/dL   AST 36 15 - 41 U/L   ALT 30 0 - 44 U/L   Alkaline Phosphatase 99 38 - 126 U/L   Total Bilirubin 0.8 0.3 - 1.2 mg/dL   GFR calc non Af Amer >60 >60 mL/min   GFR calc Af Amer >60 >60 mL/min   Anion gap 12 5 - 15  CBC     Status: None   Collection Time: 08/16/19  6:38 AM  Result Value Ref Range   WBC 7.4 4.0 - 10.5 K/uL   RBC 4.26 3.87 - 5.11 MIL/uL    Hemoglobin 12.9 12.0 - 15.0 g/dL   HCT 40.0 36.0 - 46.0 %   MCV 93.9 80.0 - 100.0 fL   MCH 30.3 26.0 - 34.0 pg   MCHC 32.3 30.0 - 36.0 g/dL   RDW 13.7 11.5 - 15.5 %   Platelets 167 150 - 400 K/uL   nRBC 0.0 0.0 - 0.2 %  Protime-INR     Status: None   Collection Time: 08/16/19  6:38 AM  Result Value Ref Range   Prothrombin Time 13.2 11.4 - 15.2 seconds   INR 1.0 0.8 - 1.2  Type and screen [place]     Status: None   Collection Time: 08/16/19  6:40 AM  Result Value Ref Range   ABO/RH(D) A POS    Antibody Screen NEG    Sample Expiration      08/19/2019,2359 Performed at [facility], [address], [place], [place] [address]   ABO/Rh     Status: None (Preliminary result)   Collection Time: 08/16/19  6:40 AM  Result Value Ref Range   ABO/RH(D)      A POS Performed at [facility] [address] [place], [place] [address]      No results found.  ROS:  As stated above in the HPI otherwise negative.  Blood pressure (!) 123/51, pulse 78, temperature 97.9 F (36.6 C), temperature source Oral, resp. rate 17, height 5' 4"  (1.626 m), weight 103 kg, SpO2 98 %.    PE: Gen: NAD, Alert and Oriented HEENT:  [name]/AT, EOMI Neck: Supple, no LAD Lungs: CTA Bilaterally CV: RRR without M/G/R ABM: Soft, NTND, +BS Ext: No C/C/E  Assessment/Plan: 1) Diverticular bleed. 2) AIH. 3) Cirrhosis.   At this time it does not appear that she has a variceal bleed, but it is a possibility.  Her clinical presentation is more consistent with a diverticular bleed.  A CT angio was desired, but she has a contrast allergy.  By the time she receives the steroid prophylaxis, even on an emergency basis, there is less of a chance to catch the bleeding.  She will need an EGD/colonoscopy tomorrow.  Plan: 1) Monitor HGB and transfuse as necessary. 2) Start on octreotide. 3) EGD/colonoscopy tomorrow.  [name] [name] D 08/16/2019, 8:57 AM

## 2019-08-16 NOTE — ED Notes (Signed)
Care endorsed to [place], [place]

## 2019-08-16 NOTE — ED Triage Notes (Signed)
The pt has liver disease non-alcohol  abd pain and bloody stools since last pm  Bright red blood from the rectum sl nausea  A and o x4 skin warm and dry

## 2019-08-16 NOTE — ED Provider Notes (Signed)
[facility] EMERGENCY DEPARTMENT Provider Note   CSN: [id number] Arrival date & time: 08/16/19  3838     History Chief Complaint  Patient presents with  . GI Bleeding    [name] [name] is a 74 y.o. female w PMHx autoimmune hepatitis, T2DM, IBS, HLD, diverticulosis, presenting to the emergency department with hematochezia that began this morning.  She states she had a bowel movement around 5 AM today.  She states that had a large amount of bright red blood.  She has had for loose bright red stools since then.  She is also having some left lower quadrant abdominal discomfort and cramping.  No pain in her rectum or hemorrhoids.  She also endorses some nausea.  She has never had GI bleed before.  She takes a baby aspirin daily though no other regular NSAIDs, no anticoagulants.  She does not drink alcohol.  She is feeling very fatigued though reports baseline shortness of breath.   The history is provided by the patient.       Past Medical History:  Diagnosis Date  . Allergic rhinitis, cause unspecified   . Arthritis    hands, feet and back  . Benign neoplasm of colon   . Complication of anesthesia    " i WAKE UP WITH TREMORS "  . Cyst and pseudocyst of pancreas   . Depressive disorder, not elsewhere classified   . Diverticulitis   . Diverticulosis of colon (without mention of hemorrhage)   . Esophageal reflux   . Essential and other specified forms of tremor   . Irritable bowel syndrome   . Obesity, unspecified   . Other specified cardiac dysrhythmias(427.89)   . Periapical abscess without sinus   . Polyneuropathy in diabetes(357.2)   . Pure hypercholesterolemia   . Sleep apnea    CPAP- non compliant  . Type II or unspecified type diabetes mellitus with neurological manifestations, not stated as uncontrolled(250.60)     Patient Active Problem List   Diagnosis Date Noted  . Autoimmune hepatitis ([place]) 12/29/2018  . Chest pain 12/22/2018  . Asthmatic  bronchitis with exacerbation 03/21/2017  . Elevated LFTs 12/24/2016  . SVT (supraventricular tachycardia) ([place]) 12/24/2016  . Peripheral edema 12/24/2016  . S/P foot surgery, right 07/27/2015  . Preoperative clearance 06/29/2015  . Angioedema 08/30/2014  . Recurrent UTI 01/06/2014  . Chronic insomnia 01/06/2014  . Vitamin D deficiency 08/20/2010  . 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 Lung nodule 08/03/2007  . CYST AND PSEUDOCYST OF PANCREAS 08/03/2007  . 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  . DISORDER, LIVER NEC 02/22/2007  . 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 HYPERCHOLESTEROLEMIA 12/30/2006  . OBESITY 12/30/2006  . TREMOR, ESSENTIAL 12/30/2006  . Palpitations 12/30/2006  . Seasonal and perennial allergic rhinitis 12/30/2006  . GERD 12/30/2006  . DIVERTICULOSIS, COLON 12/30/2006  . IBS 12/30/2006  . Obstructive sleep apnea 12/30/2006  . URINARY INCONTINENCE 12/30/2006  . Poorly controlled type 2 diabetes mellitus ([place]) 12/22/2006    Past Surgical History:  Procedure Laterality Date  . ABDOMINAL HYSTERECTOMY    . CALCANEAL OSTEOTOMY Right 07/27/2015   Procedure: RIGHT CALCANEAL OSTEOTOMY ;  Surgeon: Wylene Simmer, MD;  Location: [place];  Service: Orthopedics;  Laterality: Right;  . CHOLECYSTECTOMY     15-20 years ago  . GASTROCNEMIUS RECESSION Right 07/27/2015   Procedure: RIGHT GASTROC RECESSION;  Surgeon: Wylene Simmer, MD;  Location: [place];  Service: Orthopedics;  Laterality: Right;  . KIDNEY STONE SURGERY  1980's   removal  . 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 PARTIAL HYSTERECTOMY  1990's  . ROTATOR CUFF REPAIR  2005     OB History    Gravida  6   Para  4   Term      Preterm      AB  1   Living        SAB      TAB      Ectopic      Multiple      Live Births              Family History  Problem Relation Age of Onset  . Clotting disorder Mother   . Cirrhosis Father   . Diabetes Other         2 aunts  . Cancer Other        grandmother  . Allergies Other        "whole family"  . Asthma Other        "whole family"    Social History   Tobacco Use  . Smoking status: Never Smoker  . Smokeless tobacco: Never Used  Substance Use Topics  . Alcohol use: No    Alcohol/week: 0.0 standard drinks  . Drug use: No    Home Medications Prior to Admission medications   Medication Sig Start Date End Date Taking? Authorizing Provider  albuterol (PROAIR HFA) 108 (90 Base) MCG/ACT inhaler Inhale 2 puffs into the lungs every 6 (six) hours as needed for wheezing or shortness of breath. 12/14/17   Varney Biles, MD  aspirin 81 MG tablet Take 81 mg by mouth daily.    [provider]  atenolol (TENORMIN) 25 MG tablet TAKE 1 TABLET BY MOUTH EVERY DAY 03/01/19   Lorretta Harp, MD  fluticasone [facility]) 50 MCG/ACT nasal spray Place 1 spray into both nostrils daily.    [provider]  furosemide (LASIX) 40 MG tablet Take 40 mg by mouth daily.    [provider]  Insulin Pen Needle (B-D ULTRAFINE III SHORT PEN) 31G X 8 MM MISC Use to inject insulin two times a day.  Dx: E11.9 12/01/14   Bedsole, Amy E, MD  NEXIUM 40 MG capsule TAKE 1 CAPSULE BY MOUTH DAILY 10/28/12   Bedsole, Amy E, MD  NOVOLIN N RELION 100 UNIT/ML injection Inject 50 Units into the skin 3 (three) times daily. 11/20/18   [provider]  NOVOLIN R RELION 100 UNIT/ML injection Inject 10-50 Units into the skin 3 (three) times daily. Sliding scale 11/20/18   [provider]  Respiratory Therapy Supplies (FLUTTER) DEVI Twice a day or as needed 12/22/17   Lauraine Rinne, NP    Allergies    Doxycycline, Epinephrine hcl, Erythromycin ethylsuccinate, Iohexol, and Oxycodone  Review of Systems   Review of Systems  All other systems reviewed and are negative.   Physical Exam Updated Vital Signs BP 111/60   Pulse 88   Temp 97.9 F (36.6 C) (Oral)   Resp 16   Ht 5' 4"  (1.626 m)   Wt 103 kg    SpO2 99%   BMI 38.98 kg/m   Physical Exam Vitals and nursing note reviewed.  Constitutional:      General: She is not in acute distress.    Appearance: She is well-developed. She is obese.  HENT:     Head: Normocephalic and atraumatic.  Eyes:     Conjunctiva/sclera: Conjunctivae normal.  Cardiovascular:     Rate and Rhythm: Regular rhythm. Tachycardia present.  Pulmonary:     Effort: Pulmonary effort is normal. No respiratory distress.     Breath sounds: Normal breath sounds.  Abdominal:     General: Bowel sounds are normal.     Palpations: Abdomen is soft.     Tenderness: There is abdominal tenderness in the periumbilical area and left lower quadrant. There is no guarding or rebound.  Skin:    General: Skin is warm.  Neurological:     Mental Status: She is alert.  Psychiatric:        Behavior: Behavior normal.     ED Results / Procedures / Treatments   Labs (all labs ordered are listed, but only abnormal results are displayed) Labs Reviewed  COMPREHENSIVE METABOLIC PANEL - Abnormal; Notable for the following components:      Result Value   Glucose, Bld 133 (*)    Total Protein 6.1 (*)    Albumin 3.2 (*)    All other components within normal limits  SARS CORONAVIRUS 2 (TAT 6-24 HRS)  CBC  PROTIME-INR  TYPE AND SCREEN  ABO/RH    EKG EKG Interpretation  Date/Time:  Monday August 16 2019 06:44:10 EST Ventricular Rate:  90 PR Interval:    QRS Duration: 83 QT Interval:  356 QTC Calculation: 436 R Axis:   -20 Text Interpretation: Sinus rhythm Borderline left axis deviation slight ST depressions similar to June 2020 Confirmed by Sherwood Gambler [phone]) on 08/16/2019 7:14:52 AM   Radiology No results found.  Procedures Procedures (including critical care time)  Medications Ordered in ED Medications  sodium chloride 0.9 % bolus 1,000 mL (1,000 mLs Intravenous New Bag/Given 08/16/19 0706)  ondansetron (ZOFRAN) injection 4 mg (4 mg Intravenous Given  08/16/19 0739)    ED Course  I have reviewed the triage vital signs and the nursing notes.  Pertinent labs & imaging results that were available during my care of the patient were reviewed by me and considered in my medical decision making (see chart for details).  Clinical Course as of Aug 15 822  Mon Aug 16, 2019  4765 Pt had large grossly bloody stool. Per nursing, she was also diaphoretic and nauseous, lightheaded with bowel movement.    [JR]  [id number] Consulted with Dr. Collene Mares with GI. Requests medical admission. Dr. Benson Norway to evaluate patient.   [JR]  [id number] Dr. Lorin Mercy accepting admission.   [JR]    Clinical Course User Index [JR] Daysha Ashmore, [place] N, PA-C   MDM Rules/Calculators/A&P                      Pt with PMHx autoimmune hepatitis, presenting to the ED with complaint of sudden onset of hematochezia that began this morning at 5am.  Patient has had 4-5 grossly bloody bowel movements since 5 AM and having active large dark red bloody bowel movements in the ED.  On arrival she is slightly tachycardic with low normal blood pressures.  Abdomen is soft with some mild tenderness in the left lower quadrant, no peritoneal signs.  She takes a baby aspirin daily though no other regular NSAID use or alcohol use.  She is followed by Dr. Benson Norway with GI.  Labs today reveal hemoglobin of 12.9, INR of 1, albumin 3.2.  Given patient's active GI bleed, gastroenterology consulted, discussed with Dr. Collene Mares.  Recommends medical admission, Dr. Benson Norway to consult on patient.  Patient discussed with and evaluated by Dr. Regenia Skeeter.  The patient appears reasonably stabilized for admission considering the current resources, flow, and capabilities available in the ED at this time, and I doubt any other [facility] requiring further screening and/or treatment in the ED prior to admission.  Final Clinical Impression(s) / ED Diagnoses Final diagnoses:  Acute GI bleeding    Rx / DC Orders ED Discharge Orders    None         Emillie Chasen, [place] N, PA-C 08/16/19 7915    Sherwood Gambler, MD 08/16/19 [phone]

## 2019-08-16 NOTE — ED Notes (Signed)
Secured pt's IV.  Fluid bolus infusing.  Pt stated she may need to have another bowel movement soon.  This RN offered her a bedpan.  Pt adamant about getting up to bedside commode.  Per Kaelyn RN, pt became diaphoretic and light headed upon standing and was immediately assisted back to bed.

## 2019-08-16 NOTE — Progress Notes (Signed)
NEW ADMISSION NOTE New Admission Note:   Arrival Method: stretcher from ED Mental Orientation: alert and oriented x4  Telemetry: box: 08 Assessment: Completed Skin: dry and intact  IV: RH Pain: 0 Safety Measures: Safety Fall Prevention Plan has been given, discussed and signed Admission: Completed [facility] [place] Orientation: Patient has been orientated to the room, unit and staff.  Family: NA  Orders have been reviewed and implemented. Will continue to monitor the patient. Call light has been placed within reach and bed alarm has been activated.   Baldo Ash, RN

## 2019-08-16 NOTE — H&P (View-Only) (Signed)
Reason for Consult: Hematochezia Referring Physician: [facility]  Maurice March HPI: This is a 74 year old female with a PMH of AIH hepatitis, cirrhosis, diverticulosis, and other medical conditions admitted for hematochezia.  For a couple of days prior to admission she experienced some lower abdominal discomfort.  This morning at 5 AM, she started to have hematochezia. Her abdominal pain with the bleeding was consistent with cramping.  She denied any issues with hematemesis.  The last colonoscopy was in 2012 with Dr. Olevia Perches and it was positive for left sided diverticulosis.  Past Medical History:  Diagnosis Date  . Allergic rhinitis, cause unspecified   . Arthritis    hands, feet and back  . Benign neoplasm of colon   . Complication of anesthesia    " i WAKE UP WITH TREMORS "  . Cyst and pseudocyst of pancreas   . Depressive disorder, not elsewhere classified   . Diverticulitis   . Diverticulosis of colon (without mention of hemorrhage)   . Esophageal reflux   . Essential and other specified forms of tremor   . Irritable bowel syndrome   . Obesity, unspecified   . Other specified cardiac dysrhythmias(427.89)   . Periapical abscess without sinus   . Polyneuropathy in diabetes(357.2)   . Pure hypercholesterolemia   . Sleep apnea    CPAP- non compliant  . Type II or unspecified type diabetes mellitus with neurological manifestations, not stated as uncontrolled(250.60)     Past Surgical History:  Procedure Laterality Date  . ABDOMINAL HYSTERECTOMY    . CALCANEAL OSTEOTOMY Right 07/27/2015   Procedure: RIGHT CALCANEAL OSTEOTOMY ;  Surgeon: Wylene Simmer, MD;  Location: [place];  Service: Orthopedics;  Laterality: Right;  . CHOLECYSTECTOMY     15-20 years ago  . GASTROCNEMIUS RECESSION Right 07/27/2015   Procedure: RIGHT GASTROC RECESSION;  Surgeon: Wylene Simmer, MD;  Location: [place];  Service: Orthopedics;  Laterality: Right;  . KIDNEY STONE  SURGERY  1980's   removal  . 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 PARTIAL HYSTERECTOMY  1990's  . ROTATOR CUFF REPAIR  2005    Family History  Problem Relation Age of Onset  . Clotting disorder Mother   . Cirrhosis Father   . Diabetes Other        2 aunts  . Cancer Other        grandmother  . Allergies Other        "whole family"  . Asthma Other        "whole family"    Social History:  reports that she has never smoked. She has never used smokeless tobacco. She reports that she does not drink alcohol or use drugs.  Allergies:  Allergies  Allergen Reactions  . Doxycycline Hives  . Epinephrine Hcl Other (See Comments)    REACTION: Had tremors when it was given.  . Erythromycin Ethylsuccinate Other (See Comments)    REACTION: abd spasms  . Iohexol Other (See Comments)     Code: HIVES, Desc: pt needs premedicated-- hives on prev contrast study per md office   . Oxycodone Other (See Comments)    "felt like I was dying"  . Tape Rash    Medications: Scheduled: Continuous:  Results for orders placed or performed during the hospital encounter of 08/16/19 (from the past 24 hour(s))  Comprehensive metabolic panel     Status: Abnormal   Collection Time: 08/16/19  6:38 AM  Result Value Ref  Range   Sodium 140 135 - 145 mmol/L   Potassium 3.7 3.5 - 5.1 mmol/L   Chloride 106 98 - 111 mmol/L   CO2 22 22 - 32 mmol/L   Glucose, Bld 133 (H) 70 - 99 mg/dL   BUN 19 8 - 23 mg/dL   Creatinine, Ser 0.88 0.44 - 1.00 mg/dL   Calcium 9.7 8.9 - 10.3 mg/dL   Total Protein 6.1 (L) 6.5 - 8.1 g/dL   Albumin 3.2 (L) 3.5 - 5.0 g/dL   AST 36 15 - 41 U/L   ALT 30 0 - 44 U/L   Alkaline Phosphatase 99 38 - 126 U/L   Total Bilirubin 0.8 0.3 - 1.2 mg/dL   GFR calc non Af Amer >60 >60 mL/min   GFR calc Af Amer >60 >60 mL/min   Anion gap 12 5 - 15  CBC     Status: None   Collection Time: 08/16/19  6:38 AM  Result Value Ref Range   WBC 7.4 4.0 - 10.5 K/uL   RBC 4.26 3.87 - 5.11 MIL/uL    Hemoglobin 12.9 12.0 - 15.0 g/dL   HCT 40.0 36.0 - 46.0 %   MCV 93.9 80.0 - 100.0 fL   MCH 30.3 26.0 - 34.0 pg   MCHC 32.3 30.0 - 36.0 g/dL   RDW 13.7 11.5 - 15.5 %   Platelets 167 150 - 400 K/uL   nRBC 0.0 0.0 - 0.2 %  Protime-INR     Status: None   Collection Time: 08/16/19  6:38 AM  Result Value Ref Range   Prothrombin Time 13.2 11.4 - 15.2 seconds   INR 1.0 0.8 - 1.2  Type and screen [place]     Status: None   Collection Time: 08/16/19  6:40 AM  Result Value Ref Range   ABO/RH(D) A POS    Antibody Screen NEG    Sample Expiration      08/19/2019,2359 Performed at [facility], [address] [place], [place] [address]   ABO/Rh     Status: None (Preliminary result)   Collection Time: 08/16/19  6:40 AM  Result Value Ref Range   ABO/RH(D)      A POS Performed at [facility] [address], [place], [place] [address]      No results found.  ROS:  As stated above in the HPI otherwise negative.  Blood pressure (!) 123/51, pulse 78, temperature 97.9 F (36.6 C), temperature source Oral, resp. rate 17, height 5' 4"  (1.626 m), weight 103 kg, SpO2 98 %.    PE: Gen: NAD, Alert and Oriented HEENT:  [place]/AT, EOMI Neck: Supple, no LAD Lungs: CTA Bilaterally CV: RRR without M/G/R ABM: Soft, NTND, +BS Ext: No C/C/E  Assessment/Plan: 1) Diverticular bleed. 2) AIH. 3) Cirrhosis.   At this time it does not appear that she has a variceal bleed, but it is a possibility.  Her clinical presentation is more consistent with a diverticular bleed.  A CT angio was desired, but she has a contrast allergy.  By the time she receives the steroid prophylaxis, even on an emergency basis, there is less of a chance to catch the bleeding.  She will need an EGD/colonoscopy tomorrow.  Plan: 1) Monitor HGB and transfuse as necessary. 2) Start on octreotide. 3) EGD/colonoscopy tomorrow.  Walker Sitar D 08/16/2019, 8:57 AM

## 2019-08-16 NOTE — H&P (Signed)
History and Physical    [name] [name] DXA:[id number] DOB: [birth year] DOA: 08/16/2019  PCP: Jinny Sanders, MD Consultants:  Benson Norway - GI; Gwenlyn Found - cardiology; Chalmers Cater - endocrinology; Delilah Shan - sports medicine; Young - pulmonology Patient coming from:  Home - lives with daughter and her husband; NOK: Daughter, Demetrios Loll, [phone]; Janice Norrie, [phone]  Chief Complaint: rectal bleeding  HPI: [name] [name] is a 74 y.o. female with medical history significant of OSA, not on CPAP; DM;  HLD; obesity (BMI 39); IBS; tremor; GERD; depression; autoimmune hepatitis; and diverticular disease presenting with rectal bleeding.  She reports pain in upper and lower belly.  She went to the bathroom about 5 AM and started pooping blood.  She has had about 5 total episodes.  She wears a continuous glucose monitor.  She was able to eat and drink without difficulty yesterday.  She has had pain across her waist and along her left side for the last few days.  No h/o GI bleeding in the past.  With recent BMs, she attempted to get out of bed to have a BM and saw stars with her vision closing in on her like she was going to pass out (pre-syncope).  She has autoimmune hepatitis and takes azathioprine; since starting it she has had a diffuse maculopapular rash.   ED Course:  Active GI bleeding without h/o prior.  ASA is only thinner.  Large BM with blood at 0500 with multiple episodes since and ongoing.  Hgb is ok, 12.9.  INR is 1.0.  Mild tachycardia with low normal pressures, stable.  Dr. Benson Norway will see.  Review of Systems: As per HPI; otherwise review of systems reviewed and negative.   Ambulatory Status:  Ambulates without assistance or with a cane  Past Medical History:  Diagnosis Date  . Allergic rhinitis, cause unspecified   . Arthritis    hands, feet and back  . Benign neoplasm of colon   . Complication of anesthesia    " i WAKE UP WITH TREMORS "  . Cyst and pseudocyst of pancreas   . Depressive  disorder, not elsewhere classified   . Diverticulitis   . Diverticulosis of colon (without mention of hemorrhage)   . Esophageal reflux   . Essential and other specified forms of tremor   . Irritable bowel syndrome   . Obesity, unspecified   . Other specified cardiac dysrhythmias(427.89)   . Periapical abscess without sinus   . Polyneuropathy in diabetes(357.2)   . Pure hypercholesterolemia   . Sleep apnea    CPAP- non compliant  . Type II or unspecified type diabetes mellitus with neurological manifestations, not stated as uncontrolled(250.60)     Past Surgical History:  Procedure Laterality Date  . ABDOMINAL HYSTERECTOMY    . CALCANEAL OSTEOTOMY Right 07/27/2015   Procedure: RIGHT CALCANEAL OSTEOTOMY ;  Surgeon: Wylene Simmer, MD;  Location: [place];  Service: Orthopedics;  Laterality: Right;  . CHOLECYSTECTOMY     15-20 years ago  . GASTROCNEMIUS RECESSION Right 07/27/2015   Procedure: RIGHT GASTROC RECESSION;  Surgeon: Wylene Simmer, MD;  Location: [place];  Service: Orthopedics;  Laterality: Right;  . KIDNEY STONE SURGERY  1980's   removal  . 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 PARTIAL HYSTERECTOMY  1990's  . ROTATOR CUFF REPAIR  2005    Social History   Socioeconomic History  . Marital status: Widowed    Spouse name: Not on file  . Number of  children: 5  . Years of education: Not on file  . Highest education level: Not on file  Occupational History    Employer: RETIRED  Tobacco Use  . Smoking status: Never Smoker  . Smokeless tobacco: Never Used  Substance and Sexual Activity  . Alcohol use: No    Alcohol/week: 0.0 standard drinks  . Drug use: No  . Sexual activity: Not on file  Other Topics Concern  . Not on file  Social History Narrative   Husband treated for testicular cancer, 2 cups coffee a day, no exercise   Social Determinants of Health   Financial Resource Strain:   . Difficulty of Paying Living Expenses: Not on  file  Food Insecurity:   . Worried About Charity fundraiser in the Last Year: Not on file  . Ran Out of Food in the Last Year: Not on file  Transportation Needs:   . Lack of Transportation (Medical): Not on file  . Lack of Transportation (Non-Medical): Not on file  Physical Activity:   . Days of Exercise per Week: Not on file  . Minutes of Exercise per Session: Not on file  Stress:   . Feeling of Stress : Not on file  Social Connections:   . Frequency of Communication with Friends and Family: Not on file  . Frequency of Social Gatherings with Friends and Family: Not on file  . Attends Religious Services: Not on file  . Active Member of Clubs or Organizations: Not on file  . Attends Archivist Meetings: Not on file  . Marital Status: Not on file  Intimate Partner Violence:   . Fear of Current or Ex-Partner: Not on file  . Emotionally Abused: Not on file  . Physically Abused: Not on file  . Sexually Abused: Not on file    Allergies  Allergen Reactions  . Doxycycline Hives  . Epinephrine Hcl Other (See Comments)    REACTION: Had tremors when it was given.  . Erythromycin Ethylsuccinate Other (See Comments)    REACTION: abd spasms  . Iohexol Other (See Comments)     Code: HIVES, Desc: pt needs premedicated-- hives on prev contrast study per md office   . Oxycodone Other (See Comments)    "felt like I was dying"  . Tape Rash    Family History  Problem Relation Age of Onset  . Clotting disorder Mother   . Cirrhosis Father   . Diabetes Other        2 aunts  . Cancer Other        grandmother  . Allergies Other        "whole family"  . Asthma Other        "whole family"    Prior to Admission medications   Medication Sig Start Date End Date Taking? Authorizing Provider  albuterol (PROAIR HFA) 108 (90 Base) MCG/ACT inhaler Inhale 2 puffs into the lungs every 6 (six) hours as needed for wheezing or shortness of breath. 12/14/17   Varney Biles, MD  aspirin 81  MG tablet Take 81 mg by mouth daily.    [provider]  atenolol (TENORMIN) 25 MG tablet TAKE 1 TABLET BY MOUTH EVERY DAY 03/01/19   Lorretta Harp, MD  fluticasone [facility]) 50 MCG/ACT nasal spray Place 1 spray into both nostrils daily.    [provider]  furosemide (LASIX) 40 MG tablet Take 40 mg by mouth daily.    [provider]  Insulin Pen Needle (B-D  ULTRAFINE III SHORT PEN) 31G X 8 MM MISC Use to inject insulin two times a day.  Dx: E11.9 12/01/14   Bedsole, Amy E, MD  NEXIUM 40 MG capsule TAKE 1 CAPSULE BY MOUTH DAILY 10/28/12   Bedsole, Amy E, MD  NOVOLIN N RELION 100 UNIT/ML injection Inject 50 Units into the skin 3 (three) times daily. 11/20/18   [provider]  NOVOLIN R RELION 100 UNIT/ML injection Inject 10-50 Units into the skin 3 (three) times daily. Sliding scale 11/20/18   [provider]  Respiratory Therapy Supplies (FLUTTER) DEVI Twice a day or as needed 12/22/17   Lauraine Rinne, NP    Physical Exam: Vitals:   08/16/19 0745 08/16/19 0800 08/16/19 0815 08/16/19 0830  BP: (!) 114/48 (!) 118/46 129/83 (!) 123/51  Pulse: 74 73 85 78  Resp: 14 14 (!) 21 17  Temp:      TempSrc:      SpO2: 100% 97% 100% 98%  Weight:      Height:         . General:  Appears calm and comfortable and is NAD . Eyes:  PERRL, EOMI, normal lids, iris . ENT:  grossly normal hearing, lips & tongue, mmm . Neck:  no LAD, masses or thyromegaly . Cardiovascular:  RRR, no m/r/g. No LE edema.  Marland Kitchen Respiratory:   CTA bilaterally with no wheezes/rales/rhonchi.  Normal respiratory effort. . Abdomen:  soft, ND, NABS, mild diffuse abdominal TTP but particularly along the left colic gutter . Skin:  Maculopapular rash noted particularly on R lower arm and along chest and left neck . Musculoskeletal:  grossly normal tone BUE/BLE, good ROM, no bony abnormality . Psychiatric:  grossly normal mood and affect, speech fluent and appropriate, AOx3 . Neurologic:  CN 2-12  grossly intact, moves all extremities in coordinated fashion, sensation intact    Radiological Exams on Admission: No results found.  EKG: Independently reviewed.  NSR with rate 90; nonspecific ST changes with no evidence of acute ischemia   Labs on Admission: I have personally reviewed the available labs and imaging studies at the time of the admission.  Pertinent labs:   Glucose 133 Albumin 3.2 Normal CBC - Hgb 12.9, 13.7 on 12/22/18 INR 1.0   Assessment/Plan Principal Problem:   Acute lower GI bleeding Active Problems:   Poorly controlled type 2 diabetes mellitus (HCC)   HYPERCHOLESTEROLEMIA   OBESITY   Obstructive sleep apnea   Autoimmune hepatitis (HCC)    Acute lower GI bleeding -Differential to include bleeding hemorrhoids, bacterial infectious colitis with likely pathogen Campylobacter jejuni, diverticulitis (most likely etiology given known prior h/o diverticular disease),and AVM.  -Prior colonoscopy in Epic was in 2008 with moderately severe diverticulosis and multiple polyps; Dr. Benson Norway reports an additional C-scope in 2012 with left-sided diverticulosis. -She is afebrile at this time with no leukocytosis; will not give antibiotics at this time.  -Overnight Observation for now -CBC q6h; transfuse for Hgb <7 -Continue to monitor for recurrent bleeding  -Given the brisk nature of the bleeding (5 episodes in 3-4 hours), will order tagged RBC scan to determine where the bleeding source is -Dr. Benson Norway has been consulted; he recommends starting Octreotide (given her h/o autoimmune hepatitis) and plans to perform EGD/colonoscopy tomorrow -Will give IV BID Protonix although upper source appears less likely at this time -Hold ASA  HTN -Continue Atenolol  Autoimmune hepatitis -Continue Imuran -Rash is likely secondary to medication -Hold Lasix -Dr. Benson Norway has recommended empiric Octreotide -Her MELD/MELD-Na  score is 6, with an estimated mortality risk of 1.9%  DM -She  is NPO for now and so will hold the NPH and cover with resistant-scale SSI -She has a continuous glucose monitor, currently 115; can use her monitor for routine CBGs but will check for correlation once daily  Obesity -BMI 39 -Weight loss should be encouraged  OSA -Does not wear CPAP  HLD -She does not appear to be taking medications for this issue at this time  Allergies/Asthma -Continue Zyrtec (Claritin formulary substitution); Flonase; Albuterol; Mucinex   Note: This patient has been tested and is pending for the novel coronavirus COVID-19.  DVT prophylaxis:  SCDs Code Status:  Full - confirmed with patient Family Communication: None present Disposition Plan:  Home once clinically improved Consults called: GI  Admission status: It is my clinical opinion that referral for OBSERVATION is reasonable and necessary in this patient based on the above information provided. The aforementioned taken together are felt to place the patient at high risk for further clinical deterioration. However it is anticipated that the patient may be medically stable for discharge from the hospital within 24 to 48 hours.     Karmen Bongo MD [facility]   How to contact the [facility] Attending or Consulting provider [place] or covering provider during after hours [place], for this patient?  1. Check the care team in [facility] and look for a) attending/consulting [facility] provider listed and b) the [facility] team listed 2. Log into [email or web address] and use 's universal password to access. If you do not have the password, please contact the hospital operator. 3. Locate the [facility] provider you are looking for under [facility] and page to a number that you can be directly reached. 4. If you still have difficulty reaching the provider, please page the [facility] (Director on Call) for the Hospitalists listed on amion for assistance.   08/16/2019, 8:54 AM

## 2019-08-16 NOTE — Progress Notes (Signed)
Pt refusing bed alarm. RN educated. Call light is with in reach.

## 2019-08-17 ENCOUNTER — Inpatient Hospital Stay (HOSPITAL_COMMUNITY): Payer: Medicare Other | Admitting: Certified Registered"

## 2019-08-17 ENCOUNTER — Encounter (HOSPITAL_COMMUNITY)
Admission: EM | Disposition: A | Discharge status: Home / Self Care | Payer: Self-pay | Source: Home / Self Care | Attending: Family Medicine

## 2019-08-17 ENCOUNTER — Encounter (HOSPITAL_COMMUNITY): Payer: Self-pay | Admitting: Family Medicine

## 2019-08-17 DIAGNOSIS — K3189 Other diseases of stomach and duodenum: Secondary | ICD-10-CM | POA: Diagnosis present

## 2019-08-17 DIAGNOSIS — K5730 Diverticulosis of large intestine without perforation or abscess without bleeding: Secondary | ICD-10-CM | POA: Diagnosis not present

## 2019-08-17 DIAGNOSIS — Z20822 Contact with and (suspected) exposure to covid-19: Secondary | ICD-10-CM | POA: Diagnosis present

## 2019-08-17 DIAGNOSIS — I8510 Secondary esophageal varices without bleeding: Secondary | ICD-10-CM | POA: Diagnosis present

## 2019-08-17 DIAGNOSIS — D123 Benign neoplasm of transverse colon: Secondary | ICD-10-CM | POA: Diagnosis not present

## 2019-08-17 DIAGNOSIS — K5731 Diverticulosis of large intestine without perforation or abscess with bleeding: Secondary | ICD-10-CM | POA: Diagnosis present

## 2019-08-17 DIAGNOSIS — E785 Hyperlipidemia, unspecified: Secondary | ICD-10-CM | POA: Diagnosis present

## 2019-08-17 DIAGNOSIS — Z6839 Body mass index (BMI) 39.0-39.9, adult: Secondary | ICD-10-CM | POA: Diagnosis not present

## 2019-08-17 DIAGNOSIS — Z833 Family history of diabetes mellitus: Secondary | ICD-10-CM | POA: Diagnosis not present

## 2019-08-17 DIAGNOSIS — I8500 Esophageal varices without bleeding: Secondary | ICD-10-CM | POA: Diagnosis present

## 2019-08-17 DIAGNOSIS — J45909 Unspecified asthma, uncomplicated: Secondary | ICD-10-CM | POA: Diagnosis present

## 2019-08-17 DIAGNOSIS — F419 Anxiety disorder, unspecified: Secondary | ICD-10-CM | POA: Diagnosis present

## 2019-08-17 DIAGNOSIS — E7800 Pure hypercholesterolemia, unspecified: Secondary | ICD-10-CM | POA: Diagnosis present

## 2019-08-17 DIAGNOSIS — Z7982 Long term (current) use of aspirin: Secondary | ICD-10-CM | POA: Diagnosis not present

## 2019-08-17 DIAGNOSIS — D649 Anemia, unspecified: Secondary | ICD-10-CM | POA: Diagnosis not present

## 2019-08-17 DIAGNOSIS — Z794 Long term (current) use of insulin: Secondary | ICD-10-CM | POA: Diagnosis not present

## 2019-08-17 DIAGNOSIS — K635 Polyp of colon: Secondary | ICD-10-CM | POA: Diagnosis present

## 2019-08-17 DIAGNOSIS — D62 Acute posthemorrhagic anemia: Secondary | ICD-10-CM | POA: Diagnosis present

## 2019-08-17 DIAGNOSIS — K922 Gastrointestinal hemorrhage, unspecified: Secondary | ICD-10-CM | POA: Diagnosis present

## 2019-08-17 DIAGNOSIS — G4733 Obstructive sleep apnea (adult) (pediatric): Secondary | ICD-10-CM | POA: Diagnosis not present

## 2019-08-17 DIAGNOSIS — E1165 Type 2 diabetes mellitus with hyperglycemia: Secondary | ICD-10-CM | POA: Diagnosis present

## 2019-08-17 DIAGNOSIS — K625 Hemorrhage of anus and rectum: Secondary | ICD-10-CM | POA: Diagnosis not present

## 2019-08-17 DIAGNOSIS — D125 Benign neoplasm of sigmoid colon: Secondary | ICD-10-CM | POA: Diagnosis not present

## 2019-08-17 DIAGNOSIS — E669 Obesity, unspecified: Secondary | ICD-10-CM | POA: Diagnosis present

## 2019-08-17 DIAGNOSIS — Z86010 Personal history of colonic polyps: Secondary | ICD-10-CM | POA: Diagnosis not present

## 2019-08-17 DIAGNOSIS — K219 Gastro-esophageal reflux disease without esophagitis: Secondary | ICD-10-CM | POA: Diagnosis present

## 2019-08-17 DIAGNOSIS — K766 Portal hypertension: Secondary | ICD-10-CM | POA: Diagnosis present

## 2019-08-17 DIAGNOSIS — K754 Autoimmune hepatitis: Secondary | ICD-10-CM | POA: Diagnosis present

## 2019-08-17 DIAGNOSIS — I10 Essential (primary) hypertension: Secondary | ICD-10-CM | POA: Diagnosis present

## 2019-08-17 DIAGNOSIS — K7460 Unspecified cirrhosis of liver: Secondary | ICD-10-CM | POA: Diagnosis present

## 2019-08-17 HISTORY — PX: ESOPHAGOGASTRODUODENOSCOPY (EGD) WITH PROPOFOL: SHX5813

## 2019-08-17 HISTORY — PX: COLONOSCOPY WITH PROPOFOL: SHX5780

## 2019-08-17 HISTORY — PX: POLYPECTOMY: SHX5525

## 2019-08-17 LAB — CBC
HCT: 34.1 % — ABNORMAL LOW (ref 36.0–46.0)
HCT: 29.1 % — ABNORMAL LOW (ref 36.0–46.0)
HCT: 28.4 % — ABNORMAL LOW (ref 36.0–46.0)
Hemoglobin: 10.9 g/dL — ABNORMAL LOW (ref 12.0–15.0)
Hemoglobin: 9.3 g/dL — ABNORMAL LOW (ref 12.0–15.0)
Hemoglobin: 9.3 g/dL — ABNORMAL LOW (ref 12.0–15.0)
MCH: 30.3 pg (ref 26.0–34.0)
MCH: 30.3 pg (ref 26.0–34.0)
MCH: 31 pg (ref 26.0–34.0)
MCHC: 32 g/dL (ref 30.0–36.0)
MCHC: 32 g/dL (ref 30.0–36.0)
MCHC: 32.7 g/dL (ref 30.0–36.0)
MCV: 94.7 fL (ref 80.0–100.0)
MCV: 94.8 fL (ref 80.0–100.0)
MCV: 94.7 fL (ref 80.0–100.0)
Platelets: 161 10*3/uL (ref 150–400)
Platelets: 117 10*3/uL — ABNORMAL LOW (ref 150–400)
Platelets: 123 10*3/uL — ABNORMAL LOW (ref 150–400)
RBC: 3.6 MIL/uL — ABNORMAL LOW (ref 3.87–5.11)
RBC: 3.07 MIL/uL — ABNORMAL LOW (ref 3.87–5.11)
RBC: 3 MIL/uL — ABNORMAL LOW (ref 3.87–5.11)
RDW: 14.1 % (ref 11.5–15.5)
RDW: 14.2 % (ref 11.5–15.5)
RDW: 14.1 % (ref 11.5–15.5)
WBC: 6.3 10*3/uL (ref 4.0–10.5)
WBC: 4.4 10*3/uL (ref 4.0–10.5)
WBC: 4.4 10*3/uL (ref 4.0–10.5)
nRBC: 0 % (ref 0.0–0.2)
nRBC: 0 % (ref 0.0–0.2)
nRBC: 0 % (ref 0.0–0.2)

## 2019-08-17 LAB — GLUCOSE, CAPILLARY
Glucose-Capillary: 133 mg/dL — ABNORMAL HIGH (ref 70–99)
Glucose-Capillary: 100 mg/dL — ABNORMAL HIGH (ref 70–99)
Glucose-Capillary: 137 mg/dL — ABNORMAL HIGH (ref 70–99)
Glucose-Capillary: 155 mg/dL — ABNORMAL HIGH (ref 70–99)

## 2019-08-17 LAB — BASIC METABOLIC PANEL
Anion gap: 8 (ref 5–15)
BUN: 15 mg/dL (ref 8–23)
CO2: 26 mmol/L (ref 22–32)
Calcium: 8.6 mg/dL — ABNORMAL LOW (ref 8.9–10.3)
Chloride: 110 mmol/L (ref 98–111)
Creatinine, Ser: 0.93 mg/dL (ref 0.44–1.00)
GFR calc Af Amer: >60 mL/min (ref >60)
GFR calc non Af Amer: >60 mL/min (ref >60)
Glucose, Bld: 146 mg/dL — ABNORMAL HIGH (ref 70–99)
Potassium: 4.1 mmol/L (ref 3.5–5.1)
Sodium: 144 mmol/L (ref 135–145)

## 2019-08-17 SURGERY — ESOPHAGOGASTRODUODENOSCOPY (EGD) WITH PROPOFOL
Anesthesia: Monitor Anesthesia Care

## 2019-08-17 MED ORDER — PROPOFOL 10 MG/ML IV BOLUS
INTRAVENOUS | Status: DC | PRN
  Administered 2019-08-17 (×2): 25 mg via INTRAVENOUS

## 2019-08-17 MED ORDER — LORAZEPAM 2 MG/ML IJ SOLN
0.5000 mg | Freq: Once | INTRAMUSCULAR | Status: AC
  Administered 2019-08-17: 0.5 mg via INTRAVENOUS
  Filled 2019-08-17: qty 1

## 2019-08-17 MED ORDER — PROPOFOL 500 MG/50ML IV EMUL
INTRAVENOUS | Status: DC | PRN
  Administered 2019-08-17: 100 ug/kg/min via INTRAVENOUS
  Administered 2019-08-17: 75 ug/kg/min via INTRAVENOUS

## 2019-08-17 SURGICAL SUPPLY — 25 items

## 2019-08-17 NOTE — Anesthesia Preprocedure Evaluation (Addendum)
Anesthesia Evaluation  Patient identified by MRN, date of birth, ID band Patient awake    Reviewed: Allergy & Precautions, NPO status , Patient's Chart, lab work & pertinent test results  History of Anesthesia Complications Negative for: history of anesthetic complications  Airway Mallampati: III  TM Distance: >3 FB Neck ROM: Full    Dental  (+) Dental Advisory Given, Missing,    Pulmonary asthma , sleep apnea and Continuous Positive Airway Pressure Ventilation ,    breath sounds clear to auscultation       Cardiovascular hypertension, Pt. on medications and Pt. on home beta blockers (-) angina(-) Past MI and (-) CHF  Rhythm:Regular     Neuro/Psych PSYCHIATRIC DISORDERS Anxiety Depression negative neurological ROS     GI/Hepatic GERD  ,(+) Hepatitis -? GI bleed   Endo/Other  diabetes  Renal/GU      Musculoskeletal  (+) Arthritis ,   Abdominal   Peds  Hematology  (+) anemia ,   Anesthesia Other Findings   Reproductive/Obstetrics                           Anesthesia Physical Anesthesia Plan  ASA: II  Anesthesia Plan: MAC   Post-op Pain Management:    Induction: Intravenous  PONV Risk Score and Plan: 2 and Treatment may vary due to age or medical condition and Propofol infusion  Airway Management Planned: Nasal Cannula  Additional Equipment: None  Intra-op Plan:   Post-operative Plan:   Informed Consent: I have reviewed the patients History and Physical, chart, labs and discussed the procedure including the risks, benefits and alternatives for the proposed anesthesia with the patient or authorized representative who has indicated his/her understanding and acceptance.     Dental advisory given  Plan Discussed with: CRNA and Surgeon  Anesthesia Plan Comments:        Anesthesia Quick Evaluation

## 2019-08-17 NOTE — Progress Notes (Signed)
Pt refusing to finish nulytely. RN educated pt. On call provider Sharlet Salina) made aware. Call light with in reach.

## 2019-08-17 NOTE — Plan of Care (Signed)
  Problem: Activity: Goal: Risk for activity intolerance will decrease Outcome: Progressing   

## 2019-08-17 NOTE — Op Note (Signed)
[facility] Patient Name: [name] [name] Procedure Date : 08/17/2019 MRN: [id number] Attending MD: Carol Ada , MD Date of Birth: [birth year] CSN: [id number] Age: 74 Admit Type: Inpatient Procedure:                Colonoscopy Indications:              Hematochezia Providers:                Carol Ada, MD, Baird Cancer, RN, Lazaro Arms,                            Technician, Corie Chiquito, Technician Imagene Riches,                            CRNA Referring MD:              Medicines:                Propofol per Anesthesia Complications:            No immediate complications. Estimated Blood Loss:     Estimated blood loss: none. Estimated blood loss                            was minimal. Procedure:                Pre-Anesthesia Assessment:                           - Prior to the procedure, a History and Physical                            was performed, and patient medications and                            allergies were reviewed. The patient's tolerance of                            previous anesthesia was also reviewed. The risks                            and benefits of the procedure and the sedation                            options and risks were discussed with the patient.                            All questions were answered, and informed consent                            was obtained. Prior Anticoagulants: The patient has                            taken no previous anticoagulant or antiplatelet                            agents. ASA Grade Assessment: III - A patient  with                            severe systemic disease. After reviewing the risks                            and benefits, the patient was deemed in                            satisfactory condition to undergo the procedure.                           - Sedation was administered by an anesthesia                            professional. Deep sedation was attained.                           After  obtaining informed consent, the colonoscope                            was passed under direct vision. Throughout the                            procedure, the patient's blood pressure, pulse, and                            oxygen saturations were monitored continuously. The                            PCF-H190DL ([id number]) Olympus pediatric colonoscope                            was introduced through the anus and advanced to the                            the cecum, identified by appendiceal orifice and                            ileocecal valve. The colonoscopy was performed                            without difficulty. The patient tolerated the                            procedure well. The quality of the bowel                            preparation was good. The ileocecal valve,                            appendiceal orifice, and rectum were photographed. Scope In: 1:52:45 PM Scope Out: 2:16:27 PM Scope Withdrawal Time: 0 hours 17 minutes 19 seconds  Total Procedure Duration: 0 hours 23 minutes 42 seconds  Findings:  Seven sessile polyps were found in the sigmoid colon, descending colon       and transverse colon. The polyps were 3 to 5 mm in size. These polyps       were removed with a cold snare. Resection and retrieval were complete.      Scattered small and large-mouthed diverticula were found in the entire       colon. Impression:               - Seven 3 to 5 mm polyps in the sigmoid colon, in                            the descending colon and in the transverse colon,                            removed with a cold snare. Resected and retrieved.                           - Diverticulosis in the entire examined colon. Recommendation:           - Return patient to hospital ward for ongoing care.                           - Resume regular diet.                           - Continue present medications.                           - Repeat colonoscopy in 3 years for surveillance.                            - If no further bleeding by tomorrow AM, and her                            HGB is stable, she can be discharged home. Procedure Code(s):        --- Professional ---                           [phone], Colonoscopy, flexible; with removal of                            tumor(s), polyp(s), or other lesion(s) by snare                            technique Diagnosis Code(s):        --- Professional ---                           K63.5, Polyp of colon                           K92.1, Melena (includes Hematochezia)                           K57.30, Diverticulosis of large intestine without  perforation or abscess without bleeding CPT copyright 2019 American Medical Association. All rights reserved. The codes documented in this report are preliminary and upon coder review may  be revised to meet current compliance requirements. Carol Ada, MD Carol Ada, MD 08/17/2019 2:23:27 PM This report has been signed electronically. Number of Addenda: 0

## 2019-08-17 NOTE — Op Note (Signed)
[facility] Patient Name: [name] [name] Procedure Date : 08/17/2019 MRN: [id number] Attending MD: Carol Ada , MD Date of Birth: 04-30-1946 CSN: [id number] Age: 74 Admit Type: Inpatient Procedure:                Upper GI endoscopy Indications:              Hematochezia Providers:                Carol Ada, MD, Baird Cancer, RN, Lazaro Arms,                            Technician, Corie Chiquito, Technician Imagene Riches,                            CRNA Referring MD:              Medicines:                Propofol per Anesthesia Complications:            No immediate complications. Estimated Blood Loss:     Estimated blood loss: none. Procedure:                Pre-Anesthesia Assessment:                           - Prior to the procedure, a History and Physical                            was performed, and patient medications and                            allergies were reviewed. The patient's tolerance of                            previous anesthesia was also reviewed. The risks                            and benefits of the procedure and the sedation                            options and risks were discussed with the patient.                            All questions were answered, and informed consent                            was obtained. Prior Anticoagulants: The patient has                            taken no previous anticoagulant or antiplatelet                            agents. ASA Grade Assessment: III - A patient with                            severe systemic  disease. After reviewing the risks                            and benefits, the patient was deemed in                            satisfactory condition to undergo the procedure.                           - Sedation was administered by an anesthesia                            professional. Deep sedation was attained.                           After obtaining informed consent, the endoscope was                             passed under direct vision. Throughout the                            procedure, the patient's blood pressure, pulse, and                            oxygen saturations were monitored continuously. The                            PCF-H190DL ([id number]) Olympus pediatric colonoscope                            was introduced through the mouth, and advanced to                            the second part of duodenum. The upper GI endoscopy                            was accomplished without difficulty. The patient                            tolerated the procedure well. Scope In: Scope Out: Findings:      Small (< 5 mm) varices were found in the lower third of the esophagus.      Mild portal hypertensive gastropathy was found in the gastric fundus and       in the gastric body. Impression:               - Small (< 5 mm) esophageal varices.                           - Portal hypertensive gastropathy.                           - No specimens collected. Recommendation:           - Return patient to hospital ward for ongoing care.                           -  Resume regular diet.                           - Continue present medications.                           - Return to GI clinic in 2 weeks. Procedure Code(s):        --- Professional ---                           [phone], Esophagogastroduodenoscopy, flexible,                            transoral; diagnostic, including collection of                            specimen(s) by brushing or washing, when performed                            (separate procedure) Diagnosis Code(s):        --- Professional ---                           I85.00, Esophageal varices without bleeding                           K76.6, Portal hypertension                           K31.89, Other diseases of stomach and duodenum                           K92.1, Melena (includes Hematochezia) CPT copyright 2019 American Medical Association. All rights  reserved. The codes documented in this report are preliminary and upon coder review may  be revised to meet current compliance requirements. Carol Ada, MD Carol Ada, MD 08/17/2019 2:25:49 PM This report has been signed electronically. Number of Addenda: 0

## 2019-08-17 NOTE — Interval H&P Note (Signed)
History and Physical Interval Note:  08/17/2019 1:03 PM  [name] [name]  has presented today for surgery, with the diagnosis of GI bleed.  The various methods of treatment have been discussed with the patient and family. After consideration of risks, benefits and other options for treatment, the patient has consented to  Procedure(s): ESOPHAGOGASTRODUODENOSCOPY (EGD) WITH PROPOFOL (N/A) COLONOSCOPY WITH PROPOFOL (N/A) as a surgical intervention.  The patient's history has been reviewed, patient examined, no change in status, stable for surgery.  I have reviewed the patient's chart and labs.  Questions were answered to the patient's satisfaction.     Keywon Mestre D

## 2019-08-17 NOTE — Anesthesia Procedure Notes (Signed)
Procedure Name: MAC Date/Time: 08/17/2019 1:51 PM Performed by: Imagene Riches, CRNA Pre-anesthesia Checklist: Patient identified, Emergency Drugs available, Suction available, Patient being monitored and Timeout performed Patient Re-evaluated:Patient Re-evaluated prior to induction Oxygen Delivery Method: Nasal cannula

## 2019-08-17 NOTE — Transfer of Care (Signed)
Immediate Anesthesia Transfer of Care Note  Patient: [name] [name]  Procedure(s) Performed: ESOPHAGOGASTRODUODENOSCOPY (EGD) WITH PROPOFOL (N/A ) COLONOSCOPY WITH PROPOFOL (N/A ) POLYPECTOMY  Patient Location: Endoscopy Unit  Anesthesia Type:MAC  Level of Consciousness: sedated  Airway & Oxygen Therapy: Patient Spontanous Breathing and Patient connected to nasal cannula oxygen  Post-op Assessment: Report given to RN and Post -op Vital signs reviewed and stable  Post vital signs: Reviewed and stable  Last Vitals:  Vitals Value Taken Time  BP 103/42 08/17/19 1424  Temp    Pulse 58 08/17/19 1424  Resp 20 08/17/19 1424  SpO2 99 % 08/17/19 1424  Vitals shown include unvalidated device data.  Last Pain:  Vitals:   08/17/19 1235  TempSrc: Temporal  PainSc: 0-No pain      Patients Stated Pain Goal: 1 (03/79/55 8316)  Complications: No apparent anesthesia complications

## 2019-08-17 NOTE — Progress Notes (Signed)
[facility] PROGRESS NOTE  [name] [name] GQB:[id number] DOB: [birth year] DOA: 08/16/2019 PCP: Jinny Sanders, MD  Assessment/Plan:  #1.  Lower GI bleed/hematochezia.  Patient with a history of diverticulosis.  Tagged study yesterday reveals no active site for GI bleed.  Evaluated by gastroenterology who opine likely diverticular bleed but variceal bleed could not be ruled out.  Recommended EGD/colonoscopy today.  Patient refused most of the prep.  She had 1 or 2 bowel movements that remain bloody.  Endoscopy lab plans to evaluate patient's next bowel movement to determine if prep sufficient for proceeding -Encourage bowel prep -Monitor intake and output -Serial CBCs -Continue Protonix -Remains n.p.o. for now  2.  Anemia.  Normocytic.  Likely related to above.  Hemoglobin trending down slowly.  Chart review indicates hemoglobin 14 in June 2020.  This morning hemoglobin 9.3 down from 12.9 yesterday morning.  No tachycardia or tachypnea.  Blood pressure stable -Serial CBCs -Transfuse as indicated -Monitor closely  #3.  Anxiety.  Patient with a history of depressive disorder.  This morning she is quite anxious.  She reports confusion demonstrates slight irritability becoming noncooperative with care.  Unable to specifically articulate her concerns.  States "my whole life is a mess".  Family reports this is a typical response when she gets Ativan or pain medicine.  She received Ativan at 6 AM.  -Hold benzos and opiates -Monitor -See #1  #4.  Hypertension. controlled.  Home medications include atenolol. -Continue atenolol -Monitor  #5.  Diabetes.  Hemoglobin A1c 7.  Home medications include insulin. -Sliding scale for optimal control -Monitor closely while n.p.o.  #6.  Autoimmune hepatitis.  Home medications include Imuran and Lasix.  Currently LFTs within the limits of normal. -Continue Imuran -Hold Lasix -Octreotide per GI  #7.  Obstructive sleep apnea.  Denies wearing a mask at  home  #8.  Obesity.  BMI 39  Code Status: full Family Communication: patient Disposition Plan: home when ready   Consultants:  [facility]  Procedures:    Antibiotics:    HPI/Subjective: Patient reports feeling "very out-of-control and anxious".  She denies pain or discomfort.  Unable to articulate any specific questions but appears generally unhappy with "everything that is going on".  Patient is a 74 year old with a history of autoimmune hepatitis, diverticulosis, diabetes noted for GI bleed.  Reported 2 to 3-day history of hematochezia with abdominal cramping.  Last colonoscopy 2012+ for diverticulosis.  Tag study done that was negative.  Evaluated by gastroenterology who recommended EGD/colonoscopy today.  Patient was given Ativan early this morning and became slightly agitated/irritable refusing to complete prep and she wants to go home and does not understand "all that is going on".  Family confirms patient has a history of responding this way to pain medicine and anxiety medicine  Objective: Vitals:   08/16/19 1700 08/16/19 2137  BP: 132/60 (!) 155/52  Pulse: 79 70  Resp: 18 16  Temp:  98.1 F (36.7 C)  SpO2: 98% 95%    Intake/Output Summary (Last 24 hours) at 08/17/2019 0903 Last data filed at 08/17/2019 0601 Gross per 24 hour  Intake 2049.56 ml  Output 700 ml  Net 1349.56 ml   Filed Weights   08/16/19 0641  Weight: 103 kg    Exam:   General: Obese awake alert slightly anxious slightly irritable no acute distress  Cardiovascular: Regular rate and rhythm no murmur gallop or rub no lower extremity edema  Respiratory: Normal effort breath sounds are clear bilaterally no wheeze no  rhonchi  Abdomen: Obese soft positive bowel sounds throughout, nontender to palpation  Musculoskeletal: Joints without swelling/erythema  Data Reviewed: Basic Metabolic Panel: Recent Labs  Lab 08/16/19 0638 08/17/19 0620  NA 140 144  K 3.7 4.1  CL 106 110  CO2 22 26   GLUCOSE 133* 146*  BUN 19 15  CREATININE 0.88 0.93  CALCIUM 9.7 8.6*   Liver Function Tests: Recent Labs  Lab 08/16/19 0638  AST 36  ALT 30  ALKPHOS 99  BILITOT 0.8  PROT 6.1*  ALBUMIN 3.2*   No results for input(s): LIPASE, AMYLASE in the last 168 hours. No results for input(s): AMMONIA in the last 168 hours. CBC: Recent Labs  Lab 08/16/19 0638 08/16/19 1223 08/16/19 1859 08/17/19 0059 08/17/19 0620  WBC 7.4 5.4 4.9 6.3 4.4  HGB 12.9 10.6* 10.2* 10.9* 9.3*  HCT 40.0 32.8* 31.4* 34.1* 29.1*  MCV 93.9 94.0 94.3 94.7 94.8  PLT 167 130* 123* 161 117*   Cardiac Enzymes: No results for input(s): CKTOTAL, CKMB, CKMBINDEX, TROPONINI in the last 168 hours. BNP (last 3 results) No results for input(s): BNP in the last 8760 hours.  ProBNP (last 3 results) No results for input(s): PROBNP in the last 8760 hours.  CBG: Recent Labs  Lab 08/16/19 1123 08/16/19 1626 08/16/19 2136 08/17/19 0651  GLUCAP 84 79 91 133*    Recent Results (from the past 240 hour(s))  Respiratory Panel by RT PCR (Flu A&B, Covid) - Nasopharyngeal Swab     Status: None   Collection Time: 08/16/19  8:50 AM   Specimen: Nasopharyngeal Swab  Result Value Ref Range Status   SARS Coronavirus 2 by RT PCR NEGATIVE NEGATIVE Final    Comment: (NOTE) SARS-CoV-2 target nucleic acids are NOT DETECTED. The SARS-CoV-2 RNA is generally detectable in upper respiratoy specimens during the acute phase of infection. The lowest concentration of SARS-CoV-2 viral copies this assay can detect is 131 copies/mL. A negative result does not preclude SARS-Cov-2 infection and should not be used as the sole basis for treatment or other patient management decisions. A negative result may occur with  improper specimen collection/handling, submission of specimen other than nasopharyngeal swab, presence of viral mutation(s) within the areas targeted by this assay, and inadequate number of viral copies (<131 copies/mL). A  negative result must be combined with clinical observations, patient history, and epidemiological information. The expected result is Negative. Fact Sheet for Patients:  PinkCheek.be Fact Sheet for Healthcare Providers:  GravelBags.it This test is not yet ap proved or cleared by the [place] FDA and  has been authorized for detection and/or diagnosis of SARS-CoV-2 by FDA under an Emergency Use Authorization (EUA). This EUA will remain  in effect (meaning this test can be used) for the duration of the COVID-19 declaration under Section 564(b)(1) of the Act, 21 U.S.C. section 360bbb-3(b)(1), unless the authorization is terminated or revoked sooner.    Influenza A by PCR NEGATIVE NEGATIVE Final   Influenza B by PCR NEGATIVE NEGATIVE Final    Comment: (NOTE) The Xpert Xpress SARS-CoV-2/FLU/RSV assay is intended as an aid in  the diagnosis of influenza from Nasopharyngeal swab specimens and  should not be used as a sole basis for treatment. Nasal washings and  aspirates are unacceptable for Xpert Xpress SARS-CoV-2/FLU/RSV  testing. Fact Sheet for Patients: PinkCheek.be Fact Sheet for Healthcare Providers: GravelBags.it This test is not yet approved or cleared by the [place] FDA and  has been authorized for detection and/or diagnosis of SARS-CoV-2  by  FDA under an Emergency Use Authorization (EUA). This EUA will remain  in effect (meaning this test can be used) for the duration of the  Covid-19 declaration under Section 564(b)(1) of the Act, 21  U.S.C. section 360bbb-3(b)(1), unless the authorization is  terminated or revoked. Performed at [facility], [place] [address], [place],  [address]      Studies: NM GI Blood Loss  Result Date: 08/16/2019 CLINICAL DATA:  Rectal bleeding. EXAM: NUCLEAR MEDICINE GASTROINTESTINAL BLEEDING SCAN TECHNIQUE:  Sequential abdominal images were obtained following intravenous administration of Tc-29mlabeled red blood cells. RADIOPHARMACEUTICALS:  20.9 mCi Tc-979mertechnetate in-vitro labeled red cells. COMPARISON:  None. FINDINGS: No active site for GI bleed is identified. IMPRESSION: Negative nuclear medicine GI bleed study. Electronically Signed   By: P.Marijo Sanes.D.   On: 08/16/2019 16:36    Scheduled Meds: . atenolol  12.5 mg Oral BID  . azaTHIOprine  50 mg Oral Daily  . fluticasone  1 spray Each Nare Daily  . insulin aspart  0-20 Units Subcutaneous TID WC  . insulin aspart  0-5 Units Subcutaneous QHS  . loratadine  10 mg Oral Daily  . pantoprazole (PROTONIX) IV  40 mg Intravenous Q12H   Continuous Infusions: . lactated ringers 100 mL/hr at 08/16/19 2238  . octreotide  (SANDOSTATIN)    IV infusion 50 mcg/hr (08/16/19 2209)    Principal Problem:   Acute lower GI bleeding Active Problems:   Anemia   Poorly controlled type 2 diabetes mellitus (HCC)   [place]  Hypertension   Anxiety   HYPERCHOLESTEROLEMIA   OBESITY   Obstructive sleep apnea    Time spent: 45 minutes    BLDouglass HillsP [facility]  If 7PM-7AM, please contact night-coverage at [email or web address], password [email or web address]/26/2021, 9:03 AM  LOS: 0 days

## 2019-08-18 ENCOUNTER — Encounter: Payer: Self-pay | Admitting: *Deleted

## 2019-08-18 DIAGNOSIS — K922 Gastrointestinal hemorrhage, unspecified: Secondary | ICD-10-CM

## 2019-08-18 LAB — HEPATIC FUNCTION PANEL
ALT: 27 U/L (ref 0–44)
AST: 39 U/L (ref 15–41)
Albumin: 2.9 g/dL — ABNORMAL LOW (ref 3.5–5.0)
Alkaline Phosphatase: 78 U/L (ref 38–126)
Bilirubin, Direct: 0.2 mg/dL (ref 0.0–0.2)
Indirect Bilirubin: 0.7 mg/dL (ref 0.3–0.9)
Total Bilirubin: 0.9 mg/dL (ref 0.3–1.2)
Total Protein: 5.5 g/dL — ABNORMAL LOW (ref 6.5–8.1)

## 2019-08-18 LAB — BASIC METABOLIC PANEL
Anion gap: 10 (ref 5–15)
BUN: 13 mg/dL (ref 8–23)
CO2: 22 mmol/L (ref 22–32)
Calcium: 8.9 mg/dL (ref 8.9–10.3)
Chloride: 106 mmol/L (ref 98–111)
Creatinine, Ser: 1.1 mg/dL — ABNORMAL HIGH (ref 0.44–1.00)
GFR calc Af Amer: 58 mL/min — ABNORMAL LOW (ref >60)
GFR calc non Af Amer: 50 mL/min — ABNORMAL LOW (ref >60)
Glucose, Bld: 347 mg/dL — ABNORMAL HIGH (ref 70–99)
Potassium: 4.3 mmol/L (ref 3.5–5.1)
Sodium: 138 mmol/L (ref 135–145)

## 2019-08-18 LAB — CBC
HCT: 31.3 % — ABNORMAL LOW (ref 36.0–46.0)
Hemoglobin: 10 g/dL — ABNORMAL LOW (ref 12.0–15.0)
MCH: 30.7 pg (ref 26.0–34.0)
MCHC: 31.9 g/dL (ref 30.0–36.0)
MCV: 96 fL (ref 80.0–100.0)
Platelets: 150 10*3/uL (ref 150–400)
RBC: 3.26 MIL/uL — ABNORMAL LOW (ref 3.87–5.11)
RDW: 13.9 % (ref 11.5–15.5)
WBC: 5.8 10*3/uL (ref 4.0–10.5)
nRBC: 0 % (ref 0.0–0.2)

## 2019-08-18 LAB — GLUCOSE, CAPILLARY
Glucose-Capillary: 148 mg/dL — ABNORMAL HIGH (ref 70–99)
Glucose-Capillary: 217 mg/dL — ABNORMAL HIGH (ref 70–99)

## 2019-08-18 MED ORDER — PANTOPRAZOLE SODIUM 40 MG PO TBEC: 40.0000 mg | DELAYED_RELEASE_TABLET | Freq: Every day | ORAL | 0 refills | Status: DC

## 2019-08-18 NOTE — Discharge Instructions (Signed)
You were cared for by a hospitalist during your hospital stay. If you have any questions about your discharge medications or the care you received while you were in the hospital after you are discharged, you can call the unit and ask to speak with the hospitalist on call if the hospitalist that took care of you is not available. Once you are discharged, your primary care physician will handle any further medical issues. Please note that NO REFILLS for any discharge medications will be authorized once you are discharged, as it is imperative that you return to your primary care physician (or establish a relationship with a primary care physician if you do not have one) for your aftercare needs so that they can reassess your need for medications and monitor your lab values.  Call Dr. Carol Ada (Gastroenterology) [phone] [address] [place], [place], [address]

## 2019-08-18 NOTE — Discharge Summary (Signed)
Physician Discharge Summary  [name] [name] DUK:[id number] DOB: Jun 02, 1946 DOA: 08/16/2019  PCP: Jinny Sanders, MD  Admit date: 08/16/2019 Discharge date: 08/18/2019  Admitted From: Home Disposition:  Home  Recommendations for Outpatient Follow-up:  1. Follow up with PCP in 1 week 2. Follow up with Dr Benny Lennert in 2 weeks 3. Please obtain BMP/CBC in 1 week 4. Please follow up on the following pending results: 1/26 surg path from scopes 5. 3-year follow-up for colon cancer screening recommended to follow-up on polyps   Discharge Condition: Good CODE STATUS: Full  Diet recommendation: Diabetes  Brief/Interim Summary: Patient admitted on 08/16/2019 for rectal bleeding.  GI was consulted, scope performed showing some small varices and polyps.  Tagged RBC scan showed no active bleed.  Hemoglobin levels remained stable.  She did have several bloody bowel movements while in the hospital.  Subjective on day of discharge: Patient reports feeling very good.  She is a bowel movement since her scopes and it was not bloody.  Hemoglobin at 10.0 up from 9.3 yesterday.  Discharge Diagnoses:  Principal Problem:   Acute lower GI bleeding Active Problems:   Poorly controlled type 2 diabetes mellitus (HCC)   HYPERCHOLESTEROLEMIA   OBESITY   Obstructive sleep apnea   Autoimmune hepatitis ([place])   Anemia   Hypertension   Anxiety   Lower GI bleeding   Discharge Instructions  Discharge Instructions    Call MD for:  difficulty breathing, headache or visual disturbances   Complete by: As directed    Call MD for:  extreme fatigue   Complete by: As directed    Call MD for:  persistant dizziness or light-headedness   Complete by: As directed    Diet - low sodium heart healthy   Complete by: As directed    Increase activity slowly   Complete by: As directed      Allergies as of 08/18/2019      Reactions   Doxycycline Hives   Epinephrine Hcl Other (See Comments)   REACTION: Had tremors when it was  given.   Erythromycin Ethylsuccinate Other (See Comments)   REACTION: abd spasms   Iohexol Other (See Comments)    Code: HIVES, Desc: pt needs premedicated-- hives on prev contrast study per md office   Oxycodone Other (See Comments)   "felt like I was dying"   Tape Rash      Medication List    STOP taking these medications   aspirin 81 MG tablet   NexIUM 40 MG capsule Generic drug: esomeprazole     TAKE these medications   albuterol 108 (90 Base) MCG/ACT inhaler Commonly known as: ProAir HFA Inhale 2 puffs into the lungs every 6 (six) hours as needed for wheezing or shortness of breath.   atenolol 25 MG tablet Commonly known as: TENORMIN TAKE 1 TABLET BY MOUTH EVERY DAY What changed:   how much to take  when to take this   azaTHIOprine 50 MG tablet Commonly known as: IMURAN Take 50 mg by mouth daily.   cetirizine 10 MG tablet Commonly known as: ZYRTEC Take 10 mg by mouth at bedtime.   fluticasone 50 MCG/ACT nasal spray Commonly known as: FLONASE Place 1 spray into both nostrils daily.   Flutter Devi Twice a day or as needed   furosemide 40 MG tablet Commonly known as: LASIX Take 40 mg by mouth daily.   guaiFENesin 600 MG 12 hr tablet Commonly known as: MUCINEX Take 600 mg by mouth 2 (two) times  daily as needed for cough or to loosen phlegm.   Insulin Pen Needle 31G X 8 MM Misc Commonly known as: B-D ULTRAFINE III SHORT PEN Use to inject insulin two times a day.  Dx: E11.9   NovoLIN N ReliOn 100 UNIT/ML injection Generic drug: insulin NPH Human Inject 35 Units into the skin 2 (two) times daily before a meal.   NovoLIN R ReliOn 100 units/mL injection Generic drug: insulin regular Inject 10-50 Units into the skin 3 (three) times daily. Sliding scale   pantoprazole 40 MG tablet Commonly known as: Protonix Take 1 tablet (40 mg total) by mouth daily.       Allergies  Allergen Reactions  . Doxycycline Hives  . Epinephrine Hcl Other (See  Comments)    REACTION: Had tremors when it was given.  . Erythromycin Ethylsuccinate Other (See Comments)    REACTION: abd spasms  . Iohexol Other (See Comments)     Code: HIVES, Desc: pt needs premedicated-- hives on prev contrast study per md office   . Oxycodone Other (See Comments)    "felt like I was dying"  . Tape Rash    Consultations:  Gastroenterology   Procedures/Studies: NM GI Blood Loss  Result Date: 08/16/2019 CLINICAL DATA:  Rectal bleeding. EXAM: NUCLEAR MEDICINE GASTROINTESTINAL BLEEDING SCAN TECHNIQUE: Sequential abdominal images were obtained following intravenous administration of Tc-51mlabeled red blood cells. RADIOPHARMACEUTICALS:  20.9 mCi Tc-926mertechnetate in-vitro labeled red cells. COMPARISON:  None. FINDINGS: No active site for GI bleed is identified. IMPRESSION: Negative nuclear medicine GI bleed study. Electronically Signed   By: P.Marijo Sanes.D.   On: 08/16/2019 16:36       Discharge Exam: Vitals:   08/18/19 0905 08/18/19 0919  BP: (!) 128/51 (!) 141/54  Pulse: 66 63  Resp: 18 18  Temp: 98.3 F (36.8 C) 99.2 F (37.3 C)  SpO2: 94% 95%     General: Pt is alert, awake, not in acute distress Cardiovascular: RRR, S1/S2 +, no edema Respiratory: CTA bilaterally, no wheezing, no rhonchi, no respiratory distress, no conversational dyspnea  Abdominal: Soft, NT, ND, bowel sounds + Extremities: no edema, no cyanosis Psych: Normal mood and affect, stable judgement and insight     The results of significant diagnostics from this hospitalization (including imaging, microbiology, ancillary and laboratory) are listed below for reference.     Microbiology: Recent Results (from the past 240 hour(s))  Respiratory Panel by RT PCR (Flu A&B, Covid) - Nasopharyngeal Swab     Status: None   Collection Time: 08/16/19  8:50 AM   Specimen: Nasopharyngeal Swab  Result Value Ref Range Status   SARS Coronavirus 2 by RT PCR NEGATIVE NEGATIVE Final     Comment: (NOTE) SARS-CoV-2 target nucleic acids are NOT DETECTED. The SARS-CoV-2 RNA is generally detectable in upper respiratoy specimens during the acute phase of infection. The lowest concentration of SARS-CoV-2 viral copies this assay can detect is 131 copies/mL. A negative result does not preclude SARS-Cov-2 infection and should not be used as the sole basis for treatment or other patient management decisions. A negative result may occur with  improper specimen collection/handling, submission of specimen other than nasopharyngeal swab, presence of viral mutation(s) within the areas targeted by this assay, and inadequate number of viral copies (<131 copies/mL). A negative result must be combined with clinical observations, patient history, and epidemiological information. The expected result is Negative. Fact Sheet for Patients:  htPinkCheek.beact Sheet for Healthcare Providers:  htGravelBags.ithis test is not  yet ap proved or cleared by the [place] and  has been authorized for detection and/or diagnosis of SARS-CoV-2 by FDA under an Emergency Use Authorization (EUA). This EUA will remain  in effect (meaning this test can be used) for the duration of the COVID-19 declaration under Section 564(b)(1) of the Act, 21 U.S.C. section 360bbb-3(b)(1), unless the authorization is terminated or revoked sooner.    Influenza A by PCR NEGATIVE NEGATIVE Final   Influenza B by PCR NEGATIVE NEGATIVE Final    Comment: (NOTE) The Xpert Xpress SARS-CoV-2/FLU/RSV assay is intended as an aid in  the diagnosis of influenza from Nasopharyngeal swab specimens and  should not be used as a sole basis for treatment. Nasal washings and  aspirates are unacceptable for Xpert Xpress SARS-CoV-2/FLU/RSV  testing. Fact Sheet for Patients: PinkCheek.be Fact Sheet for Healthcare  Providers: GravelBags.it This test is not yet approved or cleared by the [place] FDA and  has been authorized for detection and/or diagnosis of SARS-CoV-2 by  FDA under an Emergency Use Authorization (EUA). This EUA will remain  in effect (meaning this test can be used) for the duration of the  Covid-19 declaration under Section 564(b)(1) of the Act, 21  U.S.C. section 360bbb-3(b)(1), unless the authorization is  terminated or revoked. Performed at [facility], [place] [address], [place], [place] [address]      Labs: BNP (last 3 results) No results for input(s): BNP in the last 8760 hours. Basic Metabolic Panel: Recent Labs  Lab 08/16/19 0638 08/17/19 0620 08/18/19 0832  NA 140 144 138  K 3.7 4.1 4.3  CL 106 110 106  CO2 22 26 22   GLUCOSE 133* 146* 347*  BUN 19 15 13   CREATININE 0.88 0.93 1.10*  CALCIUM 9.7 8.6* 8.9   Liver Function Tests: Recent Labs  Lab 08/16/19 0638 08/18/19 0832  AST 36 39  ALT 30 27  ALKPHOS 99 78  BILITOT 0.8 0.9  PROT 6.1* 5.5*  ALBUMIN 3.2* 2.9*   CBC: Recent Labs  Lab 08/16/19 1859 08/17/19 0059 08/17/19 0620 08/17/19 1207 08/18/19 0832  WBC 4.9 6.3 4.4 4.4 5.8  HGB 10.2* 10.9* 9.3* 9.3* 10.0*  HCT 31.4* 34.1* 29.1* 28.4* 31.3*  MCV 94.3 94.7 94.8 94.7 96.0  PLT 123* 161 117* 123* 150   CBG: Recent Labs  Lab 08/17/19 1126 08/17/19 1614 08/17/19 2125 08/18/19 0657 08/18/19 1128  GLUCAP 100* 137* 155* 148* 217*   Hgb A1c Recent Labs    08/16/19 0638  HGBA1C 7.0*   Urinalysis    Component Value Date/Time   COLORURINE yellow 03/28/2010 0931   APPEARANCEUR Hazy 03/28/2010 0931   LABSPEC 1.010 03/28/2010 0931   PHURINE 6.0 03/28/2010 0931   HGBUR moderate 03/28/2010 0931   BILIRUBINUR neg 01/06/2014 1230   PROTEINUR neg 01/06/2014 1230   UROBILINOGEN negative 01/06/2014 1230   UROBILINOGEN 0.2 03/28/2010 0931   NITRITE neg 01/06/2014 1230   NITRITE positive 03/28/2010 0931    LEUKOCYTESUR Negative 01/06/2014 1230  ] Microbiology Recent Results (from the past 240 hour(s))  Respiratory Panel by RT PCR (Flu A&B, Covid) - Nasopharyngeal Swab     Status: None   Collection Time: 08/16/19  8:50 AM   Specimen: Nasopharyngeal Swab  Result Value Ref Range Status   SARS Coronavirus 2 by RT PCR NEGATIVE NEGATIVE Final    Comment: (NOTE) SARS-CoV-2 target nucleic acids are NOT DETECTED. The SARS-CoV-2 RNA is generally detectable in upper respiratoy specimens during the acute phase  of infection. The lowest concentration of SARS-CoV-2 viral copies this assay can detect is 131 copies/mL. A negative result does not preclude SARS-Cov-2 infection and should not be used as the sole basis for treatment or other patient management decisions. A negative result may occur with  improper specimen collection/handling, submission of specimen other than nasopharyngeal swab, presence of viral mutation(s) within the areas targeted by this assay, and inadequate number of viral copies (<131 copies/mL). A negative result must be combined with clinical observations, patient history, and epidemiological information. The expected result is Negative. Fact Sheet for Patients:  PinkCheek.be Fact Sheet for Healthcare Providers:  GravelBags.it This test is not yet ap proved or cleared by the [place] FDA and  has been authorized for detection and/or diagnosis of SARS-CoV-2 by FDA under an Emergency Use Authorization (EUA). This EUA will remain  in effect (meaning this test can be used) for the duration of the COVID-19 declaration under Section 564(b)(1) of the Act, 21 U.S.C. section 360bbb-3(b)(1), unless the authorization is terminated or revoked sooner.    Influenza A by PCR NEGATIVE NEGATIVE Final   Influenza B by PCR NEGATIVE NEGATIVE Final    Comment: (NOTE) The Xpert Xpress SARS-CoV-2/FLU/RSV assay is intended as an aid  in  the diagnosis of influenza from Nasopharyngeal swab specimens and  should not be used as a sole basis for treatment. Nasal washings and  aspirates are unacceptable for Xpert Xpress SARS-CoV-2/FLU/RSV  testing. Fact Sheet for Patients: PinkCheek.be Fact Sheet for Healthcare Providers: GravelBags.it This test is not yet approved or cleared by the [place] FDA and  has been authorized for detection and/or diagnosis of SARS-CoV-2 by  FDA under an Emergency Use Authorization (EUA). This EUA will remain  in effect (meaning this test can be used) for the duration of the  Covid-19 declaration under Section 564(b)(1) of the Act, 21  U.S.C. section 360bbb-3(b)(1), unless the authorization is  terminated or revoked. Performed at [facility], [place] [address] [place], [place] [address]      Patient was seen and examined on the day of discharge and was found to be in stable condition. Time coordinating discharge: 40 minutes including assessment and coordination of care, as well as examination of the patient.   SIGNED:  Shelda Pal, DO [facility] 08/18/2019, 1:09 PM

## 2019-08-18 NOTE — Plan of Care (Signed)
  Problem: Education: Goal: Knowledge of General Education information will improve Description Including pain rating scale, medication(s)/side effects and non-pharmacologic comfort measures Outcome: Progressing   

## 2019-08-18 NOTE — Progress Notes (Signed)
DISCHARGE NOTE HOME [name] [name] to be discharged Home per MD order. Discussed prescriptions and follow up appointments with the patient. Prescriptions given to patient; medication list explained in detail. Patient verbalized understanding.  Skin clean, dry and intact without evidence of skin break down, no evidence of skin tears noted. IV catheter discontinued intact. Site without signs and symptoms of complications. Dressing and pressure applied. Pt denies pain at the site currently. No complaints noted.  Patient free of lines, drains, and wounds.   An After Visit Summary (AVS) was printed and given to the patient. Patient escorted via wheelchair, and discharged home via private auto.  [name], RN

## 2019-08-19 ENCOUNTER — Telehealth: Payer: Self-pay

## 2019-08-19 ENCOUNTER — Other Ambulatory Visit: Payer: Self-pay | Admitting: Cardiovascular Disease

## 2019-08-19 LAB — SURGICAL PATHOLOGY

## 2019-08-19 NOTE — Telephone Encounter (Signed)
Transition Care Management Follow-up Telephone Call  Date of discharge and from where: [facility]  How have you been since you were released from the hospital? Patient stated that she was fine. Didn't really state how she was feeling.   Any questions or concerns? No   Items Reviewed:  Did the pt receive and understand the discharge instructions provided? Yes   Medications obtained and verified? Yes   Any new allergies since your discharge? No   Dietary orders reviewed? Yes  Do you have support at home? Yes   Functional Questionnaire: (I = Independent and D = Dependent) ADLs: I  Bathing/Dressing- I  Meal Prep- I  Eating- I  Maintaining continence- I  Transferring/Ambulation- I  Managing Meds- I  Follow up appointments reviewed:   PCP Hospital f/u appt confirmed? No  Patient refused to make hospital follow up appointment with me. Stated she would call back and schedule an appointment later. Stated she really didn't understand why she needed to see her PCP when another doctor was handling her issue.   [facility] f/u appt confirmed? yes Scheduled to see Dr. Benson Norway.  Are transportation arrangements needed? No   If their condition worsens, is the pt aware to call PCP or go to the Emergency Dept.? Yes  Was the patient provided with contact information for the PCP's office or ED? Yes  Was to pt encouraged to call back with questions or concerns? Yes

## 2019-08-19 NOTE — Telephone Encounter (Signed)
Called home and cell number- Left message on cell number to return my call- need to complete TCM and schedule hospital follow up visit.

## 2019-08-20 NOTE — Telephone Encounter (Signed)
Noted. Pt refused PCP follow up.

## 2019-08-24 ENCOUNTER — Other Ambulatory Visit: Payer: Self-pay

## 2019-08-24 NOTE — Patient Outreach (Signed)
Red emmi: Red Emmi alert on 08/23/2019 for feeling sad, hopeless and anxious  Placed call to patient, who answered and reports she is ok. Reports she has depression at times. Reports she does not need any assistance with depression at this time.   Reviewed importance of patient discussing depression with primary MD. Review things that help with depression like exercise, getting enough sleep and good nutrition. Reviewed with patient if she feels suicidal or homicidal to call 911. Patient voiced understanding.  PLAN: patient denies needing help at this time. Will plan to close case.  Tomasa Rand, RN, BSN, CEN [facility] [employer] [phone]

## 2019-08-24 NOTE — Anesthesia Postprocedure Evaluation (Signed)
Anesthesia Post Note  Patient: Maurice March  Procedure(s) Performed: ESOPHAGOGASTRODUODENOSCOPY (EGD) WITH PROPOFOL (N/A ) COLONOSCOPY WITH PROPOFOL (N/A ) POLYPECTOMY     Patient location during evaluation: Endoscopy Anesthesia Type: MAC Level of consciousness: awake and alert Pain management: pain level controlled Vital Signs Assessment: post-procedure vital signs reviewed and stable Respiratory status: spontaneous breathing, nonlabored ventilation, respiratory function stable and patient connected to nasal cannula oxygen Cardiovascular status: stable and blood pressure returned to baseline Postop Assessment: no apparent nausea or vomiting Anesthetic complications: no    Last Vitals:  Vitals:   08/18/19 0905 08/18/19 0919  BP: (!) 128/51 (!) 141/54  Pulse: 66 63  Resp: 18 18  Temp: 36.8 C 37.3 C  SpO2: 94% 95%    Last Pain:  Vitals:   08/18/19 0919  TempSrc: Oral  PainSc:                  Dontrell Stuck

## 2019-09-10 ENCOUNTER — Other Ambulatory Visit: Payer: Self-pay | Admitting: Family Medicine

## 2019-09-15 DIAGNOSIS — K754 Autoimmune hepatitis: Secondary | ICD-10-CM | POA: Diagnosis not present

## 2019-09-15 DIAGNOSIS — K7460 Unspecified cirrhosis of liver: Secondary | ICD-10-CM | POA: Diagnosis not present

## 2019-09-15 DIAGNOSIS — R5383 Other fatigue: Secondary | ICD-10-CM | POA: Diagnosis not present

## 2019-09-15 DIAGNOSIS — R21 Rash and other nonspecific skin eruption: Secondary | ICD-10-CM | POA: Diagnosis not present

## 2019-09-17 ENCOUNTER — Other Ambulatory Visit: Payer: Self-pay | Admitting: Family Medicine

## 2019-09-17 ENCOUNTER — Other Ambulatory Visit: Payer: Self-pay | Admitting: Cardiovascular Disease

## 2019-09-23 DIAGNOSIS — R21 Rash and other nonspecific skin eruption: Secondary | ICD-10-CM | POA: Diagnosis not present

## 2019-09-23 DIAGNOSIS — D485 Neoplasm of uncertain behavior of skin: Secondary | ICD-10-CM | POA: Diagnosis not present

## 2019-09-23 DIAGNOSIS — L989 Disorder of the skin and subcutaneous tissue, unspecified: Secondary | ICD-10-CM | POA: Diagnosis not present

## 2019-09-29 ENCOUNTER — Other Ambulatory Visit: Payer: Self-pay | Admitting: Family Medicine

## 2019-10-04 DIAGNOSIS — L820 Inflamed seborrheic keratosis: Secondary | ICD-10-CM | POA: Diagnosis not present

## 2019-10-04 DIAGNOSIS — L309 Dermatitis, unspecified: Secondary | ICD-10-CM | POA: Diagnosis not present

## 2019-10-04 DIAGNOSIS — L728 Other follicular cysts of the skin and subcutaneous tissue: Secondary | ICD-10-CM | POA: Diagnosis not present

## 2019-10-04 DIAGNOSIS — B078 Other viral warts: Secondary | ICD-10-CM | POA: Diagnosis not present

## 2019-10-04 DIAGNOSIS — L570 Actinic keratosis: Secondary | ICD-10-CM | POA: Diagnosis not present

## 2019-10-07 DIAGNOSIS — E1165 Type 2 diabetes mellitus with hyperglycemia: Secondary | ICD-10-CM | POA: Diagnosis not present

## 2019-10-07 DIAGNOSIS — I10 Essential (primary) hypertension: Secondary | ICD-10-CM | POA: Diagnosis not present

## 2019-10-07 DIAGNOSIS — E7800 Pure hypercholesterolemia, unspecified: Secondary | ICD-10-CM | POA: Diagnosis not present

## 2019-10-07 DIAGNOSIS — R21 Rash and other nonspecific skin eruption: Secondary | ICD-10-CM | POA: Diagnosis not present

## 2019-10-13 DIAGNOSIS — R7401 Elevation of levels of liver transaminase levels: Secondary | ICD-10-CM | POA: Diagnosis not present

## 2019-10-13 DIAGNOSIS — E7800 Pure hypercholesterolemia, unspecified: Secondary | ICD-10-CM | POA: Diagnosis not present

## 2019-10-13 DIAGNOSIS — Z79899 Other long term (current) drug therapy: Secondary | ICD-10-CM | POA: Diagnosis not present

## 2019-10-13 DIAGNOSIS — I10 Essential (primary) hypertension: Secondary | ICD-10-CM | POA: Diagnosis not present

## 2019-10-13 DIAGNOSIS — R5383 Other fatigue: Secondary | ICD-10-CM | POA: Diagnosis not present

## 2019-10-13 DIAGNOSIS — E1165 Type 2 diabetes mellitus with hyperglycemia: Secondary | ICD-10-CM | POA: Diagnosis not present

## 2019-11-09 DIAGNOSIS — L309 Dermatitis, unspecified: Secondary | ICD-10-CM | POA: Diagnosis not present

## 2019-12-22 DIAGNOSIS — E1165 Type 2 diabetes mellitus with hyperglycemia: Secondary | ICD-10-CM | POA: Diagnosis not present

## 2019-12-22 DIAGNOSIS — K754 Autoimmune hepatitis: Secondary | ICD-10-CM | POA: Diagnosis not present

## 2019-12-22 DIAGNOSIS — K7460 Unspecified cirrhosis of liver: Secondary | ICD-10-CM | POA: Diagnosis not present

## 2020-03-02 ENCOUNTER — Other Ambulatory Visit: Payer: Self-pay | Admitting: Cardiovascular Disease

## 2020-04-06 ENCOUNTER — Encounter: Payer: Self-pay | Admitting: Adult Health

## 2020-04-06 ENCOUNTER — Other Ambulatory Visit: Payer: Self-pay

## 2020-04-06 ENCOUNTER — Ambulatory Visit (INDEPENDENT_AMBULATORY_CARE_PROVIDER_SITE_OTHER): Payer: Medicare Other | Admitting: Adult Health

## 2020-04-06 ENCOUNTER — Ambulatory Visit: Payer: Medicare Other | Admitting: Pulmonary Disease

## 2020-04-06 DIAGNOSIS — J3089 Other allergic rhinitis: Secondary | ICD-10-CM

## 2020-04-06 DIAGNOSIS — G4733 Obstructive sleep apnea (adult) (pediatric): Secondary | ICD-10-CM | POA: Diagnosis not present

## 2020-04-06 DIAGNOSIS — J302 Other seasonal allergic rhinitis: Secondary | ICD-10-CM

## 2020-04-06 DIAGNOSIS — J4521 Mild intermittent asthma with (acute) exacerbation: Secondary | ICD-10-CM

## 2020-04-06 MED ORDER — ALBUTEROL SULFATE HFA 108 (90 BASE) MCG/ACT IN AERS: 1.0000 | INHALATION_SPRAY | Freq: Four times a day (QID) | RESPIRATORY_TRACT | 2 refills | Status: DC | PRN

## 2020-04-06 NOTE — Patient Instructions (Addendum)
Continue on zytec and flonase daily  Albuterol inhaler As needed  For wheezing .   Wear CPAP each night all night  Work on healthy weight loss  Do not drive if sleepy  Saline nasal rinses and gel As needed    Follow up with Dr. Annamaria Boots  In 1 year and As needed

## 2020-04-06 NOTE — Assessment & Plan Note (Signed)
Severe obstructive sleep apnea with poor CPAP compliance. Patient education was given. Suggestions for dry mouth and CPAP compliance.  Plan  Patient Instructions  Continue on zytec and flonase daily  Albuterol inhaler As needed  For wheezing .   Wear CPAP each night all night  Work on healthy weight loss  Do not drive if sleepy  Saline nasal rinses and gel As needed    Follow up with Dr. Annamaria Boots  In 1 year and As needed

## 2020-04-06 NOTE — Assessment & Plan Note (Signed)
Currently under good control continue on maintenance regimen  Plan  Patient Instructions  Continue on zytec and flonase daily  Albuterol inhaler As needed  For wheezing .   Wear CPAP each night all night  Work on healthy weight loss  Do not drive if sleepy  Saline nasal rinses and gel As needed    Follow up with Dr. Annamaria Boots  In 1 year and As needed

## 2020-04-06 NOTE — Progress Notes (Signed)
@Patient  ID: [name] [name], female    DOB: May 15, 1946, 74 y.o.   MRN: [id number]  Chief Complaint  Patient presents with  . 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  Patient has insulin-dependent diabetes  TEST/EVENTS :  NPSG 07/31/03- AHI 42/ hr, CPAP then titrated to 9. Weight was 200 lbs. Office Spirometry 03/21/17- WNL  Imaging:  12/14/2017-chest x-ray-peribronchial thickening lean thickening noted, lungs otherwise clear 03/21/17-chest x-ray- mild left base subsegmental atelectasis and/or scarring again, no interim change  Cardiac:  Echocardiogram 01/17/17-WNL, EF 55-60 %  04/06/2020 Follow up : Asthma, OSA and AR  Patient presents for a follow-up visit.  Patient has underlying mild asthma, obstructive sleep apnea allergic rhinitis.  She was last seen in June 2019. Says breathing has been doing okay overall.  She remains on Zyrtec and Flonase daily. No albuterol use recently , needs refills. Fall and winter months typically are her worse time of the year.   Got 1st covid vaccine yesterday.   Patient has obstructive sleep apnea on nocturnal CPAP.  Patient says she is doing okay on CPAP.   Usually wears it about 1 hr a night .    CPAP download very poor compliance with 13% usage daily average usage at 2 hours. Patient is on auto CPAP 4 to 10 cm H2O. AHI 0.4. Marland Kitchen Says it causes a dry mouth .  She says she has tried multiple mask and chinstrap's without relief. Pressure has been adjusted and is on the lowest settings without any relief. Patient education on CPAP compliance and potential complications of untreated sleep apnea.  Patient says she has been working on healthy weight loss and is down around 20 pounds.  Allergies  Allergen Reactions  .  Doxycycline Hives  . Epinephrine Hcl Other (See Comments)    REACTION: Had tremors when it was given.  . Erythromycin Ethylsuccinate Other (See Comments)    REACTION: abd spasms  . Iohexol Other (See Comments)     Code: HIVES, Desc: pt needs premedicated-- hives on prev contrast study per md office   . Oxycodone Other (See Comments)    "felt like I was dying"  . Tape Rash    Immunization History  Administered Date(s) Administered  . H1N1 07/25/2008  . Influenza Split 04/22/2011, 06/30/2012  . Influenza Whole 06/24/2006, 05/22/2008  . Influenza,inj,Quad PF,6+ Mos 03/30/2015, 05/14/2018  . Influenza-Unspecified 05/22/2013  . Moderna SARS-COVID-2 Vaccination 04/04/2020  . Pneumococcal Conjugate-13 06/29/2015  . Pneumococcal Polysaccharide-23 09/20/2016  . Td 06/24/2006, 01/07/2014    Past Medical History:  Diagnosis Date  . Allergic rhinitis, cause unspecified   . Arthritis    hands, feet and back  . Benign neoplasm of colon   . Complication of anesthesia    " i WAKE UP WITH TREMORS "  . Cyst and pseudocyst of pancreas   . Depressive disorder, not elsewhere classified   . Diverticulitis   . Diverticulosis of colon (without mention of hemorrhage)   . Esophageal reflux   . Essential and other specified forms of tremor   . Irritable bowel syndrome   . Obesity, unspecified   . Other specified cardiac dysrhythmias(427.89)   . Periapical abscess without sinus   . Polyneuropathy in diabetes(357.2)   . Pure hypercholesterolemia   .  Sleep apnea    CPAP- non compliant  . Type II or unspecified type diabetes mellitus with neurological manifestations, not stated as uncontrolled(250.60)     Tobacco History: Social History   Tobacco Use  Smoking Status Never Smoker  Smokeless Tobacco Never Used   Counseling given: Not Answered   Outpatient Medications Prior to Visit  Medication Sig Dispense Refill  . atenolol (TENORMIN) 25 MG tablet TAKE 1/2 TABLET BY MOUTH 2 (TWO) TIMES  DAILY AS NEEDED. 90 tablet 0  . azaTHIOprine (IMURAN) 50 MG tablet Take 50 mg by mouth daily.    . cetirizine (ZYRTEC) 10 MG tablet Take 10 mg by mouth at bedtime.    . fluticasone (FLONASE) 50 MCG/ACT nasal spray Place 1 spray into both nostrils daily.    . furosemide (LASIX) 40 MG tablet Take 40 mg by mouth daily.    Marland Kitchen guaiFENesin (MUCINEX) 600 MG 12 hr tablet Take 600 mg by mouth 2 (two) times daily as needed for cough or to loosen phlegm.    . Insulin Pen Needle (B-D ULTRAFINE III SHORT PEN) 31G X 8 MM MISC Use to inject insulin two times a day.  Dx: E11.9 100 each 0  . NOVOLIN N RELION 100 UNIT/ML injection Inject 35 Units into the skin 2 (two) times daily before a meal.     . NOVOLIN R RELION 100 UNIT/ML injection Inject 10-50 Units into the skin 3 (three) times daily. Sliding scale    . pantoprazole (PROTONIX) 40 MG tablet TAKE 1 TABLET BY MOUTH EVERY DAY 30 tablet 0  . predniSONE (DELTASONE) 5 MG tablet Take 5 mg by mouth daily with breakfast.    . Respiratory Therapy Supplies (FLUTTER) DEVI Twice a day or as needed 1 each 0  . albuterol (PROAIR HFA) 108 (90 Base) MCG/ACT inhaler Inhale 2 puffs into the lungs every 6 (six) hours as needed for wheezing or shortness of breath. 1 Inhaler 0   No facility-administered medications prior to visit.     Review of Systems:   Constitutional:   No  weight loss, night sweats,  Fevers, chills, fatigue, or  lassitude.  HEENT:   No headaches,  Difficulty swallowing,  Tooth/dental problems, or  Sore throat,                No sneezing, itching, ear ache, +asal congestion, post nasal drip,   CV:  No chest pain,  Orthopnea, PND, swelling in lower extremities, anasarca, dizziness, palpitations, syncope.   GI  No heartburn, indigestion, abdominal pain, nausea, vomiting, diarrhea, change in bowel habits, loss of appetite, bloody stools.   Resp:    No chest wall deformity  Skin: no rash or lesions.  GU: no dysuria, change in color of urine, no  urgency or frequency.  No flank pain, no hematuria   MS:  No joint pain or swelling.  No decreased range of motion.  No back pain.    Physical Exam  BP (!) 148/62 (BP Location: Left Arm, Cuff Size: Normal)   Pulse (!) 59   Temp (!) 97.2 F (36.2 C) (Temporal)   Ht 5' 4.5" (1.638 m)   Wt 203 lb 3.2 oz (92.2 kg)   SpO2 98% Comment: RA  BMI 34.34 kg/m   GEN: A/Ox3; pleasant , NAD, well nourished    HEENT:  [place]/AT,   NOSE-clear, THROAT-clear, no lesions, no postnasal drip or exudate noted.   NECK:  Supple w/ fair ROM; no JVD; normal carotid impulses w/o bruits; no  thyromegaly or nodules palpated; no lymphadenopathy.    RESP  Clear  P & A; w/o, wheezes/ rales/ or rhonchi. no accessory muscle use, no dullness to percussion  CARD:  RRR, no m/r/g, no peripheral edema, pulses intact, no cyanosis or clubbing.  GI:   Soft & nt; nml bowel sounds; no organomegaly or masses detected.   Musco: Warm bil, no deformities or joint swelling noted.   Neuro: alert, no focal deficits noted.    Skin: Warm, no lesions or rashes    Lab Results:  BNP No results found for: BNP  ProBNP  Imaging: No results found.    No flowsheet data found.  No results found for: NITRICOXIDE      Assessment & Plan:   Obstructive sleep apnea Severe obstructive sleep apnea with poor CPAP compliance. Patient education was given. Suggestions for dry mouth and CPAP compliance.  Plan  Patient Instructions  Continue on zytec and flonase daily  Albuterol inhaler As needed  For wheezing .   Wear CPAP each night all night  Work on healthy weight loss  Do not drive if sleepy  Saline nasal rinses and gel As needed    Follow up with Dr. Annamaria Boots  In 1 year and As needed         Seasonal and perennial allergic rhinitis Currently under good control continue on maintenance regimen  Plan  Patient Instructions  Continue on zytec and flonase daily  Albuterol inhaler As needed  For wheezing .   Wear  CPAP each night all night  Work on healthy weight loss  Do not drive if sleepy  Saline nasal rinses and gel As needed    Follow up with Dr. Annamaria Boots  In 1 year and As needed         Asthmatic bronchitis with exacerbation Mild intermittent asthma under good control-continue on trigger prevention Proair refilled no changes in regimen     Rexene Edison, NP 04/06/2020

## 2020-04-06 NOTE — Assessment & Plan Note (Signed)
Mild intermittent asthma under good control-continue on trigger prevention Proair refilled no changes in regimen

## 2020-04-13 DIAGNOSIS — E1165 Type 2 diabetes mellitus with hyperglycemia: Secondary | ICD-10-CM | POA: Diagnosis not present

## 2020-04-13 DIAGNOSIS — I10 Essential (primary) hypertension: Secondary | ICD-10-CM | POA: Diagnosis not present

## 2020-04-13 DIAGNOSIS — R21 Rash and other nonspecific skin eruption: Secondary | ICD-10-CM | POA: Diagnosis not present

## 2020-04-13 DIAGNOSIS — R749 Abnormal serum enzyme level, unspecified: Secondary | ICD-10-CM | POA: Diagnosis not present

## 2020-04-13 DIAGNOSIS — E7800 Pure hypercholesterolemia, unspecified: Secondary | ICD-10-CM | POA: Diagnosis not present

## 2020-05-10 DIAGNOSIS — Z23 Encounter for immunization: Secondary | ICD-10-CM | POA: Diagnosis not present

## 2020-05-12 DIAGNOSIS — R109 Unspecified abdominal pain: Secondary | ICD-10-CM | POA: Diagnosis not present

## 2020-05-17 ENCOUNTER — Emergency Department (HOSPITAL_COMMUNITY)
Admission: EM | Admit: 2020-05-17 | Discharge: 2020-05-17 | Disposition: A | Discharge status: Home / Self Care | Payer: Medicare Other | Attending: Emergency Medicine | Admitting: Emergency Medicine

## 2020-05-17 ENCOUNTER — Other Ambulatory Visit: Payer: Self-pay

## 2020-05-17 ENCOUNTER — Encounter (HOSPITAL_COMMUNITY): Payer: Self-pay | Admitting: Emergency Medicine

## 2020-05-17 ENCOUNTER — Emergency Department (HOSPITAL_COMMUNITY): Payer: Medicare Other

## 2020-05-17 DIAGNOSIS — I491 Atrial premature depolarization: Secondary | ICD-10-CM | POA: Diagnosis not present

## 2020-05-17 DIAGNOSIS — Z79899 Other long term (current) drug therapy: Secondary | ICD-10-CM | POA: Diagnosis not present

## 2020-05-17 DIAGNOSIS — N3000 Acute cystitis without hematuria: Secondary | ICD-10-CM | POA: Diagnosis not present

## 2020-05-17 DIAGNOSIS — Z794 Long term (current) use of insulin: Secondary | ICD-10-CM | POA: Insufficient documentation

## 2020-05-17 DIAGNOSIS — R001 Bradycardia, unspecified: Secondary | ICD-10-CM | POA: Diagnosis not present

## 2020-05-17 DIAGNOSIS — Z20822 Contact with and (suspected) exposure to covid-19: Secondary | ICD-10-CM | POA: Diagnosis not present

## 2020-05-17 DIAGNOSIS — R000 Tachycardia, unspecified: Secondary | ICD-10-CM | POA: Diagnosis not present

## 2020-05-17 DIAGNOSIS — E1142 Type 2 diabetes mellitus with diabetic polyneuropathy: Secondary | ICD-10-CM | POA: Insufficient documentation

## 2020-05-17 DIAGNOSIS — Z85038 Personal history of other malignant neoplasm of large intestine: Secondary | ICD-10-CM | POA: Diagnosis not present

## 2020-05-17 DIAGNOSIS — I10 Essential (primary) hypertension: Secondary | ICD-10-CM | POA: Diagnosis not present

## 2020-05-17 DIAGNOSIS — R197 Diarrhea, unspecified: Secondary | ICD-10-CM | POA: Insufficient documentation

## 2020-05-17 DIAGNOSIS — R531 Weakness: Secondary | ICD-10-CM | POA: Insufficient documentation

## 2020-05-17 DIAGNOSIS — R110 Nausea: Secondary | ICD-10-CM | POA: Insufficient documentation

## 2020-05-17 LAB — TROPONIN I (HIGH SENSITIVITY)
Troponin I (High Sensitivity): 6 ng/L (ref <18)
Troponin I (High Sensitivity): 7 ng/L (ref <18)

## 2020-05-17 LAB — URINALYSIS, ROUTINE W REFLEX MICROSCOPIC
Bilirubin Urine: NEGATIVE
Glucose, UA: NEGATIVE mg/dL
Hgb urine dipstick: NEGATIVE
Ketones, ur: NEGATIVE mg/dL
Nitrite: NEGATIVE
Protein, ur: NEGATIVE mg/dL
Specific Gravity, Urine: 1.014 (ref 1.005–1.030)
WBC, UA: >50 WBC/hpf — ABNORMAL HIGH (ref 0–5)
pH: 6 (ref 5.0–8.0)

## 2020-05-17 LAB — CBC WITH DIFFERENTIAL/PLATELET
Abs Immature Granulocytes: 0.03 10*3/uL (ref 0.00–0.07)
Basophils Absolute: 0 10*3/uL (ref 0.0–0.1)
Basophils Relative: 0 %
Eosinophils Absolute: 0.1 10*3/uL (ref 0.0–0.5)
Eosinophils Relative: 2 %
HCT: 38.8 % (ref 36.0–46.0)
Hemoglobin: 12.7 g/dL (ref 12.0–15.0)
Immature Granulocytes: 1 %
Lymphocytes Relative: 20 %
Lymphs Abs: 1.3 10*3/uL (ref 0.7–4.0)
MCH: 30.5 pg (ref 26.0–34.0)
MCHC: 32.7 g/dL (ref 30.0–36.0)
MCV: 93.3 fL (ref 80.0–100.0)
Monocytes Absolute: 0.6 10*3/uL (ref 0.1–1.0)
Monocytes Relative: 9 %
Neutro Abs: 4.5 10*3/uL (ref 1.7–7.7)
Neutrophils Relative %: 68 %
Platelets: 158 10*3/uL (ref 150–400)
RBC: 4.16 MIL/uL (ref 3.87–5.11)
RDW: 14.1 % (ref 11.5–15.5)
WBC: 6.4 10*3/uL (ref 4.0–10.5)
nRBC: 0 % (ref 0.0–0.2)

## 2020-05-17 LAB — BASIC METABOLIC PANEL
Anion gap: 8 (ref 5–15)
BUN: 23 mg/dL (ref 8–23)
CO2: 27 mmol/L (ref 22–32)
Calcium: 9.4 mg/dL (ref 8.9–10.3)
Chloride: 104 mmol/L (ref 98–111)
Creatinine, Ser: 0.91 mg/dL (ref 0.44–1.00)
GFR, Estimated: >60 mL/min (ref >60)
Glucose, Bld: 117 mg/dL — ABNORMAL HIGH (ref 70–99)
Potassium: 3.3 mmol/L — ABNORMAL LOW (ref 3.5–5.1)
Sodium: 139 mmol/L (ref 135–145)

## 2020-05-17 LAB — RESPIRATORY PANEL BY RT PCR (FLU A&B, COVID)
Influenza A by PCR: NEGATIVE
Influenza B by PCR: NEGATIVE
SARS Coronavirus 2 by RT PCR: NEGATIVE

## 2020-05-17 LAB — CBG MONITORING, ED
Glucose-Capillary: 115 mg/dL — ABNORMAL HIGH (ref 70–99)
Glucose-Capillary: 101 mg/dL — ABNORMAL HIGH (ref 70–99)

## 2020-05-17 MED ORDER — CEPHALEXIN 500 MG PO CAPS: 500.0000 mg | ORAL_CAPSULE | Freq: Two times a day (BID) | ORAL | 0 refills | Status: DC

## 2020-05-17 MED ORDER — POTASSIUM CHLORIDE CRYS ER 20 MEQ PO TBCR
40.0000 meq | EXTENDED_RELEASE_TABLET | Freq: Once | ORAL | Status: AC
  Administered 2020-05-17: 40 meq via ORAL
  Filled 2020-05-17: qty 2

## 2020-05-17 MED ORDER — BOOST PLUS PO LIQD
237.0000 mL | Freq: Once | ORAL | Status: AC
  Administered 2020-05-17: 237 mL via ORAL
  Filled 2020-05-17: qty 237

## 2020-05-17 MED ORDER — CEPHALEXIN 250 MG PO CAPS
500.0000 mg | ORAL_CAPSULE | Freq: Once | ORAL | Status: AC
  Administered 2020-05-17: 500 mg via ORAL
  Filled 2020-05-17: qty 2

## 2020-05-17 NOTE — Discharge Instructions (Signed)
Please stop the atenolol for now Please follow-up with Dr. Gwenlyn Found in a week to have your heart rate rechecked. Do not restart the atenolol until you speak to your cardiologist

## 2020-05-17 NOTE — ED Provider Notes (Signed)
[facility] EMERGENCY DEPARTMENT Provider Note   CSN: [id number] Arrival date & time: 05/17/20  0217     History Chief Complaint  Patient presents with  . Weakness  . Bradycardia    [name] [name] is a 74 y.o. female.  The history is provided by the patient.  Weakness Severity:  Moderate Onset quality:  Gradual Timing:  Intermittent Progression:  Worsening Chronicity:  New Relieved by:  None tried Worsened by:  Nothing Associated symptoms: diarrhea and nausea   Associated symptoms: no abdominal pain, no chest pain, no cough, no fever, no syncope and no vomiting       Patient with history of diabetes, sleep apnea, autoimmune hepatitis presents with generalized weakness.  Patient reports over the past several nights she has been feeling weak and having fluctuations in her glucose.  There is also a reports of palpitations Tonight patient reports her symptoms are worsening and EMS was called by her family.  Patient was noted to be bradycardic into the 40s.  Patient does take atenolol, but no recent changes.  Only new medications include OTC vitamins. No syncope.  No active chest pain. Patient reports she received the second shot of moderna vaccine over a week ago. Past Medical History:  Diagnosis Date  . Allergic rhinitis, cause unspecified   . Arthritis    hands, feet and back  . Benign neoplasm of colon   . Complication of anesthesia    " i WAKE UP WITH TREMORS "  . Cyst and pseudocyst of pancreas   . Depressive disorder, not elsewhere classified   . Diverticulitis   . Diverticulosis of colon (without mention of hemorrhage)   . Esophageal reflux   . Essential and other specified forms of tremor   . Irritable bowel syndrome   . Obesity, unspecified   . Other specified cardiac dysrhythmias(427.89)   . Periapical abscess without sinus   . Polyneuropathy in diabetes(357.2)   . Pure hypercholesterolemia   . Sleep apnea    CPAP- non compliant  . Type  II or unspecified type diabetes mellitus with neurological manifestations, not stated as uncontrolled(250.60)     Patient Active Problem List   Diagnosis Date Noted  . Anemia 08/17/2019  . Hypertension 08/17/2019  . Anxiety 08/17/2019  . Lower GI bleeding 08/17/2019  . Acute lower GI bleeding 08/16/2019  . Autoimmune hepatitis ([place]) 12/29/2018  . Chest pain 12/22/2018  . Asthmatic bronchitis with exacerbation 03/21/2017  . Elevated LFTs 12/24/2016  . SVT (supraventricular tachycardia) ([place]) 12/24/2016  . Peripheral edema 12/24/2016  . S/P foot surgery, right 07/27/2015  . Preoperative clearance 06/29/2015  . Angioedema 08/30/2014  . Recurrent UTI 01/06/2014  . Chronic insomnia 01/06/2014  . Vitamin D deficiency 08/20/2010  . 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 Lung nodule 08/03/2007  . CYST AND PSEUDOCYST OF PANCREAS 08/03/2007  . 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  . 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 HYPERCHOLESTEROLEMIA 12/30/2006  . OBESITY 12/30/2006  . TREMOR, ESSENTIAL 12/30/2006  . Palpitations 12/30/2006  . Seasonal and perennial allergic rhinitis 12/30/2006  . GERD 12/30/2006  . DIVERTICULOSIS, COLON 12/30/2006  . IBS 12/30/2006  . Obstructive sleep apnea 12/30/2006  . URINARY INCONTINENCE 12/30/2006  . Poorly controlled type 2 diabetes mellitus ([place]) 12/22/2006    Past Surgical History:  Procedure Laterality Date  . ABDOMINAL HYSTERECTOMY    . CALCANEAL OSTEOTOMY Right 07/27/2015   Procedure: RIGHT CALCANEAL OSTEOTOMY ;  Surgeon: Wylene Simmer, MD;  Location: [facility];  Service: Orthopedics;  Laterality: Right;  . CHOLECYSTECTOMY     15-20 years ago  . COLONOSCOPY WITH PROPOFOL N/A 08/17/2019   Procedure: COLONOSCOPY WITH PROPOFOL;  Surgeon: Carol Ada, MD;  Location: [facility];  Service: Endoscopy;  Laterality: N/A;  . ESOPHAGOGASTRODUODENOSCOPY (EGD) WITH PROPOFOL N/A 08/17/2019   Procedure: ESOPHAGOGASTRODUODENOSCOPY  (EGD) WITH PROPOFOL;  Surgeon: Carol Ada, MD;  Location: [facility];  Service: Endoscopy;  Laterality: N/A;  . GASTROCNEMIUS RECESSION Right 07/27/2015   Procedure: RIGHT GASTROC RECESSION;  Surgeon: Wylene Simmer, MD;  Location: [facility];  Service: Orthopedics;  Laterality: Right;  . KIDNEY STONE SURGERY  1980's   removal  . 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 PARTIAL HYSTERECTOMY  1990's  . POLYPECTOMY  08/17/2019   Procedure: POLYPECTOMY;  Surgeon: Carol Ada, MD;  Location: [facility] ENDOSCOPY;  Service: Endoscopy;;  . ROTATOR CUFF REPAIR  2005     OB History    Gravida  6   Para  4   Term      Preterm      AB  1   Living        SAB      TAB      Ectopic      Multiple      Live Births              Family History  Problem Relation Age of Onset  . Clotting disorder Mother   . Cirrhosis Father   . Diabetes Other        2 aunts  . Cancer Other        grandmother  . Allergies Other        "whole family"  . Asthma Other        "whole family"    Social History   Tobacco Use  . Smoking status: Never Smoker  . Smokeless tobacco: Never Used  Vaping Use  . Vaping Use: Never used  Substance Use Topics  . Alcohol use: No    Alcohol/week: 0.0 standard drinks  . Drug use: No    Home Medications Prior to Admission medications   Medication Sig Start Date End Date Taking? Authorizing Provider  albuterol (PROAIR HFA) 108 (90 Base) MCG/ACT inhaler Inhale 1-2 puffs into the lungs every 6 (six) hours as needed for wheezing or shortness of breath. 04/06/20   Parrett, Fonnie Mu, NP  atenolol (TENORMIN) 25 MG tablet TAKE 1/2 TABLET BY MOUTH 2 (TWO) TIMES DAILY AS NEEDED. 03/02/20   Lorretta Harp, MD  azaTHIOprine (IMURAN) 50 MG tablet Take 50 mg by mouth daily. 07/30/19   [provider]  cetirizine (ZYRTEC) 10 MG tablet Take 10 mg by mouth at bedtime.    [provider]  fluticasone (FLONASE) 50 MCG/ACT nasal spray Place 1 spray  into both nostrils daily.    [provider]  furosemide (LASIX) 40 MG tablet Take 40 mg by mouth daily.    [provider]  guaiFENesin (MUCINEX) 600 MG 12 hr tablet Take 600 mg by mouth 2 (two) times daily as needed for cough or to loosen phlegm.    [provider]  Insulin Pen Needle (B-D ULTRAFINE III SHORT PEN) 31G X 8 MM MISC Use to inject insulin two times a day.  Dx: E11.9 12/01/14   Bedsole, Mervyn Gay, MD  NOVOLIN N RELION 100 UNIT/ML injection Inject 35 Units into the skin 2 (two) times daily before a meal.  11/20/18  [provider]  NOVOLIN R RELION 100 UNIT/ML injection Inject 10-50 Units into the skin 3 (three) times daily. Sliding scale 11/20/18   [provider]  pantoprazole (PROTONIX) 40 MG tablet TAKE 1 TABLET BY MOUTH EVERY DAY 09/17/19   Wendling, Crosby Oyster, DO  predniSONE (DELTASONE) 5 MG tablet Take 5 mg by mouth daily with breakfast.    [provider]  Respiratory Therapy Supplies (FLUTTER) DEVI Twice a day or as needed 12/22/17   Lauraine Rinne, NP    Allergies    Doxycycline, Epinephrine hcl, Erythromycin ethylsuccinate, Iohexol, Oxycodone, and Tape  Review of Systems   Review of Systems  Constitutional: Negative for fever.  Respiratory: Negative for cough.   Cardiovascular: Positive for palpitations. Negative for chest pain and syncope.  Gastrointestinal: Positive for diarrhea and nausea. Negative for abdominal pain and vomiting.       Chronic diarrhea due to meds  Neurological: Positive for weakness. Negative for syncope.  All other systems reviewed and are negative.   Physical Exam Updated Vital Signs BP (!) 210/67 (BP Location: Right Arm)   Pulse (!) 46   Temp (!) 97.5 F (36.4 C) (Temporal)   Resp 15   SpO2 100%   Physical Exam CONSTITUTIONAL: Well developed/well nourished HEAD: Normocephalic/atraumatic EYES: EOMI/PERRL ENMT: Mucous membranes moist NECK: supple no meningeal signs SPINE/BACK:entire  spine nontender CV: S1/S2 noted, no murmurs/rubs/gallops noted, bradycardic LUNGS: Lungs are clear to auscultation bilaterally, no apparent distress ABDOMEN: soft, nontender, no rebound or guarding, bowel sounds noted throughout abdomen GU:no cva tenderness NEURO: Pt is awake/alert/appropriate, moves all extremitiesx4.  No facial droop.  No arm or leg drift EXTREMITIES: pulses normal/equalx4, full ROM SKIN: warm, color normal PSYCH: no abnormalities of mood noted, alert and oriented to situation  ED Results / Procedures / Treatments   Labs (all labs ordered are listed, but only abnormal results are displayed) Labs Reviewed  BASIC METABOLIC PANEL - Abnormal; Notable for the following components:      Result Value   Potassium 3.3 (*)    Glucose, Bld 117 (*)    All other components within normal limits  URINALYSIS, ROUTINE W REFLEX MICROSCOPIC - Abnormal; Notable for the following components:   APPearance HAZY (*)    Leukocytes,Ua LARGE (*)    WBC, UA >50 (*)    Bacteria, UA RARE (*)    All other components within normal limits  CBG MONITORING, ED - Abnormal; Notable for the following components:   Glucose-Capillary 115 (*)    All other components within normal limits  CBG MONITORING, ED - Abnormal; Notable for the following components:   Glucose-Capillary 101 (*)    All other components within normal limits  RESPIRATORY PANEL BY RT PCR (FLU A&B, COVID)  URINE CULTURE  CBC WITH DIFFERENTIAL/PLATELET  TROPONIN I (HIGH SENSITIVITY)  TROPONIN I (HIGH SENSITIVITY)    EKG EKG Interpretation  Date/Time:  Wednesday May 17 2020 02:33:06 EDT Ventricular Rate:  47 PR Interval:    QRS Duration: 90 QT Interval:  471 QTC Calculation: 417 R Axis:   58 Text Interpretation: Sinus bradycardia Ventricular premature complex Interpretation limited secondary to artifact Confirmed by Ripley Fraise ([id number]) on 05/17/2020 2:44:37 AM   Radiology DG Chest Port 1 View  Result Date:  05/17/2020 CLINICAL DATA:  Weakness EXAM: PORTABLE CHEST 1 VIEW COMPARISON:  None. FINDINGS: The heart size and mediastinal contours are within normal limits. Both lungs are clear. The visualized skeletal structures are unremarkable. IMPRESSION: No active disease. Electronically  Signed   By: Ulyses Jarred M.D.   On: 05/17/2020 03:04    Procedures Procedures   Medications Ordered in ED Medications  lactose free nutrition (BOOST PLUS) liquid 237 mL (has no administration in time range)  potassium chloride SA (KLOR-CON) CR tablet 40 mEq (40 mEq Oral Given 05/17/20 0457)  cephALEXin (KEFLEX) capsule 500 mg (500 mg Oral Given 05/17/20 9675)    ED Course  I have reviewed the triage vital signs and the nursing notes.  Pertinent labs & imaging results that were available during my care of the patient were reviewed by me and considered in my medical decision making (see chart for details).    MDM Rules/Calculators/A&P                          2:48 AM Patient presents with generalized weakness and bradycardia.  Initial EKG revealed sinus bradycardia without AV block.  Prehospital rhythm strip shows bradycardia with PVCs and PACs No meds were administered prior to arrival.  Imaging and labs have been ordered 3:45 AM Daughter arrived She reports pt has had generalized and episodes of hypoglycemia Tonight it was noted her HR was in the 40s, therefore EMS was called 4:27 AM Patient improved.  No signs of dysrhythmia on telemetry monitoring.  Labs are pending at this time 5:28 AM Patient ambulatory and in no acute distress.  Heart rates maintaining 40s to 50s in sinus bradycardia She is found to have a UTI.  She will be given Keflex as this is not cause any significant interactions. Urine cultures have been sent. Overall patient appears well and she feels comfortable for discharge home. She has been instructed to stop her atenolol for now as this may be exacerbating her bradycardia. There  are no signs of AV block or dysrhythmia She will follow-up with her cardiologist in a week for recheck of her heart rate Should be advised to check her heart rate and blood pressure at home if she has any continued weakness Final Clinical Impression(s) / ED Diagnoses Final diagnoses:  Bradycardia  Generalized weakness  Acute cystitis without hematuria    Rx / DC Orders ED Discharge Orders         Ordered    cephALEXin (KEFLEX) 500 MG capsule  2 times daily        05/17/20 0527           Ripley Fraise, MD 05/17/20 [phone]

## 2020-05-17 NOTE — ED Notes (Signed)
Patient is brady with PACs

## 2020-05-17 NOTE — ED Triage Notes (Signed)
Patient from home, she has had fluctuations in her blood sugars.  She has also been having palpitation feelings during this time.  Patient has been having some feeling of weakness.  She does take atenolol for a tachy arrhythmia.  Patient is brady in the mid 49's.  Patient is CAOx4, bp of 138/67.

## 2020-05-19 LAB — URINE CULTURE: Culture: >=100000 — AB

## 2020-05-28 ENCOUNTER — Other Ambulatory Visit: Payer: Self-pay | Admitting: Cardiovascular Disease

## 2020-06-22 ENCOUNTER — Other Ambulatory Visit: Payer: Self-pay | Admitting: Cardiovascular Disease

## 2020-07-07 ENCOUNTER — Other Ambulatory Visit: Payer: Self-pay | Admitting: Cardiovascular Disease

## 2020-12-13 ENCOUNTER — Other Ambulatory Visit: Payer: Self-pay | Admitting: Cardiovascular Disease

## 2021-01-10 ENCOUNTER — Telehealth: Payer: Self-pay | Admitting: Cardiovascular Disease

## 2021-01-10 MED ORDER — ATENOLOL 25 MG PO TABS: 12.5000 mg | ORAL_TABLET | Freq: Two times a day (BID) | ORAL | 2 refills | Status: DC

## 2021-01-10 NOTE — Telephone Encounter (Signed)
Let patient know that I sent in refills for her Atenolol over to the pharmacy.

## 2021-01-10 NOTE — Telephone Encounter (Signed)
*  STAT* If patient is at the pharmacy, call can be transferred to refill team.   1. Which medications need to be refilled? (please list name of each medication and dose if known) atenolol (TENORMIN) 25 MG tablet  2. Which pharmacy/location (including street and city if local pharmacy) is medication to be sent to?  [id number] - [address]. Do they need a 30 day or 90 day supply? [address]

## 2021-03-05 ENCOUNTER — Other Ambulatory Visit: Payer: Self-pay

## 2021-03-05 ENCOUNTER — Ambulatory Visit (INDEPENDENT_AMBULATORY_CARE_PROVIDER_SITE_OTHER): Payer: Medicare Other | Admitting: Physician Assistant

## 2021-03-05 VITALS — BP 124/68 | HR 59 | Ht 64.5 in | Wt 199.0 lb

## 2021-03-05 DIAGNOSIS — E785 Hyperlipidemia, unspecified: Secondary | ICD-10-CM | POA: Diagnosis not present

## 2021-03-05 DIAGNOSIS — I10 Essential (primary) hypertension: Secondary | ICD-10-CM

## 2021-03-05 DIAGNOSIS — E119 Type 2 diabetes mellitus without complications: Secondary | ICD-10-CM

## 2021-03-05 DIAGNOSIS — I5032 Chronic diastolic (congestive) heart failure: Secondary | ICD-10-CM

## 2021-03-05 NOTE — Progress Notes (Signed)
Cardiology Office Note:    Date:  03/07/2021   ID:  [name] [name], DOB 1945/07/31, MRN [id number]  PCP:  Jinny Sanders, MD   [facility] Providers Cardiologist:  Quay Burow, MD     Referring MD: Jinny Sanders, MD   Chief Complaint  Patient presents with   Follow-up    Seen for Dr. Gwenlyn Found    History of Present Illness:    [name] [name] is a 75 y.o. female with a hx of obesity, chronic diastolic dysfunction, palpitation, hyperlipidemia, DM2, obstructive sleep apnea not on CPAP.  Previous event monitor obtained in June 2013 showed PACs and PVCs.  She also has chronic transaminitis elevation.  Patient was admitted to the hospital in June 2020 with atypical chest pain.  Serial troponin was negative.  Myoview was low risk without evidence of infarction or ischemia.  Patient was last seen by Dr. Gwenlyn Found on 01/06/2019 at which time she was doing well.  Patient was last seen in the ED in October 2021 due to generalized weakness.  She was noted to be bradycardic in the 40s.  She was also found to have a urinary tract infection and was given Keflex.  Patient was instructed to stop her atenolol and was eventually discharged from the ED.  Although she was instructed to follow-up with cardiology service in 1 week.   Patient presents today for follow-up.  When I discussed her ED visit in October, she says she does not remember the visit.  She feels the documentation should be on another patient as she never had bradycardia nor urine urinary tract infection last year.  She feels the ED physician was documented wrong.  She is currently on half a dose of atenolol.  She denies any significant bradycardia episode.  She denies any dizziness, feeling of passing out, chest pain or worsening dyspnea.  Due to history of elevated liver enzyme, she is not on statin medication.  Overall, she has been doing well from the cardiac perspective and can follow-up in 1 year.   Past Medical History:  Diagnosis Date    Allergic rhinitis, cause unspecified    Arthritis    hands, feet and back   Benign neoplasm of colon    Complication of anesthesia    " i WAKE UP WITH TREMORS "   Cyst and pseudocyst of pancreas    Depressive disorder, not elsewhere classified    Diverticulitis    Diverticulosis of colon (without mention of hemorrhage)    Esophageal reflux    Essential and other specified forms of tremor    Irritable bowel syndrome    Obesity, unspecified    Other specified cardiac dysrhythmias(427.89)    Periapical abscess without sinus    Polyneuropathy in diabetes(357.2)    Pure hypercholesterolemia    Sleep apnea    CPAP- non compliant   Type II or unspecified type diabetes mellitus with neurological manifestations, not stated as uncontrolled(250.60)     Past Surgical History:  Procedure Laterality Date   ABDOMINAL HYSTERECTOMY     CALCANEAL OSTEOTOMY Right 07/27/2015   Procedure: RIGHT CALCANEAL OSTEOTOMY ;  Surgeon: [name] Simmer, MD;  Location: [place];  Service: Orthopedics;  Laterality: Right;   CHOLECYSTECTOMY     15-20 years ago   COLONOSCOPY WITH PROPOFOL N/A 08/17/2019   Procedure: COLONOSCOPY WITH PROPOFOL;  Surgeon: [name] Ada, MD;  Location: [place];  Service: Endoscopy;  Laterality: N/A;   ESOPHAGOGASTRODUODENOSCOPY (EGD) WITH PROPOFOL N/A 08/17/2019   Procedure:  ESOPHAGOGASTRODUODENOSCOPY (EGD) WITH PROPOFOL;  Surgeon: [name] Ada, MD;  Location: [place];  Service: Endoscopy;  Laterality: N/A;   GASTROCNEMIUS RECESSION Right 07/27/2015   Procedure: RIGHT GASTROC RECESSION;  Surgeon: [name] Simmer, MD;  Location: [place];  Service: Orthopedics;  Laterality: Right;   KIDNEY STONE SURGERY  1980's   removal   LITHOTRIPSY     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   PARTIAL HYSTERECTOMY  1990's   POLYPECTOMY  08/17/2019   Procedure: POLYPECTOMY;  Surgeon: [name] Ada, MD;  Location: [place];  Service: Endoscopy;;   ROTATOR CUFF REPAIR  2005     Current Medications: Current Meds  Medication Sig   albuterol (PROAIR HFA) 108 (90 Base) MCG/ACT inhaler Inhale 1-2 puffs into the lungs every 6 (six) hours as needed for wheezing or shortness of breath.   Ascorbic Acid (VITAMIN C) 500 MG CAPS See admin instructions.   atenolol (TENORMIN) 25 MG tablet Take 12.5 mg by mouth daily.   azaTHIOprine (IMURAN) 50 MG tablet Take 50 mg by mouth daily.   B Complex Vitamins (VITAMIN B COMPLEX) TABS See admin instructions.   cephALEXin (KEFLEX) 500 MG capsule Take 1 capsule (500 mg total) by mouth 2 (two) times daily.   cetirizine (ZYRTEC) 10 MG tablet Take 10 mg by mouth at bedtime.   fluticasone (FLONASE) 50 MCG/ACT nasal spray Place 1 spray into both nostrils daily.   furosemide (LASIX) 40 MG tablet Take 40 mg by mouth daily.   glucose blood test strip OneTouch Ultra Blue Test Strip  TEST 3 TIMES DAILY E11.65   guaiFENesin (MUCINEX) 600 MG 12 hr tablet Take 600 mg by mouth 2 (two) times daily as needed for cough or to loosen phlegm.   insulin NPH-regular Human (NOVOLIN 70/30) (70-30) 100 UNIT/ML injection 46u/45u   Magnesium 300 MG CAPS 1 capsule with a meal   pantoprazole (PROTONIX) 40 MG tablet TAKE 1 TABLET BY MOUTH EVERY DAY   predniSONE (DELTASONE) 5 MG tablet Take 5 mg by mouth daily with breakfast.   Respiratory Therapy Supplies (FLUTTER) DEVI Twice a day or as needed   [DISCONTINUED] atenolol (TENORMIN) 25 MG tablet Take 0.5-1 tablets (12.5-25 mg total) by mouth 2 (two) times daily. KEEP OV.     Allergies:   Doxycycline, Epinephrine hcl, Erythromycin ethylsuccinate, Iohexol, Oxycodone, and Tape   Social History   Socioeconomic History   Marital status: Widowed    Spouse name: Not on file   Number of children: 5   Years of education: Not on file   Highest education level: Not on file  Occupational History    Employer: RETIRED  Tobacco Use   Smoking status: Never   Smokeless tobacco: Never  Vaping Use   Vaping Use: Never  used  Substance and Sexual Activity   Alcohol use: No    Alcohol/week: 0.0 standard drinks   Drug use: No   Sexual activity: Not on file  Other Topics Concern   Not on file  Social History Narrative   Husband treated for testicular cancer, 2 cups coffee a day, no exercise   Social Determinants of Health   Financial Resource Strain: Not on file  Food Insecurity: Not on file  Transportation Needs: Not on file  Physical Activity: Not on file  Stress: Not on file  Social Connections: Not on file     Family History: The patient's family history includes Allergies in an other family member; Asthma in an other family member; Cancer in an other family member;  Cirrhosis in her father; Clotting disorder in her mother; Diabetes in an other family member.  ROS:   Please see the history of present illness.     All other systems reviewed and are negative.  EKGs/Labs/Other Studies Reviewed:    The following studies were reviewed today:  Echo 01/17/2017 LV EF: 55% -   60%   -------------------------------------------------------------------  Indications:      (R06.02).   -------------------------------------------------------------------  History:   PMH:  SVT. Acquired from the patient and from the  patient&'s chart.  Dyspnea.  Risk factors:  Diabetes mellitus.  Obese. Dyslipidemia.   -------------------------------------------------------------------  Study Conclusions   - Left ventricle: The cavity size was normal. There was mild focal    basal hypertrophy of the septum. Systolic function was normal.    The estimated ejection fraction was in the range of 55% to 60%.    Wall motion was normal; there were no regional wall motion    abnormalities. Left ventricular diastolic function parameters    were normal.  - Mitral valve: Calcified annulus.   Impressions:   - Normal LV function; trace MR and TR.   EKG:  EKG is ordered today.  The ekg ordered today demonstrates sinus  rhythm, heart rate 59  Recent Labs: 05/17/2020: BUN 23; Creatinine, Ser 0.91; Hemoglobin 12.7; Platelets 158; Potassium 3.3; Sodium 139  Recent Lipid Panel    Component Value Date/Time   CHOL 162 01/07/2017 0955   TRIG 203.0 (H) 01/07/2017 0955   HDL 38.20 (L) 01/07/2017 0955   CHOLHDL 4 01/07/2017 0955   VLDL 40.6 (H) 01/07/2017 0955   LDLCALC 123 (H) 07/03/2012 1243   LDLDIRECT 91.0 01/07/2017 0955     Risk Assessment/Calculations:           Physical Exam:    VS:  BP 124/68   Pulse (!) 59   Ht 5' 4.5" (1.638 m)   Wt 199 lb (90.3 kg)   SpO2 99%   BMI 33.63 kg/m     Wt Readings from Last 3 Encounters:  03/05/21 199 lb (90.3 kg)  04/06/20 203 lb 3.2 oz (92.2 kg)  08/16/19 227 lb 1.2 oz (103 kg)     GEN:  Well nourished, well developed in no acute distress HEENT: Normal NECK: No JVD; No carotid bruits LYMPHATICS: No lymphadenopathy CARDIAC: RRR, no murmurs, rubs, gallops RESPIRATORY:  Clear to auscultation without rales, wheezing or rhonchi  ABDOMEN: Soft, non-tender, non-distended MUSCULOSKELETAL:  No edema; No deformity  SKIN: Warm and dry NEUROLOGIC:  Alert and oriented x 3 PSYCHIATRIC:  Normal affect   ASSESSMENT:    1. Essential hypertension   2. Hyperlipidemia LDL goal <100   3. Controlled type 2 diabetes mellitus without complication, without long-term current use of insulin ([place])   4. Chronic diastolic heart failure (HCC)    PLAN:    In order of problems listed above:  Hypertension: Blood pressure well controlled on current therapy.  Although previous visit ED visit mention possible bradycardia, patient adamantly denies this.  She thinks the ED physician documented under the wrong patient.  Her heart rate today is normal.  I did not change the atenolol dosing  Hyperlipidemia: Diet and exercise  DM2: Managed by primary care provider  Chronic diastolic heart failure: euvolemic on exam        Medication Adjustments/Labs and Tests  Ordered: Current medicines are reviewed at length with the patient today.  Concerns regarding medicines are outlined above.  Orders Placed This Encounter  Procedures  EKG 12-Lead   No orders of the defined types were placed in this encounter.   Patient Instructions  Medication Instructions:  No changes *If you need a refill on your cardiac medications before your next appointment, please call your pharmacy*   Lab Work: None ordered If you have labs (blood work) drawn today and your tests are completely normal, you will receive your results only by: [place] (if you have MyChart) OR A paper copy in the mail If you have any lab test that is abnormal or we need to change your treatment, we will call you to review the results.   Testing/Procedures: None ordered   Follow-Up: At [facility], you and your health needs are our priority.  As part of our continuing mission to provide you with exceptional heart care, we have created designated Provider Care Teams.  These Care Teams include your primary Cardiologist (physician) and Advanced Practice Providers (APPs -  Physician Assistants and Nurse Practitioners) who all work together to provide you with the care you need, when you need it.  We recommend signing up for the patient portal called "MyChart".  Sign up information is provided on this After Visit Summary.  MyChart is used to connect with patients for Virtual Visits (Telemedicine).  Patients are able to view lab/test results, encounter notes, upcoming appointments, etc.  Non-urgent messages can be sent to your provider as well.   To learn more about what you can do with MyChart, go to [email or web address].    Your next appointment:   12 month(s)  The format for your next appointment:   In Person  Provider:   You may see Quay Burow, MD or one of the following Advanced Practice Providers on your designated Care Team:   Sande Rives, PA-C Coletta Memos, FNP     Signed, Almyra Deforest, [place]  03/07/2021 11:40 PM    [place]

## 2021-03-05 NOTE — Patient Instructions (Addendum)
Medication Instructions:  No changes *If you need a refill on your cardiac medications before your next appointment, please call your pharmacy*   Lab Work: None ordered If you have labs (blood work) drawn today and your tests are completely normal, you will receive your results only by: [place] (if you have MyChart) OR A paper copy in the mail If you have any lab test that is abnormal or we need to change your treatment, we will call you to review the results.   Testing/Procedures: None ordered   Follow-Up: At [facility], you and your health needs are our priority.  As part of our continuing mission to provide you with exceptional heart care, we have created designated Provider Care Teams.  These Care Teams include your primary Cardiologist (physician) and Advanced Practice Providers (APPs -  Physician Assistants and Nurse Practitioners) who all work together to provide you with the care you need, when you need it.  We recommend signing up for the patient portal called "MyChart".  Sign up information is provided on this After Visit Summary.  MyChart is used to connect with patients for Virtual Visits (Telemedicine).  Patients are able to view lab/test results, encounter notes, upcoming appointments, etc.  Non-urgent messages can be sent to your provider as well.   To learn more about what you can do with MyChart, go to [email or web address].    Your next appointment:   12 month(s)  The format for your next appointment:   In Person  Provider:   You may see Quay Burow, MD or one of the following Advanced Practice Providers on your designated Care Team:   [place], PA-C Coletta Memos, FNP

## 2021-03-07 ENCOUNTER — Encounter: Payer: Self-pay | Admitting: Physician Assistant

## 2021-04-06 ENCOUNTER — Other Ambulatory Visit: Payer: Self-pay | Admitting: Cardiovascular Disease

## 2021-04-09 NOTE — Progress Notes (Deleted)
HPI F never smoker followed for OSA, hx RLL nodule/ mucus plug, allergic rhinitis, complicated by insomnia, GERD, IBS, obesity, SVT, DM 2 NPSG 07/31/03- AHI 42/ hr, CPAP then titrated to 9. Weight was 200 lbs. Echocardiogram 01/17/17-WNL, EF 55-60 % Office Spirometry 03/21/17- WNL ----------------------------------------------------------------------------------   11/05/17- 45 yoF widow never smoker followed for OSA, Insomnia,  hx RLL nodule/ mucus plug, complicated by DM 2, GERD, IBS, SVT CPAP auto 4-10/ [facility] ----OSA; DME: [facility] Pt wears CPAP nightly but is having trouble with face mask. DL attached. Pt will need order for new supplies.  Had trouble wearing CPAP while she had the flu.  Mask is not fitting well. Download 93% compliance AHI 0.7/hour.  She still sleeps better with CPAP. Otherwise breathing has been comfortable with no recent need for albuterol or Symbicort, no routine cough.  04/10/21- 6 yoF widow never smoker followed for OSA, Insomnia,  hx RLL nodule/ mucus plug, complicated by DM 2, GERD, IBS, SVT, Asthmatic Bronchitis,HTN, Autoimmune hepatitis,  ProAir hfa, Flonase, prednisone 5 mg daily, Imuran,  CPAP auto 4-10/ [facility]- Body weight today- Covid vax-   ROS-see HPI + = positive Constitutional:   No-   weight loss, night sweats, fevers, chills, fatigue, lassitude. HEENT:   +headaches, no-difficulty swallowing, tooth/dental problems, sore throat,       No-  sneezing, itching, ear ache, nasal congestion, +post nasal drip,  CV:  No-   chest pain, orthopnea, PND, swelling in lower extremities, anasarca, dizziness, palpitations Resp: + shortness of breath with exertion or at rest.              productive cough,  non-productive cough,  No- coughing up of blood.              No- change in color of mucus.  No- wheezing.   Skin: No- rash or lesions. GI:   +heartburn, indigestion, no-abdominal pain, nausea, vomiting,  GU:  MS:  +  joint pain or swelling.   Neuro-      nothing unusual Psych:  No- change in mood or affect. No depression or anxiety.  No memory loss.  OBJ- Physical Exam General- Alert, Oriented, Affect-appropriate, Distress- none acute, + overweight Skin- rash-none, lesions- none, excoriation- none Lymphadenopathy- none Head- atraumatic            Eyes- Gross vision intact, PERRLA, conjunctivae and secretions clear            Ears- Hearing, canals-normal            Nose-  turbinate edema, no-Septal dev, mucus, polyps, erosion, perforation             Throat- Mallampati III-IV , mucosa clear-not dry , drainage- none, tonsils- atrophic, own teeth Neck- flexible , trachea midline, no stridor , thyroid nl, carotid no bruit Chest - symmetrical excursion , unlabored           Heart/CV- RRR , no murmur , no gallop  , no rub, nl s1 s2                           - JVD- none , edema- none, stasis changes- none, varices- none           Lung- clear to P&A, wheeze- none, cough- none , dullness-none, rub- none           Chest wall-  Abd-  Br/ Gen/ Rectal- Not done, not indicated Extrem- cyanosis- none, clubbing, none, atrophy-  none, strength- nl Neuro- grossly intact to observation

## 2021-04-10 ENCOUNTER — Ambulatory Visit: Payer: Medicare Other | Admitting: Internal Medicine

## 2021-04-12 ENCOUNTER — Ambulatory Visit: Payer: Medicare Other | Admitting: Internal Medicine

## 2021-06-07 ENCOUNTER — Ambulatory Visit: Payer: Medicare Other | Admitting: Internal Medicine

## 2021-07-04 DIAGNOSIS — K7460 Unspecified cirrhosis of liver: Secondary | ICD-10-CM | POA: Diagnosis not present

## 2021-07-04 DIAGNOSIS — K754 Autoimmune hepatitis: Secondary | ICD-10-CM | POA: Diagnosis not present

## 2021-07-17 DIAGNOSIS — M8589 Other specified disorders of bone density and structure, multiple sites: Secondary | ICD-10-CM | POA: Diagnosis not present

## 2021-08-23 DIAGNOSIS — K754 Autoimmune hepatitis: Secondary | ICD-10-CM | POA: Diagnosis not present

## 2021-09-21 ENCOUNTER — Other Ambulatory Visit: Payer: Self-pay | Admitting: *Deleted

## 2021-09-21 ENCOUNTER — Encounter: Payer: Self-pay | Admitting: Family Medicine

## 2021-09-21 ENCOUNTER — Ambulatory Visit (INDEPENDENT_AMBULATORY_CARE_PROVIDER_SITE_OTHER): Payer: HMO | Admitting: Family Medicine

## 2021-09-21 ENCOUNTER — Other Ambulatory Visit: Payer: Self-pay

## 2021-09-21 VITALS — BP 131/67 | HR 63 | Temp 98.1°F | Ht 63.25 in | Wt 203.2 lb

## 2021-09-21 DIAGNOSIS — R3989 Other symptoms and signs involving the genitourinary system: Secondary | ICD-10-CM | POA: Diagnosis not present

## 2021-09-21 DIAGNOSIS — Z87442 Personal history of urinary calculi: Secondary | ICD-10-CM | POA: Diagnosis not present

## 2021-09-21 DIAGNOSIS — M545 Low back pain, unspecified: Secondary | ICD-10-CM

## 2021-09-21 DIAGNOSIS — M8580 Other specified disorders of bone density and structure, unspecified site: Secondary | ICD-10-CM | POA: Insufficient documentation

## 2021-09-21 LAB — POC URINALSYSI DIPSTICK (AUTOMATED)
Bilirubin, UA: NEGATIVE
Blood, UA: NEGATIVE
Glucose, UA: NEGATIVE
Ketones, UA: NEGATIVE
Nitrite, UA: NEGATIVE
Protein, UA: NEGATIVE
Spec Grav, UA: 1.02 (ref 1.010–1.025)
Urobilinogen, UA: 0.2 E.U./dL
pH, UA: 6 (ref 5.0–8.0)

## 2021-09-21 LAB — POCT UA - MICROSCOPIC ONLY

## 2021-09-21 NOTE — Patient Instructions (Signed)
We will call with urine culture results. ?

## 2021-09-21 NOTE — Progress Notes (Signed)
Patient ID: [name] [name], female    DOB: 12-14-1945, 76 y.o.   MRN: [id number]  This visit was conducted in person.  BP 131/67   Pulse 63   Temp 98.1 F (36.7 C) (Oral)   Ht 5' 3.25" (1.607 m)   Wt 203 lb 3 oz (92.2 kg)   SpO2 98%   BMI 35.71 kg/m    CC: Chief Complaint  Patient presents with   Bladder Pressure   Fluid Re   Back Pain    Subjective:   HPI: [name] [name] is a 76 y.o. female presenting on 09/21/2021 for Bladder Pressure, Fluid Re, and Back Pain  I have not seen this patient since 2023.    She has been diagnosed with cirrhosis due to NASH.   She has been on  Azathioprine. This med causes diarrhea that then changes to constipation.  She has been straining to have BMs.  In last 3 weeks, she has pressure at end of urine stream.. for a few second.Marland Kitchen occurring 2-3 times a day.  Low back pain bilateral .. feels more when bladder is full. Some frequency, urgency.  No blood in urine.  No vaginal prolapse. Hx of kidney stones, staghorn calculi.. 30 years ago.  Previous saw Dr. Vonita [name]   Hx of hysterectomy and bladder tack age 27. Wt Readings from Last 3 Encounters:  09/21/21 203 lb 3 oz (92.2 kg)  03/05/21 199 lb (90.3 kg)  04/06/20 203 lb 3.2 oz (92.2 kg)   Hx of stable spinal stenosis.      Relevant past medical, surgical, family and social history reviewed and updated as indicated. Interim medical history since our last visit reviewed. Allergies and medications reviewed and updated. Outpatient Medications Prior to Visit  Medication Sig Dispense Refill   albuterol (PROAIR HFA) 108 (90 Base) MCG/ACT inhaler Inhale 1-2 puffs into the lungs every 6 (six) hours as needed for wheezing or shortness of breath. 1 each 2   atenolol (TENORMIN) 25 MG tablet TAKE 0.5-1 TABLETS (12.5-25 MG TOTAL) BY MOUTH 2 (TWO) TIMES DAILY. 180 tablet 3   atenolol (TENORMIN) 25 MG tablet Take 1 tablet by mouth in the morning and 1/2 tablet by mouth at night as needed      azaTHIOprine (IMURAN) 50 MG tablet Take 50 mg by mouth daily.     B Complex Vitamins (VITAMIN B COMPLEX) TABS See admin instructions.     cetirizine (ZYRTEC) 10 MG tablet Take 10 mg by mouth at bedtime.     Continuous Blood Gluc Receiver (FREESTYLE LIBRE 2 READER) DEVI See admin instructions.     Continuous Blood Gluc Sensor (FREESTYLE LIBRE 2 SENSOR) MISC See admin instructions.     fluticasone (FLONASE) 50 MCG/ACT nasal spray Place 1 spray into both nostrils daily.     furosemide (LASIX) 40 MG tablet Take 40 mg by mouth 2 (two) times daily.     glucose blood test strip OneTouch Ultra Blue Test Strip  TEST 3 TIMES DAILY E11.65     insulin NPH Human (NOVOLIN N) 100 UNIT/ML injection 40u     insulin regular (NOVOLIN R) 100 units/mL injection 35U     Magnesium 300 MG CAPS 1 capsule with a meal     pantoprazole (PROTONIX) 40 MG tablet TAKE 1 TABLET BY MOUTH EVERY DAY 30 tablet 0   predniSONE (DELTASONE) 5 MG tablet Take 5 mg by mouth daily with breakfast.     Ascorbic Acid (VITAMIN C) 500 MG CAPS See admin instructions.  furosemide (LASIX) 40 MG tablet Take 40 mg by mouth daily.     guaiFENesin (MUCINEX) 600 MG 12 hr tablet Take 600 mg by mouth 2 (two) times daily as needed for cough or to loosen phlegm.     insulin NPH-regular Human (NOVOLIN 70/30) (70-30) 100 UNIT/ML injection 46u/45u     Respiratory Therapy Supplies (FLUTTER) DEVI Twice a day or as needed 1 each 0   No facility-administered medications prior to visit.     Per HPI unless specifically indicated in ROS section below Review of Systems  Constitutional:  Negative for fatigue and fever.  HENT:  Negative for ear pain.   Eyes:  Negative for pain.  Respiratory:  Negative for chest tightness and shortness of breath.   Cardiovascular:  Negative for chest pain, palpitations and leg swelling.  Gastrointestinal:  Negative for abdominal pain.  Genitourinary:  Positive for decreased urine volume, flank pain and urgency. Negative for  dysuria, hematuria, vaginal discharge and vaginal pain.  Musculoskeletal:  Positive for back pain.  Objective:  BP 131/67   Pulse 63   Temp 98.1 F (36.7 C) (Oral)   Ht 5' 3.25" (1.607 m)   Wt 203 lb 3 oz (92.2 kg)   SpO2 98%   BMI 35.71 kg/m   Wt Readings from Last 3 Encounters:  09/21/21 203 lb 3 oz (92.2 kg)  03/05/21 199 lb (90.3 kg)  04/06/20 203 lb 3.2 oz (92.2 kg)      Physical Exam Constitutional:      General: She is not in acute distress.    Appearance: Normal appearance. She is well-developed. She is obese. She is not ill-appearing or toxic-appearing.  HENT:     Head: Normocephalic.     Right Ear: Hearing, tympanic membrane, ear canal and external ear normal. Tympanic membrane is not erythematous, retracted or bulging.     Left Ear: Hearing, tympanic membrane, ear canal and external ear normal. Tympanic membrane is not erythematous, retracted or bulging.     Nose: No mucosal edema or rhinorrhea.     Right Sinus: No maxillary sinus tenderness or frontal sinus tenderness.     Left Sinus: No maxillary sinus tenderness or frontal sinus tenderness.     Mouth/Throat:     Pharynx: Uvula midline.  Eyes:     General: Lids are normal. Lids are everted, no foreign bodies appreciated.     Conjunctiva/sclera: Conjunctivae normal.     Pupils: Pupils are equal, round, and reactive to light.  Neck:     Thyroid: No thyroid mass or thyromegaly.     Vascular: No carotid bruit.     Trachea: Trachea normal.  Cardiovascular:     Rate and Rhythm: Normal rate and regular rhythm.     Pulses: Normal pulses.     Heart sounds: Normal heart sounds, S1 normal and S2 normal. No murmur heard.   No friction rub. No gallop.  Pulmonary:     Effort: Pulmonary effort is normal. No tachypnea or respiratory distress.     Breath sounds: Normal breath sounds. No decreased breath sounds, wheezing, rhonchi or rales.  Abdominal:     General: Bowel sounds are normal.     Palpations: Abdomen is soft.      Tenderness: There is abdominal tenderness in the suprapubic area. There is left CVA tenderness.  Musculoskeletal:     Cervical back: Normal range of motion and neck supple.     Lumbar back: Tenderness present. No bony tenderness.  Back:  Skin:    General: Skin is warm and dry.     Findings: No rash.  Neurological:     Mental Status: She is alert.  Psychiatric:        Mood and Affect: Mood is not anxious or depressed.        Speech: Speech normal.        Behavior: Behavior normal. Behavior is cooperative.        Thought Content: Thought content normal.        Judgment: Judgment normal.      Results for orders placed or performed during the hospital encounter of 05/17/20  Respiratory Panel by RT PCR (Flu A&B, Covid) - Nasopharyngeal Swab   Specimen: Nasopharyngeal Swab  Result Value Ref Range   SARS Coronavirus 2 by RT PCR NEGATIVE NEGATIVE   Influenza A by PCR NEGATIVE NEGATIVE   Influenza B by PCR NEGATIVE NEGATIVE  Urine culture   Specimen: Urine, Clean Catch  Result Value Ref Range   Specimen Description URINE, CLEAN CATCH    Special Requests      NONE Performed at [facility], [address] [place], [place] [address]    Culture >=100,000 COLONIES/mL PROTEUS MIRABILIS (A)    Report Status 05/19/2020 FINAL    Organism ID, Bacteria PROTEUS MIRABILIS (A)       Susceptibility   Proteus mirabilis - MIC*    AMPICILLIN <=2 SENSITIVE Sensitive     CEFAZOLIN <=4 SENSITIVE Sensitive     CEFTRIAXONE <=0.25 SENSITIVE Sensitive     CIPROFLOXACIN <=0.25 SENSITIVE Sensitive     GENTAMICIN <=1 SENSITIVE Sensitive     IMIPENEM 2 SENSITIVE Sensitive     NITROFURANTOIN RESISTANT Resistant     TRIMETH/SULFA <=20 SENSITIVE Sensitive     AMPICILLIN/SULBACTAM <=2 SENSITIVE Sensitive     PIP/TAZO <=4 SENSITIVE Sensitive     * >=100,000 COLONIES/mL PROTEUS MIRABILIS  Basic metabolic panel  Result Value Ref Range   Sodium 139 135 - 145 mmol/L   Potassium 3.3 (L) 3.5 -  5.1 mmol/L   Chloride 104 98 - 111 mmol/L   CO2 27 22 - 32 mmol/L   Glucose, Bld 117 (H) 70 - 99 mg/dL   BUN 23 8 - 23 mg/dL   Creatinine, Ser 4.69 0.44 - 1.00 mg/dL   Calcium 9.4 8.9 - 62.9 mg/dL   GFR, Estimated >52 >84 mL/min   Anion gap 8 5 - 15  CBC with Differential/Platelet  Result Value Ref Range   WBC 6.4 4.0 - 10.5 K/uL   RBC 4.16 3.87 - 5.11 MIL/uL   Hemoglobin 12.7 12.0 - 15.0 g/dL   HCT 13.2 44.0 - 10.2 %   MCV 93.3 80.0 - 100.0 fL   MCH 30.5 26.0 - 34.0 pg   MCHC 32.7 30.0 - 36.0 g/dL   RDW 72.5 36.6 - 44.0 %   Platelets 158 150 - 400 K/uL   nRBC 0.0 0.0 - 0.2 %   Neutrophils Relative % 68 %   Neutro Abs 4.5 1.7 - 7.7 K/uL   Lymphocytes Relative 20 %   Lymphs Abs 1.3 0.7 - 4.0 K/uL   Monocytes Relative 9 %   Monocytes Absolute 0.6 0.1 - 1.0 K/uL   Eosinophils Relative 2 %   Eosinophils Absolute 0.1 0.0 - 0.5 K/uL   Basophils Relative 0 %   Basophils Absolute 0.0 0.0 - 0.1 K/uL   Immature Granulocytes 1 %   Abs Immature Granulocytes 0.03 0.00 -  0.07 K/uL  Urinalysis, Routine w reflex microscopic Urine, Clean Catch  Result Value Ref Range   Color, Urine YELLOW YELLOW   APPearance HAZY (A) CLEAR   Specific Gravity, Urine 1.014 1.005 - 1.030   pH 6.0 5.0 - 8.0   Glucose, UA NEGATIVE NEGATIVE mg/dL   Hgb urine dipstick NEGATIVE NEGATIVE   Bilirubin Urine NEGATIVE NEGATIVE   Ketones, ur NEGATIVE NEGATIVE mg/dL   Protein, ur NEGATIVE NEGATIVE mg/dL   Nitrite NEGATIVE NEGATIVE   Leukocytes,Ua LARGE (A) NEGATIVE   RBC / HPF 0-5 0 - 5 RBC/hpf   WBC, UA >50 (H) 0 - 5 WBC/hpf   Bacteria, UA RARE (A) NONE SEEN   Squamous Epithelial / LPF 0-5 0 - 5  CBG monitoring, ED  Result Value Ref Range   Glucose-Capillary 115 (H) 70 - 99 mg/dL  CBG monitoring, ED  Result Value Ref Range   Glucose-Capillary 101 (H) 70 - 99 mg/dL  Troponin I (High Sensitivity)  Result Value Ref Range   Troponin I (High Sensitivity) 6 <18 ng/L  Troponin I (High Sensitivity)  Result Value  Ref Range   Troponin I (High Sensitivity) 7 <18 ng/L    This visit occurred during the SARS-CoV-2 public health emergency.  Safety protocols were in place, including screening questions prior to the visit, additional usage of staff PPE, and extensive cleaning of exam room while observing appropriate contact time as indicated for disinfecting solutions.   COVID 19 screen:  No recent travel or known exposure to COVID19 The patient denies respiratory symptoms of COVID 19 at this time. The importance of social distancing was discussed today.   Assessment and Plan    Problem List Items Addressed This Visit     Acute bilateral low back pain without sciatica   History of nephrolithiasis   Sensation of pressure in bladder area - Primary   Relevant Orders   POCT Urinalysis Dipstick (Automated) (Completed)   Urine Culture   This patient has a history of multiple kidney stones including staghorn calculi.  Her urinalysis today is contaminated but does show some increased white blood cells.  We will send for culture. There is no evidence of hematuria to suggest current kidney stone but her symptoms are suspicious.  If pain or urinary symptoms or not improving we can consider renal CT versus referral back to urology.  Orders Placed This Encounter  Procedures   Urine Culture   POCT Urinalysis Dipstick (Automated)   POCT UA - Microscopic Only      Kerby Nora, MD

## 2021-09-24 LAB — URINE CULTURE
MICRO NUMBER:: 13085371
SPECIMEN QUALITY:: ADEQUATE

## 2021-09-24 MED ORDER — SULFAMETHOXAZOLE-TRIMETHOPRIM 800-160 MG PO TABS: 1.0000 | ORAL_TABLET | Freq: Two times a day (BID) | ORAL | 0 refills | Status: DC

## 2021-09-24 NOTE — Addendum Note (Signed)
Addended by: Carter Kitten on: 09/24/2021 12:23 PM ? ? Modules accepted: Orders ? ?

## 2021-09-25 ENCOUNTER — Ambulatory Visit: Payer: No Typology Code available for payment source | Admitting: Family Medicine

## 2021-10-08 DIAGNOSIS — K7460 Unspecified cirrhosis of liver: Secondary | ICD-10-CM | POA: Diagnosis not present

## 2021-10-08 DIAGNOSIS — K754 Autoimmune hepatitis: Secondary | ICD-10-CM | POA: Diagnosis not present

## 2021-10-08 DIAGNOSIS — K5900 Constipation, unspecified: Secondary | ICD-10-CM | POA: Diagnosis not present

## 2021-10-09 DIAGNOSIS — K754 Autoimmune hepatitis: Secondary | ICD-10-CM | POA: Diagnosis not present

## 2021-12-19 DIAGNOSIS — M8580 Other specified disorders of bone density and structure, unspecified site: Secondary | ICD-10-CM | POA: Diagnosis not present

## 2021-12-19 DIAGNOSIS — E7800 Pure hypercholesterolemia, unspecified: Secondary | ICD-10-CM | POA: Diagnosis not present

## 2021-12-19 DIAGNOSIS — I10 Essential (primary) hypertension: Secondary | ICD-10-CM | POA: Diagnosis not present

## 2021-12-19 DIAGNOSIS — K754 Autoimmune hepatitis: Secondary | ICD-10-CM | POA: Diagnosis not present

## 2021-12-19 DIAGNOSIS — F419 Anxiety disorder, unspecified: Secondary | ICD-10-CM | POA: Diagnosis not present

## 2021-12-19 DIAGNOSIS — E1165 Type 2 diabetes mellitus with hyperglycemia: Secondary | ICD-10-CM | POA: Diagnosis not present

## 2021-12-31 DIAGNOSIS — E1165 Type 2 diabetes mellitus with hyperglycemia: Secondary | ICD-10-CM | POA: Diagnosis not present

## 2021-12-31 DIAGNOSIS — K754 Autoimmune hepatitis: Secondary | ICD-10-CM | POA: Diagnosis not present

## 2021-12-31 DIAGNOSIS — K7460 Unspecified cirrhosis of liver: Secondary | ICD-10-CM | POA: Diagnosis not present

## 2022-01-14 ENCOUNTER — Ambulatory Visit (INDEPENDENT_AMBULATORY_CARE_PROVIDER_SITE_OTHER): Payer: HMO

## 2022-01-14 VITALS — Ht 64.0 in | Wt 200.0 lb

## 2022-01-14 DIAGNOSIS — Z0000 Encounter for general adult medical examination without abnormal findings: Secondary | ICD-10-CM

## 2022-01-28 DIAGNOSIS — I10 Essential (primary) hypertension: Secondary | ICD-10-CM | POA: Diagnosis not present

## 2022-01-28 DIAGNOSIS — E7800 Pure hypercholesterolemia, unspecified: Secondary | ICD-10-CM | POA: Diagnosis not present

## 2022-01-28 DIAGNOSIS — E1165 Type 2 diabetes mellitus with hyperglycemia: Secondary | ICD-10-CM | POA: Diagnosis not present

## 2022-01-28 DIAGNOSIS — K754 Autoimmune hepatitis: Secondary | ICD-10-CM | POA: Diagnosis not present

## 2022-01-30 DIAGNOSIS — K7682 Hepatic encephalopathy: Secondary | ICD-10-CM | POA: Diagnosis not present

## 2022-01-30 DIAGNOSIS — R197 Diarrhea, unspecified: Secondary | ICD-10-CM | POA: Diagnosis not present

## 2022-01-30 DIAGNOSIS — K754 Autoimmune hepatitis: Secondary | ICD-10-CM | POA: Diagnosis not present

## 2022-03-02 DIAGNOSIS — S8001XA Contusion of right knee, initial encounter: Secondary | ICD-10-CM | POA: Diagnosis not present

## 2022-03-02 DIAGNOSIS — S8392XA Sprain of unspecified site of left knee, initial encounter: Secondary | ICD-10-CM | POA: Diagnosis not present

## 2022-03-07 DIAGNOSIS — S8001XA Contusion of right knee, initial encounter: Secondary | ICD-10-CM | POA: Diagnosis not present

## 2022-03-07 DIAGNOSIS — S8392XA Sprain of unspecified site of left knee, initial encounter: Secondary | ICD-10-CM | POA: Diagnosis not present

## 2022-04-12 ENCOUNTER — Telehealth: Payer: Self-pay | Admitting: Family Medicine

## 2022-04-12 ENCOUNTER — Ambulatory Visit
Admission: EM | Admit: 2022-04-12 | Discharge: 2022-04-12 | Disposition: A | Discharge status: Home / Self Care | Payer: HMO | Attending: Urgent Care | Admitting: Urgent Care

## 2022-04-12 ENCOUNTER — Other Ambulatory Visit: Payer: Self-pay | Admitting: Cardiovascular Disease

## 2022-04-12 ENCOUNTER — Ambulatory Visit
Admission: RE | Admit: 2022-04-12 | Discharge: 2022-04-12 | Disposition: A | Discharge status: Home / Self Care | Payer: Self-pay | Source: Ambulatory Visit

## 2022-04-12 ENCOUNTER — Ambulatory Visit (INDEPENDENT_AMBULATORY_CARE_PROVIDER_SITE_OTHER): Payer: HMO

## 2022-04-12 DIAGNOSIS — S9032XA Contusion of left foot, initial encounter: Secondary | ICD-10-CM

## 2022-04-12 DIAGNOSIS — M7989 Other specified soft tissue disorders: Secondary | ICD-10-CM | POA: Diagnosis not present

## 2022-04-12 DIAGNOSIS — Z794 Long term (current) use of insulin: Secondary | ICD-10-CM

## 2022-04-12 DIAGNOSIS — S99922A Unspecified injury of left foot, initial encounter: Secondary | ICD-10-CM

## 2022-04-12 DIAGNOSIS — E1165 Type 2 diabetes mellitus with hyperglycemia: Secondary | ICD-10-CM | POA: Diagnosis not present

## 2022-04-12 DIAGNOSIS — M79672 Pain in left foot: Secondary | ICD-10-CM | POA: Diagnosis not present

## 2022-04-12 DIAGNOSIS — E119 Type 2 diabetes mellitus without complications: Secondary | ICD-10-CM

## 2022-04-12 NOTE — ED Provider Notes (Signed)
[facility]  Note:  This document was prepared using Systems analyst and may include unintentional dictation errors.  MRN: [id number] DOB: July 16, 1946  Subjective:   [name] [name] is a 76 y.o. female presenting for 4 day history of acute onset persistent left foot pain, multiple toe pain.  Has swelling and bruising over the second and third toes.  She does report a longstanding history of injuring her foot.  She is a poorly controlled type II diabetic treated with insulin.  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  No open wounds over the foot, drainage of pus or bleeding.  No current facility-administered medications for this encounter.  Current Outpatient Medications:    albuterol (PROAIR HFA) 108 (90 Base) MCG/ACT inhaler, Inhale 1-2 puffs into the lungs every 6 (six) hours as needed for wheezing or shortness of breath., Disp: 1 each, Rfl: 2   atenolol (TENORMIN) 25 MG tablet, Take 1 tablet by mouth in the morning and 1/2 tablet by mouth at night as needed, Disp: , Rfl:    atenolol (TENORMIN) 25 MG tablet, TAKE 1/2 TO 1 TABLET BY MOUTH 2 (TWO) TIMES DAILY., Disp: 180 tablet, Rfl: 0   azaTHIOprine (IMURAN) 50 MG tablet, Take 50 mg by mouth daily., Disp: , Rfl:    B Complex Vitamins (VITAMIN B COMPLEX) TABS, See admin instructions., Disp: , Rfl:    cetirizine (ZYRTEC) 10 MG tablet, Take 10 mg by mouth at bedtime., Disp: , Rfl:    Continuous Blood Gluc Receiver (FREESTYLE LIBRE 2 READER) DEVI, See admin instructions., Disp: , Rfl:    Continuous Blood Gluc Sensor (FREESTYLE LIBRE 2 SENSOR) MISC, See admin instructions., Disp: , Rfl:    fluticasone (FLONASE) 50 MCG/ACT nasal spray, Place 1 spray into both nostrils daily., Disp: , Rfl:    furosemide (LASIX) 40 MG tablet, Take 40 mg by mouth 2 (two) times daily., Disp: , Rfl:    glucose blood test strip, OneTouch Ultra Blue Test Strip  TEST 3 TIMES DAILY E11.65, Disp: , Rfl:    insulin  NPH Human (NOVOLIN N) 100 UNIT/ML injection, 40u, Disp: , Rfl:    insulin regular (NOVOLIN R) 100 units/mL injection, 35U, Disp: , Rfl:    Magnesium 300 MG CAPS, 1 capsule with a meal, Disp: , Rfl:    pantoprazole (PROTONIX) 40 MG tablet, TAKE 1 TABLET BY MOUTH EVERY DAY, Disp: 30 tablet, Rfl: 0   predniSONE (DELTASONE) 5 MG tablet, Take 10 mg by mouth daily with breakfast., Disp: , Rfl:    sulfamethoxazole-trimethoprim (BACTRIM DS) 800-160 MG tablet, Take 1 tablet by mouth 2 (two) times daily. (Patient not taking: Reported on 01/14/2022), Disp: 6 tablet, Rfl: 0   Allergies  Allergen Reactions   Doxycycline Hives   Epinephrine Hcl Other (See Comments)    REACTION: Had tremors when it was given.   Erythromycin Ethylsuccinate Other (See Comments)    REACTION: abd spasms   Iohexol Other (See Comments)     Code: HIVES, Desc: pt needs premedicated-- hives on prev contrast study per md office    Metformin Other (See Comments)   Oxycodone Other (See Comments)    "felt like I was dying"   Tape Rash    Past Medical History:  Diagnosis Date   Allergic rhinitis, cause unspecified    Arthritis    hands, feet and back   Benign neoplasm of colon    Complication of anesthesia    " i WAKE UP WITH TREMORS "  Cyst and pseudocyst of pancreas    Depressive disorder, not elsewhere classified    Diverticulitis    Diverticulosis of colon (without mention of hemorrhage)    Esophageal reflux    Essential and other specified forms of tremor    Irritable bowel syndrome    Obesity, unspecified    Other specified cardiac dysrhythmias(427.89)    Periapical abscess without sinus    Polyneuropathy in diabetes(357.2)    Pure hypercholesterolemia    Sleep apnea    CPAP- non compliant   Type II or unspecified type diabetes mellitus with neurological manifestations, not stated as uncontrolled(250.60)      Past Surgical History:  Procedure Laterality Date   ABDOMINAL HYSTERECTOMY     CALCANEAL  OSTEOTOMY Right 07/27/2015   Procedure: RIGHT CALCANEAL OSTEOTOMY ;  Surgeon: Wylene Simmer, MD;  Location: [place];  Service: Orthopedics;  Laterality: Right;   CHOLECYSTECTOMY     15-20 years ago   COLONOSCOPY WITH PROPOFOL N/A 08/17/2019   Procedure: COLONOSCOPY WITH PROPOFOL;  Surgeon: Carol Ada, MD;  Location: [place];  Service: Endoscopy;  Laterality: N/A;   ESOPHAGOGASTRODUODENOSCOPY (EGD) WITH PROPOFOL N/A 08/17/2019   Procedure: ESOPHAGOGASTRODUODENOSCOPY (EGD) WITH PROPOFOL;  Surgeon: Carol Ada, MD;  Location: [place];  Service: Endoscopy;  Laterality: N/A;   GASTROCNEMIUS RECESSION Right 07/27/2015   Procedure: RIGHT GASTROC RECESSION;  Surgeon: Wylene Simmer, MD;  Location: [place];  Service: Orthopedics;  Laterality: Right;   KIDNEY STONE SURGERY  1980's   removal   LITHOTRIPSY     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   PARTIAL HYSTERECTOMY  1990's   POLYPECTOMY  08/17/2019   Procedure: POLYPECTOMY;  Surgeon: Carol Ada, MD;  Location: [facility] ENDOSCOPY;  Service: Endoscopy;;   ROTATOR CUFF REPAIR  2005    Family History  Problem Relation Age of Onset   Clotting disorder Mother    Cirrhosis Father    Diabetes Other        2 aunts   Cancer Other        grandmother   Allergies Other        "whole family"   Asthma Other        "whole family"    Social History   Tobacco Use   Smoking status: Never   Smokeless tobacco: Never  Vaping Use   Vaping Use: Never used  Substance Use Topics   Alcohol use: No    Alcohol/week: 0.0 standard drinks of alcohol   Drug use: No    ROS   Objective:   Vitals: BP (!) 146/80   Pulse 60   Temp 98.4 F (36.9 C)   Resp 18   Ht 5' 3.5" (1.613 m)   Wt 200 lb (90.7 kg)   SpO2 97%   BMI 34.87 kg/m   Physical Exam Constitutional:      General: She is not in acute distress.    Appearance: Normal appearance. She is well-developed. She is not ill-appearing, toxic-appearing or diaphoretic.  HENT:      Head: Normocephalic and atraumatic.     Nose: Nose normal.     Mouth/Throat:     Mouth: Mucous membranes are moist.  Eyes:     General: No scleral icterus.       Right eye: No discharge.        Left eye: No discharge.     Extraocular Movements: Extraocular movements intact.  Cardiovascular:     Rate and Rhythm: Normal rate.  Pulmonary:  Effort: Pulmonary effort is normal.  Skin:    General: Skin is warm and dry.  Neurological:     General: No focal deficit present.     Mental Status: She is alert and oriented to person, place, and time.  Psychiatric:        Mood and Affect: Mood normal.        Behavior: Behavior normal.     DG Foot Complete Left  Result Date: 04/12/2022 CLINICAL DATA:  Trauma, pain and swelling EXAM: LEFT FOOT - COMPLETE 3+ VIEW COMPARISON:  None Available. FINDINGS: No recent fracture or dislocation is seen. There is 1 mm smooth marginated calcification adjacent to the PIP joint of second toe, possibly residual from previous injury. Degenerative changes are noted in first metatarsophalangeal joint with bony spurs. Plantar spur is seen in calcaneus. There is coarse calcification at the attachment of Achilles tendon to the calcaneus suggesting calcific tendinosis. Arterial calcifications are seen in soft tissues. IMPRESSION: No recent fracture or dislocation is seen. 1 mm smooth marginated calcific density adjacent to the PIP joint of second toe may suggest old avulsion. Degenerative changes are noted in first metatarsophalangeal joint. Electronically Signed   By: Elmer Picker M.D.   On: 04/12/2022 13:32    Postop shoe applied to the left foot.  Assessment and Plan :   PDMP not reviewed this encounter.  1. Contusion of left foot, initial encounter   2. Swelling of limb   3. Foot injury, left, initial encounter   4. Left foot pain   5. Uncontrolled type 2 diabetes mellitus with hyperglycemia ([place])   6. Type 2 diabetes mellitus treated with insulin ([facility])      X-rays negative for an acute fracture.  Recommended conservative management for her foot contusion with a postop shoe, continue taking prednisone.  Patient unfortunately has to avoid NSAIDs and Tylenol due to her chronic conditions. Counseled patient on potential for adverse effects with medications prescribed/recommended today, ER and return-to-clinic precautions discussed, patient verbalized understanding.    Jaynee Eagles, PA-C 04/12/22 1350

## 2022-04-12 NOTE — Telephone Encounter (Signed)
Noted  

## 2022-04-12 NOTE — Telephone Encounter (Signed)
I spoke with pt; 3 - 4 days ago pt hit her lt foot; today the lt second toe is red and red streak going up top of lt foot to ankle, swelling on bottom of foot; pain level 4-5 but pt said she tolerates pain well. Pt is going to [employer] now. No CP or SOB. Pt will cb next wk with update.Sending note to Dr Diona Browner and Butch Penny CMA.

## 2022-04-12 NOTE — Telephone Encounter (Signed)
Patient called in and stated she is diabetic and hit her foot 3-4 days ago. She stated that it is swollen under the toe and there is about an 2inch red streak going to her ankle. She stated it is tender to the touch and on a scale of 1-10 it is at an 4. Sent over to [place].

## 2022-04-12 NOTE — Discharge Instructions (Signed)
Wear the postop shoe to the left foot over the next week.  Make sure you continue taking your regular prednisone course as this can help your pain.

## 2022-04-12 NOTE — ED Triage Notes (Signed)
Patient to Urgent Care with complaints of a left foot injury that occurred four days ago. Reports hitting her foot on a door, 2nd/ 3rd toes bruised and swollen.   Patient diabetic.

## 2022-04-30 DIAGNOSIS — E1165 Type 2 diabetes mellitus with hyperglycemia: Secondary | ICD-10-CM | POA: Diagnosis not present

## 2022-05-27 ENCOUNTER — Other Ambulatory Visit: Payer: Self-pay | Admitting: Family Medicine

## 2022-05-27 ENCOUNTER — Telehealth: Payer: Self-pay | Admitting: Family Medicine

## 2022-05-27 ENCOUNTER — Telehealth: Payer: Self-pay

## 2022-05-27 DIAGNOSIS — N2889 Other specified disorders of kidney and ureter: Secondary | ICD-10-CM

## 2022-05-27 NOTE — Telephone Encounter (Signed)
Teams message sent to [facility] Urgent referrals as instructed by Dr. Diona Browner.

## 2022-05-27 NOTE — Telephone Encounter (Signed)
Saw 05/13/2022 at [facility], and Jaci Standard, [place], ordered an MRI of the lumbar spine, and it showed:  6.6 cm left sided renal mass with concern for renal cancer - has been referred to Oncology by Orthopedics.  Per report they wanted a CT abdomen and pelvis.  Presumably, a Urology consultation will be needed, as well.    I am going to send to Dr. Diona Browner, and I will text her directly right now.

## 2022-05-27 NOTE — Telephone Encounter (Signed)
[name] [name], will you please let the stat urgent referral team know I have placed an noncontrast abdominal pelvis CT ( she has allergy to contrast) for this patient. Pt is aware.

## 2022-05-27 NOTE — Telephone Encounter (Signed)
Jaci Standard PA [facility] wants to speak with provider about MRI Lumbar spine results; kidney mass that needs further eval with CT abd and pelvis. Sending note to Dr Diona Browner who ois out of office and Dr Lorelei Pont who is in office.

## 2022-05-27 NOTE — Telephone Encounter (Signed)
Called and spoke with the patient. Reviewed last CT abdomen pelvis with her from 2016 following pancreatic changes, stable. left renal cyst seen at that time  She is agreeable to moving forward with CT abdomen and pelvis although she is allergic to dye so this will have to be without contrast as well as referral to urology.  She would like to do both of them in [place].  Of note, she has been seen in the past by Dr. Terance Hart (now retired) at [facility].

## 2022-05-27 NOTE — Telephone Encounter (Signed)
Pt is scheduled to have a CT scan at [place] tomorrow she states that there was talk about her having a CT scan of the Chest done as well.  Please advise pt wanted to have them both done at the same time.  She also states that she is allergic to the contrast per [facility] imaging she has to drink it.

## 2022-05-28 ENCOUNTER — Other Ambulatory Visit: Payer: Self-pay | Admitting: Family Medicine

## 2022-05-28 ENCOUNTER — Telehealth: Payer: Self-pay | Admitting: Family Medicine

## 2022-05-28 ENCOUNTER — Other Ambulatory Visit: Payer: HMO

## 2022-05-28 DIAGNOSIS — N2889 Other specified disorders of kidney and ureter: Secondary | ICD-10-CM

## 2022-05-28 NOTE — Telephone Encounter (Signed)
Ms. Demedeiros notified as instructed by telephone. She states she had a significant reaction to Iohexol.  She had hives after receiving this contrast.  She didn't know if it was oral or IV.

## 2022-05-28 NOTE — Telephone Encounter (Signed)
Noted  

## 2022-05-28 NOTE — Telephone Encounter (Signed)
Spoke with radiologist... non contrast CT to look at renal mass would not be helpful.  Given her reaction was hives to ioxehol.. this is considered  contrast reaction not true allergy... recommended contrast CT and and pelvis  with benadryl and prednisone prep.   If she refused we could instead do MRI with contrast ( different Contrast)  Let referral coordinator team know to cancel CT today as prep takes > 13 hours.   I will place  CT abd pelvis with Contrast...   Prep is as follows: Take 50 mg prednisone 13 hours prior to CT, at 7 hours prior to CT and at 1 hour prior to CT.  Also take Benadryl 50 mg  1 hour prior to CT.  I will send in rx.

## 2022-05-28 NOTE — Telephone Encounter (Signed)
I recommended to her to wait on the chest CT.. start with abd pelvis CT because that could change the management of other issues depending on what it shows.   I she allergic to the IV  contrast  or the oral contrast? I did  place the Ct as non contrast so no IV contrast dye.

## 2022-05-28 NOTE — Telephone Encounter (Signed)
Called patient to discuss appt instructions for her new scheduled CT tomorrow 05/29/2022.  They were headed to another appt and stated that they will call me back later today to discuss.    **See referral notes for information as well**  Spoke with [facility], cancelled other order for CT Abd Pelvis WO CM and also cancelled the appt scheduled for today 05/28/22 at 3pm.   Patient is scheduled for tomorrow 05/29/2022 at 10:40 AM She will need to arrive at [facility] location by 9:40 AM to drink water 1 hour prior to her scan.   The patient will start her 13 Hour Prep: -First dose by 9:40pm tonight  -Second dose at 3:40am, 7 hour prior to scan -Third dose by 9:40am 1 hour prior to scan  **Patient will need to arrive to [place] by 9:40am for check in and water intake. She will take her last dose at the same time she drinks the water. **  Remove her Freestyle Libre device from skin prior to coming to appt.

## 2022-05-28 NOTE — Telephone Encounter (Signed)
[name] [name] notified as instructed by telephone.  She is agreeable to doing the Prednisone/Benadryl prep.  She does not need any called in.  She states she has plenty of Prednisone 10 mg tablets and will take 5 tablets (50 mg) at 13, 7 and 1 hour prior to CT.  She also has Benadryl at home and will take 50 mg 1 hour prior to the CT.

## 2022-05-28 NOTE — Telephone Encounter (Signed)
Spoke with patient daughter Karlene Einstein, she is aware of instructions for CT scan tomorrow 05/29/2022.

## 2022-05-28 NOTE — Telephone Encounter (Signed)
x

## 2022-05-29 ENCOUNTER — Ambulatory Visit
Admission: RE | Admit: 2022-05-29 | Discharge: 2022-05-29 | Disposition: A | Discharge status: Home / Self Care | Payer: HMO | Source: Ambulatory Visit | Attending: Family Medicine | Admitting: Family Medicine

## 2022-05-29 ENCOUNTER — Telehealth: Payer: Self-pay | Admitting: Physician Assistant

## 2022-05-29 DIAGNOSIS — N2889 Other specified disorders of kidney and ureter: Secondary | ICD-10-CM

## 2022-05-29 MED ORDER — IOPAMIDOL (ISOVUE-300) INJECTION 61%
100.0000 mL | Freq: Once | INTRAVENOUS | Status: AC | PRN
  Administered 2022-05-29: 100 mL via INTRAVENOUS

## 2022-05-29 NOTE — Telephone Encounter (Signed)
Scheduled appointment per 11/08 referral . Patient is aware of appointment date and time. Patient is aware to arrive 15 mins prior to appointment time and to bring updated insurance cards. Patient is aware of location.

## 2022-06-04 ENCOUNTER — Encounter: Payer: Self-pay | Admitting: Family Medicine

## 2022-06-04 ENCOUNTER — Other Ambulatory Visit: Payer: Self-pay | Admitting: Family Medicine

## 2022-06-04 DIAGNOSIS — I700 Atherosclerosis of aorta: Secondary | ICD-10-CM | POA: Insufficient documentation

## 2022-06-04 MED ORDER — AMOXICILLIN-POT CLAVULANATE 875-125 MG PO TABS: 1.0000 | ORAL_TABLET | Freq: Two times a day (BID) | ORAL | 0 refills | Status: DC

## 2022-06-05 ENCOUNTER — Inpatient Hospital Stay: Payer: PPO

## 2022-06-05 ENCOUNTER — Inpatient Hospital Stay: Payer: PPO | Attending: Physician Assistant | Admitting: Physician Assistant

## 2022-06-05 VITALS — BP 156/72 | HR 56 | Temp 97.9°F | Resp 18 | Ht 63.5 in | Wt 212.6 lb

## 2022-06-05 DIAGNOSIS — K3189 Other diseases of stomach and duodenum: Secondary | ICD-10-CM

## 2022-06-05 DIAGNOSIS — D373 Neoplasm of uncertain behavior of appendix: Secondary | ICD-10-CM | POA: Insufficient documentation

## 2022-06-05 DIAGNOSIS — Z809 Family history of malignant neoplasm, unspecified: Secondary | ICD-10-CM

## 2022-06-05 DIAGNOSIS — E1142 Type 2 diabetes mellitus with diabetic polyneuropathy: Secondary | ICD-10-CM

## 2022-06-05 DIAGNOSIS — R309 Painful micturition, unspecified: Secondary | ICD-10-CM | POA: Diagnosis not present

## 2022-06-05 DIAGNOSIS — K388 Other specified diseases of appendix: Secondary | ICD-10-CM | POA: Diagnosis not present

## 2022-06-05 DIAGNOSIS — K5730 Diverticulosis of large intestine without perforation or abscess without bleeding: Secondary | ICD-10-CM | POA: Diagnosis not present

## 2022-06-05 DIAGNOSIS — R339 Retention of urine, unspecified: Secondary | ICD-10-CM

## 2022-06-05 DIAGNOSIS — N2889 Other specified disorders of kidney and ureter: Secondary | ICD-10-CM | POA: Diagnosis not present

## 2022-06-05 DIAGNOSIS — Z90710 Acquired absence of both cervix and uterus: Secondary | ICD-10-CM

## 2022-06-05 DIAGNOSIS — K389 Disease of appendix, unspecified: Secondary | ICD-10-CM

## 2022-06-05 DIAGNOSIS — R1909 Other intra-abdominal and pelvic swelling, mass and lump: Secondary | ICD-10-CM | POA: Diagnosis not present

## 2022-06-05 NOTE — Progress Notes (Unsigned)
[place] [phone]   [phone]) [phone]  INITIAL CONSULTATION:  Patient Care Team: Patient, No Pcp Per as PCP - General (General Practice) Lorretta Harp, MD as PCP - Cardiology (Cardiology) Jinny Sanders, MD (Family Medicine)  CHIEF COMPLAINTS/PURPOSE OF CONSULTATION:  Left renal mass Dilated appendix  HISTORY OF PRESENTING ILLNESS:  [name] [name] 76 y.o. female with medical history significant for cirrhosis, diverticulitis, T2 diabetes, sleep apnea and polyneuropathy. She presents to the diagnostic clinic for evaluation for recent MRI and CT imaging concerning for left renal mass and possible appendiceal malignancy. She is accompanied by her daughters and son for this visit.  On review of the previous records, was evaluate by Ortho for low back pain.  MRI lumbar spine was obtained on 05/24/2022 that showed large heterogeneous lobulated left upper to midpole renal mass extending into renal sinus measuring at least up to 6.6 cm. CT abdomen/pelvis was obtained on 05/29/2022 that showed heterogeneously enhancing upper pole left renal 6.5 cm mass,consistent with renal cell carcinoma. Additionally, there is evidence of focally dilated appendix with appendicolith within, gastric wall thickening and extensive colonic diverticulosis with interstitial thickening the left hemipelvis.   On exam today, [name] [name] reports having lower abdominal pain, mainly on the left side. She reports chronic diarrhea. She reports incomplete bladder emptying and some pain with urination. She reports her energy levels are overall stable. She is eating well without any noticeable weight loss. She reports intermittent episodes of palpitations. She denies fevers, chills, sweats, shortness of breath, chest pain or cough.   MEDICAL HISTORY:  Past Medical History:  Diagnosis Date   Allergic rhinitis, cause unspecified    Arthritis    hands, feet and back    Benign neoplasm of colon    Complication of anesthesia    " i WAKE UP WITH TREMORS "   Cyst and pseudocyst of pancreas    Depressive disorder, not elsewhere classified    Diverticulitis    Diverticulosis of colon (without mention of hemorrhage)    Esophageal reflux    Essential and other specified forms of tremor    Irritable bowel syndrome    Obesity, unspecified    Other specified cardiac dysrhythmias(427.89)    Periapical abscess without sinus    Polyneuropathy in diabetes(357.2)    Pure hypercholesterolemia    Sleep apnea    CPAP- non compliant   Type II or unspecified type diabetes mellitus with neurological manifestations, not stated as uncontrolled(250.60)     SURGICAL HISTORY: Past Surgical History:  Procedure Laterality Date   ABDOMINAL HYSTERECTOMY     CALCANEAL OSTEOTOMY Right 07/27/2015   Procedure: RIGHT CALCANEAL OSTEOTOMY ;  Surgeon: Wylene Simmer, MD;  Location: [place];  Service: Orthopedics;  Laterality: Right;   CHOLECYSTECTOMY     15-20 years ago   COLONOSCOPY WITH PROPOFOL N/A 08/17/2019   Procedure: COLONOSCOPY WITH PROPOFOL;  Surgeon: Carol Ada, MD;  Location: [place];  Service: Endoscopy;  Laterality: N/A;   ESOPHAGOGASTRODUODENOSCOPY (EGD) WITH PROPOFOL N/A 08/17/2019   Procedure: ESOPHAGOGASTRODUODENOSCOPY (EGD) WITH PROPOFOL;  Surgeon: Carol Ada, MD;  Location: [place];  Service: Endoscopy;  Laterality: N/A;   GASTROCNEMIUS RECESSION Right 07/27/2015   Procedure: RIGHT GASTROC RECESSION;  Surgeon: Wylene Simmer, MD;  Location: [place];  Service: Orthopedics;  Laterality: Right;   KIDNEY STONE SURGERY  1980's   removal   LITHOTRIPSY     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   PARTIAL HYSTERECTOMY  1990's  POLYPECTOMY  08/17/2019   Procedure: POLYPECTOMY;  Surgeon: Carol Ada, MD;  Location: [facility] ENDOSCOPY;  Service: Endoscopy;;   ROTATOR CUFF REPAIR  2005    SOCIAL HISTORY: Social History   Socioeconomic History    Marital status: Widowed    Spouse name: Not on file   Number of children: 5   Years of education: Not on file   Highest education level: Not on file  Occupational History    Employer: RETIRED  Tobacco Use   Smoking status: Never   Smokeless tobacco: Never  Vaping Use   Vaping Use: Never used  Substance and Sexual Activity   Alcohol use: No    Alcohol/week: 0.0 standard drinks of alcohol   Drug use: No   Sexual activity: Not on file  Other Topics Concern   Not on file  Social History Narrative   Husband treated for testicular cancer, 2 cups coffee a day, no exercise   Social Determinants of Health   Financial Resource Strain: Low Risk  (01/14/2022)   Overall Financial Resource Strain (CARDIA)    Difficulty of Paying Living Expenses: Not very hard  Food Insecurity: No Food Insecurity (01/14/2022)   Hunger Vital Sign    Worried About Running Out of Food in the Last Year: Never true    Ran Out of Food in the Last Year: Never true  Transportation Needs: No Transportation Needs (01/14/2022)   PRAPARE - Hydrologist (Medical): No    Lack of Transportation (Non-Medical): No  Physical Activity: Inactive (01/14/2022)   Exercise Vital Sign    Days of Exercise per Week: 0 days    Minutes of Exercise per Session: 0 min  Stress: No Stress Concern Present (01/14/2022)   [place]    Feeling of Stress : Only a little  Social Connections: Not on file  Intimate Partner Violence: Not on file    FAMILY HISTORY: Family History  Problem Relation Age of Onset   Clotting disorder Mother    Cirrhosis Father    Diabetes Other        2 aunts   Cancer Other        grandmother   Allergies Other        "whole family"   Asthma Other        "whole family"    ALLERGIES:  is allergic to doxycycline, epinephrine hcl, erythromycin ethylsuccinate, iohexol, metformin, oxycodone, and tape.  MEDICATIONS:   Current Outpatient Medications  Medication Sig Dispense Refill   albuterol (PROAIR HFA) 108 (90 Base) MCG/ACT inhaler Inhale 1-2 puffs into the lungs every 6 (six) hours as needed for wheezing or shortness of breath. 1 each 2   atenolol (TENORMIN) 25 MG tablet Take 1 tablet by mouth in the morning and 1/2 tablet by mouth at night as needed     atenolol (TENORMIN) 25 MG tablet TAKE 1/2 TO 1 TABLET BY MOUTH 2 (TWO) TIMES DAILY. (Patient taking differently: 25 mg 2 (two) times daily.) 180 tablet 0   azaTHIOprine (IMURAN) 50 MG tablet Take 50 mg by mouth daily.     B Complex Vitamins (VITAMIN B COMPLEX) TABS See admin instructions.     cetirizine (ZYRTEC) 10 MG tablet Take 10 mg by mouth at bedtime.     co-enzyme Q-10 30 MG capsule Take 100 mg by mouth daily.     Continuous Blood Gluc Receiver (FREESTYLE LIBRE 2 READER) DEVI See admin instructions.  Continuous Blood Gluc Sensor (FREESTYLE LIBRE 2 SENSOR) MISC See admin instructions.     fluticasone (FLONASE) 50 MCG/ACT nasal spray Place 1 spray into both nostrils daily.     furosemide (LASIX) 40 MG tablet Take 40 mg by mouth 2 (two) times daily.     glucose blood test strip OneTouch Ultra Blue Test Strip  TEST 3 TIMES DAILY E11.65     insulin NPH Human (NOVOLIN N) 100 UNIT/ML injection 40u     insulin regular (NOVOLIN R) 100 units/mL injection 35U     Lactobacillus Rhamnosus, GG, (CULTURELLE PO) Take by mouth.     Magnesium 300 MG CAPS 1 capsule with a meal     pantoprazole (PROTONIX) 40 MG tablet TAKE 1 TABLET BY MOUTH EVERY DAY 30 tablet 0   predniSONE (DELTASONE) 10 MG tablet Take 10 mg by mouth daily with breakfast.     Turmeric (QC TUMERIC COMPLEX PO) Take by mouth.     UNABLE TO FIND Med Name: Heide Scales Vitamin     zinc gluconate 50 MG tablet Take 50 mg by mouth daily.     amoxicillin-clavulanate (AUGMENTIN) 875-125 MG tablet Take 1 tablet by mouth 2 (two) times daily. (Patient not taking: Reported on 06/05/2022) 20 tablet 0    sulfamethoxazole-trimethoprim (BACTRIM DS) 800-160 MG tablet Take 1 tablet by mouth 2 (two) times daily. (Patient not taking: Reported on 01/14/2022) 6 tablet 0   No current facility-administered medications for this visit.    REVIEW OF SYSTEMS:   Constitutional: ( - ) fevers, ( - )  chills , ( - ) night sweats Eyes: ( - ) blurriness of vision, ( - ) double vision, ( - ) watery eyes Ears, nose, mouth, throat, and face: ( - ) mucositis, ( - ) sore throat Respiratory: ( - ) cough, ( - ) dyspnea, ( - ) wheezes Cardiovascular: ( - ) palpitation, ( - ) chest discomfort, ( - ) lower extremity swelling Gastrointestinal:  ( - ) nausea, ( - ) heartburn, ( - ) change in bowel habits Skin: ( - ) abnormal skin rashes Lymphatics: ( - ) new lymphadenopathy, ( - ) easy bruising Neurological: ( - ) numbness, ( - ) tingling, ( - ) new weaknesses Behavioral/Psych: ( - ) mood change, ( - ) new changes  All other systems were reviewed with the patient and are negative.  PHYSICAL EXAMINATION: ECOG PERFORMANCE STATUS: 1 - Symptomatic but completely ambulatory  Vitals:   06/05/22 1528  BP: (!) 156/72  Pulse: (!) 56  Resp: 18  Temp: 97.9 F (36.6 C)  SpO2: 96%   Filed Weights   06/05/22 1528  Weight: 212 lb 9.6 oz (96.4 kg)    GENERAL: well appearing female in NAD  SKIN: skin color, texture, turgor are normal, no rashes or significant lesions EYES: conjunctiva are pink and non-injected, sclera clear OROPHARYNX: no exudate, no erythema; lips, buccal mucosa, and tongue normal  NECK: supple, non-tender LYMPH:  no palpable lymphadenopathy in the cervical or supraclavicular lymph nodes.  LUNGS: clear to auscultation and percussion with normal breathing effort HEART: regular rate & rhythm and no murmurs and no lower extremity edema ABDOMEN: soft,  non-distended, normal bowel sounds. Tenderness to palpation in left side. No guarding.  Musculoskeletal: no cyanosis of digits and no clubbing  PSYCH: alert  & oriented x 3, fluent speech NEURO: no focal motor/sensory deficits  LABORATORY DATA:  I have reviewed the data as listed    Latest Ref Rng &  Units 05/17/2020    2:59 AM 08/18/2019    8:32 AM 08/17/2019   12:07 PM  CBC  WBC 4.0 - 10.5 K/uL 6.4  5.8  4.4   Hemoglobin 12.0 - 15.0 g/dL 12.7  10.0  9.3   Hematocrit 36.0 - 46.0 % 38.8  31.3  28.4   Platelets 150 - 400 K/uL 158  150  123        Latest Ref Rng & Units 05/17/2020    2:59 AM 08/18/2019    8:32 AM 08/17/2019    6:20 AM  CMP  Glucose 70 - 99 mg/dL 117  347  146   BUN 8 - 23 mg/dL 23  13  15    Creatinine 0.44 - 1.00 mg/dL 0.91  1.10  0.93   Sodium 135 - 145 mmol/L 139  138  144   Potassium 3.5 - 5.1 mmol/L 3.3  4.3  4.1   Chloride 98 - 111 mmol/L 104  106  110   CO2 22 - 32 mmol/L 27  22  26    Calcium 8.9 - 10.3 mg/dL 9.4  8.9  8.6   Total Protein 6.5 - 8.1 g/dL  5.5    Total Bilirubin 0.3 - 1.2 mg/dL  0.9    Alkaline Phos 38 - 126 U/L  78    AST 15 - 41 U/L  39    ALT 0 - 44 U/L  27       RADIOGRAPHIC STUDIES: I have personally reviewed the radiological images as listed and agreed with the findings in the report. CT ABDOMEN PELVIS W WO CONTRAST  Result Date: 05/29/2022 CLINICAL DATA:  Back pain. Renal mass on MRI. History of left renal surgery. EMR describes prior lithotripsy in the 90s. * Tracking Code: BO * EXAM: CT ABDOMEN AND PELVIS WITHOUT AND WITH CONTRAST TECHNIQUE: Multidetector CT imaging of the abdomen and pelvis was performed following the standard protocol before and following the bolus administration of intravenous contrast. RADIATION DOSE REDUCTION: This exam was performed according to the departmental dose-optimization program which includes automated exposure control, adjustment of the mA and/or kV according to patient size and/or use of iterative reconstruction technique. CONTRAST:  159m ISOVUE-300 IOPAMIDOL (ISOVUE-300) INJECTION 61% COMPARISON:  04/06/2015 noncontrast chest abdomen CTs. FINDINGS:  Lower chest: Calcified left lower lobe nodules are likely related to old granulomatous disease. Mucoid impaction within the right greater than left lower lobes, including at up to 1.3 cm medially in the right lower lobe on 21/5. This is similar on 04/06/2015. Mild bibasilar cylindrical bronchiectasis also identified. Mild cardiomegaly, without pericardial or pleural effusion. Hepatobiliary: Moderate to marked cirrhosis, with an irregular hepatic capsule. Multiple arterial phase hyperenhancing foci including up to 6 mm in the right hepatic lobe on 41/6. None of these demonstrate correlate abnormality or hypoenhancement on nephrographic phase imaging. Cholecystectomy, without biliary ductal dilatation. Pancreas: Ventral pancreatic head cystic lesion of 12 mm on 39/8 was present 04/06/2015, most consistent with a benign cyst or pseudocyst. A pancreatic tail 7 mm cystic lesion on 35/6 is not readily apparent on the prior. No duct dilatation or acute inflammation. Spleen: Normal in size, without focal abnormality. Adrenals/Urinary Tract: Normal adrenal glands. No renal calculi or hydronephrosis. No abdominal ureteric stone. Normal urinary bladder. Heterogeneously enhancing, partially hypervascular mass within the medial upper pole left kidney measures 6.5 x 5.6 x 5.1 cm on 19/13 and coronal image 80/14. Stomach/Bowel: The proximal stomach is underdistended, but appears thick walled including on 28/6. Extensive colonic diverticulosis.  Interstitial thickening adjacent the sigmoid including on 112/6. Normal terminal ileum. Dominant appendicolith within a focally dilated appendix of up to 1.9 cm on 87/6. No surrounding inflammation. Normal small bowel. Vascular/Lymphatic: Aortic atherosclerosis. Single renal arteries. Patent renal veins. Recannulized paraumbilical vein. No abdominopelvic adenopathy. Reproductive: Hysterectomy.  No adnexal mass. Other: Moderate pelvic floor laxity. No significant free fluid. No free  intraperitoneal air. Musculoskeletal: Presumed bone islands in the left acetabulum. Lumbosacral spondylosis. Convex left lumbar spine curvature. IMPRESSION: 1. Heterogeneously enhancing upper pole left renal 6.5 cm mass, consistent with renal cell carcinoma. No renal vein involvement or evidence of abdominal metastatic disease. 2. Advanced cirrhosis. Foci of arterial hyperenhancement are likely perfusion anomalies but considered LR 3. Consider pre and post contrast abdominal MRI follow-up at 6 months. 3. Focally dilated appendix with appendicolith within. Mucocele secondary to mucinous adenoma or adenocarcinoma could have this appearance. Consider correlation with colonoscopy and attention to the appendiceal orifice. 4. Gastric wall thickening, likely accentuated by underdistention. Correlate with symptoms of gastritis or other gastric pathology. Consider endoscopy. 5. Extensive colonic diverticulosis with interstitial thickening in the left hemipelvis. Suspicious for acute uncomplicated diverticulitis. 6. 2 pancreatic cystic lesions. The larger has been present back to 2016 and can be presumed incidental/benign. A smaller lesion is also most likely benign but not readily apparent on the prior. This can be re-evaluated on follow-up MRI. 7.  Aortic Atherosclerosis (ICD10-I70.0). Electronically Signed   By: Abigail Miyamoto M.D.   On: 05/29/2022 11:11    ASSESSMENT & PLAN [name] [name] is a 76 y.o. female who presents to the diagnostic clinic for recent CT abdomen/pelvis concerning for left renal mass and focally dilated appendix.   #Large left renal mass: --We reviewed CT abdomen/pelvis from 05/29/2022 and findings are concerning for renal cell carcinoma.  --She is scheduled to undergo evaluation with Dr. Lovena Neighbours from [facility] tomorrow to discuss surgical interventions.   #Focally dilated appendix: --Differentials include mucinous adenoma versus malignancy --We will request endoscopic evaluation with  Dr. Carol Ada.  --We will check tumor markers including CEA and CA125.  --If there is evidence of malignancy, we will request consultation with GI surgeon.  #Gastric wall thickening: --Seen on CT scan from 05/29/2022 --We will request EGD evaluation with Dr. Carol Ada  #Colonic diverticulosis with interstitial thickening in left hemipelvis: --Seen on CT scan from 05/29/2022 --Patient was prescribed Augmentin by PCP today, plan to start later today.   No orders of the defined types were placed in this encounter.   All questions were answered. The patient knows to call the clinic with any problems, questions or concerns.  I have spent a total of 60 minutes minutes of face-to-face and non-face-to-face time, preparing to see the patient, obtaining and/or reviewing separately obtained history, performing a medically appropriate examination, counseling and educating the patient, ordering tests/procedures, referring and communicating with other health care professionals, documenting clinical information in the electronic health record,  and care coordination.   Dede Query, PA-C Department of Hematology/Oncology [place] at [facility] Phone: [phone]  Patient was seen with Dr. Lorenso Courier.   I have read the above note and personally examined the patient. I agree with the assessment and plan as noted above.  Briefly Mrs. Asher is a 76 year old female who presents for evaluation of numerous findings on CT scan that were discovered incidentally after a fall.  She underwent an MRI of the spine which incidentally showed a renal mass.  She subsequently underwent a CT abdomen  pelvis on 05/29/2022 which showed a 6.5 cm left renal mass consistent with RCC.  Additionally there was noted to be a dilated appendix with concern for possible mucinous adenoma/adenocarcinoma.  Due to concern for these findings she was referred to diagnostic clinic for further evaluation and  management.  She has a urology visit scheduled tomorrow with Dr. Gilford Rile.  She will also require evaluation by surgery for consideration of resection of the appendiceal mass.  Would recommend collaborative effort with surgery and urology to determine if these procedures could potentially be performed simultaneously.  If not we will need to consider sequencing of the resections.  The patient and her daughter voiced understanding of the plan moving forward.   Ledell Peoples, MD Department of Hematology/Oncology [place] at [facility] Phone: [phone] Pager: [phone] Email: [email or web address]

## 2022-06-06 ENCOUNTER — Other Ambulatory Visit: Payer: Self-pay | Admitting: Physician Assistant

## 2022-06-06 ENCOUNTER — Telehealth: Payer: Self-pay

## 2022-06-06 ENCOUNTER — Telehealth: Payer: Self-pay | Admitting: Physician Assistant

## 2022-06-06 ENCOUNTER — Inpatient Hospital Stay: Payer: PPO

## 2022-06-06 ENCOUNTER — Other Ambulatory Visit: Payer: Self-pay

## 2022-06-06 DIAGNOSIS — K389 Disease of appendix, unspecified: Secondary | ICD-10-CM

## 2022-06-06 DIAGNOSIS — N2889 Other specified disorders of kidney and ureter: Secondary | ICD-10-CM | POA: Diagnosis not present

## 2022-06-06 LAB — CMP (CANCER CENTER ONLY)
ALT: 25 U/L (ref 0–44)
AST: 34 U/L (ref 15–41)
Albumin: 3.4 g/dL — ABNORMAL LOW (ref 3.5–5.0)
Alkaline Phosphatase: 202 U/L — ABNORMAL HIGH (ref 38–126)
Anion gap: 5 (ref 5–15)
BUN: 22 mg/dL (ref 8–23)
CO2: 32 mmol/L (ref 22–32)
Calcium: 9.3 mg/dL (ref 8.9–10.3)
Chloride: 104 mmol/L (ref 98–111)
Creatinine: 0.81 mg/dL (ref 0.44–1.00)
GFR, Estimated: >60 mL/min (ref >60)
Glucose, Bld: 256 mg/dL — ABNORMAL HIGH (ref 70–99)
Potassium: 4.5 mmol/L (ref 3.5–5.1)
Sodium: 141 mmol/L (ref 135–145)
Total Bilirubin: 1.3 mg/dL — ABNORMAL HIGH (ref 0.3–1.2)
Total Protein: 6.5 g/dL (ref 6.5–8.1)

## 2022-06-06 LAB — CBC WITH DIFFERENTIAL (CANCER CENTER ONLY)
Abs Immature Granulocytes: 0.13 10*3/uL — ABNORMAL HIGH (ref 0.00–0.07)
Basophils Absolute: 0 10*3/uL (ref 0.0–0.1)
Basophils Relative: 0 %
Eosinophils Absolute: 0 10*3/uL (ref 0.0–0.5)
Eosinophils Relative: 0 %
HCT: 38.4 % (ref 36.0–46.0)
Hemoglobin: 12.3 g/dL (ref 12.0–15.0)
Immature Granulocytes: 1 %
Lymphocytes Relative: 9 %
Lymphs Abs: 0.9 10*3/uL (ref 0.7–4.0)
MCH: 30.2 pg (ref 26.0–34.0)
MCHC: 32 g/dL (ref 30.0–36.0)
MCV: 94.3 fL (ref 80.0–100.0)
Monocytes Absolute: 0.7 10*3/uL (ref 0.1–1.0)
Monocytes Relative: 8 %
Neutro Abs: 7.7 10*3/uL (ref 1.7–7.7)
Neutrophils Relative %: 82 %
Platelet Count: 178 10*3/uL (ref 150–400)
RBC: 4.07 MIL/uL (ref 3.87–5.11)
RDW: 14.4 % (ref 11.5–15.5)
WBC Count: 9.5 10*3/uL (ref 4.0–10.5)
nRBC: 0 % (ref 0.0–0.2)

## 2022-06-06 LAB — CEA (ACCESS): CEA (CHCC): 9.85 ng/mL — ABNORMAL HIGH (ref 0.00–5.00)

## 2022-06-06 NOTE — Telephone Encounter (Signed)
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 Leighton Ruff or Dr. Michaelle Birks   STAT referral faxed and confirmation received

## 2022-06-06 NOTE — Telephone Encounter (Signed)
Per 11/16 secure chat called and left message about lab appointment for today

## 2022-06-06 NOTE — Progress Notes (Signed)
g

## 2022-06-07 ENCOUNTER — Other Ambulatory Visit: Payer: Self-pay | Admitting: Physician Assistant

## 2022-06-07 DIAGNOSIS — N2889 Other specified disorders of kidney and ureter: Secondary | ICD-10-CM

## 2022-06-07 LAB — CA 125: Cancer Antigen (CA) 125: 13.5 U/mL (ref 0.0–38.1)

## 2022-06-11 ENCOUNTER — Telehealth: Payer: Self-pay | Admitting: Cardiovascular Disease

## 2022-06-11 NOTE — Telephone Encounter (Signed)
    Primary Cardiologist:Jonathan Gwenlyn Found, MD  Chart reviewed as part of pre-operative protocol coverage. Because of Markeshia Brannan's past medical history and time since last visit, he/she will require a follow-up visit in order to better assess preoperative cardiovascular risk.  Pre-op covering staff: - Please schedule appointment and call patient to inform them. - Please contact requesting surgeon's office via preferred method (i.e, phone, fax) to inform them of need for appointment prior to surgery.  If applicable, this message will also be routed to pharmacy pool and/or primary cardiologist for input on holding anticoagulant/antiplatelet agent as requested below so that this information is available at time of patient's appointment.   Deberah Pelton, NP  06/11/2022, 3:37 PM

## 2022-06-11 NOTE — Telephone Encounter (Signed)
   Pre-operative Risk Assessment    Patient Name: [name] [name]  DOB: September 17, 1945 MRN: [id number]      Request for Surgical Clearance    Procedure:   Left Kidney Removal   Date of Surgery:  Clearance TBD                                 Surgeon:  Dr. Lovena Neighbours  Surgeon's Group or Practice Name:  [facility]  Phone number:  [phone] Fax number:  [phone]   Type of Clearance Requested:   - Medical    Type of Anesthesia:  General    Additional requests/questions:    Dorthey Sawyer   06/11/2022, 3:25 PM

## 2022-06-11 NOTE — Telephone Encounter (Signed)
I s/w the pt and she has been scheduled to see Almyra Deforest, [facility] 07/04/22 at 1:55, pt asked to see Almyra Deforest, PAC. Pt thanked me for the help today.

## 2022-06-12 ENCOUNTER — Other Ambulatory Visit: Payer: Self-pay | Admitting: *Deleted

## 2022-06-12 MED ORDER — DIPHENHYDRAMINE HCL 50 MG PO TABS: 50.0000 mg | ORAL_TABLET | Freq: Once | ORAL | 0 refills | Status: DC

## 2022-06-12 MED ORDER — PREDNISONE 50 MG PO TABS: ORAL_TABLET | ORAL | 0 refills | Status: DC

## 2022-06-17 ENCOUNTER — Ambulatory Visit (HOSPITAL_COMMUNITY)
Admission: RE | Admit: 2022-06-17 | Discharge: 2022-06-17 | Disposition: A | Discharge status: Home / Self Care | Payer: HMO | Source: Ambulatory Visit | Attending: Physician Assistant | Admitting: Physician Assistant

## 2022-06-17 DIAGNOSIS — K7460 Unspecified cirrhosis of liver: Secondary | ICD-10-CM | POA: Insufficient documentation

## 2022-06-17 DIAGNOSIS — I700 Atherosclerosis of aorta: Secondary | ICD-10-CM | POA: Diagnosis not present

## 2022-06-17 DIAGNOSIS — N2889 Other specified disorders of kidney and ureter: Secondary | ICD-10-CM | POA: Insufficient documentation

## 2022-06-17 DIAGNOSIS — R918 Other nonspecific abnormal finding of lung field: Secondary | ICD-10-CM | POA: Diagnosis not present

## 2022-06-17 MED ORDER — IOHEXOL 300 MG/ML  SOLN
100.0000 mL | Freq: Once | INTRAMUSCULAR | Status: AC | PRN
  Administered 2022-06-17: 75 mL via INTRAVENOUS

## 2022-06-18 ENCOUNTER — Other Ambulatory Visit: Payer: Self-pay | Admitting: Gastroenterology

## 2022-06-19 ENCOUNTER — Encounter (HOSPITAL_COMMUNITY): Payer: Self-pay | Admitting: Gastroenterology

## 2022-06-20 NOTE — Anesthesia Preprocedure Evaluation (Addendum)
Anesthesia Evaluation  Patient identified by MRN, date of birth, ID band Patient awake    Reviewed: Allergy & Precautions, NPO status , Patient's Chart, lab work & pertinent test results, reviewed documented beta blocker date and time   History of Anesthesia Complications Negative for: history of anesthetic complications  Airway Mallampati: II  TM Distance: >3 FB Neck ROM: Full    Dental  (+) Caps, Dental Advisory Given   Pulmonary asthma , sleep apnea (pt does not use CPAP) , COPD,  COPD inhaler   breath sounds clear to auscultation       Cardiovascular hypertension, Pt. on medications and Pt. on home beta blockers (-) angina  Rhythm:Regular Rate:Normal  '20 Stress: no reversible ischemia, no infarct, EF 82%   Neuro/Psych   Anxiety Depression    tremor    GI/Hepatic ,GERD  Medicated and Controlled,,(+) Cirrhosis  (NA[facility])      , Hepatitis -Pancreatic pseudocyst   Endo/Other  diabetes (glu 162), Insulin Dependent  BMI 36  Renal/GU negative Renal ROS     Musculoskeletal   Abdominal  (+) + obese  Peds  Hematology   Anesthesia Other Findings   Reproductive/Obstetrics                              Anesthesia Physical Anesthesia Plan  ASA: 3  Anesthesia Plan: MAC   Post-op Pain Management: Minimal or no pain anticipated   Induction:   PONV Risk Score and Plan: 2 and Treatment may vary due to age or medical condition  Airway Management Planned: Natural Airway and Simple Face Mask  Additional Equipment: None  Intra-op Plan:   Post-operative Plan:   Informed Consent: I have reviewed the patients History and Physical, chart, labs and discussed the procedure including the risks, benefits and alternatives for the proposed anesthesia with the patient or authorized representative who has indicated his/her understanding and acceptance.     Dental advisory given  Plan Discussed with:  CRNA and Surgeon  Anesthesia Plan Comments:         Anesthesia Quick Evaluation

## 2022-06-21 ENCOUNTER — Encounter (HOSPITAL_COMMUNITY)
Admission: RE | Disposition: A | Discharge status: Home / Self Care | Payer: Self-pay | Source: Home / Self Care | Attending: Gastroenterology

## 2022-06-21 ENCOUNTER — Ambulatory Visit (HOSPITAL_COMMUNITY): Payer: PPO | Admitting: Physician Assistant

## 2022-06-21 ENCOUNTER — Other Ambulatory Visit: Payer: Self-pay

## 2022-06-21 ENCOUNTER — Encounter (HOSPITAL_COMMUNITY): Payer: Self-pay | Admitting: Gastroenterology

## 2022-06-21 ENCOUNTER — Ambulatory Visit (HOSPITAL_COMMUNITY)
Admission: RE | Admit: 2022-06-21 | Discharge: 2022-06-21 | Disposition: A | Discharge status: Home / Self Care | Payer: PPO | Attending: Gastroenterology | Admitting: Gastroenterology

## 2022-06-21 ENCOUNTER — Ambulatory Visit (HOSPITAL_BASED_OUTPATIENT_CLINIC_OR_DEPARTMENT_OTHER): Payer: PPO | Admitting: Physician Assistant

## 2022-06-21 DIAGNOSIS — G4730 Sleep apnea, unspecified: Secondary | ICD-10-CM | POA: Diagnosis not present

## 2022-06-21 DIAGNOSIS — K7581 Nonalcoholic steatohepatitis (NASH): Secondary | ICD-10-CM | POA: Insufficient documentation

## 2022-06-21 DIAGNOSIS — Z1211 Encounter for screening for malignant neoplasm of colon: Secondary | ICD-10-CM | POA: Insufficient documentation

## 2022-06-21 DIAGNOSIS — Z86010 Personal history of colonic polyps: Secondary | ICD-10-CM

## 2022-06-21 DIAGNOSIS — J449 Chronic obstructive pulmonary disease, unspecified: Secondary | ICD-10-CM | POA: Diagnosis not present

## 2022-06-21 DIAGNOSIS — R251 Tremor, unspecified: Secondary | ICD-10-CM | POA: Insufficient documentation

## 2022-06-21 DIAGNOSIS — D125 Benign neoplasm of sigmoid colon: Secondary | ICD-10-CM | POA: Diagnosis not present

## 2022-06-21 DIAGNOSIS — K5730 Diverticulosis of large intestine without perforation or abscess without bleeding: Secondary | ICD-10-CM | POA: Insufficient documentation

## 2022-06-21 DIAGNOSIS — F32A Depression, unspecified: Secondary | ICD-10-CM | POA: Diagnosis not present

## 2022-06-21 DIAGNOSIS — D123 Benign neoplasm of transverse colon: Secondary | ICD-10-CM | POA: Insufficient documentation

## 2022-06-21 DIAGNOSIS — I10 Essential (primary) hypertension: Secondary | ICD-10-CM | POA: Diagnosis not present

## 2022-06-21 DIAGNOSIS — E119 Type 2 diabetes mellitus without complications: Secondary | ICD-10-CM | POA: Insufficient documentation

## 2022-06-21 DIAGNOSIS — D120 Benign neoplasm of cecum: Secondary | ICD-10-CM | POA: Diagnosis not present

## 2022-06-21 DIAGNOSIS — Z794 Long term (current) use of insulin: Secondary | ICD-10-CM | POA: Insufficient documentation

## 2022-06-21 DIAGNOSIS — E1142 Type 2 diabetes mellitus with diabetic polyneuropathy: Secondary | ICD-10-CM | POA: Insufficient documentation

## 2022-06-21 DIAGNOSIS — Z6836 Body mass index (BMI) 36.0-36.9, adult: Secondary | ICD-10-CM | POA: Insufficient documentation

## 2022-06-21 DIAGNOSIS — K219 Gastro-esophageal reflux disease without esophagitis: Secondary | ICD-10-CM | POA: Diagnosis not present

## 2022-06-21 DIAGNOSIS — F419 Anxiety disorder, unspecified: Secondary | ICD-10-CM | POA: Diagnosis not present

## 2022-06-21 DIAGNOSIS — E669 Obesity, unspecified: Secondary | ICD-10-CM | POA: Diagnosis not present

## 2022-06-21 HISTORY — PX: POLYPECTOMY: SHX5525

## 2022-06-21 HISTORY — PX: COLONOSCOPY WITH PROPOFOL: SHX5780

## 2022-06-21 LAB — GLUCOSE, CAPILLARY: Glucose-Capillary: 162 mg/dL — ABNORMAL HIGH (ref 70–99)

## 2022-06-21 SURGERY — COLONOSCOPY WITH PROPOFOL
Anesthesia: Monitor Anesthesia Care

## 2022-06-21 MED ORDER — SODIUM CHLORIDE 0.9 % IV SOLN: INTRAVENOUS | Status: DC

## 2022-06-21 MED ORDER — LACTATED RINGERS IV SOLN: INTRAVENOUS | Status: DC

## 2022-06-21 MED ORDER — PROPOFOL 500 MG/50ML IV EMUL
INTRAVENOUS | Status: DC | PRN
  Administered 2022-06-21: 125 ug/kg/min via INTRAVENOUS

## 2022-06-21 SURGICAL SUPPLY — 22 items

## 2022-06-21 NOTE — H&P (Signed)
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  She has a history of colonic polyps and this year was the routine year for follow up.  A repeat colonoscopy will be performed to prepare her for surgery.   Past Medical History:  Diagnosis Date   Allergic rhinitis, cause unspecified    Arthritis    hands, feet and back   Benign neoplasm of colon    Complication of anesthesia    " i WAKE UP WITH TREMORS "   Cyst and pseudocyst of pancreas    Depressive disorder, not elsewhere classified    Diverticulitis    Diverticulosis of colon (without mention of hemorrhage)    Esophageal reflux    Essential and other specified forms of tremor    Irritable bowel syndrome    Obesity, unspecified    Other specified cardiac dysrhythmias(427.89)    Periapical abscess without sinus    Polyneuropathy in diabetes(357.2)    Pure hypercholesterolemia    Sleep apnea    CPAP- non compliant   Type II or unspecified type diabetes mellitus with neurological manifestations, not stated as uncontrolled(250.60)     Past Surgical History:  Procedure Laterality Date   ABDOMINAL HYSTERECTOMY     CALCANEAL OSTEOTOMY Right 07/27/2015   Procedure: RIGHT CALCANEAL OSTEOTOMY ;  Surgeon: Wylene Simmer, MD;  Location: [place];  Service: Orthopedics;  Laterality: Right;   CHOLECYSTECTOMY     15-20 years ago   COLONOSCOPY WITH PROPOFOL N/A 08/17/2019   Procedure: COLONOSCOPY WITH PROPOFOL;  Surgeon: Carol Ada, MD;  Location: [place];  Service: Endoscopy;  Laterality: N/A;   ESOPHAGOGASTRODUODENOSCOPY (EGD) WITH PROPOFOL N/A 08/17/2019   Procedure: ESOPHAGOGASTRODUODENOSCOPY (EGD) WITH PROPOFOL;  Surgeon: Carol Ada, MD;  Location: [place];  Service: Endoscopy;  Laterality: N/A;   GASTROCNEMIUS RECESSION Right 07/27/2015   Procedure: RIGHT GASTROC RECESSION;  Surgeon: Wylene Simmer, MD;  Location: [place];   Service: Orthopedics;  Laterality: Right;   KIDNEY STONE SURGERY  1980's   removal   LITHOTRIPSY     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 She has never used smokeless tobacco. She reports that she does not drink alcohol and does not use drugs.  Allergies:  Allergies  Allergen Reactions   Doxycycline Hives   Epinephrine Hcl Other (See Comments)    REACTION: Had tremors when it was given.   Erythromycin Ethylsuccinate Other (See Comments)    REACTION: abd spasms   Iohexol Other (See Comments)     Code: HIVES, Desc: pt needs premedicated-- hives on prev contrast study per md office    Metformin Other (See Comments)   Oxycodone Other (See Comments)    "felt like I was dying"   Tape Rash    Medications:  Scheduled: Continuous:  sodium chloride     lactated ringers 10 mL/hr at 06/21/22 0753    Results for orders placed or performed during the hospital encounter of 06/21/22 (from the past 24 hour(s))  Glucose, capillary     Status: Abnormal   Collection Time: 06/21/22  7:56 AM  Result Value Ref Range   Glucose-Capillary 162 (H) 70 - 99 mg/dL     No results found.  ROS:  As stated above in the HPI otherwise negative.  Blood pressure (!) 181/59, pulse 85, temperature 98.4 F (36.9 C), temperature source Temporal, resp. rate 17, height 5' 3"  (1.6 m), weight 93 kg, SpO2 97 %.    PE: Gen: NAD, Alert and Oriented HEENT:  [name]/AT, EOMI Neck: Supple, no LAD Lungs: CTA Bilaterally CV: RRR without M/G/R ABD: Soft,  NTND, +BS Ext: No C/C/E  Assessment/Plan: 1) Personal history of polyps - colonoscopy. 2) Abnormal CT scan.  Delane Stalling D 06/21/2022, 8:03 AM

## 2022-06-21 NOTE — Op Note (Signed)
[facility] Patient Name: [name] [name] Procedure Date: 06/21/2022 MRN: [id number] Attending MD: Carol Ada , MD, [id number] Date of Birth: 1946/02/25 CSN: [id number] Age: 76 Admit Type: Outpatient Procedure:                Colonoscopy Indications:              High risk colon cancer surveillance: Personal                            history of colonic polyps Providers:                Carol Ada, MD, Mikey College, RN, Cletis Athens,                            Technician Referring MD:              Medicines:                Propofol per Anesthesia Complications:            No immediate complications. Estimated Blood Loss:     Estimated blood loss: none. Procedure:                Pre-Anesthesia Assessment:                           - Prior to the procedure, a History and Physical                            was performed, and patient medications and                            allergies were reviewed. The patient's tolerance of                            previous anesthesia was also reviewed. The risks                            and benefits of the procedure and the sedation                            options and risks were discussed with the patient.                            All questions were answered, and informed consent                            was obtained. Prior Anticoagulants: The patient has                            taken no anticoagulant or antiplatelet agents. ASA                            Grade Assessment: III - A patient with severe                            systemic  disease. After reviewing the risks and                            benefits, the patient was deemed in satisfactory                            condition to undergo the procedure.                           - Sedation was administered by an anesthesia                            professional. Deep sedation was attained.                           After obtaining informed consent, the  colonoscope                            was passed under direct vision. Throughout the                            procedure, the patient's blood pressure, pulse, and                            oxygen saturations were monitored continuously. The                            CF-HQ190L ([id number] Olympus colonoscope was                            introduced through the anus and advanced to the the                            cecum, identified by appendiceal orifice and                            ileocecal valve. The colonoscopy was somewhat                            difficult due to a tortuous colon. Successful                            completion of the procedure was aided by using                            manual pressure and straightening and shortening                            the scope to obtain bowel loop reduction. The                            patient tolerated the procedure well. The quality  of the bowel preparation was evaluated using the                            BBPS [facility] Bowel Preparation Scale) with scores                            of: Right Colon = 2 (minor amount of residual                            staining, small fragments of stool and/or opaque                            liquid, but mucosa seen well), Transverse Colon = 2                            (minor amount of residual staining, small fragments                            of stool and/or opaque liquid, but mucosa seen                            well) and Left Colon = 2 (minor amount of residual                            staining, small fragments of stool and/or opaque                            liquid, but mucosa seen well). The total BBPS score                            equals 6. The quality of the bowel preparation was                            good. The ileocecal valve, appendiceal orifice, and                            rectum were photographed. Scope In: 8:25:03 AM Scope Out:  8:54:26 AM Total Procedure Duration: 0 hours 29 minutes 23 seconds  Findings:      Eight sessile polyps were found in the sigmoid colon, transverse colon       and cecum. The polyps were 2 to 5 mm in size. These polyps were removed       with a cold snare. Resection and retrieval were complete.      Scattered large-mouthed and small-mouthed diverticula were found in the       entire colon. Impression:               - Eight 2 to 5 mm polyps in the sigmoid colon, in                            the transverse colon and in the cecum, removed with  a cold snare. Resected and retrieved.                           - Diverticulosis in the entire examined colon. Moderate Sedation:      Not Applicable - Patient had care per Anesthesia. Recommendation:           - Patient has a contact number available for                            emergencies. The signs and symptoms of potential                            delayed complications were discussed with the                            patient. Return to normal activities tomorrow.                            Written discharge instructions were provided to the                            patient.                           - Resume previous diet.                           - Continue present medications.                           - Await pathology results.                           - Repeat colonoscopy in 3 years for surveillance,                            if clinically appropriate. Procedure Code(s):        --- Professional ---                           [phone], Colonoscopy, flexible; with removal of                            tumor(s), polyp(s), or other lesion(s) by snare                            technique Diagnosis Code(s):        --- Professional ---                           D12.5, Benign neoplasm of sigmoid colon                           Z86.010, Personal history of colonic polyps                           D12.3, Benign  neoplasm of transverse colon (hepatic                            flexure or splenic flexure)                           D12.0, Benign neoplasm of cecum                           K57.30, Diverticulosis of large intestine without                            perforation or abscess without bleeding CPT copyright 2022 American Medical Association. All rights reserved. The codes documented in this report are preliminary and upon coder review may  be revised to meet current compliance requirements. Carol Ada, MD Carol Ada, MD 06/21/2022 9:00:57 AM This report has been signed electronically. Number of Addenda: 0

## 2022-06-21 NOTE — Discharge Instructions (Signed)

## 2022-06-21 NOTE — Transfer of Care (Signed)
Immediate Anesthesia Transfer of Care Note  Patient: [name] [name]  Procedure(s) Performed: COLONOSCOPY WITH PROPOFOL POLYPECTOMY  Patient Location: PACU  Anesthesia Type:MAC  Level of Consciousness: sedated, patient cooperative, and responds to stimulation  Airway & Oxygen Therapy: Patient Spontanous Breathing and Patient connected to face mask oxygen  Post-op Assessment: Report given to RN and Post -op Vital signs reviewed and stable  Post vital signs: Reviewed and stable  Last Vitals:  Vitals Value Taken Time  BP 99/39 06/21/22 0900  Temp 36.3 C 06/21/22 0857  Pulse 58 06/21/22 0859  Resp 23 06/21/22 0902  SpO2 83 % 06/21/22 0859  Vitals shown include unvalidated device data.  Last Pain:  Vitals:   06/21/22 0857  TempSrc: Temporal  PainSc: 0-No pain         Complications: No notable events documented.

## 2022-06-21 NOTE — Anesthesia Postprocedure Evaluation (Signed)
Anesthesia Post Note  Patient: [name] [name]  Procedure(s) Performed: COLONOSCOPY WITH PROPOFOL POLYPECTOMY     Patient location during evaluation: Endoscopy Anesthesia Type: MAC Level of consciousness: awake and alert, patient cooperative and oriented Pain management: pain level controlled Vital Signs Assessment: post-procedure vital signs reviewed and stable Respiratory status: nonlabored ventilation, spontaneous breathing and respiratory function stable Cardiovascular status: blood pressure returned to baseline and stable Postop Assessment: no apparent nausea or vomiting and able to ambulate Anesthetic complications: no   No notable events documented.  Last Vitals:  Vitals:   06/21/22 0917 06/21/22 0927  BP: (!) 124/44 (!) 143/52  Pulse: 64 64  Resp: 12 16  Temp:    SpO2: 97% 97%    Last Pain:  Vitals:   06/21/22 0927  TempSrc:   PainSc: 0-No pain                 Melvenia Favela,E. Vedanshi Massaro

## 2022-06-24 ENCOUNTER — Encounter (HOSPITAL_COMMUNITY): Payer: Self-pay | Admitting: Gastroenterology

## 2022-06-24 ENCOUNTER — Ambulatory Visit: Payer: Self-pay | Admitting: General Surgery

## 2022-06-24 LAB — SURGICAL PATHOLOGY

## 2022-06-24 NOTE — H&P (Signed)
REFERRING PHYSICIAN:  Lincoln Brigham, [place]   PROVIDER:  Monico Blitz, MD   MRN: [id number] DOB: 1946/02/20 DATE OF ENCOUNTER: 06/18/2022   Subjective    Chief Complaint: New Consultation ( eval for possible appendiceal cancer )       History of Present Illness: [name] [name] is a 76 y.o. female who is seen today as an office consultation at the request of Dr. Charlies Silvers for evaluation of New Consultation ( eval for possible appendiceal cancer ) .  Patient underwent a recent CT scan for evaluation of renal mass seen on MRI.  She was noted to have a left kidney lesion concerning for malignancy as well as a dilated appendix concerning for possible malignancy or mucocele.  Her last colonoscopy was approximately 3 years ago and she is due for a repeat colonoscopy in January.  She is currently getting worked up for her renal malignancy and surgery is being planned.       Review of Systems: A complete review of systems was obtained from the patient.  I have reviewed this information and discussed as appropriate with the patient.  See HPI as well for other ROS.       Medical History: Past Medical History      Past Medical History:  Diagnosis Date   Anxiety     Arrhythmia     Arthritis     Asthma, unspecified asthma severity, unspecified whether complicated, unspecified whether persistent     CHF (congestive heart failure) (CMS-HCC)     Diabetes mellitus without complication (CMS-HCC)     End-stage liver disease (CMS-HCC)     Esophageal varices (CMS-HCC)     GERD (gastroesophageal reflux disease)     History of cancer     Hyperlipidemia     Sleep apnea          There is no problem list on file for this patient.     Past Surgical History       Past Surgical History:  Procedure Laterality Date   CHOLECYSTECTOMY       HYSTERECTOMY       JOINT REPLACEMENT       left kidney stone       SHOULDER ARTHROSCOPY DISTAL CLAVICLE EXCISION AND OPEN ROTATOR CUFF REPAIR             Allergies       Allergies  Allergen Reactions   Doxycycline Hives   Epinephrine Hcl Other (See Comments)      REACTION: Had tremors when it was given.   Metformin Other (See Comments)   Oxycodone Other (See Comments)      "felt like I was dying"              Current Outpatient Medications on File Prior to Visit  Medication Sig Dispense Refill   atenoloL (TENORMIN) 25 MG tablet TAKE 1/2 TO 1 TABLET BY MOUTH 2 (TWO) TIMES DAILY.       azaTHIOprine (IMURAN) 50 mg tablet TAKE 2 TABLETS BY MOUTH EVERY DAY FOR 90 DAYS       HUMALOG U-100 INSULIN injection (concentration 100 units/mL) INJECT 25 UNITS SUBCUTANEOUSLY THREE TIMES DAILY       insulin NPH (HUMULIN N) injection (concentration 100 units/mL) 40u       pantoprazole (PROTONIX) 40 MG DR tablet 1 TAB(S) ORALLY 20 MINUTES BEFORE BREAKFAST 90 DAYS       cetirizine (ZYRTEC) 10 MG tablet Take 10 mg by mouth at bedtime  fluticasone propionate (FLONASE) 50 mcg/actuation nasal spray Place into one nostril       FUROsemide (LASIX) 40 MG tablet Take 40 mg by mouth 2 (two) times daily       magnesium oxide-Mg AA chelate (MAGNESIUM, OXIDE/AA CHELATE,) 300 mg Cap 1 capsule with a meal        No current facility-administered medications on file prior to visit.      Family History       Family History  Problem Relation Age of Onset   Skin cancer Mother     Obesity Mother     Deep vein thrombosis (DVT or abnormal blood clot formation) Mother     Skin cancer Father     Obesity Father     Hyperlipidemia (Elevated cholesterol) Father     Coronary Artery Disease (Blocked arteries around heart) Father     Skin cancer Sister     Obesity Sister     Hyperlipidemia (Elevated cholesterol) Sister     Skin cancer Brother     Obesity Brother     High blood pressure (Hypertension) Brother     Hyperlipidemia (Elevated cholesterol) Brother     Coronary Artery Disease (Blocked arteries around heart) Brother          Social History        Tobacco Use  Smoking Status Never  Smokeless Tobacco Never      Social History  Social History        Socioeconomic History   Marital status: Unknown  Tobacco Use   Smoking status: Never   Smokeless tobacco: Never  Substance and Sexual Activity   Alcohol use: Not Currently   Drug use: Never        Objective:         Vitals:    06/18/22 1010  BP: (!) 140/80  Pulse: 62  Temp: 36.7 C (98 F)  SpO2: 98%  Weight: 94.8 kg (209 lb)  Height: 160 cm (5' 3" )      Exam Gen: NAD Abd: soft       Labs, Imaging and Diagnostic Testing: CT reviewed.  Most recent colonoscopy report reviewed as well.  Repeat colonoscopy on 12/2 negative at appendiceal orifice per Dr Benson Norway   Assessment and Plan:  Diagnoses and all orders for this visit:   Mass of appendix     Patient with a mid appendiceal mass noted on most recent CT scan.  She is due for a updated colonoscopy.  I have talked to Dr. Benson Norway and he will perform this later this week.  If the colonoscopy does not show a clear appendiceal orifice or colonic mass, I think it would be reasonable to proceed with appendectomy during the patient's nephrectomy surgery.  If there are signs of malignancy, I think a right colectomy would be most appropriate.   No follow-ups on file.   Rosario Adie, MD Colon and Rectal Surgery [facility] Surgery

## 2022-06-24 NOTE — Progress Notes (Signed)
Attempted, unable to leave message/bt

## 2022-06-25 ENCOUNTER — Other Ambulatory Visit: Payer: Self-pay | Admitting: Urology

## 2022-06-26 LAB — HM DIABETES EYE EXAM

## 2022-06-27 ENCOUNTER — Encounter: Payer: Self-pay | Admitting: Family Medicine

## 2022-07-04 ENCOUNTER — Telehealth: Payer: Self-pay | Admitting: Hematology and Oncology

## 2022-07-04 ENCOUNTER — Encounter: Payer: Self-pay | Admitting: Physician Assistant

## 2022-07-04 ENCOUNTER — Telehealth: Payer: Self-pay | Admitting: Physician Assistant

## 2022-07-04 ENCOUNTER — Ambulatory Visit: Payer: PPO | Attending: Physician Assistant | Admitting: Physician Assistant

## 2022-07-04 VITALS — BP 120/72 | HR 60 | Ht 63.0 in | Wt 211.0 lb

## 2022-07-04 DIAGNOSIS — Z01818 Encounter for other preprocedural examination: Secondary | ICD-10-CM

## 2022-07-04 DIAGNOSIS — N2889 Other specified disorders of kidney and ureter: Secondary | ICD-10-CM | POA: Diagnosis not present

## 2022-07-04 DIAGNOSIS — Z794 Long term (current) use of insulin: Secondary | ICD-10-CM

## 2022-07-04 DIAGNOSIS — R918 Other nonspecific abnormal finding of lung field: Secondary | ICD-10-CM

## 2022-07-04 DIAGNOSIS — E118 Type 2 diabetes mellitus with unspecified complications: Secondary | ICD-10-CM

## 2022-07-04 DIAGNOSIS — R0609 Other forms of dyspnea: Secondary | ICD-10-CM

## 2022-07-04 DIAGNOSIS — R002 Palpitations: Secondary | ICD-10-CM | POA: Diagnosis not present

## 2022-07-04 NOTE — Progress Notes (Signed)
Cardiology Office Note:    Date:  07/06/2022   ID:  [name] [name], DOB 1945/08/11, MRN [id number]  PCP:  Jinny Sanders, MD   [facility] Cardiologist:  Quay Burow, MD     Referring MD: No ref. provider found   Chief Complaint  Patient presents with   Follow-up   Headache   Shortness of Breath   Edema    History of Present Illness:    [name] [name] is a 76 y.o. female with a hx of obesity, chronic diastolic dysfunction, palpitation, hyperlipidemia, DM2, obstructive sleep apnea not on CPAP.  Previous event monitor obtained in June 2013 showed PACs and PVCs.  She also has chronic transaminitis elevation.  Patient was admitted to the hospital in June 2020 with atypical chest pain.  Serial troponin was negative.  Myoview was low risk without evidence of infarction or ischemia. Patient was last seen in the ED in October 2021 due to generalized weakness.  She was noted to be bradycardic in the 40s.  She was also found to have a urinary tract infection and was given Keflex.  Patient was instructed to stop her atenolol.  On follow-up, patient did not remember the ED visit and was still taking atenolol.    Patient was recently diagnosed with renal cancer and is pending left nephrectomy on 08/14/2022.  She also has inflamed appendix and likely will require appendectomy at the same time of nephrectomy.  Her CT image obtained on 06/17/2022 showed scattered left-sided nodules measuring up to 10 mm suspicious for metastatic disease, he has a heterogeneous left upper lobe renal neoplasm measuring 6.3 cm, cirrhotic hepatic morphology, aortic atherosclerosis.  Talking with the patient today, she denies any recent chest pain.  She has been feeling some shortness of breath for the past 6 months.  On physical exam, she appears to be euvolemic.  She also did complain of slightly increased PVCs palpitation.  She is here for preop clearance prior to her upcoming left nephrectomy and  appendectomy surgery.  I discussed her case with DOD Dr. Shelva Majestic, given lack of chest pain but has the presence of shortness of breath, we will obtain an echocardiogram, if echocardiogram is abnormal, then we will consider a stress test.  However if the echocardiogram is normal, she is at acceptable risk to proceed with surgery from the cardiac perspective.  She requested a report on her recent CT image obtained on 11/27 since she never got the report, I requested her to discuss the finding with oncology service as I think it would be better for oncology/hematology service to discuss with her possibility of metastatic cancer based on the recent reading.     Past Medical History:  Diagnosis Date   Allergic rhinitis, cause unspecified    Arthritis    hands, feet and back   Benign neoplasm of colon    Complication of anesthesia    " i WAKE UP WITH TREMORS "   Cyst and pseudocyst of pancreas    Depressive disorder, not elsewhere classified    Diverticulitis    Diverticulosis of colon (without mention of hemorrhage)    Esophageal reflux    Essential and other specified forms of tremor    Irritable bowel syndrome    Obesity, unspecified    Other specified cardiac dysrhythmias(427.89)    Periapical abscess without sinus    Polyneuropathy in diabetes(357.2)    Pure hypercholesterolemia    Sleep apnea    CPAP- non compliant  Type II or unspecified type diabetes mellitus with neurological manifestations, not stated as uncontrolled(250.60)     Past Surgical History:  Procedure Laterality Date   ABDOMINAL HYSTERECTOMY     CALCANEAL OSTEOTOMY Right 07/27/2015   Procedure: RIGHT CALCANEAL OSTEOTOMY ;  Surgeon: Wylene Simmer, MD;  Location: [place];  Service: Orthopedics;  Laterality: Right;   CHOLECYSTECTOMY     15-20 years ago   COLONOSCOPY WITH PROPOFOL N/A 08/17/2019   Procedure: COLONOSCOPY WITH PROPOFOL;  Surgeon: Carol Ada, MD;  Location: [place];  Service:  Endoscopy;  Laterality: N/A;   COLONOSCOPY WITH PROPOFOL N/A 06/21/2022   Procedure: COLONOSCOPY WITH PROPOFOL;  Surgeon: Carol Ada, MD;  Location: [facility];  Service: Gastroenterology;  Laterality: N/A;   ESOPHAGOGASTRODUODENOSCOPY (EGD) WITH PROPOFOL N/A 08/17/2019   Procedure: ESOPHAGOGASTRODUODENOSCOPY (EGD) WITH PROPOFOL;  Surgeon: Carol Ada, MD;  Location: [place];  Service: Endoscopy;  Laterality: N/A;   GASTROCNEMIUS RECESSION Right 07/27/2015   Procedure: RIGHT GASTROC RECESSION;  Surgeon: Wylene Simmer, MD;  Location: [place];  Service: Orthopedics;  Laterality: Right;   KIDNEY STONE SURGERY  1980's   removal   LITHOTRIPSY     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   PARTIAL HYSTERECTOMY  1990's   POLYPECTOMY  08/17/2019   Procedure: POLYPECTOMY;  Surgeon: Carol Ada, MD;  Location: [facility];  Service: Endoscopy;;   POLYPECTOMY  06/21/2022   Procedure: POLYPECTOMY;  Surgeon: Carol Ada, MD;  Location: [facility];  Service: Gastroenterology;;   Cherre Robins CUFF REPAIR  2005    Current Medications: Current Meds  Medication Sig   albuterol (PROAIR HFA) 108 (90 Base) MCG/ACT inhaler Inhale 1-2 puffs into the lungs every 6 (six) hours as needed for wheezing or shortness of breath.   atenolol (TENORMIN) 25 MG tablet TAKE 1/2 TO 1 TABLET BY MOUTH 2 (TWO) TIMES DAILY. (Patient taking differently: Take 25 mg by mouth 2 (two) times daily.)   azaTHIOprine (IMURAN) 50 MG tablet Take 50 mg by mouth 2 (two) times daily.   cetirizine (ZYRTEC) 10 MG tablet Take 10 mg by mouth at bedtime.   Coenzyme Q10 100 MG TABS Take 100 mg by mouth daily.   Continuous Blood Gluc Receiver (FREESTYLE LIBRE 2 READER) DEVI See admin instructions.   Continuous Blood Gluc Sensor (FREESTYLE LIBRE 2 SENSOR) MISC See admin instructions.   furosemide (LASIX) 40 MG tablet Take 40 mg by mouth daily.   glucose blood test strip OneTouch Ultra Blue Test Strip  TEST 3 TIMES DAILY E11.65   insulin lispro  (HUMALOG) 100 UNIT/ML injection Inject 28 Units into the skin 3 (three) times daily before meals.   insulin NPH Human (NOVOLIN N) 100 UNIT/ML injection Inject 18 Units into the skin daily before breakfast.   Lactobacillus Rhamnosus, GG, (CULTURELLE PO) Take 1 capsule by mouth daily.   MAGNESIUM PO Take 150 mg by mouth 2 (two) times daily.   mometasone (NASONEX) 50 MCG/ACT nasal spray Place 2 sprays into the nose daily.   pantoprazole (PROTONIX) 40 MG tablet TAKE 1 TABLET BY MOUTH EVERY DAY   predniSONE (DELTASONE) 10 MG tablet Take 10 mg by mouth 2 (two) times daily with a meal.   predniSONE (DELTASONE) 50 MG tablet Take 1 tablet 13 hours, 7 hours and 1 hour prior to CT scan injection   Turmeric (QC TUMERIC COMPLEX PO) Take 500 mg by mouth 2 (two) times daily.   UNABLE TO FIND Take 1 tablet by mouth daily. Med Name: Heide Scales Vitamin  zinc gluconate 50 MG tablet Take 50 mg by mouth at bedtime.     Allergies:   Doxycycline, Epinephrine hcl, Erythromycin ethylsuccinate, Iohexol, Metformin, Oxycodone, and Tape   Social History   Socioeconomic History   Marital status: Widowed    Spouse name: Not on file   Number of children: 5   Years of education: Not on file   Highest education level: Not on file  Occupational History    Employer: RETIRED  Tobacco Use   Smoking status: Never   Smokeless tobacco: Never  Vaping Use   Vaping Use: Never used  Substance and Sexual Activity   Alcohol use: No    Alcohol/week: 0.0 standard drinks of alcohol   Drug use: No   Sexual activity: Not on file  Other Topics Concern   Not on file  Social History Narrative   Husband treated for testicular cancer, 2 cups coffee a day, no exercise   Social Determinants of Health   Financial Resource Strain: Low Risk  (01/14/2022)   Overall Financial Resource Strain (CARDIA)    Difficulty of Paying Living Expenses: Not very hard  Food Insecurity: No Food Insecurity (01/14/2022)   Hunger Vital Sign    Worried  About Running Out of Food in the Last Year: Never true    Ran Out of Food in the Last Year: Never true  Transportation Needs: No Transportation Needs (01/14/2022)   PRAPARE - Hydrologist (Medical): No    Lack of Transportation (Non-Medical): No  Physical Activity: Inactive (01/14/2022)   Exercise Vital Sign    Days of Exercise per Week: 0 days    Minutes of Exercise per Session: 0 min  Stress: No Stress Concern Present (01/14/2022)   [place]    Feeling of Stress : Only a little  Social Connections: Not on file     Family History: The patient's family history includes Allergies in an other family member; Asthma in an other family member; Cancer in an other family member; Cirrhosis in her father; Clotting disorder in her mother; Diabetes in an other family member.  ROS:   Please see the history of present illness.     All other systems reviewed and are negative.  EKGs/Labs/Other Studies Reviewed:    The following studies were reviewed today:  Myoview 12/22/2018 IMPRESSION: 1. No reversible ischemia or infarction.   2. Normal left ventricular wall motion.   3. Left ventricular ejection fraction 82%   4. Non invasive risk stratification*: Low  EKG:  EKG is ordered today.  The ekg ordered today demonstrates normal sinus rhythm, no significant ST-T wave changes.  Recent Labs: 06/06/2022: ALT 25; BUN 22; Creatinine 0.81; Hemoglobin 12.3; Platelet Count 178; Potassium 4.5; Sodium 141  Recent Lipid Panel    Component Value Date/Time   CHOL 162 01/07/2017 0955   TRIG 203.0 (H) 01/07/2017 0955   HDL 38.20 (L) 01/07/2017 0955   CHOLHDL 4 01/07/2017 0955   VLDL 40.6 (H) 01/07/2017 0955   LDLCALC 123 (H) 07/03/2012 1243   LDLDIRECT 91.0 01/07/2017 0955     Risk Assessment/Calculations:           Physical Exam:    VS:  BP 120/72 (BP Location: Right Wrist, Patient Position:  Sitting, Cuff Size: Large)   Pulse 60   Ht 5' 3"  (1.6 m)   Wt 211 lb (95.7 kg)   BMI 37.38 kg/m  Wt Readings from Last 3 Encounters:  07/04/22 211 lb (95.7 kg)  06/21/22 205 lb (93 kg)  06/05/22 212 lb 9.6 oz (96.4 kg)     GEN:  Well nourished, well developed in no acute distress HEENT: Normal NECK: No JVD; No carotid bruits LYMPHATICS: No lymphadenopathy CARDIAC: RRR, no murmurs, rubs, gallops RESPIRATORY:  Clear to auscultation without rales, wheezing or rhonchi  ABDOMEN: Soft, non-tender, non-distended MUSCULOSKELETAL:  No edema; No deformity  SKIN: Warm and dry NEUROLOGIC:  Alert and oriented x 3 PSYCHIATRIC:  Normal affect   ASSESSMENT:    1. Preop examination   2. Palpitations   3. DOE (dyspnea on exertion)   4. Renal mass, left   5. Hyperlipidemia LDL goal <100   6. Controlled type 2 diabetes mellitus without complication, without long-term current use of insulin (HCC)    PLAN:    In order of problems listed above:  Preoperative clearance: Patient has upcoming left nephrectomy and appendectomy on 08/14/2022.  She denies any recent chest pain, she does feel more short of breath in the past 4-month I discussed her case with DOD Dr. TShelva Majesticwho recommended baseline echocardiogram.  If her echocardiogram is normal, then no further workup is needed prior to surgery.  If her echocardiogram is abnormal, then we will consider additional study  Palpitation: She has a history of PVCs, her palpitation is likely related to PVCs.  EKG today showed normal sinus rhythm.  Continue atenolol  Dyspnea on exertion: See #1, I will obtain echocardiogram  Left renal mass: Recent CT of the chest also showed possible metastasis to the lung.  Followed by hematology oncology service.  She requested the report on the recent chest CT as she has not gotten the final report from oncology service yet.   DM2: On insulin            Medication Adjustments/Labs and Tests  Ordered: Current medicines are reviewed at length with the patient today.  Concerns regarding medicines are outlined above.  Orders Placed This Encounter  Procedures   EKG 12-Lead   ECHOCARDIOGRAM COMPLETE   No orders of the defined types were placed in this encounter.   Patient Instructions  Medication Instructions:   Your physician recommends that you continue on your current medications as directed. Please refer to the Current Medication list given to you today.  *If you need a refill on your cardiac medications before your next appointment, please call your pharmacy*  Lab Work: NONE ordered at this time of appointment   If you have labs (blood work) drawn today and your tests are completely normal, you will receive your results only by: [place](if you have MyChart) OR A paper copy in the mail If you have any lab test that is abnormal or we need to change your treatment, we will call you to review the results.  Testing/Procedures: Your physician has requested that you have an echocardiogram. Echocardiography is a painless test that uses sound waves to create images of your heart. It provides your doctor with information about the size and shape of your heart and how well your heart's chambers and valves are working. This procedure takes approximately one hour. There are no restrictions for this procedure. Please do NOT wear cologne, perfume, aftershave, or lotions (deodorant is allowed). Please arrive 15 minutes prior to your appointment time.   Follow-Up: [facility] and your [facility] needs are our [facility].  As part of our continuing mission to  provide you with exceptional heart care, we have created designated Provider Care Teams.  These Care Teams include your primary Cardiologist (physician) and Advanced Practice Providers (APPs -  Physician Assistants and Nurse Practitioners) who all work together to provide you with the care you need, when you need  it.  We recommend signing up for the patient portal called "MyChart".  Sign up information is provided on this After Visit Summary.  MyChart is used to connect with patients for Virtual Visits (Telemedicine).  Patients are able to view lab/test results, encounter notes, upcoming appointments, etc.  Non-urgent messages can be sent to your provider as well.   To learn more about what you can do with MyChart, go to [email or web address].    Your next appointment:   1 year(s)  The format for your next appointment:   In Person  Provider:   Quay Burow, MD     Other Instructions  Important Information About Sugar                         Signed, Almyra Deforest, [place]  07/06/2022 10:30 PM    [place]

## 2022-07-04 NOTE — Telephone Encounter (Signed)
Called patient and spoke with her daughter. Patient will be notified and informed about the planned CT.

## 2022-07-04 NOTE — Telephone Encounter (Signed)
I reviewed the CT chest results with patient's daughter. Findings show new scattered left sided pulmonary nodules that are small and measure up to 10 mm. We will obtain repeat CT chest in approximately 4 weeks to monitor before her upcoming nephrectomy and appendectomy on 08/14/2022.

## 2022-07-04 NOTE — Patient Instructions (Addendum)
Medication Instructions:   Your physician recommends that you continue on your current medications as directed. Please refer to the Current Medication list given to you today.  *If you need a refill on your cardiac medications before your next appointment, please call your pharmacy*  Lab Work: NONE ordered at this time of appointment   If you have labs (blood work) drawn today and your tests are completely normal, you will receive your results only by: [place] (if you have MyChart) OR A paper copy in the mail If you have any lab test that is abnormal or we need to change your treatment, we will call you to review the results.  Testing/Procedures: Your physician has requested that you have an echocardiogram. Echocardiography is a painless test that uses sound waves to create images of your heart. It provides your doctor with information about the size and shape of your heart and how well your heart's chambers and valves are working. This procedure takes approximately one hour. There are no restrictions for this procedure. Please do NOT wear cologne, perfume, aftershave, or lotions (deodorant is allowed). Please arrive 15 minutes prior to your appointment time.   Follow-Up: At [facility], you and your health needs are our priority.  As part of our continuing mission to provide you with exceptional heart care, we have created designated Provider Care Teams.  These Care Teams include your primary Cardiologist (physician) and Advanced Practice Providers (APPs -  Physician Assistants and Nurse Practitioners) who all work together to provide you with the care you need, when you need it.  We recommend signing up for the patient portal called "MyChart".  Sign up information is provided on this After Visit Summary.  MyChart is used to connect with patients for Virtual Visits (Telemedicine).  Patients are able to view lab/test results, encounter notes, upcoming appointments, etc.   Non-urgent messages can be sent to your provider as well.   To learn more about what you can do with MyChart, go to [email or web address].    Your next appointment:   1 year(s)  The format for your next appointment:   In Person  Provider:   Quay Burow, MD     Other Instructions  Important Information About Sugar

## 2022-07-16 ENCOUNTER — Other Ambulatory Visit: Payer: Self-pay | Admitting: Cardiovascular Disease

## 2022-07-16 ENCOUNTER — Ambulatory Visit (HOSPITAL_COMMUNITY): Payer: PPO | Attending: Cardiology

## 2022-07-16 DIAGNOSIS — R0609 Other forms of dyspnea: Secondary | ICD-10-CM | POA: Diagnosis present

## 2022-07-16 LAB — ECHOCARDIOGRAM COMPLETE
Area-P 1/2: 2.59 cm2
S' Lateral: 2.9 cm

## 2022-07-19 NOTE — Telephone Encounter (Signed)
   Patient Name: [name] [name]  DOB: 1945/08/07 MRN: [id number]  Primary Cardiologist: Quay Burow, MD  Chart reviewed as part of pre-operative protocol coverage. Given past medical history and time since last visit, based on ACC/AHA guidelines, Tyrisha Benninger is at acceptable risk for the planned procedure without further cardiovascular testing.   Recent echocardiogram obtained on 07/16/2022 was reassuring.  Patient is cleared from the cardiac perspective to proceed with surgery.  The patient was advised that if she develops new symptoms prior to surgery to contact our office to arrange for a follow-up visit, and she verbalized understanding.  I will route this recommendation to the requesting party via Epic fax function and remove from pre-op pool.  Please call with questions.  [place], [place] 07/19/2022, 4:38 PM

## 2022-07-29 DIAGNOSIS — E1165 Type 2 diabetes mellitus with hyperglycemia: Secondary | ICD-10-CM | POA: Diagnosis not present

## 2022-08-02 DIAGNOSIS — D49512 Neoplasm of unspecified behavior of left kidney: Secondary | ICD-10-CM | POA: Diagnosis not present

## 2022-08-02 DIAGNOSIS — N390 Urinary tract infection, site not specified: Secondary | ICD-10-CM | POA: Diagnosis not present

## 2022-08-02 NOTE — Progress Notes (Addendum)
Anesthesia Review:  PCP: Eliezer Lofts  Cardiologist   Hao meng,PA LOV for preop exam on 07/09/22  Endocrinologist- Dr Chalmers Cater  DR Carol Ada- liver MD  Chest x-ray  CT Chest- 06/18/22  CT chest- 08/05/22 EKG : 07/04/22  Echo : 07/16/22  Stress test: 2020  Cardiac Cath :  Activity level: cannot do a flight of stairs without difficulty  Sleep Study/ CPAP : has cpap to bring mask and tubing day of surgery  Fasting Blood Sugar :      / Checks Blood Sugar -- times a day:   Blood Thinner/ Instructions /Last Dose: ASA / Instructions/ Last Dose :    DM-  2-  Insulin Pump  Freestyle Libre  Hgba1c-08/05/22- 7.9 - roiuted ot Dr Lovena Neighbours and Marcello Moores on 08/05/22.   06/21/22- Colonoscopy   PT came  to preop with daughter on 08/05/22.  PT in wheelchair.  PT reports she had CT this am and does not have Insulin pump on nor Freestyle Libre on.  PT states she has oly had Insulin Pump approx one month.  PT states she did have yogurt and guacomolddipe and crackers after CT prior to preop appt.  PT states she is waiting on a new sensor but she does have old one .  Hoping to be at home today when she arrives back home.  PT aware glucose at preop was 317.  PT and daughter aware she needs to go back on Freestyle LIbre monitor and Insulin Pump.   Diabetic Coordinator, Lelan Pons was called at time of preop appt.  She is aware of surgery date, time and length of surgery.  She is also aware that pt is contacting DR Balan ( endo) for preop instructions .  PT to contact PST nurse with recommendations from DR [facility].   Myra Gianotti , PA with Anesthesia, [employer] was made aware glucose was 317 at preop appt but Insulin Pump was off and Freestyle Elenor Legato was off of pt today for CT scan per pt.  Ebony Hail aware pt is with daughter and pt aware of glucose and needs to place Insulin Pump and sensor on when returns home.   PT has copy of Insulin Pump contract with her.   Insulin Pump Policy on chart.  PT is Allergic to Epinephrine.    BMP results of 08/05/22- faxed to Dr Marcello Moores and to DR Winter.    Called pt back at 1830pm to check on her and daughter answered phone.  Daughter reports pt did give herself an Insulin shot when she got home from preop appt.    Pt has never called preop nurse back  in regards to Insulin Pump instructions from DR Balan.  Called and spoke with pt on 08/12/22.  PT states she is currently not using insulin Pump due to a ? Part that has not arrived and she has been doing the following:  Humulin N 15 units every am Humalog 25 units at breakfast, lunch and supper.   PT instructed on phone call of 08/12/22 to do the following:  PT is to take usual dose of Humulin N day before surgery and 1/2 dose of Humulin N day of surgery ( 7 units).   PT is to take usual doses  of Humalog the day before surgery  and none day of surgery.  PT also instructed to notify Dr Chalmers Cater today and let her know this is an urgent message and to inform DR Chalmers Cater she is on a clear liquid diet the  day before surgery and to inform DR Chalmers Cater of what instructions she has been given  in regards to Insulin.  PT aware to follow whatever instructions Dr Chalmers Cater gives her.  PT aware to call DR Chalmers Cater as soon as she hangs up from this phone call on 08/12/22 .  PT repeated instructgions back to preop nurse.    PT called back on 08/12/22 and spoke with preop nurse and stated she had been in touch with DR Chalmers Cater, endocrinologist, and DR Chalmers Cater informed pt to follow preop instructions as had been given on 08/12/22.

## 2022-08-02 NOTE — Patient Instructions (Signed)
SURGICAL WAITING ROOM VISITATION  Patients having surgery or a procedure may have no more than 2 support people in the waiting area - these visitors may rotate.    Children under the age of 66 must have an adult with them who is not the patient.  Due to an increase in RSV and influenza rates and associated hospitalizations, children ages 48 and under may not visit patients in [place].  If the patient needs to stay at the hospital during part of their recovery, the visitor guidelines for inpatient rooms apply. Pre-op nurse will coordinate an appropriate time for 1 support person to accompany patient in pre-op.  This support person may not rotate.    Please refer to the [facility] website for the visitor guidelines for Inpatients (after your surgery is over and you are in a regular room).       Your procedure is scheduled on:  08/14/22    Report to [facility] Main Entrance    Report to admitting at   1000 AM   Call this number if you have problems the morning of surgery [phone]     Clear liquid diet the day before surgery . Marland Kitchen                If you have questions, please contact your surgeon's office.  !     Oral Hygiene is also important to reduce your risk of infection.                                    Remember - BRUSH YOUR TEETH THE MORNING OF SURGERY WITH YOUR REGULAR TOOTHPASTE  DENTURES WILL BE REMOVED PRIOR TO SURGERY PLEASE DO NOT APPLY "Poly grip" OR ADHESIVES!!!   Do NOT smoke after Midnight   Take these medicines the morning of surgery with A SIP OF WATER:  inhalers as usual and bring, protonix, atenololk immuran , nasal spray   DO NOT TAKE ANY ORAL DIABETIC MEDICATIONS DAY OF YOUR SURGERY  Bring CPAP mask and tubing day of surgery.                              You may not have any metal on your body including hair pins, jewelry, and body piercing             Do not wear make-up, lotions, powders, perfumes/cologne, or  deodorant  Do not wear nail polish including gel and S&S, artificial/acrylic nails, or any other type of covering on natural nails including finger and toenails. If you have artificial nails, gel coating, etc. that needs to be removed by a nail salon please have this removed prior to surgery or surgery may need to be canceled/ delayed if the surgeon/ anesthesia feels like they are unable to be safely monitored.   Do not shave  48 hours prior to surgery.               Men may shave face and neck.   Do not bring valuables to the hospital. [place].   Contacts, glasses, dentures or bridgework may not be worn into surgery.   Bring small overnight bag day of surgery.   DO NOT [place]. PHARMACY WILL  DISPENSE MEDICATIONS LISTED ON YOUR MEDICATION LIST TO YOU DURING YOUR ADMISSION [place]!    Patients discharged on the day of surgery will not be allowed to drive home.  Someone NEEDS to stay with you for the first 24 hours after anesthesia.   Special Instructions: Bring a copy of your healthcare power of attorney and living will documents the day of surgery if you haven't scanned them before.              Please read over the following fact sheets you were given: IF [place] [phone]   If you received a COVID test during your pre-op visit  it is requested that you wear a mask when out in public, stay away from anyone that may not be feeling well and notify your surgeon if you develop symptoms. If you test positive for Covid or have been in contact with anyone that has tested positive in the last 10 days please notify you surgeon.    [place] - Preparing for Surgery Before surgery, you can play an important role.  Because skin is not sterile, your skin needs to be as free of germs as possible.  You can reduce the number of germs on your skin by  washing with CHG (chlorahexidine gluconate) soap before surgery.  CHG is an antiseptic cleaner which kills germs and bonds with the skin to continue killing germs even after washing. Please DO NOT use if you have an allergy to CHG or antibacterial soaps.  If your skin becomes reddened/irritated stop using the CHG and inform your nurse when you arrive at Short Stay. Do not shave (including legs and underarms) for at least 48 hours prior to the first CHG shower.  You may shave your face/neck. Please follow these instructions carefully:  1.  Shower with CHG Soap the night before surgery and the  morning of Surgery.  2.  If you choose to wash your hair, wash your hair first as usual with your  normal  shampoo.  3.  After you shampoo, rinse your hair and body thoroughly to remove the  shampoo.                           4.  Use CHG as you would any other liquid soap.  You can apply chg directly  to the skin and wash                       Gently with a scrungie or clean washcloth.  5.  Apply the CHG Soap to your body ONLY FROM THE NECK DOWN.   Do not use on face/ open                           Wound or open sores. Avoid contact with eyes, ears mouth and genitals (private parts).                       Wash face,  Genitals (private parts) with your normal soap.             6.  Wash thoroughly, paying special attention to the area where your surgery  will be performed.  7.  Thoroughly rinse your body with warm water from the neck down.  8.  DO NOT shower/wash with your normal soap after using and  rinsing off  the CHG Soap.                9.  Pat yourself dry with a clean towel.            10.  Wear clean pajamas.            11.  Place clean sheets on your bed the night of your first shower and do not  sleep with pets. Day of Surgery : Do not apply any lotions/deodorants the morning of surgery.  Please wear clean clothes to the hospital/surgery center.  FAILURE TO FOLLOW THESE INSTRUCTIONS MAY RESULT IN THE  CANCELLATION OF YOUR SURGERY PATIENT SIGNATURE_________________________________  NURSE SIGNATURE__________________________________  ________________________________________________________________________

## 2022-08-05 ENCOUNTER — Encounter (HOSPITAL_COMMUNITY)
Admission: RE | Admit: 2022-08-05 | Discharge: 2022-08-05 | Disposition: A | Discharge status: Home / Self Care | Payer: PPO | Source: Ambulatory Visit | Attending: Physician Assistant | Admitting: Physician Assistant

## 2022-08-05 ENCOUNTER — Encounter (HOSPITAL_COMMUNITY): Payer: Self-pay

## 2022-08-05 ENCOUNTER — Ambulatory Visit (HOSPITAL_COMMUNITY)
Admission: RE | Admit: 2022-08-05 | Discharge: 2022-08-05 | Disposition: A | Discharge status: Home / Self Care | Payer: PPO | Source: Ambulatory Visit | Attending: Physician Assistant | Admitting: Physician Assistant

## 2022-08-05 ENCOUNTER — Other Ambulatory Visit: Payer: Self-pay

## 2022-08-05 VITALS — BP 141/53 | HR 60 | Temp 98.2°F | Resp 16

## 2022-08-05 DIAGNOSIS — R918 Other nonspecific abnormal finding of lung field: Secondary | ICD-10-CM

## 2022-08-05 DIAGNOSIS — Z01818 Encounter for other preprocedural examination: Secondary | ICD-10-CM | POA: Insufficient documentation

## 2022-08-05 DIAGNOSIS — Z9641 Presence of insulin pump (external) (internal): Secondary | ICD-10-CM | POA: Insufficient documentation

## 2022-08-05 DIAGNOSIS — E1142 Type 2 diabetes mellitus with diabetic polyneuropathy: Secondary | ICD-10-CM | POA: Diagnosis not present

## 2022-08-05 DIAGNOSIS — Z794 Long term (current) use of insulin: Secondary | ICD-10-CM | POA: Diagnosis not present

## 2022-08-05 HISTORY — DX: Other specified postprocedural states: Z98.890

## 2022-08-05 HISTORY — DX: Bursopathy, unspecified: M71.9

## 2022-08-05 HISTORY — DX: Personal history of other diseases of the digestive system: Z87.19

## 2022-08-05 HISTORY — DX: Personal history of urinary calculi: Z87.442

## 2022-08-05 HISTORY — DX: Other specified postprocedural states: R11.2

## 2022-08-05 HISTORY — DX: Heart failure, unspecified: I50.9

## 2022-08-05 HISTORY — DX: Anxiety disorder, unspecified: F41.9

## 2022-08-05 HISTORY — DX: Unspecified asthma, uncomplicated: J45.909

## 2022-08-05 HISTORY — DX: Type 2 diabetes mellitus without complications: E11.9

## 2022-08-05 HISTORY — DX: Unspecified cirrhosis of liver: K74.60

## 2022-08-05 HISTORY — DX: Cardiac arrhythmia, unspecified: I49.9

## 2022-08-05 HISTORY — DX: Dyspnea, unspecified: R06.00

## 2022-08-05 LAB — CBC
HCT: 39.5 % (ref 36.0–46.0)
Hemoglobin: 12.4 g/dL (ref 12.0–15.0)
MCH: 29.9 pg (ref 26.0–34.0)
MCHC: 31.4 g/dL (ref 30.0–36.0)
MCV: 95.2 fL (ref 80.0–100.0)
Platelets: 179 10*3/uL (ref 150–400)
RBC: 4.15 MIL/uL (ref 3.87–5.11)
RDW: 14.6 % (ref 11.5–15.5)
WBC: 6.3 10*3/uL (ref 4.0–10.5)
nRBC: 0 % (ref 0.0–0.2)

## 2022-08-05 LAB — HEMOGLOBIN A1C
Hgb A1c MFr Bld: 7.9 % — ABNORMAL HIGH (ref 4.8–5.6)
Mean Plasma Glucose: 180.03 mg/dL

## 2022-08-05 LAB — BASIC METABOLIC PANEL
Anion gap: 11 (ref 5–15)
BUN: 19 mg/dL (ref 8–23)
CO2: 25 mmol/L (ref 22–32)
Calcium: 9.5 mg/dL (ref 8.9–10.3)
Chloride: 102 mmol/L (ref 98–111)
Creatinine, Ser: 0.89 mg/dL (ref 0.44–1.00)
GFR, Estimated: >60 mL/min (ref >60)
Glucose, Bld: 368 mg/dL — ABNORMAL HIGH (ref 70–99)
Potassium: 4.3 mmol/L (ref 3.5–5.1)
Sodium: 138 mmol/L (ref 135–145)

## 2022-08-05 LAB — GLUCOSE, CAPILLARY: Glucose-Capillary: 317 mg/dL — ABNORMAL HIGH (ref 70–99)

## 2022-08-07 NOTE — Progress Notes (Signed)
Anesthesia Chart Review   Case: [id number] Date/Time: 08/14/22 1145   Procedures:      XI ROBOTIC LAPAROSCOPIC ASSISTED APPENDECTOMY     XI ROBOTIC ASSISTED LAPAROSCOPIC NEPHRECTOMY   Anesthesia type: General   Pre-op diagnosis: dilated appendix   Location: [facility] ROOM 03 / [facility]   Surgeons: Leighton Ruff, MD; Ceasar Mons, MD       DISCUSSION:77 y.o. never smoker with h/o PONV, DM II, chronic diastolic dysfunction, sleep apnea, dilated appendix scheduled for above procedure 08/14/22 with Dr. Ellison Hughs and Dr. Leighton Ruff.   Pt last seen by cardiology 07/04/2022. Per OV note, "Preoperative clearance: Patient has upcoming left nephrectomy and appendectomy on 08/14/2022.  She denies any recent chest pain, she does feel more short of breath in the past 80-month I discussed her case with DOD Dr. TShelva Majesticwho recommended baseline echocardiogram.  If her echocardiogram is normal, then no further workup is needed prior to surgery.  If her echocardiogram is abnormal, then we will consider additional study"  Echo 07/16/2022. Per results, "Normal pumping function of heart, mild stiffness of the heart wall, trivial amount of mitral valve leakage. Patient is at acceptable risk to proceed with her surgery given essentially normal echo. "  Anticipate pt can proceed with planned procedure barring acute status change.   VS: BP (!) 141/53   Pulse 60   Temp 36.8 C (Oral)   Resp 16   SpO2 98%   PROVIDERS: Bedsole, Amy E, MD is PCP   Cardiologist:  JQuay Burow MD    LABS:  glucose non-fasting, evaluate DOS (all labs ordered are listed, but only abnormal results are displayed)  Labs Reviewed  BASIC METABOLIC PANEL - Abnormal; Notable for the following components:      Result Value   Glucose, Bld 368 (*)    All other components within normal limits  HEMOGLOBIN A1C - Abnormal; Notable for the following components:   Hgb A1c MFr Bld 7.9 (*)    All other components within  normal limits  GLUCOSE, CAPILLARY - Abnormal; Notable for the following components:   Glucose-Capillary 317 (*)    All other components within normal limits  CBC  TYPE AND SCREEN     IMAGES:   EKG:   CV: Echo 07/16/2022 1. Left ventricular ejection fraction, by estimation, is 60 to 65%. The  left ventricle has normal function. The left ventricle has no regional  wall motion abnormalities. Left ventricular diastolic parameters are  consistent with Grade I diastolic  dysfunction (impaired relaxation).   2. Right ventricular systolic function is normal. The right ventricular  size is normal. Tricuspid regurgitation signal is inadequate for assessing  PA pressure.   3. The mitral valve is degenerative. Trivial mitral valve regurgitation.  Moderate mitral annular calcification.   4. The aortic valve was not well visualized. Aortic valve regurgitation  is not visualized. Aortic valve sclerosis/calcification is present,  without any evidence of aortic stenosis.   5. The inferior vena cava is normal in size with greater than 50%  respiratory variability, suggesting right atrial pressure of 3 mmHg.   Stress Test 12/22/2018 IMPRESSION: 1. No reversible ischemia or infarction.   2. Normal left ventricular wall motion.   3. Left ventricular ejection fraction 82%   4. 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   Complication of anesthesia    " i WAKE UP WITH TREMORS " after shoulder surgery tremors for 10 months after surgery surgery- 2009- surgical center in [place]   Cyst and pseudocyst of pancreas    Depressive disorder, not elsewhere classified    Diabetes mellitus without complication ([place])    Diverticulitis     Diverticulosis of colon (without mention of hemorrhage)    Dyspnea    Dysrhythmia    arrhythmia   Esophageal reflux    Essential and other specified forms of tremor    History of GI bleed    History of kidney stones    Irritable bowel syndrome    Obesity, unspecified    Other specified cardiac dysrhythmias(427.89)    Periapical abscess without sinus    Polyneuropathy in diabetes(357.2)    PONV (postoperative nausea and vomiting)    Pure hypercholesterolemia    Sleep apnea    CPAP- non compliant   Type II or unspecified type diabetes mellitus with neurological manifestations, not stated as uncontrolled(250.60)     Past Surgical History:  Procedure Laterality Date   ABDOMINAL HYSTERECTOMY     CALCANEAL OSTEOTOMY Right 07/27/2015   Procedure: RIGHT CALCANEAL OSTEOTOMY ;  Surgeon: Wylene Simmer, MD;  Location: [place];  Service: Orthopedics;  Laterality: Right;   CHOLECYSTECTOMY     15-20 years ago   COLONOSCOPY WITH PROPOFOL N/A 08/17/2019   Procedure: COLONOSCOPY WITH PROPOFOL;  Surgeon: Carol Ada, MD;  Location: [place];  Service: Endoscopy;  Laterality: N/A;   COLONOSCOPY WITH PROPOFOL N/A 06/21/2022   Procedure: COLONOSCOPY WITH PROPOFOL;  Surgeon: Carol Ada, MD;  [facility];  Service: Gastroenterology;  Laterality: N/A;   ESOPHAGOGASTRODUODENOSCOPY (EGD) WITH PROPOFOL N/A 08/17/2019   Procedure: ESOPHAGOGASTRODUODENOSCOPY (EGD) WITH PROPOFOL;  Surgeon: Carol Ada, MD;  Location: [place];  Service: Endoscopy;  Laterality: N/A;   GASTROCNEMIUS RECESSION Right 07/27/2015   Procedure: RIGHT GASTROC RECESSION;  Surgeon: Wylene Simmer, MD;  Location: [place];  Service: Orthopedics;  Laterality: Right;   KIDNEY STONE SURGERY  1980's   removal   LITHOTRIPSY     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   PARTIAL HYSTERECTOMY  1990's   POLYPECTOMY  08/17/2019   Procedure: POLYPECTOMY;  Surgeon: Carol Ada, MD;  Location: [facility] ENDOSCOPY;  Service:  Endoscopy;;   POLYPECTOMY  06/21/2022   Procedure: POLYPECTOMY;  Surgeon: Carol Ada, MD;  [facility];  Service: Gastroenterology;;   ROTATOR CUFF REPAIR  2005    MEDICATIONS:  albuterol (PROAIR HFA) 108 (90 Base) MCG/ACT inhaler   atenolol (TENORMIN) 25 MG tablet   azaTHIOprine (IMURAN) 50 MG tablet   cetirizine (ZYRTEC) 10 MG tablet   Coenzyme Q10 100 MG TABS   Continuous Blood Gluc Receiver (FREESTYLE LIBRE 2 READER) DEVI   Continuous Blood Gluc Sensor (FREESTYLE LIBRE 2 SENSOR) MISC   furosemide (LASIX) 40 MG tablet   glucose blood test strip   halobetasol (ULTRAVATE) 0.05 % cream   insulin lispro (HUMALOG) 100 UNIT/ML injection   Lactobacillus Rhamnosus, GG, (CULTURELLE PO)   MAGNESIUM PO   mometasone (NASONEX) 50 MCG/ACT nasal spray   pantoprazole (PROTONIX) 40 MG tablet   Polyethyl Glyc-Propyl Glyc PF (SYSTANE HYDRATION PF) 0.4-0.3 % SOLN   predniSONE (DELTASONE) 10 MG tablet   predniSONE (DELTASONE) 50 MG tablet  Turmeric (QC TUMERIC COMPLEX PO)   UNABLE TO FIND   zinc gluconate 50 MG tablet   No current facility-administered medications for this encounter.     Konrad Felix Ward, PA-C [facility] [phone]

## 2022-08-07 NOTE — Anesthesia Preprocedure Evaluation (Signed)
Anesthesia Evaluation  Patient identified by MRN, date of birth, ID band Patient awake    Reviewed: Allergy & Precautions, NPO status , Patient's Chart, lab work & pertinent test results  History of Anesthesia Complications (+) PONV and history of anesthetic complications  Airway Mallampati: III  TM Distance: >3 FB Neck ROM: Full    Dental  (+) Teeth Intact, Missing, Dental Advisory Given,    Pulmonary shortness of breath, asthma , sleep apnea , neg COPD, neg recent URI   breath sounds clear to auscultation       Cardiovascular hypertension, Pt. on medications and Pt. on home beta blockers +CHF   Rhythm:Regular  1. Left ventricular ejection fraction, by estimation, is 60 to 65%. The  left ventricle has normal function. The left ventricle has no regional  wall motion abnormalities. Left ventricular diastolic parameters are  consistent with Grade I diastolic  dysfunction (impaired relaxation).   2. Right ventricular systolic function is normal. The right ventricular  size is normal. Tricuspid regurgitation signal is inadequate for assessing  PA pressure.   3. The mitral valve is degenerative. Trivial mitral valve regurgitation.  Moderate mitral annular calcification.   4. The aortic valve was not well visualized. Aortic valve regurgitation  is not visualized. Aortic valve sclerosis/calcification is present,  without any evidence of aortic stenosis.   5. The inferior vena cava is normal in size with greater than 50%  respiratory variability, suggesting right atrial pressure of 3 mmHg.      Neuro/Psych  PSYCHIATRIC DISORDERS Anxiety Depression    negative neurological ROS     GI/Hepatic ,GERD  Medicated,,(+) Hepatitis -Lab Results      Component                Value               Date                      ALT                      25                  06/06/2022                AST                      34                   06/06/2022                ALKPHOS                  202 (H)             06/06/2022                BILITOT                  1.3 (H)             06/06/2022              Endo/Other  diabetes    Renal/GU Endophytic, 6.5 cm solid enhancing left renal mass with features concerning for renal cell carcinoma.   Lab Results      Component                Value  Date                      CREATININE               0.89                08/05/2022                Musculoskeletal  (+) Arthritis ,    Abdominal   Peds  Hematology negative hematology ROS (+) Lab Results      Component                Value               Date                      WBC                      6.3                 08/05/2022                HGB                      12.4                08/05/2022                HCT                      39.5                08/05/2022                MCV                      95.2                08/05/2022                PLT                      179                 08/05/2022              Anesthesia Other Findings   Reproductive/Obstetrics                              Anesthesia Physical Anesthesia Plan  ASA: 3  Anesthesia Plan: General   Post-op Pain Management: Ketamine IV*   Induction: Intravenous  PONV Risk Score and Plan: 4 or greater and Ondansetron and Dexamethasone  Airway Management Planned: Oral ETT  Additional Equipment: None  Intra-op Plan:   Post-operative Plan: Extubation in OR  Informed Consent: I have reviewed the patients History and Physical, chart, labs and discussed the procedure including the risks, benefits and alternatives for the proposed anesthesia with the patient or authorized representative who has indicated his/her understanding and acceptance.     Dental advisory given  Plan Discussed with: CRNA  Anesthesia Plan Comments: (See PAT note 08/05/2022)        Anesthesia Quick Evaluation

## 2022-08-08 ENCOUNTER — Other Ambulatory Visit: Payer: Self-pay | Admitting: Hematology and Oncology

## 2022-08-08 ENCOUNTER — Other Ambulatory Visit: Payer: Self-pay

## 2022-08-08 ENCOUNTER — Inpatient Hospital Stay: Payer: PPO | Attending: Physician Assistant | Admitting: Hematology and Oncology

## 2022-08-08 VITALS — BP 135/50 | HR 54 | Temp 97.9°F | Resp 18 | Wt 200.8 lb

## 2022-08-08 DIAGNOSIS — Z90710 Acquired absence of both cervix and uterus: Secondary | ICD-10-CM | POA: Insufficient documentation

## 2022-08-08 DIAGNOSIS — I509 Heart failure, unspecified: Secondary | ICD-10-CM | POA: Insufficient documentation

## 2022-08-08 DIAGNOSIS — E1142 Type 2 diabetes mellitus with diabetic polyneuropathy: Secondary | ICD-10-CM | POA: Insufficient documentation

## 2022-08-08 DIAGNOSIS — N2889 Other specified disorders of kidney and ureter: Secondary | ICD-10-CM | POA: Diagnosis not present

## 2022-08-08 DIAGNOSIS — R918 Other nonspecific abnormal finding of lung field: Secondary | ICD-10-CM | POA: Diagnosis not present

## 2022-08-08 DIAGNOSIS — K389 Disease of appendix, unspecified: Secondary | ICD-10-CM

## 2022-08-08 DIAGNOSIS — E1165 Type 2 diabetes mellitus with hyperglycemia: Secondary | ICD-10-CM | POA: Diagnosis not present

## 2022-08-08 DIAGNOSIS — K388 Other specified diseases of appendix: Secondary | ICD-10-CM | POA: Diagnosis not present

## 2022-08-08 NOTE — Progress Notes (Signed)
[place] [phone]   [phone]  PROGRESS NOTE  Patient Care Team: Jinny Sanders, MD as PCP - General (Family Medicine) Lorretta Harp, MD as PCP - Cardiology (Cardiology) Jinny Sanders, MD (Family Medicine)  Hematological/Oncological History # Left Renal Mass # Dilated Appendix 06/05/2022: establish care with Dr. Lorenso Courier   Interval History:  [name] [name] 77 y.o. female with medical history significant for a left renal mass, dilated appendix who presents for a follow up visit. The patient's last visit was on 06/05/2022 at which time she established care.. In the interim since the last visit she underwent CT evaluation of the chest which shows numerous nodules.  Additionally she has been evaluated by surgery and is going to undergo a joint procedure with general surgery and nephrology for removal of these abnormalities.  On exam today [name] [name] is accompanied by her husband.  She reports that things have been "up-and-down" in the interim since our last visit.  She has had some steroid shots in her hips to try to improve her pain but reports that it has not provided much relief.  She notes that she has had no recent infectious symptoms such as fevers, chills, sweats, nausea, vomiting or diarrhea.  She has been having some more difficulty controlling her blood sugar levels.  She recently transitioned from her pump back to shots and has been having some difficulty controlling her blood since this transition.  She notes that she is not having any abdominal pain and denies any shortness of breath or cough.  Otherwise she is at her baseline level of health.  She denies any lightheadedness, dizziness, or shortness of breath.  A full 10 point ROS was otherwise negative.  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   Complication of anesthesia    " i WAKE UP WITH TREMORS " after shoulder surgery tremors for 10 months after surgery surgery- 2009- surgical center in    Cyst and pseudocyst of pancreas    Depressive disorder, not elsewhere classified    Diabetes mellitus without complication ([place])    Diverticulitis    Diverticulosis of colon (without mention of hemorrhage)    Dyspnea    Dysrhythmia    arrhythmia   Esophageal reflux    Essential and other specified forms of tremor    History of GI bleed    History of kidney stones    Irritable bowel syndrome    Obesity, unspecified    Other specified cardiac dysrhythmias(427.89)    Periapical abscess without sinus    Polyneuropathy in diabetes(357.2)    PONV (postoperative nausea and vomiting)    Pure hypercholesterolemia    Sleep apnea    CPAP- non compliant   Type II or unspecified type diabetes mellitus with neurological manifestations, not stated as uncontrolled(250.60)     SURGICAL HISTORY: Past Surgical History:  Procedure Laterality Date   ABDOMINAL HYSTERECTOMY     CALCANEAL OSTEOTOMY Right 07/27/2015   Procedure: RIGHT CALCANEAL OSTEOTOMY ;  Surgeon: Wylene Simmer, MD;  Location: [facility];  Service: Orthopedics;  Laterality: Right;   CHOLECYSTECTOMY     15-20 years  ago   COLONOSCOPY WITH PROPOFOL N/A 08/17/2019   Procedure: COLONOSCOPY WITH PROPOFOL;  Surgeon: Carol Ada, MD;  Location: [facility];  Service: Endoscopy;  Laterality: N/A;   COLONOSCOPY WITH PROPOFOL N/A 06/21/2022   Procedure: COLONOSCOPY WITH PROPOFOL;  Surgeon: Carol Ada, MD;  Location: [facility];  Service: Gastroenterology;  Laterality: N/A;   ESOPHAGOGASTRODUODENOSCOPY (EGD) WITH PROPOFOL N/A 08/17/2019   Procedure: ESOPHAGOGASTRODUODENOSCOPY (EGD) WITH PROPOFOL;  Surgeon: Carol Ada, MD;  Location: [facility];  Service: Endoscopy;   Laterality: N/A;   GASTROCNEMIUS RECESSION Right 07/27/2015   Procedure: RIGHT GASTROC RECESSION;  Surgeon: Wylene Simmer, MD;  Location: [facility];  Service: Orthopedics;  Laterality: Right;   KIDNEY STONE SURGERY  1980's   removal   LITHOTRIPSY     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   PARTIAL HYSTERECTOMY  1990's   POLYPECTOMY  08/17/2019   Procedure: POLYPECTOMY;  Surgeon: Carol Ada, MD;  Location[facility];  Service: Endoscopy;;   POLYPECTOMY  06/21/2022   Procedure: POLYPECTOMY;  Surgeon: Carol Ada, MD;  Location: [facility];  Service: Gastroenterology;;   Cherre Robins CUFF REPAIR  2005    SOCIAL HISTORY: Social History   Socioeconomic History   Marital status: Widowed    Spouse name: Not on file   Number of children: 5   Years of education: Not on file   Highest education level: Not on file  Occupational History    Employer: RETIRED  Tobacco Use   Smoking status: Never   Smokeless tobacco: Never  Vaping Use   Vaping Use: Never used  Substance and Sexual Activity   Alcohol use: No    Alcohol/week: 0.0 standard drinks of alcohol   Drug use: No   Sexual activity: Not on file  Other Topics Concern   Not on file  Social History Narrative   Husband treated for testicular cancer, 2 cups coffee a day, no exercise   Social Determinants of Health   Financial Resource Strain: Low Risk  (01/14/2022)   Overall Financial Resource Strain (CARDIA)    Difficulty of Paying Living Expenses: Not very hard  Food Insecurity: No Food Insecurity (01/14/2022)   Hunger Vital Sign    Worried About Running Out of Food in the Last Year: Never true    Ran Out of Food in the Last Year: Never true  Transportation Needs: No Transportation Needs (01/14/2022)   PRAPARE - Hydrologist (Medical): No    Lack of Transportation (Non-Medical): No  Physical Activity: Inactive (01/14/2022)   Exercise Vital Sign    Days of Exercise per Week: 0 days    Minutes of Exercise  per Session: 0 min  Stress: No Stress Concern Present (01/14/2022)   [place]    Feeling of Stress : Only a little  Social Connections: Not on file  Intimate Partner Violence: Not on file    FAMILY HISTORY: Family History  Problem Relation Age of Onset   Clotting disorder Mother    Cirrhosis Father    Diabetes Other        2 aunts   Cancer Other        grandmother   Allergies Other        "whole family"   Asthma Other        "whole family"    ALLERGIES:  is allergic to doxycycline, epinephrine hcl, erythromycin ethylsuccinate, iohexol, metformin, oxycodone, and tape.  MEDICATIONS:  Current Outpatient Medications  Medication  Sig Dispense Refill   albuterol (PROAIR HFA) 108 (90 Base) MCG/ACT inhaler Inhale 1-2 puffs into the lungs every 6 (six) hours as needed for wheezing or shortness of breath. 1 each 2   atenolol (TENORMIN) 25 MG tablet TAKE 1/2 TO 1 TABLET BY MOUTH 2 (TWO) TIMES DAILY. (Patient taking differently: Take 25 mg by mouth 2 (two) times daily.) 180 tablet 0   azaTHIOprine (IMURAN) 50 MG tablet Take 50 mg by mouth 2 (two) times daily.     cetirizine (ZYRTEC) 10 MG tablet Take 10 mg by mouth at bedtime.     Continuous Blood Gluc Receiver (FREESTYLE LIBRE 2 READER) DEVI See admin instructions.     Continuous Blood Gluc Sensor (FREESTYLE LIBRE 2 SENSOR) MISC See admin instructions.     furosemide (LASIX) 40 MG tablet Take 40 mg by mouth daily.     glucose blood test strip OneTouch Ultra Blue Test Strip  TEST 3 TIMES DAILY E11.65     halobetasol (ULTRAVATE) 0.05 % cream Apply 1 Application topically 2 (two) times daily as needed (itching).     insulin lispro (HUMALOG) 100 UNIT/ML injection Inject into the skin See admin instructions. Via Insulin Pump     mometasone (NASONEX) 50 MCG/ACT nasal spray Place 2 sprays into the nose daily.     pantoprazole (PROTONIX) 40 MG tablet TAKE 1 TABLET BY MOUTH EVERY  DAY 30 tablet 0   Polyethyl Glyc-Propyl Glyc PF (SYSTANE HYDRATION PF) 0.4-0.3 % SOLN Place 1 drop into both eyes as needed (dry eyes).     predniSONE (DELTASONE) 10 MG tablet Take 10 mg by mouth daily with breakfast.     predniSONE (DELTASONE) 50 MG tablet Take 1 tablet 13 hours, 7 hours and 1 hour prior to CT scan injection 3 tablet 0   Coenzyme Q10 100 MG TABS Take 100 mg by mouth daily. (Patient not taking: Reported on 08/08/2022)     Lactobacillus Rhamnosus, GG, (CULTURELLE PO) Take 1 capsule by mouth daily. (Patient not taking: Reported on 08/08/2022)     MAGNESIUM PO Take 150 mg by mouth 2 (two) times daily. (Patient not taking: Reported on 08/08/2022)     Turmeric (QC TUMERIC COMPLEX PO) Take 500 mg by mouth daily. (Patient not taking: Reported on 08/08/2022)     UNABLE TO FIND Take 1 tablet by mouth daily. Med Name: Heide Scales Vitamin (Patient not taking: Reported on 08/08/2022)     zinc gluconate 50 MG tablet Take 25 mg by mouth in the morning and at bedtime. (Patient not taking: Reported on 08/08/2022)     No current facility-administered medications for this visit.    REVIEW OF SYSTEMS:   Constitutional: ( - ) fevers, ( - )  chills , ( - ) night sweats Eyes: ( - ) blurriness of vision, ( - ) double vision, ( - ) watery eyes Ears, nose, mouth, throat, and face: ( - ) mucositis, ( - ) sore throat Respiratory: ( - ) cough, ( - ) dyspnea, ( - ) wheezes Cardiovascular: ( - ) palpitation, ( - ) chest discomfort, ( - ) lower extremity swelling Gastrointestinal:  ( - ) nausea, ( - ) heartburn, ( - ) change in bowel habits Skin: ( - ) abnormal skin rashes Lymphatics: ( - ) new lymphadenopathy, ( - ) easy bruising Neurological: ( - ) numbness, ( - ) tingling, ( - ) new weaknesses Behavioral/Psych: ( - ) mood change, ( - ) new changes  All other systems  were reviewed with the patient and are negative.  PHYSICAL EXAMINATION:  Vitals:   08/08/22 0836  BP: (!) 135/50  Pulse: (!) 54  Resp: 18   Temp: 97.9 F (36.6 C)  SpO2: 98%   Filed Weights   08/08/22 0836  Weight: 200 lb 12.8 oz (91.1 kg)    GENERAL: Well-appearing elderly Caucasian female, alert, no distress and comfortable SKIN: skin color, texture, turgor are normal, no rashes or significant lesions EYES: conjunctiva are pink and non-injected, sclera clear LUNGS: clear to auscultation and percussion with normal breathing effort HEART: regular rate & rhythm and no murmurs and no lower extremity edema Musculoskeletal: no cyanosis of digits and no clubbing  PSYCH: alert & oriented x 3, fluent speech NEURO: no focal motor/sensory deficits  LABORATORY DATA:  I have reviewed the data as listed    Latest Ref Rng & Units 08/05/2022    1:40 PM 06/06/2022   10:02 AM 05/17/2020    2:59 AM  CBC  WBC 4.0 - 10.5 K/uL 6.3  9.5  6.4   Hemoglobin 12.0 - 15.0 g/dL 12.4  12.3  12.7   Hematocrit 36.0 - 46.0 % 39.5  38.4  38.8   Platelets 150 - 400 K/uL 179  178  158        Latest Ref Rng & Units 08/05/2022    1:40 PM 06/06/2022   10:02 AM 05/17/2020    2:59 AM  CMP  Glucose 70 - 99 mg/dL 368  256  117   BUN 8 - 23 mg/dL '19  22  23   '$ Creatinine 0.44 - 1.00 mg/dL 0.89  0.81  0.91   Sodium 135 - 145 mmol/L 138  141  139   Potassium 3.5 - 5.1 mmol/L 4.3  4.5  3.3   Chloride 98 - 111 mmol/L 102  104  104   CO2 22 - 32 mmol/L 25  32  27   Calcium 8.9 - 10.3 mg/dL 9.5  9.3  9.4   Total Protein 6.5 - 8.1 g/dL  6.5    Total Bilirubin 0.3 - 1.2 mg/dL  1.3    Alkaline Phos 38 - 126 U/L  202    AST 15 - 41 U/L  34    ALT 0 - 44 U/L  25     RADIOGRAPHIC STUDIES: I have personally reviewed the radiological images as listed and agreed with the findings in the report: Lung nodules, most prominently in the left lower lung. CT Chest Wo Contrast  Result Date: 08/05/2022 CLINICAL DATA:  Follow up nodules EXAM: CT CHEST WITHOUT CONTRAST TECHNIQUE: Multidetector CT imaging of the chest was performed following the standard protocol  without IV contrast. RADIATION DOSE REDUCTION: This exam was performed according to the departmental dose-optimization program which includes automated exposure control, adjustment of the mA and/or kV according to patient size and/or use of iterative reconstruction technique. COMPARISON:  06/17/2022 FINDINGS: Cardiovascular: Atheromatous changes of the aorta and its branches. No cardiomegaly or pericardial effusion. Mediastinum/Nodes: No enlarged mediastinal or axillary lymph nodes. Thyroid gland, trachea, and esophagus demonstrate no significant findings. Lungs/Pleura: No pneumonia or pulmonary edema identified. Numerous areas of scarring and nodules bilaterally. Dominant nodule right lower lobe measures 1.5 cm unchanged. Largest nodule on the left in the medial left lower lobe measures 1.2 cm and now demonstrates some central cavitation. No new nodules are seen. Upper Abdomen: Left kidney mass consistent with known neoplasm was partially included on the field-of-view. Imaged portion measures 6.7 cm.  Musculoskeletal: No chest wall mass or suspicious bone lesions identified. IMPRESSION: 1. Numerous bilateral pulmonary nodules concerning for metastatic disease without interval change. 2. Otherwise no cardiopulmonary process identified. 3. Left kidney mass only partially imaged on this examination. Electronically Signed   By: Sammie Bench M.D.   On: 08/05/2022 11:32   ECHOCARDIOGRAM COMPLETE  Result Date: 07/16/2022    ECHOCARDIOGRAM REPORT   Patient Name:   [name] [name] Date of Exam: 07/16/2022 Medical Rec #:  [id number]     Height:       63.0 in Accession #:    [id number]    Weight:       211.0 lb Date of Birth:  December 21, 1945      BSA:          1.978 m Patient Age:    17 years      BP:           120/72 mmHg Patient Gender: F             HR:           56 bpm. Exam Location:  [place] Procedure: 2D Echo, Cardiac Doppler and Color Doppler Indications:    Dyspnea R06.09  History:        Patient has prior  history of Echocardiogram examinations, most                 recent 01/17/2017. Diastolic CHF, Signs/Symptoms:Dyspnea and                 Palpitations; Risk Factors:Dyslipidemia, Diabetes and Sleep                 Apnea. Previous echo revealed LVEF 60%.  Sonographer:    Lenard Galloway BA, RDCS Referring Phys: HAO MENG IMPRESSIONS  1. Left ventricular ejection fraction, by estimation, is 60 to 65%. The left ventricle has normal function. The left ventricle has no regional wall motion abnormalities. Left ventricular diastolic parameters are consistent with Grade I diastolic dysfunction (impaired relaxation).  2. Right ventricular systolic function is normal. The right ventricular size is normal. Tricuspid regurgitation signal is inadequate for assessing PA pressure.  3. The mitral valve is degenerative. Trivial mitral valve regurgitation. Moderate mitral annular calcification.  4. The aortic valve was not well visualized. Aortic valve regurgitation is not visualized. Aortic valve sclerosis/calcification is present, without any evidence of aortic stenosis.  5. The inferior vena cava is normal in size with greater than 50% respiratory variability, suggesting right atrial pressure of 3 mmHg. Comparison(s): No significant change from prior study. FINDINGS  Left Ventricle: Left ventricular ejection fraction, by estimation, is 60 to 65%. The left ventricle has normal function. The left ventricle has no regional wall motion abnormalities. The left ventricular internal cavity size was normal in size. There is  no left ventricular hypertrophy. Left ventricular diastolic parameters are consistent with Grade I diastolic dysfunction (impaired relaxation). Right Ventricle: The right ventricular size is normal. Right vetricular wall thickness was not well visualized. Right ventricular systolic function is normal. Tricuspid regurgitation signal is inadequate for assessing PA pressure. Left Atrium: Left atrial size was normal in size.  Right Atrium: Right atrial size was normal in size. Pericardium: There is no evidence of pericardial effusion. Mitral Valve: The mitral valve is degenerative in appearance. There is mild thickening of the mitral valve leaflet(s). There is mild calcification of the mitral valve leaflet(s). Moderate mitral annular calcification. Trivial mitral valve regurgitation. Tricuspid Valve: The tricuspid valve is normal in  structure. Tricuspid valve regurgitation is trivial. Aortic Valve: The aortic valve was not well visualized. Aortic valve regurgitation is not visualized. Aortic valve sclerosis/calcification is present, without any evidence of aortic stenosis. Pulmonic Valve: The pulmonic valve was not well visualized. Aorta: The aortic root is normal in size and structure. Venous: The inferior vena cava is normal in size with greater than 50% respiratory variability, suggesting right atrial pressure of 3 mmHg. IAS/Shunts: The atrial septum is grossly normal.  LEFT VENTRICLE PLAX 2D LVIDd:         5.30 cm   Diastology LVIDs:         2.90 cm   LV e' medial:    8.38 cm/s LV PW:         0.60 cm   LV E/e' medial:  10.4 LV IVS:        0.80 cm   LV e' lateral:   9.68 cm/s LVOT diam:     2.00 cm   LV E/e' lateral: 9.0 LV SV:         90 LV SV Index:   45 LVOT Area:     3.14 cm  RIGHT VENTRICLE RV Basal diam:  3.30 cm RV Mid diam:    2.20 cm RV S prime:     18.80 cm/s TAPSE (M-mode): 3.2 cm LEFT ATRIUM             Index        RIGHT ATRIUM          Index LA diam:        4.00 cm 2.02 cm/m   RA Area:     9.54 cm LA Vol (A2C):   40.2 ml 20.32 ml/m  RA Volume:   17.70 ml 8.95 ml/m LA Vol (A4C):   44.1 ml 22.29 ml/m LA Biplane Vol: 42.0 ml 21.23 ml/m  AORTIC VALVE LVOT Vmax:   134.00 cm/s LVOT Vmean:  85.600 cm/s LVOT VTI:    0.286 m  AORTA Ao Root diam: 3.00 cm MITRAL VALVE MV Area (PHT): 2.59 cm     SHUNTS MV Decel Time: 293 msec     Systemic VTI:  0.29 m MV E velocity: 87.00 cm/s   Systemic Diam: 2.00 cm MV A velocity: 113.00  cm/s MV E/A ratio:  0.77 Gwyndolyn Kaufman MD Electronically signed by Gwyndolyn Kaufman MD Signature Date/Time: 07/16/2022/7:27:39 PM    Final     ASSESSMENT & PLAN [name] [name] 77 y.o. female with medical history significant for a left renal mass, dilated appendix who presents for a follow up visit.   #Large left renal mass: #Focally dilated appendix: --We reviewed CT abdomen/pelvis from 05/29/2022 and findings are concerning for renal cell carcinoma.  --She is scheduled for robotic assisted laparoscopic appendectomy and nephrectomy joint procedure on 08/14/2022. --Will follow-up results of pathology. --Etiology of the small lung nodules is unclear, will continue to monitor.  Follow-up strategy will depend on the results of the upcoming surgeries. --Encouraged good glycemic control prior to surgery. --Labs today show white blood cell count 6.3, hemoglobin 12.4, MCV 95.2, and platelets of 179. Cr and LFTs are WNL.  --Follow-up visit in 5 weeks time.  No orders of the defined types were placed in this encounter.   All questions were answered. The patient knows to call the clinic with any problems, questions or concerns.  A total of more than 30 minutes were spent on this encounter with face-to-face time and non-face-to-face time, including preparing to see the patient,  ordering tests and/or medications, counseling the patient and coordination of care as outlined above.   Ledell Peoples, MD Department of Hematology/Oncology [facility] Phone: [phone] Pager: [phone] Email: [email or web address]  08/08/2022 5:00 PM

## 2022-08-09 ENCOUNTER — Telehealth: Payer: Self-pay | Admitting: Hematology and Oncology

## 2022-08-09 NOTE — Telephone Encounter (Signed)
Called patient per 1/18 los notes. Patient scheduled and notified.

## 2022-08-14 ENCOUNTER — Inpatient Hospital Stay (HOSPITAL_COMMUNITY)
Admission: RE | Admit: 2022-08-14 | Discharge: 2022-08-16 | DRG: 657 | Disposition: A | Discharge status: Home / Self Care | Payer: PPO | Attending: Urology | Admitting: Urology

## 2022-08-14 ENCOUNTER — Inpatient Hospital Stay (HOSPITAL_COMMUNITY): Payer: PPO | Admitting: Anesthesiology

## 2022-08-14 ENCOUNTER — Encounter (HOSPITAL_COMMUNITY): Payer: Self-pay | Admitting: General Surgery

## 2022-08-14 ENCOUNTER — Inpatient Hospital Stay (HOSPITAL_COMMUNITY): Payer: PPO | Admitting: Vascular Surgery

## 2022-08-14 ENCOUNTER — Other Ambulatory Visit: Payer: Self-pay

## 2022-08-14 ENCOUNTER — Encounter (HOSPITAL_COMMUNITY)
Admission: RE | Disposition: A | Discharge status: Home / Self Care | Payer: Self-pay | Source: Home / Self Care | Attending: Urology

## 2022-08-14 DIAGNOSIS — F418 Other specified anxiety disorders: Secondary | ICD-10-CM | POA: Diagnosis not present

## 2022-08-14 DIAGNOSIS — Z808 Family history of malignant neoplasm of other organs or systems: Secondary | ICD-10-CM | POA: Diagnosis not present

## 2022-08-14 DIAGNOSIS — K388 Other specified diseases of appendix: Secondary | ICD-10-CM

## 2022-08-14 DIAGNOSIS — I5022 Chronic systolic (congestive) heart failure: Secondary | ICD-10-CM | POA: Diagnosis not present

## 2022-08-14 DIAGNOSIS — J45909 Unspecified asthma, uncomplicated: Secondary | ICD-10-CM | POA: Diagnosis present

## 2022-08-14 DIAGNOSIS — Z885 Allergy status to narcotic agent status: Secondary | ICD-10-CM | POA: Diagnosis not present

## 2022-08-14 DIAGNOSIS — Z8249 Family history of ischemic heart disease and other diseases of the circulatory system: Secondary | ICD-10-CM | POA: Diagnosis not present

## 2022-08-14 DIAGNOSIS — I110 Hypertensive heart disease with heart failure: Secondary | ICD-10-CM | POA: Diagnosis present

## 2022-08-14 DIAGNOSIS — I8500 Esophageal varices without bleeding: Secondary | ICD-10-CM | POA: Diagnosis present

## 2022-08-14 DIAGNOSIS — Z881 Allergy status to other antibiotic agents status: Secondary | ICD-10-CM | POA: Diagnosis not present

## 2022-08-14 DIAGNOSIS — K36 Other appendicitis: Secondary | ICD-10-CM | POA: Diagnosis not present

## 2022-08-14 DIAGNOSIS — Z79899 Other long term (current) drug therapy: Secondary | ICD-10-CM | POA: Diagnosis not present

## 2022-08-14 DIAGNOSIS — N2889 Other specified disorders of kidney and ureter: Secondary | ICD-10-CM | POA: Diagnosis not present

## 2022-08-14 DIAGNOSIS — Z888 Allergy status to other drugs, medicaments and biological substances status: Secondary | ICD-10-CM

## 2022-08-14 DIAGNOSIS — I509 Heart failure, unspecified: Secondary | ICD-10-CM

## 2022-08-14 DIAGNOSIS — G4733 Obstructive sleep apnea (adult) (pediatric): Secondary | ICD-10-CM | POA: Diagnosis present

## 2022-08-14 DIAGNOSIS — Z01818 Encounter for other preprocedural examination: Secondary | ICD-10-CM

## 2022-08-14 DIAGNOSIS — M8580 Other specified disorders of bone density and structure, unspecified site: Secondary | ICD-10-CM | POA: Diagnosis not present

## 2022-08-14 DIAGNOSIS — E1142 Type 2 diabetes mellitus with diabetic polyneuropathy: Secondary | ICD-10-CM | POA: Diagnosis not present

## 2022-08-14 DIAGNOSIS — K7460 Unspecified cirrhosis of liver: Secondary | ICD-10-CM | POA: Diagnosis not present

## 2022-08-14 DIAGNOSIS — N3289 Other specified disorders of bladder: Secondary | ICD-10-CM | POA: Diagnosis not present

## 2022-08-14 DIAGNOSIS — D373 Neoplasm of uncertain behavior of appendix: Secondary | ICD-10-CM | POA: Diagnosis not present

## 2022-08-14 DIAGNOSIS — Z8379 Family history of other diseases of the digestive system: Secondary | ICD-10-CM

## 2022-08-14 DIAGNOSIS — K7210 Chronic hepatic failure without coma: Secondary | ICD-10-CM | POA: Diagnosis present

## 2022-08-14 DIAGNOSIS — M1990 Unspecified osteoarthritis, unspecified site: Secondary | ICD-10-CM | POA: Diagnosis present

## 2022-08-14 DIAGNOSIS — C641 Malignant neoplasm of right kidney, except renal pelvis: Secondary | ICD-10-CM | POA: Diagnosis not present

## 2022-08-14 DIAGNOSIS — K219 Gastro-esophageal reflux disease without esophagitis: Secondary | ICD-10-CM | POA: Diagnosis present

## 2022-08-14 DIAGNOSIS — K580 Irritable bowel syndrome with diarrhea: Secondary | ICD-10-CM | POA: Diagnosis present

## 2022-08-14 DIAGNOSIS — C642 Malignant neoplasm of left kidney, except renal pelvis: Secondary | ICD-10-CM | POA: Diagnosis not present

## 2022-08-14 DIAGNOSIS — Z9660 Presence of unspecified orthopedic joint implant: Secondary | ICD-10-CM | POA: Diagnosis present

## 2022-08-14 DIAGNOSIS — E7800 Pure hypercholesterolemia, unspecified: Secondary | ICD-10-CM | POA: Diagnosis present

## 2022-08-14 DIAGNOSIS — Z91041 Radiographic dye allergy status: Secondary | ICD-10-CM

## 2022-08-14 DIAGNOSIS — Z794 Long term (current) use of insulin: Secondary | ICD-10-CM

## 2022-08-14 HISTORY — PX: ROBOT ASSISTED LAPAROSCOPIC NEPHRECTOMY: SHX5140

## 2022-08-14 HISTORY — PX: XI ROBOTIC LAPAROSCOPIC ASSISTED APPENDECTOMY: SHX6877

## 2022-08-14 LAB — HEMOGLOBIN AND HEMATOCRIT, BLOOD
HCT: 37.2 % (ref 36.0–46.0)
Hemoglobin: 11.7 g/dL — ABNORMAL LOW (ref 12.0–15.0)

## 2022-08-14 LAB — TYPE AND SCREEN
ABO/RH(D): A POS
Antibody Screen: NEGATIVE

## 2022-08-14 LAB — GLUCOSE, CAPILLARY
Glucose-Capillary: 222 mg/dL — ABNORMAL HIGH (ref 70–99)
Glucose-Capillary: 205 mg/dL — ABNORMAL HIGH (ref 70–99)
Glucose-Capillary: 209 mg/dL — ABNORMAL HIGH (ref 70–99)
Glucose-Capillary: 211 mg/dL — ABNORMAL HIGH (ref 70–99)

## 2022-08-14 SURGERY — NEPHRECTOMY, RADICAL, ROBOT-ASSISTED, LAPAROSCOPIC, ADULT
Anesthesia: General | Laterality: Left

## 2022-08-14 SURGERY — APPENDECTOMY, ROBOT-ASSISTED, LAPAROSCOPIC
Anesthesia: General

## 2022-08-14 MED ORDER — CEFAZOLIN SODIUM-DEXTROSE 2-4 GM/100ML-% IV SOLN
2.0000 g | INTRAVENOUS | Status: AC
  Administered 2022-08-14: 2 g via INTRAVENOUS
  Filled 2022-08-14: qty 100

## 2022-08-14 MED ORDER — STERILE WATER FOR IRRIGATION IR SOLN
Status: DC | PRN
  Administered 2022-08-14: 1000 mL

## 2022-08-14 MED ORDER — DEXAMETHASONE SODIUM PHOSPHATE 10 MG/ML IJ SOLN
INTRAMUSCULAR | Status: AC
  Filled 2022-08-14: qty 1

## 2022-08-14 MED ORDER — SODIUM CHLORIDE (PF) 0.9 % IJ SOLN
INTRAMUSCULAR | Status: DC | PRN
  Administered 2022-08-14: 20 mL

## 2022-08-14 MED ORDER — SUGAMMADEX SODIUM 200 MG/2ML IV SOLN
INTRAVENOUS | Status: DC | PRN
  Administered 2022-08-14: 200 mg via INTRAVENOUS

## 2022-08-14 MED ORDER — ORAL CARE MOUTH RINSE: 15.0000 mL | Freq: Once | OROMUCOSAL | Status: AC

## 2022-08-14 MED ORDER — METRONIDAZOLE 500 MG/100ML IV SOLN
500.0000 mg | INTRAVENOUS | Status: AC
  Administered 2022-08-14: 500 mg via INTRAVENOUS
  Filled 2022-08-14: qty 100

## 2022-08-14 MED ORDER — ROCURONIUM BROMIDE 10 MG/ML (PF) SYRINGE
PREFILLED_SYRINGE | INTRAVENOUS | Status: AC
  Filled 2022-08-14: qty 10

## 2022-08-14 MED ORDER — BUPIVACAINE HCL 0.25 % IJ SOLN
INTRAMUSCULAR | Status: AC
  Filled 2022-08-14: qty 1

## 2022-08-14 MED ORDER — DIPHENHYDRAMINE HCL 12.5 MG/5ML PO ELIX: 12.5000 mg | ORAL_SOLUTION | Freq: Four times a day (QID) | ORAL | Status: DC | PRN

## 2022-08-14 MED ORDER — PROPOFOL 10 MG/ML IV BOLUS
INTRAVENOUS | Status: AC
  Filled 2022-08-14: qty 20

## 2022-08-14 MED ORDER — LIDOCAINE HCL (PF) 2 % IJ SOLN
INTRAMUSCULAR | Status: AC
  Filled 2022-08-14: qty 5

## 2022-08-14 MED ORDER — BUPIVACAINE LIPOSOME 1.3 % IJ SUSP
INTRAMUSCULAR | Status: AC
  Filled 2022-08-14: qty 20

## 2022-08-14 MED ORDER — ONDANSETRON HCL 4 MG/2ML IJ SOLN
INTRAMUSCULAR | Status: AC
  Filled 2022-08-14: qty 2

## 2022-08-14 MED ORDER — ROCURONIUM BROMIDE 10 MG/ML (PF) SYRINGE
PREFILLED_SYRINGE | INTRAVENOUS | Status: DC | PRN
  Administered 2022-08-14: 20 mg via INTRAVENOUS
  Administered 2022-08-14: 80 mg via INTRAVENOUS
  Administered 2022-08-14: 30 mg via INTRAVENOUS

## 2022-08-14 MED ORDER — AZATHIOPRINE 50 MG PO TABS
50.0000 mg | ORAL_TABLET | Freq: Two times a day (BID) | ORAL | Status: DC
  Administered 2022-08-14 – 2022-08-16 (×4): 50 mg via ORAL
  Filled 2022-08-14 (×4): qty 1

## 2022-08-14 MED ORDER — HYOSCYAMINE SULFATE 0.125 MG SL SUBL: 0.1250 mg | SUBLINGUAL_TABLET | SUBLINGUAL | Status: DC | PRN

## 2022-08-14 MED ORDER — BUPIVACAINE LIPOSOME 1.3 % IJ SUSP
INTRAMUSCULAR | Status: DC | PRN
  Administered 2022-08-14: 20 mL

## 2022-08-14 MED ORDER — PROPOFOL 10 MG/ML IV BOLUS
INTRAVENOUS | Status: DC | PRN
  Administered 2022-08-14: 150 mg via INTRAVENOUS

## 2022-08-14 MED ORDER — SODIUM CHLORIDE (PF) 0.9 % IJ SOLN
INTRAMUSCULAR | Status: AC
  Filled 2022-08-14: qty 20

## 2022-08-14 MED ORDER — DEXAMETHASONE SODIUM PHOSPHATE 10 MG/ML IJ SOLN
INTRAMUSCULAR | Status: DC | PRN
  Administered 2022-08-14: 4 mg via INTRAVENOUS

## 2022-08-14 MED ORDER — FENTANYL CITRATE (PF) 250 MCG/5ML IJ SOLN
INTRAMUSCULAR | Status: AC
  Filled 2022-08-14: qty 5

## 2022-08-14 MED ORDER — PHENYLEPHRINE 80 MCG/ML (10ML) SYRINGE FOR IV PUSH (FOR BLOOD PRESSURE SUPPORT)
PREFILLED_SYRINGE | INTRAVENOUS | Status: DC | PRN
  Administered 2022-08-14: 240 ug via INTRAVENOUS
  Administered 2022-08-14: 160 ug via INTRAVENOUS

## 2022-08-14 MED ORDER — DIPHENHYDRAMINE HCL 50 MG/ML IJ SOLN: 12.5000 mg | Freq: Four times a day (QID) | INTRAMUSCULAR | Status: DC | PRN

## 2022-08-14 MED ORDER — TRAMADOL HCL 50 MG PO TABS
50.0000 mg | ORAL_TABLET | Freq: Once | ORAL | Status: AC | PRN
  Administered 2022-08-14: 50 mg via ORAL

## 2022-08-14 MED ORDER — CHLORHEXIDINE GLUCONATE 0.12 % MT SOLN
15.0000 mL | Freq: Once | OROMUCOSAL | Status: AC
  Administered 2022-08-14: 15 mL via OROMUCOSAL

## 2022-08-14 MED ORDER — LACTATED RINGERS IV SOLN: INTRAVENOUS | Status: DC | PRN

## 2022-08-14 MED ORDER — ALBUTEROL SULFATE (2.5 MG/3ML) 0.083% IN NEBU: 2.5000 mg | INHALATION_SOLUTION | Freq: Four times a day (QID) | RESPIRATORY_TRACT | Status: DC | PRN

## 2022-08-14 MED ORDER — EPHEDRINE 5 MG/ML INJ
INTRAVENOUS | Status: AC
  Filled 2022-08-14: qty 5

## 2022-08-14 MED ORDER — ATENOLOL 25 MG PO TABS
25.0000 mg | ORAL_TABLET | Freq: Two times a day (BID) | ORAL | Status: DC
  Administered 2022-08-14 – 2022-08-16 (×4): 25 mg via ORAL
  Filled 2022-08-14 (×4): qty 1

## 2022-08-14 MED ORDER — LACTATED RINGERS IR SOLN
Status: DC | PRN
  Administered 2022-08-14: 1000 mL

## 2022-08-14 MED ORDER — FENTANYL CITRATE PF 50 MCG/ML IJ SOSY: 25.0000 ug | PREFILLED_SYRINGE | INTRAMUSCULAR | Status: DC | PRN

## 2022-08-14 MED ORDER — FENTANYL CITRATE (PF) 250 MCG/5ML IJ SOLN
INTRAMUSCULAR | Status: DC | PRN
  Administered 2022-08-14: 25 ug via INTRAVENOUS
  Administered 2022-08-14: 50 ug via INTRAVENOUS
  Administered 2022-08-14 (×3): 25 ug via INTRAVENOUS
  Administered 2022-08-14: 100 ug via INTRAVENOUS

## 2022-08-14 MED ORDER — PREDNISONE 5 MG PO TABS
10.0000 mg | ORAL_TABLET | Freq: Every day | ORAL | Status: DC
  Administered 2022-08-15 – 2022-08-16 (×2): 10 mg via ORAL
  Filled 2022-08-14 (×2): qty 2

## 2022-08-14 MED ORDER — ACETAMINOPHEN 500 MG PO TABS: 1000.0000 mg | ORAL_TABLET | Freq: Once | ORAL | Status: DC | PRN

## 2022-08-14 MED ORDER — LACTATED RINGERS IV SOLN: INTRAVENOUS | Status: DC

## 2022-08-14 MED ORDER — TRAMADOL HCL 50 MG PO TABS: 50.0000 mg | ORAL_TABLET | Freq: Four times a day (QID) | ORAL | 0 refills | Status: DC | PRN

## 2022-08-14 MED ORDER — CEFAZOLIN SODIUM-DEXTROSE 2-4 GM/100ML-% IV SOLN: 2.0000 g | INTRAVENOUS | Status: DC

## 2022-08-14 MED ORDER — INSULIN ASPART 100 UNIT/ML IJ SOLN
INTRAMUSCULAR | Status: AC
  Filled 2022-08-14: qty 1

## 2022-08-14 MED ORDER — TRAMADOL HCL 50 MG PO TABS
50.0000 mg | ORAL_TABLET | Freq: Four times a day (QID) | ORAL | Status: DC | PRN
  Administered 2022-08-15 (×2): 50 mg via ORAL
  Administered 2022-08-16 (×2): 100 mg via ORAL
  Filled 2022-08-14: qty 2
  Filled 2022-08-14 (×3): qty 1
  Filled 2022-08-14: qty 2

## 2022-08-14 MED ORDER — SODIUM CHLORIDE 0.45 % IV SOLN: INTRAVENOUS | Status: DC

## 2022-08-14 MED ORDER — INSULIN ASPART 100 UNIT/ML IJ SOLN
0.0000 [IU] | Freq: Three times a day (TID) | INTRAMUSCULAR | Status: DC
  Administered 2022-08-14: 7 [IU] via SUBCUTANEOUS
  Administered 2022-08-15: 4 [IU] via SUBCUTANEOUS
  Administered 2022-08-15 (×2): 11 [IU] via SUBCUTANEOUS
  Administered 2022-08-16: 7 [IU] via SUBCUTANEOUS

## 2022-08-14 MED ORDER — PANTOPRAZOLE SODIUM 40 MG PO TBEC
40.0000 mg | DELAYED_RELEASE_TABLET | Freq: Every day | ORAL | Status: DC
  Administered 2022-08-15 – 2022-08-16 (×2): 40 mg via ORAL
  Filled 2022-08-14 (×2): qty 1

## 2022-08-14 MED ORDER — ONDANSETRON HCL 4 MG/2ML IJ SOLN
INTRAMUSCULAR | Status: DC | PRN
  Administered 2022-08-14: 4 mg via INTRAVENOUS

## 2022-08-14 MED ORDER — LIDOCAINE 2% (20 MG/ML) 5 ML SYRINGE
INTRAMUSCULAR | Status: DC | PRN
  Administered 2022-08-14: 60 mg via INTRAVENOUS

## 2022-08-14 MED ORDER — ONDANSETRON HCL 4 MG/2ML IJ SOLN: 4.0000 mg | INTRAMUSCULAR | Status: DC | PRN

## 2022-08-14 MED ORDER — TRAMADOL HCL 50 MG PO TABS
ORAL_TABLET | ORAL | Status: AC
  Filled 2022-08-14: qty 1

## 2022-08-14 MED ORDER — DOCUSATE SODIUM 100 MG PO CAPS
100.0000 mg | ORAL_CAPSULE | Freq: Two times a day (BID) | ORAL | Status: DC
  Administered 2022-08-14 – 2022-08-16 (×2): 100 mg via ORAL
  Filled 2022-08-14 (×4): qty 1

## 2022-08-14 MED ORDER — HYDROMORPHONE HCL 1 MG/ML IJ SOLN: 0.5000 mg | INTRAMUSCULAR | Status: DC | PRN

## 2022-08-14 MED ORDER — ACETAMINOPHEN 160 MG/5ML PO SOLN: 1000.0000 mg | Freq: Once | ORAL | Status: DC | PRN

## 2022-08-14 MED ORDER — EPHEDRINE SULFATE-NACL 50-0.9 MG/10ML-% IV SOSY
PREFILLED_SYRINGE | INTRAVENOUS | Status: DC | PRN
  Administered 2022-08-14: 5 mg via INTRAVENOUS

## 2022-08-14 SURGICAL SUPPLY — 71 items
ADH SKN CLS APL DERMABOND .7 (GAUZE/BANDAGES/DRESSINGS) ×2
APL PRP STRL LF DISP 70% ISPRP (MISCELLANEOUS) ×2
BAG COUNTER SPONGE SURGICOUNT (BAG) IMPLANT
BAG LAPAROSCOPIC 12 15 PORT 16 (BASKET) ×2 IMPLANT
BAG RETRIEVAL 12/15 (BASKET) ×4
BAG SPNG CNTER NS LX DISP (BAG)
CANNULA REDUC XI 12-8 STAPL (CANNULA) ×2
CANNULA REDUCER 12-8 DVNC XI (CANNULA) IMPLANT
CHLORAPREP W/TINT 26 (MISCELLANEOUS) ×2 IMPLANT
CLIP LIGATING HEM O LOK PURPLE (MISCELLANEOUS) ×2 IMPLANT
CLIP LIGATING HEMO LOK XL GOLD (MISCELLANEOUS) ×2 IMPLANT
CLIP LIGATING HEMO O LOK GREEN (MISCELLANEOUS) IMPLANT
COVER SURGICAL LIGHT HANDLE (MISCELLANEOUS) ×2 IMPLANT
COVER TIP SHEARS 8 DVNC (MISCELLANEOUS) ×2 IMPLANT
COVER TIP SHEARS 8MM DA VINCI (MISCELLANEOUS) ×2
CUTTER ECHEON FLEX ENDO 45 340 (ENDOMECHANICALS) IMPLANT
DERMABOND ADVANCED .7 DNX12 (GAUZE/BANDAGES/DRESSINGS) ×2 IMPLANT
DRAPE ARM DVNC X/XI (DISPOSABLE) ×8 IMPLANT
DRAPE COLUMN DVNC XI (DISPOSABLE) ×2 IMPLANT
DRAPE DA VINCI XI ARM (DISPOSABLE) ×8
DRAPE DA VINCI XI COLUMN (DISPOSABLE) ×2
DRAPE INCISE IOBAN 66X45 STRL (DRAPES) ×2 IMPLANT
DRAPE SHEET LG 3/4 BI-LAMINATE (DRAPES) ×2 IMPLANT
ELECT PENCIL ROCKER SW 15FT (MISCELLANEOUS) ×2 IMPLANT
ELECT REM PT RETURN 15FT ADLT (MISCELLANEOUS) ×2 IMPLANT
GAUZE 4X4 16PLY ~~LOC~~+RFID DBL (SPONGE) IMPLANT
GLOVE BIO SURGEON STRL SZ 6.5 (GLOVE) ×2 IMPLANT
GLOVE BIOGEL PI IND STRL 8 (GLOVE) ×2 IMPLANT
GLOVE SURG LX STRL 7.5 STRW (GLOVE) ×4 IMPLANT
GOWN SRG XL LVL 4 BRTHBL STRL (GOWNS) ×2 IMPLANT
GOWN STRL NON-REIN XL LVL4 (GOWNS) ×2
GOWN STRL REUS W/ TWL XL LVL3 (GOWN DISPOSABLE) ×4 IMPLANT
GOWN STRL REUS W/TWL XL LVL3 (GOWN DISPOSABLE) ×4
HANDLE SUCTION POOLE (INSTRUMENTS) IMPLANT
HOLDER FOLEY CATH W/STRAP (MISCELLANEOUS) ×2 IMPLANT
IRRIG SUCT STRYKERFLOW 2 WTIP (MISCELLANEOUS) ×2
IRRIGATION SUCT STRKRFLW 2 WTP (MISCELLANEOUS) ×2 IMPLANT
KIT BASIN OR (CUSTOM PROCEDURE TRAY) ×2 IMPLANT
KIT TURNOVER KIT A (KITS) IMPLANT
MARKER SKIN DUAL TIP RULER LAB (MISCELLANEOUS) ×2 IMPLANT
NDL INSUFFLATION 14GA 120MM (NEEDLE) ×2 IMPLANT
NEEDLE INSUFFLATION 14GA 120MM (NEEDLE) ×2 IMPLANT
PROTECTOR NERVE ULNAR (MISCELLANEOUS) ×4 IMPLANT
RELOAD STAPLE 45 2.6 WHT THIN (STAPLE) IMPLANT
RELOAD STAPLE 60 3.5 BLU DVNC (STAPLE) IMPLANT
RELOAD STAPLER 3.5X60 BLU DVNC (STAPLE) ×2 IMPLANT
SCISSORS LAP 5X45 EPIX DISP (ENDOMECHANICALS) IMPLANT
SEAL CANN UNIV 5-8 DVNC XI (MISCELLANEOUS) ×8 IMPLANT
SEAL XI 5MM-8MM UNIVERSAL (MISCELLANEOUS) ×10
SET TUBE SMOKE EVAC HIGH FLOW (TUBING) ×2 IMPLANT
SOLUTION ELECTROLUBE (MISCELLANEOUS) ×2 IMPLANT
SPIKE FLUID TRANSFER (MISCELLANEOUS) ×2 IMPLANT
STAPLE RELOAD 45 WHT (STAPLE) ×2 IMPLANT
STAPLE RELOAD 45MM WHITE (STAPLE) ×2
STAPLER 60 DA VINCI SURE FORM (STAPLE) ×2
STAPLER 60 SUREFORM DVNC (STAPLE) IMPLANT
STAPLER RELOAD 3.5X60 BLU DVNC (STAPLE) ×2
STAPLER RELOAD 3.5X60 BLUE (STAPLE) ×2
SUCTION POOLE HANDLE (INSTRUMENTS) ×2
SUT MNCRL AB 4-0 PS2 18 (SUTURE) ×4 IMPLANT
SUT PDS AB 0 CT1 36 (SUTURE) ×4 IMPLANT
SUT VIC AB 0 CT1 27 (SUTURE) ×2
SUT VIC AB 0 CT1 27XBRD ANTBC (SUTURE) ×2 IMPLANT
SUT VIC AB 2-0 SH 27 (SUTURE) ×2
SUT VIC AB 2-0 SH 27X BRD (SUTURE) ×2 IMPLANT
TOWEL OR 17X26 10 PK STRL BLUE (TOWEL DISPOSABLE) ×2 IMPLANT
TRAY LAPAROSCOPIC (CUSTOM PROCEDURE TRAY) ×2 IMPLANT
TROCAR Z THREAD OPTICAL 12X100 (TROCAR) ×2 IMPLANT
TROCAR Z-THREAD OPTICAL 5X100M (TROCAR) IMPLANT
TUBING CONNECTING 10 (TUBING) IMPLANT
WATER STERILE IRR 1000ML POUR (IV SOLUTION) ×2 IMPLANT

## 2022-08-14 NOTE — Anesthesia Procedure Notes (Signed)
Procedure Name: Intubation Date/Time: 08/14/2022 1:00 PM  Performed by: Jenne Campus, CRNAPre-anesthesia Checklist: Patient identified, Emergency Drugs available, Suction available and Patient being monitored Patient Re-evaluated:Patient Re-evaluated prior to induction Oxygen Delivery Method: Circle System Utilized Preoxygenation: Pre-oxygenation with 100% oxygen Induction Type: IV induction Ventilation: Mask ventilation without difficulty and Oral airway inserted - appropriate to patient size Laryngoscope Size: Sabra Heck and 3 Grade View: Grade I Tube type: Oral Tube size: 7.0 mm Number of attempts: 1 Airway Equipment and Method: Stylet and Oral airway Placement Confirmation: ETT inserted through vocal cords under direct vision, positive ETCO2 and breath sounds checked- equal and bilateral Secured at: 22 cm Tube secured with: Tape Dental Injury: Teeth and Oropharynx as per pre-operative assessment

## 2022-08-14 NOTE — Op Note (Signed)
Luverta Korte [id number]   PRE-OPERATIVE DIAGNOSIS:  dilated appendix  POST-OPERATIVE DIAGNOSIS:  Appendiceal mass   Procedure(s): XI ROBOTIC LAPAROSCOPIC ASSISTED APPENDECTOMY XI ROBOTIC ASSISTED LAPAROSCOPIC NEPHRECTOMY   Surgeon(s): Leighton Ruff, MD Ceasar Mons, MD  ASSISTANT: none   ANESTHESIA:   local and general  EBL:   61m  Delay start of Pharmacological VTE agent (>24hrs) due to surgical blood loss or risk of bleeding:  no  DRAINS: none   SPECIMEN:  Source of Specimen:  appendix  DISPOSITION OF SPECIMEN:  PATHOLOGY  COUNTS:  YES  PLAN OF CARE: Discharge to home after PACU  PATIENT DISPOSITION:  PACU - hemodynamically stable.   INDICATIONS: Patient with concerning symptoms & work up suspicious for appendicitis.  Surgery was recommended:  The anatomy & physiology of the digestive tract was discussed.  The pathophysiology of appendicitis was discussed.  Natural history risks without surgery was discussed.   I feel the risks of no intervention will lead to serious problems that outweigh the operative risks; therefore, I recommended diagnostic laparoscopy with removal of appendix to remove the pathology.  Laparoscopic & open techniques were discussed.   I noted a good likelihood this will help address the problem.    Risks such as bleeding, infection, abscess, leak, reoperation, possible ostomy, hernia, heart attack, death, and other risks were discussed.  Goals of post-operative recovery were discussed as well.  We will work to minimize complications.  Questions were answered.  The patient expresses understanding & wishes to proceed with surgery.  OR FINDINGS: 77y.o. F with renal mass and appendiceal mass noted on imaging done for workup of kidney mass  DESCRIPTION:   The patient was already in the operating room under anesthesia from her nephrectomy. The patient was positioned supine with left arm tucked. SCDs were active during the entire case.  The abdomen was prepped and draped in a sterile fashion. A Surgical Timeout confirmed our plan.  I mobilized the terminal ileum to proximal ascending colon in a lateral to medial fashion.  I took care to avoid injuring any retroperitoneal structures.   The mass was located in the mid appendix.  I freed the appendix off its attachments to the ascending colon and cecal mesentery.  I elevated the appendix.  I was able to free off the base of the appendix.  I stapled the appendix off the cecum using a robotic blue load stapler.  I took a healthy cuff viable cecum. I skeletonized & ligated the mesoappendix using cautery and bipolar energy.   I placed the appendix inside an EndoCatch bag and removed out the midline incision.  The kidney specimen was removed as well by Dr WLovena Neighbours   I did copious irrigation. Hemostasis was good in the mesoappendix, colon mesentery, and retroperitoneum. Staple line was intact on the cecum with no bleeding. I washed out the pelvis, retrohepatic space and right paracolic gutter.  Hemostasis is good. There was no perforation or injury.    I aspirated the carbon dioxide. We removed the ports. We closed the umbilical fascia site using 2 running #1 PDS sutures. We closed skin using 4-0 vicryl stitch.  Sterile dressings were applied.  Patient was extubated and sent to the recovery room.  I discussed the operative findings with the patient's family.  Questions answered. They expressed understanding and appreciation.   ARosario Adie MD  [facility]

## 2022-08-14 NOTE — Transfer of Care (Signed)
Immediate Anesthesia Transfer of Care Note  Patient: [name] [name]  Procedure(s) Performed: XI ROBOTIC LAPAROSCOPIC ASSISTED APPENDECTOMY XI ROBOTIC ASSISTED LAPAROSCOPIC NEPHRECTOMY (Left)  Patient Location: PACU  Anesthesia Type:General  Level of Consciousness: oriented, sedated, and patient cooperative  Airway & Oxygen Therapy: Patient Spontanous Breathing and Patient connected to face mask oxygen  Post-op Assessment: Report given to RN and Post -op Vital signs reviewed and stable  Post vital signs: Reviewed  Last Vitals:  Vitals Value Taken Time  BP 143/60 08/14/22 1615  Temp    Pulse 65 08/14/22 1622  Resp 10 08/14/22 1622  SpO2 100 % 08/14/22 1622  Vitals shown include unvalidated device data.  Last Pain:  Vitals:   08/14/22 1026  PainSc: 0-No pain         Complications: No notable events documented.

## 2022-08-14 NOTE — Op Note (Signed)
Operative Note  Preoperative diagnosis:  1.  6.5 cm left renal mass 2.  Appendiceal mass  Postoperative diagnosis: Same  Procedure(s): 1.  Robot-assisted laparoscopic left radical nephrectomy  Surgeon: Ellison Hughs, MD  Assistants: 1.  Debbrah Alar, PA-C  An assistant was required for this surgical procedure.  The duties of the assistant included but were not limited to suctioning, passing suture, camera manipulation, retraction.  This procedure would not be able to be performed without an Environmental consultant.   Anesthesia:  General  Complications:  None  EBL: 50 mL  Specimens: 1.  Left kidney and adrenal gland  Drains/Catheters: 1.  Foley catheter  Intraoperative findings:   The left renal hilum was hemostatic following staple ligation The lateral attachments of the left kidney were densely adherent to the abdominal/thoracic sidewall  Indication:  [name] [name] is a 77 y.o. female with a solid and enhancing 6.5 cm left renal mass with features concerning for renal cell carcinoma.  She also has an appendiceal tumor that is going to require laparoscopic appendectomy with Dr. Marcello Moores.  She has been consented for the above procedures, voices understanding and wishes to proceed.  Description of procedure:  After informed consent was obtained, the patient was brought to the operating room and general endotracheal anesthesia was administered.  The patient was then placed in the right lateral decubitus position and prepped and draped in usual sterile fashion.  A timeout was performed.  An 8 mm incision was then made lateral to the left rectus muscle at the level of the left 12th rib.  Abdominal access was obtained via a Veress needle.  The abdominal cavity was then insufflated up to 15 mmHg.  An 8 mm port was then introduced into the abdominal cavity.  Inspection of the port entry site by the robotic camera revealed no adjacent organ injury.  We then placed 3 additional 8 mm robotic ports to  triangulate the left renal hilum.  A 12 mm assistant port was then placed between the carmera port and 3rd robotic arm.  The white line of Toldt along the descending colon was incised sharply and the colon, along with its mesocolonic fat, was reflected medially until the aorta was identified.  We then made a small window adjacent to the lower pole of the left kidney, identifying the left psoas muscle, left ureter and left gonadal vein.  The left ureter and gonadal vein were then reflected anteriorly allowing [place] to then incised the perihilar attachments using electrocautery.  We encountered a small lumbar vein adjacent to the insertion of the left gonadal vein into the left renal vein.  This lumbar vein was ligated with hemo-lock clips in 2 places and incised sharply.  This provided [place] excellent exposure to the left renal hilum.    A 45 mm endovascular stapler was then used to ligate the left renal artery and then the left renal vein, achieving excellent hemostasis.  The remaining peri-renal attachments were then excised using a combination of blunt dissection and electrocautery.  The left adrenal gland was not spared.  Hemoclips were then used to ligate the left gonadal vein and left ureter.  Once the kidney was freely mobile, it was placed in Endo Catch bag to be be retrieved at the conclusion of the case.  The robot was then de-docked.  [place]oban tape was then placed over the incisions to keep them sterile.  The patient was then placed back into the supine position and Dr. Marcello Moores then proceeded with her portion  of the case.  Plan: Monitor on the floor overnight

## 2022-08-14 NOTE — H&P (Signed)
REFERRING PHYSICIAN:  Lincoln Brigham, [place]   PROVIDER:  Monico Blitz, MD   MRN: [id number] DOB: 03/22/1946 DATE OF ENCOUNTER: 06/18/2022   Subjective    Chief Complaint: New Consultation ( eval for possible appendiceal cancer )       History of Present Illness: [name] [name] is a 77 y.o. female who is seen today as an office consultation at the request of Dr. Charlies Silvers for evaluation of New Consultation ( eval for possible appendiceal cancer ) .  Patient underwent a recent CT scan for evaluation of renal mass seen on MRI.  She was noted to have a left kidney lesion concerning for malignancy as well as a dilated appendix concerning for possible malignancy or mucocele.    Review of Systems: A complete review of systems was obtained from the patient.  I have reviewed this information and discussed as appropriate with the patient.  See HPI as well for other ROS.       Medical History: Past Medical History         Past Medical History:  Diagnosis Date   Anxiety     Arrhythmia     Arthritis     Asthma, unspecified asthma severity, unspecified whether complicated, unspecified whether persistent     CHF (congestive heart failure) (CMS-HCC)     Diabetes mellitus without complication (CMS-HCC)     End-stage liver disease (CMS-HCC)     Esophageal varices (CMS-HCC)     GERD (gastroesophageal reflux disease)     History of cancer     Hyperlipidemia     Sleep apnea          There is no problem list on file for this patient.     Past Surgical History           Past Surgical History:  Procedure Laterality Date   CHOLECYSTECTOMY       HYSTERECTOMY       JOINT REPLACEMENT       left kidney stone       SHOULDER ARTHROSCOPY DISTAL CLAVICLE EXCISION AND OPEN ROTATOR CUFF REPAIR            Allergies           Allergies  Allergen Reactions   Doxycycline Hives   Epinephrine Hcl Other (See Comments)      REACTION: Had tremors when it was given.   Metformin Other (See  Comments)   Oxycodone Other (See Comments)      "felt like I was dying"                   Current Outpatient Medications on File Prior to Visit  Medication Sig Dispense Refill   atenoloL (TENORMIN) 25 MG tablet TAKE 1/2 TO 1 TABLET BY MOUTH 2 (TWO) TIMES DAILY.       azaTHIOprine (IMURAN) 50 mg tablet TAKE 2 TABLETS BY MOUTH EVERY DAY FOR 90 DAYS       HUMALOG U-100 INSULIN injection (concentration 100 units/mL) INJECT 25 UNITS SUBCUTANEOUSLY THREE TIMES DAILY       insulin NPH (HUMULIN N) injection (concentration 100 units/mL) 40u       pantoprazole (PROTONIX) 40 MG DR tablet 1 TAB(S) ORALLY 20 MINUTES BEFORE BREAKFAST 90 DAYS       cetirizine (ZYRTEC) 10 MG tablet Take 10 mg by mouth at bedtime       fluticasone propionate (FLONASE) 50 mcg/actuation nasal spray Place into one nostril       FUROsemide (  LASIX) 40 MG tablet Take 40 mg by mouth 2 (two) times daily       magnesium oxide-Mg AA chelate (MAGNESIUM, OXIDE/AA CHELATE,) 300 mg Cap 1 capsule with a meal        No current facility-administered medications on file prior to visit.      Family History           Family History  Problem Relation Age of Onset   Skin cancer Mother     Obesity Mother     Deep vein thrombosis (DVT or abnormal blood clot formation) Mother     Skin cancer Father     Obesity Father     Hyperlipidemia (Elevated cholesterol) Father     Coronary Artery Disease (Blocked arteries around heart) Father     Skin cancer Sister     Obesity Sister     Hyperlipidemia (Elevated cholesterol) Sister     Skin cancer Brother     Obesity Brother     High blood pressure (Hypertension) Brother     Hyperlipidemia (Elevated cholesterol) Brother     Coronary Artery Disease (Blocked arteries around heart) Brother          Social History         Tobacco Use  Smoking Status Never  Smokeless Tobacco Never      Social History  Social History           Socioeconomic History   Marital status: Unknown  Tobacco  Use   Smoking status: Never   Smokeless tobacco: Never  Substance and Sexual Activity   Alcohol use: Not Currently   Drug use: Never        Objective:            Exam Gen: NAD Abd: soft       Labs, Imaging and Diagnostic Testing: CT reviewed.  Most recent colonoscopy report reviewed as well.  Repeat colonoscopy on 12/2 negative at appendiceal orifice per Dr Benson Norway   Assessment and Plan:  Diagnoses and all orders for this visit:   Mass of appendix     Patient with a mid appendiceal mass noted on most recent CT scan.  Colonoscopy showed no cecal masses.  Will proceed with appendectomy during the patient's nephrectomy surgery.  Risks include bleeding, infection and damage to adjacent structures.     No follow-ups on file.   Rosario Adie, MD Colon and Rectal Surgery [facility]

## 2022-08-14 NOTE — Discharge Instructions (Signed)

## 2022-08-14 NOTE — H&P (Signed)
Office Visit Report     08/02/2022   --------------------------------------------------------------------------------   [name] [name]  MRN: [id number]  DOB: [birth year], 77 year old Female  PRIMARY CARE:  Jinny Sanders, MD  PRIMARY CARE FAX:  [phone]  REFERRING:  Harrell Gave A. Lovena Neighbours, MD  PROVIDER:  Ellison Hughs, M.D.  TREATING:  Daine Gravel, NP  LOCATION:  [facility] [phone]     --------------------------------------------------------------------------------   CC/HPI: Renal mass   Ms. Mastrogiovanni is a 89 female with a solid and enhancing renal mass measuring 6.5 cm involving the LEFT kidney. Apparently the mass was initially discovered on MRI of the spine during an evaluation for ongoing back pain and further characterized via CT of the abdomen/pelvis with and without contrast on 05/29/2022. She was also noted to have an abnormality involving her appendix.   -Anatomy: Endophytic, 6.5 cm solid enhancing left renal mass with features concerning for renal cell carcinoma. No abnormalities are seen involving the right kidney.  -Personal/family history of GU malignancies:  -Smoking history:  -Prior abdominal surgeries: Lap chole in the 1990s, left nephrolithotomy in the 1980s.  -Renal function: No recent BMP available  -History of kidney stones: as above  -Ambulates with a cane due to bilateral hip arthritis (requires periodic prednisone injections)  -CT chests from 06/17/22 and 08/05/22 show multiple pulmonary nodules concerning for metastatic disease  PCP: Eliezer Lofts, MD  Cardiologist: Quay Burow, MD   08/02/2022: 77 year old female who presents today for preoperative appointment prior to undergoing a left-sided nephrectomy. She will also undergo a appendectomy with general surgery. She reports no changes to her medications or health history.     ALLERGIES: Contrast Dye EPINEPHrine HCl SOLN Erythromycin TABS Morphine Derivatives Percocet TABS     MEDICATIONS: Atenolol 25 mg tablet  Azathioprine 50 mg tablet  Co Q-10 100 mg capsule  Culturelle  Furosemide 40 mg tablet  Magnesium  Nerivio  Pantoprazole Sodium 40 mg tablet, delayed release  Prednisone 10 mg tablet  Turmeric  Zinc     GU PSH: Hysterectomy Unilat SO - 2009       PSH Notes: Liver Biopsy, Cholecystectomy, Rotator Cuff Repair, Bladder Surgery, Hysterectomy, left open nephrolithotomy    NON-GU PSH: Cholecystectomy (open) - 2009 Needle Biopsy Liver - 2009     GU PMH: Left renal neoplasm - 06/06/2022 Chronic cystitis (w/o hematuria), Chronic cystitis - 2014 Cystocele, midline, Cystocele, midline - 2014 History of urolithiasis, Nephrolithiasis - 2014 Overactive bladder, Overactive bladder - 2014 Stress Incontinence, Female stress incontinence - 2014 Urge incontinence, Urge incontinence of urine - 2014      PMH Notes: cirrhosis, diverticulitis, T2 diabetes, sleep apnea and polyneuropathy   NON-GU PMH: Disease of appendix, unspecified - 06/06/2022 Arrhythmia, Rhythm Disorder - 2014 Diverticulosis, Diverticulosis - 2014 Irritable bowel syndrome with diarrhea, Irritable Bowel Syndrome - 2014 Personal history of other diseases of the digestive system, History of esophageal reflux - 2014 Personal history of other diseases of the nervous system and sense organs, History of sleep apnea - 2014, History of peripheral neuropathy, - 2014 Personal history of other endocrine, nutritional and metabolic disease, History of diabetes mellitus - 2014, History of hypercholesterolemia, - 2014    FAMILY HISTORY: 2 daughters - Daughter 2 sons - Son Blood In Urine - Father Cirrhosis - Father Death In The Family Father - Runs In Family Family Health Status Number - Runs In Family nephrolithiasis - Father, Brother   SOCIAL HISTORY: Marital Status: Widowed Preferred Language: English; Ethnicity: Not  Hispanic Or Latino; Race: White Current Smoking Status: Patient has never  smoked.   Tobacco Use Assessment Completed: Used Tobacco in last 30 days? Has never drank.  Drinks 3 caffeinated drinks per day.     Notes: Occupation:, Alcohol Use, Caffeine Use, Tobacco Use   REVIEW OF SYSTEMS:    GU Review Female:   Patient denies frequent urination, burning /pain with urination, get up at night to urinate, leakage of urine, stream starts and stops, trouble starting your stream, have to strain to urinate, and being pregnant.  Gastrointestinal (Upper):   Patient denies nausea, vomiting, and indigestion/ heartburn.  Gastrointestinal (Lower):   Patient denies diarrhea and constipation.  Constitutional:   Patient denies fever, night sweats, weight loss, and fatigue.  Skin:   Patient denies skin rash/ lesion and itching.  Eyes:   Patient denies blurred vision and double vision.  Ears/ Nose/ Throat:   Patient denies sore throat and sinus problems.  Hematologic/Lymphatic:   Patient denies swollen glands and easy bruising.  Cardiovascular:   Patient denies leg swelling and chest pains.  Respiratory:   Patient denies cough and shortness of breath.  Endocrine:   Patient denies excessive thirst.  Musculoskeletal:   Patient reports joint pain. Patient denies back pain.  Neurological:   Patient denies headaches and dizziness.  Psychologic:   Patient denies depression and anxiety.   VITAL SIGNS:      08/02/2022 03:54 PM  BP 171/63 mmHg  Heart Rate 57 /min  Temperature 98.2 F / 36.7 C   MULTI-SYSTEM PHYSICAL EXAMINATION:    Constitutional: Well-nourished. No physical deformities. Normally developed. Good grooming.  Neck: Neck symmetrical, not swollen. Normal tracheal position.  Respiratory: No labored breathing, no use of accessory muscles.   Cardiovascular: Normal temperature, normal extremity pulses, no swelling, no varicosities.  Lymphatic: No enlargement of neck, axillae, groin.  Skin: No paleness, no jaundice, no cyanosis. No lesion, no ulcer, no rash.  Neurologic /  Psychiatric: Oriented to time, oriented to place, oriented to person. No depression, no anxiety, no agitation.  Gastrointestinal: No mass, no tenderness, no rigidity, non obese abdomen.  Eyes: Normal conjunctivae. Normal eyelids.  Ears, Nose, Mouth, and Throat: Left ear no scars, no lesions, no masses. Right ear no scars, no lesions, no masses. Nose no scars, no lesions, no masses. Normal hearing. Normal lips.  Musculoskeletal: Normal gait and station of head and neck.     Complexity of Data:  Source Of History:  Patient  Records Review:   Previous Doctor Records, Previous Patient Records  Urine Test Review:   Urinalysis   08/02/22  Urinalysis  Urine Appearance Clear   Urine Color Yellow   Urine Glucose Neg mg/dL  Urine Bilirubin Neg mg/dL  Urine Ketones Neg mg/dL  Urine Specific Gravity 1.015   Urine Blood Neg ery/uL  Urine pH <=5.0   Urine Protein Neg mg/dL  Urine Urobilinogen 0.2 mg/dL  Urine Nitrites Neg   Urine Leukocyte Esterase 1+ leu/uL  Urine WBC/hpf 0 - 5/hpf   Urine RBC/hpf NS (Not Seen)   Urine Epithelial Cells NS (Not Seen)   Urine Bacteria Few (10-25/hpf)   Urine Mucous Not Present   Urine Yeast NS (Not Seen)   Urine Trichomonas Not Present   Urine Cystals NS (Not Seen)   Urine Casts NS (Not Seen)   Urine Sperm Not Present    PROCEDURES:          Urinalysis w/Scope Dipstick Dipstick Cont'd Micro  Color: Yellow Bilirubin: Neg  mg/dL WBC/hpf: 0 - 5/hpf  Appearance: Clear Ketones: Neg mg/dL RBC/hpf: NS (Not Seen)  Specific Gravity: 1.015 Blood: Neg ery/uL Bacteria: Few (10-25/hpf)  pH: <=5.0 Protein: Neg mg/dL Cystals: NS (Not Seen)  Glucose: Neg mg/dL Urobilinogen: 0.2 mg/dL Casts: NS (Not Seen)    Nitrites: Neg Trichomonas: Not Present    Leukocyte Esterase: 1+ leu/uL Mucous: Not Present      Epithelial Cells: NS (Not Seen)      Yeast: NS (Not Seen)      Sperm: Not Present    ASSESSMENT:      ICD-10 Details  1 GU:   Left renal neoplasm - D49.512  Chronic, Stable   PLAN:           Orders Labs CULTURE, URINE          Document Letter(s):  Created for Patient: Clinical Summary         Notes:   Urine will be sent for precautionary culture today. She will keep her upcoming scheduled surgery on 08/14/2022 with Dr. Lovena Neighbours for a left nephrectomy and appendectomy with general surgery. All of her questions were answered to the best of my ability.    -I reviewed imaging results and films with the patient personally. We discussed that the mass in question has features concerning for malignancy. I explained the natural history of presumed renal cell carcinoma. I reviewed the AUA guidelines for evaluation and treatment of renal masses. The options of active surveillance, in situ tumor ablation, partial and radical nephrectomy was discussed. The risks of robot-assisted laparoscopic LEFT radical nephrectomy were discussed in detail including but not limited to: negative pathology, open conversion, infection of the skin/abdominal cavity, VTE, MI/CVA, lymphatic leak, injury to adjacent solid/hollow viscus organs, bleeding requiring a blood transfusion, catastrophic bleeding, hernia formation and other imponderables. The patient voices understanding and wishes to proceed.   She will also be having a sameday lap appendectomy with Dr. Marcello Moores.

## 2022-08-15 ENCOUNTER — Encounter (HOSPITAL_COMMUNITY): Payer: Self-pay | Admitting: General Surgery

## 2022-08-15 LAB — BASIC METABOLIC PANEL
Anion gap: 8 (ref 5–15)
BUN: 15 mg/dL (ref 8–23)
CO2: 26 mmol/L (ref 22–32)
Calcium: 8.3 mg/dL — ABNORMAL LOW (ref 8.9–10.3)
Chloride: 102 mmol/L (ref 98–111)
Creatinine, Ser: 1 mg/dL (ref 0.44–1.00)
GFR, Estimated: 58 mL/min — ABNORMAL LOW (ref >60)
Glucose, Bld: 270 mg/dL — ABNORMAL HIGH (ref 70–99)
Potassium: 4.8 mmol/L (ref 3.5–5.1)
Sodium: 136 mmol/L (ref 135–145)

## 2022-08-15 LAB — GLUCOSE, CAPILLARY
Glucose-Capillary: 237 mg/dL — ABNORMAL HIGH (ref 70–99)
Glucose-Capillary: 283 mg/dL — ABNORMAL HIGH (ref 70–99)
Glucose-Capillary: 289 mg/dL — ABNORMAL HIGH (ref 70–99)
Glucose-Capillary: 227 mg/dL — ABNORMAL HIGH (ref 70–99)

## 2022-08-15 LAB — HEMOGLOBIN AND HEMATOCRIT, BLOOD
HCT: 33.8 % — ABNORMAL LOW (ref 36.0–46.0)
HCT: 37.2 % (ref 36.0–46.0)
Hemoglobin: 10.9 g/dL — ABNORMAL LOW (ref 12.0–15.0)
Hemoglobin: 12.6 g/dL (ref 12.0–15.0)

## 2022-08-15 MED ORDER — INSULIN DETEMIR 100 UNIT/ML ~~LOC~~ SOLN
20.0000 [IU] | Freq: Every day | SUBCUTANEOUS | Status: DC
  Administered 2022-08-16: 20 [IU] via SUBCUTANEOUS
  Filled 2022-08-15 (×2): qty 0.2

## 2022-08-15 NOTE — Progress Notes (Signed)
1 Day Post-Op  Subjective: Doing well, tolerating clears  Objective: Vital signs in last 24 hours: Temp:  [97.1 F (36.2 C)-98.8 F (37.1 C)] 97.8 F (36.6 C) (01/25 0827) Pulse Rate:  [53-71] 53 (01/25 0827) Resp:  [10-17] 17 (01/25 0827) BP: (107-184)/(48-85) 184/55 (01/25 0827) SpO2:  [92 %-100 %] 98 % (01/25 0827)   Intake/Output from previous day: 01/24 0701 - 01/25 0700 In: 2799.2 [P.O.:480; I.V.:2119.2; IV Piggyback:200] Out: 1200 [Urine:1150; Blood:50] Intake/Output this shift: Total I/O In: -  Out: 350 [Urine:350]   General appearance: alert and cooperative  Incision: no significant drainage  Lab Results:  Recent Labs    08/14/22 1631 08/15/22 0436  HGB 11.7* 10.9*  HCT 37.2 33.8*   BMET Recent Labs    08/15/22 0436  NA 136  K 4.8  CL 102  CO2 26  GLUCOSE 270*  BUN 15  CREATININE 1.00  CALCIUM 8.3*   PT/INR No results for input(s): "LABPROT", "INR" in the last 72 hours. ABG No results for input(s): "PHART", "HCO3" in the last 72 hours.  Invalid input(s): "PCO2", "PO2"  MEDS, Scheduled  atenolol  25 mg Oral BID   azaTHIOprine  50 mg Oral BID   docusate sodium  100 mg Oral BID   insulin aspart  0-20 Units Subcutaneous TID WC   pantoprazole  40 mg Oral Daily   predniSONE  10 mg Oral Q breakfast    Studies/Results: No results found.  Assessment: s/p Procedure(s): XI ROBOTIC LAPAROSCOPIC ASSISTED APPENDECTOMY XI ROBOTIC ASSISTED LAPAROSCOPIC NEPHRECTOMY Patient Active Problem List   Diagnosis Date Noted   Renal mass 08/14/2022   Aortic atherosclerosis ([place]) 06/04/2022   Renal mass, left 05/27/2022   Swelling of limb 04/12/2022   Osteopenia 09/21/2021   History of nephrolithiasis 09/21/2021   Sensation of pressure in bladder area 09/21/2021   Anemia 08/17/2019   Hypertension 08/17/2019   Anxiety 08/17/2019   Lower GI bleeding 08/17/2019   Acute lower GI bleeding 08/16/2019   Autoimmune hepatitis ([place]) 12/29/2018   Chest pain  12/22/2018   Asthmatic bronchitis with exacerbation 03/21/2017   Elevated LFTs 12/24/2016   SVT (supraventricular tachycardia) 12/24/2016   Peripheral edema 12/24/2016   S/P foot surgery, right 07/27/2015   Preoperative clearance 06/29/2015   Angioedema 08/30/2014   Recurrent UTI 01/06/2014   Chronic insomnia 01/06/2014   Acute bilateral low back pain without sciatica 03/07/2011   Vitamin D deficiency 08/20/2010   TINNITUS, CHRONIC, 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  Advance diet as tolerated.  Ok to d/c when able.  Will f/u with the pt regarding pathology when available    LOS: 1 day     Rosario Adie, MD [facility], [place]    08/15/2022 10:59 AM

## 2022-08-15 NOTE — Anesthesia Postprocedure Evaluation (Signed)
Anesthesia Post Note  Patient: [name] [name]  Procedure(s) Performed: XI ROBOTIC LAPAROSCOPIC ASSISTED APPENDECTOMY XI ROBOTIC ASSISTED LAPAROSCOPIC NEPHRECTOMY (Left)     Patient location during evaluation: PACU Anesthesia Type: General Level of consciousness: awake and alert Pain management: pain level controlled Vital Signs Assessment: post-procedure vital signs reviewed and stable Respiratory status: spontaneous breathing, nonlabored ventilation and respiratory function stable Cardiovascular status: blood pressure returned to baseline and stable Postop Assessment: no apparent nausea or vomiting Anesthetic complications: no   No notable events documented.  Last Vitals:  Vitals:   08/14/22 2022 08/15/22 0007  BP: (!) 142/85 (!) 122/48  Pulse: 66 63  Resp: 17 16  Temp: 36.8 C 37.1 C  SpO2: 98% 96%    Last Pain:  Vitals:   08/15/22 0007  TempSrc: Oral  PainSc:                  [name]

## 2022-08-15 NOTE — Progress Notes (Signed)
1 Day Post-Op Subjective: Patient reports mild RLQ discomfort.  She has not taken any pain meds today.  Denies N/V. Passing some flatus. Has had clears. Using IS.  Only walked once in the room this morning. Nervous about going home.   Objective: Vital signs in last 24 hours: Temp:  [97.1 F (36.2 C)-98.8 F (37.1 C)] 97.8 F (36.6 C) (01/25 0827) Pulse Rate:  [53-71] 57 (01/25 1139) Resp:  [10-17] 17 (01/25 0827) BP: (107-184)/(48-85) 140/55 (01/25 1139) SpO2:  [92 %-100 %] 98 % (01/25 1139)  Intake/Output from previous day: 01/24 0701 - 01/25 0700 In: 2799.2 [P.O.:480; I.V.:2119.2; IV Piggyback:200] Out: 1200 [Urine:1150; Blood:50] Intake/Output this shift: Total I/O In: 170.8 [I.V.:170.8] Out: 350 [Urine:350]  Physical Exam:  General:alert, cooperative, and no distress Cardiovascular: RRR Lungs: CTA GI: soft, NT, ND, faint BS Incisions: C/D/I Urine:Clear  Extremities: SCDs in place   Lab Results: Recent Labs    08/14/22 1631 08/15/22 0436 08/15/22 1055  HGB 11.7* 10.9* 12.6  HCT 37.2 33.8* 37.2   BMET Recent Labs    08/15/22 0436  NA 136  K 4.8  CL 102  CO2 26  GLUCOSE 270*  BUN 15  CREATININE 1.00  CALCIUM 8.3*   No results for input(s): "LABPT", "INR" in the last 72 hours. No results for input(s): "LABURIN" in the last 72 hours. Results for orders placed or performed in visit on 09/21/21  Urine Culture     Status: Abnormal   Collection Time: 09/21/21  4:20 PM   Specimen: Urine  Result Value Ref Range Status   MICRO NUMBER: [id number]  Final   SPECIMEN QUALITY: Adequate  Final   Sample Source NOT GIVEN  Final   STATUS: FINAL  Final   ISOLATE 1: Escherichia coli (A)  Final    Comment: Greater than 100,000 CFU/mL of Escherichia coli      Susceptibility   Escherichia coli - URINE CULTURE, REFLEX    AMOX/CLAVULANIC <=2 Sensitive     AMPICILLIN 4 Sensitive     AMPICILLIN/SULBACTAM <=2 Sensitive     CEFAZOLIN* <=4 Not Reportable      * For  infections other than uncomplicated UTI caused by E. coli, K. pneumoniae or P. mirabilis: Cefazolin is resistant if MIC > or = 8 mcg/mL. (Distinguishing susceptible versus intermediate for isolates with MIC < or = 4 mcg/mL requires additional testing.) For uncomplicated UTI caused by E. coli, K. pneumoniae or P. mirabilis: Cefazolin is susceptible if MIC <32 mcg/mL and predicts susceptible to the oral agents cefaclor, cefdinir, cefpodoxime, cefprozil, cefuroxime, cephalexin and loracarbef.     CEFTAZIDIME <=1 Sensitive     CEFEPIME <=1 Sensitive     CEFTRIAXONE <=1 Sensitive     CIPROFLOXACIN <=0.25 Sensitive     LEVOFLOXACIN <=0.12 Sensitive     GENTAMICIN <=1 Sensitive     IMIPENEM <=0.25 Sensitive     NITROFURANTOIN <=16 Sensitive     PIP/TAZO <=4 Sensitive     TOBRAMYCIN <=1 Sensitive     TRIMETH/SULFA* <=20 Sensitive      * For infections other than uncomplicated UTI caused by E. coli, K. pneumoniae or P. mirabilis: Cefazolin is resistant if MIC > or = 8 mcg/mL. (Distinguishing susceptible versus intermediate for isolates with MIC < or = 4 mcg/mL requires additional testing.) For uncomplicated UTI caused by E. coli, K. pneumoniae or P. mirabilis: Cefazolin is susceptible if MIC <32 mcg/mL and predicts susceptible to the oral agents cefaclor, cefdinir, cefpodoxime, cefprozil, cefuroxime, cephalexin and loracarbef.  Legend: S = Susceptible  I = Intermediate R = Resistant  NS = Not susceptible * = Not tested  NR = Not reported **NN = See antimicrobic comments     Studies/Results: No results found.  Assessment/Plan: 1 Day Post-Op, Procedure(s) (LRB): XI ROBOTIC LAPAROSCOPIC ASSISTED APPENDECTOMY (N/A) XI ROBOTIC ASSISTED LAPAROSCOPIC NEPHRECTOMY (Left)  Doing well but nervous about going home. Mild discomfort RLQ Ambulate, Incentive spirometry DVT prophylaxis Transition to PO pain medications SL IVF Advance diet D/C foley with TOV Give Ultram now for  pain Plan for d/c home tomorrow morning    LOS: 1 day   [name] [name] 08/15/2022, 12:49 PM

## 2022-08-15 NOTE — Inpatient Diabetes Management (Signed)
Inpatient Diabetes Program Recommendations  AACE/ADA: New Consensus Statement on Inpatient Glycemic Control (2015)  Target Ranges:  Prepandial:   less than 140 mg/dL      Peak postprandial:   less than 180 mg/dL (1-2 hours)      Critically ill patients:  140 - 180 mg/dL   Lab Results  Component Value Date   GLUCAP 237 (H) 08/15/2022   HGBA1C 7.9 (H) 08/05/2022    Review of Glycemic Control  Latest Reference Range & Units 08/14/22 20:29 08/15/22 07:32  Glucose-Capillary 70 - 99 mg/dL 211 (H) 237 (H)  (H): Data is abnormally high Diabetes history: Type 2 Dm Outpatient Diabetes medications: NPH 15 units BID, Humalog 25 units TID Current orders for Inpatient glycemic control: Novolog 0-20 units TID Prednisone 10 mg QD Decadron 4 mg x 1  Inpatient Diabetes Program Recommendations:    Consider adding Levemir 12 units BID if to remain inpatient.  Thanks, Bronson Curb, MSN, RNC-OB Diabetes Coordinator [phone] (8a-5p)

## 2022-08-15 NOTE — Progress Notes (Signed)
  Transition of Care [facility]) Screening Note   Patient Details  Name: [name] [name] Date of Birth: 11/09/1945   Transition of [facility]) CM/SW Contact:    Dessa Phi, RN Phone Number: 08/15/2022, 1:36 PM    Transition of Care Department [facility]) has reviewed patient and no TOC needs have been identified at this time. We will continue to monitor patient advancement through interdisciplinary progression rounds. If new patient transition needs arise, please place a TOC consult.

## 2022-08-16 LAB — GLUCOSE, CAPILLARY: Glucose-Capillary: 247 mg/dL — ABNORMAL HIGH (ref 70–99)

## 2022-08-16 NOTE — Discharge Summary (Signed)
Date of admission: 08/14/2022  Date of discharge: 08/16/2022  Admission diagnosis: Left renal mass, dilated appendix  Discharge diagnosis: Left renal mass, appendiceal mass  Secondary diagnoses: nephrolithiasis, hip arthritis, pulmonary nodules, cystocele, SUI  History and Physical: For full details, please see admission history and physical. Briefly, [name] [name] is a 77 y.o. year old patient with a solid and enhancing renal mass measuring 6.5 cm involving the LEFT kidney. Apparently the mass was initially discovered on MRI of the spine during an evaluation for ongoing back pain and further characterized via CT of the abdomen/pelvis with and without contrast on 05/29/2022. She was also noted to have an abnormality involving her appendix.   Hospital Course: Pt was admitted and taken to the OR on 06/15/23 for a RAL left radical nephrectomy and appendectomy.  Pt tolerated the procedure well and woke up from anesthesia neurologically intact. She remained hemodynamically stable throughout the procedure.  Pt was transferred from the OR to PACU and then to the floor without issue.  Post op course progressed as expected.  Foley was removed on POD 1 and she was able to void.  She was passing flatus and tolerating a regular diet on POD 1 as well.  Pts ambulation was limited by her hip and back issues.  She had mild RLQ pain the afternoon of POD 1 and felt more comfortable staying overnight for observation.  She was given pain medication and this resolved.  On the morning of POD 2 she was felt stable for d/c home.  All labs and vitals remained stable throughout the post op course.   Laboratory values:  Recent Labs    08/14/22 1631 08/15/22 0436 08/15/22 1055  HGB 11.7* 10.9* 12.6  HCT 37.2 33.8* 37.2   Recent Labs    08/15/22 0436  CREATININE 1.00    Disposition: Home  Discharge instruction: The patient was instructed to be ambulatory but told to refrain from heavy lifting, strenuous activity, or  driving.   Discharge medications:  Allergies as of 08/16/2022       Reactions   Doxycycline Hives   Epinephrine Hcl Other (See Comments)   Had tremors when it was given.   Erythromycin Ethylsuccinate Other (See Comments)   REACTION: abd spasms   Iohexol Other (See Comments)    Code: HIVES, Desc: pt needs premedicated-- hives on prev contrast study per md office   Metformin Diarrhea   Oxycodone Other (See Comments)   "felt like I was dying"   Tape Rash        Medication List     TAKE these medications    albuterol 108 (90 Base) MCG/ACT inhaler Commonly known as: ProAir HFA Inhale 1-2 puffs into the lungs every 6 (six) hours as needed for wheezing or shortness of breath.   atenolol 25 MG tablet Commonly known as: TENORMIN TAKE 1/2 TO 1 TABLET BY MOUTH 2 (TWO) TIMES DAILY. What changed: See the new instructions.   azaTHIOprine 50 MG tablet Commonly known as: IMURAN Take 50 mg by mouth 2 (two) times daily.   cetirizine 10 MG tablet Commonly known as: ZYRTEC Take 10 mg by mouth at bedtime.   Coenzyme Q10 100 MG Tabs Take 100 mg by mouth daily.   CULTURELLE PO Take 1 capsule by mouth daily.   FreeStyle [place] 2 Reader Kerrin Mo See admin instructions.   FreeStyle [employer] 2 Sensor Misc See admin instructions.   furosemide 40 MG tablet Commonly known as: LASIX Take 40 mg by mouth daily.  glucose blood test strip OneTouch Ultra Blue Test Strip  TEST 3 TIMES DAILY E11.65   halobetasol 0.05 % cream Commonly known as: ULTRAVATE Apply 1 Application topically 2 (two) times daily as needed (itching).   insulin lispro 100 UNIT/ML injection Commonly known as: HUMALOG Inject into the skin See admin instructions. Via Insulin Pump  NOT currently using Insulin Pump due to a malfunction per pt report on 08/12/22.   insulin lispro 100 UNIT/ML injection Commonly known as: HUMALOG Inject 25 Units into the skin 3 (three) times daily before meals. 25 units at breakfast, lunch  and supper   insulin NPH Human 100 UNIT/ML injection Commonly known as: NOVOLIN N Inject 15 Units into the skin daily. 15 units every am   MAGNESIUM PO Take 150 mg by mouth 2 (two) times daily.   mometasone 50 MCG/ACT nasal spray Commonly known as: NASONEX Place 2 sprays into the nose daily.   pantoprazole 40 MG tablet Commonly known as: PROTONIX TAKE 1 TABLET BY MOUTH EVERY DAY   predniSONE 10 MG tablet Commonly known as: DELTASONE Take 10 mg by mouth daily with breakfast.   predniSONE 50 MG tablet Commonly known as: DELTASONE Take 1 tablet 13 hours, 7 hours and 1 hour prior to CT scan injection   QC TUMERIC COMPLEX PO Take 500 mg by mouth daily.   Systane Hydration PF 0.4-0.3 % Soln Generic drug: Polyethyl Glyc-Propyl Glyc PF Place 1 drop into both eyes as needed (dry eyes).   traMADol 50 MG tablet Commonly known as: Ultram Take 1-2 tablets (50-100 mg total) by mouth every 6 (six) hours as needed for moderate pain or severe pain.   UNABLE TO FIND Take 1 tablet by mouth daily. Med Name: Heide Scales Vitamin   zinc gluconate 50 MG tablet Take 25 mg by mouth in the morning and at bedtime.        Followup:   Follow-up Information     Ceasar Mons, MD Follow up.   Specialty: Urology Why: office will call you with date and time Contact information: [address] [place] [address] [phone]         Leighton Ruff, MD. Schedule an appointment as soon as possible for a visit in 2 week(s).   Specialties: General Surgery, Colon and Rectal Surgery Contact information: [place] [address] [phone]

## 2022-08-19 ENCOUNTER — Telehealth: Payer: Self-pay | Admitting: *Deleted

## 2022-08-19 NOTE — Patient Outreach (Signed)
  Care Coordination TOC Note Transition Care Management Follow-up Telephone Call Date of discharge and from where: [facility] [phone] How have you been since you were released from the hospital? So far good Any questions or concerns? No  Items Reviewed: Did the pt receive and understand the discharge instructions provided? Yes  Medications obtained and verified? Yes  Per patient only taking the pain medication at HS Other? No  Any new allergies since your discharge?  n Dietary orders reviewed? No Do you have support at home? Yes   Home Care and Equipment/Supplies: Were home health services ordered? not applicable If so, what is the name of the agency? N   Has the agency set up a time to come to the patient's home? not applicable Were any new equipment or medical supplies ordered?  No What is the name of the medical supply agency? n Were you able to get the supplies/equipment? not applicable Do you have any questions related to the use of the equipment or supplies? No  Functional Questionnaire: (I = Independent and D = Dependent) ADLs: D  Bathing/Dressing- I  Meal Prep- D  Eating- I  Maintaining continence- I  Transferring/Ambulation- I  Managing Meds- I  Follow up appointments reviewed:  PCP Hospital f/u appt confirmed? No   Specialist Hospital f/u appt confirmed? Yes  Scheduled to see Dr Marcello Moores [phone] 11:10 Are transportation arrangements needed? No  If their condition worsens, is the pt aware to call PCP or go to the Emergency Dept.? Yes Was the patient provided with contact information for the PCP's office or ED? Yes Was to pt encouraged to call back with questions or concerns? Yes  SDOH assessments and interventions completed:   Yes SDOH Interventions Today    Flowsheet Row Most Recent Value  SDOH Interventions   Food Insecurity Interventions Intervention Not Indicated  Housing Interventions Intervention Not Indicated  Transportation  Interventions Intervention Not Indicated       Care Coordination Interventions:  No Care Coordination interventions needed at this time.   Encounter Outcome:  Pt. Visit Completed    [facility] [phone]

## 2022-08-20 LAB — SURGICAL PATHOLOGY

## 2022-08-29 DIAGNOSIS — E1165 Type 2 diabetes mellitus with hyperglycemia: Secondary | ICD-10-CM | POA: Diagnosis not present

## 2022-09-09 DIAGNOSIS — N390 Urinary tract infection, site not specified: Secondary | ICD-10-CM | POA: Diagnosis not present

## 2022-09-09 DIAGNOSIS — C642 Malignant neoplasm of left kidney, except renal pelvis: Secondary | ICD-10-CM | POA: Diagnosis not present

## 2022-09-12 ENCOUNTER — Other Ambulatory Visit: Payer: Self-pay | Admitting: Hematology and Oncology

## 2022-09-12 ENCOUNTER — Inpatient Hospital Stay: Payer: PPO

## 2022-09-12 ENCOUNTER — Inpatient Hospital Stay: Payer: PPO | Attending: Physician Assistant | Admitting: Hematology and Oncology

## 2022-09-12 VITALS — BP 156/52 | HR 50 | Temp 97.6°F | Resp 16 | Wt 196.7 lb

## 2022-09-12 DIAGNOSIS — M25551 Pain in right hip: Secondary | ICD-10-CM | POA: Diagnosis not present

## 2022-09-12 DIAGNOSIS — C649 Malignant neoplasm of unspecified kidney, except renal pelvis: Secondary | ICD-10-CM | POA: Insufficient documentation

## 2022-09-12 DIAGNOSIS — N3090 Cystitis, unspecified without hematuria: Secondary | ICD-10-CM | POA: Insufficient documentation

## 2022-09-12 DIAGNOSIS — I509 Heart failure, unspecified: Secondary | ICD-10-CM | POA: Diagnosis not present

## 2022-09-12 DIAGNOSIS — Z90710 Acquired absence of both cervix and uterus: Secondary | ICD-10-CM | POA: Insufficient documentation

## 2022-09-12 DIAGNOSIS — E1142 Type 2 diabetes mellitus with diabetic polyneuropathy: Secondary | ICD-10-CM | POA: Diagnosis not present

## 2022-09-12 DIAGNOSIS — Z905 Acquired absence of kidney: Secondary | ICD-10-CM | POA: Insufficient documentation

## 2022-09-12 DIAGNOSIS — R918 Other nonspecific abnormal finding of lung field: Secondary | ICD-10-CM | POA: Insufficient documentation

## 2022-09-12 DIAGNOSIS — M25552 Pain in left hip: Secondary | ICD-10-CM | POA: Diagnosis not present

## 2022-09-12 DIAGNOSIS — C642 Malignant neoplasm of left kidney, except renal pelvis: Secondary | ICD-10-CM | POA: Diagnosis not present

## 2022-09-12 DIAGNOSIS — N2889 Other specified disorders of kidney and ureter: Secondary | ICD-10-CM

## 2022-09-12 LAB — CBC WITH DIFFERENTIAL (CANCER CENTER ONLY)
Abs Immature Granulocytes: 0.07 10*3/uL (ref 0.00–0.07)
Basophils Absolute: 0 10*3/uL (ref 0.0–0.1)
Basophils Relative: 0 %
Eosinophils Absolute: 0.1 10*3/uL (ref 0.0–0.5)
Eosinophils Relative: 1 %
HCT: 41.5 % (ref 36.0–46.0)
Hemoglobin: 14.1 g/dL (ref 12.0–15.0)
Immature Granulocytes: 1 %
Lymphocytes Relative: 13 %
Lymphs Abs: 1.3 10*3/uL (ref 0.7–4.0)
MCH: 30.9 pg (ref 26.0–34.0)
MCHC: 34 g/dL (ref 30.0–36.0)
MCV: 91 fL (ref 80.0–100.0)
Monocytes Absolute: 0.4 10*3/uL (ref 0.1–1.0)
Monocytes Relative: 4 %
Neutro Abs: 8.3 10*3/uL — ABNORMAL HIGH (ref 1.7–7.7)
Neutrophils Relative %: 81 %
Platelet Count: 143 10*3/uL — ABNORMAL LOW (ref 150–400)
RBC: 4.56 MIL/uL (ref 3.87–5.11)
RDW: 14.3 % (ref 11.5–15.5)
WBC Count: 10.3 10*3/uL (ref 4.0–10.5)
nRBC: 0 % (ref 0.0–0.2)

## 2022-09-12 LAB — CMP (CANCER CENTER ONLY)
ALT: 29 U/L (ref 0–44)
AST: 49 U/L — ABNORMAL HIGH (ref 15–41)
Albumin: 3.6 g/dL (ref 3.5–5.0)
Alkaline Phosphatase: 170 U/L — ABNORMAL HIGH (ref 38–126)
Anion gap: 7 (ref 5–15)
BUN: 30 mg/dL — ABNORMAL HIGH (ref 8–23)
CO2: 28 mmol/L (ref 22–32)
Calcium: 9.4 mg/dL (ref 8.9–10.3)
Chloride: 106 mmol/L (ref 98–111)
Creatinine: 1.23 mg/dL — ABNORMAL HIGH (ref 0.44–1.00)
GFR, Estimated: 46 mL/min — ABNORMAL LOW (ref >60)
Glucose, Bld: 186 mg/dL — ABNORMAL HIGH (ref 70–99)
Potassium: 3.8 mmol/L (ref 3.5–5.1)
Sodium: 141 mmol/L (ref 135–145)
Total Bilirubin: 1 mg/dL (ref 0.3–1.2)
Total Protein: 6.5 g/dL (ref 6.5–8.1)

## 2022-09-12 MED ORDER — TRAMADOL HCL 50 MG PO TABS: 50.0000 mg | ORAL_TABLET | Freq: Four times a day (QID) | ORAL | 0 refills | Status: DC | PRN

## 2022-09-12 NOTE — Progress Notes (Signed)
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 Lorenso Courier  08/05/2022: CT Chest WO showed numerous bilateral pulmonary nodules concerning for metastatic disease without interval change (from 06/17/2022 CT scan) 08/14/2022: left kidney resetion and appendectomy. Pathology confirms Clear Cell RCC  Interval History:  [name] [name] 77 y.o. female with medical history significant clear-cell carcinoma of the kidney status post resection who presents for a follow up visit.  In the interim since the last visit she has undergone resection of the appendix and left kidney, findings consistent with clear-cell carcinoma.  On exam today [name] [name] is accompanied by her 2 daughters.  She reports that she has been struggling with a bladder infection for which she is taking Keflex.  She is currently on her second day of this therapy.  She is also having bursitis of the hips and is in "a lot of pain".  She reports that after the pain medications were after the surgery she has been quite sore.  She notes that she is not able to take Tylenol or ibuprofen due to her liver and kidney disease.  She reports that she has not had any issues with the surgical site such as drainage, erythema, or pain.  She notes that she is not having any fevers, chills, sweats, nausea, vomiting or diarrhea.  Otherwise she is at her baseline level of health.  She denies any lightheadedness, dizziness, or shortness of breath.  A full 10 point ROS was otherwise negative.  The bulk of our discussion focused on the treatment plan involving the bilateral pulmonary nodules as well as the clinical course for clear-cell  carcinoma of the kidney.  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   Complication of anesthesia    " i WAKE UP WITH TREMORS " after shoulder surgery tremors for 10 months after surgery surgery- 2009- surgical center in [place]   Cyst and pseudocyst of pancreas    Depressive disorder, not elsewhere classified    Diabetes mellitus without complication ([place])    Diverticulitis    Diverticulosis of colon (without mention of hemorrhage)    Dyspnea    Dysrhythmia    arrhythmia   Esophageal reflux    Essential and other specified forms of tremor    History of GI bleed    History of kidney stones    Irritable bowel syndrome    Obesity, unspecified    Other specified cardiac dysrhythmias(427.89)    Periapical abscess without sinus    Polyneuropathy in diabetes(357.2)    PONV (postoperative nausea and vomiting)    Pure hypercholesterolemia    Sleep apnea    CPAP- non compliant   Type II or unspecified type diabetes mellitus with neurological manifestations, not stated as uncontrolled(250.60)     SURGICAL HISTORY: Past Surgical History:  Procedure Laterality Date   ABDOMINAL HYSTERECTOMY     CALCANEAL OSTEOTOMY Right  07/27/2015   Procedure: RIGHT CALCANEAL OSTEOTOMY ;  Surgeon: Wylene Simmer, MD;  Location: [facility];  Service: Orthopedics;  Laterality: Right;   CHOLECYSTECTOMY     15-20 years ago   COLONOSCOPY WITH PROPOFOL N/A 08/17/2019   Procedure: COLONOSCOPY WITH PROPOFOL;  Surgeon: Carol Ada, MD;  Location: [facility];  Service: Endoscopy;  Laterality: N/A;   COLONOSCOPY WITH PROPOFOL N/A 06/21/2022   Procedure: COLONOSCOPY WITH PROPOFOL;  Surgeon: Carol Ada, MD;  Location: [facility];  Service: Gastroenterology;   Laterality: N/A;   ESOPHAGOGASTRODUODENOSCOPY (EGD) WITH PROPOFOL N/A 08/17/2019   Procedure: ESOPHAGOGASTRODUODENOSCOPY (EGD) WITH PROPOFOL;  Surgeon: Carol Ada, MD;  Location: [facility];  Service: Endoscopy;  Laterality: N/A;   GASTROCNEMIUS RECESSION Right 07/27/2015   Procedure: RIGHT GASTROC RECESSION;  Surgeon: Wylene Simmer, MD;  Location: [facility];  Service: Orthopedics;  Laterality: Right;   KIDNEY STONE SURGERY  1980's   removal   LITHOTRIPSY     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   PARTIAL HYSTERECTOMY  1990's   POLYPECTOMY  08/17/2019   Procedure: POLYPECTOMY;  Surgeon: Carol Ada, MD;  Location: [facility];  Service: Endoscopy;;   POLYPECTOMY  06/21/2022   Procedure: POLYPECTOMY;  Surgeon: Carol Ada, MD;  Location: [facility];  Service: Gastroenterology;;   ROBOT ASSISTED LAPAROSCOPIC NEPHRECTOMY Left 08/14/2022   Procedure: XI ROBOTIC ASSISTED LAPAROSCOPIC NEPHRECTOMY;  Surgeon: Ceasar Mons, MD;  Location: [facility];  Service: Urology;  Laterality: Left;   ROTATOR CUFF REPAIR  2005   XI ROBOTIC LAPAROSCOPIC ASSISTED APPENDECTOMY N/A 08/14/2022   Procedure: XI ROBOTIC LAPAROSCOPIC ASSISTED APPENDECTOMY;  Surgeon: Leighton Ruff, MD;  Location: [facility];  Service: General;  Laterality: N/A;    SOCIAL HISTORY: Social History   Socioeconomic History   Marital status: Widowed    Spouse name: Not on file   Number of children: 5   Years of education: Not on file   Highest education level: Not on file  Occupational History    Employer: RETIRED  Tobacco Use   Smoking status: Never   Smokeless tobacco: Never  Vaping Use   Vaping Use: Never used  Substance and Sexual Activity   Alcohol use: No    Alcohol/week: 0.0 standard drinks of alcohol   Drug use: No   Sexual activity: Not on file  Other Topics Concern   Not on file  Social History Narrative   Husband treated for testicular cancer, 2 cups coffee a day, no exercise   Social Determinants of  Health   Financial Resource Strain: Low Risk  (01/14/2022)   Overall Financial Resource Strain (CARDIA)    Difficulty of Paying Living Expenses: Not very hard  Food Insecurity: No Food Insecurity (08/19/2022)   Hunger Vital Sign    Worried About Running Out of Food in the Last Year: Never true    Ran Out of Food in the Last Year: Never true  Transportation Needs: No Transportation Needs (08/19/2022)   PRAPARE - Hydrologist (Medical): No    Lack of Transportation (Non-Medical): No  Physical Activity: Inactive (01/14/2022)   Exercise Vital Sign    Days of Exercise per Week: 0 days    Minutes of Exercise per Session: 0 min  Stress: No Stress Concern Present (01/14/2022)   [place]    Feeling of Stress : Only a little  Social Connections: Not on file  Intimate Partner Violence: Not on file  FAMILY HISTORY: Family History  Problem Relation Age of Onset   Clotting disorder Mother    Cirrhosis Father    Diabetes Other        2 aunts   Cancer Other        grandmother   Allergies Other        "whole family"   Asthma Other        "whole family"    ALLERGIES:  is allergic to doxycycline, epinephrine hcl, erythromycin ethylsuccinate, iohexol, metformin, oxycodone, and tape.  MEDICATIONS:  Current Outpatient Medications  Medication Sig Dispense Refill   albuterol (PROAIR HFA) 108 (90 Base) MCG/ACT inhaler Inhale 1-2 puffs into the lungs every 6 (six) hours as needed for wheezing or shortness of breath. 1 each 2   atenolol (TENORMIN) 25 MG tablet TAKE 1/2 TO 1 TABLET BY MOUTH 2 (TWO) TIMES DAILY. (Patient taking differently: Take 25 mg by mouth 2 (two) times daily.) 180 tablet 0   azaTHIOprine (IMURAN) 50 MG tablet Take 50 mg by mouth 2 (two) times daily.     cetirizine (ZYRTEC) 10 MG tablet Take 10 mg by mouth at bedtime.     Coenzyme Q10 100 MG TABS Take 100 mg by mouth daily.      furosemide (LASIX) 40 MG tablet Take 40 mg by mouth daily.     glucose blood test strip OneTouch Ultra Blue Test Strip  TEST 3 TIMES DAILY E11.65     halobetasol (ULTRAVATE) 0.05 % cream Apply 1 Application topically 2 (two) times daily as needed (itching).     insulin lispro (HUMALOG) 100 UNIT/ML injection Inject into the skin See admin instructions. Via Insulin Pump  NOT currently using Insulin Pump due to a malfunction per pt report on 08/12/22.     insulin lispro (HUMALOG) 100 UNIT/ML injection Inject 25 Units into the skin 3 (three) times daily before meals. 25 units at breakfast, lunch and supper     Lactobacillus Rhamnosus, GG, (CULTURELLE PO) Take 1 capsule by mouth daily.     MAGNESIUM PO Take 150 mg by mouth 2 (two) times daily.     mometasone (NASONEX) 50 MCG/ACT nasal spray Place 2 sprays into the nose daily.     pantoprazole (PROTONIX) 40 MG tablet TAKE 1 TABLET BY MOUTH EVERY DAY 30 tablet 0   Polyethyl Glyc-Propyl Glyc PF (SYSTANE HYDRATION PF) 0.4-0.3 % SOLN Place 1 drop into both eyes as needed (dry eyes).     predniSONE (DELTASONE) 10 MG tablet Take 10 mg by mouth daily with breakfast.     traMADol (ULTRAM) 50 MG tablet Take 1 tablet (50 mg total) by mouth every 6 (six) hours as needed. 30 tablet 0   UNABLE TO FIND Take 1 tablet by mouth daily. Med Name: Heide Scales Vitamin     zinc gluconate 50 MG tablet Take 25 mg by mouth in the morning and at bedtime.     predniSONE (DELTASONE) 50 MG tablet Take 1 tablet 13 hours, 7 hours and 1 hour prior to CT scan injection 3 tablet 0   Turmeric (QC TUMERIC COMPLEX PO) Take 500 mg by mouth daily. (Patient not taking: Reported on 08/08/2022)     No current facility-administered medications for this visit.    REVIEW OF SYSTEMS:   Constitutional: ( - ) fevers, ( - )  chills , ( - ) night sweats Eyes: ( - ) blurriness of vision, ( - ) double vision, ( - ) watery eyes Ears, nose, mouth, throat,  and face: ( - ) mucositis, ( - ) sore  throat Respiratory: ( - ) cough, ( - ) dyspnea, ( - ) wheezes Cardiovascular: ( - ) palpitation, ( - ) chest discomfort, ( - ) lower extremity swelling Gastrointestinal:  ( - ) nausea, ( - ) heartburn, ( - ) change in bowel habits Skin: ( - ) abnormal skin rashes Lymphatics: ( - ) new lymphadenopathy, ( - ) easy bruising Neurological: ( - ) numbness, ( - ) tingling, ( - ) new weaknesses Behavioral/Psych: ( - ) mood change, ( - ) new changes  All other systems were reviewed with the patient and are negative.  PHYSICAL EXAMINATION:  Vitals:   09/12/22 1419  BP: (!) 156/52  Pulse: (!) 50  Resp: 16  Temp: 97.6 F (36.4 C)  SpO2: 98%   Filed Weights   09/12/22 1419  Weight: 196 lb 11.2 oz (89.2 kg)    GENERAL: Well-appearing elderly Caucasian female, alert, no distress and comfortable SKIN: skin color, texture, turgor are normal, no rashes or significant lesions EYES: conjunctiva are pink and non-injected, sclera clear LUNGS: clear to auscultation and percussion with normal breathing effort HEART: regular rate & rhythm and no murmurs and no lower extremity edema Musculoskeletal: no cyanosis of digits and no clubbing  PSYCH: alert & oriented x 3, fluent speech NEURO: no focal motor/sensory deficits  LABORATORY DATA:  I have reviewed the data as listed    Latest Ref Rng & Units 09/12/2022    1:41 PM 08/15/2022   10:55 AM 08/15/2022    4:36 AM  CBC  WBC 4.0 - 10.5 K/uL 10.3     Hemoglobin 12.0 - 15.0 g/dL 14.1  12.6  10.9   Hematocrit 36.0 - 46.0 % 41.5  37.2  33.8   Platelets 150 - 400 K/uL 143          Latest Ref Rng & Units 09/12/2022    1:41 PM 08/15/2022    4:36 AM 08/05/2022    1:40 PM  CMP  Glucose 70 - 99 mg/dL 186  270  368   BUN 8 - 23 mg/dL 30  15  19   $ Creatinine 0.44 - 1.00 mg/dL 1.23  1.00  0.89   Sodium 135 - 145 mmol/L 141  136  138   Potassium 3.5 - 5.1 mmol/L 3.8  4.8  4.3   Chloride 98 - 111 mmol/L 106  102  102   CO2 22 - 32 mmol/L 28  26  25    $ Calcium 8.9 - 10.3 mg/dL 9.4  8.3  9.5   Total Protein 6.5 - 8.1 g/dL 6.5     Total Bilirubin 0.3 - 1.2 mg/dL 1.0     Alkaline Phos 38 - 126 U/L 170     AST 15 - 41 U/L 49     ALT 0 - 44 U/L 29      RADIOGRAPHIC STUDIES: I have personally reviewed the radiological images as listed and agreed with the findings in the report: Lung nodules, most prominently in the left lower lung. No results found.  ASSESSMENT & PLAN [name] [name] 77 y.o. female with medical history significant for a left renal mass, dilated appendix who presents for a follow up visit.   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  # Pulmonary Nodules  --CT abdomen/pelvis from 05/29/2022 showed findings concerning for renal cell carcinoma.  --underwent robotic assisted laparoscopic appendectomy and nephrectomy joint procedure on 08/14/2022. --Pathology findings are  consistent for a benign appendix but clear-cell RCC of left kidney. Plan:  --Etiology of the small lung nodules is unclear, would recommend PET CT scan in order to evaluate these further.  Can consider biopsy for definitive diagnosis if PET avid --Labs today show white blood cell count 10.3, hemoglobin 14.1, MCV 91, and platelets of 143.  Creatinine is 1.23 --Follow-up visit placeholder in 3 months time.   Orders Placed This Encounter  Procedures   NM PET Image Initial (PI) Skull Base To Thigh    Standing Status:   Future    Standing Expiration Date:   09/12/2023    Order Specific Question:   If indicated for the ordered procedure, I authorize the administration of a radiopharmaceutical per Radiology protocol    Answer:   Yes    Order Specific Question:   Preferred imaging location?    Answer:   [facility]    All questions were answered. The patient knows to call the clinic with any problems, questions or concerns.  A total of more than 30 minutes were spent on this encounter with face-to-face time and non-face-to-face time, including preparing to see  the patient, ordering tests and/or medications, counseling the patient and coordination of care as outlined above.   Ledell Peoples, MD Department of Hematology/Oncology [facility] Phone: [phone] Pager: [phone] Email: [email or web address]  09/12/2022 5:18 PM

## 2022-09-13 ENCOUNTER — Telehealth: Payer: Self-pay | Admitting: Hematology and Oncology

## 2022-09-13 NOTE — Telephone Encounter (Signed)
Called patient's daughter per 2/22 los notes to schedule f/u. Patient scheduled and will be notified.

## 2022-09-18 DIAGNOSIS — E1165 Type 2 diabetes mellitus with hyperglycemia: Secondary | ICD-10-CM | POA: Diagnosis not present

## 2022-09-23 ENCOUNTER — Telehealth: Payer: Self-pay

## 2022-09-23 NOTE — Telephone Encounter (Signed)
Daughter called, patient scheduled for PET scan on 09/26/22 at [facility]. Patient is claustrophobic. Daughter is requesting valium for PET scan procedure. Per daughter, patient cannot tolerate Ativan. Patient has been medicated in the past for PET scans.  Routing to [employer] PA

## 2022-09-25 ENCOUNTER — Other Ambulatory Visit: Payer: Self-pay | Admitting: Physician Assistant

## 2022-09-25 MED ORDER — DIAZEPAM 2 MG PO TABS: 2.0000 mg | ORAL_TABLET | Freq: Once | ORAL | 0 refills | Status: AC

## 2022-09-25 MED ORDER — LORAZEPAM 0.5 MG PO TABS: 0.5000 mg | ORAL_TABLET | Freq: Once | ORAL | 0 refills | Status: DC

## 2022-09-26 ENCOUNTER — Ambulatory Visit
Admission: RE | Admit: 2022-09-26 | Discharge: 2022-09-26 | Disposition: A | Discharge status: Home / Self Care | Payer: PPO | Source: Ambulatory Visit | Attending: Hematology and Oncology | Admitting: Hematology and Oncology

## 2022-09-26 DIAGNOSIS — Z905 Acquired absence of kidney: Secondary | ICD-10-CM | POA: Diagnosis not present

## 2022-09-26 DIAGNOSIS — C649 Malignant neoplasm of unspecified kidney, except renal pelvis: Secondary | ICD-10-CM | POA: Diagnosis not present

## 2022-09-26 DIAGNOSIS — K754 Autoimmune hepatitis: Secondary | ICD-10-CM | POA: Diagnosis not present

## 2022-09-26 DIAGNOSIS — J439 Emphysema, unspecified: Secondary | ICD-10-CM | POA: Diagnosis not present

## 2022-09-26 DIAGNOSIS — I700 Atherosclerosis of aorta: Secondary | ICD-10-CM | POA: Insufficient documentation

## 2022-09-26 DIAGNOSIS — R918 Other nonspecific abnormal finding of lung field: Secondary | ICD-10-CM | POA: Insufficient documentation

## 2022-09-26 DIAGNOSIS — E119 Type 2 diabetes mellitus without complications: Secondary | ICD-10-CM | POA: Diagnosis not present

## 2022-09-26 DIAGNOSIS — R911 Solitary pulmonary nodule: Secondary | ICD-10-CM | POA: Diagnosis not present

## 2022-09-26 LAB — GLUCOSE, CAPILLARY: Glucose-Capillary: 169 mg/dL — ABNORMAL HIGH (ref 70–99)

## 2022-09-26 MED ORDER — FLUDEOXYGLUCOSE F - 18 (FDG) INJECTION
10.2000 | Freq: Once | INTRAVENOUS | Status: AC | PRN
  Administered 2022-09-26: 10.77 via INTRAVENOUS

## 2022-09-27 DIAGNOSIS — E1165 Type 2 diabetes mellitus with hyperglycemia: Secondary | ICD-10-CM | POA: Diagnosis not present

## 2022-10-01 ENCOUNTER — Telehealth: Payer: Self-pay | Admitting: *Deleted

## 2022-10-01 DIAGNOSIS — R918 Other nonspecific abnormal finding of lung field: Secondary | ICD-10-CM

## 2022-10-01 NOTE — Telephone Encounter (Signed)
Daughter [name] [name] called requesting a phone call about the results of the PET scan.  Their follow up appointment isn't until May.  Routing to Dr Lorenso Courier and Dede Query, PA.

## 2022-10-01 NOTE — Telephone Encounter (Signed)
I called patient's daughter, Darrick Penna, to review the Pet scan results. I requested reading radiologist to compare PET to previous CT to confirm if lung nodules have changed in size.   Dr. Roby Lofts lung nodules are subcentimeter, do you want to repeat CT chest in 3 months?

## 2022-10-02 ENCOUNTER — Encounter: Payer: Self-pay | Admitting: Physician Assistant

## 2022-10-02 NOTE — Addendum Note (Signed)
Addended by: Dede Query T on: 10/02/2022 04:19 PM   Modules accepted: Orders

## 2022-10-02 NOTE — Telephone Encounter (Signed)
Patient's daughter, Darrick Penna, is aware of recommendations for surveillance with repeat CT scan in 3 months.   [place]: Can you move the labs and follow up scheduled in May to the week of June 17th.   Helen: Can you help me schedule the CT chest the week prior?

## 2022-10-07 DIAGNOSIS — C642 Malignant neoplasm of left kidney, except renal pelvis: Secondary | ICD-10-CM | POA: Diagnosis not present

## 2022-10-18 DIAGNOSIS — E1165 Type 2 diabetes mellitus with hyperglycemia: Secondary | ICD-10-CM | POA: Diagnosis not present

## 2022-10-21 DIAGNOSIS — D49519 Neoplasm of unspecified behavior of unspecified kidney: Secondary | ICD-10-CM | POA: Diagnosis not present

## 2022-10-21 DIAGNOSIS — M47816 Spondylosis without myelopathy or radiculopathy, lumbar region: Secondary | ICD-10-CM | POA: Diagnosis not present

## 2022-10-21 DIAGNOSIS — M7061 Trochanteric bursitis, right hip: Secondary | ICD-10-CM | POA: Diagnosis not present

## 2022-10-21 DIAGNOSIS — M48062 Spinal stenosis, lumbar region with neurogenic claudication: Secondary | ICD-10-CM | POA: Diagnosis not present

## 2022-10-28 DIAGNOSIS — E1165 Type 2 diabetes mellitus with hyperglycemia: Secondary | ICD-10-CM | POA: Diagnosis not present

## 2022-11-07 DIAGNOSIS — M5416 Radiculopathy, lumbar region: Secondary | ICD-10-CM | POA: Diagnosis not present

## 2022-11-21 DIAGNOSIS — M7061 Trochanteric bursitis, right hip: Secondary | ICD-10-CM | POA: Diagnosis not present

## 2022-11-21 DIAGNOSIS — M47896 Other spondylosis, lumbar region: Secondary | ICD-10-CM | POA: Diagnosis not present

## 2022-11-21 DIAGNOSIS — M48062 Spinal stenosis, lumbar region with neurogenic claudication: Secondary | ICD-10-CM | POA: Diagnosis not present

## 2022-11-27 DIAGNOSIS — E1165 Type 2 diabetes mellitus with hyperglycemia: Secondary | ICD-10-CM | POA: Diagnosis not present

## 2022-12-03 DIAGNOSIS — M5136 Other intervertebral disc degeneration, lumbar region: Secondary | ICD-10-CM | POA: Diagnosis not present

## 2022-12-03 DIAGNOSIS — Z6834 Body mass index (BMI) 34.0-34.9, adult: Secondary | ICD-10-CM | POA: Diagnosis not present

## 2022-12-09 DIAGNOSIS — E1165 Type 2 diabetes mellitus with hyperglycemia: Secondary | ICD-10-CM | POA: Diagnosis not present

## 2022-12-11 ENCOUNTER — Other Ambulatory Visit: Payer: PPO

## 2022-12-11 ENCOUNTER — Ambulatory Visit: Payer: PPO | Admitting: Hematology and Oncology

## 2022-12-24 DIAGNOSIS — Z9641 Presence of insulin pump (external) (internal): Secondary | ICD-10-CM | POA: Diagnosis not present

## 2022-12-24 DIAGNOSIS — M8580 Other specified disorders of bone density and structure, unspecified site: Secondary | ICD-10-CM | POA: Diagnosis not present

## 2022-12-24 DIAGNOSIS — E1065 Type 1 diabetes mellitus with hyperglycemia: Secondary | ICD-10-CM | POA: Diagnosis not present

## 2022-12-24 DIAGNOSIS — E7800 Pure hypercholesterolemia, unspecified: Secondary | ICD-10-CM | POA: Diagnosis not present

## 2022-12-24 DIAGNOSIS — I10 Essential (primary) hypertension: Secondary | ICD-10-CM | POA: Diagnosis not present

## 2022-12-24 DIAGNOSIS — K754 Autoimmune hepatitis: Secondary | ICD-10-CM | POA: Diagnosis not present

## 2022-12-24 DIAGNOSIS — F419 Anxiety disorder, unspecified: Secondary | ICD-10-CM | POA: Diagnosis not present

## 2022-12-28 DIAGNOSIS — E1165 Type 2 diabetes mellitus with hyperglycemia: Secondary | ICD-10-CM | POA: Diagnosis not present

## 2022-12-30 ENCOUNTER — Ambulatory Visit (HOSPITAL_COMMUNITY)
Admission: RE | Admit: 2022-12-30 | Discharge: 2022-12-30 | Disposition: A | Discharge status: Home / Self Care | Payer: PPO | Source: Ambulatory Visit | Attending: Physician Assistant | Admitting: Physician Assistant

## 2022-12-30 DIAGNOSIS — R918 Other nonspecific abnormal finding of lung field: Secondary | ICD-10-CM | POA: Insufficient documentation

## 2023-01-06 ENCOUNTER — Other Ambulatory Visit: Payer: Self-pay

## 2023-01-06 ENCOUNTER — Inpatient Hospital Stay: Payer: PPO | Attending: Hematology and Oncology

## 2023-01-06 ENCOUNTER — Inpatient Hospital Stay: Payer: PPO | Admitting: Hematology and Oncology

## 2023-01-06 VITALS — BP 94/72 | HR 57 | Temp 98.1°F | Resp 16 | Wt 200.0 lb

## 2023-01-06 DIAGNOSIS — C642 Malignant neoplasm of left kidney, except renal pelvis: Secondary | ICD-10-CM

## 2023-01-06 DIAGNOSIS — Z905 Acquired absence of kidney: Secondary | ICD-10-CM | POA: Insufficient documentation

## 2023-01-06 DIAGNOSIS — R2681 Unsteadiness on feet: Secondary | ICD-10-CM | POA: Diagnosis not present

## 2023-01-06 DIAGNOSIS — Z9089 Acquired absence of other organs: Secondary | ICD-10-CM | POA: Diagnosis not present

## 2023-01-06 DIAGNOSIS — R918 Other nonspecific abnormal finding of lung field: Secondary | ICD-10-CM | POA: Diagnosis not present

## 2023-01-06 DIAGNOSIS — R42 Dizziness and giddiness: Secondary | ICD-10-CM | POA: Insufficient documentation

## 2023-01-06 DIAGNOSIS — N2889 Other specified disorders of kidney and ureter: Secondary | ICD-10-CM | POA: Diagnosis not present

## 2023-01-06 LAB — CMP (CANCER CENTER ONLY)
ALT: 23 U/L (ref 0–44)
AST: 26 U/L (ref 15–41)
Albumin: 3.2 g/dL — ABNORMAL LOW (ref 3.5–5.0)
Alkaline Phosphatase: 124 U/L (ref 38–126)
Anion gap: 6 (ref 5–15)
BUN: 34 mg/dL — ABNORMAL HIGH (ref 8–23)
CO2: 27 mmol/L (ref 22–32)
Calcium: 9.5 mg/dL (ref 8.9–10.3)
Chloride: 106 mmol/L (ref 98–111)
Creatinine: 1.33 mg/dL — ABNORMAL HIGH (ref 0.44–1.00)
GFR, Estimated: 41 mL/min — ABNORMAL LOW (ref >60)
Glucose, Bld: 198 mg/dL — ABNORMAL HIGH (ref 70–99)
Potassium: 4.5 mmol/L (ref 3.5–5.1)
Sodium: 139 mmol/L (ref 135–145)
Total Bilirubin: 0.9 mg/dL (ref 0.3–1.2)
Total Protein: 5.6 g/dL — ABNORMAL LOW (ref 6.5–8.1)

## 2023-01-06 LAB — CBC WITH DIFFERENTIAL (CANCER CENTER ONLY)
Abs Immature Granulocytes: 0.11 10*3/uL — ABNORMAL HIGH (ref 0.00–0.07)
Basophils Absolute: 0 10*3/uL (ref 0.0–0.1)
Basophils Relative: 1 %
Eosinophils Absolute: 0.1 10*3/uL (ref 0.0–0.5)
Eosinophils Relative: 1 %
HCT: 39.1 % (ref 36.0–46.0)
Hemoglobin: 13.5 g/dL (ref 12.0–15.0)
Immature Granulocytes: 2 %
Lymphocytes Relative: 12 %
Lymphs Abs: 0.8 10*3/uL (ref 0.7–4.0)
MCH: 32.8 pg (ref 26.0–34.0)
MCHC: 34.5 g/dL (ref 30.0–36.0)
MCV: 94.9 fL (ref 80.0–100.0)
Monocytes Absolute: 0.4 10*3/uL (ref 0.1–1.0)
Monocytes Relative: 5 %
Neutro Abs: 5.1 10*3/uL (ref 1.7–7.7)
Neutrophils Relative %: 79 %
Platelet Count: 125 10*3/uL — ABNORMAL LOW (ref 150–400)
RBC: 4.12 MIL/uL (ref 3.87–5.11)
RDW: 14.6 % (ref 11.5–15.5)
WBC Count: 6.5 10*3/uL (ref 4.0–10.5)
nRBC: 0 % (ref 0.0–0.2)

## 2023-01-06 NOTE — Progress Notes (Signed)
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 Leonides Schanz  08/05/2022: CT Chest WO showed numerous bilateral pulmonary nodules concerning for metastatic disease without interval change (from 06/17/2022 CT scan) 08/14/2022: left kidney resetion and appendectomy. Pathology confirms Clear Cell RCC  Interval History:  [name] [name] 77 y.o. female with medical history significant clear-cell carcinoma of the kidney status post resection who presents for a follow up visit.  In the interim since the last visit she had a repeat CT scan which showed a decrease in the size of her pulmonary nodules.   On exam today [name] [name] reports that she has been well in the interim since her last visit.  She reports she does get periodic swelling in her legs with responds well to her fluid pill.  She reports that he does have increased urination but does not have any burning, straining, or urgency.  She notes that her weight has been steady and tends to fluctuate within the same 6 to 7 pound range.  She is having some occasional episodes of dizziness and unsteadiness on her feet but no lightheadedness or shortness of breath.  She reports that she does have tramadol prescribed does not take it often.  She notes that she is not having any fevers, chills, sweats, nausea, vomiting or diarrhea.  Otherwise she is at her baseline level of health.  She denies any lightheadedness, dizziness, or shortness of breath.  A full 10 point ROS was otherwise negative.  MEDICAL HISTORY:  Past Medical History:  Diagnosis Date   Allergic rhinitis, cause unspecified    Anxiety    Arthritis    hands, feet and  back   Asthma    Benign neoplasm of colon    Bursitis    hips   CHF (congestive heart failure) (HCC)    Cirrhosis (HCC)    auto immune   Complication of anesthesia    " i WAKE UP WITH TREMORS " after shoulder surgery tremors for 10 months after surgery surgery- 2009- surgical center in [place]   Cyst and pseudocyst of pancreas    Depressive disorder, not elsewhere classified    Diabetes mellitus without complication (HCC)    Diverticulitis    Diverticulosis of colon (without mention of hemorrhage)    Dyspnea    Dysrhythmia    arrhythmia   Esophageal reflux    Essential and other specified forms of tremor    History of GI bleed    History of kidney stones    Irritable bowel syndrome    Obesity, unspecified    Other specified cardiac dysrhythmias(427.89)    Periapical abscess without sinus    Polyneuropathy in diabetes(357.2)    PONV (postoperative nausea and vomiting)    Pure hypercholesterolemia    Sleep apnea    CPAP- non compliant   Type II or unspecified type diabetes mellitus with neurological manifestations, not stated as uncontrolled(250.60)     SURGICAL HISTORY: Past Surgical History:  Procedure Laterality Date   ABDOMINAL HYSTERECTOMY     CALCANEAL OSTEOTOMY Right 07/27/2015   Procedure: RIGHT CALCANEAL OSTEOTOMY ;  Surgeon: Toni Arthurs, MD;  Location: [facility];  Service: Orthopedics;  Laterality: Right;   CHOLECYSTECTOMY  15-20 years ago   COLONOSCOPY WITH PROPOFOL N/A 08/17/2019   Procedure: COLONOSCOPY WITH PROPOFOL;  Surgeon: Jeani Hawking, MD;  Location: [facility];  Service: Endoscopy;  Laterality: N/A;   COLONOSCOPY WITH PROPOFOL N/A 06/21/2022   Procedure: COLONOSCOPY WITH PROPOFOL;  Surgeon: Jeani Hawking, MD;  Location: [facility];  Service: Gastroenterology;  Laterality: N/A;   ESOPHAGOGASTRODUODENOSCOPY (EGD) WITH PROPOFOL N/A 08/17/2019   Procedure: ESOPHAGOGASTRODUODENOSCOPY (EGD) WITH PROPOFOL;  Surgeon: Jeani Hawking, MD;   Location: [facility] ENDOSCOPY;  Service: Endoscopy;  Laterality: N/A;   GASTROCNEMIUS RECESSION Right 07/27/2015   Procedure: RIGHT GASTROC RECESSION;  Surgeon: Toni Arthurs, MD;  Location: [facility];  Service: Orthopedics;  Laterality: Right;   KIDNEY STONE SURGERY  1980's   removal   LITHOTRIPSY     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   PARTIAL HYSTERECTOMY  1990's   POLYPECTOMY  08/17/2019   Procedure: POLYPECTOMY;  Surgeon: Jeani Hawking, MD;  Location: [facility] ENDOSCOPY;  Service: Endoscopy;;   POLYPECTOMY  06/21/2022   Procedure: POLYPECTOMY;  Surgeon: Jeani Hawking, MD;  Location: [facility];  Service: Gastroenterology;;   ROBOT ASSISTED LAPAROSCOPIC NEPHRECTOMY Left 08/14/2022   Procedure: XI ROBOTIC ASSISTED LAPAROSCOPIC NEPHRECTOMY;  Surgeon: Rene Paci, MD;  Location: [facility];  Service: Urology;  Laterality: Left;   ROTATOR CUFF REPAIR  2005   XI ROBOTIC LAPAROSCOPIC ASSISTED APPENDECTOMY N/A 08/14/2022   Procedure: XI ROBOTIC LAPAROSCOPIC ASSISTED APPENDECTOMY;  Surgeon: Romie Levee, MD;  Location: [facility];  Service: General;  Laterality: N/A;    SOCIAL HISTORY: Social History   Socioeconomic History   Marital status: Widowed    Spouse name: Not on file   Number of children: 5   Years of education: Not on file   Highest education level: Not on file  Occupational History    Employer: RETIRED  Tobacco Use   Smoking status: Never   Smokeless tobacco: Never  Vaping Use   Vaping Use: Never used  Substance and Sexual Activity   Alcohol use: No    Alcohol/week: 0.0 standard drinks of alcohol   Drug use: No   Sexual activity: Not on file  Other Topics Concern   Not on file  Social History Narrative   Husband treated for testicular cancer, 2 cups coffee a day, no exercise   Social Determinants of Health   Financial Resource Strain: Low Risk  (01/14/2022)   Overall Financial Resource Strain (CARDIA)    Difficulty of Paying Living Expenses: Not very hard  Food  Insecurity: No Food Insecurity (08/19/2022)   Hunger Vital Sign    Worried About Running Out of Food in the Last Year: Never true    Ran Out of Food in the Last Year: Never true  Transportation Needs: No Transportation Needs (08/19/2022)   PRAPARE - Administrator, Civil Service (Medical): No    Lack of Transportation (Non-Medical): No  Physical Activity: Inactive (01/14/2022)   Exercise Vital Sign    Days of Exercise per Week: 0 days    Minutes of Exercise per Session: 0 min  Stress: No Stress Concern Present (01/14/2022)   [employer] of Occupational Health - Occupational Stress Questionnaire    Feeling of Stress : Only a little  Social Connections: Not on file  Intimate Partner Violence: Not on file    FAMILY HISTORY: Family History  Problem Relation Age of Onset   Clotting disorder Mother    Cirrhosis Father    Diabetes Other  2 aunts   Cancer Other        grandmother   Allergies Other        "whole family"   Asthma Other        "whole family"    ALLERGIES:  is allergic to doxycycline, epinephrine hcl, erythromycin ethylsuccinate, iohexol, metformin, oxycodone, and tape.  MEDICATIONS:  Current Outpatient Medications  Medication Sig Dispense Refill   albuterol (PROAIR HFA) 108 (90 Base) MCG/ACT inhaler Inhale 1-2 puffs into the lungs every 6 (six) hours as needed for wheezing or shortness of breath. 1 each 2   atenolol (TENORMIN) 25 MG tablet TAKE 1/2 TO 1 TABLET BY MOUTH 2 (TWO) TIMES DAILY. (Patient taking differently: Take 25 mg by mouth 2 (two) times daily.) 180 tablet 0   azaTHIOprine (IMURAN) 50 MG tablet Take 50 mg by mouth 2 (two) times daily.     cetirizine (ZYRTEC) 10 MG tablet Take 10 mg by mouth at bedtime.     Coenzyme Q10 100 MG TABS Take 100 mg by mouth daily.     furosemide (LASIX) 40 MG tablet Take 40 mg by mouth daily.     glucose blood test strip OneTouch Ultra Blue Test Strip  TEST 3 TIMES DAILY E11.65     halobetasol  (ULTRAVATE) 0.05 % cream Apply 1 Application topically 2 (two) times daily as needed (itching).     insulin lispro (HUMALOG) 100 UNIT/ML injection Inject into the skin See admin instructions. Via Insulin Pump  NOT currently using Insulin Pump due to a malfunction per pt report on 08/12/22.     insulin lispro (HUMALOG) 100 UNIT/ML injection Inject 25 Units into the skin 3 (three) times daily before meals. 25 units at breakfast, lunch and supper     Lactobacillus Rhamnosus, GG, (CULTURELLE PO) Take 1 capsule by mouth daily.     MAGNESIUM PO Take 150 mg by mouth 2 (two) times daily.     mometasone (NASONEX) 50 MCG/ACT nasal spray Place 2 sprays into the nose daily.     pantoprazole (PROTONIX) 40 MG tablet TAKE 1 TABLET BY MOUTH EVERY DAY 30 tablet 0   Polyethyl Glyc-Propyl Glyc PF (SYSTANE HYDRATION PF) 0.4-0.3 % SOLN Place 1 drop into both eyes as needed (dry eyes).     predniSONE (DELTASONE) 10 MG tablet Take 10 mg by mouth daily with breakfast.     predniSONE (DELTASONE) 50 MG tablet Take 1 tablet 13 hours, 7 hours and 1 hour prior to CT scan injection 3 tablet 0   traMADol (ULTRAM) 50 MG tablet Take 1 tablet (50 mg total) by mouth every 6 (six) hours as needed. 30 tablet 0   Turmeric (QC TUMERIC COMPLEX PO) Take 500 mg by mouth daily. (Patient not taking: Reported on 08/08/2022)     UNABLE TO FIND Take 1 tablet by mouth daily. Med Name: Cory Roughen Vitamin     zinc gluconate 50 MG tablet Take 25 mg by mouth in the morning and at bedtime.     No current facility-administered medications for this visit.    REVIEW OF SYSTEMS:   Constitutional: ( - ) fevers, ( - )  chills , ( - ) night sweats Eyes: ( - ) blurriness of vision, ( - ) double vision, ( - ) watery eyes Ears, nose, mouth, throat, and face: ( - ) mucositis, ( - ) sore throat Respiratory: ( - ) cough, ( - ) dyspnea, ( - ) wheezes Cardiovascular: ( - ) palpitation, ( - )  chest discomfort, ( - ) lower extremity swelling Gastrointestinal:  (  - ) nausea, ( - ) heartburn, ( - ) change in bowel habits Skin: ( - ) abnormal skin rashes Lymphatics: ( - ) new lymphadenopathy, ( - ) easy bruising Neurological: ( - ) numbness, ( - ) tingling, ( - ) new weaknesses Behavioral/Psych: ( - ) mood change, ( - ) new changes  All other systems were reviewed with the patient and are negative.  PHYSICAL EXAMINATION:  Vitals:   01/06/23 1511  BP: 94/72  Pulse: (!) 57  Resp: 16  Temp: 98.1 F (36.7 C)  SpO2: 95%    Filed Weights   01/06/23 1511  Weight: 200 lb (90.7 kg)     GENERAL: Well-appearing elderly Caucasian female, alert, no distress and comfortable SKIN: skin color, texture, turgor are normal, no rashes or significant lesions EYES: conjunctiva are pink and non-injected, sclera clear LUNGS: clear to auscultation and percussion with normal breathing effort HEART: regular rate & rhythm and no murmurs and no lower extremity edema Musculoskeletal: no cyanosis of digits and no clubbing  PSYCH: alert & oriented x 3, fluent speech NEURO: no focal motor/sensory deficits  LABORATORY DATA:  I have reviewed the data as listed    Latest Ref Rng & Units 01/06/2023    2:47 PM 09/12/2022    1:41 PM 08/15/2022   10:55 AM  CBC  WBC 4.0 - 10.5 K/uL 6.5  10.3    Hemoglobin 12.0 - 15.0 g/dL 40.9  81.1  91.4   Hematocrit 36.0 - 46.0 % 39.1  41.5  37.2   Platelets 150 - 400 K/uL 125  143         Latest Ref Rng & Units 01/06/2023    2:47 PM 09/12/2022    1:41 PM 08/15/2022    4:36 AM  CMP  Glucose 70 - 99 mg/dL 782  956  213   BUN 8 - 23 mg/dL 34  30  15   Creatinine 0.44 - 1.00 mg/dL 0.86  5.78  4.69   Sodium 135 - 145 mmol/L 139  141  136   Potassium 3.5 - 5.1 mmol/L 4.5  3.8  4.8   Chloride 98 - 111 mmol/L 106  106  102   CO2 22 - 32 mmol/L 27  28  26    Calcium 8.9 - 10.3 mg/dL 9.5  9.4  8.3   Total Protein 6.5 - 8.1 g/dL 5.6  6.5    Total Bilirubin 0.3 - 1.2 mg/dL 0.9  1.0    Alkaline Phos 38 - 126 U/L 124  170    AST 15 - 41  U/L 26  49    ALT 0 - 44 U/L 23  29     RADIOGRAPHIC STUDIES: I have personally reviewed the radiological images as listed and agreed with the findings in the report: Lung nodules, most prominently in the left lower lung. CT Chest Wo Contrast  Result Date: 01/03/2023 CLINICAL DATA:  Lung nodules; history of RCC; * Tracking Code: BO * EXAM: CT CHEST WITHOUT CONTRAST TECHNIQUE: Multidetector CT imaging of the chest was performed following the standard protocol without IV contrast. RADIATION DOSE REDUCTION: This exam was performed according to the departmental dose-optimization program which includes automated exposure control, adjustment of the mA and/or kV according to patient size and/or use of iterative reconstruction technique. COMPARISON:  PET-CT dated September 26, 2022; chest CT dated July 26, 2022; chest CT dated April 06, 2015; CT abdomen  and pelvis dated May 29, 2022 FINDINGS: Cardiovascular: Normal heart size. No pericardial effusion. Normal caliber thoracic aorta with moderate atherosclerotic disease. Mitral annular calcifications. Mediastinum/Nodes: Esophagus and thyroid are unremarkable. No enlarged lymph nodes seen in the chest. Lungs/Pleura: Central airways are patent. Interval decreased size of left lower lobe pulmonary nodules. Reference cavitary nodule of the left lower lobe measuring 7 mm on series 7, image 86, decreased in size when compared with prior, previously 10 mm. Interval resolution of right lower lobe ground-glass opacities, consistent with resolved infectious/inflammatory process. Linear nodular opacities of the medial right lower lobe left lower lobe with associated calcifications located on series 7, image 17 and image 102, unchanged when compared with chest CT dated July 06, 2015 and consistent with bronchoceles. Upper Abdomen: Nodular liver contour. Prior cholecystectomy. Cystic lesion of the head of the pancreas measuring 12 mm and cystic lesion of the tail of the  pancreas measuring 7 mm, unchanged when compared with prior abdominal CT. Prior left nephrectomy. Musculoskeletal: No chest wall mass or suspicious bone lesions identified. IMPRESSION: 1. Solid pulmonary nodules of the left lower lobe, one of which is cavitary, are decreased in size when compared with the prior exam. Differential considerations include treated metastatic disease or resolving atypical infectious process, correlate with clinical history. Consider additional follow-up chest CT in 6 months to ensure complete resolution. 2. Cystic lesions of the head and tail of the pancreas, unchanged when compared with prior abdominal CT and likely a side branch IPMN. Recommend follow-up MRI/MRCP in 2 years. 3. Cirrhotic liver morphology. 4. Aortic Atherosclerosis (ICD10-I70.0). Electronically Signed   By: Allegra Lai M.D.   On: 01/03/2023 15:35    ASSESSMENT & PLAN [name] [name] 77 y.o. female with medical history significant for a left renal mass, dilated appendix who presents for a follow up visit.   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  # Pulmonary Nodules  --CT abdomen/pelvis from 05/29/2022 showed findings concerning for renal cell carcinoma.  --underwent robotic assisted laparoscopic appendectomy and nephrectomy joint procedure on 08/14/2022. --Pathology findings are consistent for a benign appendix but clear-cell RCC of left kidney. Plan:  --Etiology of the small lung nodules is unclear, though on last CT scan they are decreased in size (01/03/2023).  Will repeat CT scan in 6 months time. --Labs today show white blood cell count 6.5, Hgb 13.5, MCV 94.9, Plt 125 --Follow-up visit in 6 months time with repeat imaging.    Orders Placed This Encounter  Procedures   CT Chest Wo Contrast    Standing Status:   Future    Standing Expiration Date:   01/06/2024    Order Specific Question:   Preferred imaging location?    Answer:   [facility]   CT Abdomen Pelvis Wo Contrast     Standing Status:   Future    Standing Expiration Date:   01/06/2024    Order Specific Question:   Preferred imaging location?    Answer:   [facility]    Order Specific Question:   If indicated for the ordered procedure, I authorize the administration of oral contrast media per Radiology protocol    Answer:   Yes    Order Specific Question:   Does the patient have a contrast media/X-ray dye allergy?    Answer:   Yes    All questions were answered. The patient knows to call the clinic with any problems, questions or concerns.  A total of more than 30 minutes  were spent on this encounter with face-to-face time and non-face-to-face time, including preparing to see the patient, ordering tests and/or medications, counseling the patient and coordination of care as outlined above.   Ulysees Barns, MD Department of Hematology/Oncology [facility] Phone: [phone] Pager: [phone] Email: [email or web address]  01/06/2023 4:14 PM

## 2023-01-07 ENCOUNTER — Telehealth: Payer: Self-pay | Admitting: Hematology and Oncology

## 2023-01-08 DIAGNOSIS — E1165 Type 2 diabetes mellitus with hyperglycemia: Secondary | ICD-10-CM | POA: Diagnosis not present

## 2023-01-09 DIAGNOSIS — C642 Malignant neoplasm of left kidney, except renal pelvis: Secondary | ICD-10-CM | POA: Diagnosis not present

## 2023-01-09 DIAGNOSIS — R3915 Urgency of urination: Secondary | ICD-10-CM | POA: Diagnosis not present

## 2023-01-13 DIAGNOSIS — C641 Malignant neoplasm of right kidney, except renal pelvis: Secondary | ICD-10-CM | POA: Diagnosis not present

## 2023-01-13 DIAGNOSIS — N3020 Other chronic cystitis without hematuria: Secondary | ICD-10-CM | POA: Diagnosis not present

## 2023-01-13 DIAGNOSIS — K5730 Diverticulosis of large intestine without perforation or abscess without bleeding: Secondary | ICD-10-CM | POA: Diagnosis not present

## 2023-01-16 ENCOUNTER — Encounter: Payer: Self-pay | Admitting: Urology

## 2023-01-20 DIAGNOSIS — M533 Sacrococcygeal disorders, not elsewhere classified: Secondary | ICD-10-CM | POA: Diagnosis not present

## 2023-01-20 DIAGNOSIS — M47896 Other spondylosis, lumbar region: Secondary | ICD-10-CM | POA: Diagnosis not present

## 2023-01-20 DIAGNOSIS — M48062 Spinal stenosis, lumbar region with neurogenic claudication: Secondary | ICD-10-CM | POA: Diagnosis not present

## 2023-01-27 DIAGNOSIS — R197 Diarrhea, unspecified: Secondary | ICD-10-CM | POA: Diagnosis not present

## 2023-01-27 DIAGNOSIS — K754 Autoimmune hepatitis: Secondary | ICD-10-CM | POA: Diagnosis not present

## 2023-01-27 DIAGNOSIS — E1165 Type 2 diabetes mellitus with hyperglycemia: Secondary | ICD-10-CM | POA: Diagnosis not present

## 2023-01-27 DIAGNOSIS — K7460 Unspecified cirrhosis of liver: Secondary | ICD-10-CM | POA: Diagnosis not present

## 2023-02-05 ENCOUNTER — Other Ambulatory Visit: Payer: Self-pay | Admitting: Hematology and Oncology

## 2023-02-07 DIAGNOSIS — R8271 Bacteriuria: Secondary | ICD-10-CM | POA: Diagnosis not present

## 2023-02-07 DIAGNOSIS — N3020 Other chronic cystitis without hematuria: Secondary | ICD-10-CM | POA: Diagnosis not present

## 2023-02-17 DIAGNOSIS — E1165 Type 2 diabetes mellitus with hyperglycemia: Secondary | ICD-10-CM | POA: Diagnosis not present

## 2023-02-24 DIAGNOSIS — B9620 Unspecified Escherichia coli [E. coli] as the cause of diseases classified elsewhere: Secondary | ICD-10-CM | POA: Diagnosis not present

## 2023-02-24 DIAGNOSIS — N390 Urinary tract infection, site not specified: Secondary | ICD-10-CM | POA: Diagnosis not present

## 2023-02-24 DIAGNOSIS — R8271 Bacteriuria: Secondary | ICD-10-CM | POA: Diagnosis not present

## 2023-02-27 DIAGNOSIS — E1165 Type 2 diabetes mellitus with hyperglycemia: Secondary | ICD-10-CM | POA: Diagnosis not present

## 2023-03-10 DIAGNOSIS — N3020 Other chronic cystitis without hematuria: Secondary | ICD-10-CM | POA: Diagnosis not present

## 2023-03-18 DIAGNOSIS — M533 Sacrococcygeal disorders, not elsewhere classified: Secondary | ICD-10-CM | POA: Diagnosis not present

## 2023-03-27 DIAGNOSIS — M8580 Other specified disorders of bone density and structure, unspecified site: Secondary | ICD-10-CM | POA: Diagnosis not present

## 2023-03-27 DIAGNOSIS — E1065 Type 1 diabetes mellitus with hyperglycemia: Secondary | ICD-10-CM | POA: Diagnosis not present

## 2023-03-27 DIAGNOSIS — F419 Anxiety disorder, unspecified: Secondary | ICD-10-CM | POA: Diagnosis not present

## 2023-03-27 DIAGNOSIS — R911 Solitary pulmonary nodule: Secondary | ICD-10-CM | POA: Diagnosis not present

## 2023-03-27 DIAGNOSIS — Z9641 Presence of insulin pump (external) (internal): Secondary | ICD-10-CM | POA: Diagnosis not present

## 2023-03-27 DIAGNOSIS — C642 Malignant neoplasm of left kidney, except renal pelvis: Secondary | ICD-10-CM | POA: Diagnosis not present

## 2023-03-27 DIAGNOSIS — E7800 Pure hypercholesterolemia, unspecified: Secondary | ICD-10-CM | POA: Diagnosis not present

## 2023-03-27 DIAGNOSIS — R5383 Other fatigue: Secondary | ICD-10-CM | POA: Diagnosis not present

## 2023-03-27 DIAGNOSIS — I10 Essential (primary) hypertension: Secondary | ICD-10-CM | POA: Diagnosis not present

## 2023-03-27 DIAGNOSIS — E1165 Type 2 diabetes mellitus with hyperglycemia: Secondary | ICD-10-CM | POA: Diagnosis not present

## 2023-03-27 DIAGNOSIS — K754 Autoimmune hepatitis: Secondary | ICD-10-CM | POA: Diagnosis not present

## 2023-03-30 DIAGNOSIS — E1165 Type 2 diabetes mellitus with hyperglycemia: Secondary | ICD-10-CM | POA: Diagnosis not present

## 2023-03-31 DIAGNOSIS — N183 Chronic kidney disease, stage 3 unspecified: Secondary | ICD-10-CM | POA: Diagnosis not present

## 2023-03-31 DIAGNOSIS — E1122 Type 2 diabetes mellitus with diabetic chronic kidney disease: Secondary | ICD-10-CM | POA: Diagnosis not present

## 2023-03-31 DIAGNOSIS — D692 Other nonthrombocytopenic purpura: Secondary | ICD-10-CM | POA: Diagnosis not present

## 2023-03-31 DIAGNOSIS — K7460 Unspecified cirrhosis of liver: Secondary | ICD-10-CM | POA: Diagnosis not present

## 2023-03-31 DIAGNOSIS — I471 Supraventricular tachycardia, unspecified: Secondary | ICD-10-CM | POA: Diagnosis not present

## 2023-03-31 DIAGNOSIS — I130 Hypertensive heart and chronic kidney disease with heart failure and stage 1 through stage 4 chronic kidney disease, or unspecified chronic kidney disease: Secondary | ICD-10-CM | POA: Diagnosis not present

## 2023-03-31 DIAGNOSIS — E1142 Type 2 diabetes mellitus with diabetic polyneuropathy: Secondary | ICD-10-CM | POA: Diagnosis not present

## 2023-03-31 DIAGNOSIS — K754 Autoimmune hepatitis: Secondary | ICD-10-CM | POA: Diagnosis not present

## 2023-03-31 DIAGNOSIS — I5032 Chronic diastolic (congestive) heart failure: Secondary | ICD-10-CM | POA: Diagnosis not present

## 2023-03-31 DIAGNOSIS — Z6835 Body mass index (BMI) 35.0-35.9, adult: Secondary | ICD-10-CM | POA: Diagnosis not present

## 2023-03-31 DIAGNOSIS — Z9641 Presence of insulin pump (external) (internal): Secondary | ICD-10-CM | POA: Diagnosis not present

## 2023-04-08 DIAGNOSIS — J069 Acute upper respiratory infection, unspecified: Secondary | ICD-10-CM | POA: Diagnosis not present

## 2023-04-17 DIAGNOSIS — J069 Acute upper respiratory infection, unspecified: Secondary | ICD-10-CM | POA: Diagnosis not present

## 2023-04-25 DIAGNOSIS — E1165 Type 2 diabetes mellitus with hyperglycemia: Secondary | ICD-10-CM | POA: Diagnosis not present

## 2023-04-26 ENCOUNTER — Encounter (HOSPITAL_BASED_OUTPATIENT_CLINIC_OR_DEPARTMENT_OTHER): Payer: Self-pay

## 2023-04-26 ENCOUNTER — Telehealth: Payer: Self-pay | Admitting: Nurse Practitioner

## 2023-04-26 ENCOUNTER — Emergency Department (HOSPITAL_BASED_OUTPATIENT_CLINIC_OR_DEPARTMENT_OTHER): Payer: PPO

## 2023-04-26 ENCOUNTER — Emergency Department (HOSPITAL_BASED_OUTPATIENT_CLINIC_OR_DEPARTMENT_OTHER)
Admission: EM | Admit: 2023-04-26 | Discharge: 2023-04-26 | Disposition: A | Discharge status: Home / Self Care | Payer: PPO

## 2023-04-26 ENCOUNTER — Other Ambulatory Visit: Payer: Self-pay

## 2023-04-26 DIAGNOSIS — Z1152 Encounter for screening for COVID-19: Secondary | ICD-10-CM | POA: Insufficient documentation

## 2023-04-26 DIAGNOSIS — R1031 Right lower quadrant pain: Secondary | ICD-10-CM | POA: Diagnosis present

## 2023-04-26 DIAGNOSIS — Z905 Acquired absence of kidney: Secondary | ICD-10-CM | POA: Diagnosis not present

## 2023-04-26 DIAGNOSIS — N3000 Acute cystitis without hematuria: Secondary | ICD-10-CM | POA: Diagnosis not present

## 2023-04-26 DIAGNOSIS — K5732 Diverticulitis of large intestine without perforation or abscess without bleeding: Secondary | ICD-10-CM | POA: Insufficient documentation

## 2023-04-26 DIAGNOSIS — K7689 Other specified diseases of liver: Secondary | ICD-10-CM | POA: Diagnosis not present

## 2023-04-26 DIAGNOSIS — Z85528 Personal history of other malignant neoplasm of kidney: Secondary | ICD-10-CM | POA: Insufficient documentation

## 2023-04-26 DIAGNOSIS — K5792 Diverticulitis of intestine, part unspecified, without perforation or abscess without bleeding: Secondary | ICD-10-CM

## 2023-04-26 DIAGNOSIS — Z794 Long term (current) use of insulin: Secondary | ICD-10-CM | POA: Insufficient documentation

## 2023-04-26 DIAGNOSIS — E119 Type 2 diabetes mellitus without complications: Secondary | ICD-10-CM | POA: Diagnosis not present

## 2023-04-26 DIAGNOSIS — R9431 Abnormal electrocardiogram [ECG] [EKG]: Secondary | ICD-10-CM | POA: Diagnosis not present

## 2023-04-26 DIAGNOSIS — K5730 Diverticulosis of large intestine without perforation or abscess without bleeding: Secondary | ICD-10-CM | POA: Diagnosis not present

## 2023-04-26 HISTORY — DX: Malignant neoplasm of unspecified kidney, except renal pelvis: C64.9

## 2023-04-26 LAB — URINALYSIS, ROUTINE W REFLEX MICROSCOPIC
Bilirubin Urine: NEGATIVE
Glucose, UA: NEGATIVE mg/dL
Ketones, ur: NEGATIVE mg/dL
Nitrite: POSITIVE — AB
Specific Gravity, Urine: 1.02 (ref 1.005–1.030)
WBC, UA: >50 WBC/hpf (ref 0–5)
pH: 6 (ref 5.0–8.0)

## 2023-04-26 LAB — COMPREHENSIVE METABOLIC PANEL
ALT: 24 U/L (ref 0–44)
AST: 31 U/L (ref 15–41)
Albumin: 3.5 g/dL (ref 3.5–5.0)
Alkaline Phosphatase: 119 U/L (ref 38–126)
Anion gap: 8 (ref 5–15)
BUN: 27 mg/dL — ABNORMAL HIGH (ref 8–23)
CO2: 23 mmol/L (ref 22–32)
Calcium: 9.3 mg/dL (ref 8.9–10.3)
Chloride: 106 mmol/L (ref 98–111)
Creatinine, Ser: 1.19 mg/dL — ABNORMAL HIGH (ref 0.44–1.00)
GFR, Estimated: 47 mL/min — ABNORMAL LOW (ref >60)
Glucose, Bld: 165 mg/dL — ABNORMAL HIGH (ref 70–99)
Potassium: 4.2 mmol/L (ref 3.5–5.1)
Sodium: 137 mmol/L (ref 135–145)
Total Bilirubin: 1.7 mg/dL — ABNORMAL HIGH (ref 0.3–1.2)
Total Protein: 6.1 g/dL — ABNORMAL LOW (ref 6.5–8.1)

## 2023-04-26 LAB — CBC
HCT: 42.6 % (ref 36.0–46.0)
Hemoglobin: 14.3 g/dL (ref 12.0–15.0)
MCH: 32 pg (ref 26.0–34.0)
MCHC: 33.6 g/dL (ref 30.0–36.0)
MCV: 95.3 fL (ref 80.0–100.0)
Platelets: 125 10*3/uL — ABNORMAL LOW (ref 150–400)
RBC: 4.47 MIL/uL (ref 3.87–5.11)
RDW: 14.6 % (ref 11.5–15.5)
WBC: 8.1 10*3/uL (ref 4.0–10.5)
nRBC: 0 % (ref 0.0–0.2)

## 2023-04-26 LAB — SARS CORONAVIRUS 2 BY RT PCR: SARS Coronavirus 2 by RT PCR: NEGATIVE

## 2023-04-26 LAB — LIPASE, BLOOD: Lipase: 36 U/L (ref 11–51)

## 2023-04-26 MED ORDER — METRONIDAZOLE 500 MG PO TABS: 500.0000 mg | ORAL_TABLET | Freq: Two times a day (BID) | ORAL | 0 refills | Status: DC

## 2023-04-26 MED ORDER — TRAMADOL HCL 50 MG PO TABS
50.0000 mg | ORAL_TABLET | Freq: Once | ORAL | Status: AC
  Administered 2023-04-26: 50 mg via ORAL
  Filled 2023-04-26: qty 1

## 2023-04-26 MED ORDER — CIPROFLOXACIN HCL 500 MG PO TABS
500.0000 mg | ORAL_TABLET | Freq: Once | ORAL | Status: AC
  Administered 2023-04-26: 500 mg via ORAL
  Filled 2023-04-26: qty 1

## 2023-04-26 MED ORDER — OXYCODONE HCL 5 MG PO TABS: 5.0000 mg | ORAL_TABLET | Freq: Four times a day (QID) | ORAL | 0 refills | Status: DC | PRN

## 2023-04-26 MED ORDER — ONDANSETRON 4 MG PO TBDP
4.0000 mg | ORAL_TABLET | Freq: Once | ORAL | Status: DC
  Filled 2023-04-26: qty 1

## 2023-04-26 MED ORDER — CIPROFLOXACIN HCL 500 MG PO TABS
500.0000 mg | ORAL_TABLET | Freq: Two times a day (BID) | ORAL | 0 refills | Status: DC
Start: 2023-04-26 — End: 2023-06-09

## 2023-04-26 MED ORDER — DICYCLOMINE HCL 20 MG PO TABS
20.0000 mg | ORAL_TABLET | Freq: Two times a day (BID) | ORAL | 0 refills | Status: DC
Start: 2023-04-26 — End: 2023-06-13

## 2023-04-26 MED ORDER — ONDANSETRON 4 MG PO TBDP
4.0000 mg | ORAL_TABLET | Freq: Three times a day (TID) | ORAL | 0 refills | Status: DC | PRN
Start: 2023-04-26 — End: 2023-06-06

## 2023-04-26 MED ORDER — METRONIDAZOLE 500 MG PO TABS
500.0000 mg | ORAL_TABLET | Freq: Once | ORAL | Status: AC
  Administered 2023-04-26: 500 mg via ORAL
  Filled 2023-04-26: qty 1

## 2023-04-26 NOTE — ED Notes (Signed)
Patient transported to CT 

## 2023-04-26 NOTE — ED Triage Notes (Signed)
Pt presents with bilateral lower abd pain x 2 days with subjective fever last PM. Denies N/V/D. Pt with hx of diverticulitis, kidney removal, and appendectomy r/t CA within the past year.

## 2023-04-26 NOTE — Discharge Instructions (Signed)
It was a pleasure taking care of you here in the emergency department.  Your urine did show a urinary tract infection.  Your CT scan did show a possible diverticulitis.  We have prescribed you ciprofloxacin and metronidazole which are antibiotics that will treat both of your infections.  I written you for a short course of pain medicine, oxycodone.  This medication may make you sleepy.  Can also make you nauseous.  I recommend taking the Zofran tablet prior to taking oxycodone.  I have also written for a medication called Bentyl.  This is an abdominal spasm medication.  This may also help with your pain.  Make sure to follow-up with your gastroenterologist, Dr. Elnoria Howard as well as your primary care provider.  Return for new or worsening symptoms such as persistent high fever, persistent nausea or vomiting, pain not controlled with oral medication.

## 2023-04-26 NOTE — Telephone Encounter (Signed)
Daughter called the answering service. Mother having RLQ pain which started last night. Pain is constant. BMs are at baseline. No urinary symptoms. No blood in stool or urine. She has a history of diverticulitis and asking about antibiotics.  She is s/p appendectomy. She is on prednisone and imuran for autoimmune hepatitis.   Discussed with daughter. I think given co-morbidities and immunocompromised state that she needs evaluation in lieu of treating empirically with antibiotics . Advised Urgent Care evaluation ( they want to avoid ED)  Will forward to patient's primary GI , Dr. Elnoria Howard so his office can follow up on her

## 2023-04-26 NOTE — ED Provider Notes (Signed)
[place] EMERGENCY DEPARTMENT AT [facility] Provider Note   CSN: [id number] Arrival date & time: 04/26/23  1233    History  Chief Complaint  Patient presents with   Abdominal Pain    [name] [name] is a 77 y.o. 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  Goes into her right flank, however states she has some chronic back pain at baseline..  Also notes some dysuria and frequency as well as multiple episodes of loose stool.  No blood in stool.  No hematuria.  Has had some chills without documented fever.  Some nausea without vomiting.  Also notes she has had a "bug" with congestion, cough last week.  Symptoms improved currently.  No chest pain, shortness of breath.  No recent falls or injuries.  States she has chronic diarrhea at baseline.  She is followed with Dr. Elnoria Howard with GI.  Not currently under treatment for her prior cancer.  She states she also feels like she has a urinary tract infection that she also think she has diverticulitis as well.  Does have history of prior kidney stones.  No meds at home.  States she is not allowed to take Tylenol, ibuprofen and does not do well with "pain meds."  Patient does states she has frequent UTIs.  Last 2 months ago.  States she typically either gets 1 or Keflex.  She is not on prophylactic antibiotics however is on Myrbetriq as well as cranberry supplementation.  HPI     Home Medications Prior to Admission medications   Medication Sig Start Date End Date Taking? Authorizing Provider  ciprofloxacin (CIPRO) 500 MG tablet Take 1 tablet (500 mg total) by mouth every 12 (twelve) hours. 04/26/23  Yes Lyly Canizales A, PA-C  dicyclomine (BENTYL) 20 MG tablet Take 1 tablet (20 mg total) by mouth 2 (two) times daily. 04/26/23  Yes Sheba Whaling A, PA-C  metroNIDAZOLE  (FLAGYL) 500 MG tablet Take 1 tablet (500 mg total) by mouth 2 (two) times daily. 04/26/23  Yes Adriell Polansky A, PA-C  ondansetron (ZOFRAN-ODT) 4 MG disintegrating tablet Take 1 tablet (4 mg total) by mouth every 8 (eight) hours as needed. 04/26/23  Yes Samreet Edenfield A, PA-C  oxyCODONE (ROXICODONE) 5 MG immediate release tablet Take 1 tablet (5 mg total) by mouth every 6 (six) hours as needed for severe pain. 04/26/23  Yes Sheena Donegan A, PA-C  albuterol (PROAIR HFA) 108 (90 Base) MCG/ACT inhaler Inhale 1-2 puffs into the lungs every 6 (six) hours as needed for wheezing or shortness of breath. 04/06/20   Parrett, Tammy S, NP  atenolol (TENORMIN) 25 MG tablet TAKE 1/2 TO 1 TABLET BY MOUTH 2 (TWO) TIMES DAILY. Patient taking differently: Take 25 mg by mouth 2 (two) times daily. 07/16/22   Runell Gess, MD  azaTHIOprine (IMURAN) 50 MG tablet Take 50 mg by mouth 2 (two) times daily. 07/30/19   [provider]  cetirizine (ZYRTEC) 10 MG tablet Take 10 mg by mouth at bedtime.    [provider]  Coenzyme Q10 100 MG TABS Take 100 mg by mouth daily.    [provider]  furosemide (LASIX) 40 MG tablet Take 40 mg by mouth daily.    [provider]  glucose blood test strip OneTouch Ultra Blue Test Strip  TEST 3 TIMES DAILY E11.65    [provider]  halobetasol (ULTRAVATE) 0.05 % cream Apply 1 Application topically 2 (two) times daily as needed (itching).    [provider]  insulin lispro (HUMALOG) 100 UNIT/ML injection Inject into the skin See admin instructions. Via Insulin Pump  NOT currently using Insulin Pump due to a malfunction per pt report on 08/12/22.    [provider]  insulin lispro (HUMALOG) 100 UNIT/ML injection Inject 25 Units into the skin 3 (three) times daily before meals. 25 units at breakfast, lunch and supper    [provider]  Lactobacillus Rhamnosus, GG, (CULTURELLE PO) Take 1 capsule by mouth daily.     [provider]  MAGNESIUM PO Take 150 mg by mouth 2 (two) times daily.    [provider]  mometasone (NASONEX) 50 MCG/ACT nasal spray Place 2 sprays into the nose daily.    [provider]  pantoprazole (PROTONIX) 40 MG tablet TAKE 1 TABLET BY MOUTH EVERY DAY 09/17/19   Wendling, Jilda Roche, DO  Polyethyl Glyc-Propyl Glyc PF (SYSTANE HYDRATION PF) 0.4-0.3 % SOLN Place 1 drop into both eyes as needed (dry eyes).    [provider]  predniSONE (DELTASONE) 10 MG tablet Take 10 mg by mouth daily with breakfast.    [provider]  predniSONE (DELTASONE) 50 MG tablet Take 1 tablet 13 hours, 7 hours and 1 hour prior to CT scan injection 06/12/22   Jaci Standard, MD  traMADol (ULTRAM) 50 MG tablet Take 1 tablet (50 mg total) by mouth every 6 (six) hours as needed. 09/12/22   Jaci Standard, MD  Turmeric (QC TUMERIC COMPLEX PO) Take 500 mg by mouth daily. Patient not taking: Reported on 08/08/2022    [provider]  UNABLE TO FIND Take 1 tablet by mouth daily. Med Name: Cory Roughen Vitamin    [provider]  zinc gluconate 50 MG tablet Take 25 mg by mouth in the morning and at bedtime.    [provider]      Allergies    Doxycycline, Epinephrine hcl, Erythromycin ethylsuccinate, Iohexol, Metformin, Oxycodone, and Tape    Review of Systems   Review of Systems  Constitutional:  Positive for chills.  HENT:  Positive for postnasal drip and rhinorrhea. Negative for sore throat.   Respiratory:  Positive for cough.   Cardiovascular: Negative.   Gastrointestinal:  Positive for abdominal pain, diarrhea and nausea. Negative for abdominal distention, anal bleeding, blood in stool, constipation, rectal pain and vomiting.  Genitourinary: Negative.   Musculoskeletal: Negative.   Skin: Negative.   Neurological: Negative.   All other systems reviewed and are negative.   Physical Exam Updated Vital Signs BP (!) 154/67   Pulse  (!) 53   Temp 98.4 F (36.9 C) (Temporal)   Resp 17   Ht 5\' 3"  (1.6 m)   Wt 90.7 kg   SpO2 98%   BMI 35.43 kg/m  Physical Exam Vitals and nursing note reviewed.  Constitutional:      General: She is not in acute distress.    Appearance: She is well-developed. She is not ill-appearing, toxic-appearing or diaphoretic.  HENT:     Head: Normocephalic and atraumatic.     Mouth/Throat:     Mouth: Mucous membranes are moist.  Eyes:     Pupils: Pupils are equal, round, and reactive to light.  Cardiovascular:     Rate and Rhythm: Normal rate.     Heart sounds: Normal heart sounds.  Pulmonary:  Effort: Pulmonary effort is normal. No respiratory distress.     Breath sounds: Normal breath sounds.     Comments: Clear bilaterally, speaks in full sentences without difficulty Abdominal:     General: Bowel sounds are normal. There is no distension.     Palpations: Abdomen is soft.     Tenderness: There is abdominal tenderness in the right lower quadrant, suprapubic area and left lower quadrant. There is no left CVA tenderness, guarding or rebound. Negative signs include Murphy's sign.     Comments: Soft, diffusely tender to lower abdomen and right flank, negative Murphy sign, McBurney point, no fluid wave.  Musculoskeletal:        General: Normal range of motion.     Cervical back: Normal range of motion.  Skin:    General: Skin is warm and dry.     Capillary Refill: Capillary refill takes less than 2 seconds.  Neurological:     General: No focal deficit present.     Mental Status: She is alert and oriented to person, place, and time.     Cranial Nerves: No cranial nerve deficit.     Motor: No weakness.  Psychiatric:        Mood and Affect: Mood normal.     ED Results / Procedures / Treatments   Labs (all labs ordered are listed, but only abnormal results are displayed) Labs Reviewed  COMPREHENSIVE METABOLIC PANEL - Abnormal; Notable for the following components:      Result  Value   Glucose, Bld 165 (*)    BUN 27 (*)    Creatinine, Ser 1.19 (*)    Total Protein 6.1 (*)    Total Bilirubin 1.7 (*)    GFR, Estimated 47 (*)    All other components within normal limits  CBC - Abnormal; Notable for the following components:   Platelets 125 (*)    All other components within normal limits  URINALYSIS, ROUTINE W REFLEX MICROSCOPIC - Abnormal; Notable for the following components:   APPearance HAZY (*)    Hgb urine dipstick TRACE (*)    Protein, ur TRACE (*)    Nitrite POSITIVE (*)    Leukocytes,Ua LARGE (*)    Bacteria, UA MANY (*)    Non Squamous Epithelial 0-5 (*)    All other components within normal limits  SARS CORONAVIRUS 2 BY RT PCR  LIPASE, BLOOD    EKG EKG Interpretation Date/Time:  Saturday April 26 2023 13:08:33 EDT Ventricular Rate:  63 PR Interval:  158 QRS Duration:  80 QT Interval:  398 QTC Calculation: 407 R Axis:   -16  Text Interpretation: Sinus rhythm with Premature atrial complexes Cannot rule out Anterior infarct , age undetermined Abnormal ECG When compared with ECG of 17-May-2020 02:33, PREVIOUS ECG IS PRESENT Confirmed by Estanislado Pandy [phone]) on 04/26/2023 2:10:50 PM  Radiology CT ABDOMEN PELVIS WO CONTRAST  Result Date: 04/26/2023 CLINICAL DATA:  Right lower quadrant and right flank pain. Diarrhea. Previous left nephrectomy. History of diverticulitis. EXAM: CT ABDOMEN AND PELVIS WITHOUT CONTRAST TECHNIQUE: Multidetector CT imaging of the abdomen and pelvis was performed following the standard protocol without IV contrast. RADIATION DOSE REDUCTION: This exam was performed according to the departmental dose-optimization program which includes automated exposure control, adjustment of the mA and/or kV according to patient size and/or use of iterative reconstruction technique. COMPARISON:  PET-CT 09/26/2022. Standard abdomen and pelvis CT 01/13/2023, 05/29/2022. Older exams as well. FINDINGS: Lower chest: There is some linear opacity  lung bases  likely scar or atelectasis. No pleural effusion. There are areas of bronchiectasis along the lower lobes bilaterally with some opacity more prominent along the medial right lower lobe but again unchanged from prior. Hepatobiliary: Nodular contours of the liver on this noncontrast examination. Please correlate for any history of chronic liver disease. Previous cholecystectomy. Pancreas: Previously there is a small cystic focus in the pancreatic neck measuring 15 mm which is stable today. This has been present since at least 2012, benign lesion. No imaging follow-up. Spleen: Normal in size without focal abnormality. Adrenals/Urinary Tract: Prior removal of the left adrenal gland and left kidney. No abnormal soft tissue in the surgical bed. Right adrenal gland is preserved. No right-sided renal collecting system dilatation. The right ureter has a normal course and caliber extending down to the bladder. Prominent right-sided renal sinus fat. No right-sided renal or ureteral stone. Preserved contours of the urinary bladder. Stable smaller areas of high density along the bladder wall towards the dome on the right side, nonspecific. Stomach/Bowel: Stomach is underdistended but has a somewhat more thick wall than expected even for this level of distention. Please correlate for any gastric fold thickening or symptomatology. Additional evaluation can be considered when clinically appropriate. Small bowel is nondilated. The large bowel has a normal caliber with diffuse stool and diverticula. There is a area of stranding along the proximal transverse colon on axial series 2, image 37 and coronal series 5, image 68. There is also an area that is slightly more extensive involving the proximal to mid sigmoid colon as seen on axial series 2 image 69 and coronal series 5 image 45. This could represent multifocal areas of diverticulitis. Both of these areas appear simple. No complicating features of free air, obstruction  or well-defined fluid collection. Vascular/Lymphatic: Vascular calcifications identified. Normal caliber aorta and IVC. No specific abnormal lymph node enlargement identified in the abdomen and pelvis. Reproductive: Status post hysterectomy. No adnexal masses. Other: No free intra-air.  No ascites. Musculoskeletal: Moderate degenerative changes along the spine. Multilevel posterior osteophytes and disc bulging along the lumbar spine with the associated stenosis including L2-3, L3-4, L4-5 and L5-S1. Scattered degenerative changes of the pelvis. IMPRESSION: Colonic diverticulosis. There are 2 areas of wall thickening and stranding along the colon. This includes the mid to distal sigmoid colon and smaller area along the proximal transverse colon. Based on appearance these could represent areas of diverticulitis. No complicating features at this time. Recommend follow up to confirm resolution. Stomach is collapsed but slightly more wall thickening than expected for this level of collapsed. Please correlate with any gastric symptoms. Previous left adrenalectomy and nephrectomy. Previous cholecystectomy. Slightly nodular contours of the liver. Stable pancreatic cystic lesion going back to 2012. No additional follow-up. Electronically Signed   By: Karen Kays M.D.   On: 04/26/2023 16:09    Procedures Procedures    Medications Ordered in ED Medications  ondansetron (ZOFRAN-ODT) disintegrating tablet 4 mg (4 mg Oral Not Given 04/26/23 1448)  ciprofloxacin (CIPRO) tablet 500 mg (has no administration in time range)  metroNIDAZOLE (FLAGYL) tablet 500 mg (has no administration in time range)  traMADol (ULTRAM) tablet 50 mg (50 mg Oral Given 04/26/23 1448)    ED Course/ Medical Decision Making/ A&P   77 year old multiple medical problems here for evaluation of lower abdominal pain which began yesterday.  States she recently got over a "bug."  With URI symptoms.  She has chronic diarrhea at baseline.  Denies BRBPR  or melena.  She feels like  she has a UTI and diverticulitis.  Recent left nephrectomy and appendectomy due to cancer.  Not currently undergoing treatment as she states she is in remission as far she knows.  Has had chills without documented fever.  On arrival she is afebrile, nonseptic, not ill-appearing.  Her heart and lungs are clear.  Her abdomen is diffusely tender to her lower abdomen as well as has some right flank pain however has negative CVA tap.  Will plan on labs, imaging, pain control.  Patient does not want IV pain control at this time as she states she has had a "bad reaction" to IV pain medication which she describes nausea as well as hallucinations from pain medication.  Also states she cannot take NSAIDs or Tylenol with direct her last medical history.  She is agreeable for tramadol she states she has had this previously and tolerated well  Labs and imaging personally viewed interpreted:  CBC without leukocytosis, platelets 125 Metabolic panel glucose 165, creatinine 1.19, down from baseline, T. bili 1.7, similar to prior Lipase 36 UA positive for infection  Reviewed prior medical records, cannot see her most recent PCP notes from UTI 2 months ago for culture.  DC prior urine culture from 2023, E. coli pansensitive as well as an 2021 Proteus pansensitive aside from Macrobid  CT abdomen pelvis shows possible diverticulitis.  Patient reassessed.  Her pain has improved from prior.  She is tolerating p.o. intake.  We discussed labs and imaging with patient, family in room.  Will treat with Cipro/Flagyl for diverticulitis, antibiotics will also cover for her UTI.  Will have her follow-up with her gastroenterologist as well as urology as she has frequent urinary tract infections.  She will return for new or worsening symptoms.  Patient is nontoxic, nonseptic appearing, in no apparent distress.  Patient's pain and other symptoms adequately managed in emergency department.  Fluid bolus given.   Labs, imaging and vitals reviewed.  Patient does not meet the SIRS or Sepsis criteria.  On repeat exam patient does not have a surgical abdomin and there are no peritoneal signs.  No indication of appendicitis, bowel obstruction, bowel perforation, cholecystitis, sepsis, AAA, dissection, renal failure, ischemia.  Patient discharged home with symptomatic treatment and given strict instructions for follow-up with their primary care physician.  I have also discussed reasons to return immediately to the ER.  Patient expresses understanding and agrees with plan.                                 Medical Decision Making Amount and/or Complexity of Data Reviewed Independent Historian:     Details: Family in room External Data Reviewed: labs, radiology and notes. Labs: ordered. Decision-making details documented in ED Course. Radiology: ordered and independent interpretation performed. Decision-making details documented in ED Course.  Risk OTC drugs. Prescription drug management. Decision regarding hospitalization. Diagnosis or treatment significantly limited by social determinants of health.          Final Clinical Impression(s) / ED Diagnoses Final diagnoses:  Acute cystitis without hematuria  Diverticulitis    Rx / DC Orders ED Discharge Orders          Ordered    ondansetron (ZOFRAN-ODT) 4 MG disintegrating tablet  Every 8 hours PRN        04/26/23 1630    ciprofloxacin (CIPRO) 500 MG tablet  Every 12 hours        04/26/23 1630  metroNIDAZOLE (FLAGYL) 500 MG tablet  2 times daily        04/26/23 1630    oxyCODONE (ROXICODONE) 5 MG immediate release tablet  Every 6 hours PRN        04/26/23 1630    dicyclomine (BENTYL) 20 MG tablet  2 times daily        04/26/23 1630              Doroteo Nickolson A, PA-C 04/26/23 1635    YoungHarmon Dun, DO 04/27/23 0710

## 2023-04-29 DIAGNOSIS — E1165 Type 2 diabetes mellitus with hyperglycemia: Secondary | ICD-10-CM | POA: Diagnosis not present

## 2023-04-30 DIAGNOSIS — K754 Autoimmune hepatitis: Secondary | ICD-10-CM | POA: Diagnosis not present

## 2023-04-30 DIAGNOSIS — K5732 Diverticulitis of large intestine without perforation or abscess without bleeding: Secondary | ICD-10-CM | POA: Diagnosis not present

## 2023-04-30 DIAGNOSIS — K7460 Unspecified cirrhosis of liver: Secondary | ICD-10-CM | POA: Diagnosis not present

## 2023-05-06 DIAGNOSIS — Z8709 Personal history of other diseases of the respiratory system: Secondary | ICD-10-CM | POA: Diagnosis not present

## 2023-05-06 DIAGNOSIS — D692 Other nonthrombocytopenic purpura: Secondary | ICD-10-CM | POA: Diagnosis not present

## 2023-05-06 DIAGNOSIS — K7460 Unspecified cirrhosis of liver: Secondary | ICD-10-CM | POA: Diagnosis not present

## 2023-05-06 DIAGNOSIS — K754 Autoimmune hepatitis: Secondary | ICD-10-CM | POA: Diagnosis not present

## 2023-05-08 ENCOUNTER — Other Ambulatory Visit: Payer: Self-pay | Admitting: Cardiovascular Disease

## 2023-05-11 ENCOUNTER — Telehealth: Payer: Self-pay | Admitting: Gastroenterology

## 2023-05-11 DIAGNOSIS — D692 Other nonthrombocytopenic purpura: Secondary | ICD-10-CM | POA: Diagnosis not present

## 2023-05-11 DIAGNOSIS — M545 Low back pain, unspecified: Secondary | ICD-10-CM | POA: Diagnosis not present

## 2023-05-11 DIAGNOSIS — R1011 Right upper quadrant pain: Secondary | ICD-10-CM | POA: Diagnosis not present

## 2023-05-11 DIAGNOSIS — R1032 Left lower quadrant pain: Secondary | ICD-10-CM | POA: Diagnosis not present

## 2023-05-11 DIAGNOSIS — R600 Localized edema: Secondary | ICD-10-CM | POA: Diagnosis not present

## 2023-05-11 NOTE — Telephone Encounter (Signed)
Patient's daughter, Josephina Shih, called the on-call service.  Patient tapering off the Imuran for her autoimmune hepatitis/cirrhosis.  When daughter spoke with her mother today she said that she was having some lower extremity swelling, maybe swelling in her upper abdomen.  Mother was tearful and seems to be having a "mental breakdown".  Patient and daughter not sure that she was doing Imuran taper correctly.  Advised that I do not know if any of this is related to tapering off the Imuran, but certainly with cirrhosis she did have confusion and fluid accumulation.  My review of records is limited.  Advised that I would forward to Dr. Elnoria Howard for his office to reach out with an update on the patient tomorrow, Monday.  Advised to go to urgent care if patient needs more urgent evaluation.

## 2023-05-19 DIAGNOSIS — R600 Localized edema: Secondary | ICD-10-CM | POA: Diagnosis not present

## 2023-05-19 DIAGNOSIS — K754 Autoimmune hepatitis: Secondary | ICD-10-CM | POA: Diagnosis not present

## 2023-05-19 DIAGNOSIS — M519 Unspecified thoracic, thoracolumbar and lumbosacral intervertebral disc disorder: Secondary | ICD-10-CM | POA: Diagnosis not present

## 2023-05-19 DIAGNOSIS — M5442 Lumbago with sciatica, left side: Secondary | ICD-10-CM | POA: Diagnosis not present

## 2023-05-19 DIAGNOSIS — R1011 Right upper quadrant pain: Secondary | ICD-10-CM | POA: Diagnosis not present

## 2023-05-19 DIAGNOSIS — R1032 Left lower quadrant pain: Secondary | ICD-10-CM | POA: Diagnosis not present

## 2023-05-23 ENCOUNTER — Other Ambulatory Visit: Payer: Self-pay | Admitting: Cardiovascular Disease

## 2023-05-26 DIAGNOSIS — E1165 Type 2 diabetes mellitus with hyperglycemia: Secondary | ICD-10-CM | POA: Diagnosis not present

## 2023-05-29 DIAGNOSIS — M7918 Myalgia, other site: Secondary | ICD-10-CM | POA: Diagnosis not present

## 2023-05-29 DIAGNOSIS — M48062 Spinal stenosis, lumbar region with neurogenic claudication: Secondary | ICD-10-CM | POA: Diagnosis not present

## 2023-05-29 DIAGNOSIS — M47816 Spondylosis without myelopathy or radiculopathy, lumbar region: Secondary | ICD-10-CM | POA: Diagnosis not present

## 2023-05-30 DIAGNOSIS — E1165 Type 2 diabetes mellitus with hyperglycemia: Secondary | ICD-10-CM | POA: Diagnosis not present

## 2023-06-05 DIAGNOSIS — M25551 Pain in right hip: Secondary | ICD-10-CM | POA: Diagnosis not present

## 2023-06-05 DIAGNOSIS — M25572 Pain in left ankle and joints of left foot: Secondary | ICD-10-CM | POA: Diagnosis not present

## 2023-06-06 ENCOUNTER — Observation Stay (HOSPITAL_COMMUNITY): Payer: PPO

## 2023-06-06 ENCOUNTER — Emergency Department (HOSPITAL_COMMUNITY): Payer: PPO

## 2023-06-06 ENCOUNTER — Inpatient Hospital Stay (HOSPITAL_COMMUNITY)
Admission: EM | Admit: 2023-06-06 | Discharge: 2023-06-09 | DRG: 552 | Disposition: A | Discharge status: Home Health | Payer: PPO | Attending: Internal Medicine | Admitting: Internal Medicine

## 2023-06-06 ENCOUNTER — Encounter (HOSPITAL_COMMUNITY): Payer: Self-pay

## 2023-06-06 ENCOUNTER — Other Ambulatory Visit: Payer: Self-pay

## 2023-06-06 DIAGNOSIS — F4325 Adjustment disorder with mixed disturbance of emotions and conduct: Secondary | ICD-10-CM

## 2023-06-06 DIAGNOSIS — Z9641 Presence of insulin pump (external) (internal): Secondary | ICD-10-CM | POA: Diagnosis present

## 2023-06-06 DIAGNOSIS — Z888 Allergy status to other drugs, medicaments and biological substances status: Secondary | ICD-10-CM

## 2023-06-06 DIAGNOSIS — M4807 Spinal stenosis, lumbosacral region: Principal | ICD-10-CM | POA: Diagnosis present

## 2023-06-06 DIAGNOSIS — F419 Anxiety disorder, unspecified: Secondary | ICD-10-CM | POA: Diagnosis present

## 2023-06-06 DIAGNOSIS — I10 Essential (primary) hypertension: Secondary | ICD-10-CM | POA: Diagnosis present

## 2023-06-06 DIAGNOSIS — Z7952 Long term (current) use of systemic steroids: Secondary | ICD-10-CM

## 2023-06-06 DIAGNOSIS — M47815 Spondylosis without myelopathy or radiculopathy, thoracolumbar region: Secondary | ICD-10-CM | POA: Diagnosis not present

## 2023-06-06 DIAGNOSIS — N1831 Chronic kidney disease, stage 3a: Secondary | ICD-10-CM | POA: Diagnosis not present

## 2023-06-06 DIAGNOSIS — Z79899 Other long term (current) drug therapy: Secondary | ICD-10-CM

## 2023-06-06 DIAGNOSIS — M5417 Radiculopathy, lumbosacral region: Secondary | ICD-10-CM | POA: Diagnosis present

## 2023-06-06 DIAGNOSIS — E1149 Type 2 diabetes mellitus with other diabetic neurological complication: Secondary | ICD-10-CM | POA: Diagnosis present

## 2023-06-06 DIAGNOSIS — E1142 Type 2 diabetes mellitus with diabetic polyneuropathy: Secondary | ICD-10-CM | POA: Diagnosis present

## 2023-06-06 DIAGNOSIS — E1165 Type 2 diabetes mellitus with hyperglycemia: Secondary | ICD-10-CM | POA: Diagnosis not present

## 2023-06-06 DIAGNOSIS — Z905 Acquired absence of kidney: Secondary | ICD-10-CM

## 2023-06-06 DIAGNOSIS — G4733 Obstructive sleep apnea (adult) (pediatric): Secondary | ICD-10-CM | POA: Diagnosis not present

## 2023-06-06 DIAGNOSIS — E1122 Type 2 diabetes mellitus with diabetic chronic kidney disease: Secondary | ICD-10-CM | POA: Diagnosis present

## 2023-06-06 DIAGNOSIS — Z6835 Body mass index (BMI) 35.0-35.9, adult: Secondary | ICD-10-CM

## 2023-06-06 DIAGNOSIS — M545 Low back pain, unspecified: Secondary | ICD-10-CM

## 2023-06-06 DIAGNOSIS — Z833 Family history of diabetes mellitus: Secondary | ICD-10-CM

## 2023-06-06 DIAGNOSIS — I129 Hypertensive chronic kidney disease with stage 1 through stage 4 chronic kidney disease, or unspecified chronic kidney disease: Secondary | ICD-10-CM | POA: Diagnosis present

## 2023-06-06 DIAGNOSIS — Z91048 Other nonmedicinal substance allergy status: Secondary | ICD-10-CM

## 2023-06-06 DIAGNOSIS — M47816 Spondylosis without myelopathy or radiculopathy, lumbar region: Secondary | ICD-10-CM | POA: Diagnosis not present

## 2023-06-06 DIAGNOSIS — Z85528 Personal history of other malignant neoplasm of kidney: Secondary | ICD-10-CM

## 2023-06-06 DIAGNOSIS — R918 Other nonspecific abnormal finding of lung field: Secondary | ICD-10-CM | POA: Diagnosis present

## 2023-06-06 DIAGNOSIS — I728 Aneurysm of other specified arteries: Secondary | ICD-10-CM | POA: Diagnosis present

## 2023-06-06 DIAGNOSIS — Z794 Long term (current) use of insulin: Secondary | ICD-10-CM

## 2023-06-06 DIAGNOSIS — F40240 Claustrophobia: Secondary | ICD-10-CM | POA: Diagnosis present

## 2023-06-06 DIAGNOSIS — M47817 Spondylosis without myelopathy or radiculopathy, lumbosacral region: Secondary | ICD-10-CM | POA: Diagnosis not present

## 2023-06-06 DIAGNOSIS — G4700 Insomnia, unspecified: Secondary | ICD-10-CM | POA: Diagnosis present

## 2023-06-06 DIAGNOSIS — K580 Irritable bowel syndrome with diarrhea: Secondary | ICD-10-CM | POA: Diagnosis present

## 2023-06-06 DIAGNOSIS — R911 Solitary pulmonary nodule: Secondary | ICD-10-CM | POA: Diagnosis not present

## 2023-06-06 DIAGNOSIS — E7800 Pure hypercholesterolemia, unspecified: Secondary | ICD-10-CM | POA: Diagnosis present

## 2023-06-06 DIAGNOSIS — Z832 Family history of diseases of the blood and blood-forming organs and certain disorders involving the immune mechanism: Secondary | ICD-10-CM

## 2023-06-06 DIAGNOSIS — D6959 Other secondary thrombocytopenia: Secondary | ICD-10-CM | POA: Diagnosis present

## 2023-06-06 DIAGNOSIS — M5459 Other low back pain: Secondary | ICD-10-CM | POA: Diagnosis not present

## 2023-06-06 DIAGNOSIS — N3000 Acute cystitis without hematuria: Secondary | ICD-10-CM | POA: Diagnosis present

## 2023-06-06 DIAGNOSIS — E669 Obesity, unspecified: Secondary | ICD-10-CM | POA: Diagnosis present

## 2023-06-06 DIAGNOSIS — Z881 Allergy status to other antibiotic agents status: Secondary | ICD-10-CM

## 2023-06-06 DIAGNOSIS — M48061 Spinal stenosis, lumbar region without neurogenic claudication: Secondary | ICD-10-CM | POA: Diagnosis not present

## 2023-06-06 DIAGNOSIS — Z9181 History of falling: Secondary | ICD-10-CM

## 2023-06-06 DIAGNOSIS — K7460 Unspecified cirrhosis of liver: Secondary | ICD-10-CM | POA: Diagnosis present

## 2023-06-06 DIAGNOSIS — K754 Autoimmune hepatitis: Secondary | ICD-10-CM | POA: Diagnosis not present

## 2023-06-06 DIAGNOSIS — K219 Gastro-esophageal reflux disease without esophagitis: Secondary | ICD-10-CM | POA: Diagnosis present

## 2023-06-06 DIAGNOSIS — Z825 Family history of asthma and other chronic lower respiratory diseases: Secondary | ICD-10-CM

## 2023-06-06 DIAGNOSIS — M5416 Radiculopathy, lumbar region: Secondary | ICD-10-CM | POA: Diagnosis not present

## 2023-06-06 DIAGNOSIS — R001 Bradycardia, unspecified: Secondary | ICD-10-CM | POA: Diagnosis present

## 2023-06-06 DIAGNOSIS — G8929 Other chronic pain: Secondary | ICD-10-CM | POA: Diagnosis present

## 2023-06-06 DIAGNOSIS — M25551 Pain in right hip: Secondary | ICD-10-CM | POA: Diagnosis not present

## 2023-06-06 DIAGNOSIS — R52 Pain, unspecified: Principal | ICD-10-CM

## 2023-06-06 LAB — BASIC METABOLIC PANEL
Anion gap: 8 (ref 5–15)
Anion gap: 7 (ref 5–15)
BUN: 33 mg/dL — ABNORMAL HIGH (ref 8–23)
BUN: 30 mg/dL — ABNORMAL HIGH (ref 8–23)
CO2: 24 mmol/L (ref 22–32)
CO2: 24 mmol/L (ref 22–32)
Calcium: 9.6 mg/dL (ref 8.9–10.3)
Calcium: 9.3 mg/dL (ref 8.9–10.3)
Chloride: 111 mmol/L (ref 98–111)
Chloride: 112 mmol/L — ABNORMAL HIGH (ref 98–111)
Creatinine, Ser: 1.44 mg/dL — ABNORMAL HIGH (ref 0.44–1.00)
Creatinine, Ser: 1.3 mg/dL — ABNORMAL HIGH (ref 0.44–1.00)
GFR, Estimated: 37 mL/min — ABNORMAL LOW (ref >60)
GFR, Estimated: 42 mL/min — ABNORMAL LOW (ref >60)
Glucose, Bld: 139 mg/dL — ABNORMAL HIGH (ref 70–99)
Glucose, Bld: 125 mg/dL — ABNORMAL HIGH (ref 70–99)
Potassium: 3.8 mmol/L (ref 3.5–5.1)
Potassium: 3.9 mmol/L (ref 3.5–5.1)
Sodium: 143 mmol/L (ref 135–145)
Sodium: 143 mmol/L (ref 135–145)

## 2023-06-06 LAB — CBC
HCT: 39.6 % (ref 36.0–46.0)
Hemoglobin: 13.1 g/dL (ref 12.0–15.0)
MCH: 31.4 pg (ref 26.0–34.0)
MCHC: 33.1 g/dL (ref 30.0–36.0)
MCV: 95 fL (ref 80.0–100.0)
Platelets: 91 10*3/uL — ABNORMAL LOW (ref 150–400)
RBC: 4.17 MIL/uL (ref 3.87–5.11)
RDW: 13.5 % (ref 11.5–15.5)
WBC: 6.9 10*3/uL (ref 4.0–10.5)
nRBC: 0 % (ref 0.0–0.2)

## 2023-06-06 LAB — CBC WITH DIFFERENTIAL/PLATELET
Abs Immature Granulocytes: 0.05 10*3/uL (ref 0.00–0.07)
Basophils Absolute: 0 10*3/uL (ref 0.0–0.1)
Basophils Relative: 0 %
Eosinophils Absolute: 0.1 10*3/uL (ref 0.0–0.5)
Eosinophils Relative: 1 %
HCT: 39.5 % (ref 36.0–46.0)
Hemoglobin: 13.3 g/dL (ref 12.0–15.0)
Immature Granulocytes: 1 %
Lymphocytes Relative: 20 %
Lymphs Abs: 1.5 10*3/uL (ref 0.7–4.0)
MCH: 31.7 pg (ref 26.0–34.0)
MCHC: 33.7 g/dL (ref 30.0–36.0)
MCV: 94 fL (ref 80.0–100.0)
Monocytes Absolute: 0.7 10*3/uL (ref 0.1–1.0)
Monocytes Relative: 9 %
Neutro Abs: 5.5 10*3/uL (ref 1.7–7.7)
Neutrophils Relative %: 69 %
Platelets: 96 10*3/uL — ABNORMAL LOW (ref 150–400)
RBC: 4.2 MIL/uL (ref 3.87–5.11)
RDW: 13.3 % (ref 11.5–15.5)
WBC: 7.8 10*3/uL (ref 4.0–10.5)
nRBC: 0 % (ref 0.0–0.2)

## 2023-06-06 LAB — GLUCOSE, CAPILLARY
Glucose-Capillary: 130 mg/dL — ABNORMAL HIGH (ref 70–99)
Glucose-Capillary: 200 mg/dL — ABNORMAL HIGH (ref 70–99)
Glucose-Capillary: 211 mg/dL — ABNORMAL HIGH (ref 70–99)
Glucose-Capillary: 291 mg/dL — ABNORMAL HIGH (ref 70–99)

## 2023-06-06 MED ORDER — METHOCARBAMOL 500 MG PO TABS
500.0000 mg | ORAL_TABLET | Freq: Four times a day (QID) | ORAL | Status: DC | PRN
  Administered 2023-06-06: 500 mg via ORAL
  Filled 2023-06-06 (×3): qty 1

## 2023-06-06 MED ORDER — HYDROMORPHONE HCL 1 MG/ML IJ SOLN
0.5000 mg | INTRAMUSCULAR | Status: DC | PRN
  Administered 2023-06-06 (×2): 0.5 mg via INTRAVENOUS
  Filled 2023-06-06 (×2): qty 0.5

## 2023-06-06 MED ORDER — PREDNISONE 10 MG PO TABS
10.0000 mg | ORAL_TABLET | Freq: Every day | ORAL | Status: DC
Start: 2023-06-07 — End: 2023-06-09
  Administered 2023-06-07 – 2023-06-09 (×3): 10 mg via ORAL
  Filled 2023-06-06 (×3): qty 1

## 2023-06-06 MED ORDER — PANTOPRAZOLE SODIUM 40 MG PO TBEC
40.0000 mg | DELAYED_RELEASE_TABLET | Freq: Every day | ORAL | Status: DC
  Administered 2023-06-06 – 2023-06-09 (×4): 40 mg via ORAL
  Filled 2023-06-06: qty 2
  Filled 2023-06-06 (×3): qty 1

## 2023-06-06 MED ORDER — PREGABALIN 25 MG PO CAPS
25.0000 mg | ORAL_CAPSULE | Freq: Three times a day (TID) | ORAL | Status: DC
  Administered 2023-06-06 – 2023-06-07 (×2): 25 mg via ORAL
  Filled 2023-06-06 (×2): qty 1

## 2023-06-06 MED ORDER — ONDANSETRON HCL 4 MG/2ML IJ SOLN
4.0000 mg | Freq: Four times a day (QID) | INTRAMUSCULAR | Status: DC | PRN
  Administered 2023-06-06: 4 mg via INTRAVENOUS
  Filled 2023-06-06: qty 2

## 2023-06-06 MED ORDER — AZATHIOPRINE 50 MG PO TABS: 50.0000 mg | ORAL_TABLET | Freq: Two times a day (BID) | ORAL | Status: DC

## 2023-06-06 MED ORDER — ACETAMINOPHEN 500 MG PO TABS
500.0000 mg | ORAL_TABLET | Freq: Four times a day (QID) | ORAL | Status: DC
  Filled 2023-06-06: qty 1

## 2023-06-06 MED ORDER — MORPHINE SULFATE ER 15 MG PO TBCR
15.0000 mg | EXTENDED_RELEASE_TABLET | Freq: Two times a day (BID) | ORAL | Status: DC
  Administered 2023-06-06 – 2023-06-07 (×2): 15 mg via ORAL
  Filled 2023-06-06 (×2): qty 1

## 2023-06-06 MED ORDER — LORAZEPAM 2 MG/ML IJ SOLN: 1.0000 mg | Freq: Once | INTRAMUSCULAR | Status: DC | PRN

## 2023-06-06 MED ORDER — INSULIN ASPART 100 UNIT/ML IJ SOLN: 0.0000 [IU] | Freq: Every day | INTRAMUSCULAR | Status: DC

## 2023-06-06 MED ORDER — MORPHINE SULFATE (PF) 2 MG/ML IV SOLN
2.0000 mg | Freq: Once | INTRAVENOUS | Status: AC
  Administered 2023-06-06: 2 mg via INTRAVENOUS
  Filled 2023-06-06: qty 1

## 2023-06-06 MED ORDER — ONDANSETRON HCL 4 MG PO TABS: 4.0000 mg | ORAL_TABLET | Freq: Four times a day (QID) | ORAL | Status: DC | PRN

## 2023-06-06 MED ORDER — LORATADINE 10 MG PO TABS
10.0000 mg | ORAL_TABLET | Freq: Every day | ORAL | Status: DC
  Administered 2023-06-07 – 2023-06-09 (×3): 10 mg via ORAL
  Filled 2023-06-06 (×3): qty 1

## 2023-06-06 MED ORDER — OXYCODONE HCL 5 MG PO TABS: 5.0000 mg | ORAL_TABLET | ORAL | Status: DC | PRN

## 2023-06-06 MED ORDER — INSULIN GLARGINE-YFGN 100 UNIT/ML ~~LOC~~ SOLN
5.0000 [IU] | Freq: Every day | SUBCUTANEOUS | Status: DC
  Administered 2023-06-06 – 2023-06-07 (×2): 5 [IU] via SUBCUTANEOUS
  Filled 2023-06-06 (×3): qty 0.05

## 2023-06-06 MED ORDER — INSULIN ASPART 100 UNIT/ML IJ SOLN
0.0000 [IU] | Freq: Three times a day (TID) | INTRAMUSCULAR | Status: DC
  Administered 2023-06-06: 3 [IU] via SUBCUTANEOUS
  Administered 2023-06-06: 5 [IU] via SUBCUTANEOUS
  Administered 2023-06-07: 8 [IU] via SUBCUTANEOUS
  Administered 2023-06-07: 5 [IU] via SUBCUTANEOUS
  Administered 2023-06-07: 3 [IU] via SUBCUTANEOUS
  Administered 2023-06-08: 5 [IU] via SUBCUTANEOUS
  Administered 2023-06-08: 8 [IU] via SUBCUTANEOUS
  Administered 2023-06-08: 11 [IU] via SUBCUTANEOUS
  Administered 2023-06-09: 2 [IU] via SUBCUTANEOUS
  Administered 2023-06-09: 8 [IU] via SUBCUTANEOUS

## 2023-06-06 MED ORDER — INSULIN PUMP
Freq: Three times a day (TID) | SUBCUTANEOUS | Status: DC
  Filled 2023-06-06: qty 1

## 2023-06-06 MED ORDER — DIAZEPAM 2 MG PO TABS
2.0000 mg | ORAL_TABLET | Freq: Once | ORAL | Status: AC
  Administered 2023-06-06: 2 mg via ORAL
  Filled 2023-06-06 (×2): qty 1

## 2023-06-06 MED ORDER — INSULIN ASPART 100 UNIT/ML IJ SOLN: 0.0000 [IU] | Freq: Three times a day (TID) | INTRAMUSCULAR | Status: DC

## 2023-06-06 MED ORDER — LIDOCAINE 5 % EX PTCH
1.0000 | MEDICATED_PATCH | CUTANEOUS | Status: DC
  Filled 2023-06-06 (×3): qty 1

## 2023-06-06 MED ORDER — POLYETHYLENE GLYCOL 3350 17 G PO PACK: 17.0000 g | PACK | Freq: Every day | ORAL | Status: DC | PRN

## 2023-06-06 MED ORDER — ALBUTEROL SULFATE (2.5 MG/3ML) 0.083% IN NEBU: 2.5000 mg | INHALATION_SOLUTION | Freq: Four times a day (QID) | RESPIRATORY_TRACT | Status: DC | PRN

## 2023-06-06 MED ORDER — DICYCLOMINE HCL 20 MG PO TABS: 20.0000 mg | ORAL_TABLET | Freq: Two times a day (BID) | ORAL | Status: DC | PRN

## 2023-06-06 MED ORDER — ONDANSETRON HCL 4 MG/2ML IJ SOLN
4.0000 mg | Freq: Once | INTRAMUSCULAR | Status: AC
  Administered 2023-06-06: 4 mg via INTRAVENOUS
  Filled 2023-06-06: qty 2

## 2023-06-06 MED ORDER — HYDROCODONE-ACETAMINOPHEN 5-325 MG PO TABS
1.0000 | ORAL_TABLET | ORAL | Status: DC | PRN
Start: 2023-06-06 — End: 2023-06-06
  Filled 2023-06-06: qty 1

## 2023-06-06 MED ORDER — MIRABEGRON ER 50 MG PO TB24
50.0000 mg | ORAL_TABLET | Freq: Every day | ORAL | Status: DC
  Filled 2023-06-06 (×4): qty 1

## 2023-06-06 MED ORDER — FENTANYL CITRATE PF 50 MCG/ML IJ SOSY
50.0000 ug | PREFILLED_SYRINGE | Freq: Once | INTRAMUSCULAR | Status: AC
  Administered 2023-06-06: 50 ug via INTRAVENOUS
  Filled 2023-06-06: qty 1

## 2023-06-06 MED ORDER — INSULIN GLARGINE-YFGN 100 UNIT/ML ~~LOC~~ SOLN: 20.0000 [IU] | Freq: Two times a day (BID) | SUBCUTANEOUS | Status: DC

## 2023-06-06 NOTE — ED Triage Notes (Signed)
PT is BIB by [facility] from home with c/o of chronic back pain. PT was riding in a scooter at [place] today when she started feeling pain.PT states she took 2 Lyrica with no change in condition. PT did not fall and is not on thinners.

## 2023-06-06 NOTE — Plan of Care (Signed)
  Problem: Coping: Goal: Ability to adjust to condition or change in health will improve Outcome: Progressing   Problem: Pain Management: Goal: General experience of comfort will improve Outcome: Progressing

## 2023-06-06 NOTE — ED Provider Notes (Signed)
[place] [facility]ERGENC[facility] DEPARTMENT AT [facility] Provider Note   CSN: [id number] Arrival date & time: 06/06/23  0013     History  Chief Complaint - hip pain, back pain  [name] [name] is a 77 y.o. female.  HPI Patient with extensive history including obesity, diabetes, chronic pain presents with back and right hip pain.  Patient reports a long history of chronic back pain and also pain that radiates into her right hip.  She went to [place] today and rode a scooter started having pain after No falls or trauma.  She is not having difficulty walking due to pain.  The pain starts in her back and radiates into her right leg.  No weakness.  No incontinence.  No previous back or hip surgery.  She has had spinal injections several months ago without any relief.  She was recently transitioned to Lyrica.  She has difficulty tolerating narcotics She reports she was seen recently by neurosurgery who told her there was nothing that could be done   Past Medical History:  Diagnosis Date   Allergic rhinitis, cause unspecified    Anxiety    Arthritis    hands, feet and back   Asthma    Benign neoplasm of colon    Bursitis    hips   CHF (congestive heart failure) (HCC)    Cirrhosis (HCC)    auto immune   Complication of anesthesia    " i WAKE UP WITH TREMORS " after shoulder surgery tremors for 10 months after surgery surgery- 2009- surgical center in [place]   Cyst and pseudocyst of pancreas    Depressive disorder, not elsewhere classified    Diabetes mellitus without complication (HCC)    Diverticulitis    Diverticulosis of colon (without mention of hemorrhage)    Dyspnea    Dysrhythmia    arrhythmia   Esophageal reflux    Essential and other specified forms of tremor    History of GI bleed    History of kidney stones    Irritable bowel syndrome    Obesity, unspecified    Other specified cardiac dysrhythmias(427.89)    Periapical abscess without sinus    Polyneuropathy in  diabetes(357.2)    PONV (postoperative nausea and vomiting)    Pure hypercholesterolemia    Renal cell carcinoma (HCC)    Sleep apnea    CPAP- non compliant   Type II or unspecified type diabetes mellitus with neurological manifestations, not stated as uncontrolled(250.60)     Home Medications Prior to Admission medications   Medication Sig Start Date End Date Taking? Authorizing Provider  albuterol (PROAIR HFA) 108 (90 Base) MCG/ACT inhaler Inhale 1-2 puffs into the lungs every 6 (six) hours as needed for wheezing or shortness of breath. 04/06/20   Parrett, Virgel Bouquet, NP  atenolol (TENORMIN) 25 MG tablet TAKE 1/2 OR 1 TABLET BY MOUTH 2 TIMES DAILY 05/23/23   Runell Gess, MD  azaTHIOprine (IMURAN) 50 MG tablet Take 50 mg by mouth 2 (two) times daily. 07/30/19   [provider]  cetirizine (ZYRTEC) 10 MG tablet Take 10 mg by mouth at bedtime.    [provider]  ciprofloxacin (CIPRO) 500 MG tablet Take 1 tablet (500 mg total) by mouth every 12 (twelve) hours. 04/26/23   Henderly, Britni A, PA-C  Coenzyme Q10 100 MG TABS Take 100 mg by mouth daily.    [provider]  dicyclomine (BENTYL) 20 MG tablet Take 1 tablet (20 mg total) by  mouth 2 (two) times daily. 04/26/23   Henderly, Britni A, PA-C  furosemide (LASIX) 40 MG tablet Take 40 mg by mouth daily.    [provider]  glucose blood test strip OneTouch Ultra Blue Test Strip  TEST 3 TIMES DAILY E11.65    [provider]  halobetasol (ULTRAVATE) 0.05 % cream Apply 1 Application topically 2 (two) times daily as needed (itching).    [provider]  insulin lispro (HUMALOG) 100 UNIT/ML injection Inject into the skin See admin instructions. Via Insulin Pump  NOT currently using Insulin Pump due to a malfunction per pt report on 08/12/22.    [provider]  insulin lispro (HUMALOG) 100 UNIT/ML injection Inject 25 Units into the skin 3 (three) times daily before meals. 25 units at  breakfast, lunch and supper    [provider]  Lactobacillus Rhamnosus, GG, (CULTURELLE PO) Take 1 capsule by mouth daily.    [provider]  MAGNESIUM PO Take 150 mg by mouth 2 (two) times daily.    [provider]  metroNIDAZOLE (FLAGYL) 500 MG tablet Take 1 tablet (500 mg total) by mouth 2 (two) times daily. 04/26/23   Henderly, Britni A, PA-C  mometasone (NASONEX) 50 MCG/ACT nasal spray Place 2 sprays into the nose daily.    [provider]  ondansetron (ZOFRAN-ODT) 4 MG disintegrating tablet Take 1 tablet (4 mg total) by mouth every 8 (eight) hours as needed. 04/26/23   Henderly, Britni A, PA-C  oxyCODONE (ROXICODONE) 5 MG immediate release tablet Take 1 tablet (5 mg total) by mouth every 6 (six) hours as needed for severe pain. 04/26/23   Henderly, Britni A, PA-C  pantoprazole (PROTONIX) 40 MG tablet TAKE 1 TABLET BY MOUTH EVERY DAY 09/17/19   Wendling, Jilda Roche, DO  Polyethyl Glyc-Propyl Glyc PF (SYSTANE HYDRATION PF) 0.4-0.3 % SOLN Place 1 drop into both eyes as needed (dry eyes).    [provider]  predniSONE (DELTASONE) 10 MG tablet Take 10 mg by mouth daily with breakfast.    [provider]  predniSONE (DELTASONE) 50 MG tablet Take 1 tablet 13 hours, 7 hours and 1 hour prior to CT scan injection 06/12/22   Jaci Standard, MD  traMADol (ULTRAM) 50 MG tablet Take 1 tablet (50 mg total) by mouth every 6 (six) hours as needed. 09/12/22   Jaci Standard, MD  Turmeric (QC TUMERIC COMPLEX PO) Take 500 mg by mouth daily. Patient not taking: Reported on 08/08/2022    [provider]  UNABLE TO FIND Take 1 tablet by mouth daily. Med Name: Cory Roughen Vitamin    [provider]  zinc gluconate 50 MG tablet Take 25 mg by mouth in the morning and at bedtime.    [provider]      Allergies    Doxycycline, Epinephrine hcl, Erythromycin ethylsuccinate, Iohexol, Metformin, Oxycodone, and Tape    Review of  Systems   Review of Systems  Constitutional:  Negative for fever.  Gastrointestinal:  Negative for abdominal pain and vomiting.  Genitourinary:  Negative for dysuria.  Musculoskeletal:  Positive for arthralgias and back pain.  Neurological:  Negative for weakness and numbness.    Physical Exam Updated Vital Signs BP (!) 109/40   Pulse (!) 48   Temp 98.5 F (36.9 C) (Oral)   Resp 18   Ht 1.6 m (5\' 3" )   Wt 90.7 kg   SpO2 96%   BMI 35.42 kg/m  Physical Exam  CONSTITUTIONAL: Well developed/well nourished HEAD: Normocephalic/atraumatic EYES: EOMI/PERRL ENMT: Mucous membranes moist NECK: supple no meningeal signs SPINE/BACK:entire spine nontender  Lumbosacral paraspinal tenderness No bruising/crepitance/stepoffs noted to spine CV: S1/S2 noted, no murmurs/rubs/gallops noted LUNGS: Lungs are clear to auscultation bilaterally, no apparent distress ABDOMEN: soft, nontender, no rebound or guarding GU:no cva tenderness NEURO: Awake/alert,equal motor 5/5 strength noted with the following: hip flexion/knee flexion/extension, foot dorsi/plantar flexion, great toe extension intact bilaterally, no clonus bilaterally, plantar reflex appropriate (toes downgoing), no sensory deficit in any dermatome.   EXTREMITIES: pulses normal, full ROM, full range of motion of right hip, no deformities SKIN: warm, color normal PSYCH: no abnormalities of mood noted, alert and oriented to situation  ED Results / Procedures / Treatments   Labs (all labs ordered are listed, but only abnormal results are displayed) Labs Reviewed  CBC WITH DIFFERENTIAL/PLATELET - Abnormal; Notable for the following components:      Result Value   Platelets 96 (*)    All other components within normal limits  BASIC METABOLIC PANEL - Abnormal; Notable for the following components:   Glucose, Bld 139 (*)    BUN 33 (*)    Creatinine, Ser 1.44 (*)    GFR, Estimated 37 (*)    All other components within normal limits     EKG None  Radiology DG HIP UNILAT WITH PELVIS 2-3 VIEWS RIGHT  Result Date: 06/06/2023 CLINICAL DATA:  Pain EXAM: DG HIP (WITH OR WITHOUT PELVIS) 2-3V RIGHT COMPARISON:  None Available. FINDINGS: No fracture or dislocation is seen. Bilateral hip joint spaces are preserved. Visualized bony pelvis appears intact. Mild degenerative changes of the lower lumbar spine. IMPRESSION: Negative. Electronically Signed   By: Charline Bills M.D.   On: 06/06/2023 01:17    Procedures Procedures    Medications Ordered in ED Medications  fentaNYL (SUBLIMAZE) injection 50 mcg (50 mcg Intravenous Given 06/06/23 0123)  ondansetron (ZOFRAN) injection 4 mg (4 mg Intravenous Given 06/06/23 0122)  morphine (PF) 2 MG/ML injection 2 mg (2 mg Intravenous Given 06/06/23 0300)    ED Course/ Medical Decision Making/ A&P Clinical Course as of 06/06/23 0407  Fri Jun 06, 2023  0119 Patient presents with back pain and right hip pain after riding a scooter.  She has had this issue previously.  She has managed by pain management. She does request hip x-ray.  Will treat pain and reassess. [DW]  0200 Patient has not had any pain relief.  She reports she cannot go home like this. This appears to be exacerbation of her underlying chronic back pain.  Previous MRI of her spine was done earlier in the year and reports neurosurgery told her she was not a surgical candidate.  She may benefit from repeat MRI but not on an emergent basis [DW]  0407 Discussed with Dr. Antionette Char for admission.  Patient will be admitted for observation, pain management, may benefit from physical therapy and MRI lumbar spine  Patient without any neurodeficits at this time.  No indication for emergent MRI [DW]    Clinical Course User Index [DW] Zadie Rhine, MD                                 Medical Decision Making Amount and/or Complexity of Data Reviewed Labs: ordered. Radiology: ordered.  Risk Prescription drug  management. Decision regarding hospitalization.   This patient presents to the ED for concern of back and hip pain, this  involves an extensive number of treatment options, and is a complaint that carries with it a high risk of complications and morbidity.  The differential diagnosis includes but is not limited to muscle strain, occult fracture, lumbosacral radiculopathy, epidural abscess, discitis, herniated disc, AAA, pyelonephritis  Comorbidities that complicate the patient evaluation: Patient's presentation is complicated by their history of obesity  Social Determinants of Health: Patient's  history of chronic pain   increases the complexity of managing their presentation  Labs reviewed reveal acute on chronic thrombocytopenia, mild hyperglycemia, renal insufficiency  Additional history obtained: Additional history obtained from family Records reviewed  outpatient records reviewed  Imaging Studies ordered: I ordered imaging studies including X-ray right hip   I independently visualized and interpreted imaging which showed no acute findings I agree with the radiologist interpretation   Medicines ordered and prescription drug management: I ordered medication including fentanyl for pain Reevaluation of the patient after these medicines showed that the patient    stayed the same  Test Considered: I considered MRI lumbar spine, but no acute neurodeficits, this can be deferred to the inpatient team   Consultations Obtained: I requested consultation with the admitting physician triad , and discussed  findings as well as pertinent plan - they recommend: will admit  Reevaluation: After the interventions noted above, I reevaluated the patient and found that they have :stayed the same  Complexity of problems addressed: Patient's presentation is most consistent with  acute presentation with potential threat to life or bodily function  Disposition: After consideration of the  diagnostic results and the patient's response to treatment,  I feel that the patent would benefit from admission   .           Final Clinical Impression(s) / ED Diagnoses Final diagnoses:  Intractable pain  Lumbosacral radiculopathy    Rx / DC Orders ED Discharge Orders     None         Zadie Rhine, MD 06/06/23 [phone]

## 2023-06-06 NOTE — Progress Notes (Signed)
Mobility Specialist: Progress Note   06/06/23 1602  Mobility  Activity Ambulated with assistance in hallway  Level of Assistance Contact guard assist, steadying assist  Assistive Device Front wheel walker  Distance Ambulated (ft) 60 ft  Activity Response Tolerated well  Mobility Referral Yes  $Mobility charge 1 Mobility  Mobility Specialist Start Time (ACUTE ONLY) 1517  Mobility Specialist Stop Time (ACUTE ONLY) 1555  Mobility Specialist Time Calculation (min) (ACUTE ONLY) 38 min    Pt was agreeable to mobility session - received in bed. SV for bed mobility, MinA for STS, CG for ambulation. Had c/o hip pain. Returned to room without fault. Left in bed with all needs met, call bell in reach.   Maurene Capes Mobility Specialist Please contact via SecureChat or Rehab office at [phone]

## 2023-06-06 NOTE — Plan of Care (Signed)
  Problem: Coping: Goal: Ability to adjust to condition or change in health will improve 06/06/2023 1955 by Nona Dell, Almetta Lovely, RN Outcome: Progressing   Problem: Metabolic: Goal: Ability to maintain appropriate glucose levels will improve 06/06/2023 1957 by Tommye Standard, RN Outcome: Progressing   Problem: Activity: Goal: Risk for activity intolerance will decrease 06/06/2023 1957 by Tommye Standard, RN Outcome: Progressing    Problem: Pain Management: Goal: General experience of comfort will improve 06/06/2023 1957 by Tommye Standard, RN Outcome: Progressing

## 2023-06-06 NOTE — Evaluation (Signed)
Occupational Therapy Evaluation Patient Details Name: [name] [name] MRN: [id number] DOB: September 12, 1945 Today's Date: 06/06/2023   History of Present Illness 77 y.o. female presents to [facility] 06/06/23 w/ acute worsening of chronic LBP. PMHx: HTN, DM, autoimmunie hepatitis, cirrhosis, chronic LBP w/ lumbar radiculopathy, CKD, obesity   Clinical Impression   PTA patient independent with mobility using rollator, most ADLs and light IADLs but needing assist for showers (taking basin baths on her own).  Admitted for above and presents with problem list below.  She reports feeling "woozy" throughout session likely from pain medications, but pain is better controlled than earlier today.   She requires min guard for transfers and mobility in room using RW, up to min guard assist for ADLs.  She requires cueing for safety, problem solving.  Would recommend initial support at home for safety with mobiltiy and ADLs, but pt unsure if family can assist.  Based on performance today, would recommend inpatient setting with <3hrs/ day to optimize independence and safety prior to dc home but if patient has increased support would recommend HHOT services.      If plan is discharge home, recommend the following: A little help with walking and/or transfers;A little help with bathing/dressing/bathroom;Assistance with cooking/housework;Assist for transportation;Help with stairs or ramp for entrance    Functional Status Assessment  Patient has had a recent decline in their functional status and demonstrates the ability to make significant improvements in function in a reasonable and predictable amount of time.  Equipment Recommendations  BSC/3in1    Recommendations for Other Services       Precautions / Restrictions Precautions Precautions: Fall Restrictions Weight Bearing Restrictions: No      Mobility Bed Mobility               General bed mobility comments: OOB in recliner    Transfers Overall  transfer level: Needs assistance Equipment used: Rolling walker (2 wheels) Transfers: Sit to/from Stand Sit to Stand: Contact guard assist           General transfer comment: cueing for hand placement and safety, mild unsteadiness in standing and reports feeling 'woozy' from pain medications      Balance Overall balance assessment: Needs assistance Sitting-balance support: No upper extremity supported, Feet supported Sitting balance-Leahy Scale: Good     Standing balance support: Bilateral upper extremity supported, During functional activity, No upper extremity supported Standing balance-Leahy Scale: Poor Standing balance comment: reliant on RW for stability                           ADL either performed or assessed with clinical judgement   ADL Overall ADL's : Needs assistance/impaired     Grooming: Set up;Sitting           Upper Body Dressing : Set up;Sitting   Lower Body Dressing: Contact guard assist;Sit to/from stand   Toilet Transfer: Contact guard assist;Ambulation;Rolling walker (2 wheels)           Functional mobility during ADLs: Contact guard assist;Rolling walker (2 wheels) General ADL Comments: min guard for safety, mild undsteadiness and pt feeling "woozy" from pain medicaitons     Vision   Vision Assessment?: No apparent visual deficits     Perception         Praxis         Pertinent Vitals/Pain Pain Assessment Pain Assessment: Faces Faces Pain Scale: Hurts a little bit Pain Location: R LBP and R LE Pain Descriptors /  Indicators: Aching, Discomfort, Grimacing, Guarding Pain Intervention(s): Limited activity within patient's tolerance, Monitored during session, Repositioned, Premedicated before session     Extremity/Trunk Assessment Upper Extremity Assessment Upper Extremity Assessment: Generalized weakness   Lower Extremity Assessment Lower Extremity Assessment: Defer to PT evaluation   Cervical / Trunk  Assessment Cervical / Trunk Assessment: Normal   Communication Communication Communication: No apparent difficulties Cueing Techniques: Verbal cues   Cognition Arousal: Alert Behavior During Therapy: WFL for tasks assessed/performed Overall Cognitive Status: Impaired/Different from baseline Area of Impairment: Problem solving                             Problem Solving: Slow processing, Requires verbal cues General Comments: pt oriented to month and year, difficulty with date but able to recall after delay.  SHe has some slow processing and decreased probelm solving, reports difficulty with word finding at times at home.     General Comments  limited session as pt reports nausea after short distance mobility in room and requesting to return to chair, RN notified    Exercises     Shoulder Instructions      Home Living Family/patient expects to be discharged to:: Private residence Living Arrangements: Alone Available Help at Discharge: Other (Comment) (no help) Type of Home: House Home Access: Ramped entrance     Home Layout: One level     Bathroom Shower/Tub: Tub/shower unit;Walk-in shower (walk in tub)   Bathroom Toilet: Handicapped height Bathroom Accessibility: Yes   Home Equipment: Grab bars - tub/shower;Grab bars - toilet;Rollator (4 wheels);Wheelchair - manual;Cane - single point   Additional Comments: PCA 1x/week for IADLs and bathing      Prior Functioning/Environment Prior Level of Function : Needs assist       Physical Assist : ADLs (physical)   ADLs (physical): Bathing Mobility Comments: independent w/ rollator ADLs Comments: will bird bath if no assist, shower w/ PCA. Has PCA x1 a week for grocery shopping, cleaning, pt managing her own meals, meds.        OT Problem List: Decreased strength;Decreased activity tolerance;Impaired balance (sitting and/or standing);Pain;Obesity;Decreased knowledge of precautions;Decreased knowledge of use  of DME or AE      OT Treatment/Interventions: Self-care/ADL training;Therapeutic exercise;DME and/or AE instruction;Therapeutic activities;Balance training    OT Goals(Current goals can be found in the care plan section) Acute Rehab OT Goals Patient Stated Goal: get better OT Goal Formulation: With patient Time For Goal Achievement: 06/20/23 Potential to Achieve Goals: Good  OT Frequency: Min 1X/week    Co-evaluation              AM-PAC OT "6 Clicks" Daily Activity     Outcome Measure Help from another person eating meals?: None Help from another person taking care of personal grooming?: A Little Help from another person toileting, which includes using toliet, bedpan, or urinal?: A Little Help from another person bathing (including washing, rinsing, drying)?: A Little Help from another person to put on and taking off regular upper body clothing?: A Little Help from another person to put on and taking off regular lower body clothing?: A Little 6 Click Score: 19   End of Session Equipment Utilized During Treatment: Gait belt;Rolling walker (2 wheels) Nurse Communication: Mobility status;Other (comment) (nausea)  Activity Tolerance: Patient tolerated treatment well (limited by nausea) Patient left: in chair;with call bell/phone within reach;with chair alarm set;with nursing/sitter in room  OT Visit Diagnosis: Other abnormalities of gait and  mobility (R26.89);Muscle weakness (generalized) (M62.81);Pain Pain - Right/Left: Right Pain - part of body:  (hip, back)                Time: 5284-1324 OT Time Calculation (min): 21 min Charges:  OT General Charges $OT Visit: 1 Visit OT Evaluation $OT Eval Moderate Complexity: 1 Mod  [name] [name], OT Acute Rehabilitation Services Office [phone]   Chancy Milroy 06/06/2023, 11:29 AM

## 2023-06-06 NOTE — [place] FL2 (Signed)
[place] MEDICAID FL2 LEVEL OF CARE FORM     IDENTIFICATION  Patient Name: [name] [name] Birthdate: 12/31/1945 Sex: female Admission Date (Current Location): 06/06/2023  [facility] and [place] Number:  Producer, television/film/video and Address:  [facility] [place], [address] [place], [place] [address]      Provider Number: [id number]  Attending Physician Name and Address:  Debarah Crape, DO  Relative Name and Phone Number:  Lowanda Foster Daughter   [phone]    Current Level of Care: Hospital Recommended Level of Care: Skilled Nursing Facility Prior Approval Number:    Date Approved/Denied:   PASRR Number:    Discharge Plan: SNF    Current Diagnoses: Patient Active Problem List   Diagnosis Date Noted   Intractable low back pain 06/06/2023   CKD stage 3a, GFR 45-59 ml/min (HCC) 06/06/2023   Lumbar radiculopathy 06/06/2023   Cirrhosis (HCC)    Renal cell cancer (HCC) 09/12/2022   Renal mass 08/14/2022   Aortic atherosclerosis (HCC) 06/04/2022   Renal mass, left 05/27/2022   Swelling of limb 04/12/2022   Osteopenia 09/21/2021   History of nephrolithiasis 09/21/2021   Sensation of pressure in bladder area 09/21/2021   Anemia 08/17/2019   Hypertension 08/17/2019   Anxiety 08/17/2019   Lower GI bleeding 08/17/2019   Acute lower GI bleeding 08/16/2019   Autoimmune hepatitis (HCC) 12/29/2018   Chest pain 12/22/2018   Asthmatic bronchitis with exacerbation 03/21/2017   Elevated LFTs 12/24/2016   SVT (supraventricular tachycardia) (HCC) 12/24/2016   Peripheral edema 12/24/2016   S/P foot surgery, right 07/27/2015   Preoperative clearance 06/29/2015   Angioedema 08/30/2014   Recurrent UTI 01/06/2014   Chronic insomnia 01/06/2014   Acute bilateral low back pain without sciatica 03/07/2011   Vitamin D deficiency 08/20/2010   TINNITUS, CHRONIC, 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   Lung nodule 08/03/2007   CYST AND PSEUDOCYST OF PANCREAS 08/03/2007    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   TUBULOVILLOUS ADENOMA, COLON 12/30/2006   HYPERCHOLESTEROLEMIA 12/30/2006   OBESITY 12/30/2006   TREMOR, ESSENTIAL 12/30/2006   Palpitations 12/30/2006   Seasonal and perennial allergic rhinitis 12/30/2006   GERD 12/30/2006   DIVERTICULOSIS, COLON 12/30/2006   IBS 12/30/2006   Obstructive sleep apnea 12/30/2006   URINARY INCONTINENCE 12/30/2006   Poorly controlled type 2 diabetes mellitus (HCC) 12/22/2006    Orientation RESPIRATION BLADDER Height & Weight     Self, Time, Situation, Place  Normal Continent Weight: 199 lb 15.3 oz (90.7 kg) Height:  5\' 3"  (160 cm)  BEHAVIORAL SYMPTOMS/MOOD NEUROLOGICAL BOWEL NUTRITION STATUS      Continent Diet (see discharge summary)  AMBULATORY STATUS COMMUNICATION OF NEEDS Skin   Limited Assist Verbally Normal                       Personal Care Assistance Level of Assistance  Bathing, Feeding, Dressing Bathing Assistance: Limited assistance Feeding assistance: Independent Dressing Assistance: Limited assistance     Functional Limitations Info  Sight, Hearing, Speech Sight Info: Adequate Hearing Info: Adequate Speech Info: Adequate    SPECIAL CARE FACTORS FREQUENCY  PT (By licensed PT), OT (By licensed OT)     PT Frequency: 5x week OT Frequency: 5x week            Contractures Contractures Info: Not present    Additional Factors Info  Code Status, Allergies, Insulin Sliding Scale Code Status Info: full Allergies Info: Codeine, Doxycycline, Epinephrine Hcl, Erythromycin Ethylsuccinate, Eucalyptus Oil, Iohexol, Menthol, Metformin, Morphine,  Oxycodone, Tape   Insulin Sliding Scale Info: Novolog: see discharge summary       Current Medications (06/06/2023):  This is the current hospital active medication list Current Facility-Administered Medications  Medication Dose Route Frequency Provider Last Rate Last Admin   acetaminophen (TYLENOL) tablet 500 mg  500 mg Oral Q6H Opyd,  Lavone Neri, MD       albuterol (PROVENTIL) (2.5 MG/3ML) 0.083% nebulizer solution 2.5 mg  2.5 mg Inhalation Q6H PRN Opyd, Lavone Neri, MD       HYDROmorphone (DILAUDID) injection 0.5 mg  0.5 mg Intravenous Q3H PRN Opyd, Lavone Neri, MD   0.5 mg at 06/06/23 0838   insulin aspart (novoLOG) injection 0-15 Units  0-15 Units Subcutaneous TID WC Dezii, Alexandra, DO   3 Units at 06/06/23 1206   insulin glargine-yfgn (SEMGLEE) injection 5 Units  5 Units Subcutaneous QHS Dezii, Alexandra, DO       lidocaine (LIDODERM) 5 % 1 patch  1 patch Transdermal Q24H Dezii, Alexandra, DO       methocarbamol (ROBAXIN) tablet 500 mg  500 mg Oral Q6H PRN Opyd, Lavone Neri, MD       ondansetron (ZOFRAN) tablet 4 mg  4 mg Oral Q6H PRN Opyd, Lavone Neri, MD       Or   ondansetron (ZOFRAN) injection 4 mg  4 mg Intravenous Q6H PRN Opyd, Lavone Neri, MD   4 mg at 06/06/23 1134   oxyCODONE (Oxy IR/ROXICODONE) immediate release tablet 5 mg  5 mg Oral Q4H PRN Opyd, Lavone Neri, MD       pantoprazole (PROTONIX) EC tablet 40 mg  40 mg Oral Daily Opyd, Lavone Neri, MD   40 mg at 06/06/23 5366   polyethylene glycol (MIRALAX / GLYCOLAX) packet 17 g  17 g Oral Daily PRN Opyd, Lavone Neri, MD         Discharge Medications: Please see discharge summary for a list of discharge medications.  Relevant Imaging Results:  Relevant Lab Results:   Additional Information SSN: [id number]  Lorri Frederick, LCSW

## 2023-06-06 NOTE — Progress Notes (Signed)
Patient agreeable to MRI after discussion with MD.  Patient medicated and helped back into bed.  Patient comfortable and awaiting transport.

## 2023-06-06 NOTE — H&P (Signed)
History and Physical    Riverly Hogsed UEA:[id number] DOB: 12/08/1945 DOA: 06/06/2023  PCP: Excell Seltzer, MD   Patient coming from: Home   Chief Complaint: Back pain   HPI: [name] [name] is a 77 y.o. female with medical history significant for hypertension, insulin-dependent diabetes mellitus, autoimmune hepatitis, cirrhosis, chronic low back pain with lumbar radiculopathy, and CKD 3A who presents with acute worsening in her low back pain.   Patient reports that she was in her usual state of health when she was shopping at [facility] today and experienced acute worsening in her chronic low back pain.  She was using a motorized scooter at the time and denies any trauma or inciting event that she can identify.  The pain is localized to the low back on the right side.  There is no lower extremity weakness or numbness, no fever or chills, and no change in bowel habits.  She took 2 Lyrica without relief prior to coming into the ED.  ED Course: Upon arrival to the ED, patient is found to be afebrile and saturating well on room air with low heart rate and systolic blood pressure 111 and greater.  Labs are most notable for creatinine 1.44 and platelets 96,000.  Plain radiographs of the pelvis and right hip are negative for acute findings.  Patient was treated in the ED with fentanyl, morphine, and Zofran.  Review of Systems:  All other systems reviewed and apart from HPI, are negative.  Past Medical History:  Diagnosis Date   Allergic rhinitis, cause unspecified    Anxiety    Arthritis    hands, feet and back   Asthma    Benign neoplasm of colon    Bursitis    hips   CHF (congestive heart failure) (HCC)    Cirrhosis (HCC)    auto immune   Complication of anesthesia    " i WAKE UP WITH TREMORS " after shoulder surgery tremors for 10 months after surgery surgery- 2009- surgical center in [place]   Cyst and pseudocyst of pancreas    Depressive disorder, not elsewhere classified     Diabetes mellitus without complication (HCC)    Diverticulitis    Diverticulosis of colon (without mention of hemorrhage)    Dyspnea    Dysrhythmia    arrhythmia   Esophageal reflux    Essential and other specified forms of tremor    History of GI bleed    History of kidney stones    Irritable bowel syndrome    Obesity, unspecified    Other specified cardiac dysrhythmias(427.89)    Periapical abscess without sinus    Polyneuropathy in diabetes(357.2)    PONV (postoperative nausea and vomiting)    Pure hypercholesterolemia    Renal cell carcinoma (HCC)    Sleep apnea    CPAP- non compliant   Type II or unspecified type diabetes mellitus with neurological manifestations, not stated as uncontrolled(250.60)     Past Surgical History:  Procedure Laterality Date   ABDOMINAL HYSTERECTOMY     CALCANEAL OSTEOTOMY Right 07/27/2015   Procedure: RIGHT CALCANEAL OSTEOTOMY ;  Surgeon: Toni Arthurs, MD;  Location: [facility];  Service: Orthopedics;  Laterality: Right;   CHOLECYSTECTOMY     15-20 years ago   COLONOSCOPY WITH PROPOFOL N/A 08/17/2019   Procedure: COLONOSCOPY WITH PROPOFOL;  Surgeon: Jeani Hawking, MD;  Location: [facility] END[facility]COPY;  Service: Endoscopy;  Laterality: N/A;   COLONOSCOPY WITH PROPOFOL N/A 06/21/2022   Procedure: COLONOSCOPY WITH PROPOFOL;  Surgeon: Jeani Hawking, MD;  Location: [facility];  Service: Gastroenterology;  Laterality: N/A;   ESOPHAGOGASTRODUODENOSCOPY (EGD) WITH PROPOFOL N/A 08/17/2019   Procedure: ESOPHAGOGASTRODUODENOSCOPY (EGD) WITH PROPOFOL;  Surgeon: Jeani Hawking, MD;  Location: [facility] END[facility]COPY;  Service: Endoscopy;  Laterality: N/A;   GASTROCNEMIUS RECESSION Right 07/27/2015   Procedure: RIGHT GASTROC RECESSION;  Surgeon: Toni Arthurs, MD;  Location: [facility];  Service: Orthopedics;  Laterality: Right;   KIDNEY STONE SURGERY  1980's   removal   LITHOTRIPSY     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   PARTIAL HYSTERECTOMY  1990's   POLYPECTOMY   08/17/2019   Procedure: POLYPECTOMY;  Surgeon: Jeani Hawking, MD;  Location: [facility] ENDOSCOPY;  Service: Endoscopy;;   POLYPECTOMY  06/21/2022   Procedure: POLYPECTOMY;  Surgeon: Jeani Hawking, MD;  Location: [facility];  Service: Gastroenterology;;   ROBOT ASSISTED LAPAROSCOPIC NEPHRECTOMY Left 08/14/2022   Procedure: XI ROBOTIC ASSISTED LAPAROSCOPIC NEPHRECTOMY;  Surgeon: Rene Paci, MD;  Location: [facility];  Service: Urology;  Laterality: Left;   ROTATOR CUFF REPAIR  2005   XI ROBOTIC LAPAROSCOPIC ASSISTED APPENDECTOMY N/A 08/14/2022   Procedure: XI ROBOTIC LAPAROSCOPIC ASSISTED APPENDECTOMY;  Surgeon: Romie Levee, MD;  Location: [facility];  Service: General;  Laterality: N/A;    Social History:   reports that she has never smoked. She has never used smokeless tobacco. She reports that she does not drink alcohol and does not use drugs.  Allergies  Allergen Reactions   Doxycycline Hives   Epinephrine Hcl Other (See Comments)    Had tremors when it was given.   Erythromycin Ethylsuccinate Other (See Comments)    REACTION: abd spasms   Iohexol Other (See Comments)     Code: HIVES, Desc: pt needs premedicated-- hives on prev contrast study per md office    Metformin Diarrhea   Oxycodone Other (See Comments)    "felt like I was dying"   Tape Rash    Family History  Problem Relation Age of Onset   Clotting disorder Mother    Cirrhosis Father    Diabetes Other        2 aunts   Cancer Other        grandmother   Allergies Other        "whole family"   Asthma Other        "whole family"     Prior to Admission medications   Medication Sig Start Date End Date Taking? Authorizing Provider  albuterol (PROAIR HFA) 108 (90 Base) MCG/ACT inhaler Inhale 1-2 puffs into the lungs every 6 (six) hours as needed for wheezing or shortness of breath. 04/06/20   Parrett, Virgel Bouquet, NP  atenolol (TENORMIN) 25 MG tablet TAKE 1/2 OR 1 TABLET BY MOUTH 2 TIMES DAILY 05/23/23   Runell Gess, MD  azaTHIOprine (IMURAN) 50 MG tablet Take 50 mg by mouth 2 (two) times daily. 07/30/19   [provider]  cetirizine (ZYRTEC) 10 MG tablet Take 10 mg by mouth at bedtime.    [provider]  ciprofloxacin (CIPRO) 500 MG tablet Take 1 tablet (500 mg total) by mouth every 12 (twelve) hours. 04/26/23   Henderly, Britni A, PA-C  Coenzyme Q10 100 MG TABS Take 100 mg by mouth daily.    [provider]  dicyclomine (BENTYL) 20 MG tablet Take 1 tablet (20 mg total) by mouth 2 (two) times daily. 04/26/23   Henderly, Britni A, PA-C  furosemide (LASIX) 40 MG tablet Take 40 mg by mouth daily.  [provider]  glucose blood test strip OneTouch Ultra Blue Test Strip  TEST 3 TIMES DAILY E11.65    [provider]  halobetasol (ULTRAVATE) 0.05 % cream Apply 1 Application topically 2 (two) times daily as needed (itching).    [provider]  insulin lispro (HUMALOG) 100 UNIT/ML injection Inject into the skin See admin instructions. Via Insulin Pump  NOT currently using Insulin Pump due to a malfunction per pt report on 08/12/22.    [provider]  insulin lispro (HUMALOG) 100 UNIT/ML injection Inject 25 Units into the skin 3 (three) times daily before meals. 25 units at breakfast, lunch and supper    [provider]  Lactobacillus Rhamnosus, GG, (CULTURELLE PO) Take 1 capsule by mouth daily.    [provider]  MAGNESIUM PO Take 150 mg by mouth 2 (two) times daily.    [provider]  metroNIDAZOLE (FLAGYL) 500 MG tablet Take 1 tablet (500 mg total) by mouth 2 (two) times daily. 04/26/23   Henderly, Britni A, PA-C  mometasone (NASONEX) 50 MCG/ACT nasal spray Place 2 sprays into the nose daily.    [provider]  ondansetron (ZOFRAN-ODT) 4 MG disintegrating tablet Take 1 tablet (4 mg total) by mouth every 8 (eight) hours as needed. 04/26/23   Henderly, Britni A, PA-C  oxyCODONE (ROXICODONE) 5 MG immediate release  tablet Take 1 tablet (5 mg total) by mouth every 6 (six) hours as needed for severe pain. 04/26/23   Henderly, Britni A, PA-C  pantoprazole (PROTONIX) 40 MG tablet TAKE 1 TABLET BY MOUTH EVERY DAY 09/17/19   Wendling, Jilda Roche, DO  Polyethyl Glyc-Propyl Glyc PF (SYSTANE HYDRATION PF) 0.4-0.3 % SOLN Place 1 drop into both eyes as needed (dry eyes).    [provider]  predniSONE (DELTASONE) 10 MG tablet Take 10 mg by mouth daily with breakfast.    [provider]  predniSONE (DELTASONE) 50 MG tablet Take 1 tablet 13 hours, 7 hours and 1 hour prior to CT scan injection 06/12/22   Jaci Standard, MD  traMADol (ULTRAM) 50 MG tablet Take 1 tablet (50 mg total) by mouth every 6 (six) hours as needed. 09/12/22   Jaci Standard, MD  Turmeric (QC TUMERIC COMPLEX PO) Take 500 mg by mouth daily. Patient not taking: Reported on 08/08/2022    [provider]  UNABLE TO FIND Take 1 tablet by mouth daily. Med Name: Cory Roughen Vitamin    [provider]  zinc gluconate 50 MG tablet Take 25 mg by mouth in the morning and at bedtime.    [provider]    Physical Exam: Vitals:   06/06/23 0215 06/06/23 0315 06/06/23 0345 06/06/23 0400  BP: 131/72 (!) 111/44 (!) 113/43 (!) 109/40  Pulse: (!) 49 (!) 48 (!) 47 (!) 48  Resp:  18    Temp:      TempSrc:      SpO2: 99% 96% 98% 96%  Weight:      Height:        Constitutional: NAD, no pallor or diaphoresis   Eyes: PERTLA, lids and conjunctivae normal ENMT: Mucous membranes are moist. Posterior pharynx clear of any exudate or lesions.   Neck: supple, no masses  Respiratory: no wheezing, no crackles. No accessory muscle use.  Cardiovascular: S1 & S2 heard, regular rate and rhythm. No JVD. Abdomen: no tenderness, soft. Bowel sounds active.  Musculoskeletal: no clubbing / cyanosis. No joint deformity upper and lower  extremities.   Skin: no significant rashes, lesions, ulcers. Warm, dry, well-perfused. Neurologic:  CN 2-12 grossly intact. Moving all extremities. Alert and oriented.  Psychiatric: Calm. Cooperative.    Labs and Imaging on Admission: I have personally reviewed following labs and imaging studies  CBC: Recent Labs  Lab 06/06/23 0246  WBC 7.8  NEUTROABS 5.5  HGB 13.3  HCT 39.5  MCV 94.0  PLT 96*   Basic Metabolic Panel: Recent Labs  Lab 06/06/23 0246  NA 143  K 3.8  CL 111  CO2 24  GLUCOSE 139*  BUN 33*  CREATININE 1.44*  CALCIUM 9.6   GFR: Estimated Creatinine Clearance: 35 mL/min (A) (by C-G formula based on SCr of 1.44 mg/dL (H)). Liver Function Tests: No results for input(s): "AST", "ALT", "ALKPHOS", "BILITOT", "PROT", "ALBUMIN" in the last 168 hours. No results for input(s): "LIPASE", "AMYLASE" in the last 168 hours. No results for input(s): "AMMONIA" in the last 168 hours. Coagulation Profile: No results for input(s): "INR", "PROTIME" in the last 168 hours. Cardiac Enzymes: No results for input(s): "CKTOTAL", "CKMB", "CKMBINDEX", "TROPONINI" in the last 168 hours. BNP (last 3 results) No results for input(s): "PROBNP" in the last 8760 hours. HbA1C: No results for input(s): "HGBA1C" in the last 72 hours. CBG: No results for input(s): "GLUCAP" in the last 168 hours. Lipid Profile: No results for input(s): "CHOL", "HDL", "LDLCALC", "TRIG", "CHOLHDL", "LDLDIRECT" in the last 72 hours. Thyroid Function Tests: No results for input(s): "TSH", "T4TOTAL", "FREET4", "T3FREE", "THYROIDAB" in the last 72 hours. Anemia Panel: No results for input(s): "VITAMINB12", "FOLATE", "FERRITIN", "TIBC", "IRON", "RETICCTPCT" in the last 72 hours. Urine analysis:    Component Value Date/Time   COLORURINE YELLOW 04/26/2023 1318   APPEARANCEUR HAZY (A) 04/26/2023 1318   LABSPEC 1.020 04/26/2023 1318   PHURINE 6.0 04/26/2023 1318   GLUCOSEU NEGATIVE 04/26/2023 1318   HGBUR TRACE (A) 04/26/2023 1318   HGBUR moderate 03/28/2010 0931   BILIRUBINUR NEGATIVE 04/26/2023 1318    BILIRUBINUR Negative 09/21/2021 1614   KETONESUR NEGATIVE 04/26/2023 1318   PROTEINUR TRACE (A) 04/26/2023 1318   UROBILINOGEN 0.2 09/21/2021 1614   UROBILINOGEN 0.2 03/28/2010 0931   NITRITE POSITIVE (A) 04/26/2023 1318   LEUKOCYTESUR LARGE (A) 04/26/2023 1318   Sepsis Labs: @LABRCNTIP (procalcitonin:4,lacticidven:4) )No results found for this or any previous visit (from the past 240 hour(s)).   Radiological Exams on Admission: DG HIP UNILAT WITH PELVIS 2-3 VIEWS RIGHT  Result Date: 06/06/2023 CLINICAL DATA:  Pain EXAM: DG HIP (WITH OR WITHOUT PELVIS) 2-3V RIGHT COMPARISON:  None Available. FINDINGS: No fracture or dislocation is seen. Bilateral hip joint spaces are preserved. Visualized bony pelvis appears intact. Mild degenerative changes of the lower lumbar spine. IMPRESSION: Negative. Electronically Signed   By: Charline Bills M.D.   On: 06/06/2023 01:17     Assessment/Plan   1. Acute on chronic low back pain  - Hx of chronic lumbar radiculopathy followed by Dr. Lovell Sheehan of neurosurgery p/w acute atraumatic worsening in pain, requiring IV opiates in ED   - Check MRI lumbar spine, continue pain-control, consult PT    2. Autoimmune hepatitis; cirrhosis  - Appears compensated, continue Imuran, hold Lasix given increase in creatinine and low-normal BP     3. CKD 3A  - SCr is 1.44 on admission, up from 1.19 in October 2024  - Hold Lasix for now, renally-dose medications, monitor   4. Type II DM  - A1c was 7.9% in January 2024  - Continue insulin pump  5. Lung nodules  - Followed by Dr. Leonides Schanz of oncology with plan for repeat CT in December   6. OSA  - CPAP while sleeping    7. Hypertension  - Hold atenolol given HR in 40s and low-normal BP    8. Thrombocytopenia - Platelets 96,000 on admission  - Likely related to her chronic liver disease, will repeat CBC in am    DVT prophylaxis: SCDs  Code Status: Full  Level of Care: Level of care: Med-Surg Family  Communication: Daughter at bedside   Disposition Plan:  Patient is from: home  Anticipated d/c is to: TBD Anticipated d/c date is: 06/07/23  Patient currently: Pending MRI lumbar spine, pain-control, PT eval  Consults called: None  Admission status: Observation     Briscoe Deutscher, MD [facility]  06/06/2023, 4:16 AM

## 2023-06-06 NOTE — Progress Notes (Signed)
RE:  [name] [name]      Date of Birth: 07/27/1945      Date:   06/06/23       To Whom It May Concern:  Please be advised that the above-named patient will require a short-term nursing home stay - anticipated 30 days or less for rehabilitation and strengthening.  The plan is for return home.                 MD signature                Date

## 2023-06-06 NOTE — Progress Notes (Signed)
   06/06/23 1605  Spiritual Encounters  Type of Visit Initial  Care provided to: Patient  Conversation partners present during encounter Nurse  Reason for visit Routine spiritual support  OnCall Visit No   Reason For Visit:    Chaplain visiting Pt at the request of a Spiritual Consult which stated Pt was experiencing "Major Life Transition"  Interventions:     Chaplain cultivated a relationship of care support quickly through use of humor. Chaplain facilitated story-telling and life review through the use of open-ended questions and curious probing. Chaplain provide compassionate presence through the use of empathetic language, reflective listening, and compassionate touch. Chaplain explored Pt's faith tradition and provided the encouragement of sacred writings (Bible) and offered the ritual of Ephriam Knuckles prayer when asked to do so.  Outcomes:     Pt expressed ultimate hope as well as gratitude for a listening ear Pt tearfully processed emotions and progressed toward peace and acceptance of circumstances  Assessment:      Needs Pt continues to process the recent loss of her husband, and at times seems to experience emotional fatigue Resources Pt possesses a strong and practical faith that she employs easily and in most of life situations Pt also appears to have a strong family support system and a good church community behind her. Pt is also quick to laugh and seek to joy and humor in all things. 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  Pt also seems to process her emotions and feelings through her storytelling, so medical staff should try to be patient and listen to her as they can.  Plan:     This chaplain will continue to follow up with this Pt regularly until discharge.   Chaplain services remain available by Spiritual Consult or for emergent cases, paging [phone]  Chaplain Raelene Bott,  MDiv [email or web address] [phone]

## 2023-06-06 NOTE — ED Notes (Signed)
Admitting provider at bedside.

## 2023-06-06 NOTE — Progress Notes (Signed)
PROGRESS NOTE    [name] [name]  ZOX:[id number] DOB: Feb 20, 1946 DOA: 06/06/2023 PCP: Excell Seltzer, MD  Chief Complaint  Patient presents with   Leg Pain    Hospital Course:  [name] [name] is 77 y.o. female with hypertension, insulin-dependent diabetes, autoimmune hepatitis with cirrhosis, chronic low back pain with lumbar radiculopathy ongoing workup with [facility], and CKD 3A who presents with acute worsening of her low back pain.  Patient reports that she was shopping at [employer] in a motorized scooter and denies any trauma or inciting event but family began having severe low back pain on the right side.  Denies any lower extremity weakness, numbness, fever, chills or change in bowel habits.  She took 2 Lyrica without relief prior to coming to the ED.  On arrival patient was found to have hemodynamically stable vitals, and labs largely consistent with her baseline.  She was admitted for MRI lumbar spine, pain control, PT.  Subjective: On evaluation this morning patient's pain has been difficult to control.  She has listed allergies to almost every pain medication and is refusing Tylenol.  Patient is very tearful when discussing her care plan and feels hopeless.  She also reported that she was too claustrophobic to undergo an MRI, refused Ativan and Haldol, accepted provided Valium.  When transport arrived to take patient to MRI with Valium patient again refused.  After extensive counseling and discussion patient was amenable to trialing MRI again.  This is still pending at this time. Regarding her pain control, patient reports that all opioid pain medicines "make her mean" she does not have true allergy to any of these medications.  She also reports that antispasmodics are trash and is refusing to take them.  Similarly she has denied lidocaine patch and heating pads.  She has been on Dilaudid and oxycodone with minimal relief.  She would like to trial morphine.   Objective: Vitals:   06/06/23  0500 06/06/23 0724 06/06/23 0830 06/06/23 1158  BP: 115/79 (!) 138/59 (!) 148/54 (!) 138/57  Pulse: 68 (!) 48 (!) 50 (!) 50  Resp: 20 20 18 20   Temp: 98.2 F (36.8 C) 98.6 F (37 C) 98.6 F (37 C) (!) 97.3 F (36.3 C)  TempSrc: Oral Oral Oral Oral  SpO2: 98% 98% 98% 100%  Weight:      Height:        Intake/Output Summary (Last 24 hours) at 06/06/2023 1649 Last data filed at 06/06/2023 0600 Gross per 24 hour  Intake 240 ml  Output --  Net 240 ml   Filed Weights   06/06/23 0032  Weight: 90.7 kg    Examination: General exam: sitting upright in chair. tearful Respiratory system: No work of breathing, symmetric chest wall expansion Cardiovascular system: S1 & S2 heard, RRR.  Gastrointestinal system: Abdomen is nondistended, soft and nontender.  Neuro: Alert and oriented. No focal neurological deficits. Extremities: Symmetric, expected ROM Skin: No rashes, lesions Psychiatry: Demonstrates appropriate judgement and insight. Tearful.   Assessment & Plan:  Principal Problem:   Intractable low back pain Active Problems:   Poorly controlled type 2 diabetes mellitus (HCC)   HYPERCHOLESTEROLEMIA   Lung nodule   Obstructive sleep apnea   Autoimmune hepatitis (HCC)   Hypertension   Cirrhosis (HCC)   CKD stage 3a, GFR 45-59 ml/min (HCC)   Lumbar radiculopathy  Acute on chronic low back pain History of chronic lumbar radiculopathy followed by Dr. Lovell Sheehan of neurosurgery, reports she has upcoming follow-up appointment with [facility]  at the end of the week and is scheduled for outpatient MRI -MRI this admission reveals slight progression of severe left neural foraminal narrowing at L5-S1, consulted Neurosurgery. Appreciated recommendations -Pain control has been difficult.  Discussed extensively with patient the importance of multimodal pain control.  Will continue with scheduled antispasmodics, Tylenol, lidocaine patches, morphine.  Extensive allergy profile, CKD, and cirrhosis  will make this pain difficult to manage.  For now we will trial p.o. morphine at patient request.  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  Will discontinue during this admission Will hold Lasix for now given increased creatinine and normotensive/low BPs. Continue outpatient follow-up with GI  CKD 3A Appears baseline creatinine close to 1.19 as of October 2024, currently 1.44 on admission Renally dose medications Avoid nephrotoxic's when possible Trend CMP  Type 2 diabetes A1c 7.9% 07/2022 Patient is on insulin pump at home but does not have it with her at this time.  We will continue with sliding scale insulin for now. Initiate basal bolus low-dose and titrate up as tolerated.  Lung nodules Outpt currently following with Dr. Leonides Schanz of oncology Planning for repeat CT next month  OSA CPAP while sleeping  Hypertension On atenolol outpatient.  Will hold at this time given borderline bradycardia and low normal BPs  Thrombocytopenia Platelets 96K on admission This is secondary to her chronic liver disease.  Will trend on CBC.  No acute intervention warranted at this time.  History of renal cell carcinoma Status post nephrectomy Follows with oncology. CKD management as above  BMI 35 Needs outpatient follow-up for weight loss counseling and risk modification.   DVT prophylaxis: SCDs Code Status: Full Family Communication: none at bedside, discussed with patient directly  Disposition:  Status is: Observation The patient remains OBS appropriate and will d/c before 2 midnights.    Consultants:  Neurosurgery  Procedures:  N/a  Antimicrobials:  Anti-infectives (From admission, onward)    None       Data Reviewed: I have personally reviewed following labs and imaging studies CBC: Recent Labs  Lab  06/06/23 0246 06/06/23 0442  WBC 7.8 6.9  NEUTROABS 5.5  --   HGB 13.3 13.1  HCT 39.5 39.6  MCV 94.0 95.0  PLT 96* 91*   Basic Metabolic Panel: Recent Labs  Lab 06/06/23 0246 06/06/23 0442  NA 143 143  K 3.8 3.9  CL 111 112*  CO2 24 24  GLUCOSE 139* 125*  BUN 33* 30*  CREATININE 1.44* 1.30*  CALCIUM 9.6 9.3   GFR: Estimated Creatinine Clearance: 38.7 mL/min (A) (by C-G formula based on SCr of 1.3 mg/dL (H)). Liver Function Tests: No results for input(s): "AST", "ALT", "ALKPHOS", "BILITOT", "PROT", "ALBUMIN" in the last 168 hours. CBG: Recent Labs  Lab 06/06/23 0737 06/06/23 1115 06/06/23 1639  GLUCAP 130* 200* 211*    No results found for this or any previous visit (from the past 240 hour(s)).   Radiology Studies: MR LUMBAR SPINE WO CONTRAST  Result Date: 06/06/2023 CLINICAL DATA:  Low back pain, symptoms persist with > 6 wks treatment EXAM: MRI LUMBAR SPINE WITHOUT CONTRAST TECHNIQUE: Multiplanar, multisequence MR imaging of the lumbar spine was performed. No intravenous contrast was administered. COMPARISON:  Lumbar spine MRI 05/24/2022 FINDINGS: Segmentation:  Standard. Alignment:  Physiologic. Vertebrae:  No fracture, evidence of discitis, or bone lesion. Conus medullaris and cauda equina: Conus extends  to the L1 level. Conus and cauda equina appear normal. Paraspinal and other soft tissues: Status post left-sided nephrectomy. There is a small splenule adjacent to the spleen (series 5, image 6). There is likely also a 1.3 x 0.9 cm splenic artery aneurysm (series 5, image 5). 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 No spinal canal or neural foraminal stenosis. L1-L2: Mild bilateral facet degenerative change. Minimal disc bulge. No spinal canal narrowing. No neural foraminal narrowing. L2-L3: Moderate bilateral facet degenerative change. Short pedicles. Circumferential disc bulge. Moderate spinal  canal narrowing. Mild bilateral neural foraminal narrowing, right-greater-than-left. Findings are unchanged from prior exam L3-L4: Moderate bilateral facet degenerative change. Ligamentum flavum hypertrophy. Circumferential disc bulge. Short pedicles. Severe spinal canal narrowing. Moderate bilateral neural foraminal narrowing. Findings are unchanged from prior exam. L4-L5: Moderate bilateral facet degenerative change. Short pedicles. Circumferential disc bulge. Moderate spinal canal narrowing. Severe left and moderate right neural foraminal narrowing. Findings are unchanged from prior exam L5-S1: Moderate bilateral facet degenerative change. Circumferential disc bulge. Mild spinal canal narrowing. Severe left and moderate right neural foraminal narrowing. Findings have slightly progressed on the left. IMPRESSION: 1. Slight progression of severe left neural foraminal narrowing at L5-S1. 2. Otherwise, no significant interval change in multilevel degenerative changes of the lumbar spine, as described above. 3. Likely 1.3 x 0.9 cm splenic artery aneurysm. Recommend further evaluation with a dedicated CTA of the abdomen. Electronically Signed   By: Lorenza Cambridge M.D.   On: 06/06/2023 14:39   DG HIP UNILAT WITH PELVIS 2-3 VIEWS RIGHT  Result Date: 06/06/2023 CLINICAL DATA:  Pain EXAM: DG HIP (WITH OR WITHOUT PELVIS) 2-3V RIGHT COMPARISON:  None Available. FINDINGS: No fracture or dislocation is seen. Bilateral hip joint spaces are preserved. Visualized bony pelvis appears intact. Mild degenerative changes of the lower lumbar spine. IMPRESSION: Negative. Electronically Signed   By: Charline Bills M.D.   On: 06/06/2023 01:17    Scheduled Meds:  acetaminophen  500 mg Oral Q6H   insulin aspart  0-15 Units Subcutaneous TID WC   insulin glargine-yfgn  5 Units Subcutaneous QHS   lidocaine  1 patch Transdermal Q24H   morphine  15 mg Oral Q12H   pantoprazole  40 mg Oral Daily   Continuous Infusions:   LOS: 0  days    Time spent:   Debarah Crape, DO [facility]  To contact the attending physician between 7A-7P please use Epic Chat. To contact the covering physician during after hours 7P-7A, please review Amion.   06/06/2023, 4:49 PM

## 2023-06-06 NOTE — Evaluation (Signed)
Physical Therapy Evaluation Patient Details Name: [name] [name] MRN: [id number] DOB: 08/31/1945 Today's Date: 06/06/2023  History of Present Illness  77 y.o. female presents to [facility] 06/06/23 w/ acute worsening of chronic LBP. PMHx: HTN, DM, autoimmunie hepatitis, cirrhosis, chronic LBP w/ lumbar radiculopathy, CKD, obesity   Clinical Impression  Pt in bed upon arrival and agreeable to PT eval. Pt reports chronic LBP, however, pt has been able to ambulate with RW with modI. Pt reports having difficulty with balance and does not shower without someone there to assist. Pt reports not having family who could come stay with her and offer physical assistance. Pt currently requires MinA for bed mobility and to stand with RW. Pt has R>L LE pain when ambulating, requiring CGA for short distances due to instability. Pt presents with decreased LE strength, balance, difficulty with mobilizing, and decreased activity tolerance. With no family support at home, pt would benefit from <3 hrs post acute rehab to work towards independence with mobility. Pt is going to call family to see if someone could stay with her after the hospital stay. Acute PT to follow.          If plan is discharge home, recommend the following: A little help with walking and/or transfers;A little help with bathing/dressing/bathroom;Assist for transportation   Can travel by private vehicle   No (Pt does not have family who can transport her)    Equipment Recommendations None recommended by PT     Functional Status Assessment Patient has had a recent decline in their functional status and demonstrates the ability to make significant improvements in function in a reasonable and predictable amount of time.     Precautions / Restrictions Precautions Precautions: Fall Restrictions Weight Bearing Restrictions: No      Mobility  Bed Mobility Overal bed mobility: Needs Assistance Bed Mobility: Supine to Sit, Sit to Supine      Supine to sit: Min assist, HOB elevated Sit to supine: HOB elevated, Contact guard assist   General bed mobility comments: MinA for completion of trunk elevation. HOB elevated for bed mobility w/ use of rails. Increased time and effort. Pt prefers to not roll, swings LE off EOB and then pushes to sit.    Transfers Overall transfer level: Needs assistance Equipment used: Rolling walker (2 wheels) Transfers: Sit to/from Stand Sit to Stand: Min assist           General transfer comment: MinA for initial rise and boost. Slightly unsteady    Ambulation/Gait Ambulation/Gait assistance: Contact guard assist Gait Distance (Feet): 16 Feet Assistive device: Rolling walker (2 wheels) Gait Pattern/deviations: Step-to pattern, Decreased step length - left, Decreased stance time - left Gait velocity: dec     General Gait Details: Pain w/ WB on R LE, shortened step on left to compensate. Slightly unsteady, requiring CGA. Increased time and effort         Balance Overall balance assessment: Needs assistance Sitting-balance support: No upper extremity supported, Feet supported Sitting balance-Leahy Scale: Good     Standing balance support: Bilateral upper extremity supported, During functional activity, Reliant on assistive device for balance Standing balance-Leahy Scale: Poor Standing balance comment: reliant on RW for stability            Pertinent Vitals/Pain Pain Assessment Pain Assessment: Faces Faces Pain Scale: Hurts whole lot Pain Location: R LBP and R LE Pain Descriptors / Indicators: Aching, Discomfort, Grimacing, Guarding Pain Intervention(s): Limited activity within patient's tolerance, Monitored during session, RN gave pain meds  during session, Repositioned    Home Living Family/patient expects to be discharged to:: Private residence Living Arrangements: Alone Available Help at Discharge: Other (Comment) (no help) Type of Home: House Home Access: Ramped  entrance       Home Layout: One level Home Equipment: Grab bars - tub/shower;Grab bars - toilet;Rollator (4 wheels);Wheelchair - manual;Cane - single point Additional Comments: Has PCA x1 a week    Prior Function Prior Level of Function : Needs assist       Physical Assist : ADLs (physical)   ADLs (physical): Bathing Mobility Comments: independent w/ RW ADLs Comments: will bird bath if no assist, shower w/ PCA. Has PCA x1 a week for grocery shopping, cleaning     Extremity/Trunk Assessment   Upper Extremity Assessment Upper Extremity Assessment: Defer to OT evaluation    Lower Extremity Assessment Lower Extremity Assessment: Generalized weakness (R>L pain w/ MMT, grossly 4/5)    Cervical / Trunk Assessment Cervical / Trunk Assessment: Normal  Communication   Communication Communication: No apparent difficulties Cueing Techniques: Verbal cues  Cognition Arousal: Alert Behavior During Therapy: WFL for tasks assessed/performed Overall Cognitive Status: Within Functional Limits for tasks assessed       General Comments General comments (skin integrity, edema, etc.): VSS on RA     PT Assessment Patient needs continued PT services  PT Problem List Decreased strength;Decreased activity tolerance;Decreased balance;Decreased mobility;Decreased knowledge of use of DME       PT Treatment Interventions DME instruction;Gait training;Functional mobility training;Therapeutic activities;Balance training;Therapeutic exercise;Neuromuscular re-education;Patient/family education    PT Goals (Current goals can be found in the Care Plan section)  Acute Rehab PT Goals Patient Stated Goal: to get better PT Goal Formulation: With patient Time For Goal Achievement: 06/20/23 Potential to Achieve Goals: Good    Frequency Min 1X/week        AM-PAC PT "6 Clicks" Mobility  Outcome Measure Help needed turning from your back to your side while in a flat bed without using bedrails?: A  Little Help needed moving from lying on your back to sitting on the side of a flat bed without using bedrails?: A Little Help needed moving to and from a bed to a chair (including a wheelchair)?: A Little Help needed standing up from a chair using your arms (e.g., wheelchair or bedside chair)?: A Little Help needed to walk in hospital room?: A Little Help needed climbing 3-5 steps with a railing? : A Lot 6 Click Score: 17    End of Session Equipment Utilized During Treatment: Gait belt Activity Tolerance: Patient limited by pain Patient left: in bed;with call bell/phone within reach;with family/visitor present Nurse Communication: Mobility status PT Visit Diagnosis: Unsteadiness on feet (R26.81);Muscle weakness (generalized) (M62.81)    Time: 4696-2952 PT Time Calculation (min) (ACUTE ONLY): 45 min   Charges:   PT Evaluation $PT Eval Low Complexity: 1 Low PT Treatments $Therapeutic Activity: 8-22 mins PT General Charges $$ ACUTE PT VISIT: 1 Visit         Hilton Cork, PT, DPT Secure Chat Preferred  Rehab Office [phone]   Arturo Morton Brion Aliment 06/06/2023, 9:26 AM

## 2023-06-06 NOTE — TOC Initial Note (Signed)
Transition of Care [facility]) - Initial/Assessment Note    Patient Details  Name: [name] [name] MRN: [id number] Date of Birth: 1945-11-27  [facility]) CM/SW Contact:    Lorri Frederick, LCSW Phone Number: 06/06/2023, 2:09 PM  Clinical Narrative:      CSW met with pt regarding PT recommendations for SNF.  Pt is agreeable to SNF, medicare choice document provided, permission given to send out referral in hub.  Pt from home alone, has HH aide 1x per week, [employer].  Permission given to speak with daughters [name] and Burnetta Sabin, son Loraine Leriche.    Referral sent out in hub.    PASSR requires additional info to be uploaded to [facility].              Expected Discharge Plan: Skilled Nursing Facility Barriers to Discharge: Continued Medical Work up, SNF Pending bed offer   Patient Goals and CMS Choice Patient states their goals for this hospitalization and ongoing recovery are:: be brand new CMS Medicare.gov Compare Post Acute Care list provided to:: Patient Choice offered to / list presented to : Patient      Expected Discharge Plan and Services In-house Referral: Clinical Social Work   Post Acute Care Choice: Skilled Nursing Facility Living arrangements for the past 2 months: Single Family Home                                      Prior Living Arrangements/Services Living arrangements for the past 2 months: Single Family Home Lives with:: Self Patient language and need for interpreter reviewed:: Yes Do you feel safe going back to the place where you live?: Yes      Need for Family Participation in Patient Care: No (Comment) Care giver support system in place?: Yes (comment) Current home services: Homehealth aide (1x per week-more for housework) Criminal Activity/Legal Involvement Pertinent to Current Situation/Hospitalization: No - Comment as needed  Activities of Daily Living   ADL Screening (condition at time of admission) Independently performs ADLs?: Yes  (appropriate for developmental age) Is the patient deaf or have difficulty hearing?: No Does the patient have difficulty seeing, even when wearing glasses/contacts?: No Does the patient have difficulty concentrating, remembering, or making decisions?: No  Permission Sought/Granted Permission sought to share information with : Family Supports Permission granted to share information with : Yes, Verbal Permission Granted  Share Information with NAME: daughters Martyn Malay, son Loraine Leriche  Permission granted to share info w AGENCY: SNF        Emotional Assessment Appearance:: Appears stated age Attitude/Demeanor/Rapport: Engaged Affect (typically observed): Appropriate, Pleasant Orientation: : Oriented to Self, Oriented to Place, Oriented to  Time, Oriented to Situation      Admission diagnosis:  Lumbosacral radiculopathy [M54.17] Intractable pain [R52] Intractable low back pain [M54.59] Patient Active Problem List   Diagnosis Date Noted   Intractable low back pain 06/06/2023   CKD stage 3a, GFR 45-59 ml/min (HCC) 06/06/2023   Lumbar radiculopathy 06/06/2023   Cirrhosis (HCC)    Renal cell cancer (HCC) 09/12/2022   Renal mass 08/14/2022   Aortic atherosclerosis (HCC) 06/04/2022   Renal mass, left 05/27/2022   Swelling of limb 04/12/2022   Osteopenia 09/21/2021   History of nephrolithiasis 09/21/2021   Sensation of pressure in bladder area 09/21/2021   Anemia 08/17/2019   Hypertension 08/17/2019   Anxiety 08/17/2019   Lower GI bleeding 08/17/2019  Acute lower GI bleeding 08/16/2019   Autoimmune hepatitis (HCC) 12/29/2018   Chest pain 12/22/2018   Asthmatic bronchitis with exacerbation 03/21/2017   Elevated LFTs 12/24/2016   SVT (supraventricular tachycardia) (HCC) 12/24/2016   Peripheral edema 12/24/2016   S/P foot surgery, right 07/27/2015   Preoperative clearance 06/29/2015   Angioedema 08/30/2014   Recurrent UTI 01/06/2014   Chronic insomnia 01/06/2014   Acute  bilateral low back pain without sciatica 03/07/2011   Vitamin D deficiency 08/20/2010   TINNITUS, CHRONIC, 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   Lung nodule 08/03/2007   CYST AND PSEUDOCYST OF PANCREAS 08/03/2007   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   TUBULOVILLOUS ADENOMA, COLON 12/30/2006   HYPERCHOLESTEROLEMIA 12/30/2006   OBESITY 12/30/2006   TREMOR, ESSENTIAL 12/30/2006   Palpitations 12/30/2006   Seasonal and perennial allergic rhinitis 12/30/2006   GERD 12/30/2006   DIVERTICULOSIS, COLON 12/30/2006   IBS 12/30/2006   Obstructive sleep apnea 12/30/2006   URINARY INCONTINENCE 12/30/2006   Poorly controlled type 2 diabetes mellitus (HCC) 12/22/2006   PCP:  Excell Seltzer, MD Pharmacy:   [facility] [phone] [place] Otto, [place] - [address] [place] [address] Phone: [phone] Fax: [phone]     Social Determinants of Health (SDOH) Social History: SDOH Screenings   Food Insecurity: No Food Insecurity (06/06/2023)  Housing: Low Risk  (06/06/2023)  Transportation Needs: No Transportation Needs (06/06/2023)  Utilities: Not At Risk (06/06/2023)  Depression (PHQ2-9): Low Risk  (01/14/2022)  Financial Resource Strain: Low Risk  (01/14/2022)  Physical Activity: Inactive (01/14/2022)  Stress: No Stress Concern Present (01/14/2022)  Tobacco Use: Low Risk  (06/06/2023)   SDOH Interventions:     Readmission Risk Interventions     No data to display

## 2023-06-06 NOTE — Progress Notes (Signed)
PT refusing CPAP

## 2023-06-07 DIAGNOSIS — F4325 Adjustment disorder with mixed disturbance of emotions and conduct: Secondary | ICD-10-CM

## 2023-06-07 DIAGNOSIS — M545 Low back pain, unspecified: Secondary | ICD-10-CM | POA: Diagnosis not present

## 2023-06-07 LAB — CBC
HCT: 37.8 % (ref 36.0–46.0)
Hemoglobin: 12.4 g/dL (ref 12.0–15.0)
MCH: 31.6 pg (ref 26.0–34.0)
MCHC: 32.8 g/dL (ref 30.0–36.0)
MCV: 96.4 fL (ref 80.0–100.0)
Platelets: 83 10*3/uL — ABNORMAL LOW (ref 150–400)
RBC: 3.92 MIL/uL (ref 3.87–5.11)
RDW: 13.6 % (ref 11.5–15.5)
WBC: 5.3 10*3/uL (ref 4.0–10.5)
nRBC: 0 % (ref 0.0–0.2)

## 2023-06-07 LAB — BASIC METABOLIC PANEL
Anion gap: 6 (ref 5–15)
BUN: 29 mg/dL — ABNORMAL HIGH (ref 8–23)
CO2: 26 mmol/L (ref 22–32)
Calcium: 9.2 mg/dL (ref 8.9–10.3)
Chloride: 107 mmol/L (ref 98–111)
Creatinine, Ser: 1.58 mg/dL — ABNORMAL HIGH (ref 0.44–1.00)
GFR, Estimated: 34 mL/min — ABNORMAL LOW (ref >60)
Glucose, Bld: 261 mg/dL — ABNORMAL HIGH (ref 70–99)
Potassium: 5.4 mmol/L — ABNORMAL HIGH (ref 3.5–5.1)
Sodium: 139 mmol/L (ref 135–145)

## 2023-06-07 LAB — GLUCOSE, CAPILLARY
Glucose-Capillary: 252 mg/dL — ABNORMAL HIGH (ref 70–99)
Glucose-Capillary: 221 mg/dL — ABNORMAL HIGH (ref 70–99)
Glucose-Capillary: 174 mg/dL — ABNORMAL HIGH (ref 70–99)
Glucose-Capillary: 286 mg/dL — ABNORMAL HIGH (ref 70–99)
Glucose-Capillary: 264 mg/dL — ABNORMAL HIGH (ref 70–99)

## 2023-06-07 MED ORDER — TRAZODONE HCL 50 MG PO TABS
50.0000 mg | ORAL_TABLET | Freq: Every day | ORAL | Status: DC
  Administered 2023-06-07 – 2023-06-08 (×2): 50 mg via ORAL
  Filled 2023-06-07 (×2): qty 1

## 2023-06-07 MED ORDER — SODIUM ZIRCONIUM CYCLOSILICATE 5 G PO PACK
5.0000 g | PACK | Freq: Once | ORAL | Status: AC
  Administered 2023-06-07: 5 g via ORAL
  Filled 2023-06-07: qty 1

## 2023-06-07 MED ORDER — MORPHINE SULFATE (PF) 2 MG/ML IV SOLN
2.0000 mg | Freq: Four times a day (QID) | INTRAVENOUS | Status: DC | PRN
  Filled 2023-06-07 (×2): qty 1

## 2023-06-07 MED ORDER — HYDROXYZINE HCL 25 MG PO TABS
25.0000 mg | ORAL_TABLET | Freq: Three times a day (TID) | ORAL | Status: DC | PRN
  Administered 2023-06-07 – 2023-06-09 (×4): 25 mg via ORAL
  Filled 2023-06-07 (×4): qty 1

## 2023-06-07 MED ORDER — PREGABALIN 25 MG PO CAPS
50.0000 mg | ORAL_CAPSULE | Freq: Three times a day (TID) | ORAL | Status: DC
  Administered 2023-06-07 – 2023-06-09 (×6): 50 mg via ORAL
  Filled 2023-06-07 (×6): qty 2

## 2023-06-07 MED ORDER — TRAMADOL HCL 50 MG PO TABS
50.0000 mg | ORAL_TABLET | Freq: Three times a day (TID) | ORAL | Status: DC
  Administered 2023-06-07 – 2023-06-09 (×7): 50 mg via ORAL
  Filled 2023-06-07 (×7): qty 1

## 2023-06-07 NOTE — Care Management Obs Status (Signed)
MEDICARE OBSERVATION STATUS NOTIFICATION   Patient Details  Name: [name] [name] MRN: [id number] Date of Birth: [birth year]   Medicare Observation Status Notification Given:  Yes    Ronny Bacon, RN 06/07/2023, 7:02 AM

## 2023-06-07 NOTE — Progress Notes (Signed)
Patient declines CPAP. States she hasn't wore hers at home in a long time. No unit in room at this time.

## 2023-06-07 NOTE — Consult Note (Signed)
[facility] Face-to-Face Psychiatry Consult   Reason for Consult:"labile mood, constant crying, endorsing hopelessness Appreciate med recommendations" Referring Physician: Debarah Crape, DO Patient Identification: [name] [name] MRN:  [id number] Principal Diagnosis: Intractable low back pain Diagnosis:  Principal Problem:   Intractable low back pain Active Problems:   Poorly controlled type 2 diabetes mellitus (HCC)   HYPERCHOLESTEROLEMIA   Lung nodule   Obstructive sleep apnea   Autoimmune hepatitis (HCC)   Hypertension   Cirrhosis (HCC)   CKD stage 3a, GFR 45-59 ml/min (HCC)   Lumbar radiculopathy   Total Time spent with patient: 1 hour  Subjective: "I don't have any psychiatric issues and my doctor never told me you were coming to see me."  HPI: Patient is 77 y/o Caucasian female with medical history of hypertension, insulin-dependent diabetes, autoimmune hepatitis with cirrhosis, chronic low back pain with lumbar radiculopathy,  CKD 3A who was admitted for acute worsening of her back pain. Psychiatry was consulted for "labile mood, constant crying, endorsing hopelessness Appreciate med recommendations."  Today, patient was seen face to face in her hospital room. She is alert and oriented x 4. Patient is irritable and angry and questioned why she is being seen by a psychiatrist because she has never seen a psychiatrist before and her doctor never told her about this encounter. Patient reports she is currently in severe pain (rated >10/10) and she has not been able to sleep as a result. She reports constant irritability, aggression and apprehension because of her low back pain, but denies depressed mood, low energy level,delusion, hallucinations, hopelessness, suicidal or homicidal ideation, intent or plan. She identifies the love of her 5 children as the main reason to be alive. Patient accepts to take medication for sleep and aggression if offered.    Past Psychiatric History: denies   Risk  to Self:  patient denies  Risk to Others:  patient denies  Prior Inpatient Therapy:  None  Prior Outpatient Therapy:  None   Past Medical History:  Past Medical History:  Diagnosis Date   Allergic rhinitis, cause unspecified    Anxiety    Arthritis    hands, feet and back   Asthma    Benign neoplasm of colon    Bursitis    hips   CHF (congestive heart failure) (HCC)    Cirrhosis (HCC)    auto immune   Complication of anesthesia    " i WAKE UP WITH TREMORS " after shoulder surgery tremors for 10 months after surgery surgery- 2009- surgical center in [place]   Cyst and pseudocyst of pancreas    Depressive disorder, not elsewhere classified    Diabetes mellitus without complication (HCC)    Diverticulitis    Diverticulosis of colon (without mention of hemorrhage)    Dyspnea    Dysrhythmia    arrhythmia   Esophageal reflux    Essential and other specified forms of tremor    History of GI bleed    History of kidney stones    Irritable bowel syndrome    Obesity, unspecified    Other specified cardiac dysrhythmias(427.89)    Periapical abscess without sinus    Polyneuropathy in diabetes(357.2)    PONV (postoperative nausea and vomiting)    Pure hypercholesterolemia    Renal cell carcinoma (HCC)    Sleep apnea    CPAP- non compliant   Type II or unspecified type diabetes mellitus with neurological manifestations, not stated as uncontrolled(250.60)     Past Surgical History:  Procedure Laterality Date  ABDOMINAL HYSTERECTOMY     CALCANEAL OSTEOTOMY Right 07/27/2015   Procedure: RIGHT CALCANEAL OSTEOTOMY ;  Surgeon: Toni Arthurs, MD;  Location: [facility];  Service: Orthopedics;  Laterality: Right;   CHOLECYSTECTOMY     15-20 years ago   COLONOSCOPY WITH PROPOFOL N/A 08/17/2019   Procedure: COLONOSCOPY WITH PROPOFOL;  Surgeon: Jeani Hawking, MD;  Location: [facility] ENDOSCOPY;  Service: Endoscopy;  Laterality: N/A;   COLONOSCOPY WITH PROPOFOL N/A 06/21/2022    Procedure: COLONOSCOPY WITH PROPOFOL;  Surgeon: Jeani Hawking, MD;  Location: [facility] ENDOSCOPY;  Service: Gastroenterology;  Laterality: N/A;   ESOPHAGOGASTRODUODENOSCOPY (EGD) WITH PROPOFOL N/A 08/17/2019   Procedure: ESOPHAGOGASTRODUODENOSCOPY (EGD) WITH PROPOFOL;  Surgeon: Jeani Hawking, MD;  Location: [facility] ENDOSCOPY;  Service: Endoscopy;  Laterality: N/A;   GASTROCNEMIUS RECESSION Right 07/27/2015   Procedure: RIGHT GASTROC RECESSION;  Surgeon: Toni Arthurs, MD;  Location:  SURGERY CENTER;  Service: Orthopedics;  Laterality: Right;   KIDNEY STONE SURGERY  1980's   removal   LITHOTRIPSY     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   PARTIAL HYSTERECTOMY  1990's   POLYPECTOMY  08/17/2019   Procedure: POLYPECTOMY;  Surgeon: Jeani Hawking, MD;  Location: [facility] ENDOSCOPY;  Service: Endoscopy;;   POLYPECTOMY  06/21/2022   Procedure: POLYPECTOMY;  Surgeon: Jeani Hawking, MD;  Location: [facility];  Service: Gastroenterology;;   ROBOT ASSISTED LAPAROSCOPIC NEPHRECTOMY Left 08/14/2022   Procedure: XI ROBOTIC ASSISTED LAPAROSCOPIC NEPHRECTOMY;  Surgeon: Rene Paci, MD;  Location: [facility];  Service: Urology;  Laterality: Left;   ROTATOR CUFF REPAIR  2005   XI ROBOTIC LAPAROSCOPIC ASSISTED APPENDECTOMY N/A 08/14/2022   Procedure: XI ROBOTIC LAPAROSCOPIC ASSISTED APPENDECTOMY;  Surgeon: Romie Levee, MD;  Location: [facility];  Service: General;  Laterality: N/A;   Family History:  Family History  Problem Relation Age of Onset   Clotting disorder Mother    Cirrhosis Father    Diabetes Other        2 aunts   Cancer Other        grandmother   Allergies Other        "whole family"   Asthma Other        "whole family"   Family Psychiatric  History: none  Social History:  Social History   Substance and Sexual Activity  Alcohol Use No   Alcohol/week: 0.0 standard drinks of alcohol     Social History   Substance and Sexual Activity  Drug Use No    Social History   Socioeconomic History   Marital  status: Widowed    Spouse name: Not on file   Number of children: 5   Years of education: Not on file   Highest education level: Not on file  Occupational History    Employer: RETIRED  Tobacco Use   Smoking status: Never   Smokeless tobacco: Never  Vaping Use   Vaping status: Never Used  Substance and Sexual Activity   Alcohol use: No    Alcohol/week: 0.0 standard drinks of alcohol   Drug use: No   Sexual activity: Not on file  Other Topics Concern   Not on file  Social History Narrative   Husband treated for testicular cancer, 2 cups coffee a day, no exercise   Social Determinants of Health   Financial Resource Strain: Low Risk  (01/14/2022)   Overall Financial Resource Strain (CARDIA)    Difficulty of Paying Living Expenses: Not very hard  Food Insecurity: No Food Insecurity (06/06/2023)   Hunger Vital Sign  Worried About Programme researcher, broadcasting/film/video in the Last Year: Never true    Ran Out of Food in the Last Year: Never true  Transportation Needs: No Transportation Needs (06/06/2023)   PRAPARE - Administrator, Civil Service (Medical): No    Lack of Transportation (Non-Medical): No  Physical Activity: Inactive (01/14/2022)   Exercise Vital Sign    Days of Exercise per Week: 0 days    Minutes of Exercise per Session: 0 min  Stress: No Stress Concern Present (01/14/2022)   [employer] of Occupational Health - Occupational Stress Questionnaire    Feeling of Stress : Only a little  Social Connections: Not on file   Additional Social History:    Allergies:   Allergies  Allergen Reactions   Codeine Other (See Comments)    Unknown    Doxycycline Hives   Epinephrine Hcl Other (See Comments)    Had tremors when it was given.   Erythromycin Ethylsuccinate Other (See Comments)    Abdominal spasms   Eucalyptus Oil Other (See Comments)    Unknown    Iohexol Hives    Pt needs premedicated-- hives on prev contrast study per md office    Menthol Other (See  Comments)    Unknown    Metformin Diarrhea   Oxycodone Other (See Comments)    "felt like I was dying"   Tape Rash    Labs:  Results for orders placed or performed during the hospital encounter of 06/06/23 (from the past 48 hour(s))  CBC with Differential     Status: Abnormal   Collection Time: 06/06/23  2:46 AM  Result Value Ref Range   WBC 7.8 4.0 - 10.5 K/uL   RBC 4.20 3.87 - 5.11 MIL/uL   Hemoglobin 13.3 12.0 - 15.0 g/dL   HCT 41.3 24.4 - 01.0 %   MCV 94.0 80.0 - 100.0 fL   MCH 31.7 26.0 - 34.0 pg   MCHC 33.7 30.0 - 36.0 g/dL   RDW 27.2 53.6 - 64.4 %   Platelets 96 (L) 150 - 400 K/uL    Comment: SPECIMEN CHECKED FOR CLOTS Immature Platelet Fraction may be clinically indicated, consider ordering this additional test [id number] REPEATED TO VERIFY PLATELET COUNT CONFIRMED BY SMEAR    nRBC 0.0 0.0 - 0.2 %   Neutrophils Relative % 69 %   Neutro Abs 5.5 1.7 - 7.7 K/uL   Lymphocytes Relative 20 %   Lymphs Abs 1.5 0.7 - 4.0 K/uL   Monocytes Relative 9 %   Monocytes Absolute 0.7 0.1 - 1.0 K/uL   Eosinophils Relative 1 %   Eosinophils Absolute 0.1 0.0 - 0.5 K/uL   Basophils Relative 0 %   Basophils Absolute 0.0 0.0 - 0.1 K/uL   Immature Granulocytes 1 %   Abs Immature Granulocytes 0.05 0.00 - 0.07 K/uL    Comment: Performed at [facility], [address] [place], [place] [address]  Basic metabolic panel     Status: Abnormal   Collection Time: 06/06/23  2:46 AM  Result Value Ref Range   Sodium 143 135 - 145 mmol/L   Potassium 3.8 3.5 - 5.1 mmol/L   Chloride 111 98 - 111 mmol/L   CO2 24 22 - 32 mmol/L   Glucose, Bld 139 (H) 70 - 99 mg/dL    Comment: Glucose reference range applies only to samples taken after fasting for at least 8 hours.   BUN 33 (H) 8 - 23 mg/dL  Creatinine, Ser 1.44 (H) 0.44 - 1.00 mg/dL   Calcium 9.6 8.9 - 16.1 mg/dL   GFR, Estimated 37 (L) >60 mL/min    Comment: (NOTE) Calculated using the CKD-EPI Creatinine Equation (2021)    Anion gap  8 5 - 15    Comment: Performed at [facility], [address] [place], [place] [address]  Basic metabolic panel     Status: Abnormal   Collection Time: 06/06/23  4:42 AM  Result Value Ref Range   Sodium 143 135 - 145 mmol/L   Potassium 3.9 3.5 - 5.1 mmol/L   Chloride 112 (H) 98 - 111 mmol/L   CO2 24 22 - 32 mmol/L   Glucose, Bld 125 (H) 70 - 99 mg/dL    Comment: Glucose reference range applies only to samples taken after fasting for at least 8 hours.   BUN 30 (H) 8 - 23 mg/dL   Creatinine, Ser 5.40 (H) 0.44 - 1.00 mg/dL   Calcium 9.3 8.9 - 98.1 mg/dL   GFR, Estimated 42 (L) >60 mL/min    Comment: (NOTE) Calculated using the CKD-EPI Creatinine Equation (2021)    Anion gap 7 5 - 15    Comment: Performed at [facility], [address], [place], [place] [address]  CBC     Status: Abnormal   Collection Time: 06/06/23  4:42 AM  Result Value Ref Range   WBC 6.9 4.0 - 10.5 K/uL   RBC 4.17 3.87 - 5.11 MIL/uL   Hemoglobin 13.1 12.0 - 15.0 g/dL   HCT 82.9 56.2 - 13.0 %   MCV 95.0 80.0 - 100.0 fL   MCH 31.4 26.0 - 34.0 pg   MCHC 33.1 30.0 - 36.0 g/dL   RDW 86.5 78.4 - 69.6 %   Platelets 91 (L) 150 - 400 K/uL    Comment: Immature Platelet Fraction may be clinically indicated, consider ordering this additional test [id number] REPEATED TO VERIFY    nRBC 0.0 0.0 - 0.2 %    Comment: Performed at [facility], [address], [place], [place] [address]  Glucose, capillary     Status: Abnormal   Collection Time: 06/06/23  7:37 AM  Result Value Ref Range   Glucose-Capillary 130 (H) 70 - 99 mg/dL    Comment: Glucose reference range applies only to samples taken after fasting for at least 8 hours.  Glucose, capillary     Status: Abnormal   Collection Time: 06/06/23 11:15 AM  Result Value Ref Range   Glucose-Capillary 200 (H) 70 - 99 mg/dL    Comment: Glucose reference range applies only to samples taken after fasting for at least 8 hours.  Glucose, capillary     Status:  Abnormal   Collection Time: 06/06/23  4:39 PM  Result Value Ref Range   Glucose-Capillary 211 (H) 70 - 99 mg/dL    Comment: Glucose reference range applies only to samples taken after fasting for at least 8 hours.  Glucose, capillary     Status: Abnormal   Collection Time: 06/06/23  8:44 PM  Result Value Ref Range   Glucose-Capillary 291 (H) 70 - 99 mg/dL    Comment: Glucose reference range applies only to samples taken after fasting for at least 8 hours.  Glucose, capillary     Status: Abnormal   Collection Time: 06/07/23  3:33 AM  Result Value Ref Range   Glucose-Capillary 252 (H) 70 - 99 mg/dL    Comment: Glucose reference range applies  only to samples taken after fasting for at least 8 hours.  Basic metabolic panel     Status: Abnormal   Collection Time: 06/07/23  5:19 AM  Result Value Ref Range   Sodium 139 135 - 145 mmol/L   Potassium 5.4 (H) 3.5 - 5.1 mmol/L   Chloride 107 98 - 111 mmol/L   CO2 26 22 - 32 mmol/L   Glucose, Bld 261 (H) 70 - 99 mg/dL    Comment: Glucose reference range applies only to samples taken after fasting for at least 8 hours.   BUN 29 (H) 8 - 23 mg/dL   Creatinine, Ser 1.61 (H) 0.44 - 1.00 mg/dL   Calcium 9.2 8.9 - 09.6 mg/dL   GFR, Estimated 34 (L) >60 mL/min    Comment: (NOTE) Calculated using the CKD-EPI Creatinine Equation (2021)    Anion gap 6 5 - 15    Comment: Performed at [facility], [address] [place], [place] [address]  CBC     Status: Abnormal   Collection Time: 06/07/23  5:19 AM  Result Value Ref Range   WBC 5.3 4.0 - 10.5 K/uL   RBC 3.92 3.87 - 5.11 MIL/uL   Hemoglobin 12.4 12.0 - 15.0 g/dL   HCT 98.1 19.1 - 47.8 %   MCV 96.4 80.0 - 100.0 fL   MCH 31.6 26.0 - 34.0 pg   MCHC 32.8 30.0 - 36.0 g/dL   RDW 29.5 62.1 - 30.8 %   Platelets 83 (L) 150 - 400 K/uL    Comment: Immature Platelet Fraction may be clinically indicated, consider ordering this additional test [id number] REPEATED TO VERIFY    nRBC 0.0 0.0 - 0.2 %     Comment: Performed at [facility], [address] [place], [place] [address]  Glucose, capillary     Status: Abnormal   Collection Time: 06/07/23  8:23 AM  Result Value Ref Range   Glucose-Capillary 221 (H) 70 - 99 mg/dL    Comment: Glucose reference range applies only to samples taken after fasting for at least 8 hours.  Glucose, capillary     Status: Abnormal   Collection Time: 06/07/23 11:47 AM  Result Value Ref Range   Glucose-Capillary 174 (H) 70 - 99 mg/dL    Comment: Glucose reference range applies only to samples taken after fasting for at least 8 hours.    Current Facility-Administered Medications  Medication Dose Route Frequency Provider Last Rate Last Admin   acetaminophen (TYLENOL) tablet 500 mg  500 mg Oral Q6H Opyd, Lavone Neri, MD       albuterol (PROVENTIL) (2.5 MG/3ML) 0.083% nebulizer solution 2.5 mg  2.5 mg Inhalation Q6H PRN Opyd, Lavone Neri, MD       dicyclomine (BENTYL) tablet 20 mg  20 mg Oral Q12H PRN Dezii, Alexandra, DO       insulin aspart (novoLOG) injection 0-15 Units  0-15 Units Subcutaneous TID WC Dezii, Alexandra, DO   3 Units at 06/07/23 1322   insulin glargine-yfgn (SEMGLEE) injection 5 Units  5 Units Subcutaneous QHS Dezii, Alexandra, DO   5 Units at 06/06/23 2048   lidocaine (LIDODERM) 5 % 1 patch  1 patch Transdermal Q24H Dezii, Alexandra, DO       loratadine (CLARITIN) tablet 10 mg  10 mg Oral Daily Dezii, Alexandra, DO   10 mg at 06/07/23 4132   methocarbamol (ROBAXIN) tablet 500 mg  500 mg Oral Q6H PRN Opyd, Lavone Neri, MD   500 mg  at 06/06/23 2045   mirabegron ER (MYRBETRIQ) tablet 50 mg  50 mg Oral Daily Dezii, Alexandra, DO       morphine (PF) 2 MG/ML injection 2 mg  2 mg Intravenous Q6H PRN Dezii, Alexandra, DO       ondansetron (ZOFRAN) tablet 4 mg  4 mg Oral Q6H PRN Opyd, Lavone Neri, MD       Or   ondansetron (ZOFRAN) injection 4 mg  4 mg Intravenous Q6H PRN Opyd, Lavone Neri, MD   4 mg at 06/06/23 1134   oxyCODONE (Oxy IR/ROXICODONE)  immediate release tablet 5 mg  5 mg Oral Q4H PRN Opyd, Lavone Neri, MD       pantoprazole (PROTONIX) EC tablet 40 mg  40 mg Oral Daily Opyd, Lavone Neri, MD   40 mg at 06/07/23 8657   polyethylene glycol (MIRALAX / GLYCOLAX) packet 17 g  17 g Oral Daily PRN Opyd, Lavone Neri, MD       predniSONE (DELTASONE) tablet 10 mg  10 mg Oral Q breakfast Dezii, Alexandra, DO   10 mg at 06/07/23 8469   pregabalin (LYRICA) capsule 50 mg  50 mg Oral TID Debarah Crape, DO       traMADol (ULTRAM) tablet 50 mg  50 mg Oral Q8H Dezii, Gordy Councilman, DO        Musculoskeletal: Strength & Muscle Tone:  not assessed  Gait & Station:  not assessed Patient leans: N/A  Psychiatric Specialty Exam:  Presentation  General Appearance:  Casual  Eye Contact: Poor  Speech: Normal Rate  Speech Volume: Increased  Handedness: Right   Mood and Affect  Mood: Angry; Irritable  Affect: Congruent   Thought Process  Thought Processes: Goal Directed  Descriptions of Associations:Intact  Orientation:Full (Time, Place and Person)  Thought Content:Logical  History of Schizophrenia/Schizoaffective disorder:No data recorded Duration of Psychotic Symptoms:No data recorded Hallucinations:Hallucinations: None  Ideas of Reference:None  Suicidal Thoughts:Suicidal Thoughts: No  Homicidal Thoughts:Homicidal Thoughts: No   Sensorium  Memory: Recent Good; Remote Good  Judgment: Fair  Insight: Poor; Fair   Art therapist  Concentration: Good  Attention Span: Good  Recall: Dudley Major of Knowledge: Good  Language: Good   Psychomotor Activity  Psychomotor Activity: Psychomotor Activity: Increased   Assets  Assets: Communication Skills; Social Support   Sleep  Sleep: Sleep: Poor   Physical Exam: Physical Exam Review of Systems  Psychiatric/Behavioral:  Negative for depression, hallucinations, memory loss and suicidal ideas. The patient is nervous/anxious and has insomnia.     Blood pressure 112/78, pulse 61, temperature 99.4 F (37.4 C), temperature source Oral, resp. rate 20, height 5\' 3"  (1.6 m), weight 90.7 kg, SpO2 97%. Body mass index is 35.42 kg/m.  Treatment Plan Summary: Patient is 77 y/o Caucasian female with medical history of hypertension, insulin-dependent diabetes, autoimmune hepatitis with cirrhosis, chronic low back pain with lumbar radiculopathy,  CKD 3A who was admitted for acute worsening of her back pain. Patient reports ongoing severe pain, irritability aggression and poor sleep. She denies suicidal or homicidal ideation, intent or plan.   Plan/Recommendation: -Consider Hydroxyzine 25 mg three times daily ad needed for anxiety/aggression -Consider Trazodone 50 mg at bedtime for sleep -Continue pain management   Disposition: No evidence of imminent risk to self or others at present.   Patient does not meet criteria for psychiatric inpatient admission. Supportive therapy provided about ongoing stressors. Psychiatric consult service will continue to follow up.   Fredonia Highland, MD 06/07/2023 1:56 PM

## 2023-06-07 NOTE — Progress Notes (Signed)
Daughter [name] brought healthcare power of attorney.  A copy of this document was made by this nurse and placed in the chart

## 2023-06-07 NOTE — Progress Notes (Addendum)
PROGRESS NOTE    [name] [name]  OVF:[id number] DOB: [birth year] DOA: 06/06/2023 PCP: Excell Seltzer, MD  Chief Complaint  Patient presents with   Leg Pain    Hospital Course:  [name] [name] is 77 y.o. female with hypertension, insulin-dependent diabetes, autoimmune hepatitis with cirrhosis, chronic low back pain with lumbar radiculopathy ongoing workup with [name] [name], and CKD 3A who presents with acute worsening of her low back pain.  Patient reports that she was shopping at [facility] [name] in a motorized scooter and denies any trauma or inciting event but family began having severe low back pain on the right side.  Denies any lower extremity weakness, numbness, fever, chills or change in bowel habits.  She took 2 Lyrica without relief prior to coming to the ED.  On arrival patient was found to have hemodynamically stable vitals, and labs largely consistent with her baseline.  She was admitted for MRI lumbar spine, pain control, PT. patient's pain has been very difficult to manage.  She is refusing most pain medications stating that they do not work for her.  Pain medications are further limited by patient's cirrhosis, CKD, and extensive allergy profile.  Lumbar MRI reveals worsening of foraminal stenosis, discussed with neurosurgery on 11/15 who reports no acute intervention necessary at this time.  They will follow-up with patient outpatient.  Patient reports that she will get a second opinion.  Subjective: Ongoing difficulties with treatment plan.  Patient continues to refuse oxycodone, muscle relaxers, lidocaine patches.  She is also requesting additional pain medication.  I had extensive discussion with her at bedside, RN [name] [name] present for conversation.  We discussed increasing her Lyrica and adding scheduled tramadol.  I also discussed the role of steroids in acute low back pain.  She is adamant that she will not take any further steroids.  She was amendable to tramadol and increased dose of Lyrica.  We  discussed that fentanyl was not an appropriate long-term choice given this is chronic pain.  We discussed the neurosurgery recommendations regarding the most recent lumbar MRI.  Patient became upset and reported she will be seeking a second opinion. Patient remains very tearful and difficult to direct during our conversation.  Speech is often tangential.  Her son is outside the room but patient will not consent to me speaking with him.  Her son does report that they have had significant difficulty managing her care recently. Given patient's labile mood, tangential speech, and tearfulness, I did consult psychiatry this morning for evaluation.  This angered [name].  She reports that she has no interest in speaking with a psychiatrist. She does report she will speak with a psychologist, she would not elaborate further on this or continue to entertain discussion regarding her mental health. It is unclear if there is some degree of mild cognitive impairment precipitating some of this mood lability.  Patient will not participate in cognitive testing type questions.  We will continue to reevaluate this daily.   Objective: Vitals:   06/06/23 1939 06/06/23 2104 06/07/23 0407 06/07/23 0808  BP: (!) 106/37 125/64 (!) 109/47 112/78  Pulse: (!) 49 (!) 55 (!) 52 61  Resp: 17 16 18 20   Temp: 98.6 F (37 C) 98.1 F (36.7 C) 98.4 F (36.9 C) 99.4 F (37.4 C)  TempSrc: Oral Oral Oral Oral  SpO2: 95% 98% 91% 97%  Weight:      Height:        Intake/Output Summary (Last 24 hours) at 06/07/2023 1316 Last data  filed at 06/06/2023 2100 Gross per 24 hour  Intake 240 ml  Output --  Net 240 ml   Filed Weights   06/06/23 0032  Weight: 90.7 kg    Examination: General exam: Sitting upright in chair.  Cries easily.  Also becomes angry.  Mood shifts rapidly. Respiratory system: No work of breathing, symmetric chest wall expansion Cardiovascular system: S1 & S2 heard, RRR.  Gastrointestinal system: Abdomen  is nondistended, soft and nontender.  Neuro: Alert and oriented. No focal neurological deficits. Extremities: Symmetric, expected ROM Skin: No rashes, lesions Psychiatry: Tearful, significant mood lability.  Assessment & Plan:  Principal Problem:   Intractable low back pain Active Problems:   Poorly controlled type 2 diabetes mellitus (HCC)   HYPERCHOLESTEROLEMIA   Lung nodule   Obstructive sleep apnea   Autoimmune hepatitis (HCC)   Hypertension   Cirrhosis (HCC)   CKD stage 3a, GFR 45-59 ml/min (HCC)   Lumbar radiculopathy  Acute on chronic low back pain History of chronic lumbar radiculopathy followed by Dr. Lovell Sheehan of neurosurgery, reports she has upcoming follow-up appointment with Kendell Bane at the end of the week and is scheduled for outpatient MRI -MRI this admission reveals slight progression of severe left neural foraminal narrowing at L5-S1, consulted Neurosurgery, Dr. Lovell Sheehan, reports no need for acute intervention and that we will he will continue to follow with her outpatient -Pain control continues to be difficult.  Have had multiple extensive discussions with the patient regarding the importance of multimodal pain control.  She remains steadfast that she will not take Tylenol, antispasmodics, lidocaine patches, or steroids.  She has extensive allergy profile, CKD, and cirrhosis which makes this particularly difficult to manage. For now we will increase her home dose Lyrica, and add scheduled tramadol and lieu of morphine which the patient reports is no longer working.  Have discussed these doses with pharmacy given her multiple comorbidities and difficulty with clearance. -As last resort patient can be started on low-dose fentanyl patch, though given that this is chronic pain I cannot recommend opioids long term.  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  Will discontinue during this admission. -Cont 10mg  prednisone home med Will hold Lasix for now given increased creatinine and normotensive/low BPs. Continue outpatient follow-up with GI  CKD 3A Appears baseline creatinine close to 1.19 as of October 2024, currently 1.44 on admission Renally dose medications Cr Improving. Avoid nephrotoxic's when possible Trend CMP  Type 2 diabetes A1c 7.9% 07/2022 Patient is on insulin pump at home but does not have it with her at this time.  We will continue with sliding scale insulin for now. Initiate basal bolus low-dose and titrate up as tolerated.  Lung nodules Outpt currently following with Dr. Leonides Schanz of oncology Planning for repeat CT next month  OSA CPAP while sleeping  Hypertension On atenolol outpatient.  Will hold at this time given borderline bradycardia and low normal BPs  Thrombocytopenia Platelets 96K on admission This is secondary to her chronic liver disease.  Will trend on CBC.  No acute intervention warranted at this time.  History of renal cell carcinoma Status post nephrectomy Follows with oncology. CKD management as above  BMI 35 Needs outpatient follow-up for weight loss counseling and risk modification.  Patient nonadherence to treatment plan Mood lability  See above. Patient remains obstructive to her care.  Unclear if there is some degree of cognitive impairment that underlies her resistance. Consulted Psych but patient has refused evaluation. Cont with chaplain for emotional support RPN   DVT prophylaxis: SCDs Code Status: Full Family Communication: Pt has not consented I discuss her care with her son who was at bedside, discussed with patient directly   **11/15 1700: Was able to discuss patient's care with her daughter and healthcare power of attorney [name] [name].  [name] [name]  reports that they have had an ongoing difficulties managing her care recently.  She reports that her mom's current mood swings are worse than baseline.  She does report that the patient has a history of steadfastness but not to this degree.  They have also wondered about possible underlying cognitive impairment.  [name] [name] was provided with updates regarding care plan and agrees with possible discharge to SNF/rehab for ongoing physical therapy.  They will continue to discuss.   Disposition:  Status is: Observation currently, still has ongoing difficulty with pain control.  Will continue titrating medications.  Likely SNF at discharge.  TOC is consulted and working on it.  Consultants:  Neurosurgery  Procedures:  N/a  Antimicrobials:  Anti-infectives (From admission, onward)    None       Data Reviewed: I have personally reviewed following labs and imaging studies CBC: Recent Labs  Lab 06/06/23 0246 06/06/23 0442 06/07/23 0519  WBC 7.8 6.9 5.3  NEUTROABS 5.5  --   --   HGB 13.3 13.1 12.4  HCT 39.5 39.6 37.8  MCV 94.0 95.0 96.4  PLT 96* 91* 83*   Basic Metabolic Panel: Recent Labs  Lab 06/06/23 0246 06/06/23 0442 06/07/23 0519  NA 143 143 139  K 3.8 3.9 5.4*  CL 111 112* 107  CO2 24 24 26   GLUCOSE 139* 125* 261*  BUN 33* 30* 29*  CREATININE 1.44* 1.30* 1.58*  CALCIUM 9.6 9.3 9.2   GFR: Estimated Creatinine Clearance: 31.9 mL/min (A) (by C-G formula based on SCr of 1.58 mg/dL (H)). Liver Function Tests: No results for input(s): "AST", "ALT", "ALKPHOS", "BILITOT", "PROT", "ALBUMIN" in the last 168 hours. CBG: Recent Labs  Lab 06/06/23 1639 06/06/23 2044 06/07/23 0333 06/07/23 0823 06/07/23 1147  GLUCAP 211* 291* 252* 221* 174*    No results found for this or any previous visit (from the past 240 hour(s)).   Radiology Studies: MR LUMBAR SPINE WO CONTRAST  Result Date: 06/06/2023 CLINICAL DATA:  Low back pain, symptoms persist with > 6 wks treatment EXAM:  MRI LUMBAR SPINE WITHOUT CONTRAST TECHNIQUE: Multiplanar, multisequence MR imaging of the lumbar spine was performed. No intravenous contrast was administered. COMPARISON:  Lumbar spine MRI 05/24/2022 FINDINGS: Segmentation:  Standard. Alignment:  Physiologic. Vertebrae:  No fracture, evidence of discitis, or bone lesion. Conus medullaris and cauda equina: Conus extends to the L1 level. Conus and cauda equina appear normal. Paraspinal and other soft tissues: Status post left-sided nephrectomy. There is a small splenule adjacent to the spleen (series 5, image 6). There is likely also a 1.3 x 0.9 cm splenic artery aneurysm (series 5, image 5). 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 No spinal canal or neural foraminal stenosis. L1-L2: Mild bilateral facet degenerative change. Minimal disc bulge. No spinal canal narrowing. No neural foraminal narrowing. L2-L3: Moderate bilateral facet degenerative change. Short pedicles. Circumferential disc bulge. Moderate spinal canal narrowing. Mild bilateral neural foraminal narrowing, right-greater-than-left. Findings  are unchanged from prior exam L3-L4: Moderate bilateral facet degenerative change. Ligamentum flavum hypertrophy. Circumferential disc bulge. Short pedicles. Severe spinal canal narrowing. Moderate bilateral neural foraminal narrowing. Findings are unchanged from prior exam. L4-L5: Moderate bilateral facet degenerative change. Short pedicles. Circumferential disc bulge. Moderate spinal canal narrowing. Severe left and moderate right neural foraminal narrowing. Findings are unchanged from prior exam L5-S1: Moderate bilateral facet degenerative change. Circumferential disc bulge. Mild spinal canal narrowing. Severe left and moderate right neural foraminal narrowing. Findings have slightly progressed on the left. IMPRESSION: 1. Slight progression of severe left neural foraminal narrowing at  L5-S1. 2. Otherwise, no significant interval change in multilevel degenerative changes of the lumbar spine, as described above. 3. Likely 1.3 x 0.9 cm splenic artery aneurysm. Recommend further evaluation with a dedicated CTA of the abdomen. Electronically Signed   By: Lorenza Cambridge M.D.   On: 06/06/2023 14:39   DG HIP UNILAT WITH PELVIS 2-3 VIEWS RIGHT  Result Date: 06/06/2023 CLINICAL DATA:  Pain EXAM: DG HIP (WITH OR WITHOUT PELVIS) 2-3V RIGHT COMPARISON:  None Available. FINDINGS: No fracture or dislocation is seen. Bilateral hip joint spaces are preserved. Visualized bony pelvis appears intact. Mild degenerative changes of the lower lumbar spine. IMPRESSION: Negative. Electronically Signed   By: Charline Bills M.D.   On: 06/06/2023 01:17    Scheduled Meds:  acetaminophen  500 mg Oral Q6H   insulin aspart  0-15 Units Subcutaneous TID WC   insulin glargine-yfgn  5 Units Subcutaneous QHS   lidocaine  1 patch Transdermal Q24H   loratadine  10 mg Oral Daily   mirabegron ER  50 mg Oral Daily   morphine  15 mg Oral Q12H   pantoprazole  40 mg Oral Daily   predniSONE  10 mg Oral Q breakfast   pregabalin  25 mg Oral TID   sodium zirconium cyclosilicate  5 g Oral Once   Continuous Infusions:   LOS: 0 days    Time spent:  Debarah Crape, DO [facility]  To contact the attending physician between 7A-7P please use Epic Chat. To contact the covering physician during after hours 7P-7A, please review Amion.   06/07/2023, 1:16 PM

## 2023-06-08 DIAGNOSIS — E1142 Type 2 diabetes mellitus with diabetic polyneuropathy: Secondary | ICD-10-CM | POA: Diagnosis not present

## 2023-06-08 DIAGNOSIS — Z6835 Body mass index (BMI) 35.0-35.9, adult: Secondary | ICD-10-CM | POA: Diagnosis not present

## 2023-06-08 DIAGNOSIS — N1831 Chronic kidney disease, stage 3a: Secondary | ICD-10-CM | POA: Diagnosis not present

## 2023-06-08 DIAGNOSIS — E1149 Type 2 diabetes mellitus with other diabetic neurological complication: Secondary | ICD-10-CM | POA: Diagnosis not present

## 2023-06-08 DIAGNOSIS — K754 Autoimmune hepatitis: Secondary | ICD-10-CM | POA: Diagnosis not present

## 2023-06-08 DIAGNOSIS — M545 Low back pain, unspecified: Secondary | ICD-10-CM | POA: Diagnosis not present

## 2023-06-08 DIAGNOSIS — M5417 Radiculopathy, lumbosacral region: Secondary | ICD-10-CM | POA: Diagnosis present

## 2023-06-08 DIAGNOSIS — Z905 Acquired absence of kidney: Secondary | ICD-10-CM | POA: Diagnosis not present

## 2023-06-08 DIAGNOSIS — K7460 Unspecified cirrhosis of liver: Secondary | ICD-10-CM | POA: Diagnosis not present

## 2023-06-08 DIAGNOSIS — G8929 Other chronic pain: Secondary | ICD-10-CM | POA: Diagnosis present

## 2023-06-08 DIAGNOSIS — I129 Hypertensive chronic kidney disease with stage 1 through stage 4 chronic kidney disease, or unspecified chronic kidney disease: Secondary | ICD-10-CM | POA: Diagnosis not present

## 2023-06-08 DIAGNOSIS — E7800 Pure hypercholesterolemia, unspecified: Secondary | ICD-10-CM | POA: Diagnosis not present

## 2023-06-08 DIAGNOSIS — R918 Other nonspecific abnormal finding of lung field: Secondary | ICD-10-CM | POA: Diagnosis not present

## 2023-06-08 DIAGNOSIS — Z85528 Personal history of other malignant neoplasm of kidney: Secondary | ICD-10-CM | POA: Diagnosis not present

## 2023-06-08 DIAGNOSIS — G4700 Insomnia, unspecified: Secondary | ICD-10-CM | POA: Diagnosis not present

## 2023-06-08 DIAGNOSIS — M5459 Other low back pain: Secondary | ICD-10-CM | POA: Diagnosis not present

## 2023-06-08 DIAGNOSIS — Z9641 Presence of insulin pump (external) (internal): Secondary | ICD-10-CM | POA: Diagnosis not present

## 2023-06-08 DIAGNOSIS — Z881 Allergy status to other antibiotic agents status: Secondary | ICD-10-CM | POA: Diagnosis not present

## 2023-06-08 DIAGNOSIS — N3000 Acute cystitis without hematuria: Secondary | ICD-10-CM | POA: Diagnosis not present

## 2023-06-08 DIAGNOSIS — E1122 Type 2 diabetes mellitus with diabetic chronic kidney disease: Secondary | ICD-10-CM | POA: Diagnosis not present

## 2023-06-08 DIAGNOSIS — M4807 Spinal stenosis, lumbosacral region: Secondary | ICD-10-CM | POA: Diagnosis not present

## 2023-06-08 DIAGNOSIS — E1165 Type 2 diabetes mellitus with hyperglycemia: Secondary | ICD-10-CM | POA: Diagnosis not present

## 2023-06-08 DIAGNOSIS — R001 Bradycardia, unspecified: Secondary | ICD-10-CM | POA: Diagnosis not present

## 2023-06-08 DIAGNOSIS — I728 Aneurysm of other specified arteries: Secondary | ICD-10-CM | POA: Diagnosis not present

## 2023-06-08 DIAGNOSIS — Z794 Long term (current) use of insulin: Secondary | ICD-10-CM | POA: Diagnosis not present

## 2023-06-08 DIAGNOSIS — G4733 Obstructive sleep apnea (adult) (pediatric): Secondary | ICD-10-CM | POA: Diagnosis not present

## 2023-06-08 DIAGNOSIS — D6959 Other secondary thrombocytopenia: Secondary | ICD-10-CM | POA: Diagnosis not present

## 2023-06-08 LAB — CBC
HCT: 36.6 % (ref 36.0–46.0)
Hemoglobin: 12 g/dL (ref 12.0–15.0)
MCH: 31.1 pg (ref 26.0–34.0)
MCHC: 32.8 g/dL (ref 30.0–36.0)
MCV: 94.8 fL (ref 80.0–100.0)
Platelets: 76 10*3/uL — ABNORMAL LOW (ref 150–400)
RBC: 3.86 MIL/uL — ABNORMAL LOW (ref 3.87–5.11)
RDW: 13.2 % (ref 11.5–15.5)
WBC: 5.5 10*3/uL (ref 4.0–10.5)
nRBC: 0 % (ref 0.0–0.2)

## 2023-06-08 LAB — GLUCOSE, CAPILLARY
Glucose-Capillary: 253 mg/dL — ABNORMAL HIGH (ref 70–99)
Glucose-Capillary: 227 mg/dL — ABNORMAL HIGH (ref 70–99)
Glucose-Capillary: 265 mg/dL — ABNORMAL HIGH (ref 70–99)
Glucose-Capillary: 324 mg/dL — ABNORMAL HIGH (ref 70–99)
Glucose-Capillary: 148 mg/dL — ABNORMAL HIGH (ref 70–99)

## 2023-06-08 LAB — BASIC METABOLIC PANEL
Anion gap: 7 (ref 5–15)
BUN: 24 mg/dL — ABNORMAL HIGH (ref 8–23)
CO2: 23 mmol/L (ref 22–32)
Calcium: 9.1 mg/dL (ref 8.9–10.3)
Chloride: 105 mmol/L (ref 98–111)
Creatinine, Ser: 1.25 mg/dL — ABNORMAL HIGH (ref 0.44–1.00)
GFR, Estimated: 44 mL/min — ABNORMAL LOW (ref >60)
Glucose, Bld: 238 mg/dL — ABNORMAL HIGH (ref 70–99)
Potassium: 4.3 mmol/L (ref 3.5–5.1)
Sodium: 135 mmol/L (ref 135–145)

## 2023-06-08 MED ORDER — INSULIN GLARGINE-YFGN 100 UNIT/ML ~~LOC~~ SOLN: 8.0000 [IU] | Freq: Every day | SUBCUTANEOUS | Status: DC

## 2023-06-08 MED ORDER — INSULIN GLARGINE-YFGN 100 UNIT/ML ~~LOC~~ SOLN
10.0000 [IU] | Freq: Every day | SUBCUTANEOUS | Status: DC
  Administered 2023-06-08: 10 [IU] via SUBCUTANEOUS
  Filled 2023-06-08 (×2): qty 0.1

## 2023-06-08 NOTE — Plan of Care (Signed)
  Problem: Coping: Goal: Ability to adjust to condition or change in health will improve Outcome: Progressing   Problem: Activity: Goal: Risk for activity intolerance will decrease Outcome: Progressing   

## 2023-06-08 NOTE — Progress Notes (Signed)
PROGRESS NOTE    [name] [name]  XBM:[id number] DOB: [birth year] DOA: 06/06/2023 PCP: Excell Seltzer, MD  Chief Complaint  Patient presents with   Leg Pain    Hospital Course:  [name] [name] is 77 y.o. female with hypertension, insulin-dependent diabetes, autoimmune hepatitis with cirrhosis, chronic low back pain with lumbar radiculopathy ongoing workup with [facility], and CKD 3A who presents with acute worsening of her low back pain.  Patient reports that she was shopping at [facility] in a motorized scooter and denies any trauma or inciting event but family began having severe low back pain on the right side.  Denies any lower extremity weakness, numbness, fever, chills or change in bowel habits.  She took 2 Lyrica without relief prior to coming to the ED.  On arrival patient was found to have hemodynamically stable vitals, and labs largely consistent with her baseline.  She was admitted for MRI lumbar spine, pain control, PT. Lumbar MRI reveals worsening of foraminal stenosis, discussed with neurosurgery on 11/15 who reports no acute intervention necessary at this time.  They will follow-up with patient outpatient.  Patient reports that she will get a second opinion.  Hospitalization has been complicated by patient's refusal with various aspects of her treatment plan.  Pain management options are significantly limited due to patient's CKD, cirrhosis, and extensive allergy profile.  After discussion with pharmacist and extensive counseling with the patient it appears that on 11/16 we found an appropriate pain regimen for her. Patient has also demonstrated significantly labile mood and at times has been aggressive with the medical staff.  Had extensive discussion with the patient's daughter, [name] [name], who is also HPOA on 11/16 and she informed me that at baseline the patient is more amendable to treatment, and agrees that the patient seems off baseline.  I consulted psychiatry on 11/16 due to concerns of  mild cognitive impairment, aggressive behaviors and nearly constant crying, however patient refused to participate in their evaluation.  On evaluation 11/17 patient appeared significantly improved from prior evaluations.  She did receive trazodone the night prior which was a new medication for her and I believe that this helped significantly regarding mood stability and insomnia.  Subjective: [name] [name] is calm and redirectable this morning.  She reports she slept well with trazodone and would like to continue taking this medication.  She has also requested a prescription for it at discharge.  He is also endorsing satisfaction with current doses of tramadol and Lyrica and agrees to continue taking these medications.   Objective: Vitals:   06/07/23 2014 06/08/23 0534 06/08/23 0726 06/08/23 1500  BP: (!) 146/59 (!) 140/48 (!) 133/50 (!) 133/46  Pulse: (!) 55 64 (!) 53 (!) 57  Resp: 18 18 18 18   Temp: 98.3 F (36.8 C) 98.3 F (36.8 C) 98.3 F (36.8 C) 97.8 F (36.6 C)  TempSrc: Oral Oral Oral Oral  SpO2: 97% 95% 95% 97%  Weight:      Height:        Intake/Output Summary (Last 24 hours) at 06/08/2023 1610 Last data filed at 06/08/2023 1501 Gross per 24 hour  Intake 720 ml  Output --  Net 720 ml   Filed Weights   06/06/23 0032  Weight: 90.7 kg    Examination: General exam: Sitting upright in chair.  Cries easily.  Also becomes angry.  Mood shifts rapidly. Respiratory system: No work of breathing, symmetric chest wall expansion Cardiovascular system: S1 & S2 heard, RRR.  Gastrointestinal system: Abdomen is  nondistended, soft and nontender.  Neuro: Alert and oriented. No focal neurological deficits. Extremities: Symmetric, expected ROM Skin: No rashes, lesions Psychiatry: Tearful, significant mood lability.  Assessment & Plan:  Principal Problem:   Intractable low back pain Active Problems:   Poorly controlled type 2 diabetes mellitus (HCC)   HYPERCHOLESTEROLEMIA   Lung  nodule   Obstructive sleep apnea   Autoimmune hepatitis (HCC)   Hypertension   Cirrhosis (HCC)   CKD stage 3a, GFR 45-59 ml/min (HCC)   Lumbar radiculopathy   Adjustment disorder with mixed disturbance of emotions and conduct  Acute on chronic low back pain History of chronic lumbar radiculopathy followed by Dr. Lovell Sheehan of neurosurgery, reports she has upcoming follow-up appointment with Kendell Bane at the end of the week  -MRI this admission reveals slight progression of severe left neural foraminal narrowing at L5-S1, consulted Neurosurgery, Dr. Lovell Sheehan, reports no need for acute intervention and that we will he will continue to follow with her outpatient -Pain control appears much better today. Cont schedule tramadol, lyrica, and PRN Morphine (though pt has not requested morphine today) - Have had multiple extensive discussions with the patient regarding the importance of multimodal pain control.   - extensive allergy profile, CKD, and cirrhosis which makes this particularly difficult to manage. 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  Will discontinue during this admission. -Cont 10mg  prednisone home med Will hold Lasix for now given increased creatinine and normotensive/low BPs. Continue outpatient follow-up with GI  CKD 3A Appears baseline creatinine close to 1.19 as of October 2024, currently 1.44 on admission Renally dose medications Cr Improving. Avoid nephrotoxic's when possible Trend CMP  Type 2 diabetes A1c 7.9% 07/2022 Patient is on insulin pump at home but does not have it with her at this time.  We will continue with sliding scale insulin for now. Initiate basal bolus low-dose and titrate up as tolerated.  Lung  nodules Outpt currently following with Dr. Leonides Schanz of oncology Planning for repeat CT next month  OSA CPAP while sleeping  Hypertension On atenolol outpatient.  Will hold at this time given borderline bradycardia and low normal BPs  Thrombocytopenia Platelets 96K on admission This is secondary to her chronic liver disease.  Will trend on CBC.  No acute intervention warranted at this time.  History of renal cell carcinoma Status post nephrectomy Follows with oncology. CKD management as above  BMI 35 Needs outpatient follow-up for weight loss counseling and risk modification.  Patient nonadherence to treatment plan Mood lability Much improved this AM after initiation of trazodone, tramadol and lyrica yesterday. Unclear if underlying MCI or just acute delirium but will cont frequent reorientation and monitoring. Consulted Psych but patient has refused evaluation. Cont with chaplain for emotional support RPN   DVT prophylaxis: SCDs Code Status: Full Family Communication:  Discussed care plan with Daughter, [name] [name]. Disposition:  Has crossed 2 midnights due to difficulty with pain control. Will make inpatient.  Patient would benefit from inpatient rehab.  Consult has been placed.  Consultants:  Neurosurgery  Procedures:  N/a  Antimicrobials:  Anti-infectives (From admission, onward)    None       Data Reviewed: I have personally reviewed following labs and imaging studies CBC: Recent Labs  Lab 06/06/23 0246 06/06/23 0442  06/07/23 0519 06/08/23 0547  WBC 7.8 6.9 5.3 5.5  NEUTROABS 5.5  --   --   --   HGB 13.3 13.1 12.4 12.0  HCT 39.5 39.6 37.8 36.6  MCV 94.0 95.0 96.4 94.8  PLT 96* 91* 83* 76*   Basic Metabolic Panel: Recent Labs  Lab 06/06/23 0246 06/06/23 0442 06/07/23 0519 06/08/23 0547  NA 143 143 139 135  K 3.8 3.9 5.4* 4.3  CL 111 112* 107 105  CO2 24 24 26 23   GLUCOSE 139* 125* 261* 238*  BUN 33* 30* 29* 24*  CREATININE 1.44* 1.30* 1.58*  1.25*  CALCIUM 9.6 9.3 9.2 9.1   GFR: Estimated Creatinine Clearance: 40.3 mL/min (A) (by C-G formula based on SCr of 1.25 mg/dL (H)). Liver Function Tests: No results for input(s): "AST", "ALT", "ALKPHOS", "BILITOT", "PROT", "ALBUMIN" in the last 168 hours. CBG: Recent Labs  Lab 06/07/23 1603 06/07/23 2017 06/08/23 0536 06/08/23 0727 06/08/23 1126  GLUCAP 286* 264* 253* 227* 265*    No results found for this or any previous visit (from the past 240 hour(s)).   Radiology Studies: No results found.  Scheduled Meds:  acetaminophen  500 mg Oral Q6H   insulin aspart  0-15 Units Subcutaneous TID WC   insulin glargine-yfgn  10 Units Subcutaneous QHS   lidocaine  1 patch Transdermal Q24H   loratadine  10 mg Oral Daily   mirabegron ER  50 mg Oral Daily   pantoprazole  40 mg Oral Daily   predniSONE  10 mg Oral Q breakfast   pregabalin  50 mg Oral TID   traMADol  50 mg Oral Q8H   traZODone  50 mg Oral QHS   Continuous Infusions:   LOS: 0 days    Time spent:  Debarah Crape, DO [facility]  To contact the attending physician between 7A-7P please use Epic Chat. To contact the covering physician during after hours 7P-7A, please review Amion.   06/08/2023, 4:10 PM

## 2023-06-08 NOTE — Progress Notes (Signed)
   06/08/23 2349  BiPAP/CPAP/SIPAP  Reason BIPAP/CPAP not in use Non-compliant

## 2023-06-08 NOTE — Progress Notes (Signed)
Mobility Specialist: Progress Note   06/08/23 1613  Mobility  Activity Ambulated with assistance in hallway  Level of Assistance Contact guard assist, steadying assist  Assistive Device Front wheel walker  Distance Ambulated (ft) 300 ft  Activity Response Tolerated well  Mobility Referral Yes  $Mobility charge 1 Mobility  Mobility Specialist Start Time (ACUTE ONLY) 1524  Mobility Specialist Stop Time (ACUTE ONLY) 1559  Mobility Specialist Time Calculation (min) (ACUTE ONLY) 35 min    Pt was agreeable to mobility session - received in bed. SV for bed mobility, light minA for STS, CG for ambulation. Had c/o back and hip pain. Returned to room without fault. Left in bed with all needs met, call bell in reach.   Maurene Capes Mobility Specialist Please contact via SecureChat or Rehab office at [phone]

## 2023-06-08 NOTE — Plan of Care (Signed)

## 2023-06-09 DIAGNOSIS — M5459 Other low back pain: Secondary | ICD-10-CM | POA: Diagnosis not present

## 2023-06-09 LAB — URINALYSIS, ROUTINE W REFLEX MICROSCOPIC
Bilirubin Urine: NEGATIVE
Glucose, UA: NEGATIVE mg/dL
Ketones, ur: NEGATIVE mg/dL
Nitrite: POSITIVE — AB
Protein, ur: NEGATIVE mg/dL
Specific Gravity, Urine: 1.01 (ref 1.005–1.030)
WBC, UA: >50 WBC/hpf (ref 0–5)
pH: 6 (ref 5.0–8.0)

## 2023-06-09 LAB — GLUCOSE, CAPILLARY
Glucose-Capillary: 130 mg/dL — ABNORMAL HIGH (ref 70–99)
Glucose-Capillary: 268 mg/dL — ABNORMAL HIGH (ref 70–99)
Glucose-Capillary: 243 mg/dL — ABNORMAL HIGH (ref 70–99)

## 2023-06-09 MED ORDER — HYDROXYZINE HCL 25 MG PO TABS: 25.0000 mg | ORAL_TABLET | Freq: Two times a day (BID) | ORAL | 0 refills | Status: DC | PRN

## 2023-06-09 MED ORDER — PREGABALIN 25 MG PO CAPS: 50.0000 mg | ORAL_CAPSULE | Freq: Three times a day (TID) | ORAL | Status: DC

## 2023-06-09 MED ORDER — POLYETHYLENE GLYCOL 3350 17 G PO PACK: 17.0000 g | PACK | Freq: Every day | ORAL | 0 refills | Status: DC | PRN

## 2023-06-09 MED ORDER — TRAMADOL HCL 50 MG PO TABS: 50.0000 mg | ORAL_TABLET | Freq: Three times a day (TID) | ORAL | 0 refills | Status: DC | PRN

## 2023-06-09 MED ORDER — CEPHALEXIN 500 MG PO CAPS
500.0000 mg | ORAL_CAPSULE | Freq: Two times a day (BID) | ORAL | Status: DC
  Administered 2023-06-09: 500 mg via ORAL
  Filled 2023-06-09: qty 1

## 2023-06-09 MED ORDER — CEPHALEXIN 500 MG PO CAPS: 500.0000 mg | ORAL_CAPSULE | Freq: Two times a day (BID) | ORAL | 0 refills | Status: AC

## 2023-06-09 NOTE — Discharge Summary (Signed)
Physician Discharge Summary  [name] [name] HQI:[id number] DOB: [birth year]  PCP: Excell Seltzer, MD  Admitted from: Home Discharged to: Home  Admit date: 06/06/2023 Discharge date: 06/09/2023  Recommendations for Outpatient Follow-up:    Follow-up Information     Excell Seltzer, MD. Schedule an appointment as soon as possible for a visit in 1 week(s).   Specialty: Family Medicine Why: To be seen with repeat labs (CBC & CMP).  PCP to kindly follow urine culture results that were sent from the hospital. Contact information: [address] [place] [address] [phone]         [facility] Follow up.   Why: home health services will be provided by [facility], start of care within 48 hours post discharge. Questions please call [phone].        Tressie Stalker, MD. Schedule an appointment as soon as possible for a visit.   Specialty: Neurosurgery Contact information: [address] [place] [address] [phone]         Rene Paci, MD. Schedule an appointment as soon as possible for a visit.   Specialty: Urology Contact information: [address] [place] [address] [place] [address] [phone]                  Home Health: Home Health Orders (From admission, onward)     Start     Ordered   06/09/23 1551  Home Health  At discharge       Question Answer Comment  To provide the following care/treatments PT   To provide the following care/treatments OT   To provide the following care/treatments Social work   To provide the following care/treatments Home Health Aide   To provide the following care/treatments RN      06/09/23 1551             Equipment/Devices: None.  Reportedly has all DME's at home.    Discharge Condition: Improved and stable.   Code Status: Full Code Diet recommendation:  Discharge Diet Orders (From admission, onward)     Start     Ordered   06/09/23 0000   Diet - low sodium heart healthy        06/09/23 1555   06/09/23 0000  Diet Carb Modified        06/09/23 1555             Discharge Diagnoses:  Principal Problem:   Intractable low back pain Active Problems:   Poorly controlled type 2 diabetes mellitus (HCC)   HYPERCHOLESTEROLEMIA   Lung nodule   Obstructive sleep apnea   Autoimmune hepatitis (HCC)   Hypertension   Cirrhosis (HCC)   CKD stage 3a, GFR 45-59 ml/min (HCC)   Lumbar radiculopathy   Adjustment disorder with mixed disturbance of emotions and conduct   Acute on chronic low back pain   Brief Summary: 77 y.o. female, lives alone, with history of hypertension, insulin-dependent diabetes, autoimmune hepatitis with cirrhosis, chronic low back pain with lumbar radiculopathy ongoing workup with [facility], and CKD 3A who presents with acute worsening of her low back pain.  Patient reports that she was shopping at [facility] in a motorized scooter and denies any trauma or inciting event but family began having severe low back pain on the right side.  Denies any lower extremity weakness, numbness, fever, chills or change in bowel habits.  She took 2 Lyrica without relief prior to coming to the ED.  On arrival patient was found to have hemodynamically stable vitals, and labs largely consistent with her baseline.  She was admitted for MRI lumbar spine, pain control, PT. Lumbar MRI reveals worsening of foraminal stenosis, prior [facility] MD discussed with neurosurgery on 11/15 who reports no acute intervention necessary at this time.  They will follow-up with patient outpatient.  Patient reports that she will get a second opinion.    Hospitalization was complicated by patient's refusal with various aspects of her treatment plan.  Pain management options are significantly limited due to patient's CKD, cirrhosis, and extensive allergy profile.  After discussion by prior [facility] MD with pharmacist and extensive counseling with the patient it appears  that on 11/16 they found an appropriate pain regimen for her.  Patient also demonstrated significantly labile mood and at times has been aggressive with the medical staff.  Prior [facility] MD had extensive discussion with the patient's daughter, [name], who is also HPOA on 11/16 and she informed that at baseline the patient is more amendable to treatment, and agrees that the patient seems off baseline.  Psychiatry was consulted on 11/16 due to concerns of mild cognitive impairment, aggressive behaviors and nearly constant crying, however patient refused to participate in their evaluation.  On evaluation 11/17 patient appeared significantly improved from prior evaluations.  She did receive trazodone the night prior which was a new medication for her.   Assessment & Plan:   Acute on chronic low back pain History of chronic lumbar radiculopathy followed by Dr. Lovell Sheehan of neurosurgery, reports she has upcoming follow-up appointment with [facility] at the end of the week.  Patient and family are encouraged to keep up this appointment. -MRI this admission reveals slight progression of severe left neural foraminal narrowing at L5-S1, consulted Neurosurgery, Dr. Lovell Sheehan, reports no need for acute intervention and that he will continue to follow with her outpatient -Pain control had been an issue but appears to be controlled on Lyrica 50 mg 3 times daily and tramadol 50 mg every 8 hours.  Patient follows with pain management MD as outpatient who had already planned to increase the Lyrica.  At time of discharge, advised to continue Lyrica 50 Mg 3 times daily, changed tramadol to 50 Mg every 8 hours as needed for moderate to severe pain which she can still continue to take as scheduled.  However given her advanced age, adverse effect profile, minimizing dose including fall risk, prescribed as needed.  Recommend close follow-up with pain management MD.  In the hospital, patient has refused Tylenol, lidocaine patch, other  opioids. -Prior MD has had multiple extensive discussions with the patient regarding the importance of multimodal pain control.   - extensive allergy profile, CKD, and cirrhosis which makes this particularly difficult to manage. -Although therapies recommended SNF, patient insists on discharging home and really does not want to go to SNF.  She states that she wants to go home but her kids wanted to go to SNF.  Discussed extensively with TOC team and based on her performance with the mobility specialist, we ambulated 300 feet yesterday, we feel that it is highly likely that her insurance will not approve SNF.  TOC also indicated that her insurance may take up to couple of days prior to making a decision regarding SNF.  Discussed all of this at length with patient's daughter.  Offered her the option of continued hospital stay while waiting on insurance authorization for SNF but given the possibility that it was likely to be  denied, she discussed with her family and finally made the decision of returning home with home health services.  Discussed with TOC and maximized home health services.  Acute cystitis/recurrent UTI Has dysuria, urinary frequency and urine microscopy concerning for UTI Urine culture added to UA sample.  Culture to be followed up by PCP as outpatient. Prior urine culture results are suggestive of mostly pansensitive bacteria Initiated Keflex 500 mg twice daily x 5 days. Outpatient follow-up with urology.   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   Autoimmune hepatitis Cirrhosis Currently compensated Has been tapered off imuran outpatient.  It appears that patient was already not taking this medication prior to admission. -Cont 10mg  prednisone home med Continue outpatient follow-up with GI   CKD 3A Appears baseline creatinine close to 1.19 as of October 2024,  currently 1.44 on admission Renally dose medications Cr Improving.  Most recent creatinine was 1.25 on 11/17. Avoid nephrotoxic's when possible   Type 2 diabetes A1c 7.9% 07/2022 Patient is on insulin pump at home but does not have it with her at this time.  She was treated with subcutaneous insulins in the hospital.  She is to resume her insulin pump at discharge.  Lung nodules Outpt currently following with Dr. Leonides Schanz of oncology Planning for repeat CT next month   OSA CPAP while sleeping   Hypertension On atenolol outpatient.  This was held in the hospital due to bradycardia in the 50s and soft blood pressures are normal blood pressures.  This is to be closely evaluated during outpatient follow-up with PCP regarding resumption.   Thrombocytopenia Platelets 96K on admission This is secondary to her chronic liver disease.  Will trend on CBC.  No acute intervention warranted at this time. Platelet counts have been relatively stable without evidence of bleeding. Close outpatient follow-up with repeat CBCs.   History of renal cell carcinoma Status post nephrectomy Follows with oncology. CKD management as above   BMI 35 Needs outpatient follow-up for weight loss counseling and risk modification.   Patient nonadherence to treatment plan Mood lability Unclear if underlying MCI or just acute delirium but will cont frequent reorientation and monitoring. Consulted Psych but patient has refused evaluation. Cont with chaplain for emotional support RPN Improved and cooperative to evaluation and discussion today.    Consultants:  Neurosurgery   Procedures:  N/a    Discharge Instructions  Discharge Instructions     Call MD for:  difficulty breathing, headache or visual disturbances   Complete by: As directed    Call MD for:  extreme fatigue   Complete by: As directed    Call MD for:  persistant dizziness or light-headedness   Complete by: As directed    Call MD for:   persistant nausea and vomiting   Complete by: As directed    Call MD for:  severe uncontrolled pain   Complete by: As directed    Call MD for:  temperature >100.4   Complete by: As directed    Diet - low sodium heart healthy   Complete by: As directed    Diet Carb Modified   Complete by: As directed    Increase activity slowly   Complete by: As directed         Medication List     STOP taking these medications    atenolol 25 MG tablet Commonly known as: TENORMIN   azaTHIOprine  50 MG tablet Commonly known as: IMURAN   ciprofloxacin 500 MG tablet Commonly known as: CIPRO       TAKE these medications    albuterol 108 (90 Base) MCG/ACT inhaler Commonly known as: ProAir HFA Inhale 1-2 puffs into the lungs every 6 (six) hours as needed for wheezing or shortness of breath. What changed: how much to take   cephALEXin 500 MG capsule Commonly known as: KEFLEX Take 1 capsule (500 mg total) by mouth 2 (two) times daily for 9 doses.   cetirizine 10 MG tablet Commonly known as: ZYRTEC Take 10 mg by mouth daily.   Coenzyme Q10 100 MG Tabs Take 100 mg by mouth daily.   CRANBERRY PO Take 2 tablets by mouth daily.   dicyclomine 20 MG tablet Commonly known as: BENTYL Take 1 tablet (20 mg total) by mouth 2 (two) times daily. What changed:  when to take this reasons to take this   hydrOXYzine 25 MG tablet Commonly known as: ATARAX Take 1 tablet (25 mg total) by mouth 2 (two) times daily as needed for anxiety (irritability).   insulin lispro 100 UNIT/ML injection Commonly known as: HUMALOG Inject 50 Units into the skin daily. Per [employer] insulin pump   Myrbetriq 50 MG Tb24 tablet Generic drug: mirabegron ER Take 50 mg by mouth daily.   pantoprazole 40 MG tablet Commonly known as: PROTONIX TAKE 1 TABLET BY MOUTH EVERY DAY   polyethylene glycol 17 g packet Commonly known as: MIRALAX / GLYCOLAX Take 17 g by mouth daily as needed for mild constipation.    predniSONE 10 MG tablet Commonly known as: DELTASONE Take 10 mg by mouth daily with breakfast.   pregabalin 25 MG capsule Commonly known as: LYRICA Take 2 capsules (50 mg total) by mouth 3 (three) times daily. This was recently prescribed by your office MD on 05/29/2023. Please follow up in office for refills.  Dose has been increased to 2 capsules (50 mg total) 3 times daily. What changed:  how much to take additional instructions   traMADol 50 MG tablet Commonly known as: ULTRAM Take 1 tablet (50 mg total) by mouth every 8 (eight) hours as needed for moderate pain (pain score 4-6) or severe pain (pain score 7-10).   UNABLE TO FIND Take 1 tablet by mouth in the morning and at bedtime. Med Name: Cory Roughen Vitamin       Allergies  Allergen Reactions   Codeine Other (See Comments)    Unknown    Doxycycline Hives   Epinephrine Hcl Other (See Comments)    Had tremors when it was given.   Erythromycin Ethylsuccinate Other (See Comments)    Abdominal spasms   Eucalyptus Oil Other (See Comments)    Unknown    Iohexol Hives    Pt needs premedicated-- hives on prev contrast study per md office    Menthol Other (See Comments)    Unknown    Metformin Diarrhea   Oxycodone Other (See Comments)    "felt like I was dying"   Tape Rash      Procedures/Studies: MR LUMBAR SPINE WO CONTRAST  Result Date: 06/06/2023 CLINICAL DATA:  Low back pain, symptoms persist with > 6 wks treatment EXAM: MRI LUMBAR SPINE WITHOUT CONTRAST TECHNIQUE: Multiplanar, multisequence MR imaging of the lumbar spine was performed. No intravenous contrast was administered. COMPARISON:  Lumbar spine MRI 05/24/2022 FINDINGS: Segmentation:  Standard. Alignment:  Physiologic. Vertebrae:  No fracture, evidence of discitis, or bone lesion. Conus medullaris and cauda  equina: Conus extends to the L1 level. Conus and cauda equina appear normal. Paraspinal and other soft tissues: Status post left-sided nephrectomy. There  is a small splenule adjacent to the spleen (series 5, image 6). There is likely also a 1.3 x 0.9 cm splenic artery aneurysm (series 5, image 5). 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 No spinal canal or neural foraminal stenosis. L1-L2: Mild bilateral facet degenerative change. Minimal disc bulge. No spinal canal narrowing. No neural foraminal narrowing. L2-L3: Moderate bilateral facet degenerative change. Short pedicles. Circumferential disc bulge. Moderate spinal canal narrowing. Mild bilateral neural foraminal narrowing, right-greater-than-left. Findings are unchanged from prior exam L3-L4: Moderate bilateral facet degenerative change. Ligamentum flavum hypertrophy. Circumferential disc bulge. Short pedicles. Severe spinal canal narrowing. Moderate bilateral neural foraminal narrowing. Findings are unchanged from prior exam. L4-L5: Moderate bilateral facet degenerative change. Short pedicles. Circumferential disc bulge. Moderate spinal canal narrowing. Severe left and moderate right neural foraminal narrowing. Findings are unchanged from prior exam L5-S1: Moderate bilateral facet degenerative change. Circumferential disc bulge. Mild spinal canal narrowing. Severe left and moderate right neural foraminal narrowing. Findings have slightly progressed on the left. IMPRESSION: 1. Slight progression of severe left neural foraminal narrowing at L5-S1. 2. Otherwise, no significant interval change in multilevel degenerative changes of the lumbar spine, as described above. 3. Likely 1.3 x 0.9 cm splenic artery aneurysm. Recommend further evaluation with a dedicated CTA of the abdomen. Electronically Signed   By: Lorenza Cambridge M.D.   On: 06/06/2023 14:39   DG HIP UNILAT WITH PELVIS 2-3 VIEWS RIGHT  Result Date: 06/06/2023 CLINICAL DATA:  Pain EXAM: DG HIP (WITH OR WITHOUT PELVIS) 2-3V RIGHT COMPARISON:  None Available. FINDINGS: No fracture  or dislocation is seen. Bilateral hip joint spaces are preserved. Visualized bony pelvis appears intact. Mild degenerative changes of the lower lumbar spine. IMPRESSION: Negative. Electronically Signed   By: Charline Bills M.D.   On: 06/06/2023 01:17      Subjective: Patient was interviewed and and along with her female RN in the room.  Patient did not report pain.  She did report dysuria and urinary frequency up to 3-4 times last night.  Gives history of recurrent UTIs and completed a course of antibiotics about 2 to 3 weeks ago.  Follows with outpatient urology.  No blood in the urine.  No fever.  Feels cold at times which appears to be chronic.  Discharge Exam:  Vitals:   06/08/23 1957 06/09/23 0333 06/09/23 0848 06/09/23 1520  BP: (!) 116/45 (!) 123/50 117/88 (!) 156/53  Pulse: (!) 56 (!) 49 (!) 56 62  Resp: 17 14 18 18   Temp: 98.3 F (36.8 C) 98.4 F (36.9 C) 98.1 F (36.7 C) 98.6 F (37 C)  TempSrc: Oral  Oral Oral  SpO2: 95% 96% 96% 97%  Weight:      Height:        General: Elderly female, moderately built and obese sitting upright in chair without distress.  Had prior history in front of her which she had completed 100% of. Cardiovascular: S1 & S2 heard, RRR, S1/S2 +. No murmurs, rubs, gallops or clicks. No JVD or pedal edema.  Not on telemetry. Respiratory: Clear to auscultation without wheezing, rhonchi or crackles. No increased work of breathing. Abdominal:  Non distended, non tender & soft. No organomegaly or masses appreciated. Normal bowel sounds heard. CNS: Alert and oriented. No focal deficits. Extremities: no edema, no cyanosis  Psychiatric: Pleasant and cooperative.  Possible some degree of cognitive impairment and lack of insight.    The results of significant diagnostics from this hospitalization (including imaging, microbiology, ancillary and laboratory) are listed below for reference.     Microbiology: No results found for this or any previous visit  (from the past 240 hour(s)).   Labs: CBC: Recent Labs  Lab 06/06/23 0246 06/06/23 0442 06/07/23 0519 06/08/23 0547  WBC 7.8 6.9 5.3 5.5  NEUTROABS 5.5  --   --   --   HGB 13.3 13.1 12.4 12.0  HCT 39.5 39.6 37.8 36.6  MCV 94.0 95.0 96.4 94.8  PLT 96* 91* 83* 76*    Basic Metabolic Panel: Recent Labs  Lab 06/06/23 0246 06/06/23 0442 06/07/23 0519 06/08/23 0547  NA 143 143 139 135  K 3.8 3.9 5.4* 4.3  CL 111 112* 107 105  CO2 24 24 26 23   GLUCOSE 139* 125* 261* 238*  BUN 33* 30* 29* 24*  CREATININE 1.44* 1.30* 1.58* 1.25*  CALCIUM 9.6 9.3 9.2 9.1    Liver Function Tests: No results for input(s): "AST", "ALT", "ALKPHOS", "BILITOT", "PROT", "ALBUMIN" in the last 168 hours.  CBG: Recent Labs  Lab 06/08/23 1126 06/08/23 1639 06/08/23 2114 06/09/23 0742 06/09/23 1243  GLUCAP 265* 324* 148* 130* 268*     Urinalysis    Component Value Date/Time   COLORURINE YELLOW 06/09/2023 1231   APPEARANCEUR CLOUDY (A) 06/09/2023 1231   LABSPEC 1.010 06/09/2023 1231   PHURINE 6.0 06/09/2023 1231   GLUCOSEU NEGATIVE 06/09/2023 1231   HGBUR MODERATE (A) 06/09/2023 1231   HGBUR moderate 03/28/2010 0931   BILIRUBINUR NEGATIVE 06/09/2023 1231   BILIRUBINUR Negative 09/21/2021 1614   KETONESUR NEGATIVE 06/09/2023 1231   PROTEINUR NEGATIVE 06/09/2023 1231   UROBILINOGEN 0.2 09/21/2021 1614   UROBILINOGEN 0.2 03/28/2010 0931   NITRITE POSITIVE (A) 06/09/2023 1231   LEUKOCYTESUR LARGE (A) 06/09/2023 1231    I had multiple discussions with patient's daughter via phone, updated care and answered all questions.  Time coordinating discharge: 45 minutes  SIGNED:  Marcellus Scott, MD,  FACP, [facility], [facility], [facility]   [facility] [place]     To contact the attending provider between 7A-7P or the covering provider during after hours 7P-7A, please log into the web site [email or web address] and access using universal [place] password for that web  site. If you do not have the password, please call the hospital operator.

## 2023-06-09 NOTE — Progress Notes (Signed)
Occupational Therapy Treatment Patient Details Name: [name] [name] MRN: [id number] DOB: [birth year] Today's Date: 06/09/2023   History of present illness 77 y.o. female presents to [facility] 06/06/23 w/ acute worsening of chronic LBP. PMHx: HTN, DM, autoimmunie hepatitis, cirrhosis, chronic LBP w/ lumbar radiculopathy, CKD, obesity   OT comments  Patient in bed, agreeable to OT.  Supervision for bed mobility, transfers and mobility using RW with intermittent cueing for safety and hand placement.  She is able to complete grooming seated at sink with supervision, but typically completes standing (unable due to back discomfort).  She scored 8/28 on short blessed test, questionable impairment and would benefit from higher level cognitive assessment using pill box test.  At this time would recommend 24/7 support at dc. Based on performance today, believe patient will best benefit from continued OT services acutely and after dc at inpatient setting with <3hrs/day to optimize independence, safety with ADLs and mobility.        If plan is discharge home, recommend the following:  A little help with walking and/or transfers;A little help with bathing/dressing/bathroom;Assistance with cooking/housework;Assist for transportation;Help with stairs or ramp for entrance   Equipment Recommendations  BSC/3in1    Recommendations for Other Services      Precautions / Restrictions Precautions Precautions: Fall Restrictions Weight Bearing Restrictions: No       Mobility Bed Mobility Overal bed mobility: Needs Assistance Bed Mobility: Rolling, Sidelying to Sit Rolling: Supervision Sidelying to sit: Supervision       General bed mobility comments: using rails with HOB elevated but overall supervision, no physical assist required    Transfers Overall transfer level: Needs assistance Equipment used: Rolling [name] (2 wheels) Transfers: Sit to/from Stand Sit to Stand: Supervision           General  transfer comment: cueing for hand placement and safety     Balance Overall balance assessment: Needs assistance Sitting-balance support: No upper extremity supported, Feet supported Sitting balance-Leahy Scale: Good     Standing balance support: Bilateral upper extremity supported, During functional activity, No upper extremity supported Standing balance-Leahy Scale: Fair Standing balance comment: relies on RW dynamically                           ADL either performed or assessed with clinical judgement   ADL Overall ADL's : Needs assistance/impaired     Grooming: Wash/dry hands;Wash/dry face;Oral care;Brushing hair;Set up;Sitting Grooming Details (indicate cue type and reason): pt requesting to complete sitting at sink due to back pain             Lower Body Dressing: Contact guard assist;Sit to/from stand Lower Body Dressing Details (indicate cue type and reason): figure 4 for sock mgmt, increased time Toilet Transfer: Supervision/safety;Ambulation;Rolling [name] (2 wheels)           Functional mobility during ADLs: Supervision/safety;Rolling [name] (2 wheels)      Extremity/Trunk Assessment              Vision       Perception     Praxis      Cognition Arousal: Alert Behavior During Therapy: WFL for tasks assessed/performed Overall Cognitive Status: No family/caregiver present to determine baseline cognitive functioning Area of Impairment: Attention, Problem solving, Memory                   Current Attention Level: Sustained, Selective Memory: Decreased short-term memory       Problem Solving: Slow  processing, Requires verbal cues, Difficulty sequencing General Comments: short blessed test scoring 8/28 with deficits in recall, attention and sequencing.  Patient would benefit from pill box test to assess higher level cognition        Exercises      Shoulder Instructions       General Comments      Pertinent Vitals/  Pain       Pain Assessment Pain Assessment: Faces Faces Pain Scale: Hurts a little bit Pain Location: LBP Pain Descriptors / Indicators: Aching, Discomfort, Grimacing, Guarding Pain Intervention(s): Limited activity within patient's tolerance, Monitored during session, Repositioned  Home Living                                          Prior Functioning/Environment              Frequency  Min 1X/week        Progress Toward Goals  OT Goals(current goals can now be found in the care plan section)  Progress towards OT goals: Progressing toward goals  Acute Rehab OT Goals Patient Stated Goal: get better OT Goal Formulation: With patient Time For Goal Achievement: 06/20/23 Potential to Achieve Goals: Good  Plan      Co-evaluation                 AM-PAC OT "6 Clicks" Daily Activity     Outcome Measure   Help from another person eating meals?: None Help from another person taking care of personal grooming?: A Little Help from another person toileting, which includes using toliet, bedpan, or urinal?: A Little Help from another person bathing (including washing, rinsing, drying)?: A Little Help from another person to put on and taking off regular upper body clothing?: A Little Help from another person to put on and taking off regular lower body clothing?: A Little 6 Click Score: 19    End of Session Equipment Utilized During Treatment: Gait belt;Rolling [name] (2 wheels)  OT Visit Diagnosis: Other abnormalities of gait and mobility (R26.89);Muscle weakness (generalized) (M62.81);Pain Pain - Right/Left: Right Pain - part of body:  (back)   Activity Tolerance Patient tolerated treatment well   Patient Left in chair;with call bell/phone within reach;with chair alarm set   Nurse Communication Mobility status        Time: 1610-9604 OT Time Calculation (min): 34 min  Charges: OT General Charges $OT Visit: 1 Visit OT Treatments $Self  Care/Home Management : 23-37 mins  Barry Brunner, OT Acute Rehabilitation Services Office [phone]   Chancy Milroy 06/09/2023, 10:10 AM

## 2023-06-09 NOTE — Discharge Instructions (Signed)

## 2023-06-09 NOTE — TOC Transition Note (Signed)
Transition of Care [facility]) - CM/SW Discharge Note   Patient Details  Name: [name] [name] MRN: [id number] Date of Birth: [birth year]  Transition of Care [facility]) CM/SW Contact:  [name] Lesches, RN Phone Number: 06/09/2023, 3:53 PM   Clinical Narrative:    Patient will DC to: home Anticipated DC date: 06/09/2023 Family notified: yes Transport by: car  Presented with acute worsening of chronic LBP. Per MD patient ready for DC today. RN, patient, patient's daughter [name] [name], and  [name] [name]/ [facility] notified of DC. Pt without DME needs. Post hospital f/u noted on AVS. Pt to  pickup RX meds from local pharmacy. Daughter to provide transportation to home.  RNCM will sign off for now as intervention is no longer needed. Please consult [place] again if new needs arise.    Final next level of care: Home w Home Health Services Barriers to Discharge: No Barriers Identified   Patient Goals and CMS Choice CMS Medicare.gov Compare Post Acute Care list provided to:: Patient Choice offered to / list presented to : Patient  Discharge Placement                         Discharge Plan and Services Additional resources added to the After Visit Summary for   In-house Referral: Clinical Social Work Discharge Planning Services: CM Consult Post Acute Care Choice: Skilled Nursing Facility                    HH Arranged: RN, PT, OT, Nurse's Aide, Social Work [employer] Agency: [facility] Date HH Agency Contacted: 06/09/23 Time HH Agency Contacted: 1553 Representative spoke with at [facility] Agency: Morrie Sheldon  Social Determinants of Health (SDOH) Interventions SDOH Screenings   Food Insecurity: No Food Insecurity (06/06/2023)  Housing: Low Risk  (06/06/2023)  Transportation Needs: No Transportation Needs (06/06/2023)  Utilities: Not At Risk (06/06/2023)  Depression (PHQ2-9): Low Risk  (01/14/2022)  Financial Resource Strain: Low Risk  (01/14/2022)  Physical Activity: Inactive  (01/14/2022)  Stress: No Stress Concern Present (01/14/2022)  Tobacco Use: Low Risk  (06/06/2023)     Readmission Risk Interventions     No data to display

## 2023-06-09 NOTE — Progress Notes (Signed)
Mobility Specialist: Progress Note   06/09/23 1543  Mobility  Activity Ambulated with assistance in hallway  Level of Assistance Contact guard assist, steadying assist  Assistive Device Front wheel walker  Distance Ambulated (ft) 75 ft  Activity Response Tolerated well  Mobility Referral Yes  $Mobility charge 1 Mobility  Mobility Specialist Start Time (ACUTE ONLY) 1454  Mobility Specialist Stop Time (ACUTE ONLY) 1507  Mobility Specialist Time Calculation (min) (ACUTE ONLY) 13 min    Pt was agreeable to mobility session - received in bed. Had c/o of bil hip pain. SV for bed mobility, CG for STS and ambulation. Returned to room without fault. Left in bed with all needs met, call bell in reach. NT in room.   Maurene Capes Mobility Specialist Please contact via SecureChat or Rehab office at [phone]

## 2023-06-09 NOTE — Progress Notes (Signed)
Physical Therapy Treatment Patient Details Name: [name] [name] MRN: [id number] DOB: 20-Mar-1946 Today's Date: 06/09/2023   History of Present Illness 77 y.o. female presents to [facility] 06/06/23 w/ acute worsening of chronic LBP. PMHx: HTN, DM, autoimmunie hepatitis, cirrhosis, chronic LBP w/ lumbar radiculopathy, CKD, obesity    PT Comments  Pt received in recliner, agreeable to therapy session with emphasis on gait and transfer safety. Pt able to perform curb step 4" tall with CGA for safety and gait with up to CGA. Pt with decreased safety awareness and short term memory deficit. Note plan for DC home with a ride from family, PTA encouraged her to have initial supervision/assist from family once home to ensure her safety and to help set up for medication mgmt and ensure decreased fall risk, pt not receptive. Pt asking about life alert button as a bracelet, no handout available in PT gym so website with recommended options printed for her, link below, so pt can discuss with family and get assist to order something like this. Pt continues to benefit from PT services to progress toward functional mobility goals, continue to recommend initial constant supervision/assist for pt safety once home, and to try to arrange for increased aide support since per pt her children work during the day, recommendation from PT for short term lower intensity post-acute rehab remains appropriate if increased supervision/assist from family not available.    If plan is discharge home, recommend the following: A little help with walking and/or transfers;A little help with bathing/dressing/bathroom;Assist for transportation   Can travel by private vehicle     Yes (however pt states family cannot transport her)  Equipment Recommendations  None recommended by PT    Recommendations for Other Services       Precautions / Restrictions Precautions Precautions: Fall Restrictions Weight Bearing Restrictions: No      Mobility  Bed Mobility Overal bed mobility: Needs Assistance             General bed mobility comments: pt received/remains in chair    Transfers Overall transfer level: Needs assistance Equipment used: Rolling walker (2 wheels) Transfers: Sit to/from Stand Sit to Stand: Supervision           General transfer comment: cueing for hand placement and safety    Ambulation/Gait Ambulation/Gait assistance: Contact guard assist, Supervision Gait Distance (Feet): 200 Feet Assistive device: Rolling walker (2 wheels) Gait Pattern/deviations: Step-through pattern, Trunk flexed       General Gait Details: Some c/o RLE pain with shortened step on left to compensate but step-through pattern. Pt reports pain increases as distance increased. Pt often holding RW outside BOS, needs intermittent cues to stand closer to RW and for forward gaze. No buckling or LOB, decreased bil dorsiflexion and heel strike.   Stairs Stairs: Yes Stairs assistance: Contact guard assist Stair Management: Step to pattern, Forwards, With walker Number of Stairs: 1 General stair comments: single 4" curb step with RW, no buckling or LOB, cues for safety. Pt states "sometimes I go in front door down a step, sometimes I go up/down the ramp".   Wheelchair Mobility     Tilt Bed    Modified Rankin (Stroke Patients Only)       Balance Overall balance assessment: Needs assistance Sitting-balance support: No upper extremity supported, Feet supported Sitting balance-Leahy Scale: Good     Standing balance support: Bilateral upper extremity supported, During functional activity, No upper extremity supported Standing balance-Leahy Scale: Fair Standing balance comment: with RW fair to good  Cognition Arousal: Alert Behavior During Therapy: WFL for tasks assessed/performed Overall Cognitive Status: No family/caregiver present to determine baseline cognitive  functioning Area of Impairment: Attention, Problem solving, Memory                   Current Attention Level: Sustained, Selective Memory: Decreased short-term memory       Problem Solving: Slow processing, Requires verbal cues, Difficulty sequencing General Comments: Pt with some decreased safety awareness with transfers/RW use, needs min cues for safety. PTA reinforced with pt need for increased supervision/assist at home, pt agreeble to look into a life alert button.        Exercises      General Comments General comments (skin integrity, edema, etc.): no acute s/sx distress; decreased awareness of safety and often setting off chair alarm before/after session. Pt needed reminder that her call bell is in the chair next to her hip as she was unable to locate it. Pt also looking all through her bags for her bra, which was next to her chair on the floor. (https://www.helpguide.org/handbook/medical-alert-systems/best-medical-alert-bracelets) info printed for pt as no official handout for life alert type button was found in Clinical References section.      Pertinent Vitals/Pain Pain Assessment Pain Assessment: Faces Faces Pain Scale: Hurts a little bit Pain Location: LBP Pain Descriptors / Indicators: Discomfort, Sore Pain Intervention(s): Monitored during session, Repositioned    Home Living                          Prior Function            PT Goals (current goals can now be found in the care plan section) Acute Rehab PT Goals Patient Stated Goal: to get better, less pain PT Goal Formulation: With patient Time For Goal Achievement: 06/20/23 Progress towards PT goals: Progressing toward goals    Frequency    Min 1X/week      PT Plan      Co-evaluation              AM-PAC PT "6 Clicks" Mobility   Outcome Measure  Help needed turning from your back to your side while in a flat bed without using bedrails?: A Little Help needed moving from  lying on your back to sitting on the side of a flat bed without using bedrails?: A Little Help needed moving to and from a bed to a chair (including a wheelchair)?: A Little Help needed standing up from a chair using your arms (e.g., wheelchair or bedside chair)?: A Little Help needed to walk in hospital room?: A Little Help needed climbing 3-5 steps with a railing? : A Lot 6 Click Score: 17    End of Session Equipment Utilized During Treatment: Gait belt Activity Tolerance: Patient tolerated treatment well Patient left: in chair;with call bell/phone within reach;with chair alarm set Nurse Communication: Mobility status PT Visit Diagnosis: Unsteadiness on feet (R26.81);Muscle weakness (generalized) (M62.81)     Time: 4696-2952 PT Time Calculation (min) (ACUTE ONLY): 18 min  Charges:    $Gait Training: 8-22 mins PT General Charges $$ ACUTE PT VISIT: 1 Visit                     Jatziri Goffredo P., PTA Acute Rehabilitation Services Secure Chat Preferred 9a-5:30pm Office: [phone]    Dorathy Kinsman [facility] 06/09/2023, 6:21 PM

## 2023-06-09 NOTE — Inpatient Diabetes Management (Signed)
Inpatient Diabetes Program Recommendations  AACE/ADA: New Consensus Statement on Inpatient Glycemic Control (2015)  Target Ranges:  Prepandial:   less than 140 mg/dL      Peak postprandial:   less than 180 mg/dL (1-2 hours)      Critically ill patients:  140 - 180 mg/dL   Lab Results  Component Value Date   GLUCAP 130 (H) 06/09/2023   HGBA1C 7.9 (H) 08/05/2022    Review of Glycemic Control  Latest Reference Range & Units 06/08/23 07:27 06/08/23 11:26 06/08/23 16:39 06/08/23 21:14 06/09/23 07:42  Glucose-Capillary 70 - 99 mg/dL 644 (H) 034 (H) 742 (H) 148 (H) 130 (H)  (H): Data is abnormally high  Diabetes history: DM2  Outpatient Diabetes medications:  Medtronic insulin pump with the Guardian CGM using Humalog Basal-12a-6a-1.3 units/hr             6a-9p-1.65 units/hr             9p-12a-1.3 units/hr Insulin carb ratio-1 unit for every 7.5 carbs Insulin sensitivity factor: 1 units drops her 30 mg/dL Target BG 595-638 Active insulin time is 3 hrs  Current orders for Inpatient glycemic control:  Semglee 10 units at bedtime Novolog 0-15 units TID Prednisone 5 mg QD  Inpatient Diabetes Program Recommendations:    If postprandials are elevated might consider:  Novolog 3 units TID with meals if she consumes at least 50%  Spoke with patient at bedside.  Her insulin pump and CGM were removed for imaging when she came to the hospital and were taken home.  She started insulin pump therapy 6 months ago.  She does not know her settings.  Called and spoke with her endocrinologist, Dr. Talmage Nap and she provided me with the above pump settings.  Her last visit with Dr. Talmage Nap was 05/27/23.  Will update med rec as there is not insulin listed.    Will continue to follow while inpatient.  Thank you, Dulce Sellar, MSN, CDCES Diabetes Coordinator Inpatient Diabetes Program [phone] (team pager from 8a-5p)

## 2023-06-09 NOTE — Progress Notes (Addendum)
Discharge instructions given. Patient verbalized understanding and all questions were answered.   RN updated patient's daughter about discharge instructions.

## 2023-06-09 NOTE — TOC Progression Note (Signed)
Transition of Care [facility]) - Progression Note    Patient Details  Name: [name] [name] MRN: [id number] Date of Birth: 12-08-1945  [facility]) CM/SW Contact  Lorri Frederick, LCSW Phone Number: 06/09/2023, 8:48 AM  Clinical Narrative:   PASSR received: [id number]    Expected Discharge Plan: Skilled Nursing Facility Barriers to Discharge: Continued Medical Work up, SNF Pending bed offer  Expected Discharge Plan and Services In-house Referral: Clinical Social Work   Post Acute Care Choice: Skilled Nursing Facility Living arrangements for the past 2 months: Single Family Home                                       Social Determinants of Health (SDOH) Interventions SDOH Screenings   Food Insecurity: No Food Insecurity (06/06/2023)  Housing: Low Risk  (06/06/2023)  Transportation Needs: No Transportation Needs (06/06/2023)  Utilities: Not At Risk (06/06/2023)  Depression (PHQ2-9): Low Risk  (01/14/2022)  Financial Resource Strain: Low Risk  (01/14/2022)  Physical Activity: Inactive (01/14/2022)  Stress: No Stress Concern Present (01/14/2022)  Tobacco Use: Low Risk  (06/06/2023)    Readmission Risk Interventions     No data to display

## 2023-06-09 NOTE — TOC Progression Note (Signed)
Transition of Care [facility]) - Progression Note    Patient Details  Name: [name] [name] MRN: [id number] Date of Birth: 05-28-1946  Transition of Care [facility]) CM/SW Contact  Epifanio Lesches, RN Phone Number: 06/09/2023, 10:34 AM  Clinical Narrative:    Pt states will transition to home once d/c. States lives alone. Supportive daughter. States receives assistance from [employer] once a week.  Agreeable to home health services. Pt without provider preference. Referral made with [facility] and  accepted. Pt without DME needs. States already has RW and W/C  @ home. Daughter to provide transportation to home once d/c ready.  TOC following and will assist with needs....    Expected Discharge Plan: Home w Home Health Services Barriers to Discharge: Continued Medical Work up  Expected Discharge Plan and Services In-house Referral: Clinical Social Work Discharge Planning Services: CM Consult Post Acute Care Choice: Skilled Nursing Facility Living arrangements for the past 2 months: Single Family Home                           HH Arranged: PT, OT, Social Work [employer] Agency: [facility] ([facility]) Date HH Agency Contacted: 06/09/23 Time HH Agency Contacted: 1016 Representative spoke with at [facility] Agency: Morrie Sheldon   Social Determinants of Health (SDOH) Interventions SDOH Screenings   Food Insecurity: No Food Insecurity (06/06/2023)  Housing: Low Risk  (06/06/2023)  Transportation Needs: No Transportation Needs (06/06/2023)  Utilities: Not At Risk (06/06/2023)  Depression (PHQ2-9): Low Risk  (01/14/2022)  Financial Resource Strain: Low Risk  (01/14/2022)  Physical Activity: Inactive (01/14/2022)  Stress: No Stress Concern Present (01/14/2022)  Tobacco Use: Low Risk  (06/06/2023)    Readmission Risk Interventions     No data to display

## 2023-06-09 NOTE — Plan of Care (Signed)
  Problem: Education: Goal: Knowledge of General Education information will improve Description: Including pain rating scale, medication(s)/side effects and non-pharmacologic comfort measures Outcome: Progressing   Problem: Health Behavior/Discharge Planning: Goal: Ability to manage health-related needs will improve Outcome: Progressing   Problem: Clinical Measurements: Goal: Ability to maintain clinical measurements within normal limits will improve Outcome: Progressing   Problem: Pain Management: Goal: General experience of comfort will improve Outcome: Progressing

## 2023-06-11 ENCOUNTER — Other Ambulatory Visit: Payer: Self-pay | Admitting: Family Medicine

## 2023-06-11 DIAGNOSIS — M150 Primary generalized (osteo)arthritis: Secondary | ICD-10-CM | POA: Diagnosis not present

## 2023-06-11 DIAGNOSIS — E1142 Type 2 diabetes mellitus with diabetic polyneuropathy: Secondary | ICD-10-CM | POA: Diagnosis not present

## 2023-06-11 DIAGNOSIS — M1990 Unspecified osteoarthritis, unspecified site: Secondary | ICD-10-CM | POA: Diagnosis not present

## 2023-06-11 DIAGNOSIS — F419 Anxiety disorder, unspecified: Secondary | ICD-10-CM | POA: Diagnosis not present

## 2023-06-11 DIAGNOSIS — N390 Urinary tract infection, site not specified: Secondary | ICD-10-CM | POA: Diagnosis not present

## 2023-06-11 DIAGNOSIS — E669 Obesity, unspecified: Secondary | ICD-10-CM | POA: Diagnosis not present

## 2023-06-11 DIAGNOSIS — F4325 Adjustment disorder with mixed disturbance of emotions and conduct: Secondary | ICD-10-CM | POA: Diagnosis not present

## 2023-06-11 DIAGNOSIS — E1122 Type 2 diabetes mellitus with diabetic chronic kidney disease: Secondary | ICD-10-CM | POA: Diagnosis not present

## 2023-06-11 DIAGNOSIS — Z6835 Body mass index (BMI) 35.0-35.9, adult: Secondary | ICD-10-CM | POA: Diagnosis not present

## 2023-06-11 DIAGNOSIS — I130 Hypertensive heart and chronic kidney disease with heart failure and stage 1 through stage 4 chronic kidney disease, or unspecified chronic kidney disease: Secondary | ICD-10-CM | POA: Diagnosis not present

## 2023-06-11 DIAGNOSIS — E7800 Pure hypercholesterolemia, unspecified: Secondary | ICD-10-CM | POA: Diagnosis not present

## 2023-06-11 DIAGNOSIS — N200 Calculus of kidney: Secondary | ICD-10-CM | POA: Diagnosis not present

## 2023-06-11 DIAGNOSIS — F32A Depression, unspecified: Secondary | ICD-10-CM | POA: Diagnosis not present

## 2023-06-11 DIAGNOSIS — Z85528 Personal history of other malignant neoplasm of kidney: Secondary | ICD-10-CM | POA: Diagnosis not present

## 2023-06-11 DIAGNOSIS — D696 Thrombocytopenia, unspecified: Secondary | ICD-10-CM | POA: Diagnosis not present

## 2023-06-11 DIAGNOSIS — Z794 Long term (current) use of insulin: Secondary | ICD-10-CM | POA: Diagnosis not present

## 2023-06-11 DIAGNOSIS — M545 Low back pain, unspecified: Secondary | ICD-10-CM | POA: Diagnosis not present

## 2023-06-11 DIAGNOSIS — I509 Heart failure, unspecified: Secondary | ICD-10-CM | POA: Diagnosis not present

## 2023-06-11 DIAGNOSIS — I728 Aneurysm of other specified arteries: Secondary | ICD-10-CM | POA: Diagnosis not present

## 2023-06-11 DIAGNOSIS — Z7952 Long term (current) use of systemic steroids: Secondary | ICD-10-CM | POA: Diagnosis not present

## 2023-06-11 DIAGNOSIS — K7460 Unspecified cirrhosis of liver: Secondary | ICD-10-CM | POA: Diagnosis not present

## 2023-06-11 DIAGNOSIS — R911 Solitary pulmonary nodule: Secondary | ICD-10-CM | POA: Diagnosis not present

## 2023-06-11 DIAGNOSIS — J45909 Unspecified asthma, uncomplicated: Secondary | ICD-10-CM | POA: Diagnosis not present

## 2023-06-11 DIAGNOSIS — N1831 Chronic kidney disease, stage 3a: Secondary | ICD-10-CM | POA: Diagnosis not present

## 2023-06-11 DIAGNOSIS — K754 Autoimmune hepatitis: Secondary | ICD-10-CM | POA: Diagnosis not present

## 2023-06-11 DIAGNOSIS — G4733 Obstructive sleep apnea (adult) (pediatric): Secondary | ICD-10-CM | POA: Diagnosis not present

## 2023-06-11 DIAGNOSIS — M5416 Radiculopathy, lumbar region: Secondary | ICD-10-CM | POA: Diagnosis not present

## 2023-06-11 LAB — URINE CULTURE: Culture: >=100000 — AB

## 2023-06-11 NOTE — Telephone Encounter (Signed)
Last office visit 09/21/2021.   Last refilled 06/09/2023 for #10 with no refills by Dr. Waymon Amato.  Next Appt: 06/13/23 for hospital follow up.

## 2023-06-11 NOTE — Telephone Encounter (Signed)
Prescription Request  06/11/2023  LOV: Visit date not found  What is the name of the medication or equipment? hydrOXYzine (ATARAX) 25 MG tablet   Have you contacted your pharmacy to request a refill? No   Which pharmacy would you like this sent to?  [id number] [place], [place] - [address] [place] [address] Phone: [phone] Fax: [phone]    Patient notified that their request is being sent to the clinical staff for review and that they should receive a response within 2 business days.   Please advise at Mobile [phone] (mobile)

## 2023-06-12 DIAGNOSIS — M25552 Pain in left hip: Secondary | ICD-10-CM | POA: Diagnosis not present

## 2023-06-12 DIAGNOSIS — M25551 Pain in right hip: Secondary | ICD-10-CM | POA: Diagnosis not present

## 2023-06-12 DIAGNOSIS — M48061 Spinal stenosis, lumbar region without neurogenic claudication: Secondary | ICD-10-CM | POA: Diagnosis not present

## 2023-06-12 DIAGNOSIS — M160 Bilateral primary osteoarthritis of hip: Secondary | ICD-10-CM | POA: Diagnosis not present

## 2023-06-12 MED ORDER — HYDROXYZINE HCL 25 MG PO TABS: 25.0000 mg | ORAL_TABLET | Freq: Two times a day (BID) | ORAL | 0 refills | Status: DC | PRN

## 2023-06-13 ENCOUNTER — Encounter: Payer: Self-pay | Admitting: Family Medicine

## 2023-06-13 ENCOUNTER — Ambulatory Visit: Payer: PPO | Admitting: Family Medicine

## 2023-06-13 VITALS — BP 112/58 | HR 55 | Temp 98.0°F | Ht 63.5 in | Wt 202.0 lb

## 2023-06-13 DIAGNOSIS — R002 Palpitations: Secondary | ICD-10-CM

## 2023-06-13 DIAGNOSIS — N3000 Acute cystitis without hematuria: Secondary | ICD-10-CM | POA: Insufficient documentation

## 2023-06-13 DIAGNOSIS — G8929 Other chronic pain: Secondary | ICD-10-CM

## 2023-06-13 DIAGNOSIS — I728 Aneurysm of other specified arteries: Secondary | ICD-10-CM | POA: Diagnosis not present

## 2023-06-13 DIAGNOSIS — M545 Low back pain, unspecified: Secondary | ICD-10-CM | POA: Diagnosis not present

## 2023-06-13 DIAGNOSIS — Z23 Encounter for immunization: Secondary | ICD-10-CM

## 2023-06-13 DIAGNOSIS — R410 Disorientation, unspecified: Secondary | ICD-10-CM

## 2023-06-13 MED ORDER — DULOXETINE HCL 30 MG PO CPEP: 30.0000 mg | ORAL_CAPSULE | Freq: Every day | ORAL | 3 refills | Status: DC

## 2023-06-13 MED ORDER — ALBUTEROL SULFATE HFA 108 (90 BASE) MCG/ACT IN AERS: 2.0000 | INHALATION_SPRAY | Freq: Four times a day (QID) | RESPIRATORY_TRACT | 2 refills | Status: AC | PRN

## 2023-06-13 MED ORDER — HYDROXYZINE HCL 25 MG PO TABS: 25.0000 mg | ORAL_TABLET | Freq: Two times a day (BID) | ORAL | 0 refills | Status: DC | PRN

## 2023-06-13 MED ORDER — TRAMADOL HCL 50 MG PO TABS: 50.0000 mg | ORAL_TABLET | Freq: Three times a day (TID) | ORAL | 0 refills | Status: DC | PRN

## 2023-06-13 NOTE — Assessment & Plan Note (Signed)
Urine culture returned positive for Citrobacter.  She is adequately treated with Keflex twice daily x 5 days.

## 2023-06-13 NOTE — Assessment & Plan Note (Signed)
  Incidentally seen on lumbar MRI 05/2023 Consider CTA of the abdomen after acute issues resolved

## 2023-06-13 NOTE — Patient Instructions (Addendum)
Compete course of antibiotics.  Decrease atenolol to 1/2 tablet daily.  Start Cymbalta 30 mg daily at bedtime.  Decrease tramadol as able.  Can use 25 mg three times a day  Limit hydroxyzine.. use for agitation.

## 2023-06-13 NOTE — Progress Notes (Signed)
Patient ID: [name] [name], female    DOB: 1946-07-20, 77 y.o.   MRN: [id number]  This visit was conducted in person.  BP (!) 112/58 (BP Location: Left Arm, Patient Position: Sitting, Cuff Size: Normal)   Pulse (!) 55   Temp 98 F (36.7 C) (Temporal)   Ht 5' 3.5" (1.613 m)   Wt 202 lb (91.6 kg)   SpO2 97%   BMI 35.22 kg/m    CC:  Chief Complaint  Patient presents with   Hospitalization Follow-up    Subjective:   HPI: [name] [name] is a 77 y.o. female   history of hypertension, insulin-dependent diabetes, autoimmune hepatitis with cirrhosis, chronic low back pain with lumbar radiculopathy ongoing workup with [name] [name], and CKD 3A presenting on 06/13/2023 for Hospitalization Follow-up  Reviewed recent hospital admission November 15 to November 18 Presented for acute worsening of low back pain, severe. Had no improvement with 2 Lyrica Admitted for MRI lumbar spine pain control physical therapy. Lumbar MRI reveals worsening of foraminal stenosis, prior TRH MD discussed with neurosurgery on 11/15 who reports no acute intervention necessary at this time.   Of note on hospital discharge is that hospitalization was complicated by patient refusal of various aspects of treatment plan. Patient had labile mood during hospitalization and was sometimes aggressive with medical staff. Psych was consulted November 16 due to concerns of mild cognitive impairment, no aggressive behaviors the patient refused to comply with their evaluation. She seems significantly improved after trazodone at night when evaluated on November 17.  Recommended follow-up with neurosurgery, patient had outpatient office visit with Dr. Juanita [name] was increased to 50 mg 3 times daily, tramadol 50 mg every 8 hours.  Acute cystitis: Diagnosed during hospitalization started on Keflex 500 mg twice daily x 5 days Given recurrent UTI history recommended outpatient follow-up with urology Urine culture has returned  showing Citrobacter, pansensitive.  Diabetes stable on home regimen per Endo during hospitalization.  Lung nodules followed by Dr. Leonides [name] oncology  Chronic liver disease and associated thrombocytopenia stable  While in hospital atenolol was held given bradycardia, with plan to resume after discharge if appropriate. Per hospital note patient refused discharge to SNF. Home health services  provided by [facility]   Recommend repeat CBC and c-Met, follow-up urine culture results.   Today she presents with her daughter Today she states pain is moderately controlled with tramadol and hydroxyzine  Lyrica 50 mg one in AM and 2 at night.  USing tramadol only as needed   Seeing pain MD at [facility].  SI joint injections.   She has restarted  but at low dose atenolol 25 mg daily Heart rate remains low at 55 okay BP Readings from Last 3 Encounters:  06/13/23 (!) 112/58  06/09/23 (!) 156/53  04/26/23 (!) 143/54     Relevant past medical, surgical, family and social history reviewed and updated as indicated. Interim medical history since our last visit reviewed. Allergies and medications reviewed and updated. Outpatient Medications Prior to Visit  Medication Sig Dispense Refill   atenolol (TENORMIN) 25 MG tablet Take 25 mg by mouth daily.     cephALEXin (KEFLEX) 500 MG capsule Take 1 capsule (500 mg total) by mouth 2 (two) times daily for 9 doses. 9 capsule 0   cetirizine (ZYRTEC) 10 MG tablet Take 10 mg by mouth daily.     Coenzyme Q10 100 MG TABS Take 100 mg by mouth daily.     CRANBERRY PO Take  1,000 mg by mouth in the morning and at bedtime.     D-MANNOSE PO Take 4,000 mg by mouth in the morning and at bedtime.     furosemide (LASIX) 40 MG tablet Take 40 mg by mouth daily.     insulin lispro (HUMALOG) 100 UNIT/ML injection Inject 50 Units into the skin daily. Per [employer]tr[employer] insulin pump     pantoprazole (PROTONIX) 40 MG tablet TAKE 1 TABLET BY MOUTH EVERY DAY 30 tablet  0   polyethylene glycol (MIRALAX / GLYCOLAX) 17 g packet Take 17 g by mouth daily as needed for mild constipation. 14 each 0   predniSONE (DELTASONE) 10 MG tablet Take 10 mg by mouth daily with breakfast.     pregabalin (LYRICA) 25 MG capsule Take 2 capsules (50 mg total) by mouth 3 (three) times daily. This was recently prescribed by your office MD on 05/29/2023. Please follow up in office for refills.  Dose has been increased to 2 capsules (50 mg total) 3 times daily.     UNABLE TO FIND Take 1 tablet by mouth in the morning and at bedtime. Med Name: Cory Roughen Vitamin     albuterol (PROAIR HFA) 108 (90 Base) MCG/ACT inhaler Inhale 2 puffs into the lungs every 6 (six) hours as needed for wheezing or shortness of breath.     hydrOXYzine (ATARAX) 25 MG tablet Take 1 tablet (25 mg total) by mouth 2 (two) times daily as needed for anxiety (irritability). 10 tablet 0   traMADol (ULTRAM) 50 MG tablet Take 1 tablet (50 mg total) by mouth every 8 (eight) hours as needed for moderate pain (pain score 4-6) or severe pain (pain score 7-10). 20 tablet 0   albuterol (PROAIR HFA) 108 (90 Base) MCG/ACT inhaler Inhale 1-2 puffs into the lungs every 6 (six) hours as needed for wheezing or shortness of breath. (Patient taking differently: Inhale 2 puffs into the lungs every 6 (six) hours as needed for wheezing or shortness of breath.) 1 each 2   dicyclomine (BENTYL) 20 MG tablet Take 1 tablet (20 mg total) by mouth 2 (two) times daily. (Patient taking differently: Take 20 mg by mouth as needed for spasms.) 20 tablet 0   MYRBETRIQ 50 MG TB24 tablet Take 50 mg by mouth daily.     No facility-administered medications prior to visit.     Per HPI unless specifically indicated in ROS section below Review of Systems  Constitutional:  Negative for fatigue and fever.  HENT:  Negative for congestion.   Eyes:  Negative for pain.  Respiratory:  Negative for cough and shortness of breath.   Cardiovascular:  Negative for chest  pain, palpitations and leg swelling.  Gastrointestinal:  Negative for abdominal pain.  Genitourinary:  Negative for dysuria and vaginal bleeding.  Musculoskeletal:  Positive for back pain.  Neurological:  Negative for syncope, light-headedness and headaches.  Psychiatric/Behavioral:  Positive for agitation, confusion and dysphoric mood.    Objective:  BP (!) 112/58 (BP Location: Left Arm, Patient Position: Sitting, Cuff Size: Normal)   Pulse (!) 55   Temp 98 F (36.7 C) (Temporal)   Ht 5' 3.5" (1.613 m)   Wt 202 lb (91.6 kg)   SpO2 97%   BMI 35.22 kg/m   Wt Readings from Last 3 Encounters:  06/13/23 202 lb (91.6 kg)  06/06/23 199 lb 15.3 oz (90.7 kg)  04/26/23 200 lb (90.7 kg)      Physical Exam Constitutional:      General: She  is not in acute distress.    Appearance: Normal appearance. She is well-developed. She is not ill-appearing or toxic-appearing.     Comments: In wheelchair  HENT:     Head: Normocephalic.     Right Ear: Hearing, tympanic membrane, ear canal and external ear normal. Tympanic membrane is not erythematous, retracted or bulging.     Left Ear: Hearing, tympanic membrane, ear canal and external ear normal. Tympanic membrane is not erythematous, retracted or bulging.     Nose: No mucosal edema or rhinorrhea.     Right Sinus: No maxillary sinus tenderness or frontal sinus tenderness.     Left Sinus: No maxillary sinus tenderness or frontal sinus tenderness.     Mouth/Throat:     Pharynx: Uvula midline.  Eyes:     General: Lids are normal. Lids are everted, no foreign bodies appreciated.     Conjunctiva/sclera: Conjunctivae normal.     Pupils: Pupils are equal, round, and reactive to light.  Neck:     Thyroid: No thyroid mass or thyromegaly.     Vascular: No carotid bruit.     Trachea: Trachea normal.  Cardiovascular:     Rate and Rhythm: Normal rate and regular rhythm.     Pulses: Normal pulses.     Heart sounds: Normal heart sounds, S1 normal and S2  normal. No murmur heard.    No friction rub. No gallop.  Pulmonary:     Effort: Pulmonary effort is normal. No tachypnea or respiratory distress.     Breath sounds: Normal breath sounds. No decreased breath sounds, wheezing, rhonchi or rales.  Abdominal:     General: Bowel sounds are normal.     Palpations: Abdomen is soft.     Tenderness: There is no abdominal tenderness.  Musculoskeletal:     Cervical back: Normal range of motion and neck supple.  Skin:    General: Skin is warm and dry.     Findings: No rash.  Neurological:     Mental Status: She is alert and oriented to person, place, and time.     GCS: GCS eye subscore is 4. GCS verbal subscore is 5. GCS motor subscore is 6.     Cranial Nerves: No cranial nerve deficit.     Sensory: No sensory deficit.     Motor: No abnormal muscle tone.     Coordination: Coordination normal.     Gait: Gait abnormal.     Deep Tendon Reflexes: Reflexes are normal and symmetric.  Psychiatric:        Mood and Affect: Mood is not anxious or depressed.        Speech: Speech normal.        Behavior: Behavior normal. Behavior is cooperative.        Thought Content: Thought content normal.        Cognition and Memory: Memory is not impaired. She does not exhibit impaired recent memory or impaired remote memory.        Judgment: Judgment normal.       Results for orders placed or performed during the hospital encounter of 06/06/23  CBC with Differential   Collection Time: 06/06/23  2:46 AM  Result Value Ref Range   WBC 7.8 4.0 - 10.5 K/uL   RBC 4.20 3.87 - 5.11 MIL/uL   Hemoglobin 13.3 12.0 - 15.0 g/dL   HCT 46.9 62.9 - 52.8 %   MCV 94.0 80.0 - 100.0 fL   MCH 31.7 26.0 - 34.0 pg  MCHC 33.7 30.0 - 36.0 g/dL   RDW 16.1 09.6 - 04.5 %   Platelets 96 (L) 150 - 400 K/uL   nRBC 0.0 0.0 - 0.2 %   Neutrophils Relative % 69 %   Neutro Abs 5.5 1.7 - 7.7 K/uL   Lymphocytes Relative 20 %   Lymphs Abs 1.5 0.7 - 4.0 K/uL   Monocytes Relative 9 %    Monocytes Absolute 0.7 0.1 - 1.0 K/uL   Eosinophils Relative 1 %   Eosinophils Absolute 0.1 0.0 - 0.5 K/uL   Basophils Relative 0 %   Basophils Absolute 0.0 0.0 - 0.1 K/uL   Immature Granulocytes 1 %   Abs Immature Granulocytes 0.05 0.00 - 0.07 K/uL  Basic metabolic panel   Collection Time: 06/06/23  2:46 AM  Result Value Ref Range   Sodium 143 135 - 145 mmol/L   Potassium 3.8 3.5 - 5.1 mmol/L   Chloride 111 98 - 111 mmol/L   CO2 24 22 - 32 mmol/L   Glucose, Bld 139 (H) 70 - 99 mg/dL   BUN 33 (H) 8 - 23 mg/dL   Creatinine, Ser 4.09 (H) 0.44 - 1.00 mg/dL   Calcium 9.6 8.9 - 81.1 mg/dL   GFR, Estimated 37 (L) >60 mL/min   Anion gap 8 5 - 15  Basic metabolic panel   Collection Time: 06/06/23  4:42 AM  Result Value Ref Range   Sodium 143 135 - 145 mmol/L   Potassium 3.9 3.5 - 5.1 mmol/L   Chloride 112 (H) 98 - 111 mmol/L   CO2 24 22 - 32 mmol/L   Glucose, Bld 125 (H) 70 - 99 mg/dL   BUN 30 (H) 8 - 23 mg/dL   Creatinine, Ser 9.14 (H) 0.44 - 1.00 mg/dL   Calcium 9.3 8.9 - 78.2 mg/dL   GFR, Estimated 42 (L) >60 mL/min   Anion gap 7 5 - 15  CBC   Collection Time: 06/06/23  4:42 AM  Result Value Ref Range   WBC 6.9 4.0 - 10.5 K/uL   RBC 4.17 3.87 - 5.11 MIL/uL   Hemoglobin 13.1 12.0 - 15.0 g/dL   HCT 95.6 21.3 - 08.6 %   MCV 95.0 80.0 - 100.0 fL   MCH 31.4 26.0 - 34.0 pg   MCHC 33.1 30.0 - 36.0 g/dL   RDW 57.8 46.9 - 62.9 %   Platelets 91 (L) 150 - 400 K/uL   nRBC 0.0 0.0 - 0.2 %  Glucose, capillary   Collection Time: 06/06/23  7:37 AM  Result Value Ref Range   Glucose-Capillary 130 (H) 70 - 99 mg/dL  Glucose, capillary   Collection Time: 06/06/23 11:15 AM  Result Value Ref Range   Glucose-Capillary 200 (H) 70 - 99 mg/dL  Glucose, capillary   Collection Time: 06/06/23  4:39 PM  Result Value Ref Range   Glucose-Capillary 211 (H) 70 - 99 mg/dL  Glucose, capillary   Collection Time: 06/06/23  8:44 PM  Result Value Ref Range   Glucose-Capillary 291 (H) 70 - 99 mg/dL   Glucose, capillary   Collection Time: 06/07/23  3:33 AM  Result Value Ref Range   Glucose-Capillary 252 (H) 70 - 99 mg/dL  Basic metabolic panel   Collection Time: 06/07/23  5:19 AM  Result Value Ref Range   Sodium 139 135 - 145 mmol/L   Potassium 5.4 (H) 3.5 - 5.1 mmol/L   Chloride 107 98 - 111 mmol/L   CO2 26 22 - 32 mmol/L  Glucose, Bld 261 (H) 70 - 99 mg/dL   BUN 29 (H) 8 - 23 mg/dL   Creatinine, Ser 0.45 (H) 0.44 - 1.00 mg/dL   Calcium 9.2 8.9 - 40.9 mg/dL   GFR, Estimated 34 (L) >60 mL/min   Anion gap 6 5 - 15  CBC   Collection Time: 06/07/23  5:19 AM  Result Value Ref Range   WBC 5.3 4.0 - 10.5 K/uL   RBC 3.92 3.87 - 5.11 MIL/uL   Hemoglobin 12.4 12.0 - 15.0 g/dL   HCT 81.1 91.4 - 78.2 %   MCV 96.4 80.0 - 100.0 fL   MCH 31.6 26.0 - 34.0 pg   MCHC 32.8 30.0 - 36.0 g/dL   RDW 95.6 21.3 - 08.6 %   Platelets 83 (L) 150 - 400 K/uL   nRBC 0.0 0.0 - 0.2 %  Glucose, capillary   Collection Time: 06/07/23  8:23 AM  Result Value Ref Range   Glucose-Capillary 221 (H) 70 - 99 mg/dL  Glucose, capillary   Collection Time: 06/07/23 11:47 AM  Result Value Ref Range   Glucose-Capillary 174 (H) 70 - 99 mg/dL  Glucose, capillary   Collection Time: 06/07/23  4:03 PM  Result Value Ref Range   Glucose-Capillary 286 (H) 70 - 99 mg/dL  Glucose, capillary   Collection Time: 06/07/23  8:17 PM  Result Value Ref Range   Glucose-Capillary 264 (H) 70 - 99 mg/dL  Glucose, capillary   Collection Time: 06/08/23  5:36 AM  Result Value Ref Range   Glucose-Capillary 253 (H) 70 - 99 mg/dL  Basic metabolic panel   Collection Time: 06/08/23  5:47 AM  Result Value Ref Range   Sodium 135 135 - 145 mmol/L   Potassium 4.3 3.5 - 5.1 mmol/L   Chloride 105 98 - 111 mmol/L   CO2 23 22 - 32 mmol/L   Glucose, Bld 238 (H) 70 - 99 mg/dL   BUN 24 (H) 8 - 23 mg/dL   Creatinine, Ser 5.78 (H) 0.44 - 1.00 mg/dL   Calcium 9.1 8.9 - 46.9 mg/dL   GFR, Estimated 44 (L) >60 mL/min   Anion gap 7 5 - 15   CBC   Collection Time: 06/08/23  5:47 AM  Result Value Ref Range   WBC 5.5 4.0 - 10.5 K/uL   RBC 3.86 (L) 3.87 - 5.11 MIL/uL   Hemoglobin 12.0 12.0 - 15.0 g/dL   HCT 62.9 52.8 - 41.3 %   MCV 94.8 80.0 - 100.0 fL   MCH 31.1 26.0 - 34.0 pg   MCHC 32.8 30.0 - 36.0 g/dL   RDW 24.4 01.0 - 27.2 %   Platelets 76 (L) 150 - 400 K/uL   nRBC 0.0 0.0 - 0.2 %  Glucose, capillary   Collection Time: 06/08/23  7:27 AM  Result Value Ref Range   Glucose-Capillary 227 (H) 70 - 99 mg/dL  Glucose, capillary   Collection Time: 06/08/23 11:26 AM  Result Value Ref Range   Glucose-Capillary 265 (H) 70 - 99 mg/dL  Glucose, capillary   Collection Time: 06/08/23  4:39 PM  Result Value Ref Range   Glucose-Capillary 324 (H) 70 - 99 mg/dL  Glucose, capillary   Collection Time: 06/08/23  9:14 PM  Result Value Ref Range   Glucose-Capillary 148 (H) 70 - 99 mg/dL  Glucose, capillary   Collection Time: 06/09/23  7:42 AM  Result Value Ref Range   Glucose-Capillary 130 (H) 70 - 99 mg/dL  Urinalysis, Routine w reflex  microscopic -Urine, Clean Catch   Collection Time: 06/09/23 12:31 PM  Result Value Ref Range   Color, Urine YELLOW YELLOW   APPearance CLOUDY (A) CLEAR   Specific Gravity, Urine 1.010 1.005 - 1.030   pH 6.0 5.0 - 8.0   Glucose, UA NEGATIVE NEGATIVE mg/dL   Hgb urine dipstick MODERATE (A) NEGATIVE   Bilirubin Urine NEGATIVE NEGATIVE   Ketones, ur NEGATIVE NEGATIVE mg/dL   Protein, ur NEGATIVE NEGATIVE mg/dL   Nitrite POSITIVE (A) NEGATIVE   Leukocytes,Ua LARGE (A) NEGATIVE   RBC / HPF 0-5 0 - 5 RBC/hpf   WBC, UA >50 0 - 5 WBC/hpf   Bacteria, UA FEW (A) NONE SEEN   Squamous Epithelial / HPF 0-5 0 - 5 /HPF   WBC Clumps PRESENT    Mucus PRESENT   Glucose, capillary   Collection Time: 06/09/23 12:43 PM  Result Value Ref Range   Glucose-Capillary 268 (H) 70 - 99 mg/dL  Glucose, capillary   Collection Time: 06/09/23  4:25 PM  Result Value Ref Range   Glucose-Capillary 243 (H) 70 - 99  mg/dL  Urine Culture   Collection Time: 06/09/23  5:56 PM   Specimen: Urine, Random  Result Value Ref Range   Specimen Description URINE, RANDOM    Special Requests      NONE Performed at [facility], [address], [place], [place] [address]    Culture >=100,000 COLONIES/mL CITROBACTER FREUNDII (A)    Report Status 06/11/2023 FINAL    Organism ID, Bacteria CITROBACTER FREUNDII (A)       Susceptibility   Citrobacter freundii - MIC*    CEFEPIME <=0.12 SENSITIVE Sensitive     CEFTRIAXONE <=0.25 SENSITIVE Sensitive     CIPROFLOXACIN <=0.25 SENSITIVE Sensitive     GENTAMICIN <=1 SENSITIVE Sensitive     IMIPENEM 0.5 SENSITIVE Sensitive     NITROFURANTOIN <=16 SENSITIVE Sensitive     TRIMETH/SULFA <=20 SENSITIVE Sensitive     PIP/TAZO <=4 SENSITIVE Sensitive ug/mL    * >=100,000 COLONIES/mL CITROBACTER FREUNDII    Assessment and Plan  Acute on chronic low back pain Assessment & Plan: Acute, moderate control with tramadol. Using Lyrica 50 mg 1 in the morning and 2 at night. Will start Cymbalta and titrate up as needed for chronic pain.  Decrease tramadol as able.  Can use 25 mg three times a day  Continue to follow up with pain MD at [facility]     Acute cystitis without hematuria Assessment & Plan: Urine culture returned positive for Citrobacter.  She is adequately treated with Keflex twice daily x 5 days.   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   Recommend limiting medication likely affect temporal nervous system. Hold hydralazine unless needed and limit tramadol..   Palpitations Assessment & Plan: Currently on low-dose atenolol 25 mg  daily for palpitations but patient with current bradycardia that is likely side effect of medication.  Given associated fatigue and weakness we will decrease to half a tablet of the 25 mg daily.   Other orders -     Albuterol Sulfate HFA; Inhale 2 puffs into the lungs every 6 (six) hours as needed for wheezing or shortness of  breath.  Dispense: 8 g; Refill: 2 -     hydrOXYzine HCl; Take 1 tablet (25 mg total) by mouth 2 (two) times daily as needed for anxiety (irritability).  Dispense: 20 tablet; Refill: 0 -     traMADol HCl; Take 1 tablet (50 mg total) by mouth every 8 (eight) hours as needed for severe pain (pain score 7-10).  Dispense: 20 tablet; Refill: 0    Return in about 4 weeks (around 07/11/2023) for  follow up mood.   Kerby Nora, MD

## 2023-06-16 ENCOUNTER — Telehealth: Payer: Self-pay | Admitting: Family Medicine

## 2023-06-16 NOTE — Telephone Encounter (Signed)
Left voicemail giving verbal orders for PT 2 x week for 3 weeks, then 1 x week for 3 weeks.

## 2023-06-16 NOTE — Telephone Encounter (Signed)
Home Health verbal orders Caller Name: terry  Agency Name: [facility] number: [phone], secured   Requesting PT  Reason:  Frequency: twice a week for three weeks, once a week for 3 weeks   Please forward to [facility] pool or providers CMA

## 2023-06-23 DIAGNOSIS — R3915 Urgency of urination: Secondary | ICD-10-CM | POA: Diagnosis not present

## 2023-06-23 DIAGNOSIS — N3020 Other chronic cystitis without hematuria: Secondary | ICD-10-CM | POA: Diagnosis not present

## 2023-06-23 DIAGNOSIS — C642 Malignant neoplasm of left kidney, except renal pelvis: Secondary | ICD-10-CM | POA: Diagnosis not present

## 2023-06-24 DIAGNOSIS — R5383 Other fatigue: Secondary | ICD-10-CM | POA: Diagnosis not present

## 2023-06-24 DIAGNOSIS — E1065 Type 1 diabetes mellitus with hyperglycemia: Secondary | ICD-10-CM | POA: Diagnosis not present

## 2023-06-29 DIAGNOSIS — E1165 Type 2 diabetes mellitus with hyperglycemia: Secondary | ICD-10-CM | POA: Diagnosis not present

## 2023-06-30 ENCOUNTER — Other Ambulatory Visit: Payer: Self-pay | Admitting: Hematology and Oncology

## 2023-06-30 ENCOUNTER — Ambulatory Visit (HOSPITAL_COMMUNITY): Payer: PPO

## 2023-06-30 ENCOUNTER — Ambulatory Visit (HOSPITAL_COMMUNITY)
Admission: RE | Admit: 2023-06-30 | Discharge: 2023-06-30 | Disposition: A | Discharge status: Home / Self Care | Payer: PPO | Source: Ambulatory Visit | Attending: Hematology and Oncology | Admitting: Hematology and Oncology

## 2023-06-30 DIAGNOSIS — R918 Other nonspecific abnormal finding of lung field: Secondary | ICD-10-CM | POA: Insufficient documentation

## 2023-06-30 DIAGNOSIS — C642 Malignant neoplasm of left kidney, except renal pelvis: Secondary | ICD-10-CM | POA: Insufficient documentation

## 2023-06-30 DIAGNOSIS — E1065 Type 1 diabetes mellitus with hyperglycemia: Secondary | ICD-10-CM | POA: Diagnosis not present

## 2023-06-30 DIAGNOSIS — K7689 Other specified diseases of liver: Secondary | ICD-10-CM | POA: Diagnosis not present

## 2023-06-30 DIAGNOSIS — K5730 Diverticulosis of large intestine without perforation or abscess without bleeding: Secondary | ICD-10-CM | POA: Diagnosis not present

## 2023-06-30 DIAGNOSIS — N2889 Other specified disorders of kidney and ureter: Secondary | ICD-10-CM

## 2023-06-30 DIAGNOSIS — Z905 Acquired absence of kidney: Secondary | ICD-10-CM | POA: Diagnosis not present

## 2023-07-03 DIAGNOSIS — K754 Autoimmune hepatitis: Secondary | ICD-10-CM | POA: Diagnosis not present

## 2023-07-03 DIAGNOSIS — E1065 Type 1 diabetes mellitus with hyperglycemia: Secondary | ICD-10-CM | POA: Diagnosis not present

## 2023-07-03 DIAGNOSIS — R911 Solitary pulmonary nodule: Secondary | ICD-10-CM | POA: Diagnosis not present

## 2023-07-03 DIAGNOSIS — Z9641 Presence of insulin pump (external) (internal): Secondary | ICD-10-CM | POA: Diagnosis not present

## 2023-07-03 DIAGNOSIS — F419 Anxiety disorder, unspecified: Secondary | ICD-10-CM | POA: Diagnosis not present

## 2023-07-03 DIAGNOSIS — I10 Essential (primary) hypertension: Secondary | ICD-10-CM | POA: Diagnosis not present

## 2023-07-03 DIAGNOSIS — C642 Malignant neoplasm of left kidney, except renal pelvis: Secondary | ICD-10-CM | POA: Diagnosis not present

## 2023-07-03 DIAGNOSIS — E7800 Pure hypercholesterolemia, unspecified: Secondary | ICD-10-CM | POA: Diagnosis not present

## 2023-07-03 DIAGNOSIS — M8580 Other specified disorders of bone density and structure, unspecified site: Secondary | ICD-10-CM | POA: Diagnosis not present

## 2023-07-06 ENCOUNTER — Other Ambulatory Visit: Payer: Self-pay | Admitting: Family Medicine

## 2023-07-07 ENCOUNTER — Inpatient Hospital Stay (HOSPITAL_BASED_OUTPATIENT_CLINIC_OR_DEPARTMENT_OTHER): Payer: PPO | Admitting: Hematology and Oncology

## 2023-07-07 ENCOUNTER — Inpatient Hospital Stay: Payer: PPO

## 2023-07-07 DIAGNOSIS — C642 Malignant neoplasm of left kidney, except renal pelvis: Secondary | ICD-10-CM

## 2023-07-07 NOTE — Progress Notes (Signed)
Rescheduled

## 2023-07-08 DIAGNOSIS — R410 Disorientation, unspecified: Secondary | ICD-10-CM | POA: Insufficient documentation

## 2023-07-08 NOTE — Assessment & Plan Note (Addendum)
Acute, moderate control with tramadol. Using Lyrica 50 mg 1 in the morning and 2 at night. Will start Cymbalta and titrate up as needed for chronic pain.  Decrease tramadol as able.  Can use 25 mg three times a day  Continue to follow up with pain MD at [facility]

## 2023-07-08 NOTE — Assessment & Plan Note (Addendum)
Currently on low-dose atenolol 25 mg daily for palpitations but patient with current bradycardia that is likely side effect of medication.  Given associated fatigue and weakness we will decrease to half a tablet of the 25 mg daily.

## 2023-07-08 NOTE — Assessment & Plan Note (Signed)
Acute confusion and agitation noted during hospitalization: Per daughter confusion continues at home.   Recommend limiting medication likely affect temporal nervous system. Hold hydralazine unless needed and limit tramadol.Marland Kitchen

## 2023-07-09 ENCOUNTER — Ambulatory Visit: Payer: PPO | Admitting: Hematology and Oncology

## 2023-07-09 ENCOUNTER — Other Ambulatory Visit: Payer: PPO

## 2023-07-11 ENCOUNTER — Ambulatory Visit (INDEPENDENT_AMBULATORY_CARE_PROVIDER_SITE_OTHER): Payer: PPO | Admitting: Family Medicine

## 2023-07-11 VITALS — BP 111/51 | HR 80 | Temp 98.2°F | Ht 63.5 in | Wt 197.2 lb

## 2023-07-11 DIAGNOSIS — F331 Major depressive disorder, recurrent, moderate: Secondary | ICD-10-CM | POA: Diagnosis not present

## 2023-07-11 DIAGNOSIS — M5459 Other low back pain: Secondary | ICD-10-CM | POA: Diagnosis not present

## 2023-07-11 DIAGNOSIS — I471 Supraventricular tachycardia, unspecified: Secondary | ICD-10-CM

## 2023-07-11 DIAGNOSIS — R410 Disorientation, unspecified: Secondary | ICD-10-CM

## 2023-07-11 DIAGNOSIS — E1165 Type 2 diabetes mellitus with hyperglycemia: Secondary | ICD-10-CM

## 2023-07-11 MED ORDER — PREGABALIN 50 MG PO CAPS: 100.0000 mg | ORAL_CAPSULE | Freq: Three times a day (TID) | ORAL | 0 refills | Status: DC

## 2023-07-11 MED ORDER — DULOXETINE HCL 60 MG PO CPEP: 60.0000 mg | ORAL_CAPSULE | Freq: Every day | ORAL | 3 refills | Status: DC

## 2023-07-11 NOTE — Assessment & Plan Note (Signed)
Acute on chronic issue along with generalized anxiety. Slight improvement with low-dose Cymbalta 30 mg daily.  No side effects noted.  We will further increase this to 60 mg p.o. daily.  She will call with an update in 1 month.

## 2023-07-11 NOTE — Patient Instructions (Addendum)
Stop hydroxyzine.  Increase cymbalta to 60 mg daily.  Restart atenolol 1/2 tablet of 25 mg daily.. follow BP and stop if lightheadedness or BP < 90/60.

## 2023-07-11 NOTE — Progress Notes (Signed)
Patient ID: Sarine Living, female    DOB: [birth year], 77 y.o.   MRN: [id number]  This visit was conducted in person.  BP (!) 111/51 (BP Location: Left Arm, Patient Position: Sitting, Cuff Size: Normal)   Pulse 80   Temp 98.2 F (36.8 C) (Temporal)   Ht 5' 3.5" (1.613 m)   Wt 197 lb 4 oz (89.5 kg)   SpO2 93%   BMI 34.39 kg/m    CC:  Chief Complaint  Patient presents with   Medical Management of Chronic Issues    Follow up Mood    Subjective:   HPI: Miku Greving is a 77 y.o. female presenting on 07/11/2023 for Medical Management of Chronic Issues (Follow up Mood)   Last office visit on November 22 started on low-dose Celexa 30 mg daily for depression and anxiety.  Also in the setting of chronic pain she was given tramadol for chronic low back pain. On Lyrica 50 mg 1 in the morning 2 at night Given noted confusion per daughter at that office visit recommended limiting sedating medication, holding hydralazine and limiting tramadol. Also at this visit given bradycardia on low-dose atenolol for palpitations, recommended decreased atenolol to half tablet of 25 mg daily in hopes to decrease fatigue, weakness and slow heart rate.  Today she reports no improvement in depression.  She had no side effects to Cymbalta.  She never stopped hydroxyzine.  There appears to be some slight confusion about her medications between her and her daughter.  Heart rate has increased to 80, blood pressure remains well-controlled off atenolol but she feels when she is moving around hear rate jumps up.   Back pain better with lyrica 100 mg TID  Reviewed recent office visit from December 12 Dr. Cleon Gustin endocrinology Recommended continuation of Humalog pump.      07/11/2023    4:30 PM 06/13/2023    3:35 PM 01/14/2022    9:11 AM  Depression screen PHQ 2/9  Decreased Interest 3 3 0  Down, Depressed, Hopeless 3 3 0  PHQ - 2 Score 6 6 0  Altered sleeping 3 3   Tired, decreased energy 3 3   Change in  appetite 3 2   Feeling bad or failure about yourself  1    Trouble concentrating 3 3   Moving slowly or fidgety/restless 3 3   Suicidal thoughts 0    PHQ-9 Score 22 20   Difficult doing work/chores Somewhat difficult Somewhat difficult        07/11/2023    4:31 PM  GAD 7 : Generalized Anxiety Score  Nervous, Anxious, on Edge 2  Control/stop worrying 3  Worry too much - different things 3  Trouble relaxing 0  Restless 0  Easily annoyed or irritable 1  Afraid - awful might happen 0  Total GAD 7 Score 9         Relevant past medical, surgical, family and social history reviewed and updated as indicated. Interim medical history since our last visit reviewed. Allergies and medications reviewed and updated. Outpatient Medications Prior to Visit  Medication Sig Dispense Refill   albuterol (PROAIR HFA) 108 (90 Base) MCG/ACT inhaler Inhale 2 puffs into the lungs every 6 (six) hours as needed for wheezing or shortness of breath. 8 g 2   cetirizine (ZYRTEC) 10 MG tablet Take 10 mg by mouth daily.     Coenzyme Q10 100 MG TABS Take 100 mg by mouth daily.     CRANBERRY PO Take  1,000 mg by mouth in the morning and at bedtime.     D-MANNOSE PO Take 4,000 mg by mouth in the morning and at bedtime.     furosemide (LASIX) 40 MG tablet Take 40 mg by mouth daily.     hydrOXYzine (ATARAX) 25 MG tablet Take 1 tablet (25 mg total) by mouth 2 (two) times daily as needed for anxiety (irritability). 20 tablet 0   insulin lispro (HUMALOG) 100 UNIT/ML injection Inject 50 Units into the skin daily. Per Medtronic insulin pump     pantoprazole (PROTONIX) 40 MG tablet TAKE 1 TABLET BY MOUTH EVERY DAY 30 tablet 0   polyethylene glycol (MIRALAX / GLYCOLAX) 17 g packet Take 17 g by mouth daily as needed for mild constipation. 14 each 0   predniSONE (DELTASONE) 10 MG tablet Take 10 mg by mouth daily with breakfast.     traMADol (ULTRAM) 50 MG tablet Take 1 tablet (50 mg total) by mouth every 8 (eight) hours as  needed for severe pain (pain score 7-10). 20 tablet 0   UNABLE TO FIND Take 1 tablet by mouth in the morning and at bedtime. Med Name: Cory Roughen Vitamin     DULoxetine (CYMBALTA) 30 MG capsule TAKE 1 CAPSULE BY MOUTH EVERY DAY 90 capsule 0   pregabalin (LYRICA) 25 MG capsule Take 2 capsules (50 mg total) by mouth 3 (three) times daily. This was recently prescribed by your office MD on 05/29/2023. Please follow up in office for refills.  Dose has been increased to 2 capsules (50 mg total) 3 times daily.     pregabalin (LYRICA) 50 MG capsule Take 100 mg by mouth 3 (three) times daily.     atenolol (TENORMIN) 25 MG tablet Take 25 mg by mouth daily. (Patient not taking: Reported on 07/11/2023)     No facility-administered medications prior to visit.     Per HPI unless specifically indicated in ROS section below Review of Systems  Constitutional:  Positive for fatigue. Negative for fever.  HENT:  Negative for congestion.   Eyes:  Negative for pain.  Respiratory:  Negative for cough and shortness of breath.   Cardiovascular:  Negative for chest pain, palpitations and leg swelling.  Gastrointestinal:  Negative for abdominal pain.  Genitourinary:  Negative for dysuria and vaginal bleeding.  Musculoskeletal:  Positive for back pain.  Neurological:  Negative for syncope, light-headedness and headaches.  Psychiatric/Behavioral:  Positive for dysphoric mood. The patient is nervous/anxious.    Objective:  BP (!) 111/51 (BP Location: Left Arm, Patient Position: Sitting, Cuff Size: Normal)   Pulse 80   Temp 98.2 F (36.8 C) (Temporal)   Ht 5' 3.5" (1.613 m)   Wt 197 lb 4 oz (89.5 kg)   SpO2 93%   BMI 34.39 kg/m   Wt Readings from Last 3 Encounters:  07/11/23 197 lb 4 oz (89.5 kg)  06/13/23 202 lb (91.6 kg)  06/06/23 199 lb 15.3 oz (90.7 kg)      Physical Exam Constitutional:      General: She is not in acute distress.    Appearance: Normal appearance. She is well-developed. She is not  ill-appearing or toxic-appearing.  HENT:     Head: Normocephalic.     Right Ear: Hearing, tympanic membrane, ear canal and external ear normal. Tympanic membrane is not erythematous, retracted or bulging.     Left Ear: Hearing, tympanic membrane, ear canal and external ear normal. Tympanic membrane is not erythematous, retracted or bulging.  Nose: No mucosal edema or rhinorrhea.     Right Sinus: No maxillary sinus tenderness or frontal sinus tenderness.     Left Sinus: No maxillary sinus tenderness or frontal sinus tenderness.     Mouth/Throat:     Pharynx: Uvula midline.  Eyes:     General: Lids are normal. Lids are everted, no foreign bodies appreciated.     Conjunctiva/sclera: Conjunctivae normal.     Pupils: Pupils are equal, round, and reactive to light.  Neck:     Thyroid: No thyroid mass or thyromegaly.     Vascular: No carotid bruit.     Trachea: Trachea normal.  Cardiovascular:     Rate and Rhythm: Normal rate and regular rhythm.     Pulses: Normal pulses.     Heart sounds: Normal heart sounds, S1 normal and S2 normal. No murmur heard.    No friction rub. No gallop.  Pulmonary:     Effort: Pulmonary effort is normal. No tachypnea or respiratory distress.     Breath sounds: Normal breath sounds. No decreased breath sounds, wheezing, rhonchi or rales.  Abdominal:     General: Bowel sounds are normal.     Palpations: Abdomen is soft.     Tenderness: There is no abdominal tenderness.  Musculoskeletal:     Cervical back: Normal range of motion and neck supple.  Skin:    General: Skin is warm and dry.     Findings: No rash.  Neurological:     Mental Status: She is alert.  Psychiatric:        Mood and Affect: Mood is not anxious or depressed.        Speech: Speech normal.        Behavior: Behavior normal. Behavior is cooperative.        Thought Content: Thought content normal.        Judgment: Judgment normal.       Results for orders placed or performed during the  hospital encounter of 06/06/23  CBC with Differential   Collection Time: 06/06/23  2:46 AM  Result Value Ref Range   WBC 7.8 4.0 - 10.5 K/uL   RBC 4.20 3.87 - 5.11 MIL/uL   Hemoglobin 13.3 12.0 - 15.0 g/dL   HCT 16.1 09.6 - 04.5 %   MCV 94.0 80.0 - 100.0 fL   MCH 31.7 26.0 - 34.0 pg   MCHC 33.7 30.0 - 36.0 g/dL   RDW 40.9 81.1 - 91.4 %   Platelets 96 (L) 150 - 400 K/uL   nRBC 0.0 0.0 - 0.2 %   Neutrophils Relative % 69 %   Neutro Abs 5.5 1.7 - 7.7 K/uL   Lymphocytes Relative 20 %   Lymphs Abs 1.5 0.7 - 4.0 K/uL   Monocytes Relative 9 %   Monocytes Absolute 0.7 0.1 - 1.0 K/uL   Eosinophils Relative 1 %   Eosinophils Absolute 0.1 0.0 - 0.5 K/uL   Basophils Relative 0 %   Basophils Absolute 0.0 0.0 - 0.1 K/uL   Immature Granulocytes 1 %   Abs Immature Granulocytes 0.05 0.00 - 0.07 K/uL  Basic metabolic panel   Collection Time: 06/06/23  2:46 AM  Result Value Ref Range   Sodium 143 135 - 145 mmol/L   Potassium 3.8 3.5 - 5.1 mmol/L   Chloride 111 98 - 111 mmol/L   CO2 24 22 - 32 mmol/L   Glucose, Bld 139 (H) 70 - 99 mg/dL   BUN 33 (H) 8 -  23 mg/dL   Creatinine, Ser 8.65 (H) 0.44 - 1.00 mg/dL   Calcium 9.6 8.9 - 78.4 mg/dL   GFR, Estimated 37 (L) >60 mL/min   Anion gap 8 5 - 15  Basic metabolic panel   Collection Time: 06/06/23  4:42 AM  Result Value Ref Range   Sodium 143 135 - 145 mmol/L   Potassium 3.9 3.5 - 5.1 mmol/L   Chloride 112 (H) 98 - 111 mmol/L   CO2 24 22 - 32 mmol/L   Glucose, Bld 125 (H) 70 - 99 mg/dL   BUN 30 (H) 8 - 23 mg/dL   Creatinine, Ser 6.96 (H) 0.44 - 1.00 mg/dL   Calcium 9.3 8.9 - 29.5 mg/dL   GFR, Estimated 42 (L) >60 mL/min   Anion gap 7 5 - 15  CBC   Collection Time: 06/06/23  4:42 AM  Result Value Ref Range   WBC 6.9 4.0 - 10.5 K/uL   RBC 4.17 3.87 - 5.11 MIL/uL   Hemoglobin 13.1 12.0 - 15.0 g/dL   HCT 28.4 13.2 - 44.0 %   MCV 95.0 80.0 - 100.0 fL   MCH 31.4 26.0 - 34.0 pg   MCHC 33.1 30.0 - 36.0 g/dL   RDW 10.2 72.5 - 36.6 %    Platelets 91 (L) 150 - 400 K/uL   nRBC 0.0 0.0 - 0.2 %  Glucose, capillary   Collection Time: 06/06/23  7:37 AM  Result Value Ref Range   Glucose-Capillary 130 (H) 70 - 99 mg/dL  Glucose, capillary   Collection Time: 06/06/23 11:15 AM  Result Value Ref Range   Glucose-Capillary 200 (H) 70 - 99 mg/dL  Glucose, capillary   Collection Time: 06/06/23  4:39 PM  Result Value Ref Range   Glucose-Capillary 211 (H) 70 - 99 mg/dL  Glucose, capillary   Collection Time: 06/06/23  8:44 PM  Result Value Ref Range   Glucose-Capillary 291 (H) 70 - 99 mg/dL  Glucose, capillary   Collection Time: 06/07/23  3:33 AM  Result Value Ref Range   Glucose-Capillary 252 (H) 70 - 99 mg/dL  Basic metabolic panel   Collection Time: 06/07/23  5:19 AM  Result Value Ref Range   Sodium 139 135 - 145 mmol/L   Potassium 5.4 (H) 3.5 - 5.1 mmol/L   Chloride 107 98 - 111 mmol/L   CO2 26 22 - 32 mmol/L   Glucose, Bld 261 (H) 70 - 99 mg/dL   BUN 29 (H) 8 - 23 mg/dL   Creatinine, Ser 4.40 (H) 0.44 - 1.00 mg/dL   Calcium 9.2 8.9 - 34.7 mg/dL   GFR, Estimated 34 (L) >60 mL/min   Anion gap 6 5 - 15  CBC   Collection Time: 06/07/23  5:19 AM  Result Value Ref Range   WBC 5.3 4.0 - 10.5 K/uL   RBC 3.92 3.87 - 5.11 MIL/uL   Hemoglobin 12.4 12.0 - 15.0 g/dL   HCT 42.5 95.6 - 38.7 %   MCV 96.4 80.0 - 100.0 fL   MCH 31.6 26.0 - 34.0 pg   MCHC 32.8 30.0 - 36.0 g/dL   RDW 56.4 33.2 - 95.1 %   Platelets 83 (L) 150 - 400 K/uL   nRBC 0.0 0.0 - 0.2 %  Glucose, capillary   Collection Time: 06/07/23  8:23 AM  Result Value Ref Range   Glucose-Capillary 221 (H) 70 - 99 mg/dL  Glucose, capillary   Collection Time: 06/07/23 11:47 AM  Result Value Ref Range  Glucose-Capillary 174 (H) 70 - 99 mg/dL  Glucose, capillary   Collection Time: 06/07/23  4:03 PM  Result Value Ref Range   Glucose-Capillary 286 (H) 70 - 99 mg/dL  Glucose, capillary   Collection Time: 06/07/23  8:17 PM  Result Value Ref Range   Glucose-Capillary  264 (H) 70 - 99 mg/dL  Glucose, capillary   Collection Time: 06/08/23  5:36 AM  Result Value Ref Range   Glucose-Capillary 253 (H) 70 - 99 mg/dL  Basic metabolic panel   Collection Time: 06/08/23  5:47 AM  Result Value Ref Range   Sodium 135 135 - 145 mmol/L   Potassium 4.3 3.5 - 5.1 mmol/L   Chloride 105 98 - 111 mmol/L   CO2 23 22 - 32 mmol/L   Glucose, Bld 238 (H) 70 - 99 mg/dL   BUN 24 (H) 8 - 23 mg/dL   Creatinine, Ser 6.96 (H) 0.44 - 1.00 mg/dL   Calcium 9.1 8.9 - 29.5 mg/dL   GFR, Estimated 44 (L) >60 mL/min   Anion gap 7 5 - 15  CBC   Collection Time: 06/08/23  5:47 AM  Result Value Ref Range   WBC 5.5 4.0 - 10.5 K/uL   RBC 3.86 (L) 3.87 - 5.11 MIL/uL   Hemoglobin 12.0 12.0 - 15.0 g/dL   HCT 28.4 13.2 - 44.0 %   MCV 94.8 80.0 - 100.0 fL   MCH 31.1 26.0 - 34.0 pg   MCHC 32.8 30.0 - 36.0 g/dL   RDW 10.2 72.5 - 36.6 %   Platelets 76 (L) 150 - 400 K/uL   nRBC 0.0 0.0 - 0.2 %  Glucose, capillary   Collection Time: 06/08/23  7:27 AM  Result Value Ref Range   Glucose-Capillary 227 (H) 70 - 99 mg/dL  Glucose, capillary   Collection Time: 06/08/23 11:26 AM  Result Value Ref Range   Glucose-Capillary 265 (H) 70 - 99 mg/dL  Glucose, capillary   Collection Time: 06/08/23  4:39 PM  Result Value Ref Range   Glucose-Capillary 324 (H) 70 - 99 mg/dL  Glucose, capillary   Collection Time: 06/08/23  9:14 PM  Result Value Ref Range   Glucose-Capillary 148 (H) 70 - 99 mg/dL  Glucose, capillary   Collection Time: 06/09/23  7:42 AM  Result Value Ref Range   Glucose-Capillary 130 (H) 70 - 99 mg/dL  Urinalysis, Routine w reflex microscopic -Urine, Clean Catch   Collection Time: 06/09/23 12:31 PM  Result Value Ref Range   Color, Urine YELLOW YELLOW   APPearance CLOUDY (A) CLEAR   Specific Gravity, Urine 1.010 1.005 - 1.030   pH 6.0 5.0 - 8.0   Glucose, UA NEGATIVE NEGATIVE mg/dL   Hgb urine dipstick MODERATE (A) NEGATIVE   Bilirubin Urine NEGATIVE NEGATIVE   Ketones, ur  NEGATIVE NEGATIVE mg/dL   Protein, ur NEGATIVE NEGATIVE mg/dL   Nitrite POSITIVE (A) NEGATIVE   Leukocytes,Ua LARGE (A) NEGATIVE   RBC / HPF 0-5 0 - 5 RBC/hpf   WBC, UA >50 0 - 5 WBC/hpf   Bacteria, UA FEW (A) NONE SEEN   Squamous Epithelial / HPF 0-5 0 - 5 /HPF   WBC Clumps PRESENT    Mucus PRESENT   Glucose, capillary   Collection Time: 06/09/23 12:43 PM  Result Value Ref Range   Glucose-Capillary 268 (H) 70 - 99 mg/dL  Glucose, capillary   Collection Time: 06/09/23  4:25 PM  Result Value Ref Range   Glucose-Capillary 243 (H) 70 - 99  mg/dL  Urine Culture   Collection Time: 06/09/23  5:56 PM   Specimen: Urine, Random  Result Value Ref Range   Specimen Description URINE, RANDOM    Special Requests      NONE Performed at [facility], [address], [place], [place] [address]    Culture >=100,000 COLONIES/mL CITROBACTER FREUNDII (A)    Report Status 06/11/2023 FINAL    Organism ID, Bacteria CITROBACTER FREUNDII (A)       Susceptibility   Citrobacter freundii - MIC*    CEFEPIME <=0.12 SENSITIVE Sensitive     CEFTRIAXONE <=0.25 SENSITIVE Sensitive     CIPROFLOXACIN <=0.25 SENSITIVE Sensitive     GENTAMICIN <=1 SENSITIVE Sensitive     IMIPENEM 0.5 SENSITIVE Sensitive     NITROFURANTOIN <=16 SENSITIVE Sensitive     TRIMETH/SULFA <=20 SENSITIVE Sensitive     PIP/TAZO <=4 SENSITIVE Sensitive ug/mL    * >=100,000 COLONIES/mL CITROBACTER FREUNDII    Assessment and Plan  Poorly controlled type 2 diabetes mellitus (HCC) Assessment & Plan: Chronic, followed by endocrinology   SVT (supraventricular tachycardia) (HCC) Assessment & Plan: Chronic, history of palpitations treated in the past with atenolol.  This medication was held while blood pressure was low in hospital.  She states she is having episodes several times a week.  We will restart the atenolol at half a tablet 25 mg daily to better control palpitations but avoid hypotension.  We also did briefly discuss  metoprolol short acting as needed palpitations but she would like to restart the medication she is familiar with.   Intractable low back pain Assessment & Plan: Chronic, some significant improvement with Lyrica at higher dose 100 mg p.o. 3 times daily.  She is trying to limit tramadol use to avoid oversedation.    MDD (major depressive disorder), recurrent episode, moderate (HCC) Assessment & Plan: Acute on chronic issue along with generalized anxiety. Slight improvement with low-dose Cymbalta 30 mg daily.  No side effects noted.  We will further increase this to 60 mg p.o. daily.  She will call with an update in 1 month.     Confusion Assessment & Plan: Acute, it appears that there may still be some confusion especially given medications, but this does not appear to be worsening.  Will continue to try to limit sedating medications and I have again encouraged her to stop hydroxyzine.   Other orders -     DULoxetine HCl; Take 1 capsule (60 mg total) by mouth daily.  Dispense: 90 capsule; Refill: 3 -     Pregabalin; Take 2 capsules (100 mg total) by mouth 3 (three) times daily.  Dispense: 180 capsule; Refill: 0    Return in about 6 months (around 01/09/2024) for phone AMW,  fasting labs then CPE with me.   Kerby Nora, MD

## 2023-07-11 NOTE — Assessment & Plan Note (Signed)
Acute, it appears that there may still be some confusion especially given medications, but this does not appear to be worsening.  Will continue to try to limit sedating medications and I have again encouraged her to stop hydroxyzine.

## 2023-07-11 NOTE — Assessment & Plan Note (Signed)
Chronic, some significant improvement with Lyrica at higher dose 100 mg p.o. 3 times daily.  She is trying to limit tramadol use to avoid oversedation.

## 2023-07-11 NOTE — Assessment & Plan Note (Signed)
Chronic, followed by endocrinology 

## 2023-07-11 NOTE — Assessment & Plan Note (Signed)
Chronic, history of palpitations treated in the past with atenolol.  This medication was held while blood pressure was low in hospital.  She states she is having episodes several times a week.  We will restart the atenolol at half a tablet 25 mg daily to better control palpitations but avoid hypotension.  We also did briefly discuss metoprolol short acting as needed palpitations but she would like to restart the medication she is familiar with.

## 2023-07-22 ENCOUNTER — Telehealth: Payer: Self-pay | Admitting: Physician Assistant

## 2023-07-24 ENCOUNTER — Telehealth: Payer: Self-pay | Admitting: Physician Assistant

## 2023-07-24 NOTE — Telephone Encounter (Signed)
 [name] [name]

## 2023-07-28 DIAGNOSIS — K7460 Unspecified cirrhosis of liver: Secondary | ICD-10-CM | POA: Diagnosis not present

## 2023-07-28 DIAGNOSIS — E1142 Type 2 diabetes mellitus with diabetic polyneuropathy: Secondary | ICD-10-CM | POA: Diagnosis not present

## 2023-07-28 DIAGNOSIS — M5416 Radiculopathy, lumbar region: Secondary | ICD-10-CM | POA: Diagnosis not present

## 2023-07-28 DIAGNOSIS — N1831 Chronic kidney disease, stage 3a: Secondary | ICD-10-CM | POA: Diagnosis not present

## 2023-07-28 DIAGNOSIS — D696 Thrombocytopenia, unspecified: Secondary | ICD-10-CM | POA: Diagnosis not present

## 2023-07-28 DIAGNOSIS — Z794 Long term (current) use of insulin: Secondary | ICD-10-CM | POA: Diagnosis not present

## 2023-07-28 DIAGNOSIS — F4325 Adjustment disorder with mixed disturbance of emotions and conduct: Secondary | ICD-10-CM | POA: Diagnosis not present

## 2023-07-28 DIAGNOSIS — E1122 Type 2 diabetes mellitus with diabetic chronic kidney disease: Secondary | ICD-10-CM | POA: Diagnosis not present

## 2023-07-28 DIAGNOSIS — I130 Hypertensive heart and chronic kidney disease with heart failure and stage 1 through stage 4 chronic kidney disease, or unspecified chronic kidney disease: Secondary | ICD-10-CM | POA: Diagnosis not present

## 2023-07-28 DIAGNOSIS — I509 Heart failure, unspecified: Secondary | ICD-10-CM | POA: Diagnosis not present

## 2023-07-28 DIAGNOSIS — K754 Autoimmune hepatitis: Secondary | ICD-10-CM | POA: Diagnosis not present

## 2023-07-28 DIAGNOSIS — I728 Aneurysm of other specified arteries: Secondary | ICD-10-CM | POA: Diagnosis not present

## 2023-07-30 DIAGNOSIS — K754 Autoimmune hepatitis: Secondary | ICD-10-CM | POA: Diagnosis not present

## 2023-07-30 DIAGNOSIS — K7460 Unspecified cirrhosis of liver: Secondary | ICD-10-CM | POA: Diagnosis not present

## 2023-07-30 DIAGNOSIS — E1165 Type 2 diabetes mellitus with hyperglycemia: Secondary | ICD-10-CM | POA: Diagnosis not present

## 2023-07-31 DIAGNOSIS — K7460 Unspecified cirrhosis of liver: Secondary | ICD-10-CM | POA: Diagnosis not present

## 2023-07-31 DIAGNOSIS — K754 Autoimmune hepatitis: Secondary | ICD-10-CM | POA: Diagnosis not present

## 2023-08-07 DIAGNOSIS — M5416 Radiculopathy, lumbar region: Secondary | ICD-10-CM | POA: Diagnosis not present

## 2023-08-10 ENCOUNTER — Other Ambulatory Visit: Payer: Self-pay | Admitting: Family Medicine

## 2023-08-10 ENCOUNTER — Other Ambulatory Visit: Payer: Self-pay | Admitting: Cardiovascular Disease

## 2023-08-11 NOTE — Telephone Encounter (Signed)
Last office visit 07/11/2023 for Medical Management of Chronic Issues.  Last refilled 07/11/2023 for #180 with no refills.  Next Appt: No future appointments with PCP.

## 2023-08-12 ENCOUNTER — Encounter: Payer: Self-pay | Admitting: Family Medicine

## 2023-08-12 NOTE — Progress Notes (Signed)
Call patient.  We received a message from home health with the urine culture showing Klebsiella bacteria.  Is she currently being treated for this?

## 2023-08-13 MED ORDER — CEPHALEXIN 500 MG PO CAPS: 500.0000 mg | ORAL_CAPSULE | Freq: Three times a day (TID) | ORAL | 0 refills | Status: DC

## 2023-08-13 NOTE — Addendum Note (Signed)
Addended by: Kerby Nora E on: 08/13/2023 10:35 AM   Modules accepted: Orders

## 2023-08-28 ENCOUNTER — Other Ambulatory Visit: Payer: Self-pay | Admitting: Physician Assistant

## 2023-08-28 DIAGNOSIS — C642 Malignant neoplasm of left kidney, except renal pelvis: Secondary | ICD-10-CM

## 2023-08-29 ENCOUNTER — Inpatient Hospital Stay: Payer: PPO

## 2023-08-29 ENCOUNTER — Inpatient Hospital Stay: Payer: PPO | Attending: Physician Assistant | Admitting: Physician Assistant

## 2023-08-29 VITALS — BP 139/21 | HR 56 | Temp 97.7°F | Resp 16 | Wt 201.9 lb

## 2023-08-29 DIAGNOSIS — E1165 Type 2 diabetes mellitus with hyperglycemia: Secondary | ICD-10-CM | POA: Diagnosis not present

## 2023-08-29 DIAGNOSIS — Z9049 Acquired absence of other specified parts of digestive tract: Secondary | ICD-10-CM | POA: Insufficient documentation

## 2023-08-29 DIAGNOSIS — C642 Malignant neoplasm of left kidney, except renal pelvis: Secondary | ICD-10-CM | POA: Insufficient documentation

## 2023-08-29 DIAGNOSIS — Z905 Acquired absence of kidney: Secondary | ICD-10-CM | POA: Insufficient documentation

## 2023-08-29 DIAGNOSIS — R918 Other nonspecific abnormal finding of lung field: Secondary | ICD-10-CM | POA: Insufficient documentation

## 2023-08-29 DIAGNOSIS — K862 Cyst of pancreas: Secondary | ICD-10-CM | POA: Diagnosis not present

## 2023-08-29 LAB — CBC WITH DIFFERENTIAL (CANCER CENTER ONLY)
Abs Immature Granulocytes: 0.06 10*3/uL (ref 0.00–0.07)
Basophils Absolute: 0 10*3/uL (ref 0.0–0.1)
Basophils Relative: 0 %
Eosinophils Absolute: 0.2 10*3/uL (ref 0.0–0.5)
Eosinophils Relative: 2 %
HCT: 43.7 % (ref 36.0–46.0)
Hemoglobin: 13.8 g/dL (ref 12.0–15.0)
Immature Granulocytes: 1 %
Lymphocytes Relative: 28 %
Lymphs Abs: 2.5 10*3/uL (ref 0.7–4.0)
MCH: 30 pg (ref 26.0–34.0)
MCHC: 31.6 g/dL (ref 30.0–36.0)
MCV: 95 fL (ref 80.0–100.0)
Monocytes Absolute: 0.7 10*3/uL (ref 0.1–1.0)
Monocytes Relative: 8 %
Neutro Abs: 5.4 10*3/uL (ref 1.7–7.7)
Neutrophils Relative %: 61 %
Platelet Count: 159 10*3/uL (ref 150–400)
RBC: 4.6 MIL/uL (ref 3.87–5.11)
RDW: 13.2 % (ref 11.5–15.5)
WBC Count: 8.8 10*3/uL (ref 4.0–10.5)
nRBC: 0 % (ref 0.0–0.2)

## 2023-08-29 LAB — CMP (CANCER CENTER ONLY)
ALT: 37 U/L (ref 0–44)
AST: 38 U/L (ref 15–41)
Albumin: 3.5 g/dL (ref 3.5–5.0)
Alkaline Phosphatase: 166 U/L — ABNORMAL HIGH (ref 38–126)
Anion gap: 4 — ABNORMAL LOW (ref 5–15)
BUN: 27 mg/dL — ABNORMAL HIGH (ref 8–23)
CO2: 31 mmol/L (ref 22–32)
Calcium: 9.5 mg/dL (ref 8.9–10.3)
Chloride: 107 mmol/L (ref 98–111)
Creatinine: 1.27 mg/dL — ABNORMAL HIGH (ref 0.44–1.00)
GFR, Estimated: 44 mL/min — ABNORMAL LOW (ref >60)
Glucose, Bld: 119 mg/dL — ABNORMAL HIGH (ref 70–99)
Potassium: 4.8 mmol/L (ref 3.5–5.1)
Sodium: 142 mmol/L (ref 135–145)
Total Bilirubin: 0.9 mg/dL (ref 0.0–1.2)
Total Protein: 6.1 g/dL — ABNORMAL LOW (ref 6.5–8.1)

## 2023-08-29 NOTE — Progress Notes (Signed)
 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 Federico  08/05/2022: CT Chest WO showed numerous bilateral pulmonary nodules concerning for metastatic disease without interval change (from 06/17/2022 CT scan) 08/14/2022: left kidney resetion and appendectomy. Pathology confirms Clear Cell RCC  Interval History:  [name] [name] 78 y.o. female with medical history significant clear-cell carcinoma of the kidney status post resection who presents for a follow up visit.  In the interim since the last visit she had a repeat CT scan which showed a decrease in the size of her pulmonary nodules.   On exam today [name] reports that she has been well in the interim since her last visit.  She reports she has been feeling well overall. Her energy level is fairly stable. She denies nausea, vomiting or abdominal pain. She has occasional episodes of diarrhea versus constipation. She denies easy bruising or signs of bleeding. She is otherwise at her baseline level of health. She denies fevers, chills, sweats, shortness of breath, chest pain or cough. She has no other complaints. Rest of the 10 point ROS is below.   MEDICAL HISTORY:  Past Medical History:  Diagnosis Date   Allergic rhinitis, cause unspecified    Anxiety    Arthritis    hands, feet and back   Asthma    Benign neoplasm of colon    Bursitis    hips   CHF (congestive heart failure) (HCC)    Cirrhosis (HCC)    auto immune   Complication of anesthesia     i WAKE UP WITH TREMORS  after shoulder surgery tremors for 10 months after surgery surgery- 2009- surgical center in [place]   Cyst and pseudocyst of  pancreas    Depressive disorder, not elsewhere classified    Diabetes mellitus without complication (HCC)    Diverticulitis    Diverticulosis of colon (without mention of hemorrhage)    Dyspnea    Dysrhythmia    arrhythmia   Esophageal reflux    Essential and other specified forms of tremor    History of GI bleed    History of kidney stones    Irritable bowel syndrome    Obesity, unspecified    Other specified cardiac dysrhythmias(427.89)    Periapical abscess without sinus    Polyneuropathy in diabetes(357.2)    PONV (postoperative nausea and vomiting)    Pure hypercholesterolemia    Renal cell carcinoma (HCC)    Sleep apnea    CPAP- non compliant   Type II or unspecified type diabetes mellitus with neurological manifestations, not stated as uncontrolled(250.60)     SURGICAL HISTORY: Past Surgical History:  Procedure Laterality Date   ABDOMINAL HYSTERECTOMY     CALCANEAL OSTEOTOMY Right 07/27/2015   Procedure: RIGHT CALCANEAL OSTEOTOMY ;  Surgeon: Norleen Armor, MD;  Location: [facility];  Service: Orthopedics;  Laterality: Right;   CHOLECYSTECTOMY     15-20 years ago   COLONOSCOPY WITH PROPOFOL  N/A 08/17/2019   Procedure: COLONOSCOPY WITH PROPOFOL ;  Surgeon: Rollin Dover, MD;  Location: [facility];  Service: Endoscopy;  Laterality: N/A;   COLONOSCOPY WITH PROPOFOL  N/A 06/21/2022   Procedure: COLONOSCOPY WITH PROPOFOL ;  Surgeon: Rollin Dover, MD;  Location: [facility];  Service: Gastroenterology;  Laterality: N/A;   ESOPHAGOGASTRODUODENOSCOPY (EGD) WITH PROPOFOL  N/A 08/17/2019   Procedure: ESOPHAGOGASTRODUODENOSCOPY (EGD) WITH PROPOFOL ;  Surgeon: Rollin Dover, MD;  Location: [facility] ENDOSCOPY;  Service: Endoscopy;  Laterality: N/A;   GASTROCNEMIUS RECESSION Right 07/27/2015   Procedure: RIGHT GASTROC RECESSION;  Surgeon: Norleen Armor, MD;  Location: [facility];  Service: Orthopedics;  Laterality: Right;   KIDNEY STONE SURGERY  1980's   removal    LITHOTRIPSY     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   PARTIAL HYSTERECTOMY  1990's   POLYPECTOMY  08/17/2019   Procedure: POLYPECTOMY;  Surgeon: Rollin Dover, MD;  Location: [facility] ENDOSCOPY;  Service: Endoscopy;;   POLYPECTOMY  06/21/2022   Procedure: POLYPECTOMY;  Surgeon: Rollin Dover, MD;  Location: [facility];  Service: Gastroenterology;;   ROBOT ASSISTED LAPAROSCOPIC NEPHRECTOMY Left 08/14/2022   Procedure: XI ROBOTIC ASSISTED LAPAROSCOPIC NEPHRECTOMY;  Surgeon: Devere Lonni Righter, MD;  Location: [facility];  Service: Urology;  Laterality: Left;   ROTATOR CUFF REPAIR  2005   XI ROBOTIC LAPAROSCOPIC ASSISTED APPENDECTOMY N/A 08/14/2022   Procedure: XI ROBOTIC LAPAROSCOPIC ASSISTED APPENDECTOMY;  Surgeon: Debby Hila, MD;  Location: [facility];  Service: General;  Laterality: N/A;    SOCIAL HISTORY: Social History   Socioeconomic History   Marital status: Widowed    Spouse name: Not on file   Number of children: 5   Years of education: Not on file   Highest education level: Not on file  Occupational History    Employer: RETIRED  Tobacco Use   Smoking status: Never   Smokeless tobacco: Never  Vaping Use   Vaping status: Never Used  Substance and Sexual Activity   Alcohol use: No    Alcohol/week: 0.0 standard drinks of alcohol   Drug use: No   Sexual activity: Not on file  Other Topics Concern   Not on file  Social History Narrative   Husband treated for testicular cancer, 2 cups coffee a day, no exercise   Social Drivers of Health   Financial Resource Strain: Low Risk  (01/14/2022)   Overall Financial Resource Strain (CARDIA)    Difficulty of Paying Living Expenses: Not very hard  Food Insecurity: No Food Insecurity (06/06/2023)   Hunger Vital Sign    Worried About Running Out of Food in the Last Year: Never true    Ran Out of Food in the Last Year: Never true  Transportation Needs: No Transportation Needs (06/06/2023)   PRAPARE - Administrator, Civil Service (Medical):  No    Lack of Transportation (Non-Medical): No  Physical Activity: Inactive (01/14/2022)   Exercise Vital Sign    Days of Exercise per Week: 0 days    Minutes of Exercise per Session: 0 min  Stress: No Stress Concern Present (01/14/2022)   [employer] of Occupational Health - Occupational Stress Questionnaire    Feeling of Stress : Only a little  Social Connections: Not on file  Intimate Partner Violence: Not At Risk (06/06/2023)   Humiliation, Afraid, Rape, and Kick questionnaire    Fear of Current or Ex-Partner: No    Emotionally Abused: No    Physically Abused: No    Sexually Abused: No    FAMILY HISTORY: Family History  Problem Relation Age of Onset   Clotting disorder Mother    Cirrhosis Father    Diabetes Other        2 aunts   Cancer Other        grandmother   Allergies Other  whole family   Asthma Other        whole family    ALLERGIES:  is allergic to codeine , doxycycline , epinephrine  hcl, erythromycin ethylsuccinate, eucalyptus oil, iohexol , menthol, metformin , oxycodone , and tape.  MEDICATIONS:  Current Outpatient Medications  Medication Sig Dispense Refill   albuterol  (PROAIR  HFA) 108 (90 Base) MCG/ACT inhaler Inhale 2 puffs into the lungs every 6 (six) hours as needed for wheezing or shortness of breath. 8 g 2   atenolol  (TENORMIN ) 25 MG tablet Take 1 tablet (25 mg total) by mouth daily. 30 tablet 0   cetirizine (ZYRTEC) 10 MG tablet Take 10 mg by mouth daily.     Coenzyme Q10 100 MG TABS Take 100 mg by mouth daily.     CRANBERRY PO Take 1,000 mg by mouth in the morning and at bedtime.     D-MANNOSE PO Take 4,000 mg by mouth in the morning and at bedtime.     DULoxetine  (CYMBALTA ) 60 MG capsule Take 1 capsule (60 mg total) by mouth daily. 90 capsule 3   furosemide  (LASIX ) 40 MG tablet Take 40 mg by mouth daily.     hydrOXYzine  (ATARAX ) 25 MG tablet Take 1 tablet (25 mg total) by mouth 2 (two) times daily as needed for anxiety (irritability). 20  tablet 0   insulin  lispro (HUMALOG) 100 UNIT/ML injection Inject 50 Units into the skin daily. Per [employer] insulin  pump     pantoprazole  (PROTONIX ) 40 MG tablet TAKE 1 TABLET BY MOUTH EVERY DAY 30 tablet 0   polyethylene glycol (MIRALAX  / GLYCOLAX ) 17 g packet Take 17 g by mouth daily as needed for mild constipation. 14 each 0   predniSONE  (DELTASONE ) 10 MG tablet Take 10 mg by mouth daily with breakfast.     pregabalin  (LYRICA ) 100 MG capsule TAKE 1 CAPSULE 3 TIMES A DAY 180 capsule 0   traMADol  (ULTRAM ) 50 MG tablet Take 1 tablet (50 mg total) by mouth every 8 (eight) hours as needed for severe pain (pain score 7-10). 20 tablet 0   UNABLE TO FIND Take 1 tablet by mouth in the morning and at bedtime. Med Name: Nervive B Vitamin     No current facility-administered medications for this visit.    REVIEW OF SYSTEMS:   Constitutional: ( - ) fevers, ( - )  chills , ( - ) night sweats Eyes: ( - ) blurriness of vision, ( - ) double vision, ( - ) watery eyes Ears, nose, mouth, throat, and face: ( - ) mucositis, ( - ) sore throat Respiratory: ( - ) cough, ( - ) dyspnea, ( - ) wheezes Cardiovascular: ( - ) palpitation, ( - ) chest discomfort, ( - ) lower extremity swelling Gastrointestinal:  ( - ) nausea, ( - ) heartburn, ( - ) change in bowel habits Skin: ( - ) abnormal skin rashes Lymphatics: ( - ) new lymphadenopathy, ( - ) easy bruising Neurological: ( - ) numbness, ( - ) tingling, ( - ) new weaknesses Behavioral/Psych: ( - ) mood change, ( - ) new changes  All other systems were reviewed with the patient and are negative.  PHYSICAL EXAMINATION:  Vitals:   08/29/23 1214  BP: (!) 139/21  Pulse: (!) 56  Resp: 16  Temp: 97.7 F (36.5 C)  SpO2: 99%    Filed Weights   08/29/23 1214  Weight: 201 lb 14.4 oz (91.6 kg)     GENERAL: Well-appearing elderly Caucasian female, alert, no distress and comfortable SKIN:  skin color, texture, turgor are normal, no rashes or significant  lesions EYES: conjunctiva are pink and non-injected, sclera clear LUNGS: clear to auscultation and percussion with normal breathing effort HEART: regular rate & rhythm and no murmurs and no lower extremity edema Musculoskeletal: no cyanosis of digits and no clubbing  PSYCH: alert & oriented x 3, fluent speech NEURO: no focal motor/sensory deficits  LABORATORY DATA:  I have reviewed the data as listed    Latest Ref Rng & Units 08/29/2023   11:56 AM 06/08/2023    5:47 AM 06/07/2023    5:19 AM  CBC  WBC 4.0 - 10.5 K/uL 8.8  5.5  5.3   Hemoglobin 12.0 - 15.0 g/dL 86.1  87.9  87.5   Hematocrit 36.0 - 46.0 % 43.7  36.6  37.8   Platelets 150 - 400 K/uL 159  76  83        Latest Ref Rng & Units 08/29/2023   11:56 AM 06/08/2023    5:47 AM 06/07/2023    5:19 AM  CMP  Glucose 70 - 99 mg/dL 880  761  738   BUN 8 - 23 mg/dL 27  24  29    Creatinine 0.44 - 1.00 mg/dL 8.72  8.74  8.41   Sodium 135 - 145 mmol/L 142  135  139   Potassium 3.5 - 5.1 mmol/L 4.8  4.3  5.4   Chloride 98 - 111 mmol/L 107  105  107   CO2 22 - 32 mmol/L 31  23  26    Calcium 8.9 - 10.3 mg/dL 9.5  9.1  9.2   Total Protein 6.5 - 8.1 g/dL 6.1     Total Bilirubin 0.0 - 1.2 mg/dL 0.9     Alkaline Phos 38 - 126 U/L 166     AST 15 - 41 U/L 38     ALT 0 - 44 U/L 37      RADIOGRAPHIC STUDIES: I have personally reviewed the radiological images as listed and agreed with the findings in the report: Lung nodules, most prominently in the left lower lung. No results found.  ASSESSMENT & PLAN [name] [name] is a 78 y.o. female with medical history significant for a left renal mass, dilated appendix who presents for a follow up visit.   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  # Pulmonary Nodules  --CT abdomen/pelvis from 05/29/2022 showed findings concerning for renal cell carcinoma.  --underwent robotic assisted laparoscopic appendectomy and nephrectomy joint procedure on 08/14/2022. --Pathology findings are consistent  for a benign appendix but clear-cell RCC of left kidney. --Etiology of the small lung nodules is unclear, though on last CT scan they are decreased in size (01/03/2023).   --Per NCCN guidelines, we will do labs and CT scan every 6 months for 3 years and then annually for up to 5 years. Plan:  --Labs today show white blood cell count 8.8, Hgb 13.8, MCV 95.0, Plt 159.Creatinine stable at 1.27. LFTs in range.  --No clinical signs of recurrence.  --CT CAP from 06/30/2023 showed no evidence of recurrence or metastatic disease. Cavitary nodule in LUL has resolved and other small LLL nodules are unchanged.  --Continue on surveillance and return in 6 months with repeat CT imaging.   #Pancreatic cystic lesions: --Last CT scan from 06/30/2023 showed possibly mild progression of size, although likely benign.  --Continue to monitor.    Orders Placed This Encounter  Procedures   CT CHEST ABDOMEN PELVIS W CONTRAST  Standing Status:   Future    Expected Date:   02/26/2024    Expiration Date:   08/28/2024    Scheduling Instructions:     Schedule one week before appt with heme/onc    If indicated for the ordered procedure, I authorize the administration of contrast media per Radiology protocol:   Yes    Does the patient have a contrast media/X-ray dye allergy?:   No    Preferred imaging location?:   [facility]    If indicated for the ordered procedure, I authorize the administration of oral contrast media per Radiology protocol:   Yes    All questions were answered. The patient knows to call the clinic with any problems, questions or concerns.  A total of more than 30 minutes were spent on this encounter with face-to-face time and non-face-to-face time, including preparing to see the patient, ordering tests and/or medications, counseling the patient and coordination of care as outlined above.   Johnston Police PA-C Dept of Hematology and Oncology [facility] Phone: [phone]   08/29/2023 4:28 PM

## 2023-09-03 ENCOUNTER — Telehealth: Payer: Self-pay | Admitting: Family Medicine

## 2023-09-03 MED ORDER — CEPHALEXIN 500 MG PO CAPS: 500.0000 mg | ORAL_CAPSULE | Freq: Three times a day (TID) | ORAL | 0 refills | Status: AC

## 2023-09-03 NOTE — Telephone Encounter (Signed)
Not sure what happened.. Rx was sent in as below on 08/13/23  cephALEXin (KEFLEX) 500 MG capsule [[id number]]  ENDED   Order Details Dose: 500 mg Route: Oral Frequency: 3 times daily  Dispense Quantity: 21 capsule Refills: 0        Sig: Take 1 capsule (500 mg total) by mouth 3 (three) times daily for 7 days.       Start Date: 08/13/23 End Date: 08/20/23 after 21 doses  Written Date: 08/13/23 Expiration Date: 08/12/24  Providers  Authorizing Provider: Excell Seltzer, MD [address], [place] [address] Phone: [phone]   Fax: [phone] DEA #: [id number]   NPI: [id number]      Ordering User: Excell Seltzer, MD      Pharmacy  [facility] [phone] [place], [place] - [address] [place], [place] [address] Phone: [phone]  Fax: [phone] DEA #: [id number]  DAW Reason: --    I will resend... please verify it is correct pharmacy.

## 2023-09-03 NOTE — Addendum Note (Signed)
Addended by: Kerby Nora E on: 09/03/2023 01:46 PM   Modules accepted: Orders

## 2023-09-03 NOTE — Telephone Encounter (Signed)
Copied from CRM [id number]. Topic: Clinical - Medication Question >> Sep 03, 2023  9:36 AM [name] wrote: Reason for CRM: Patients daughter states nurse dropped off specimen 2 weeks ago at lab, which was positive for bacteria/UTI and was supposed  to have antibiotics called in. They went to pharmacy and no prescription. Daughter not listed as DPR so no information was given.  Lowanda Foster [phone]

## 2023-09-03 NOTE — Telephone Encounter (Signed)
I spoke with [facility] and they states they did not get the Rx for Keflex that was sent in on 08/16/2023 but did get the one sent today.  [name] notified of this via telephone.  She will go by pharmacy to pick up.

## 2023-09-05 DIAGNOSIS — M47896 Other spondylosis, lumbar region: Secondary | ICD-10-CM | POA: Diagnosis not present

## 2023-09-05 DIAGNOSIS — M792 Neuralgia and neuritis, unspecified: Secondary | ICD-10-CM | POA: Diagnosis not present

## 2023-09-05 DIAGNOSIS — M48062 Spinal stenosis, lumbar region with neurogenic claudication: Secondary | ICD-10-CM | POA: Diagnosis not present

## 2023-09-23 DIAGNOSIS — M47816 Spondylosis without myelopathy or radiculopathy, lumbar region: Secondary | ICD-10-CM | POA: Diagnosis not present

## 2023-10-07 DIAGNOSIS — M47896 Other spondylosis, lumbar region: Secondary | ICD-10-CM | POA: Diagnosis not present

## 2023-10-07 DIAGNOSIS — M48062 Spinal stenosis, lumbar region with neurogenic claudication: Secondary | ICD-10-CM | POA: Diagnosis not present

## 2023-10-07 DIAGNOSIS — M545 Low back pain, unspecified: Secondary | ICD-10-CM | POA: Diagnosis not present

## 2023-10-07 DIAGNOSIS — M792 Neuralgia and neuritis, unspecified: Secondary | ICD-10-CM | POA: Diagnosis not present

## 2023-10-10 ENCOUNTER — Other Ambulatory Visit: Payer: Self-pay | Admitting: Cardiovascular Disease

## 2023-10-10 DIAGNOSIS — E1165 Type 2 diabetes mellitus with hyperglycemia: Secondary | ICD-10-CM | POA: Diagnosis not present

## 2023-10-17 ENCOUNTER — Inpatient Hospital Stay (HOSPITAL_COMMUNITY)
Admission: EM | Admit: 2023-10-17 | Discharge: 2023-10-24 | DRG: 073 | Disposition: A | Discharge status: Skilled Nursing Facility | Attending: Internal Medicine | Admitting: Internal Medicine

## 2023-10-17 DIAGNOSIS — F32A Depression, unspecified: Secondary | ICD-10-CM | POA: Diagnosis present

## 2023-10-17 DIAGNOSIS — E1122 Type 2 diabetes mellitus with diabetic chronic kidney disease: Secondary | ICD-10-CM | POA: Diagnosis present

## 2023-10-17 DIAGNOSIS — R918 Other nonspecific abnormal finding of lung field: Secondary | ICD-10-CM | POA: Diagnosis present

## 2023-10-17 DIAGNOSIS — S199XXA Unspecified injury of neck, initial encounter: Secondary | ICD-10-CM | POA: Diagnosis not present

## 2023-10-17 DIAGNOSIS — F0393 Unspecified dementia, unspecified severity, with mood disturbance: Secondary | ICD-10-CM | POA: Diagnosis present

## 2023-10-17 DIAGNOSIS — I5032 Chronic diastolic (congestive) heart failure: Secondary | ICD-10-CM | POA: Diagnosis not present

## 2023-10-17 DIAGNOSIS — M47816 Spondylosis without myelopathy or radiculopathy, lumbar region: Secondary | ICD-10-CM | POA: Diagnosis not present

## 2023-10-17 DIAGNOSIS — Z832 Family history of diseases of the blood and blood-forming organs and certain disorders involving the immune mechanism: Secondary | ICD-10-CM

## 2023-10-17 DIAGNOSIS — I672 Cerebral atherosclerosis: Secondary | ICD-10-CM | POA: Diagnosis not present

## 2023-10-17 DIAGNOSIS — E1149 Type 2 diabetes mellitus with other diabetic neurological complication: Secondary | ICD-10-CM | POA: Diagnosis present

## 2023-10-17 DIAGNOSIS — Z85528 Personal history of other malignant neoplasm of kidney: Secondary | ICD-10-CM

## 2023-10-17 DIAGNOSIS — R000 Tachycardia, unspecified: Secondary | ICD-10-CM | POA: Diagnosis not present

## 2023-10-17 DIAGNOSIS — S3991XA Unspecified injury of abdomen, initial encounter: Secondary | ICD-10-CM | POA: Diagnosis not present

## 2023-10-17 DIAGNOSIS — U071 COVID-19: Secondary | ICD-10-CM | POA: Diagnosis present

## 2023-10-17 DIAGNOSIS — N390 Urinary tract infection, site not specified: Principal | ICD-10-CM | POA: Diagnosis present

## 2023-10-17 DIAGNOSIS — R296 Repeated falls: Secondary | ICD-10-CM | POA: Diagnosis present

## 2023-10-17 DIAGNOSIS — R4182 Altered mental status, unspecified: Secondary | ICD-10-CM | POA: Diagnosis not present

## 2023-10-17 DIAGNOSIS — E7800 Pure hypercholesterolemia, unspecified: Secondary | ICD-10-CM | POA: Diagnosis present

## 2023-10-17 DIAGNOSIS — I700 Atherosclerosis of aorta: Secondary | ICD-10-CM | POA: Diagnosis not present

## 2023-10-17 DIAGNOSIS — I4891 Unspecified atrial fibrillation: Secondary | ICD-10-CM | POA: Diagnosis present

## 2023-10-17 DIAGNOSIS — K589 Irritable bowel syndrome without diarrhea: Secondary | ICD-10-CM | POA: Diagnosis present

## 2023-10-17 DIAGNOSIS — Z79899 Other long term (current) drug therapy: Secondary | ICD-10-CM

## 2023-10-17 DIAGNOSIS — E1143 Type 2 diabetes mellitus with diabetic autonomic (poly)neuropathy: Principal | ICD-10-CM | POA: Diagnosis present

## 2023-10-17 DIAGNOSIS — Z043 Encounter for examination and observation following other accident: Secondary | ICD-10-CM | POA: Diagnosis not present

## 2023-10-17 DIAGNOSIS — R609 Edema, unspecified: Secondary | ICD-10-CM | POA: Diagnosis not present

## 2023-10-17 DIAGNOSIS — I428 Other cardiomyopathies: Secondary | ICD-10-CM | POA: Diagnosis not present

## 2023-10-17 DIAGNOSIS — I951 Orthostatic hypotension: Secondary | ICD-10-CM | POA: Diagnosis not present

## 2023-10-17 DIAGNOSIS — S299XXA Unspecified injury of thorax, initial encounter: Secondary | ICD-10-CM | POA: Diagnosis not present

## 2023-10-17 DIAGNOSIS — R42 Dizziness and giddiness: Secondary | ICD-10-CM | POA: Diagnosis not present

## 2023-10-17 DIAGNOSIS — E1142 Type 2 diabetes mellitus with diabetic polyneuropathy: Secondary | ICD-10-CM | POA: Diagnosis present

## 2023-10-17 DIAGNOSIS — N289 Disorder of kidney and ureter, unspecified: Secondary | ICD-10-CM

## 2023-10-17 DIAGNOSIS — Z881 Allergy status to other antibiotic agents status: Secondary | ICD-10-CM

## 2023-10-17 DIAGNOSIS — S3993XA Unspecified injury of pelvis, initial encounter: Secondary | ICD-10-CM | POA: Diagnosis not present

## 2023-10-17 DIAGNOSIS — F458 Other somatoform disorders: Secondary | ICD-10-CM | POA: Diagnosis present

## 2023-10-17 DIAGNOSIS — M4804 Spinal stenosis, thoracic region: Secondary | ICD-10-CM | POA: Diagnosis not present

## 2023-10-17 DIAGNOSIS — E1165 Type 2 diabetes mellitus with hyperglycemia: Secondary | ICD-10-CM | POA: Diagnosis present

## 2023-10-17 DIAGNOSIS — R7989 Other specified abnormal findings of blood chemistry: Secondary | ICD-10-CM

## 2023-10-17 DIAGNOSIS — Z9049 Acquired absence of other specified parts of digestive tract: Secondary | ICD-10-CM

## 2023-10-17 DIAGNOSIS — R55 Syncope and collapse: Secondary | ICD-10-CM | POA: Diagnosis present

## 2023-10-17 DIAGNOSIS — F419 Anxiety disorder, unspecified: Secondary | ICD-10-CM | POA: Diagnosis present

## 2023-10-17 DIAGNOSIS — E669 Obesity, unspecified: Secondary | ICD-10-CM | POA: Diagnosis present

## 2023-10-17 DIAGNOSIS — M47814 Spondylosis without myelopathy or radiculopathy, thoracic region: Secondary | ICD-10-CM | POA: Diagnosis not present

## 2023-10-17 DIAGNOSIS — K219 Gastro-esophageal reflux disease without esophagitis: Secondary | ICD-10-CM | POA: Diagnosis present

## 2023-10-17 DIAGNOSIS — Z91041 Radiographic dye allergy status: Secondary | ICD-10-CM

## 2023-10-17 DIAGNOSIS — J45909 Unspecified asthma, uncomplicated: Secondary | ICD-10-CM | POA: Diagnosis present

## 2023-10-17 DIAGNOSIS — I130 Hypertensive heart and chronic kidney disease with heart failure and stage 1 through stage 4 chronic kidney disease, or unspecified chronic kidney disease: Secondary | ICD-10-CM | POA: Diagnosis present

## 2023-10-17 DIAGNOSIS — Z905 Acquired absence of kidney: Secondary | ICD-10-CM

## 2023-10-17 DIAGNOSIS — Z888 Allergy status to other drugs, medicaments and biological substances status: Secondary | ICD-10-CM

## 2023-10-17 DIAGNOSIS — W19XXXA Unspecified fall, initial encounter: Principal | ICD-10-CM | POA: Diagnosis present

## 2023-10-17 DIAGNOSIS — M19071 Primary osteoarthritis, right ankle and foot: Secondary | ICD-10-CM | POA: Diagnosis present

## 2023-10-17 DIAGNOSIS — Y92009 Unspecified place in unspecified non-institutional (private) residence as the place of occurrence of the external cause: Principal | ICD-10-CM

## 2023-10-17 DIAGNOSIS — M19041 Primary osteoarthritis, right hand: Secondary | ICD-10-CM | POA: Diagnosis present

## 2023-10-17 DIAGNOSIS — Z7952 Long term (current) use of systemic steroids: Secondary | ICD-10-CM

## 2023-10-17 DIAGNOSIS — M19042 Primary osteoarthritis, left hand: Secondary | ICD-10-CM | POA: Diagnosis present

## 2023-10-17 DIAGNOSIS — M19011 Primary osteoarthritis, right shoulder: Secondary | ICD-10-CM | POA: Diagnosis not present

## 2023-10-17 DIAGNOSIS — M19012 Primary osteoarthritis, left shoulder: Secondary | ICD-10-CM | POA: Diagnosis not present

## 2023-10-17 DIAGNOSIS — M19072 Primary osteoarthritis, left ankle and foot: Secondary | ICD-10-CM | POA: Diagnosis present

## 2023-10-17 DIAGNOSIS — N1831 Chronic kidney disease, stage 3a: Secondary | ICD-10-CM | POA: Diagnosis present

## 2023-10-17 DIAGNOSIS — Z794 Long term (current) use of insulin: Secondary | ICD-10-CM

## 2023-10-17 DIAGNOSIS — K7460 Unspecified cirrhosis of liver: Secondary | ICD-10-CM | POA: Diagnosis present

## 2023-10-17 DIAGNOSIS — S0990XA Unspecified injury of head, initial encounter: Secondary | ICD-10-CM | POA: Diagnosis not present

## 2023-10-17 DIAGNOSIS — M479 Spondylosis, unspecified: Secondary | ICD-10-CM | POA: Diagnosis present

## 2023-10-17 DIAGNOSIS — I6529 Occlusion and stenosis of unspecified carotid artery: Secondary | ICD-10-CM | POA: Diagnosis not present

## 2023-10-17 DIAGNOSIS — Z885 Allergy status to narcotic agent status: Secondary | ICD-10-CM

## 2023-10-17 DIAGNOSIS — Z825 Family history of asthma and other chronic lower respiratory diseases: Secondary | ICD-10-CM

## 2023-10-18 ENCOUNTER — Emergency Department (HOSPITAL_COMMUNITY)

## 2023-10-18 ENCOUNTER — Other Ambulatory Visit: Payer: Self-pay

## 2023-10-18 ENCOUNTER — Encounter (HOSPITAL_COMMUNITY): Payer: Self-pay | Admitting: Emergency Medicine

## 2023-10-18 DIAGNOSIS — S299XXA Unspecified injury of thorax, initial encounter: Secondary | ICD-10-CM | POA: Diagnosis not present

## 2023-10-18 DIAGNOSIS — R42 Dizziness and giddiness: Secondary | ICD-10-CM

## 2023-10-18 DIAGNOSIS — R55 Syncope and collapse: Secondary | ICD-10-CM | POA: Diagnosis present

## 2023-10-18 DIAGNOSIS — E1143 Type 2 diabetes mellitus with diabetic autonomic (poly)neuropathy: Secondary | ICD-10-CM | POA: Diagnosis not present

## 2023-10-18 DIAGNOSIS — J45909 Unspecified asthma, uncomplicated: Secondary | ICD-10-CM | POA: Diagnosis not present

## 2023-10-18 DIAGNOSIS — S3991XA Unspecified injury of abdomen, initial encounter: Secondary | ICD-10-CM | POA: Diagnosis not present

## 2023-10-18 DIAGNOSIS — I130 Hypertensive heart and chronic kidney disease with heart failure and stage 1 through stage 4 chronic kidney disease, or unspecified chronic kidney disease: Secondary | ICD-10-CM | POA: Diagnosis not present

## 2023-10-18 DIAGNOSIS — M47816 Spondylosis without myelopathy or radiculopathy, lumbar region: Secondary | ICD-10-CM | POA: Diagnosis not present

## 2023-10-18 DIAGNOSIS — M47814 Spondylosis without myelopathy or radiculopathy, thoracic region: Secondary | ICD-10-CM | POA: Diagnosis not present

## 2023-10-18 DIAGNOSIS — N390 Urinary tract infection, site not specified: Secondary | ICD-10-CM | POA: Diagnosis not present

## 2023-10-18 DIAGNOSIS — K7460 Unspecified cirrhosis of liver: Secondary | ICD-10-CM | POA: Diagnosis not present

## 2023-10-18 DIAGNOSIS — M19012 Primary osteoarthritis, left shoulder: Secondary | ICD-10-CM | POA: Diagnosis not present

## 2023-10-18 DIAGNOSIS — M19011 Primary osteoarthritis, right shoulder: Secondary | ICD-10-CM | POA: Diagnosis not present

## 2023-10-18 DIAGNOSIS — M4804 Spinal stenosis, thoracic region: Secondary | ICD-10-CM | POA: Diagnosis not present

## 2023-10-18 DIAGNOSIS — F0393 Unspecified dementia, unspecified severity, with mood disturbance: Secondary | ICD-10-CM | POA: Diagnosis not present

## 2023-10-18 DIAGNOSIS — N1831 Chronic kidney disease, stage 3a: Secondary | ICD-10-CM | POA: Diagnosis not present

## 2023-10-18 DIAGNOSIS — I700 Atherosclerosis of aorta: Secondary | ICD-10-CM | POA: Diagnosis not present

## 2023-10-18 DIAGNOSIS — I672 Cerebral atherosclerosis: Secondary | ICD-10-CM | POA: Diagnosis not present

## 2023-10-18 DIAGNOSIS — S0990XA Unspecified injury of head, initial encounter: Secondary | ICD-10-CM | POA: Diagnosis not present

## 2023-10-18 DIAGNOSIS — F32A Depression, unspecified: Secondary | ICD-10-CM | POA: Diagnosis not present

## 2023-10-18 DIAGNOSIS — I6529 Occlusion and stenosis of unspecified carotid artery: Secondary | ICD-10-CM | POA: Diagnosis not present

## 2023-10-18 DIAGNOSIS — Z043 Encounter for examination and observation following other accident: Secondary | ICD-10-CM | POA: Diagnosis not present

## 2023-10-18 DIAGNOSIS — I5032 Chronic diastolic (congestive) heart failure: Secondary | ICD-10-CM | POA: Diagnosis not present

## 2023-10-18 DIAGNOSIS — S199XXA Unspecified injury of neck, initial encounter: Secondary | ICD-10-CM | POA: Diagnosis not present

## 2023-10-18 DIAGNOSIS — E1149 Type 2 diabetes mellitus with other diabetic neurological complication: Secondary | ICD-10-CM | POA: Diagnosis not present

## 2023-10-18 DIAGNOSIS — E669 Obesity, unspecified: Secondary | ICD-10-CM | POA: Diagnosis not present

## 2023-10-18 DIAGNOSIS — S3993XA Unspecified injury of pelvis, initial encounter: Secondary | ICD-10-CM | POA: Diagnosis not present

## 2023-10-18 DIAGNOSIS — U071 COVID-19: Secondary | ICD-10-CM | POA: Diagnosis not present

## 2023-10-18 LAB — CBC WITH DIFFERENTIAL/PLATELET
Abs Immature Granulocytes: 0.03 10*3/uL (ref 0.00–0.07)
Basophils Absolute: 0 10*3/uL (ref 0.0–0.1)
Basophils Relative: 0 %
Eosinophils Absolute: 0 10*3/uL (ref 0.0–0.5)
Eosinophils Relative: 0 %
HCT: 40.5 % (ref 36.0–46.0)
Hemoglobin: 13.5 g/dL (ref 12.0–15.0)
Immature Granulocytes: 0 %
Lymphocytes Relative: 17 %
Lymphs Abs: 1.3 10*3/uL (ref 0.7–4.0)
MCH: 30.1 pg (ref 26.0–34.0)
MCHC: 33.3 g/dL (ref 30.0–36.0)
MCV: 90.2 fL (ref 80.0–100.0)
Monocytes Absolute: 0.6 10*3/uL (ref 0.1–1.0)
Monocytes Relative: 8 %
Neutro Abs: 5.4 10*3/uL (ref 1.7–7.7)
Neutrophils Relative %: 75 %
Platelets: 140 10*3/uL — ABNORMAL LOW (ref 150–400)
RBC: 4.49 MIL/uL (ref 3.87–5.11)
RDW: 13.2 % (ref 11.5–15.5)
WBC: 7.3 10*3/uL (ref 4.0–10.5)
nRBC: 0 % (ref 0.0–0.2)

## 2023-10-18 LAB — COMPREHENSIVE METABOLIC PANEL WITH GFR
ALT: 43 U/L (ref 0–44)
AST: 43 U/L — ABNORMAL HIGH (ref 15–41)
Albumin: 3.4 g/dL — ABNORMAL LOW (ref 3.5–5.0)
Alkaline Phosphatase: 128 U/L — ABNORMAL HIGH (ref 38–126)
Anion gap: 14 (ref 5–15)
BUN: 27 mg/dL — ABNORMAL HIGH (ref 8–23)
CO2: 22 mmol/L (ref 22–32)
Calcium: 9.3 mg/dL (ref 8.9–10.3)
Chloride: 98 mmol/L (ref 98–111)
Creatinine, Ser: 1.23 mg/dL — ABNORMAL HIGH (ref 0.44–1.00)
GFR, Estimated: 45 mL/min — ABNORMAL LOW (ref >60)
Glucose, Bld: 151 mg/dL — ABNORMAL HIGH (ref 70–99)
Potassium: 3.5 mmol/L (ref 3.5–5.1)
Sodium: 134 mmol/L — ABNORMAL LOW (ref 135–145)
Total Bilirubin: 1.4 mg/dL — ABNORMAL HIGH (ref 0.0–1.2)
Total Protein: 6.4 g/dL — ABNORMAL LOW (ref 6.5–8.1)

## 2023-10-18 LAB — URINALYSIS, ROUTINE W REFLEX MICROSCOPIC
Bilirubin Urine: NEGATIVE
Glucose, UA: >=500 mg/dL — AB
Hgb urine dipstick: NEGATIVE
Ketones, ur: NEGATIVE mg/dL
Nitrite: NEGATIVE
Protein, ur: NEGATIVE mg/dL
Specific Gravity, Urine: 1.017 (ref 1.005–1.030)
pH: 5 (ref 5.0–8.0)

## 2023-10-18 LAB — GLUCOSE, CAPILLARY
Glucose-Capillary: 206 mg/dL — ABNORMAL HIGH (ref 70–99)
Glucose-Capillary: 247 mg/dL — ABNORMAL HIGH (ref 70–99)
Glucose-Capillary: 294 mg/dL — ABNORMAL HIGH (ref 70–99)
Glucose-Capillary: 238 mg/dL — ABNORMAL HIGH (ref 70–99)

## 2023-10-18 LAB — SARS CORONAVIRUS 2 BY RT PCR: SARS Coronavirus 2 by RT PCR: POSITIVE — AB

## 2023-10-18 LAB — MAGNESIUM: Magnesium: 2.1 mg/dL (ref 1.7–2.4)

## 2023-10-18 MED ORDER — ONDANSETRON HCL 4 MG/2ML IJ SOLN
4.0000 mg | Freq: Four times a day (QID) | INTRAMUSCULAR | Status: DC | PRN
Start: 2023-10-18 — End: 2023-10-25

## 2023-10-18 MED ORDER — HYDROXYZINE HCL 25 MG PO TABS: 25.0000 mg | ORAL_TABLET | Freq: Two times a day (BID) | ORAL | Status: DC | PRN

## 2023-10-18 MED ORDER — ACETAMINOPHEN 650 MG RE SUPP: 650.0000 mg | Freq: Four times a day (QID) | RECTAL | Status: DC | PRN

## 2023-10-18 MED ORDER — INSULIN ASPART 100 UNIT/ML IJ SOLN
0.0000 [IU] | Freq: Every day | INTRAMUSCULAR | Status: DC
  Administered 2023-10-18 – 2023-10-19 (×2): 2 [IU] via SUBCUTANEOUS
  Administered 2023-10-20: 5 [IU] via SUBCUTANEOUS
  Administered 2023-10-23: 3 [IU] via SUBCUTANEOUS
  Administered 2023-10-24: 4 [IU] via SUBCUTANEOUS

## 2023-10-18 MED ORDER — SODIUM CHLORIDE 0.9 % IV SOLN
2.0000 g | Freq: Once | INTRAVENOUS | Status: AC
  Administered 2023-10-18: 2 g via INTRAVENOUS
  Filled 2023-10-18: qty 20

## 2023-10-18 MED ORDER — COENZYME Q10 100 MG PO TABS: 100.0000 mg | ORAL_TABLET | Freq: Every day | ORAL | Status: DC

## 2023-10-18 MED ORDER — ONDANSETRON HCL 4 MG PO TABS: 4.0000 mg | ORAL_TABLET | Freq: Four times a day (QID) | ORAL | Status: DC | PRN

## 2023-10-18 MED ORDER — TRAMADOL HCL 50 MG PO TABS
50.0000 mg | ORAL_TABLET | Freq: Three times a day (TID) | ORAL | Status: DC | PRN
  Administered 2023-10-18 – 2023-10-24 (×7): 50 mg via ORAL
  Filled 2023-10-18 (×7): qty 1

## 2023-10-18 MED ORDER — ACETAMINOPHEN 325 MG PO TABS
650.0000 mg | ORAL_TABLET | Freq: Four times a day (QID) | ORAL | Status: DC | PRN
  Administered 2023-10-18: 650 mg via ORAL
  Filled 2023-10-18 (×2): qty 2

## 2023-10-18 MED ORDER — PREDNISONE 10 MG PO TABS
10.0000 mg | ORAL_TABLET | Freq: Every day | ORAL | Status: DC
  Administered 2023-10-19 – 2023-10-24 (×6): 10 mg via ORAL
  Filled 2023-10-18 (×7): qty 1

## 2023-10-18 MED ORDER — HEPARIN SODIUM (PORCINE) 5000 UNIT/ML IJ SOLN
5000.0000 [IU] | Freq: Three times a day (TID) | INTRAMUSCULAR | Status: DC
  Administered 2023-10-18 – 2023-10-24 (×20): 5000 [IU] via SUBCUTANEOUS
  Filled 2023-10-18 (×19): qty 1

## 2023-10-18 MED ORDER — TRAMADOL HCL 50 MG PO TABS
25.0000 mg | ORAL_TABLET | Freq: Once | ORAL | Status: AC
  Administered 2023-10-18: 25 mg via ORAL
  Filled 2023-10-18: qty 1

## 2023-10-18 MED ORDER — INSULIN ASPART 100 UNIT/ML IJ SOLN
0.0000 [IU] | Freq: Three times a day (TID) | INTRAMUSCULAR | Status: DC
  Administered 2023-10-18: 8 [IU] via SUBCUTANEOUS
  Administered 2023-10-18: 5 [IU] via SUBCUTANEOUS
  Administered 2023-10-19 (×2): 8 [IU] via SUBCUTANEOUS
  Administered 2023-10-19: 11 [IU] via SUBCUTANEOUS
  Administered 2023-10-20 (×3): 3 [IU] via SUBCUTANEOUS
  Administered 2023-10-21: 5 [IU] via SUBCUTANEOUS
  Administered 2023-10-21: 15 [IU] via SUBCUTANEOUS
  Administered 2023-10-22: 8 [IU] via SUBCUTANEOUS
  Administered 2023-10-22: 11 [IU] via SUBCUTANEOUS
  Administered 2023-10-23: 8 [IU] via SUBCUTANEOUS
  Administered 2023-10-23: 5 [IU] via SUBCUTANEOUS
  Administered 2023-10-24: 8 [IU] via SUBCUTANEOUS
  Administered 2023-10-24: 15 [IU] via SUBCUTANEOUS

## 2023-10-18 MED ORDER — DULOXETINE HCL 60 MG PO CPEP
60.0000 mg | ORAL_CAPSULE | Freq: Every day | ORAL | Status: DC
  Administered 2023-10-18 – 2023-10-24 (×7): 60 mg via ORAL
  Filled 2023-10-18 (×7): qty 1

## 2023-10-18 MED ORDER — FUROSEMIDE 40 MG PO TABS
40.0000 mg | ORAL_TABLET | Freq: Every day | ORAL | Status: DC
  Administered 2023-10-18 – 2023-10-19 (×2): 40 mg via ORAL
  Filled 2023-10-18 (×2): qty 1

## 2023-10-18 MED ORDER — DILTIAZEM LOAD VIA INFUSION
15.0000 mg | Freq: Once | INTRAVENOUS | Status: DC
  Filled 2023-10-18: qty 15

## 2023-10-18 MED ORDER — PANTOPRAZOLE SODIUM 40 MG PO TBEC
40.0000 mg | DELAYED_RELEASE_TABLET | Freq: Every day | ORAL | Status: DC
  Administered 2023-10-18 – 2023-10-24 (×7): 40 mg via ORAL
  Filled 2023-10-18 (×7): qty 1

## 2023-10-18 MED ORDER — DILTIAZEM HCL-DEXTROSE 125-5 MG/125ML-% IV SOLN (PREMIX)
5.0000 mg/h | INTRAVENOUS | Status: DC
  Filled 2023-10-18: qty 125

## 2023-10-18 MED ORDER — SODIUM CHLORIDE 0.9 % IV SOLN: 1.0000 g | INTRAVENOUS | Status: DC

## 2023-10-18 MED ORDER — ALBUTEROL SULFATE (2.5 MG/3ML) 0.083% IN NEBU: 2.5000 mg | INHALATION_SOLUTION | RESPIRATORY_TRACT | Status: DC | PRN

## 2023-10-18 MED ORDER — INSULIN GLARGINE-YFGN 100 UNIT/ML ~~LOC~~ SOLN
10.0000 [IU] | Freq: Two times a day (BID) | SUBCUTANEOUS | Status: DC
  Administered 2023-10-18 – 2023-10-19 (×4): 10 [IU] via SUBCUTANEOUS
  Filled 2023-10-18 (×5): qty 0.1

## 2023-10-18 MED ORDER — LORATADINE 10 MG PO TABS
10.0000 mg | ORAL_TABLET | Freq: Every day | ORAL | Status: DC
  Administered 2023-10-18 – 2023-10-24 (×7): 10 mg via ORAL
  Filled 2023-10-18 (×7): qty 1

## 2023-10-18 NOTE — ED Triage Notes (Signed)
 Pt BIB [facility] for fall, pt fell on right side. Now c/o right shoulder pain, right knee/foot pain. Per EMS pt was able to bear weight while standing. Pt endorses dizziness x1 week, says her family is getting over covid. CBG 95 vss  PTA: 20g R AC

## 2023-10-18 NOTE — ED Provider Notes (Signed)
 [place] EMERGENCY DEPARTMENT AT [facility] Provider Note   CSN: [id number] Arrival date & time: 10/17/23  2358     History  Chief Complaint  Patient presents with   [name]    [name] [name] is a 78 y.o. female.  The history is provided by the patient.  Fall  She has history of diabetes, renal cell carcinoma, heart failure, asthma, cirrhosis and comes in following a fall at home.  She relates that for about the last 3 weeks she has noted dizziness when she stands up and she had a fall about 1 week ago in which she injured both shoulders and both hips.  Tonight, she states that she had both sides of her head on the door frame and is complaining of pain in her head and upper back.  She denies chest pain, heaviness, tightness, pressure.  Of note, she had run out of her metoprolol about 2 or 3 weeks ago and has not been able to get the prescription refilled so she has not taken it.  She is not on any anticoagulants.   Home Medications Prior to Admission medications   Medication Sig Start Date End Date Taking? Authorizing Provider  albuterol (PROAIR HFA) 108 (90 Base) MCG/ACT inhaler Inhale 2 puffs into the lungs every 6 (six) hours as needed for wheezing or shortness of breath. 06/13/23   Bedsole, Amy E, MD  atenolol (TENORMIN) 25 MG tablet TAKE 1 TABLET (25 MG TOTAL) BY MOUTH DAILY. 10/10/23   Runell Gess, MD  cetirizine (ZYRTEC) 10 MG tablet Take 10 mg by mouth daily.    [provider]  Coenzyme Q10 100 MG TABS Take 100 mg by mouth daily.    [provider]  CRANBERRY PO Take 1,000 mg by mouth in the morning and at bedtime.    [provider]  D-MANNOSE PO Take 4,000 mg by mouth in the morning and at bedtime.    [provider]  DULoxetine (CYMBALTA) 60 MG capsule Take 1 capsule (60 mg total) by mouth daily. 07/11/23   Bedsole, Amy E, MD  furosemide (LASIX) 40 MG tablet Take 40 mg by mouth daily.    [provider]   hydrOXYzine (ATARAX) 25 MG tablet Take 1 tablet (25 mg total) by mouth 2 (two) times daily as needed for anxiety (irritability). 06/13/23   Bedsole, Amy E, MD  insulin lispro (HUMALOG) 100 UNIT/ML injection Inject 50 Units into the skin daily. Per [employer]tronic insulin pump    [provider]  pantoprazole (PROTONIX) 40 MG tablet TAKE 1 TABLET BY MOUTH EVERY DAY 09/17/19   Wendling, Jilda Roche, DO  polyethylene glycol (MIRALAX / GLYCOLAX) 17 g packet Take 17 g by mouth daily as needed for mild constipation. 06/09/23   Hongalgi, Maximino Greenland, MD  predniSONE (DELTASONE) 10 MG tablet Take 10 mg by mouth daily with breakfast.    [provider]  pregabalin (LYRICA) 100 MG capsule TAKE 1 CAPSULE 3 TIMES A DAY 08/12/23   Bedsole, Amy E, MD  traMADol (ULTRAM) 50 MG tablet Take 1 tablet (50 mg total) by mouth every 8 (eight) hours as needed for severe pain (pain score 7-10). 06/13/23   Bedsole, Amy E, MD  UNABLE TO FIND Take 1 tablet by mouth in the morning and at bedtime. Med Name: Cory Roughen Vitamin    [provider]      Allergies    Codeine, Doxycycline, Epinephrine hcl, Erythromycin ethylsuccinate, Eucalyptus oil, Iohexol, Menthol, Metformin, Oxycodone,  and Tape    Review of Systems   Review of Systems  All other systems reviewed and are negative.   Physical Exam Updated Vital Signs BP 128/76 (BP Location: Left Wrist)   Pulse 90   Temp 98.2 F (36.8 C) (Oral)   Resp 16   SpO2 99%  Physical Exam Vitals and nursing note reviewed.   78 year old female, resting comfortably and in no acute distress. Vital signs are significant for borderline low blood pressure. Oxygen saturation is 100%, which is normal. Head is normocephalic and atraumatic. PERRLA, EOMI. Oropharynx is clear. Neck is mildly tender diffusely. Back is moderately tender across the mid back without point tenderness.  There is no lumbar spine tenderness Lungs are clear without rales, wheezes, or  rhonchi. Chest is nontender. Heart has regular rate and rhythm without murmur. Abdomen is soft, flat, nontender. Extremities have no cyanosis or edema, full range of motion is present.  There is tenderness to palpation over both shoulders, no tenderness to palpation over the hips or knees or feet.  There is 1+ pitting edema. Skin is warm and dry without rash. Neurologic: Mental status is normal, cranial nerves are intact, moves all extremities equally.  ED Results / Procedures / Treatments   Labs (all labs ordered are listed, but only abnormal results are displayed) Labs Reviewed  COMPREHENSIVE METABOLIC PANEL WITH GFR - Abnormal; Notable for the following components:      Result Value   Sodium 134 (*)    Glucose, Bld 151 (*)    BUN 27 (*)    Creatinine, Ser 1.23 (*)    Total Protein 6.4 (*)    Albumin 3.4 (*)    AST 43 (*)    Alkaline Phosphatase 128 (*)    Total Bilirubin 1.4 (*)    GFR, Estimated 45 (*)    All other components within normal limits  CBC WITH DIFFERENTIAL/PLATELET - Abnormal; Notable for the following components:   Platelets 140 (*)    All other components within normal limits  URINALYSIS, ROUTINE W REFLEX MICROSCOPIC - Abnormal; Notable for the following components:   Color, Urine AMBER (*)    APPearance HAZY (*)    Glucose, UA >=500 (*)    Leukocytes,Ua MODERATE (*)    Bacteria, UA MANY (*)    All other components within normal limits  MAGNESIUM    EKG EKG Interpretation Date/Time:  Saturday October 18 2023 02:28:53 EDT Ventricular Rate:  93 PR Interval:  160 QRS Duration:  90 QT Interval:  429 QTC Calculation: 534 R Axis:   -25  Text Interpretation: Sinus rhythm Borderline left axis deviation Prolonged QT interval When compared with ECG of 04/26/2023, QT has lengthened Confirmed by Dione Booze ([id number]) on 10/18/2023 2:39:49 AM  Radiology DG Foot Complete Right Result Date: 10/18/2023 CLINICAL DATA:  Fall EXAM: RIGHT FOOT COMPLETE - 3+ VIEW  COMPARISON:  Right foot MRI 12/24/2014 FINDINGS: Screw fixation of the calcaneus. No acute fracture or dislocation. Soft tissues are unremarkable. IMPRESSION: No acute fracture or dislocation. Electronically Signed   By: Minerva Fester M.D.   On: 10/18/2023 02:18   DG Knee Complete 4 Views Right Result Date: 10/18/2023 CLINICAL DATA:  Fall EXAM: RIGHT KNEE - COMPLETE 4+ VIEW COMPARISON:  None Available. FINDINGS: No acute fracture or dislocation. No knee joint effusion. Vascular calcifications. IMPRESSION: Negative. Electronically Signed   By: Minerva Fester M.D.   On: 10/18/2023 02:16   DG HIP UNILAT WITH PELVIS 2-3 VIEWS RIGHT Result  Date: 10/18/2023 CLINICAL DATA:  Fall EXAM: DG HIP (WITH OR WITHOUT PELVIS) 2-3V RIGHT COMPARISON:  Radiographs heel in [id number] FINDINGS: No acute fracture or dislocation. Degenerative changes pubic symphysis, both hips, SI joints and lower lumbar spine. IMPRESSION: No acute fracture or dislocation. Electronically Signed   By: Minerva Fester M.D.   On: 10/18/2023 02:14   DG Shoulder Right Result Date: 10/18/2023 CLINICAL DATA:  Fall EXAM: RIGHT SHOULDER - 2+ VIEW; LEFT SHOULDER - 2+ VIEW COMPARISON:  None. FINDINGS: No acute fracture or dislocation of the bilateral shoulders. Degenerative arthritis both AC joints. Chronic changes about the distal right clavicle. Linear calcification superior to the right humeral head likely due to calcific tendinopathy. IMPRESSION: No acute fracture or dislocation. Electronically Signed   By: Minerva Fester M.D.   On: 10/18/2023 02:13   DG Shoulder Left Result Date: 10/18/2023 CLINICAL DATA:  Fall EXAM: RIGHT SHOULDER - 2+ VIEW; LEFT SHOULDER - 2+ VIEW COMPARISON:  None. FINDINGS: No acute fracture or dislocation of the bilateral shoulders. Degenerative arthritis both AC joints. Chronic changes about the distal right clavicle. Linear calcification superior to the right humeral head likely due to calcific tendinopathy. IMPRESSION: No  acute fracture or dislocation. Electronically Signed   By: Minerva Fester M.D.   On: 10/18/2023 02:13   CT CHEST ABDOMEN PELVIS WO CONTRAST Result Date: 10/18/2023 CLINICAL DATA:  Polytrauma, blunt.  Fall EXAM: CT CHEST, ABDOMEN AND PELVIS WITHOUT CONTRAST TECHNIQUE: Multidetector CT imaging of the chest, abdomen and pelvis was performed following the standard protocol without IV contrast. RADIATION DOSE REDUCTION: This exam was performed according to the departmental dose-optimization program which includes automated exposure control, adjustment of the mA and/or kV according to patient size and/or use of iterative reconstruction technique. COMPARISON:  06/30/2023 FINDINGS: CT CHEST FINDINGS Cardiovascular: No significant vascular findings. Normal heart size. No pericardial effusion. Mild atherosclerotic calcification within the thoracic aorta. Mediastinum/Nodes: No enlarged mediastinal, hilar, or axillary lymph nodes. Thyroid gland, trachea, and esophagus demonstrate no significant findings. Lungs/Pleura: Branching nodular density is again seen within the medial right lower lobe at axial image # 118/7 and sagittal image # 89/11 compatible with a impacted terminal. Associated bronchial wall thickening is present keeping with airway inflammation. There is stable mild bibasilar cylindrical bronchiectasis. Additional mucous plugging lateral segment of the left is also unchanged. Stable 4 mm subpleural pulmonary nodule within the left lower lobe, axial image # 63/7. Stable minimal parenchymal subpleural scarring within the right lung base. No focal pulmonary infiltrate. No pneumothorax or pleural effusion. No central obstructing lesion. Musculoskeletal: No acute bone abnormality. No lytic or blastic bone lesion. CT ABDOMEN PELVIS FINDINGS Hepatobiliary: Status post cholecystectomy. No intra or extrahepatic biliary ductal dilation. Nodular contour of the liver with relative hypertrophy of the left hepatic lobe noted  in keeping with changes of cirrhosis. Pancreas: Multiple stable cysts are seen within the pancreatic body and tail measuring up to 13 mm best seen on image # 64/3, stable since remote PET CT 09/26/2022 four views are not metabolically active. The pancreas is otherwise unremarkable. Spleen: Unremarkable Adrenals/Urinary Tract: Status post left radical nephrectomy. Residual right adrenal gland and right kidney unremarkable. Bladder is unremarkable. Stomach/Bowel: Moderate pancolonic diverticulosis. Stomach, small bowel, and large bowel are otherwise unremarkable. Appendix absent. No evidence of obstruction or focal inflammation. No free intraperitoneal gas or fluid. Vascular/Lymphatic: Aortic atherosclerosis. No enlarged abdominal or pelvic lymph nodes. Reproductive: Status post hysterectomy. No adnexal masses. Other: No abdominal wall hernia or abnormality. No abdominopelvic ascites.  Musculoskeletal: Osseous structures are age-appropriate. No acute bone abnormality. No lytic or blastic bone lesion. IMPRESSION: 1. No acute intrathoracic or intra-abdominal injury. 2. Stable branching nodular density within the lower lobes bilaterally compatible with impacted terminal bronchi. Associated bronchial wall thickening is present keeping with airway inflammation. 3. Stable 4 mm subpleural pulmonary nodule within the left lower lobe. 4. Moderate pancolonic diverticulosis without superimposed acute inflammatory change. 5. Multiple stable cysts within the pancreatic body and tail measuring up to 13 mm, stable since remote PET CT 09/26/2022 and not metabolically active. Attention on follow-up imaging is recommended. 6. Status post left radical nephrectomy. 7. Nodular contour of the liver with relative hypertrophy of the left hepatic lobe noted in keeping with changes of cirrhosis. Aortic Atherosclerosis (ICD10-I70.0). Electronically Signed   By: Helyn Numbers M.D.   On: 10/18/2023 02:09   CT T-SPINE NO CHARGE Result Date:  10/18/2023 CLINICAL DATA:  Fall, blunt back trauma EXAM: CT THORACIC SPINE WITHOUT CONTRAST TECHNIQUE: Multidetector CT images of the thoracic were obtained using the standard protocol without intravenous contrast. RADIATION DOSE REDUCTION: This exam was performed according to the departmental dose-optimization program which includes automated exposure control, adjustment of the mA and/or kV according to patient size and/or use of iterative reconstruction technique. COMPARISON:  None Available. FINDINGS: Alignment: Normal. Vertebrae: No acute fracture or focal pathologic process. Bridging disc osteophytes are seen throughout the mid and lower lumbar spine in keeping with changes of diffuse idiopathic skeletal hyperostosis. Paraspinal and other soft tissues: Negative. Disc levels: Intervertebral disc heights are preserved. No significant canal stenosis. 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 IMPRESSION: 1. No acute fracture or listhesis of the thoracic spine. 2. 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 Electronically Signed   By: Helyn Numbers M.D.   On: 10/18/2023 01:54   CT Head Wo Contrast Result Date: 10/18/2023 CLINICAL DATA:  Head and neck trauma.  Fall. EXAM: CT HEAD WITHOUT CONTRAST CT CERVICAL SPINE WITHOUT CONTRAST TECHNIQUE: Multidetector CT imaging of the head and cervical spine was performed following the standard protocol without intravenous contrast. Multiplanar CT image reconstructions of the cervical spine were also generated. RADIATION DOSE REDUCTION: This exam was performed according to the departmental dose-optimization program which includes automated exposure control, adjustment of the mA and/or kV according to patient size and/or use of iterative reconstruction technique. COMPARISON:  MRI head 08/10/2004 and MR cervical spine 12/28/2004 FINDINGS: CT HEAD FINDINGS Brain: No intracranial hemorrhage, mass  effect, or evidence of acute infarct. No hydrocephalus. No extra-axial fluid collection. Age related cerebral atrophy and chronic small vessel ischemic disease. Vascular: No hyperdense vessel. Intracranial arterial calcification. Skull: No fracture or focal lesion. Sinuses/Orbits: No acute finding. Other: None. CT CERVICAL SPINE FINDINGS Alignment: No evidence of traumatic malalignment. Skull base and vertebrae: No acute fracture. No primary bone lesion or focal pathologic process. Soft tissues and spinal canal: No prevertebral fluid or swelling. No visible canal hematoma. Disc levels: Multilevel spondylosis and facet arthropathy. No severe spinal canal narrowing. Upper chest: No acute abnormality. Other: Carotid calcification. IMPRESSION: 1. No acute intracranial abnormality. 2. No acute fracture in the cervical spine. Electronically Signed   By: Minerva Fester M.D.   On: 10/18/2023 01:44   CT Cervical Spine Wo Contrast Result Date: 10/18/2023 CLINICAL DATA:  Head and neck trauma.  Fall. EXAM: CT HEAD WITHOUT CONTRAST CT CERVICAL SPINE WITHOUT CONTRAST TECHNIQUE: Multidetector CT imaging of the head and cervical spine was performed following  the standard protocol without intravenous contrast. Multiplanar CT image reconstructions of the cervical spine were also generated. RADIATION DOSE REDUCTION: This exam was performed according to the departmental dose-optimization program which includes automated exposure control, adjustment of the mA and/or kV according to patient size and/or use of iterative reconstruction technique. COMPARISON:  MRI head 08/10/2004 and MR cervical spine 12/28/2004 FINDINGS: CT HEAD FINDINGS Brain: No intracranial hemorrhage, mass effect, or evidence of acute infarct. No hydrocephalus. No extra-axial fluid collection. Age related cerebral atrophy and chronic small vessel ischemic disease. Vascular: No hyperdense vessel. Intracranial arterial calcification. Skull: No fracture or focal  lesion. Sinuses/Orbits: No acute finding. Other: None. CT CERVICAL SPINE FINDINGS Alignment: No evidence of traumatic malalignment. Skull base and vertebrae: No acute fracture. No primary bone lesion or focal pathologic process. Soft tissues and spinal canal: No prevertebral fluid or swelling. No visible canal hematoma. Disc levels: Multilevel spondylosis and facet arthropathy. No severe spinal canal narrowing. Upper chest: No acute abnormality. Other: Carotid calcification. IMPRESSION: 1. No acute intracranial abnormality. 2. No acute fracture in the cervical spine. Electronically Signed   By: Minerva Fester M.D.   On: 10/18/2023 01:44    Procedures Procedures    Medications Ordered in ED Medications - No data to display  ED Course/ Medical Decision Making/ A&P                                 Medical Decision Making Amount and/or Complexity of Data Reviewed Labs: ordered. Radiology: ordered.  Risk Decision regarding hospitalization.   Several falls from what sounds like orthostatic near syncope.  I do not see obvious signs of serious injury on exam, but but I feel x-rays are needed.  I have ordered CT scans of head, cervical spine, chest/abdomen/pelvis with reconstruction of thoracic spine 9.  Have ordered plain x-rays of both shoulders, right knee, right foot.  I have also ordered an electrocardiogram, CBC, comprehensive metabolic panel, urinalysis.  I have reviewed her lab results and my interpretation is stable renal insufficiency, borderline hyponatremia which is not felt to be clinically significant, borderline elevated AST and bilirubin not felt to be clinically significant, borderline thrombocytopenia which is not felt to be clinically significant, urinary tract infection with 21-50 WBCs and many bacteria as well as hyaline casts.  UTI may be behind her orthostasis.  Orthostatic vital signs show significant rise in heart rate although blood pressure did not drop.  I do not feel she  is safe for discharge because she is at significant risk for fall anytime she stands.  I have ordered antibiotics for her urinary tract infection.  Have discussed case with Dr. Toniann Fail of [facility], who agrees to admit the patient.  Final Clinical Impression(s) / ED Diagnoses Final diagnoses:  Fall in home, initial encounter  Orthostatic dizziness  Renal insufficiency  Elevated liver function tests  Urinary tract infection without hematuria, site unspecified    Rx / DC Orders ED Discharge Orders     None         Dione Booze, MD 10/18/23 [phone]

## 2023-10-18 NOTE — H&P (Signed)
 History and Physical  [name] [name] ZOX:[id number] DOB: [birth year] DOA: 10/17/2023  PCP: Excell Seltzer, MD   Chief Complaint: Fall at home  HPI: [name] [name] is a 78 y.o. female with medical history significant for insulin-dependent diabetes, autoimmune cirrhosis, congestive heart failure, depression being admitted to the hospital with orthostasis and fall at home, found to have COVID infection and suspected UTI.  History is provided by the patient, tells me that she lives by herself, has been weak for the last week or so.  Denies any fevers, but has been having chills and night sweats.  Denies any cough or shortness of breath.  Some family members recently had COVID.  States that she intermittently has dysuria, has been having that for the last week as well.  She called EMS last night because she fell while at home by herself, mainly has right shoulder right knee and foot pain.  Workup as detailed below positive for COVID-19 infection, abnormal urinalysis, CT scans and chest x-rays do not show any acute fracture.  Review of Systems: Please see HPI for pertinent positives and negatives. A complete 10 system review of systems are otherwise negative.  Past Medical History:  Diagnosis Date   Allergic rhinitis, cause unspecified    Anxiety    Arthritis    hands, feet and back   Asthma    Benign neoplasm of colon    Bursitis    hips   CHF (congestive heart failure) (HCC)    Cirrhosis (HCC)    auto immune   Complication of anesthesia    " i WAKE UP WITH TREMORS " after shoulder surgery tremors for 10 months after surgery surgery- 2009- surgical center in [place]   Cyst and pseudocyst of pancreas    Depressive disorder, not elsewhere classified    Diabetes mellitus without complication (HCC)    Diverticulitis    Diverticulosis of colon (without mention of hemorrhage)    Dyspnea    Dysrhythmia    arrhythmia   Esophageal reflux    Essential and other specified forms of tremor     History of GI bleed    History of kidney stones    Irritable bowel syndrome    Obesity, unspecified    Other specified cardiac dysrhythmias(427.89)    Periapical abscess without sinus    Polyneuropathy in diabetes(357.2)    PONV (postoperative nausea and vomiting)    Pure hypercholesterolemia    Renal cell carcinoma (HCC)    Sleep apnea    CPAP- non compliant   Type II or unspecified type diabetes mellitus with neurological manifestations, not stated as uncontrolled(250.60)    Past Surgical History:  Procedure Laterality Date   ABDOMINAL HYSTERECTOMY     CALCANEAL OSTEOTOMY Right 07/27/2015   Procedure: RIGHT CALCANEAL OSTEOTOMY ;  Surgeon: Toni Arthurs, MD;  Location: [facility];  Service: Orthopedics;  Laterality: Right;   CHOLECYSTECTOMY     15-20 years ago   COLONOSCOPY WITH PROPOFOL N/A 08/17/2019   Procedure: COLONOSCOPY WITH PROPOFOL;  Surgeon: Jeani Hawking, MD;  Location: [facility] END[facility]COPY;  Service: Endoscopy;  Laterality: N/A;   COLONOSCOPY WITH PROPOFOL N/A 06/21/2022   Procedure: COLONOSCOPY WITH PROPOFOL;  Surgeon: Jeani Hawking, MD;  Location: [facility] END[facility]COPY;  Service: Gastroenterology;  Laterality: N/A;   ESOPHAGOGASTRODUODENOSCOPY (EGD) WITH PROPOFOL N/A 08/17/2019   Procedure: ESOPHAGOGASTRODUODENOSCOPY (EGD) WITH PROPOFOL;  Surgeon: Jeani Hawking, MD;  Location: [facility] ENDOSCOPY;  Service: Endoscopy;  Laterality: N/A;   GASTROCNEMIUS RECESSION Right 07/27/2015   Procedure: RIGHT  GASTROC RECESSION;  Surgeon: Toni Arthurs, MD;  Location: [facility];  Service: Orthopedics;  Laterality: Right;   KIDNEY STONE SURGERY  1980's   removal   LITHOTRIPSY     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   PARTIAL HYSTERECTOMY  1990's   POLYPECTOMY  08/17/2019   Procedure: POLYPECTOMY;  Surgeon: Jeani Hawking, MD;  Location: [facility] ENDOSCOPY;  Service: Endoscopy;;   POLYPECTOMY  06/21/2022   Procedure: POLYPECTOMY;  Surgeon: Jeani Hawking, MD;  Location: [facility];  Service: Gastroenterology;;    ROBOT ASSISTED LAPAROSCOPIC NEPHRECTOMY Left 08/14/2022   Procedure: XI ROBOTIC ASSISTED LAPAROSCOPIC NEPHRECTOMY;  Surgeon: Rene Paci, MD;  Location: [facility];  Service: Urology;  Laterality: Left;   ROTATOR CUFF REPAIR  2005   XI ROBOTIC LAPAROSCOPIC ASSISTED APPENDECTOMY N/A 08/14/2022   Procedure: XI ROBOTIC LAPAROSCOPIC ASSISTED APPENDECTOMY;  Surgeon: Romie Levee, MD;  Location: [facility];  Service: General;  Laterality: N/A;   Social History:  reports that she has never smoked. She has never used smokeless tobacco. She reports that she does not drink alcohol and does not use drugs.  Allergies  Allergen Reactions   Codeine Other (See Comments)    Unknown    Doxycycline Hives   Epinephrine Hcl Other (See Comments)    Had tremors when it was given.   Erythromycin Ethylsuccinate Other (See Comments)    Abdominal spasms   Eucalyptus Oil Other (See Comments)    Unknown    Iohexol Hives    Pt needs premedicated-- hives on prev contrast study per md office    Menthol Other (See Comments)    Unknown    Metformin Diarrhea   Oxycodone Other (See Comments)    "felt like I was dying"   Tape Rash    Family History  Problem Relation Age of Onset   Clotting disorder Mother    Cirrhosis Father    Diabetes Other        2 aunts   Cancer Other        grandmother   Allergies Other        "whole family"   Asthma Other        "whole family"     Prior to Admission medications   Medication Sig Start Date End Date Taking? Authorizing Provider  albuterol (PROAIR HFA) 108 (90 Base) MCG/ACT inhaler Inhale 2 puffs into the lungs every 6 (six) hours as needed for wheezing or shortness of breath. 06/13/23  Yes Bedsole, Amy E, MD  atenolol (TENORMIN) 25 MG tablet TAKE 1 TABLET (25 MG TOTAL) BY MOUTH DAILY. 10/10/23  Yes Runell Gess, MD  cetirizine (ZYRTEC) 10 MG tablet Take 10 mg by mouth daily.   Yes [provider]  Coenzyme Q10 100 MG TABS Take 100 mg by mouth  daily.   Yes [provider]  CRANBERRY PO Take 1,000 mg by mouth in the morning and at bedtime.   Yes [provider]  D-MANNOSE PO Take 4,000 mg by mouth in the morning and at bedtime.   Yes [provider]  DULoxetine (CYMBALTA) 60 MG capsule Take 1 capsule (60 mg total) by mouth daily. 07/11/23  Yes Bedsole, Amy E, MD  furosemide (LASIX) 40 MG tablet Take 40 mg by mouth daily.   Yes [provider]  hydrOXYzine (ATARAX) 25 MG tablet Take 1 tablet (25 mg total) by mouth 2 (two) times daily as needed for anxiety (irritability). 06/13/23  Yes Bedsole, Amy E, MD  insulin lispro (HUMALOG)  100 UNIT/ML injection Inject 50 Units into the skin daily. Per [employer] insulin pump   Yes [provider]  pantoprazole (PROTONIX) 40 MG tablet TAKE 1 TABLET BY MOUTH EVERY DAY 09/17/19  Yes Wendling, Jilda Roche, DO  polyethylene glycol (MIRALAX / GLYCOLAX) 17 g packet Take 17 g by mouth daily as needed for mild constipation. 06/09/23  Yes Hongalgi, Maximino Greenland, MD  predniSONE (DELTASONE) 10 MG tablet Take 10 mg by mouth daily with breakfast.   Yes [provider]  traMADol (ULTRAM) 50 MG tablet Take 1 tablet (50 mg total) by mouth every 8 (eight) hours as needed for severe pain (pain score 7-10). 06/13/23  Yes Bedsole, Amy E, MD  UNABLE TO FIND Take 1 tablet by mouth in the morning and at bedtime. Med Name: Cory Roughen Vitamin   Yes [provider]    Physical Exam: BP 134/75 (BP Location: Right Arm)   Pulse 94   Temp 98.5 F (36.9 C) (Oral)   Resp 16   Ht 5\' 3"  (1.6 m)   Wt 85.1 kg   SpO2 100%   BMI 33.23 kg/m  General:  Alert, oriented, calm, in no acute distress, resting comfortably on room air, speaking in full sentences, no cough Cardiovascular: RRR, no murmurs or rubs, no peripheral edema  Respiratory: clear to auscultation bilaterally, no wheezes, no crackles  Abdomen: soft, nontender, nondistended, normal bowel tones heard  Skin: dry,  no rashes  Musculoskeletal: no joint effusions, normal range of motion  Psychiatric: appropriate affect, normal speech  Neurologic: extraocular muscles intact, clear speech, moving all extremities with intact sensorium         Labs on Admission:  Basic Metabolic Panel: Recent Labs  Lab 10/18/23 0218  NA 134*  K 3.5  CL 98  CO2 22  GLUCOSE 151*  BUN 27*  CREATININE 1.23*  CALCIUM 9.3   Liver Function Tests: Recent Labs  Lab 10/18/23 0218  AST 43*  ALT 43  ALKPHOS 128*  BILITOT 1.4*  PROT 6.4*  ALBUMIN 3.4*   No results for input(s): "LIPASE", "AMYLASE" in the last 168 hours. No results for input(s): "AMMONIA" in the last 168 hours. CBC: Recent Labs  Lab 10/18/23 0218  WBC 7.3  NEUTROABS 5.4  HGB 13.5  HCT 40.5  MCV 90.2  PLT 140*   Cardiac Enzymes: No results for input(s): "CKTOTAL", "CKMB", "CKMBINDEX", "TROPONINI" in the last 168 hours. BNP (last 3 results) No results for input(s): "BNP" in the last 8760 hours.  ProBNP (last 3 results) No results for input(s): "PROBNP" in the last 8760 hours.  CBG: No results for input(s): "GLUCAP" in the last 168 hours.  Radiological Exams on Admission: DG Foot Complete Right Result Date: 10/18/2023 CLINICAL DATA:  Fall EXAM: RIGHT FOOT COMPLETE - 3+ VIEW COMPARISON:  Right foot MRI 12/24/2014 FINDINGS: Screw fixation of the calcaneus. No acute fracture or dislocation. Soft tissues are unremarkable. IMPRESSION: No acute fracture or dislocation. Electronically Signed   By: Minerva Fester M.D.   On: 10/18/2023 02:18   DG Knee Complete 4 Views Right Result Date: 10/18/2023 CLINICAL DATA:  Fall EXAM: RIGHT KNEE - COMPLETE 4+ VIEW COMPARISON:  None Available. FINDINGS: No acute fracture or dislocation. No knee joint effusion. Vascular calcifications. IMPRESSION: Negative. Electronically Signed   By: Minerva Fester M.D.   On: 10/18/2023 02:16   DG HIP UNILAT WITH PELVIS 2-3 VIEWS RIGHT Result Date: 10/18/2023 CLINICAL  DATA:  Fall EXAM: DG HIP (WITH OR WITHOUT PELVIS)  2-3V RIGHT COMPARISON:  Radiographs heel in [id number] FINDINGS: No acute fracture or dislocation. Degenerative changes pubic symphysis, both hips, SI joints and lower lumbar spine. IMPRESSION: No acute fracture or dislocation. Electronically Signed   By: Minerva Fester M.D.   On: 10/18/2023 02:14   DG Shoulder Right Result Date: 10/18/2023 CLINICAL DATA:  Fall EXAM: RIGHT SHOULDER - 2+ VIEW; LEFT SHOULDER - 2+ VIEW COMPARISON:  None. FINDINGS: No acute fracture or dislocation of the bilateral shoulders. Degenerative arthritis both AC joints. Chronic changes about the distal right clavicle. Linear calcification superior to the right humeral head likely due to calcific tendinopathy. IMPRESSION: No acute fracture or dislocation. Electronically Signed   By: Minerva Fester M.D.   On: 10/18/2023 02:13   DG Shoulder Left Result Date: 10/18/2023 CLINICAL DATA:  Fall EXAM: RIGHT SHOULDER - 2+ VIEW; LEFT SHOULDER - 2+ VIEW COMPARISON:  None. FINDINGS: No acute fracture or dislocation of the bilateral shoulders. Degenerative arthritis both AC joints. Chronic changes about the distal right clavicle. Linear calcification superior to the right humeral head likely due to calcific tendinopathy. IMPRESSION: No acute fracture or dislocation. Electronically Signed   By: Minerva Fester M.D.   On: 10/18/2023 02:13   CT CHEST ABDOMEN PELVIS WO CONTRAST Result Date: 10/18/2023 CLINICAL DATA:  Polytrauma, blunt.  Fall EXAM: CT CHEST, ABDOMEN AND PELVIS WITHOUT CONTRAST TECHNIQUE: Multidetector CT imaging of the chest, abdomen and pelvis was performed following the standard protocol without IV contrast. RADIATION DOSE REDUCTION: This exam was performed according to the departmental dose-optimization program which includes automated exposure control, adjustment of the mA and/or kV according to patient size and/or use of iterative reconstruction technique. COMPARISON:  06/30/2023  FINDINGS: CT CHEST FINDINGS Cardiovascular: No significant vascular findings. Normal heart size. No pericardial effusion. Mild atherosclerotic calcification within the thoracic aorta. Mediastinum/Nodes: No enlarged mediastinal, hilar, or axillary lymph nodes. Thyroid gland, trachea, and esophagus demonstrate no significant findings. Lungs/Pleura: Branching nodular density is again seen within the medial right lower lobe at axial image # 118/7 and sagittal image # 89/11 compatible with a impacted terminal. Associated bronchial wall thickening is present keeping with airway inflammation. There is stable mild bibasilar cylindrical bronchiectasis. Additional mucous plugging lateral segment of the left is also unchanged. Stable 4 mm subpleural pulmonary nodule within the left lower lobe, axial image # 63/7. Stable minimal parenchymal subpleural scarring within the right lung base. No focal pulmonary infiltrate. No pneumothorax or pleural effusion. No central obstructing lesion. Musculoskeletal: No acute bone abnormality. No lytic or blastic bone lesion. CT ABDOMEN PELVIS FINDINGS Hepatobiliary: Status post cholecystectomy. No intra or extrahepatic biliary ductal dilation. Nodular contour of the liver with relative hypertrophy of the left hepatic lobe noted in keeping with changes of cirrhosis. Pancreas: Multiple stable cysts are seen within the pancreatic body and tail measuring up to 13 mm best seen on image # 64/3, stable since remote PET CT 09/26/2022 four views are not metabolically active. The pancreas is otherwise unremarkable. Spleen: Unremarkable Adrenals/Urinary Tract: Status post left radical nephrectomy. Residual right adrenal gland and right kidney unremarkable. Bladder is unremarkable. Stomach/Bowel: Moderate pancolonic diverticulosis. Stomach, small bowel, and large bowel are otherwise unremarkable. Appendix absent. No evidence of obstruction or focal inflammation. No free intraperitoneal gas or fluid.  Vascular/Lymphatic: Aortic atherosclerosis. No enlarged abdominal or pelvic lymph nodes. Reproductive: Status post hysterectomy. No adnexal masses. Other: No abdominal wall hernia or abnormality. No abdominopelvic ascites. Musculoskeletal: Osseous structures are age-appropriate. No acute bone abnormality. No lytic or blastic  bone lesion. IMPRESSION: 1. No acute intrathoracic or intra-abdominal injury. 2. Stable branching nodular density within the lower lobes bilaterally compatible with impacted terminal bronchi. Associated bronchial wall thickening is present keeping with airway inflammation. 3. Stable 4 mm subpleural pulmonary nodule within the left lower lobe. 4. Moderate pancolonic diverticulosis without superimposed acute inflammatory change. 5. Multiple stable cysts within the pancreatic body and tail measuring up to 13 mm, stable since remote PET CT 09/26/2022 and not metabolically active. Attention on follow-up imaging is recommended. 6. Status post left radical nephrectomy. 7. Nodular contour of the liver with relative hypertrophy of the left hepatic lobe noted in keeping with changes of cirrhosis. Aortic Atherosclerosis (ICD10-I70.0). Electronically Signed   By: Helyn Numbers M.D.   On: 10/18/2023 02:09   CT T-SPINE NO CHARGE Result Date: 10/18/2023 CLINICAL DATA:  Fall, blunt back trauma EXAM: CT THORACIC SPINE WITHOUT CONTRAST TECHNIQUE: Multidetector CT images of the thoracic were obtained using the standard protocol without intravenous contrast. RADIATION DOSE REDUCTION: This exam was performed according to the departmental dose-optimization program which includes automated exposure control, adjustment of the mA and/or kV according to patient size and/or use of iterative reconstruction technique. COMPARISON:  None Available. FINDINGS: Alignment: Normal. Vertebrae: No acute fracture or focal pathologic process. Bridging disc osteophytes are seen throughout the mid and lower lumbar spine in keeping  with changes of diffuse idiopathic skeletal hyperostosis. Paraspinal and other soft tissues: Negative. Disc levels: Intervertebral disc heights are preserved. No significant canal stenosis. 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 IMPRESSION: 1. No acute fracture or listhesis of the thoracic spine. 2. 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 Electronically Signed   By: Helyn Numbers M.D.   On: 10/18/2023 01:54   CT Head Wo Contrast Result Date: 10/18/2023 CLINICAL DATA:  Head and neck trauma.  Fall. EXAM: CT HEAD WITHOUT CONTRAST CT CERVICAL SPINE WITHOUT CONTRAST TECHNIQUE: Multidetector CT imaging of the head and cervical spine was performed following the standard protocol without intravenous contrast. Multiplanar CT image reconstructions of the cervical spine were also generated. RADIATION DOSE REDUCTION: This exam was performed according to the departmental dose-optimization program which includes automated exposure control, adjustment of the mA and/or kV according to patient size and/or use of iterative reconstruction technique. COMPARISON:  MRI head 08/10/2004 and MR cervical spine 12/28/2004 FINDINGS: CT HEAD FINDINGS Brain: No intracranial hemorrhage, mass effect, or evidence of acute infarct. No hydrocephalus. No extra-axial fluid collection. Age related cerebral atrophy and chronic small vessel ischemic disease. Vascular: No hyperdense vessel. Intracranial arterial calcification. Skull: No fracture or focal lesion. Sinuses/Orbits: No acute finding. Other: None. CT CERVICAL SPINE FINDINGS Alignment: No evidence of traumatic malalignment. Skull base and vertebrae: No acute fracture. No primary bone lesion or focal pathologic process. Soft tissues and spinal canal: No prevertebral fluid or swelling. No visible canal hematoma. Disc levels: Multilevel spondylosis and facet arthropathy. No severe spinal canal narrowing.  Upper chest: No acute abnormality. Other: Carotid calcification. IMPRESSION: 1. No acute intracranial abnormality. 2. No acute fracture in the cervical spine. Electronically Signed   By: Minerva Fester M.D.   On: 10/18/2023 01:44   CT Cervical Spine Wo Contrast Result Date: 10/18/2023 CLINICAL DATA:  Head and neck trauma.  Fall. EXAM: CT HEAD WITHOUT CONTRAST CT CERVICAL SPINE WITHOUT CONTRAST TECHNIQUE: Multidetector CT imaging of the head and cervical spine was performed following the standard protocol without intravenous contrast. Multiplanar CT image reconstructions of the cervical  spine were also generated. RADIATION DOSE REDUCTION: This exam was performed according to the departmental dose-optimization program which includes automated exposure control, adjustment of the mA and/or kV according to patient size and/or use of iterative reconstruction technique. COMPARISON:  MRI head 08/10/2004 and MR cervical spine 12/28/2004 FINDINGS: CT HEAD FINDINGS Brain: No intracranial hemorrhage, mass effect, or evidence of acute infarct. No hydrocephalus. No extra-axial fluid collection. Age related cerebral atrophy and chronic small vessel ischemic disease. Vascular: No hyperdense vessel. Intracranial arterial calcification. Skull: No fracture or focal lesion. Sinuses/Orbits: No acute finding. Other: None. CT CERVICAL SPINE FINDINGS Alignment: No evidence of traumatic malalignment. Skull base and vertebrae: No acute fracture. No primary bone lesion or focal pathologic process. Soft tissues and spinal canal: No prevertebral fluid or swelling. No visible canal hematoma. Disc levels: Multilevel spondylosis and facet arthropathy. No severe spinal canal narrowing. Upper chest: No acute abnormality. Other: Carotid calcification. IMPRESSION: 1. No acute intracranial abnormality. 2. No acute fracture in the cervical spine. Electronically Signed   By: Minerva Fester M.D.   On: 10/18/2023 01:44   Assessment/Plan [name] [name] is a 78 y.o. female with medical history significant for insulin-dependent diabetes, autoimmune cirrhosis, congestive heart failure, depression being admitted to the hospital with orthostasis and fall at home, found to have COVID infection and suspected UTI.   Orthostasis and fall at home-I suspect this is due to combination of COVID-19 infection, UTI. -Observation admission -PT/OT -Hold antihypertensives -Treat etiologies as below  COVID-19-she had a recent family member with COVID, she is asymptomatic otherwise, but this could be contributing to her weakness and falls at home.  She does not qualify for any COVID-specific therapy.  UTI-suspected due to abnormal urinalysis, and dysuria.  No leukocytosis or evidence of sepsis.  Unfortunately urine culture was not obtained prior to antibiotics. -Continue empiric IV Rocephin  Insulin-dependent type 2 diabetes-her insulin pump has been removed. -Carb modified diet -Update hemoglobin A1c -Start low-dose basal insulin with moderate dose sliding scale  Depression-Cymbalta  Renal cell carcinoma-status post left renal resection January 2024.  DVT prophylaxis: Heparin subcu    Code Status: Full Code  Consults called: None  Admission status: Observation  Time spent: 48 minutes  Armstrong Creasy Sharlette Dense MD [facility] Pager [phone]  If 7PM-7AM, please contact night-coverage [email or web address] Password [email or web address]  10/18/2023, 8:30 AM

## 2023-10-18 NOTE — Progress Notes (Signed)
 Pt went into new onset afib with heart rate to 120s, hospitalist and charge nurse notified.

## 2023-10-18 NOTE — Care Management Obs Status (Signed)
 MEDICARE OBSERVATION STATUS NOTIFICATION   Patient Details  Name: Blondina Coderre MRN: [id number] Date of Birth: November 09, 1945   Medicare Observation Status Notification Given:  Yes    Howell Rucks, RN 10/18/2023, 1:47 PM

## 2023-10-18 NOTE — TOC Initial Note (Signed)
 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 MOON reviewed and explained, pt voiced understanding, gave verbal ok for NCM to complete/sign form. Copy provided to patient at  bedside. TOC will continue to follow.                  Expected Discharge Plan: Home/Self Care Barriers to Discharge: Continued Medical Work up   Patient Goals and CMS Choice Patient states their goals for this hospitalization and ongoing recovery are:: return home          Expected Discharge Plan and Services       Living arrangements for the past 2 months: Single Family Home                                      Prior Living Arrangements/Services Living arrangements for the past 2 months: Single Family Home Lives with:: Self Patient language and need for interpreter reviewed:: Yes Do you feel safe going back to the place where you live?: Yes      Need for Family Participation in Patient Care: Yes (Comment) Care giver support system in place?: Yes (comment) Current home services: DME ([employer]) Criminal Activity/Legal Involvement Pertinent to Current Situation/Hospitalization: No - Comment as needed  Activities of Daily Living   ADL Screening (condition at time of admission) Independently performs ADLs?: No Does the patient have a NEW difficulty with bathing/dressing/toileting/self-feeding that is expected to last >3 days?: Yes (Initiates electronic notice to provider  for possible OT consult) Does the patient have a NEW difficulty with getting in/out of bed, walking, or climbing stairs that is expected to last >3 days?: Yes (Initiates electronic notice to provider for possible PT consult) Does the patient have a NEW difficulty with communication that is expected to last >3 days?: No Is the patient deaf or have difficulty hearing?: No Does the patient have difficulty seeing, even when wearing glasses/contacts?: No Does the patient have difficulty concentrating, remembering, or making decisions?: No  Permission Sought/Granted                  Emotional Assessment   Attitude/Demeanor/Rapport: Gracious   Orientation: : Oriented to Self, Oriented to Place, Oriented to  Time, Oriented to Situation Alcohol / Substance Use: Not Applicable Psych Involvement: No (comment)  Admission diagnosis:  Dizziness [R42] Renal insufficiency [N28.9] Elevated liver function tests [R79.89] Near syncope [R55] Fall in home, initial encounter [W19.XXXA, Y92.009] Orthostatic dizziness [R42] Urinary tract infection without hematuria, site unspecified [N39.0] Patient Active Problem List   Diagnosis Date Noted   Dizziness 10/18/2023   Near syncope 10/18/2023   MDD (major depressive disorder), recurrent episode, moderate (HCC) 07/11/2023   Confusion 07/08/2023   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   Acute on chronic low back pain 06/08/2023   Intractable low back pain 06/06/2023   CKD stage 3a, GFR 45-59 ml/min (  HCC) 06/06/2023   Lumbar radiculopathy 06/06/2023   Cirrhosis (HCC)    Renal cell cancer (HCC) 09/12/2022   Aortic atherosclerosis (HCC) 06/04/2022   Renal mass, left 05/27/2022   Swelling of limb 04/12/2022   Osteopenia 09/21/2021   History of nephrolithiasis 09/21/2021   Sensation of pressure in bladder area 09/21/2021   Anemia 08/17/2019   Hypertension 08/17/2019   Anxiety 08/17/2019   Lower GI bleeding 08/17/2019   Acute lower GI bleeding  08/16/2019   Autoimmune hepatitis (HCC) 12/29/2018   Chest pain 12/22/2018   Asthmatic bronchitis with exacerbation 03/21/2017   Elevated LFTs 12/24/2016   SVT (supraventricular tachycardia) (HCC) 12/24/2016   Peripheral edema 12/24/2016   S/P foot surgery, right 07/27/2015   Preoperative clearance 06/29/2015   Angioedema 08/30/2014   Recurrent UTI 01/06/2014   Chronic insomnia 01/06/2014   Acute bilateral low back pain without sciatica 03/07/2011   Vitamin D deficiency 08/20/2010   TINNITUS, CHRONIC, 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   Lung nodule 08/03/2007   CYST AND PSEUDOCYST OF PANCREAS 08/03/2007   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   TUBULOVILLOUS ADENOMA, COLON 12/30/2006   HYPERCHOLESTEROLEMIA 12/30/2006   OBESITY 12/30/2006   TREMOR, ESSENTIAL 12/30/2006   Palpitations 12/30/2006   Seasonal and perennial allergic rhinitis 12/30/2006   GERD 12/30/2006   DIVERTICULOSIS, COLON 12/30/2006   IBS 12/30/2006   Obstructive sleep apnea 12/30/2006   URINARY INCONTINENCE 12/30/2006   Poorly controlled type 2 diabetes mellitus (HCC) 12/22/2006   PCP:  Excell Seltzer, MD Pharmacy:   [facility] [phone] [place], [place] - [address] [place] [address] Phone: [phone] Fax: [phone]     Social Drivers of Health (SDOH) Social History: SDOH Screenings   Food Insecurity: No Food Insecurity (10/18/2023)  Housing: Low Risk  (10/18/2023)  Transportation Needs: No Transportation Needs (10/18/2023)  Utilities: Not At Risk (10/18/2023)  Depression (PHQ2-9): High Risk (07/11/2023)  Financial Resource Strain: Low Risk  (01/14/2022)  Physical Activity: Inactive (01/14/2022)  Social Connections: Moderately Integrated (10/18/2023)  Stress: No Stress Concern Present (01/14/2022)  Tobacco Use: Low Risk  (10/18/2023)   SDOH Interventions:     Readmission Risk Interventions     No data to display

## 2023-10-18 NOTE — Plan of Care (Signed)
  Problem: Education: Goal: Knowledge of General Education information will improve Description: Including pain rating scale, medication(s)/side effects and non-pharmacologic comfort measures Outcome: Progressing   Problem: Health Behavior/Discharge Planning: Goal: Ability to manage health-related needs will improve Outcome: Progressing   Problem: Clinical Measurements: Goal: Ability to maintain clinical measurements within normal limits will improve Outcome: Progressing Goal: Will remain free from infection Outcome: Progressing Goal: Diagnostic test results will improve Outcome: Progressing Goal: Respiratory complications will improve Outcome: Progressing Goal: Cardiovascular complication will be avoided Outcome: Progressing   Problem: Activity: Goal: Risk for activity intolerance will decrease Outcome: Progressing   Problem: Nutrition: Goal: Adequate nutrition will be maintained Outcome: Progressing   Problem: Coping: Goal: Level of anxiety will decrease Outcome: Progressing   Problem: Elimination: Goal: Will not experience complications related to bowel motility Outcome: Progressing Goal: Will not experience complications related to urinary retention Outcome: Progressing   Problem: Pain Managment: Goal: General experience of comfort will improve and/or be controlled Outcome: Progressing   Problem: Safety: Goal: Ability to remain free from injury will improve Outcome: Progressing   Problem: Skin Integrity: Goal: Risk for impaired skin integrity will decrease Outcome: Progressing   Problem: Education: Goal: Knowledge of risk factors and measures for prevention of condition will improve Outcome: Progressing   Problem: Coping: Goal: Psychosocial and spiritual needs will be supported Outcome: Progressing   Problem: Respiratory: Goal: Will maintain a patent airway Outcome: Progressing Goal: Complications related to the disease process, condition or treatment  will be avoided or minimized Outcome: Progressing   Problem: Education: Goal: Ability to describe self-care measures that may prevent or decrease complications (Diabetes Survival Skills Education) will improve Outcome: Progressing Goal: Individualized Educational Video(s) Outcome: Progressing   Problem: Coping: Goal: Ability to adjust to condition or change in health will improve Outcome: Progressing   Problem: Fluid Volume: Goal: Ability to maintain a balanced intake and output will improve Outcome: Progressing   Problem: Health Behavior/Discharge Planning: Goal: Ability to identify and utilize available resources and services will improve Outcome: Progressing Goal: Ability to manage health-related needs will improve Outcome: Progressing   Problem: Metabolic: Goal: Ability to maintain appropriate glucose levels will improve Outcome: Progressing   Problem: Nutritional: Goal: Maintenance of adequate nutrition will improve Outcome: Progressing Goal: Progress toward achieving an optimal weight will improve Outcome: Progressing   Problem: Skin Integrity: Goal: Risk for impaired skin integrity will decrease Outcome: Progressing   Problem: Tissue Perfusion: Goal: Adequacy of tissue perfusion will improve Outcome: Progressing

## 2023-10-19 DIAGNOSIS — I700 Atherosclerosis of aorta: Secondary | ICD-10-CM | POA: Diagnosis not present

## 2023-10-19 DIAGNOSIS — M19012 Primary osteoarthritis, left shoulder: Secondary | ICD-10-CM | POA: Diagnosis not present

## 2023-10-19 DIAGNOSIS — F32A Depression, unspecified: Secondary | ICD-10-CM | POA: Diagnosis not present

## 2023-10-19 DIAGNOSIS — E7800 Pure hypercholesterolemia, unspecified: Secondary | ICD-10-CM | POA: Diagnosis not present

## 2023-10-19 DIAGNOSIS — K7460 Unspecified cirrhosis of liver: Secondary | ICD-10-CM | POA: Diagnosis not present

## 2023-10-19 DIAGNOSIS — S3991XA Unspecified injury of abdomen, initial encounter: Secondary | ICD-10-CM | POA: Diagnosis not present

## 2023-10-19 DIAGNOSIS — N1831 Chronic kidney disease, stage 3a: Secondary | ICD-10-CM | POA: Diagnosis not present

## 2023-10-19 DIAGNOSIS — M19071 Primary osteoarthritis, right ankle and foot: Secondary | ICD-10-CM | POA: Diagnosis not present

## 2023-10-19 DIAGNOSIS — F331 Major depressive disorder, recurrent, moderate: Secondary | ICD-10-CM | POA: Diagnosis not present

## 2023-10-19 DIAGNOSIS — S3993XA Unspecified injury of pelvis, initial encounter: Secondary | ICD-10-CM | POA: Diagnosis not present

## 2023-10-19 DIAGNOSIS — M6281 Muscle weakness (generalized): Secondary | ICD-10-CM | POA: Diagnosis not present

## 2023-10-19 DIAGNOSIS — M19041 Primary osteoarthritis, right hand: Secondary | ICD-10-CM | POA: Diagnosis not present

## 2023-10-19 DIAGNOSIS — I672 Cerebral atherosclerosis: Secondary | ICD-10-CM | POA: Diagnosis not present

## 2023-10-19 DIAGNOSIS — E1122 Type 2 diabetes mellitus with diabetic chronic kidney disease: Secondary | ICD-10-CM | POA: Diagnosis not present

## 2023-10-19 DIAGNOSIS — S199XXA Unspecified injury of neck, initial encounter: Secondary | ICD-10-CM | POA: Diagnosis not present

## 2023-10-19 DIAGNOSIS — I4891 Unspecified atrial fibrillation: Secondary | ICD-10-CM | POA: Diagnosis not present

## 2023-10-19 DIAGNOSIS — S0990XA Unspecified injury of head, initial encounter: Secondary | ICD-10-CM | POA: Diagnosis not present

## 2023-10-19 DIAGNOSIS — R42 Dizziness and giddiness: Secondary | ICD-10-CM | POA: Diagnosis not present

## 2023-10-19 DIAGNOSIS — G9089 Other disorders of autonomic nervous system: Secondary | ICD-10-CM | POA: Diagnosis not present

## 2023-10-19 DIAGNOSIS — R2689 Other abnormalities of gait and mobility: Secondary | ICD-10-CM | POA: Diagnosis not present

## 2023-10-19 DIAGNOSIS — I130 Hypertensive heart and chronic kidney disease with heart failure and stage 1 through stage 4 chronic kidney disease, or unspecified chronic kidney disease: Secondary | ICD-10-CM | POA: Diagnosis not present

## 2023-10-19 DIAGNOSIS — E1149 Type 2 diabetes mellitus with other diabetic neurological complication: Secondary | ICD-10-CM | POA: Diagnosis not present

## 2023-10-19 DIAGNOSIS — E1165 Type 2 diabetes mellitus with hyperglycemia: Secondary | ICD-10-CM | POA: Diagnosis not present

## 2023-10-19 DIAGNOSIS — K219 Gastro-esophageal reflux disease without esophagitis: Secondary | ICD-10-CM | POA: Diagnosis not present

## 2023-10-19 DIAGNOSIS — R296 Repeated falls: Secondary | ICD-10-CM | POA: Diagnosis not present

## 2023-10-19 DIAGNOSIS — M6259 Muscle wasting and atrophy, not elsewhere classified, multiple sites: Secondary | ICD-10-CM | POA: Diagnosis not present

## 2023-10-19 DIAGNOSIS — Z043 Encounter for examination and observation following other accident: Secondary | ICD-10-CM | POA: Diagnosis not present

## 2023-10-19 DIAGNOSIS — I6529 Occlusion and stenosis of unspecified carotid artery: Secondary | ICD-10-CM | POA: Diagnosis not present

## 2023-10-19 DIAGNOSIS — I5032 Chronic diastolic (congestive) heart failure: Secondary | ICD-10-CM | POA: Diagnosis not present

## 2023-10-19 DIAGNOSIS — J45909 Unspecified asthma, uncomplicated: Secondary | ICD-10-CM | POA: Diagnosis not present

## 2023-10-19 DIAGNOSIS — I428 Other cardiomyopathies: Secondary | ICD-10-CM | POA: Diagnosis not present

## 2023-10-19 DIAGNOSIS — U071 COVID-19: Secondary | ICD-10-CM | POA: Diagnosis not present

## 2023-10-19 DIAGNOSIS — M19042 Primary osteoarthritis, left hand: Secondary | ICD-10-CM | POA: Diagnosis not present

## 2023-10-19 DIAGNOSIS — N390 Urinary tract infection, site not specified: Secondary | ICD-10-CM | POA: Diagnosis not present

## 2023-10-19 DIAGNOSIS — M4804 Spinal stenosis, thoracic region: Secondary | ICD-10-CM | POA: Diagnosis not present

## 2023-10-19 DIAGNOSIS — Z794 Long term (current) use of insulin: Secondary | ICD-10-CM | POA: Diagnosis not present

## 2023-10-19 DIAGNOSIS — D649 Anemia, unspecified: Secondary | ICD-10-CM | POA: Diagnosis not present

## 2023-10-19 DIAGNOSIS — W19XXXA Unspecified fall, initial encounter: Secondary | ICD-10-CM | POA: Diagnosis present

## 2023-10-19 DIAGNOSIS — M47814 Spondylosis without myelopathy or radiculopathy, thoracic region: Secondary | ICD-10-CM | POA: Diagnosis not present

## 2023-10-19 DIAGNOSIS — Y92009 Unspecified place in unspecified non-institutional (private) residence as the place of occurrence of the external cause: Secondary | ICD-10-CM | POA: Diagnosis not present

## 2023-10-19 DIAGNOSIS — M19072 Primary osteoarthritis, left ankle and foot: Secondary | ICD-10-CM | POA: Diagnosis not present

## 2023-10-19 DIAGNOSIS — R55 Syncope and collapse: Secondary | ICD-10-CM | POA: Diagnosis not present

## 2023-10-19 DIAGNOSIS — E1143 Type 2 diabetes mellitus with diabetic autonomic (poly)neuropathy: Secondary | ICD-10-CM | POA: Diagnosis not present

## 2023-10-19 DIAGNOSIS — F419 Anxiety disorder, unspecified: Secondary | ICD-10-CM | POA: Diagnosis not present

## 2023-10-19 DIAGNOSIS — E1142 Type 2 diabetes mellitus with diabetic polyneuropathy: Secondary | ICD-10-CM | POA: Diagnosis not present

## 2023-10-19 DIAGNOSIS — F0393 Unspecified dementia, unspecified severity, with mood disturbance: Secondary | ICD-10-CM | POA: Diagnosis not present

## 2023-10-19 DIAGNOSIS — I951 Orthostatic hypotension: Secondary | ICD-10-CM | POA: Diagnosis not present

## 2023-10-19 DIAGNOSIS — E669 Obesity, unspecified: Secondary | ICD-10-CM | POA: Diagnosis not present

## 2023-10-19 DIAGNOSIS — M19011 Primary osteoarthritis, right shoulder: Secondary | ICD-10-CM | POA: Diagnosis not present

## 2023-10-19 DIAGNOSIS — S299XXA Unspecified injury of thorax, initial encounter: Secondary | ICD-10-CM | POA: Diagnosis not present

## 2023-10-19 DIAGNOSIS — M47816 Spondylosis without myelopathy or radiculopathy, lumbar region: Secondary | ICD-10-CM | POA: Diagnosis not present

## 2023-10-19 LAB — CBC
HCT: 39.2 % (ref 36.0–46.0)
Hemoglobin: 12.7 g/dL (ref 12.0–15.0)
MCH: 30.5 pg (ref 26.0–34.0)
MCHC: 32.4 g/dL (ref 30.0–36.0)
MCV: 94.2 fL (ref 80.0–100.0)
Platelets: 119 10*3/uL — ABNORMAL LOW (ref 150–400)
RBC: 4.16 MIL/uL (ref 3.87–5.11)
RDW: 13.2 % (ref 11.5–15.5)
WBC: 4.7 10*3/uL (ref 4.0–10.5)
nRBC: 0 % (ref 0.0–0.2)

## 2023-10-19 LAB — GLUCOSE, CAPILLARY
Glucose-Capillary: 255 mg/dL — ABNORMAL HIGH (ref 70–99)
Glucose-Capillary: 258 mg/dL — ABNORMAL HIGH (ref 70–99)
Glucose-Capillary: 302 mg/dL — ABNORMAL HIGH (ref 70–99)
Glucose-Capillary: 230 mg/dL — ABNORMAL HIGH (ref 70–99)

## 2023-10-19 LAB — BASIC METABOLIC PANEL WITH GFR
Anion gap: 9 (ref 5–15)
BUN: 28 mg/dL — ABNORMAL HIGH (ref 8–23)
CO2: 26 mmol/L (ref 22–32)
Calcium: 9.2 mg/dL (ref 8.9–10.3)
Chloride: 99 mmol/L (ref 98–111)
Creatinine, Ser: 1.37 mg/dL — ABNORMAL HIGH (ref 0.44–1.00)
GFR, Estimated: 40 mL/min — ABNORMAL LOW (ref >60)
Glucose, Bld: 331 mg/dL — ABNORMAL HIGH (ref 70–99)
Potassium: 3.8 mmol/L (ref 3.5–5.1)
Sodium: 134 mmol/L — ABNORMAL LOW (ref 135–145)

## 2023-10-19 LAB — TSH: TSH: 1.053 u[IU]/mL (ref 0.350–4.500)

## 2023-10-19 LAB — HEMOGLOBIN A1C
Hgb A1c MFr Bld: 7.9 % — ABNORMAL HIGH (ref 4.8–5.6)
Mean Plasma Glucose: 180.03 mg/dL

## 2023-10-19 LAB — VITAMIN D 25 HYDROXY (VIT D DEFICIENCY, FRACTURES): Vit D, 25-Hydroxy: 49.88 ng/mL (ref 30–100)

## 2023-10-19 MED ORDER — GUAIFENESIN 100 MG/5ML PO LIQD: 5.0000 mL | ORAL | Status: DC | PRN

## 2023-10-19 MED ORDER — SENNOSIDES-DOCUSATE SODIUM 8.6-50 MG PO TABS
1.0000 | ORAL_TABLET | Freq: Every evening | ORAL | Status: DC | PRN
  Filled 2023-10-19: qty 1

## 2023-10-19 MED ORDER — HYDRALAZINE HCL 20 MG/ML IJ SOLN: 10.0000 mg | INTRAMUSCULAR | Status: DC | PRN

## 2023-10-19 MED ORDER — CEPHALEXIN 500 MG PO CAPS
500.0000 mg | ORAL_CAPSULE | Freq: Three times a day (TID) | ORAL | Status: AC
  Administered 2023-10-19 – 2023-10-22 (×12): 500 mg via ORAL
  Filled 2023-10-19 (×12): qty 1

## 2023-10-19 NOTE — Plan of Care (Signed)
  Problem: Education: Goal: Knowledge of General Education information will improve Description: Including pain rating scale, medication(s)/side effects and non-pharmacologic comfort measures Outcome: Progressing   Problem: Health Behavior/Discharge Planning: Goal: Ability to manage health-related needs will improve Outcome: Progressing   Problem: Clinical Measurements: Goal: Ability to maintain clinical measurements within normal limits will improve Outcome: Progressing Goal: Will remain free from infection Outcome: Progressing Goal: Diagnostic test results will improve Outcome: Progressing Goal: Respiratory complications will improve Outcome: Progressing Goal: Cardiovascular complication will be avoided Outcome: Progressing   Problem: Activity: Goal: Risk for activity intolerance will decrease Outcome: Progressing   Problem: Nutrition: Goal: Adequate nutrition will be maintained Outcome: Progressing   Problem: Coping: Goal: Level of anxiety will decrease Outcome: Progressing   Problem: Elimination: Goal: Will not experience complications related to bowel motility Outcome: Progressing Goal: Will not experience complications related to urinary retention Outcome: Progressing   Problem: Pain Managment: Goal: General experience of comfort will improve and/or be controlled Outcome: Progressing   Problem: Safety: Goal: Ability to remain free from injury will improve Outcome: Progressing   Problem: Skin Integrity: Goal: Risk for impaired skin integrity will decrease Outcome: Progressing   Problem: Education: Goal: Knowledge of risk factors and measures for prevention of condition will improve Outcome: Progressing   Problem: Coping: Goal: Psychosocial and spiritual needs will be supported Outcome: Progressing   Problem: Respiratory: Goal: Will maintain a patent airway Outcome: Progressing Goal: Complications related to the disease process, condition or treatment  will be avoided or minimized Outcome: Progressing   Problem: Education: Goal: Ability to describe self-care measures that may prevent or decrease complications (Diabetes Survival Skills Education) will improve Outcome: Progressing Goal: Individualized Educational Video(s) Outcome: Progressing   Problem: Coping: Goal: Ability to adjust to condition or change in health will improve Outcome: Progressing   Problem: Fluid Volume: Goal: Ability to maintain a balanced intake and output will improve Outcome: Progressing   Problem: Health Behavior/Discharge Planning: Goal: Ability to identify and utilize available resources and services will improve Outcome: Progressing Goal: Ability to manage health-related needs will improve Outcome: Progressing   Problem: Metabolic: Goal: Ability to maintain appropriate glucose levels will improve Outcome: Progressing   Problem: Nutritional: Goal: Maintenance of adequate nutrition will improve Outcome: Progressing Goal: Progress toward achieving an optimal weight will improve Outcome: Progressing   Problem: Skin Integrity: Goal: Risk for impaired skin integrity will decrease Outcome: Progressing   Problem: Tissue Perfusion: Goal: Adequacy of tissue perfusion will improve Outcome: Progressing

## 2023-10-19 NOTE — Hospital Course (Addendum)
 Brief Narrative:  78 year old with history of insulin-dependent diabetes, autoimmune cirrhosis, CHF, depression, CKD stage II comes to the hospital with orthostasis, fall at home.  Patient is found to have COVID-19 infection and urinary tract infection.  Trauma workup does not show any acute pathology.  Upon admission patient was asymptomatic from COVID-19 infection standpoint therefore receiving supportive treatment.  For UTI placed on IV Rocephin.  PT/OT for weakness.  During the hospitalization Trauma workup including CT head, cervical, thoracic spine, CAP negative for acute pathology.  Bilateral shoulder x-rays, right foot and right knee, right hip x-ray are all negative for acute fracture or dislocation.  Vitamin D and TSH is normal.  Occasionally positive orthostatics.  I suspect she suffers quite a bit from autonomic dysfunction due to advanced age and diabetes.  She has been educated on how to prevent falls secondary to autonomic dysfunction.  Compression stocking was ordered.  Echocardiogram was unremarkable with preserved EF.  PT/OT recommended SNF.  TOC currently working on placement.   Assessment & Plan:  Principal Problem:   Dizziness Active Problems:   Near syncope   Fall - Trauma workup including CT head, cervical, thoracic spine, CAP negative for acute pathology.  Bilateral shoulder x-rays, right foot and right knee, right hip x-ray are all negative for acute fracture or dislocation.  Vitamin D and TSH is normal.  Occasionally positive orthostatics.  I suspect she suffers quite a bit from autonomic dysfunction due to advanced age and diabetes.  She has been educated on how to prevent falls secondary to autonomic dysfunction. - Compression stocking -PT/OT-SNF.  COVID-19 infection -No evidence of hypoxia.  Supportive care  Urinary tract infection -Urine cultures not sent, already on Rocephin > transition to Keflex.  EOT 4/2 -Previously has grown pansensitive Citrobacter and  Proteus.  Insulin-dependent diabetes mellitus -On Semglee, sliding scale and Accu-Chek.  Will further adjust as necessary -A1c 7.9 -On outpatient insulin pump  History of congestive heart failure, preserved EF - Currently on Lasix - Echocardiogram this admission shows preserved EF grade 1 DD  CKD stage II History of renal cell carcinoma status post partial nephrectomy January 2024 - Crea stable at 1.0  History of autoimmune cirrhosis -On daily prednisone  Depression - Cymbalta  GERD - PPI  Lung nodules - Follow-up outpatient pulmonary and oncology  PT/OT-SNF.  Patient is quite motivated to get better   DVT prophylaxis: heparin injection 5,000 Units Start: 10/18/23 0930    Code Status: Full Code Family Communication: [name] up to date. SNF placement.  Awaiting SNF placement   Subjective: Feels well no complaints.  Examination:  General exam: Appears calm and comfortable  Respiratory system: Clear to auscultation. Respiratory effort normal. Cardiovascular system: S1 & S2 heard, RRR. No JVD, murmurs, rubs, gallops or clicks. No pedal edema. Gastrointestinal system: Abdomen is nondistended, soft and nontender. No organomegaly or masses felt. Normal bowel sounds heard. Central nervous system: Alert and oriented. No focal neurological deficits. Extremities: Symmetric 5 x 5 power. Skin: No rashes, lesions or ulcers Psychiatry: Judgement and insight appear normal. Mood & affect appropriate.

## 2023-10-19 NOTE — Progress Notes (Signed)
 PROGRESS NOTE    [name] [name]  ZOX:[id number] DOB: [birth year] DOA: 10/17/2023 PCP: Excell Seltzer, MD    Brief Narrative:  78 year old with history of insulin-dependent diabetes, autoimmune cirrhosis, CHF, depression, CKD stage II comes to the hospital with orthostasis, fall at home.  Patient is found to have COVID-19 infection and urinary tract infection.  Trauma workup does not show any acute pathology.  Upon admission patient was asymptomatic from COVID-19 infection standpoint therefore receiving supportive treatment.  For UTI placed on IV Rocephin.  PT/OT for weakness.   Assessment & Plan:  Principal Problem:   Dizziness Active Problems:   Near syncope   Fall - Trauma workup including CT head, cervical, thoracic spine, CAP negative for acute pathology.  Bilateral shoulder x-rays, right foot and right knee, right hip x-ray are all negative for acute fracture or dislocation.  Check TSH, vitamin D, orthostatics -PT/OT  COVID-19 infection -No evidence of hypoxia.  Supportive care  Urinary tract infection -Urine cultures not sent, already on Rocephin > transition to Keflex.  EOT/2 -Previously has grown pansensitive Citrobacter and Proteus.  Insulin-dependent diabetes mellitus -On Semglee, sliding scale and Accu-Chek.  Will further adjust as necessary -A1c 7.9  History of congestive heart failure, preserved EF - Currently on Lasix - Echo December thousand 23 shows preserved EF  CKD stage II History of renal cell carcinoma status post partial nephrectomy January 2024 - Creatinine around baseline 1.3.  History of autoimmune cirrhosis -On daily prednisone  Depression - Cymbalta  GERD - PPI  Lung nodules - Follow-up outpatient pulmonary and oncology   DVT prophylaxis: heparin injection 5,000 Units Start: 10/18/23 0930    Code Status: Full Code Family Communication: Tells me she will update her family members Cont hospi stay for deconditining eval Hopefully  discharge tomorrow   Subjective:  Still having some cough this morning but lungs are overall clear Overall tells me she feels very deconditioned.  Examination:  General exam: Appears calm and comfortable  Respiratory system: Clear to auscultation. Respiratory effort normal. Cardiovascular system: S1 & S2 heard, RRR. No JVD, murmurs, rubs, gallops or clicks. No pedal edema. Gastrointestinal system: Abdomen is nondistended, soft and nontender. No organomegaly or masses felt. Normal bowel sounds heard. Central nervous system: Alert and oriented. No focal neurological deficits. Extremities: Symmetric 5 x 5 power. Skin: No rashes, lesions or ulcers Psychiatry: Judgement and insight appear normal. Mood & affect appropriate.                Diet Orders (From admission, onward)     Start     Ordered   10/18/23 0830  Diet Carb Modified Fluid consistency: Thin; Room service appropriate? Yes  Diet effective now       Question Answer Comment  Diet-HS Snack? Nothing   Calorie Level Medium 1600-2000   Fluid consistency: Thin   Room service appropriate? Yes      10/18/23 0830            Objective: Vitals:   10/18/23 1008 10/18/23 1857 10/18/23 2001 10/19/23 0503  BP: (!) 96/46 127/85  111/67  Pulse: 98 (!) 108  84  Resp: 18 18    Temp: 98.3 F (36.8 C) 98.9 F (37.2 C)    TempSrc: Oral Oral    SpO2: 99% 100% 99% 98%  Weight:      Height:        Intake/Output Summary (Last 24 hours) at 10/19/2023 1030 Last data filed at 10/18/2023 2100 Gross per 24 hour  Intake 100 ml  Output --  Net 100 ml   Filed Weights   10/18/23 0600  Weight: 85.1 kg    Scheduled Meds:  cephALEXin  500 mg Oral Q8H   diltiazem  15 mg Intravenous Once   DULoxetine  60 mg Oral Daily   furosemide  40 mg Oral Daily   heparin  5,000 Units Subcutaneous Q8H   insulin aspart  0-15 Units Subcutaneous TID WC   insulin aspart  0-5 Units Subcutaneous QHS   insulin glargine-yfgn  10 Units  Subcutaneous BID   loratadine  10 mg Oral Daily   pantoprazole  40 mg Oral Daily   predniSONE  10 mg Oral Q breakfast   Continuous Infusions:  diltiazem (CARDIZEM) infusion      Nutritional status     Body mass index is 33.23 kg/m.  Data Reviewed:   CBC: Recent Labs  Lab 10/18/23 0218 10/19/23 0403  WBC 7.3 4.7  NEUTROABS 5.4  --   HGB 13.5 12.7  HCT 40.5 39.2  MCV 90.2 94.2  PLT 140* 119*   Basic Metabolic Panel: Recent Labs  Lab 10/18/23 0218 10/18/23 0300 10/19/23 0403  NA 134*  --  134*  K 3.5  --  3.8  CL 98  --  99  CO2 22  --  26  GLUCOSE 151*  --  331*  BUN 27*  --  28*  CREATININE 1.23*  --  1.37*  CALCIUM 9.3  --  9.2  MG  --  2.1  --    GFR: Estimated Creatinine Clearance: 35.6 mL/min (A) (by C-G formula based on SCr of 1.37 mg/dL (H)). Liver Function Tests: Recent Labs  Lab 10/18/23 0218  AST 43*  ALT 43  ALKPHOS 128*  BILITOT 1.4*  PROT 6.4*  ALBUMIN 3.4*   No results for input(s): "LIPASE", "AMYLASE" in the last 168 hours. No results for input(s): "AMMONIA" in the last 168 hours. Coagulation Profile: No results for input(s): "INR", "PROTIME" in the last 168 hours. Cardiac Enzymes: No results for input(s): "CKTOTAL", "CKMB", "CKMBINDEX", "TROPONINI" in the last 168 hours. BNP (last 3 results) No results for input(s): "PROBNP" in the last 8760 hours. HbA1C: Recent Labs    10/19/23 0403  HGBA1C 7.9*   CBG: Recent Labs  Lab 10/18/23 1037 10/18/23 1155 10/18/23 1619 10/18/23 2109 10/19/23 0747  GLUCAP 206* 247* 294* 238* 255*   Lipid Profile: No results for input(s): "CHOL", "HDL", "LDLCALC", "TRIG", "CHOLHDL", "LDLDIRECT" in the last 72 hours. Thyroid Function Tests: No results for input(s): "TSH", "T4TOTAL", "FREET4", "T3FREE", "THYROIDAB" in the last 72 hours. Anemia Panel: No results for input(s): "VITAMINB12", "FOLATE", "FERRITIN", "TIBC", "IRON", "RETICCTPCT" in the last 72 hours. Sepsis Labs: No results for  input(s): "PROCALCITON", "LATICACIDVEN" in the last 168 hours.  Recent Results (from the past 240 hours)  SARS Coronavirus 2 by RT PCR (hospital order, performed in [facility] hospital lab) *cepheid single result test* Anterior Nasal Swab     Status: Abnormal   Collection Time: 10/18/23  6:12 AM   Specimen: Anterior Nasal Swab  Result Value Ref Range Status   SARS Coronavirus 2 by RT PCR POSITIVE (A) NEGATIVE Final    Comment: (NOTE) SARS-CoV-2 target nucleic acids are DETECTED  SARS-CoV-2 RNA is generally detectable in upper respiratory specimens  during the acute phase of infection.  Positive results are indicative  of the presence of the identified virus, but do not rule out bacterial infection or co-infection with other pathogens not  detected by the test.  Clinical correlation with patient history and  other diagnostic information is necessary to determine patient infection status.  The expected result is negative.  Fact Sheet for Patients:   RoadLapTop.co.za   Fact Sheet for Healthcare Providers:   http://kim-miller.com/    This test is not yet approved or cleared by the [place] FDA and  has been authorized for detection and/or diagnosis of SARS-CoV-2 by FDA under an Emergency Use Authorization (EUA).  This EUA will remain in effect (meaning this test can be used) for the duration of  the COVID-19 declaration under Section 564(b)(1)  of the Act, 21 U.S.C. section 360-bbb-3(b)(1), unless the authorization is terminated or revoked sooner.   Performed at [facility], [address], [place], [place] [address]          Radiology Studies: DG Foot Complete Right Result Date: 10/18/2023 CLINICAL DATA:  Fall EXAM: RIGHT FOOT COMPLETE - 3+ VIEW COMPARISON:  Right foot MRI 12/24/2014 FINDINGS: Screw fixation of the calcaneus. No acute fracture or dislocation. Soft tissues are unremarkable. IMPRESSION: No  acute fracture or dislocation. Electronically Signed   By: Minerva Fester M.D.   On: 10/18/2023 02:18   DG Knee Complete 4 Views Right Result Date: 10/18/2023 CLINICAL DATA:  Fall EXAM: RIGHT KNEE - COMPLETE 4+ VIEW COMPARISON:  None Available. FINDINGS: No acute fracture or dislocation. No knee joint effusion. Vascular calcifications. IMPRESSION: Negative. Electronically Signed   By: Minerva Fester M.D.   On: 10/18/2023 02:16   DG HIP UNILAT WITH PELVIS 2-3 VIEWS RIGHT Result Date: 10/18/2023 CLINICAL DATA:  Fall EXAM: DG HIP (WITH OR WITHOUT PELVIS) 2-3V RIGHT COMPARISON:  Radiographs heel in [id number] FINDINGS: No acute fracture or dislocation. Degenerative changes pubic symphysis, both hips, SI joints and lower lumbar spine. IMPRESSION: No acute fracture or dislocation. Electronically Signed   By: Minerva Fester M.D.   On: 10/18/2023 02:14   DG Shoulder Right Result Date: 10/18/2023 CLINICAL DATA:  Fall EXAM: RIGHT SHOULDER - 2+ VIEW; LEFT SHOULDER - 2+ VIEW COMPARISON:  None. FINDINGS: No acute fracture or dislocation of the bilateral shoulders. Degenerative arthritis both AC joints. Chronic changes about the distal right clavicle. Linear calcification superior to the right humeral head likely due to calcific tendinopathy. IMPRESSION: No acute fracture or dislocation. Electronically Signed   By: Minerva Fester M.D.   On: 10/18/2023 02:13   DG Shoulder Left Result Date: 10/18/2023 CLINICAL DATA:  Fall EXAM: RIGHT SHOULDER - 2+ VIEW; LEFT SHOULDER - 2+ VIEW COMPARISON:  None. FINDINGS: No acute fracture or dislocation of the bilateral shoulders. Degenerative arthritis both AC joints. Chronic changes about the distal right clavicle. Linear calcification superior to the right humeral head likely due to calcific tendinopathy. IMPRESSION: No acute fracture or dislocation. Electronically Signed   By: Minerva Fester M.D.   On: 10/18/2023 02:13   CT CHEST ABDOMEN PELVIS WO CONTRAST Result Date:  10/18/2023 CLINICAL DATA:  Polytrauma, blunt.  Fall EXAM: CT CHEST, ABDOMEN AND PELVIS WITHOUT CONTRAST TECHNIQUE: Multidetector CT imaging of the chest, abdomen and pelvis was performed following the standard protocol without IV contrast. RADIATION DOSE REDUCTION: This exam was performed according to the departmental dose-optimization program which includes automated exposure control, adjustment of the mA and/or kV according to patient size and/or use of iterative reconstruction technique. COMPARISON:  06/30/2023 FINDINGS: CT CHEST FINDINGS Cardiovascular: No significant vascular findings. Normal heart size. No pericardial effusion. Mild atherosclerotic calcification within the thoracic aorta. Mediastinum/Nodes: No  enlarged mediastinal, hilar, or axillary lymph nodes. Thyroid gland, trachea, and esophagus demonstrate no significant findings. Lungs/Pleura: Branching nodular density is again seen within the medial right lower lobe at axial image # 118/7 and sagittal image # 89/11 compatible with a impacted terminal. Associated bronchial wall thickening is present keeping with airway inflammation. There is stable mild bibasilar cylindrical bronchiectasis. Additional mucous plugging lateral segment of the left is also unchanged. Stable 4 mm subpleural pulmonary nodule within the left lower lobe, axial image # 63/7. Stable minimal parenchymal subpleural scarring within the right lung base. No focal pulmonary infiltrate. No pneumothorax or pleural effusion. No central obstructing lesion. Musculoskeletal: No acute bone abnormality. No lytic or blastic bone lesion. CT ABDOMEN PELVIS FINDINGS Hepatobiliary: Status post cholecystectomy. No intra or extrahepatic biliary ductal dilation. Nodular contour of the liver with relative hypertrophy of the left hepatic lobe noted in keeping with changes of cirrhosis. Pancreas: Multiple stable cysts are seen within the pancreatic body and tail measuring up to 13 mm best seen on image #  64/3, stable since remote PET CT 09/26/2022 four views are not metabolically active. The pancreas is otherwise unremarkable. Spleen: Unremarkable Adrenals/Urinary Tract: Status post left radical nephrectomy. Residual right adrenal gland and right kidney unremarkable. Bladder is unremarkable. Stomach/Bowel: Moderate pancolonic diverticulosis. Stomach, small bowel, and large bowel are otherwise unremarkable. Appendix absent. No evidence of obstruction or focal inflammation. No free intraperitoneal gas or fluid. Vascular/Lymphatic: Aortic atherosclerosis. No enlarged abdominal or pelvic lymph nodes. Reproductive: Status post hysterectomy. No adnexal masses. Other: No abdominal wall hernia or abnormality. No abdominopelvic ascites. Musculoskeletal: Osseous structures are age-appropriate. No acute bone abnormality. No lytic or blastic bone lesion. IMPRESSION: 1. No acute intrathoracic or intra-abdominal injury. 2. Stable branching nodular density within the lower lobes bilaterally compatible with impacted terminal bronchi. Associated bronchial wall thickening is present keeping with airway inflammation. 3. Stable 4 mm subpleural pulmonary nodule within the left lower lobe. 4. Moderate pancolonic diverticulosis without superimposed acute inflammatory change. 5. Multiple stable cysts within the pancreatic body and tail measuring up to 13 mm, stable since remote PET CT 09/26/2022 and not metabolically active. Attention on follow-up imaging is recommended. 6. Status post left radical nephrectomy. 7. Nodular contour of the liver with relative hypertrophy of the left hepatic lobe noted in keeping with changes of cirrhosis. Aortic Atherosclerosis (ICD10-I70.0). Electronically Signed   By: Helyn Numbers M.D.   On: 10/18/2023 02:09   CT T-SPINE NO CHARGE Result Date: 10/18/2023 CLINICAL DATA:  Fall, blunt back trauma EXAM: CT THORACIC SPINE WITHOUT CONTRAST TECHNIQUE: Multidetector CT images of the thoracic were obtained  using the standard protocol without intravenous contrast. RADIATION DOSE REDUCTION: This exam was performed according to the departmental dose-optimization program which includes automated exposure control, adjustment of the mA and/or kV according to patient size and/or use of iterative reconstruction technique. COMPARISON:  None Available. FINDINGS: Alignment: Normal. Vertebrae: No acute fracture or focal pathologic process. Bridging disc osteophytes are seen throughout the mid and lower lumbar spine in keeping with changes of diffuse idiopathic skeletal hyperostosis. Paraspinal and other soft tissues: Negative. Disc levels: Intervertebral disc heights are preserved. No significant canal stenosis. 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 IMPRESSION: 1. No acute fracture or listhesis of the thoracic spine. 2. 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 Electronically Signed   By: Helyn Numbers M.D.   On: 10/18/2023 01:54   CT Head Wo Contrast Result Date: 10/18/2023 CLINICAL  DATA:  Head and neck trauma.  Fall. EXAM: CT HEAD WITHOUT CONTRAST CT CERVICAL SPINE WITHOUT CONTRAST TECHNIQUE: Multidetector CT imaging of the head and cervical spine was performed following the standard protocol without intravenous contrast. Multiplanar CT image reconstructions of the cervical spine were also generated. RADIATION DOSE REDUCTION: This exam was performed according to the departmental dose-optimization program which includes automated exposure control, adjustment of the mA and/or kV according to patient size and/or use of iterative reconstruction technique. COMPARISON:  MRI head 08/10/2004 and MR cervical spine 12/28/2004 FINDINGS: CT HEAD FINDINGS Brain: No intracranial hemorrhage, mass effect, or evidence of acute infarct. No hydrocephalus. No extra-axial fluid collection. Age related cerebral atrophy and chronic small vessel ischemic  disease. Vascular: No hyperdense vessel. Intracranial arterial calcification. Skull: No fracture or focal lesion. Sinuses/Orbits: No acute finding. Other: None. CT CERVICAL SPINE FINDINGS Alignment: No evidence of traumatic malalignment. Skull base and vertebrae: No acute fracture. No primary bone lesion or focal pathologic process. Soft tissues and spinal canal: No prevertebral fluid or swelling. No visible canal hematoma. Disc levels: Multilevel spondylosis and facet arthropathy. No severe spinal canal narrowing. Upper chest: No acute abnormality. Other: Carotid calcification. IMPRESSION: 1. No acute intracranial abnormality. 2. No acute fracture in the cervical spine. Electronically Signed   By: Minerva Fester M.D.   On: 10/18/2023 01:44   CT Cervical Spine Wo Contrast Result Date: 10/18/2023 CLINICAL DATA:  Head and neck trauma.  Fall. EXAM: CT HEAD WITHOUT CONTRAST CT CERVICAL SPINE WITHOUT CONTRAST TECHNIQUE: Multidetector CT imaging of the head and cervical spine was performed following the standard protocol without intravenous contrast. Multiplanar CT image reconstructions of the cervical spine were also generated. RADIATION DOSE REDUCTION: This exam was performed according to the departmental dose-optimization program which includes automated exposure control, adjustment of the mA and/or kV according to patient size and/or use of iterative reconstruction technique. COMPARISON:  MRI head 08/10/2004 and MR cervical spine 12/28/2004 FINDINGS: CT HEAD FINDINGS Brain: No intracranial hemorrhage, mass effect, or evidence of acute infarct. No hydrocephalus. No extra-axial fluid collection. Age related cerebral atrophy and chronic small vessel ischemic disease. Vascular: No hyperdense vessel. Intracranial arterial calcification. Skull: No fracture or focal lesion. Sinuses/Orbits: No acute finding. Other: None. CT CERVICAL SPINE FINDINGS Alignment: No evidence of traumatic malalignment. Skull base and vertebrae:  No acute fracture. No primary bone lesion or focal pathologic process. Soft tissues and spinal canal: No prevertebral fluid or swelling. No visible canal hematoma. Disc levels: Multilevel spondylosis and facet arthropathy. No severe spinal canal narrowing. Upper chest: No acute abnormality. Other: Carotid calcification. IMPRESSION: 1. No acute intracranial abnormality. 2. No acute fracture in the cervical spine. Electronically Signed   By: Minerva Fester M.D.   On: 10/18/2023 01:44           LOS: 0 days   Time spent= 35 mins    Miguel Rota, MD [facility]  If 7PM-7AM, please contact night-coverage  10/19/2023, 10:30 AM

## 2023-10-19 NOTE — Progress Notes (Signed)
 PT Cancellation Note  Patient Details Name: [name] [name] MRN: [id number] DOB: [birth year]   Cancelled Treatment:    Reason Eval/Treat Not Completed: Other (comment) Per OT, pt orthostatic at this time.   Will check back as schedule permits.   Janan Halter [place] 10/19/2023, 12:16 PM Paulino Door, DPT Physical Therapist Acute Rehabilitation Services Office: [phone]

## 2023-10-19 NOTE — Evaluation (Signed)
 Occupational Therapy Evaluation Patient Details Name: [name] [name] MRN: [id number] DOB: 1945-11-16 Today's Date: 10/19/2023   History of Present Illness   Patient is a 78 year old female who presented on 3/28 after fall at home. Patient was admitted with COVID 19, and UTI. X-rays were negative for fractures. PMH: CHF, depression, GERD, lung nodules, insulin dependent DM.     Clinical Impressions Patient is a 78 year old female who was admitted for above. Patient was living at home alone with large dog per patient report. Currently, patient is limited with orthostatic vitals as noted below. Patient noted to have tremor like body movements with increased time standing with RW. Unable to get three minute standing vital at this time. Patient was noted to have decreased functional activity tolerance, decreased endurance, decreased standing balance, decreased safety awareness, and decreased knowledge of AD/AE impacting participation in ADLs. Patient will benefit from continued inpatient follow up therapy, <3 hours/day.   Blood pressures: Supine: 93/47 mmhg HR 80 bpm  Sitting: 103/71 mmhg, HR 101 bpm  Standing 76/53 mmhg HR 106 bpm     If plan is discharge home, recommend the following:   A lot of help with bathing/dressing/bathroom;Assistance with cooking/housework;Direct supervision/assist for medications management;Assist for transportation;Help with stairs or ramp for entrance;Direct supervision/assist for financial management;A lot of help with walking and/or transfers;Supervision due to cognitive status     Functional Status Assessment   Patient has had a recent decline in their functional status and demonstrates the ability to make significant improvements in function in a reasonable and predictable amount of time.     Equipment Recommendations   None recommended by OT      Precautions/Restrictions   Precautions Precaution/Restrictions Comments:  orthostatic Restrictions Weight Bearing Restrictions Per Provider Order: No     Mobility Bed Mobility Overal bed mobility: Needs Assistance Bed Mobility: Supine to Sit     Supine to sit: Min assist, HOB elevated     General bed mobility comments: increased time          Balance Overall balance assessment: Needs assistance Sitting-balance support: Feet supported Sitting balance-Leahy Scale: Fair     Standing balance support: Reliant on assistive device for balance, Bilateral upper extremity supported Standing balance-Leahy Scale: Poor       ADL either performed or assessed with clinical judgement   ADL Overall ADL's : Needs assistance/impaired Eating/Feeding: Supervision/ safety;Sitting   Grooming: Sitting;Set up   Upper Body Bathing: Sitting;Minimal assistance Upper Body Bathing Details (indicate cue type and reason): pain in R shoulder with movement for simulated tasks. Lower Body Bathing: Sitting/lateral leans;Maximal assistance   Upper Body Dressing : Sitting;Minimal assistance   Lower Body Dressing: Sitting/lateral leans;Maximal assistance Lower Body Dressing Details (indicate cue type and reason): unsteady wtih need for BUE support in standing Toilet Transfer: Minimal assistance;Ambulation;Rolling walker (2 wheels) Toilet Transfer Details (indicate cue type and reason): taking side steps on HOB with BP noted to be low during standing trials. patient denying symptoms but noted to have slight body tremor with increased time standing. patient returned to bed with nurse in room. Toileting- Clothing Manipulation and Hygiene: Sitting/lateral lean;Minimal assistance               Vision   Vision Assessment?: No apparent visual deficits            Pertinent Vitals/Pain Pain Assessment Pain Assessment: Faces Faces Pain Scale: Hurts little more Pain Location: R scapular area with movement. Pain Descriptors / Indicators: Grimacing Pain Intervention(s):  Monitored during session     Extremity/Trunk Assessment Upper Extremity Assessment Upper Extremity Assessment: Right hand dominant;RUE deficits/detail RUE Deficits / Details: ROM over 90 degrees with FF and to 90 with abduction. reported pain in scapular area with ER and FF. patient reported having fallen on this area prior to arrival. RUE: Unable to fully assess due to pain;Shoulder pain with ROM RUE Coordination: decreased gross motor   Lower Extremity Assessment Lower Extremity Assessment: Defer to PT evaluation   Cervical / Trunk Assessment Cervical / Trunk Assessment: Normal   Communication Communication Communication: No apparent difficulties   Cognition Arousal: Alert Behavior During Therapy: Flat affect Cognition: Cognition impaired     Awareness: Intellectual awareness impaired, Online awareness impaired   Attention impairment (select first level of impairment): Selective attention Executive functioning impairment (select all impairments): Reasoning, Sequencing, Problem solving OT - Cognition Comments: patient reported feeling confused for months now but reported that she was still fine to live at home alone with just her dog for support.                 Following commands: Intact                  Home Living Family/patient expects to be discharged to:: Private residence Living Arrangements: Alone   Type of Home: House Home Access: Ramped entrance     Home Layout: One level     Bathroom Shower/Tub: Tub/shower unit;Walk-in shower   Bathroom Toilet: Handicapped height     Home Equipment: Grab bars - tub/shower;Grab bars - toilet;Rollator (4 wheels);Wheelchair - manual;Cane - single point          Prior Functioning/Environment Prior Level of Function : Needs assist               ADLs Comments: patient reported living at home alone with independence in ADls. patient reported children were currently at the house caring for her dog.     OT Problem List: Decreased strength;Impaired balance (sitting and/or standing);Decreased knowledge of precautions;Pain;Decreased range of motion;Decreased activity tolerance;Decreased knowledge of use of DME or AE;Impaired sensation;Impaired UE functional use;Cardiopulmonary status limiting activity   OT Treatment/Interventions: Self-care/ADL training;DME and/or AE instruction;Therapeutic activities;Balance training;Therapeutic exercise;Energy conservation;Patient/family education      OT Goals(Current goals can be found in the care plan section)   Acute Rehab OT Goals Patient Stated Goal: to go back home to dog. OT Goal Formulation: With patient Time For Goal Achievement: 11/02/23 Potential to Achieve Goals: Fair   OT Frequency:  Min 2X/week       AM-PAC OT "6 Clicks" Daily Activity     Outcome Measure Help from another person eating meals?: None Help from another person taking care of personal grooming?: A Little Help from another person toileting, which includes using toliet, bedpan, or urinal?: A Lot Help from another person bathing (including washing, rinsing, drying)?: A Lot Help from another person to put on and taking off regular upper body clothing?: A Lot Help from another person to put on and taking off regular lower body clothing?: A Lot 6 Click Score: 15   End of Session Equipment Utilized During Treatment: Rolling walker (2 wheels);Oxygen Nurse Communication: Other (comment) (nurse in room at end of session)  Activity Tolerance: (S) Patient limited by fatigue;Other (comment) (orthostatic) Patient left: in bed;with call bell/phone within reach;Other (comment) (sitting EOB with nurse in room.)  OT Visit Diagnosis: Unsteadiness on feet (R26.81);Other abnormalities of gait and mobility (R26.89);Pain Pain - Right/Left: Right Pain -  part of body: Shoulder                Time: 1203-1217 OT Time Calculation (min): 14 min Charges:  OT General Charges $OT Visit: 1  Visit OT Evaluation $OT Eval Moderate Complexity: 1 Mod  Faatimah Spielberg OTR/L, MS Acute Rehabilitation Department Office# [phone]   Selinda Flavin 10/19/2023, 12:45 PM

## 2023-10-20 ENCOUNTER — Inpatient Hospital Stay (HOSPITAL_COMMUNITY)

## 2023-10-20 DIAGNOSIS — I428 Other cardiomyopathies: Secondary | ICD-10-CM | POA: Diagnosis not present

## 2023-10-20 DIAGNOSIS — R42 Dizziness and giddiness: Secondary | ICD-10-CM | POA: Diagnosis not present

## 2023-10-20 LAB — CBC
HCT: 34.7 % — ABNORMAL LOW (ref 36.0–46.0)
Hemoglobin: 11.4 g/dL — ABNORMAL LOW (ref 12.0–15.0)
MCH: 30.2 pg (ref 26.0–34.0)
MCHC: 32.9 g/dL (ref 30.0–36.0)
MCV: 91.8 fL (ref 80.0–100.0)
Platelets: 107 10*3/uL — ABNORMAL LOW (ref 150–400)
RBC: 3.78 MIL/uL — ABNORMAL LOW (ref 3.87–5.11)
RDW: 13.2 % (ref 11.5–15.5)
WBC: 4 10*3/uL (ref 4.0–10.5)
nRBC: 0 % (ref 0.0–0.2)

## 2023-10-20 LAB — GLUCOSE, CAPILLARY
Glucose-Capillary: 154 mg/dL — ABNORMAL HIGH (ref 70–99)
Glucose-Capillary: 199 mg/dL — ABNORMAL HIGH (ref 70–99)
Glucose-Capillary: 190 mg/dL — ABNORMAL HIGH (ref 70–99)
Glucose-Capillary: 363 mg/dL — ABNORMAL HIGH (ref 70–99)

## 2023-10-20 LAB — BASIC METABOLIC PANEL WITH GFR
Anion gap: 8 (ref 5–15)
BUN: 29 mg/dL — ABNORMAL HIGH (ref 8–23)
CO2: 25 mmol/L (ref 22–32)
Calcium: 9 mg/dL (ref 8.9–10.3)
Chloride: 100 mmol/L (ref 98–111)
Creatinine, Ser: 1.08 mg/dL — ABNORMAL HIGH (ref 0.44–1.00)
GFR, Estimated: 53 mL/min — ABNORMAL LOW (ref >60)
Glucose, Bld: 248 mg/dL — ABNORMAL HIGH (ref 70–99)
Potassium: 3.7 mmol/L (ref 3.5–5.1)
Sodium: 133 mmol/L — ABNORMAL LOW (ref 135–145)

## 2023-10-20 LAB — ECHOCARDIOGRAM COMPLETE
Area-P 1/2: 2.84 cm2
Calc EF: 68.1 %
Height: 63 in
MV VTI: 2.49 cm2
S' Lateral: 2.6 cm
Single Plane A2C EF: 68.1 %
Single Plane A4C EF: 66.6 %
Weight: 3001.78 [oz_av]

## 2023-10-20 LAB — PHOSPHORUS: Phosphorus: 3.1 mg/dL (ref 2.5–4.6)

## 2023-10-20 LAB — MAGNESIUM: Magnesium: 1.7 mg/dL (ref 1.7–2.4)

## 2023-10-20 MED ORDER — INSULIN ASPART 100 UNIT/ML IJ SOLN
2.0000 [IU] | Freq: Three times a day (TID) | INTRAMUSCULAR | Status: DC
  Administered 2023-10-20 – 2023-10-24 (×14): 2 [IU] via SUBCUTANEOUS

## 2023-10-20 MED ORDER — POLYETHYLENE GLYCOL 3350 17 G PO PACK: 17.0000 g | PACK | Freq: Every day | ORAL | Status: DC | PRN

## 2023-10-20 MED ORDER — SODIUM CHLORIDE 0.9 % IV BOLUS
500.0000 mL | Freq: Once | INTRAVENOUS | Status: AC
  Administered 2023-10-20: 500 mL via INTRAVENOUS

## 2023-10-20 MED ORDER — INSULIN GLARGINE-YFGN 100 UNIT/ML ~~LOC~~ SOLN
14.0000 [IU] | Freq: Two times a day (BID) | SUBCUTANEOUS | Status: DC
  Administered 2023-10-20 – 2023-10-24 (×9): 14 [IU] via SUBCUTANEOUS
  Filled 2023-10-20 (×11): qty 0.14

## 2023-10-20 NOTE — Evaluation (Signed)
 Physical Therapy Evaluation Patient Details Name: [name] [name] MRN: [id number] DOB: 1946/05/18 Today's Date: 10/20/2023  History of Present Illness  Patient is a 78 year old female who presented on 3/28 after fall at home. Patient was admitted with COVID 19, and UTI. X-rays were negative for fractures. PMH: CHF, depression, GERD, lung nodules, insulin dependent DM.  Clinical Impression  On eval, pt was Min A for mobility. She walked ~75 feet with a RW. Pt was not orthostatic during PT session and pt denied dizziness. O2 97% on RA with pt reporting some dyspnea with ambulation. Pt presents with general weakness, decreased activity tolerance, and impaired gait and balance. She remains at risk for falls. Pt lives alone with her large dog. At this time, feel pt will benefit from continued inpatient follow up therapy, <3 hours/day. If pt continues to progress well, and can consistently mobilize with staff without experiencing orthostatic hypotension, she could potentially return home with HHPT.          If plan is discharge home, recommend the following: A little help with walking and/or transfers;A little help with bathing/dressing/bathroom;Assistance with cooking/housework;Assist for transportation;Help with stairs or ramp for entrance   Can travel by private vehicle        Equipment Recommendations None recommended by PT  Recommendations for Other Services       Functional Status Assessment       Precautions / Restrictions Precautions Precautions: Fall Restrictions Weight Bearing Restrictions Per Provider Order: No      Mobility  Bed Mobility Overal bed mobility: Needs Assistance Bed Mobility: Supine to Sit, Sit to Supine     Supine to sit: Supervision, HOB elevated Sit to supine: Supervision, HOB elevated        Transfers Overall transfer level: Needs assistance Equipment used: Rolling walker (2 wheels) Transfers: Sit to/from Stand Sit to Stand: Min assist            General transfer comment: Cues for safety, hand placement Increased time. Pt denied dizziness.    Ambulation/Gait Ambulation/Gait assistance: Min assist Gait Distance (Feet): 75 Feet Assistive device: Rolling walker (2 wheels) Gait Pattern/deviations: Step-through pattern, Decreased stride length       General Gait Details: Slow gati speed. Unsteady-intermittent assist to steady. Tolerated distance well.  Stairs            Wheelchair Mobility     Tilt Bed    Modified Rankin (Stroke Patients Only)       Balance Overall balance assessment: Needs assistance, History of Falls         Standing balance support: Reliant on assistive device for balance, Bilateral upper extremity supported Standing balance-Leahy Scale: Poor                               Pertinent Vitals/Pain Pain Assessment Pain Assessment: Faces Faces Pain Scale: Hurts little more Pain Location: low back, buttocks, genital area (from fall at home) Pain Descriptors / Indicators: Discomfort Pain Intervention(s): Limited activity within patient's tolerance, Monitored during session, Repositioned    Home Living                          Prior Function                       Extremity/Trunk Assessment  Communication   Communication Communication: No apparent difficulties    Cognition Arousal: Alert Behavior During Therapy: WFL for tasks assessed/performed   PT - Cognitive impairments: No apparent impairments                         Following commands: Intact       Cueing Cueing Techniques: Verbal cues     General Comments      Exercises     Assessment/Plan    PT Assessment    PT Problem List         PT Treatment Interventions      PT Goals (Current goals can be found in the Care Plan section)  Acute Rehab PT Goals Patient Stated Goal: get better PT Goal Formulation: With patient Time For Goal Achievement:  11/03/23 Potential to Achieve Goals: Good    Frequency Min 2X/week     Co-evaluation               AM-PAC PT "6 Clicks" Mobility  Outcome Measure Help needed turning from your back to your side while in a flat bed without using bedrails?: A Little Help needed moving from lying on your back to sitting on the side of a flat bed without using bedrails?: A Little Help needed moving to and from a bed to a chair (including a wheelchair)?: A Little Help needed standing up from a chair using your arms (e.g., wheelchair or bedside chair)?: A Little Help needed to walk in hospital room?: A Little Help needed climbing 3-5 steps with a railing? : A Lot 6 Click Score: 17    End of Session Equipment Utilized During Treatment: Gait belt Activity Tolerance: Patient tolerated treatment well Patient left: in bed;with call bell/phone within reach;with bed alarm set   PT Visit Diagnosis: History of falling (Z91.81);Repeated falls (R29.6);Muscle weakness (generalized) (M62.81);Difficulty in walking, not elsewhere classified (R26.2)    Time: 1136-1207 PT Time Calculation (min) (ACUTE ONLY): 31 min   Charges:   PT Evaluation $PT Eval Low Complexity: 1 Low PT Treatments $Gait Training: 8-22 mins PT General Charges $$ ACUTE PT VISIT: 1 Visit           Faye Ramsay, PT Acute Rehabilitation  Office: [phone]

## 2023-10-20 NOTE — Inpatient Diabetes Management (Signed)
 Inpatient Diabetes Program Recommendations  AACE/ADA: New Consensus Statement on Inpatient Glycemic Control (2015)  Target Ranges:  Prepandial:   less than 140 mg/dL      Peak postprandial:   less than 180 mg/dL (1-2 hours)      Critically ill patients:  140 - 180 mg/dL   Lab Results  Component Value Date   GLUCAP 199 (H) 10/20/2023   HGBA1C 7.9 (H) 10/19/2023    Review of Glycemic Control  Latest Reference Range & Units 10/20/23 09:01 10/20/23 12:26  Glucose-Capillary 70 - 99 mg/dL 782 (H) 956 (H)  (H): Data is abnormally high  Diabetes history: DM Outpatient Diabetes medications: Medtronic 680G insulin pump with the Guardian CGM-Humalog Current orders for Inpatient glycemic control: Semglee 14 units BID, Novolog 0-15 units TID and 0-5 units at bedtime, Novolog 2 units TID, Prednisone 10 mg QD  Met with patient at bedside.  She is current with endocrinologist, Dr. Talmage Nap.  Dr. Talmage Nap made insulin pump adjustments at her last visit; not noted in her note.  Patient's pump is at home so unable to review pump settings.    Will continue to follow while inpatient.  Thank you, Dulce Sellar, MSN, CDCES Diabetes Coordinator Inpatient Diabetes Program [phone] (team pager from 8a-5p)

## 2023-10-20 NOTE — Progress Notes (Signed)
 PROGRESS NOTE    [name] [name]  GEX:[id number] DOB: Apr 12, 1946 DOA: 10/17/2023 PCP: Excell Seltzer, MD    Brief Narrative:  78 year old with history of insulin-dependent diabetes, autoimmune cirrhosis, CHF, depression, CKD stage II comes to the hospital with orthostasis, fall at home.  Patient is found to have COVID-19 infection and urinary tract infection.  Trauma workup does not show any acute pathology.  Upon admission patient was asymptomatic from COVID-19 infection standpoint therefore receiving supportive treatment.  For UTI placed on IV Rocephin.  PT/OT for weakness.   Assessment & Plan:  Principal Problem:   Dizziness Active Problems:   Near syncope   Fall - Trauma workup including CT head, cervical, thoracic spine, CAP negative for acute pathology.  Bilateral shoulder x-rays, right foot and right knee, right hip x-ray are all negative for acute fracture or dislocation.  Check TSH, vitamin D, orthostatics -PT/OT  COVID-19 infection -No evidence of hypoxia.  Supportive care  Urinary tract infection -Urine cultures not sent, already on Rocephin > transition to Keflex.  EOT/2 -Previously has grown pansensitive Citrobacter and Proteus.  Insulin-dependent diabetes mellitus -On Semglee, sliding scale and Accu-Chek.  Will further adjust as necessary -A1c 7.9  History of congestive heart failure, preserved EF - Currently on Lasix - Echo December 2023 shows preserved EF  CKD stage II History of renal cell carcinoma status post partial nephrectomy January 2024 - Creatinine around baseline 1.3.  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 SNF placement.    Subjective:  Tells me coughing and straining leads to blurry  vision and possible passing out.   Examination:  General exam: Appears calm and comfortable  Respiratory system: Clear to auscultation. Respiratory effort normal. Cardiovascular system: S1 & S2 heard, RRR. No JVD, murmurs, rubs, gallops or clicks. No pedal edema. Gastrointestinal system: Abdomen is nondistended, soft and nontender. No organomegaly or masses felt. Normal bowel sounds heard. Central nervous system: Alert and oriented. No focal neurological deficits. Extremities: Symmetric 5 x 5 power. Skin: No rashes, lesions or ulcers Psychiatry: Judgement and insight appear normal. Mood & affect appropriate.                Diet Orders (From admission, onward)     Start     Ordered   10/18/23 0830  Diet Carb Modified Fluid consistency: Thin; Room service appropriate? Yes  Diet effective now       Question Answer Comment  Diet-HS Snack? Nothing   Calorie Level Medium 1600-2000   Fluid consistency: Thin   Room service appropriate? Yes      10/18/23 0830            Objective: Vitals:   10/19/23 1151 10/19/23 2124 10/20/23 0351 10/20/23 1041  BP: 119/67 129/74 130/81 (!) 118/58  Pulse: (!) 105 96 87 84  Resp: 18 20 16 18   Temp: 98.3 F (36.8 C) 97.7 F (36.5 C) 98.1 F (36.7 C) 98.2 F (36.8 C)  TempSrc: Oral Oral Oral Oral  SpO2: 99% 99% 100% 100%  Weight:      Height:        Intake/Output Summary (Last 24 hours) at 10/20/2023 1217 Last data filed at 10/20/2023 0600 Gross per 24 hour  Intake 50 ml  Output --  Net  50 ml   Filed Weights   10/18/23 0600  Weight: 85.1 kg    Scheduled Meds:  cephALEXin  500 mg Oral Q8H   diltiazem  15 mg Intravenous Once   DULoxetine  60 mg Oral Daily   heparin  5,000 Units Subcutaneous Q8H   insulin aspart  0-15 Units Subcutaneous TID WC   insulin aspart  0-5 Units Subcutaneous QHS   insulin aspart  2 Units Subcutaneous TID WC   insulin glargine-yfgn  14 Units Subcutaneous BID   loratadine  10 mg Oral Daily    pantoprazole  40 mg Oral Daily   predniSONE  10 mg Oral Q breakfast   Continuous Infusions:  diltiazem (CARDIZEM) infusion      Nutritional status     Body mass index is 33.23 kg/m.  Data Reviewed:   CBC: Recent Labs  Lab 10/18/23 0218 10/19/23 0403 10/20/23 0335  WBC 7.3 4.7 4.0  NEUTROABS 5.4  --   --   HGB 13.5 12.7 11.4*  HCT 40.5 39.2 34.7*  MCV 90.2 94.2 91.8  PLT 140* 119* 107*   Basic Metabolic Panel: Recent Labs  Lab 10/18/23 0218 10/18/23 0300 10/19/23 0403 10/20/23 0335  NA 134*  --  134* 133*  K 3.5  --  3.8 3.7  CL 98  --  99 100  CO2 22  --  26 25  GLUCOSE 151*  --  331* 248*  BUN 27*  --  28* 29*  CREATININE 1.23*  --  1.37* 1.08*  CALCIUM 9.3  --  9.2 9.0  MG  --  2.1  --  1.7  PHOS  --   --   --  3.1   GFR: Estimated Creatinine Clearance: 45.1 mL/min (A) (by C-G formula based on SCr of 1.08 mg/dL (H)). Liver Function Tests: Recent Labs  Lab 10/18/23 0218  AST 43*  ALT 43  ALKPHOS 128*  BILITOT 1.4*  PROT 6.4*  ALBUMIN 3.4*   No results for input(s): "LIPASE", "AMYLASE" in the last 168 hours. No results for input(s): "AMMONIA" in the last 168 hours. Coagulation Profile: No results for input(s): "INR", "PROTIME" in the last 168 hours. Cardiac Enzymes: No results for input(s): "CKTOTAL", "CKMB", "CKMBINDEX", "TROPONINI" in the last 168 hours. BNP (last 3 results) No results for input(s): "PROBNP" in the last 8760 hours. HbA1C: Recent Labs    10/19/23 0403  HGBA1C 7.9*   CBG: Recent Labs  Lab 10/19/23 0747 10/19/23 1149 10/19/23 1717 10/19/23 2117 10/20/23 0901  GLUCAP 255* 258* 302* 230* 154*   Lipid Profile: No results for input(s): "CHOL", "HDL", "LDLCALC", "TRIG", "CHOLHDL", "LDLDIRECT" in the last 72 hours. Thyroid Function Tests: Recent Labs    10/19/23 1148  TSH 1.053   Anemia Panel: No results for input(s): "VITAMINB12", "FOLATE", "FERRITIN", "TIBC", "IRON", "RETICCTPCT" in the last 72 hours. Sepsis  Labs: No results for input(s): "PROCALCITON", "LATICACIDVEN" in the last 168 hours.  Recent Results (from the past 240 hours)  SARS Coronavirus 2 by RT PCR (hospital order, performed in [facility] hospital lab) *cepheid single result test* Anterior Nasal Swab     Status: Abnormal   Collection Time: 10/18/23  6:12 AM   Specimen: Anterior Nasal Swab  Result Value Ref Range Status   SARS Coronavirus 2 by RT PCR POSITIVE (A) NEGATIVE Final    Comment: (NOTE) SARS-CoV-2 target nucleic acids are DETECTED  SARS-CoV-2 RNA is generally detectable in upper respiratory specimens  during the acute phase of infection.  Positive results are indicative  of the presence of the identified virus, but do not rule out bacterial infection or co-infection with other pathogens not detected by the test.  Clinical correlation with patient history and  other diagnostic information is necessary to determine patient infection status.  The expected result is negative.  Fact Sheet for Patients:   RoadLapTop.co.za   Fact Sheet for Healthcare Providers:   http://kim-miller.com/    This test is not yet approved or cleared by the [place] FDA and  has been authorized for detection and/or diagnosis of SARS-CoV-2 by FDA under an Emergency Use Authorization (EUA).  This EUA will remain in effect (meaning this test can be used) for the duration of  the COVID-19 declaration under Section 564(b)(1)  of the Act, 21 U.S.C. section 360-bbb-3(b)(1), unless the authorization is terminated or revoked sooner.   Performed at [facility], [address], [place], [place] [address]          Radiology Studies: No results found.         LOS: 1 day   Time spent= 35 mins    Miguel Rota, MD [facility]  If 7PM-7AM, please contact night-coverage  10/20/2023, 12:17 PM

## 2023-10-20 NOTE — Progress Notes (Signed)
  Echocardiogram 2D Echocardiogram has been performed.  [name] [name] 10/20/2023, 9:42 AM

## 2023-10-21 ENCOUNTER — Ambulatory Visit: Admitting: Cardiovascular Disease

## 2023-10-21 DIAGNOSIS — R42 Dizziness and giddiness: Secondary | ICD-10-CM | POA: Diagnosis not present

## 2023-10-21 LAB — BASIC METABOLIC PANEL WITH GFR
Anion gap: 7 (ref 5–15)
BUN: 25 mg/dL — ABNORMAL HIGH (ref 8–23)
CO2: 26 mmol/L (ref 22–32)
Calcium: 9.1 mg/dL (ref 8.9–10.3)
Chloride: 104 mmol/L (ref 98–111)
Creatinine, Ser: 1.06 mg/dL — ABNORMAL HIGH (ref 0.44–1.00)
GFR, Estimated: 54 mL/min — ABNORMAL LOW (ref >60)
Glucose, Bld: 154 mg/dL — ABNORMAL HIGH (ref 70–99)
Potassium: 3.9 mmol/L (ref 3.5–5.1)
Sodium: 137 mmol/L (ref 135–145)

## 2023-10-21 LAB — MAGNESIUM: Magnesium: 1.8 mg/dL (ref 1.7–2.4)

## 2023-10-21 LAB — CBC
HCT: 34.5 % — ABNORMAL LOW (ref 36.0–46.0)
Hemoglobin: 11.3 g/dL — ABNORMAL LOW (ref 12.0–15.0)
MCH: 30.3 pg (ref 26.0–34.0)
MCHC: 32.8 g/dL (ref 30.0–36.0)
MCV: 92.5 fL (ref 80.0–100.0)
Platelets: 112 10*3/uL — ABNORMAL LOW (ref 150–400)
RBC: 3.73 MIL/uL — ABNORMAL LOW (ref 3.87–5.11)
RDW: 13.4 % (ref 11.5–15.5)
WBC: 4.1 10*3/uL (ref 4.0–10.5)
nRBC: 0 % (ref 0.0–0.2)

## 2023-10-21 LAB — GLUCOSE, CAPILLARY
Glucose-Capillary: 102 mg/dL — ABNORMAL HIGH (ref 70–99)
Glucose-Capillary: 232 mg/dL — ABNORMAL HIGH (ref 70–99)
Glucose-Capillary: 385 mg/dL — ABNORMAL HIGH (ref 70–99)
Glucose-Capillary: 114 mg/dL — ABNORMAL HIGH (ref 70–99)

## 2023-10-21 MED ORDER — CEPHALEXIN 500 MG PO CAPS: 500.0000 mg | ORAL_CAPSULE | Freq: Three times a day (TID) | ORAL | Status: DC

## 2023-10-21 MED ORDER — TRAMADOL HCL 50 MG PO TABS: 50.0000 mg | ORAL_TABLET | Freq: Three times a day (TID) | ORAL | 0 refills | Status: DC | PRN

## 2023-10-21 NOTE — TOC Progression Note (Signed)
 Transition of Care [facility]) - Progression Note    Patient Details  Name: [name] [name] MRN: [id number] Date of Birth: 09-14-1945  [facility]) CM/SW Contact  Larrie Kass, LCSW Phone Number: 10/21/2023, 3:49 PM  Clinical Narrative:     CSW presented bed offers to pt's daughter for review. TOC to follow.   Expected Discharge Plan: Skilled Nursing Facility Barriers to Discharge: Insurance Authorization  Expected Discharge Plan and Services       Living arrangements for the past 2 months: Single Family Home                                       Social Determinants of Health (SDOH) Interventions SDOH Screenings   Food Insecurity: No Food Insecurity (10/18/2023)  Housing: Low Risk  (10/18/2023)  Transportation Needs: No Transportation Needs (10/18/2023)  Utilities: Not At Risk (10/18/2023)  Depression (PHQ2-9): High Risk (07/11/2023)  Financial Resource Strain: Low Risk  (01/14/2022)  Physical Activity: Inactive (01/14/2022)  Social Connections: Moderately Integrated (10/18/2023)  Stress: No Stress Concern Present (01/14/2022)  Tobacco Use: Low Risk  (10/18/2023)    Readmission Risk Interventions     No data to display

## 2023-10-21 NOTE — TOC Progression Note (Signed)
 Transition of Care [facility]) - Progression Note    Patient Details  Name: [name] [name] MRN: [id number] Date of Birth: 03-03-1946  Transition of Care [facility]) CM/SW Contact  Larrie Kass, LCSW Phone Number: 10/21/2023, 12:00 PM  Clinical Narrative:    Csw spoke with pt's daughter to discuss recommendations for SNF rehab. Pt 's daughter has agreed, but would like [facility] or [facility]. Pt's daughter reported she would not want her mother's information sent to [facility], [facility] , or [facility]. CSW explained the process, noting pt will need insurance authorization. CSW to fax pt out for SNF placement. TOC to follow.   Expected Discharge Plan: Skilled Nursing Facility Barriers to Discharge: Insurance Authorization  Expected Discharge Plan and Services       Living arrangements for the past 2 months: Single Family Home                                       Social Determinants of Health (SDOH) Interventions SDOH Screenings   Food Insecurity: No Food Insecurity (10/18/2023)  Housing: Low Risk  (10/18/2023)  Transportation Needs: No Transportation Needs (10/18/2023)  Utilities: Not At Risk (10/18/2023)  Depression (PHQ2-9): High Risk (07/11/2023)  Financial Resource Strain: Low Risk  (01/14/2022)  Physical Activity: Inactive (01/14/2022)  Social Connections: Moderately Integrated (10/18/2023)  Stress: No Stress Concern Present (01/14/2022)  Tobacco Use: Low Risk  (10/18/2023)    Readmission Risk Interventions     No data to display

## 2023-10-21 NOTE — NC FL2 (Signed)
 [place] MEDICAID FL2 LEVEL OF CARE FORM     IDENTIFICATION  Patient Name: [name] [name] Birthdate: October 04, 1945 Sex: female Admission Date (Current Location): 10/17/2023  [facility] and [place] Number:  Producer, television/film/video and Address:  [facility],  [address], [place] [address]      Provider Number: [id number]  Attending Physician Name and Address:  Miguel Rota, MD  Relative Name and Phone Number:  Lowanda Foster (Daughter)  [phone] (Mobile)    Current Level of Care: SNF Recommended Level of Care: Skilled Nursing Facility Prior Approval Number:    Date Approved/Denied:   PASRR Number: [id number]  Discharge Plan: Home    Current Diagnoses: Patient Active Problem List   Diagnosis Date Noted   Dizziness 10/18/2023   Near syncope 10/18/2023   MDD (major depressive disorder), recurrent episode, moderate (HCC) 07/11/2023   Confusion 07/08/2023   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   Acute on chronic low back pain 06/08/2023   Intractable low back pain 06/06/2023   CKD stage 3a, GFR 45-59 ml/min (HCC) 06/06/2023   Lumbar radiculopathy 06/06/2023   Cirrhosis (HCC)    Renal cell cancer (HCC) 09/12/2022   Aortic atherosclerosis (HCC) 06/04/2022   Renal mass, left 05/27/2022   Swelling of limb 04/12/2022   Osteopenia 09/21/2021   History of nephrolithiasis 09/21/2021   Sensation of pressure in bladder area 09/21/2021   Anemia 08/17/2019   Hypertension 08/17/2019   Anxiety 08/17/2019   Lower GI bleeding 08/17/2019   Acute lower GI bleeding 08/16/2019   Autoimmune hepatitis (HCC) 12/29/2018   Chest pain 12/22/2018   Asthmatic bronchitis with exacerbation 03/21/2017   Elevated LFTs 12/24/2016   SVT (supraventricular tachycardia) (HCC) 12/24/2016   Peripheral edema 12/24/2016   S/P foot surgery, right 07/27/2015   Preoperative clearance 06/29/2015   Angioedema 08/30/2014   Recurrent UTI 01/06/2014   Chronic insomnia 01/06/2014   Acute  bilateral low back pain without sciatica 03/07/2011   Vitamin D deficiency 08/20/2010   TINNITUS, CHRONIC, 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   Lung nodule 08/03/2007   CYST AND PSEUDOCYST OF PANCREAS 08/03/2007   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   TUBULOVILLOUS ADENOMA, COLON 12/30/2006   HYPERCHOLESTEROLEMIA 12/30/2006   OBESITY 12/30/2006   TREMOR, ESSENTIAL 12/30/2006   Palpitations 12/30/2006   Seasonal and perennial allergic rhinitis 12/30/2006   GERD 12/30/2006   DIVERTICULOSIS, COLON 12/30/2006   IBS 12/30/2006   Obstructive sleep apnea 12/30/2006   URINARY INCONTINENCE 12/30/2006   Poorly controlled type 2 diabetes mellitus (HCC) 12/22/2006    Orientation RESPIRATION BLADDER Height & Weight     Time, Situation, Place, Self  O2 (1L) Incontinent Weight: 187 lb 9.8 oz (85.1 kg) Height:  5\' 3"  (160 cm)  BEHAVIORAL SYMPTOMS/MOOD NEUROLOGICAL BOWEL NUTRITION STATUS      Continent Diet (CARB)  AMBULATORY STATUS COMMUNICATION OF NEEDS Skin   Limited Assist Verbally Normal                       Personal Care Assistance Level of Assistance  Bathing, Feeding, Dressing Bathing Assistance: Limited assistance Feeding assistance: Independent Dressing Assistance: Limited assistance     Functional Limitations Info  Sight, Hearing, Speech Sight Info: Adequate Hearing Info: Adequate Speech Info: Adequate    SPECIAL CARE FACTORS FREQUENCY  PT (By licensed PT), OT (By licensed OT)     PT Frequency: 5 x a week OT Frequency: 5 x a week            Contractures  Contractures Info: Not present    Additional Factors Info  Code Status, Allergies, Psychotropic Code Status Info: full Allergies Info: Codeine  Doxycycline  Epinephrine Hcl  Erythromycin Ethylsuccinate  Eucalyptus Oil  Iohexol  Menthol  Metformin  Oxycodone  Tapes           Current Medications (10/21/2023):  This is the current hospital active medication list Current Facility-Administered Medications  Medication  Dose Route Frequency Provider Last Rate Last Admin   acetaminophen (TYLENOL) tablet 650 mg  650 mg Oral Q6H PRN Kirby Crigler, Mir M, MD   650 mg at 10/18/23 4098   Or   acetaminophen (TYLENOL) suppository 650 mg  650 mg Rectal Q6H PRN Kirby Crigler, Mir M, MD       albuterol (PROVENTIL) (2.5 MG/3ML) 0.083% nebulizer solution 2.5 mg  2.5 mg Nebulization Q2H PRN Kirby Crigler, Mir M, MD       cephALEXin (KEFLEX) capsule 500 mg  500 mg Oral Q8H Amin, Ankit C, MD   500 mg at 10/21/23 0557   diltiazem (CARDIZEM) 1 mg/mL load via infusion 15 mg  15 mg Intravenous Once Kirby Crigler, Mir M, MD       diltiazem (CARDIZEM) 125 mg in dextrose 5% 125 mL (1 mg/mL) infusion  5-15 mg/hr Intravenous Continuous Kirby Crigler, Mir M, MD       DULoxetine (CYMBALTA) DR capsule 60 mg  60 mg Oral Daily Kirby Crigler, Mir M, MD   60 mg at 10/21/23 1191   guaiFENesin (ROBITUSSIN) 100 MG/5ML liquid 5 mL  5 mL Oral Q4H PRN Amin, Ankit C, MD       heparin injection 5,000 Units  5,000 Units Subcutaneous Q8H Kirby Crigler, Mir M, MD   5,000 Units at 10/21/23 4782   hydrALAZINE (APRESOLINE) injection 10 mg  10 mg Intravenous Q4H PRN Amin, Ankit C, MD       hydrOXYzine (ATARAX) tablet 25 mg  25 mg Oral BID PRN Kirby Crigler, Mir M, MD       insulin aspart (novoLOG) injection 0-15 Units  0-15 Units Subcutaneous TID WC Kirby Crigler, Mir M, MD   3 Units at 10/20/23 1831   insulin aspart (novoLOG) injection 0-5 Units  0-5 Units Subcutaneous QHS Kirby Crigler, Mir M, MD   5 Units at 10/20/23 2144   insulin aspart (novoLOG) injection 2 Units  2 Units Subcutaneous TID WC Amin, Ankit C, MD   2 Units at 10/21/23 0925   insulin glargine-yfgn (SEMGLEE) injection 14 Units  14 Units Subcutaneous BID Amin, Ankit C, MD   14 Units at 10/21/23 0925   loratadine (CLARITIN) tablet 10 mg  10 mg Oral Daily Kirby Crigler, Mir M, MD   10 mg at 10/21/23 0923   ondansetron (ZOFRAN) tablet 4 mg  4 mg Oral Q6H PRN Kirby Crigler, Mir M, MD       Or   ondansetron [facility]) injection 4 mg   4 mg Intravenous Q6H PRN Kirby Crigler, Mir M, MD       pantoprazole (PROTONIX) EC tablet 40 mg  40 mg Oral Daily Kirby Crigler, Mir M, MD   40 mg at 10/21/23 9562   polyethylene glycol (MIRALAX / GLYCOLAX) packet 17 g  17 g Oral Daily PRN Amin, Ankit C, MD       predniSONE (DELTASONE) tablet 10 mg  10 mg Oral Q breakfast Kirby Crigler, Mir M, MD   10 mg at 10/21/23 1308   senna-docusate (Senokot-S) tablet 1 tablet  1 tablet Oral QHS PRN Amin, Ankit C, MD       traMADol (  ULTRAM) tablet 50 mg  50 mg Oral Q8H PRN Maryln Gottron, MD   50 mg at 10/21/23 9811     Discharge Medications: Please see discharge summary for a list of discharge medications.  Relevant Imaging Results:  Relevant Lab Results:   Additional Information SSN:[id number]  Valentina Shaggy Lillard Bailon, LCSW

## 2023-10-21 NOTE — Progress Notes (Signed)
 PROGRESS NOTE    [name] [name]  ZOX:[id number] DOB: 02-26-1946 DOA: 10/17/2023 PCP: Excell Seltzer, MD    Brief Narrative:  78 year old with history of insulin-dependent diabetes, autoimmune cirrhosis, CHF, depression, CKD stage II comes to the hospital with orthostasis, fall at home.  Patient is found to have COVID-19 infection and urinary tract infection.  Trauma workup does not show any acute pathology.  Upon admission patient was asymptomatic from COVID-19 infection standpoint therefore receiving supportive treatment.  For UTI placed on IV Rocephin.  PT/OT for weakness.  During the hospitalization Trauma workup including CT head, cervical, thoracic spine, CAP negative for acute pathology.  Bilateral shoulder x-rays, right foot and right knee, right hip x-ray are all negative for acute fracture or dislocation.  Vitamin D and TSH is normal.  Occasionally positive orthostatics.  I suspect she suffers quite a bit from autonomic dysfunction due to advanced age and diabetes.  She has been educated on how to prevent falls secondary to autonomic dysfunction.  Compression stocking was ordered.  Echocardiogram was unremarkable with preserved EF.  PT/OT recommended SNF.  TOC currently working on placement.   Assessment & Plan:  Principal Problem:   Dizziness Active Problems:   Near syncope   Fall - Trauma workup including CT head, cervical, thoracic spine, CAP negative for acute pathology.  Bilateral shoulder x-rays, right foot and right knee, right hip x-ray are all negative for acute fracture or dislocation.  Vitamin D and TSH is normal.  Occasionally positive orthostatics.  I suspect she suffers quite a bit from autonomic dysfunction due to advanced age and diabetes.  She has been educated on how to prevent falls secondary to autonomic dysfunction. - Compression stocking -PT/OT-SNF.  COVID-19 infection -No evidence of hypoxia.  Supportive care  Urinary tract infection -Urine cultures not  sent, already on Rocephin > transition to Keflex.  EOT 4/2 -Previously has grown pansensitive Citrobacter and Proteus.  Insulin-dependent diabetes mellitus -On Semglee, sliding scale and Accu-Chek.  Will further adjust as necessary -A1c 7.9 -On outpatient insulin pump  History of congestive heart failure, preserved EF - Currently on Lasix - Echocardiogram this admission shows preserved EF grade 1 DD  CKD stage II History of renal cell carcinoma status post partial nephrectomy January 2024 - Crea stable at 1.0  History of autoimmune cirrhosis -On daily prednisone  Depression - Cymbalta  GERD - PPI  Lung nodules - Follow-up outpatient pulmonary and oncology   DVT prophylaxis: heparin injection 5,000 Units Start: 10/18/23 0930    Code Status: Full Code Family Communication: [name] up to date. SNF placement.    Subjective:  Pain well no complaints awaiting SNF placement  Examination:  General exam: Appears calm and comfortable  Respiratory system: Clear to auscultation. Respiratory effort normal. Cardiovascular system: S1 & S2 heard, RRR. No JVD, murmurs, rubs, gallops or clicks. No pedal edema. Gastrointestinal system: Abdomen is nondistended, soft and nontender. No organomegaly or masses felt. Normal bowel sounds heard. Central nervous system: Alert and oriented. No focal neurological deficits. Extremities: Symmetric 5 x 5 power. Skin: No rashes, lesions or ulcers Psychiatry: Judgement and insight appear normal. Mood & affect appropriate.                Diet Orders (From admission, onward)     Start     Ordered   10/18/23 0830  Diet Carb Modified Fluid consistency: Thin; Room service appropriate? Yes  Diet effective now       Question Answer Comment  Diet-HS  Snack? Nothing   Calorie Level Medium 1600-2000   Fluid consistency: Thin   Room service appropriate? Yes      10/18/23 0830            Objective: Vitals:   10/20/23 2017 10/20/23  2138 10/21/23 0431 10/21/23 0923  BP: (!) 134/58 (!) 126/53 136/69 (!) 142/76  Pulse: 91 75 81 76  Resp: 18 20 19 16   Temp: 98.6 F (37 C) 98.5 F (36.9 C) 97.7 F (36.5 C) 98.6 F (37 C)  TempSrc: Oral Oral  Oral  SpO2: 100% 99% 100% 100%  Weight:      Height:        Intake/Output Summary (Last 24 hours) at 10/21/2023 1230 Last data filed at 10/21/2023 0900 Gross per 24 hour  Intake 893.8 ml  Output --  Net 893.8 ml   Filed Weights   10/18/23 0600  Weight: 85.1 kg    Scheduled Meds:  cephALEXin  500 mg Oral Q8H   diltiazem  15 mg Intravenous Once   DULoxetine  60 mg Oral Daily   heparin  5,000 Units Subcutaneous Q8H   insulin aspart  0-15 Units Subcutaneous TID WC   insulin aspart  0-5 Units Subcutaneous QHS   insulin aspart  2 Units Subcutaneous TID WC   insulin glargine-yfgn  14 Units Subcutaneous BID   loratadine  10 mg Oral Daily   pantoprazole  40 mg Oral Daily   predniSONE  10 mg Oral Q breakfast   Continuous Infusions:  diltiazem (CARDIZEM) infusion      Nutritional status     Body mass index is 33.23 kg/m.  Data Reviewed:   CBC: Recent Labs  Lab 10/18/23 0218 10/19/23 0403 10/20/23 0335 10/21/23 0413  WBC 7.3 4.7 4.0 4.1  NEUTROABS 5.4  --   --   --   HGB 13.5 12.7 11.4* 11.3*  HCT 40.5 39.2 34.7* 34.5*  MCV 90.2 94.2 91.8 92.5  PLT 140* 119* 107* 112*   Basic Metabolic Panel: Recent Labs  Lab 10/18/23 0218 10/18/23 0300 10/19/23 0403 10/20/23 0335 10/21/23 0413  NA 134*  --  134* 133* 137  K 3.5  --  3.8 3.7 3.9  CL 98  --  99 100 104  CO2 22  --  26 25 26   GLUCOSE 151*  --  331* 248* 154*  BUN 27*  --  28* 29* 25*  CREATININE 1.23*  --  1.37* 1.08* 1.06*  CALCIUM 9.3  --  9.2 9.0 9.1  MG  --  2.1  --  1.7 1.8  PHOS  --   --   --  3.1  --    GFR: Estimated Creatinine Clearance: 46 mL/min (A) (by C-G formula based on SCr of 1.06 mg/dL (H)). Liver Function Tests: Recent Labs  Lab 10/18/23 0218  AST 43*  ALT 43  ALKPHOS  128*  BILITOT 1.4*  PROT 6.4*  ALBUMIN 3.4*   No results for input(s): "LIPASE", "AMYLASE" in the last 168 hours. No results for input(s): "AMMONIA" in the last 168 hours. Coagulation Profile: No results for input(s): "INR", "PROTIME" in the last 168 hours. Cardiac Enzymes: No results for input(s): "CKTOTAL", "CKMB", "CKMBINDEX", "TROPONINI" in the last 168 hours. BNP (last 3 results) No results for input(s): "PROBNP" in the last 8760 hours. HbA1C: Recent Labs    10/19/23 0403  HGBA1C 7.9*   CBG: Recent Labs  Lab 10/20/23 1226 10/20/23 1635 10/20/23 2140 10/21/23 0730 10/21/23 1146  GLUCAP  199* 190* 363* 102* 232*   Lipid Profile: No results for input(s): "CHOL", "HDL", "LDLCALC", "TRIG", "CHOLHDL", "LDLDIRECT" in the last 72 hours. Thyroid Function Tests: Recent Labs    10/19/23 1148  TSH 1.053   Anemia Panel: No results for input(s): "VITAMINB12", "FOLATE", "FERRITIN", "TIBC", "IRON", "RETICCTPCT" in the last 72 hours. Sepsis Labs: No results for input(s): "PROCALCITON", "LATICACIDVEN" in the last 168 hours.  Recent Results (from the past 240 hours)  SARS Coronavirus 2 by RT PCR (hospital order, performed in [facility] hospital lab) *cepheid single result test* Anterior Nasal Swab     Status: Abnormal   Collection Time: 10/18/23  6:12 AM   Specimen: Anterior Nasal Swab  Result Value Ref Range Status   SARS Coronavirus 2 by RT PCR POSITIVE (A) NEGATIVE Final    Comment: (NOTE) SARS-CoV-2 target nucleic acids are DETECTED  SARS-CoV-2 RNA is generally detectable in upper respiratory specimens  during the acute phase of infection.  Positive results are indicative  of the presence of the identified virus, but do not rule out bacterial infection or co-infection with other pathogens not detected by the test.  Clinical correlation with patient history and  other diagnostic information is necessary to determine patient infection status.  The expected result is  negative.  Fact Sheet for Patients:   RoadLapTop.co.za   Fact Sheet for Healthcare Providers:   http://kim-miller.com/    This test is not yet approved or cleared by the [place] FDA and  has been authorized for detection and/or diagnosis of SARS-CoV-2 by FDA under an Emergency Use Authorization (EUA).  This EUA will remain in effect (meaning this test can be used) for the duration of  the COVID-19 declaration under Section 564(b)(1)  of the Act, 21 U.S.C. section 360-bbb-3(b)(1), unless the authorization is terminated or revoked sooner.   Performed at [facility], [address] [place], [place] [address]          Radiology Studies: ECHOCARDIOGRAM COMPLETE Result Date: 10/20/2023    ECHOCARDIOGRAM REPORT   Patient Name:   [name] [name] Date of Exam: 10/20/2023 Medical Rec #:  [id number]     Height:       63.0 in Accession #:    [id number]    Weight:       187.6 lb Date of Birth:  June 16, 1946      BSA:          1.882 m Patient Age:    77 years      BP:           130/81 mmHg Patient Gender: F             HR:           75 bpm. Exam Location:  Inpatient Procedure: 2D Echo, Cardiac Doppler and Color Doppler (Both Spectral and Color            Flow Doppler were utilized during procedure). Indications:    I42.9 Cardiomyopathy (unspecified)  History:        Patient has prior history of Echocardiogram examinations, most                 recent 07/16/2022. CHF, Abnormal ECG, Arrythmias:SVT,                 Signs/Symptoms:Dizziness/Lightheadedness, Syncope and Altered                 Mental Status; Risk Factors:Hypertension, Diabetes, Dyslipidemia  and Sleep Apnea. Covid positive. Cancer.  Sonographer:    Sheralyn Boatman RDCS Referring Phys: [id number] Miguel Rota  Sonographer Comments: Technically difficult study due to poor echo windows and suboptimal parasternal window. Image acquisition challenging due to patient body  habitus. Patient coughing througout exam. IMPRESSIONS  1. 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 There is no outflow obstruction. Left ventricular ejection fraction, by estimation, is 60 to 65%. The left ventricle has normal function. The left ventricle has no regional wall motion abnormalities. Left ventricular diastolic parameters are consistent with Grade I diastolic dysfunction (impaired relaxation).  2. Right ventricular systolic function is normal. The right ventricular size is normal.  3. The mitral valve is grossly normal. Trivial mitral valve regurgitation. No evidence of mitral stenosis. Moderate mitral annular calcification.  4. The aortic valve was not well visualized. There is mild calcification of the aortic valve. Aortic valve regurgitation is not visualized. Aortic valve sclerosis/calcification is present, without any evidence of aortic stenosis.  5. The inferior vena cava is normal in size with greater than 50% respiratory variability, suggesting right atrial pressure of 3 mmHg. Comparison(s): Prior images reviewed side by side. The LV basal septal mobile echodensity was not previously seen. Conclusion(s)/Recommendation(s): Consider TEE to clarify the left ventricular basal septum mobile echodensity, if the patient has a history of unexplained embolic events and would be a candidate for cardiac surgery. 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 There is no outflow obstruction. Left ventricular ejection fraction, by estimation, is 60 to 65%. The left ventricle has normal function. The left ventricle has no regional wall motion abnormalities. The left ventricular  internal cavity size was normal in size. There is no left ventricular hypertrophy. Left ventricular diastolic parameters are consistent with Grade I diastolic dysfunction (impaired relaxation). Right Ventricle: The right ventricular size is normal. No increase in right ventricular wall thickness. Right ventricular systolic function is normal. Left Atrium: Left atrial size was normal in size. Right Atrium: Right atrial size was normal in size. Pericardium: There is no evidence of pericardial effusion. Mitral Valve: The mitral valve is grossly normal. There is moderate calcification of the mitral valve leaflet(s). Moderate mitral annular calcification. Trivial mitral valve regurgitation. No evidence of mitral valve stenosis. MV peak gradient, 6.1 mmHg.  The mean mitral valve gradient is 2.0 mmHg. Tricuspid Valve: The tricuspid valve is normal in structure. Tricuspid valve regurgitation is trivial. No evidence of tricuspid stenosis. Aortic Valve: The aortic valve was not well visualized. There is mild calcification of the aortic valve. Aortic valve regurgitation is not visualized. Aortic valve sclerosis/calcification is present, without any evidence of aortic stenosis. Pulmonic Valve: The pulmonic valve was not well visualized. Pulmonic valve regurgitation is not visualized. No evidence of pulmonic stenosis. Aorta: The aortic root and ascending aorta are structurally normal, with no evidence of dilitation. Venous: The inferior vena cava is normal in size with greater than 50% respiratory variability, suggesting right atrial pressure of 3 mmHg. IAS/Shunts: No atrial level shunt detected by color flow Doppler.  LEFT VENTRICLE PLAX 2D LVIDd:         3.80 cm     Diastology LVIDs:         2.60 cm  LV e' medial:    6.09 cm/s LV PW:         0.90 cm     LV E/e' medial:  11.5 LV IVS:        0.90 cm     LV e' lateral:   6.53 cm/s LVOT diam:     2.20 cm     LV E/e' lateral: 10.7 LV SV:         73 LV SV Index:   39 LVOT Area:      3.80 cm  LV Volumes (MOD) LV vol d, MOD A2C: 54.6 ml LV vol d, MOD A4C: 55.4 ml LV vol s, MOD A2C: 17.4 ml LV vol s, MOD A4C: 18.5 ml LV SV MOD A2C:     37.2 ml LV SV MOD A4C:     55.4 ml LV SV MOD BP:      38.7 ml RIGHT VENTRICLE             IVC RV S prime:     13.50 cm/s  IVC diam: 1.30 cm TAPSE (M-mode): 2.0 cm LEFT ATRIUM             Index        RIGHT ATRIUM           Index LA diam:        3.60 cm 1.91 cm/m   RA Area:     14.90 cm LA Vol (A2C):   37.6 ml 19.98 ml/m  RA Volume:   36.90 ml  19.61 ml/m LA Vol (A4C):   22.4 ml 11.90 ml/m LA Biplane Vol: 29.4 ml 15.62 ml/m  AORTIC VALVE LVOT Vmax:   117.00 cm/s LVOT Vmean:  74.500 cm/s LVOT VTI:    0.193 m  AORTA Ao Root diam: 2.80 cm Ao Asc diam:  3.20 cm MITRAL VALVE MV Area (PHT): 2.84 cm     SHUNTS MV Area VTI:   2.49 cm     Systemic VTI:  0.19 m MV Peak grad:  6.1 mmHg     Systemic Diam: 2.20 cm MV Mean grad:  2.0 mmHg MV Vmax:       1.23 m/s MV Vmean:      63.2 cm/s MV Decel Time: 267 msec MV E velocity: 69.85 cm/s MV A velocity: 103.68 cm/s MV E/A ratio:  0.67 Mihai Croitoru MD Electronically signed by Thurmon Fair MD Signature Date/Time: 10/20/2023/12:53:51 PM    Final            LOS: 2 days   Time spent= 35 mins    Miguel Rota, MD [facility]  If 7PM-7AM, please contact night-coverage  10/21/2023, 12:30 PM

## 2023-10-21 NOTE — Progress Notes (Signed)
 Physical Therapy Treatment Patient Details Name: [name] [name] MRN: [id number] DOB: 1945/09/10 Today's Date: 10/21/2023   History of Present Illness Patient is a 78 year old female who presented on 3/28 after fall at home. Patient was admitted with COVID 19, and UTI. X-rays were negative for fractures. PMH: CHF, depression, GERD, lung nodules, insulin dependent DM.    PT Comments  Pt requesting assistance to bathroom. CGA-Min A for mobility on today-LOB x 1 during session. O2 98% on RA, HR 103 bpm. Pt reported mild lightheadedness during session. BP okay-not orthostatic. Pt reports she is open to continued rehab. Patient will benefit from continued inpatient follow up therapy, <3 hours/day     If plan is discharge home, recommend the following: A little help with walking and/or transfers;A little help with bathing/dressing/bathroom;Assistance with cooking/housework;Assist for transportation;Help with stairs or ramp for entrance   Can travel by private vehicle        Equipment Recommendations  None recommended by PT    Recommendations for Other Services       Precautions / Restrictions Precautions Precautions: Fall Restrictions Weight Bearing Restrictions Per Provider Order: No     Mobility  Bed Mobility Overal bed mobility: Modified Independent Bed Mobility: Sit to Supine       Sit to supine: Modified independent (Device/Increase time)   General bed mobility comments: increased time    Transfers Overall transfer level: Needs assistance Equipment used: Rolling walker (2 wheels) Transfers: Sit to/from Stand Sit to Stand: Min assist           General transfer comment: Cues for safety, hand placement. Increased time. Pt denied dizziness. LOB x 1 posteriorly in bathroom when prepping to pull up pants    Ambulation/Gait Ambulation/Gait assistance: Contact guard assist Gait Distance (Feet): 15 Feet (x2) Assistive device: Rolling walker (2 wheels) Gait  Pattern/deviations: Step-to pattern, Decreased stride length       General Gait Details: Slow gait speed. No LOB while walking from bathroom to bedside with RW. O2 98% on RA, HR 103 bpm   Stairs             Wheelchair Mobility     Tilt Bed    Modified Rankin (Stroke Patients Only)       Balance Overall balance assessment: Needs assistance, History of Falls         Standing balance support: Reliant on assistive device for balance, Bilateral upper extremity supported Standing balance-Leahy Scale: Poor                              Communication Communication Communication: No apparent difficulties  Cognition Arousal: Alert Behavior During Therapy: WFL for tasks assessed/performed   PT - Cognitive impairments: No apparent impairments                         Following commands: Intact      Cueing Cueing Techniques: Verbal cues  Exercises      General Comments        Pertinent Vitals/Pain Pain Assessment Pain Assessment: Faces Faces Pain Scale: Hurts little more Pain Location: low back, buttocks, genital area (from fall at home) Pain Descriptors / Indicators: Discomfort Pain Intervention(s): Limited activity within patient's tolerance, Monitored during session, Repositioned    Home Living  Prior Function            PT Goals (current goals can now be found in the care plan section) Progress towards PT goals: Progressing toward goals    Frequency    Min 2X/week      PT Plan      Co-evaluation              AM-PAC PT "6 Clicks" Mobility   Outcome Measure  Help needed turning from your back to your side while in a flat bed without using bedrails?: A Little Help needed moving from lying on your back to sitting on the side of a flat bed without using bedrails?: A Little Help needed moving to and from a bed to a chair (including a wheelchair)?: A Little Help needed standing up  from a chair using your arms (e.g., wheelchair or bedside chair)?: A Little Help needed to walk in hospital room?: A Little Help needed climbing 3-5 steps with a railing? : A Lot 6 Click Score: 17    End of Session   Activity Tolerance: Patient tolerated treatment well Patient left: in bed;with call bell/phone within reach   PT Visit Diagnosis: History of falling (Z91.81);Repeated falls (R29.6);Muscle weakness (generalized) (M62.81);Difficulty in walking, not elsewhere classified (R26.2)     Time: 1610-9604 PT Time Calculation (min) (ACUTE ONLY): 27 min  Charges:    $Gait Training: 8-22 mins $Therapeutic Activity: 8-22 mins PT General Charges $$ ACUTE PT VISIT: 1 Visit                        Faye Ramsay, PT Acute Rehabilitation  Office: [phone]

## 2023-10-22 ENCOUNTER — Telehealth: Payer: Self-pay | Admitting: Cardiovascular Disease

## 2023-10-22 DIAGNOSIS — R42 Dizziness and giddiness: Secondary | ICD-10-CM | POA: Diagnosis not present

## 2023-10-22 LAB — BASIC METABOLIC PANEL WITH GFR
Anion gap: 8 (ref 5–15)
BUN: 19 mg/dL (ref 8–23)
CO2: 23 mmol/L (ref 22–32)
Calcium: 9.3 mg/dL (ref 8.9–10.3)
Chloride: 109 mmol/L (ref 98–111)
Creatinine, Ser: 0.88 mg/dL (ref 0.44–1.00)
GFR, Estimated: >60 mL/min (ref >60)
Glucose, Bld: 136 mg/dL — ABNORMAL HIGH (ref 70–99)
Potassium: 3.8 mmol/L (ref 3.5–5.1)
Sodium: 140 mmol/L (ref 135–145)

## 2023-10-22 LAB — CBC
HCT: 34.8 % — ABNORMAL LOW (ref 36.0–46.0)
Hemoglobin: 11.4 g/dL — ABNORMAL LOW (ref 12.0–15.0)
MCH: 30.6 pg (ref 26.0–34.0)
MCHC: 32.8 g/dL (ref 30.0–36.0)
MCV: 93.5 fL (ref 80.0–100.0)
Platelets: 120 10*3/uL — ABNORMAL LOW (ref 150–400)
RBC: 3.72 MIL/uL — ABNORMAL LOW (ref 3.87–5.11)
RDW: 13.5 % (ref 11.5–15.5)
WBC: 4.1 10*3/uL (ref 4.0–10.5)
nRBC: 0 % (ref 0.0–0.2)

## 2023-10-22 LAB — GLUCOSE, CAPILLARY
Glucose-Capillary: 91 mg/dL (ref 70–99)
Glucose-Capillary: 265 mg/dL — ABNORMAL HIGH (ref 70–99)
Glucose-Capillary: 333 mg/dL — ABNORMAL HIGH (ref 70–99)
Glucose-Capillary: 132 mg/dL — ABNORMAL HIGH (ref 70–99)

## 2023-10-22 LAB — MAGNESIUM: Magnesium: 1.7 mg/dL (ref 1.7–2.4)

## 2023-10-22 NOTE — Progress Notes (Signed)
 Occupational Therapy Treatment Patient Details Name: [name] [name] MRN: [id number] DOB: July 05, 1946 Today's Date: 10/22/2023   History of present illness Patient is a 78 year old female who presented on 3/28 after fall at home. Patient was admitted with COVID 19, and UTI. X-rays were negative for fractures. PMH: CHF, depression, GERD, lung nodules, insulin dependent DM.   OT comments  Patient was educated on fall prevention strategies and different communication devices to call for help. Patient verbalized understanding and to discusses with family. Patient was also educated on movement with orthostatic vitals and strategies for toileting. Patient verbalized understanding and reported she would move her reacher into bathroom to use when needed for getting pants off the floor. Patient will benefit from continued inpatient follow up therapy, <3 hours/day.       If plan is discharge home, recommend the following:  A lot of help with bathing/dressing/bathroom;Assistance with cooking/housework;Direct supervision/assist for medications management;Assist for transportation;Help with stairs or ramp for entrance;Direct supervision/assist for financial management;A lot of help with walking and/or transfers;Supervision due to cognitive status   Equipment Recommendations  None recommended by OT    Recommendations for Other Services      Precautions / Restrictions Precautions Precautions: Fall Precaution/Restrictions Comments: orthostatic Restrictions Weight Bearing Restrictions Per Provider Order: No              ADL either performed or assessed with clinical judgement   ADL Overall ADL's : Needs assistance/impaired         General ADL Comments: patient was educated on fall prevention strategies. patient was educated on how to [facility] through ADLs with orthostatics and pacing self throught the day. patient vebralized understanding. patient was educaetd on diffierent versions of life alert out  there and the new smart watches with some of these capacities. patient reproted she would discuss with daughters. patient had questionable answers to what to do in an emergency but was also noted to be a talker during session. patient was untilmately able to give appropriate responses to some of the Kels questions asked with patient having long winded answers even with explination to point of exervise.      Cognition Arousal: Alert Behavior During Therapy: WFL for tasks assessed/performed Cognition: Cognition impaired                                                      Pertinent Vitals/ Pain       Pain Assessment Pain Assessment: No/denies pain         Frequency  Min 2X/week        Progress Toward Goals  OT Goals(current goals can now be found in the care plan section)  Progress towards OT goals: Progressing toward goals     Plan         AM-PAC OT "6 Clicks" Daily Activity     Outcome Measure   Help from another person eating meals?: None Help from another person taking care of personal grooming?: A Little Help from another person toileting, which includes using toliet, bedpan, or urinal?: A Lot Help from another person bathing (including washing, rinsing, drying)?: A Lot Help from another person to put on and taking off regular upper body clothing?: A Lot Help from another person to put on and taking off regular lower body clothing?: A Lot 6 Click Score: 15  End of Session    OT Visit Diagnosis: Unsteadiness on feet (R26.81);Other abnormalities of gait and mobility (R26.89);Pain   Activity Tolerance Patient tolerated treatment well   Patient Left in bed;with call bell/phone within reach   Nurse Communication Mobility status        Time: 1610-9604 OT Time Calculation (min): 32 min  Charges: OT General Charges $OT Visit: 1 Visit OT Treatments $Self Care/Home Management : 23-37 mins  Rosalio Loud, MS Acute Rehabilitation  Department Office# [phone]   Selinda Flavin 10/22/2023, 3:58 PM

## 2023-10-22 NOTE — Progress Notes (Signed)
 Physical Therapy Treatment Patient Details Name: [name] [name] MRN: [id number] DOB: 07/03/1946 Today's Date: 10/22/2023   History of Present Illness Patient is a 78 year old female who presented on 3/28 after fall at home. Patient was admitted with COVID 19, and UTI. X-rays were negative for fractures. PMH: CHF, depression, GERD, lung nodules, insulin dependent DM.    PT Comments  Pt agreeable to working with therapy. She reported mild lightheadedness during session on today. She tolerated ambulation distance well. She reports fatigue and general weakness. Patient will benefit from continued inpatient follow up therapy, <3 hours/day     If plan is discharge home, recommend the following: A little help with walking and/or transfers;A little help with bathing/dressing/bathroom;Assistance with cooking/housework;Assist for transportation;Help with stairs or ramp for entrance   Can travel by private vehicle        Equipment Recommendations  None recommended by PT    Recommendations for Other Services       Precautions / Restrictions Precautions Precautions: Fall Precaution/Restrictions Comments: has been orthostatic Restrictions Weight Bearing Restrictions Per Provider Order: No     Mobility  Bed Mobility Overal bed mobility: Modified Independent                  Transfers Overall transfer level: Needs assistance Equipment used: Rolling walker (2 wheels) Transfers: Sit to/from Stand Sit to Stand: Contact guard assist           General transfer comment: Cues for safety, hand placement. Increased time. Pt reported lightheadedness. Pt had to briefly sit back down. Then able to stand again and proceed with ambulation    Ambulation/Gait Ambulation/Gait assistance: Contact guard assist Gait Distance (Feet): 100 Feet Assistive device: Rolling walker (2 wheels) Gait Pattern/deviations: Step-through pattern, Decreased stride length       General Gait Details: Slow gait  speed. No LOB with RW use. Tolerated distance well. Pt continued to report mild lightheadedness   Stairs             Wheelchair Mobility     Tilt Bed    Modified Rankin (Stroke Patients Only)       Balance Overall balance assessment: History of Falls, Needs assistance         Standing balance support: Reliant on assistive device for balance, Bilateral upper extremity supported Standing balance-Leahy Scale: Poor                              Communication Communication Communication: No apparent difficulties  Cognition Arousal: Alert Behavior During Therapy: WFL for tasks assessed/performed   PT - Cognitive impairments: No apparent impairments                         Following commands: Intact      Cueing Cueing Techniques: Verbal cues  Exercises      General Comments        Pertinent Vitals/Pain Pain Assessment Pain Assessment: Faces Faces Pain Scale: Hurts little more Pain Location: low back, buttocks, genital area (from fall at home) Pain Descriptors / Indicators: Grimacing, Discomfort Pain Intervention(s): Limited activity within patient's tolerance, Monitored during session, Repositioned    Home Living                          Prior Function            PT Goals (current goals can now  be found in the care plan section) Progress towards PT goals: Progressing toward goals    Frequency    Min 2X/week      PT Plan      Co-evaluation              AM-PAC PT "6 Clicks" Mobility   Outcome Measure  Help needed turning from your back to your side while in a flat bed without using bedrails?: None Help needed moving from lying on your back to sitting on the side of a flat bed without using bedrails?: None Help needed moving to and from a bed to a chair (including a wheelchair)?: A Little Help needed standing up from a chair using your arms (e.g., wheelchair or bedside chair)?: A Little Help needed to  walk in hospital room?: A Little Help needed climbing 3-5 steps with a railing? : A Little 6 Click Score: 20    End of Session Equipment Utilized During Treatment: Gait belt Activity Tolerance: Patient tolerated treatment well Patient left: in bed;with call bell/phone within reach;with bed alarm set   PT Visit Diagnosis: History of falling (Z91.81);Repeated falls (R29.6);Muscle weakness (generalized) (M62.81);Difficulty in walking, not elsewhere classified (R26.2)     Time: 6387-5643 PT Time Calculation (min) (ACUTE ONLY): 20 min  Charges:    $Gait Training: 8-22 mins PT General Charges $$ ACUTE PT VISIT: 1 Visit                         Faye Ramsay, PT Acute Rehabilitation  Office: [phone]

## 2023-10-22 NOTE — Telephone Encounter (Signed)
 Returned [employer] phone call (patient's daughter). Verified name and DOB. Daughter wants Dr. Allyson Sabal to review echocardiogram results and to know patient has orthostatic changes that occurred during hospital stay. Informed patient's daughter phone call will be forwarded to Dr. Allyson Sabal to review.   Josie LPN

## 2023-10-22 NOTE — Inpatient Diabetes Management (Signed)
 Inpatient Diabetes Program Recommendations  AACE/ADA: New Consensus Statement on Inpatient Glycemic Control (2015)  Target Ranges:  Prepandial:   less than 140 mg/dL      Peak postprandial:   less than 180 mg/dL (1-2 hours)      Critically ill patients:  140 - 180 mg/dL   Lab Results  Component Value Date   GLUCAP 91 10/22/2023   HGBA1C 7.9 (H) 10/19/2023    Review of Glycemic Control  Latest Reference Range & Units 10/21/23 07:30 10/21/23 11:46 10/21/23 16:49 10/21/23 21:45 10/22/23 08:23  Glucose-Capillary 70 - 99 mg/dL 161 (H) 096 (H) 045 (H) 114 (H) 91  (H): Data is abnormally high  Diabetes history: DM Outpatient Diabetes medications: Medtronic 680G insulin pump with the Guardian CGM-Humalog Current orders for Inpatient glycemic control: Semglee 14 units BID, Novolog 0-15 units TID and 0-5 units at bedtime, Novolog 2 units TID, Prednisone 10 mg QD  Inpatient Diabetes Program Recommendations:    Please consider increasing meal coverage:  Novolog 4 units TID with meals if she consumes at least 50%.  Will continue to follow while inpatient.  Thank you, Dulce Sellar, MSN, CDCES Diabetes Coordinator Inpatient Diabetes Program [phone] (team pager from 8a-5p)

## 2023-10-22 NOTE — TOC Progression Note (Signed)
 Transition of Care [facility]) - Progression Note    Patient Details  Name: [name] [name] MRN: [id number] Date of Birth: 1946-07-01  Transition of [facility]) CM/SW Contact  Larrie Kass, LCSW Phone Number: 10/22/2023, 8:58 AM  Clinical Narrative:    CSW received message from pt's daughter they have chosen [facility] for the pt's rehab. CSW has confirmed with pt and contact HTA to start insurance authorization, which is pending. TOC to follow.     Expected Discharge Plan: Skilled Nursing Facility Barriers to Discharge: Insurance Authorization  Expected Discharge Plan and Services       Living arrangements for the past 2 months: Single Family Home                                       Social Determinants of Health (SDOH) Interventions SDOH Screenings   Food Insecurity: No Food Insecurity (10/18/2023)  Housing: Low Risk  (10/18/2023)  Transportation Needs: No Transportation Needs (10/18/2023)  Utilities: Not At Risk (10/18/2023)  Depression (PHQ2-9): High Risk (07/11/2023)  Financial Resource Strain: Low Risk  (01/14/2022)  Physical Activity: Inactive (01/14/2022)  Social Connections: Moderately Integrated (10/18/2023)  Stress: No Stress Concern Present (01/14/2022)  Tobacco Use: Low Risk  (10/18/2023)    Readmission Risk Interventions     No data to display

## 2023-10-22 NOTE — Telephone Encounter (Signed)
 Patient is planning to be released from the hospital tomorrow and enter a rehab facility. Patient's daughter is concerned about the Cardiac CT results that the patient had. Patient's daughter stated the results mentioned possibly needing a TEE Test completed. Patient's daughter is requesting for Dr. Allyson Sabal to review the results and give his recommendations before the patient enters rehab tomorrow. Please advise.

## 2023-10-22 NOTE — Discharge Summary (Signed)
 Physician Discharge Summary  Rei Contee AVW:[id number] DOB: 10/09/1945 DOA: 10/17/2023  PCP: Excell Seltzer, MD  Admit date: 10/17/2023 Discharge date: 10/22/2023  Admitted From: Home Disposition:  SNF  Recommendations for Outpatient Follow-up:  Follow up with PCP in 1-2 weeks Please obtain BMP/CBC in one week your next doctors visit.  Last day of Keflex 4/2 Recommend wearing compression stockings bilaterally to help with her autonomic dysfunction and occasional orthostasis  Discharge Condition: Stable CODE STATUS: Full Diet recommendation: Cardiac  Brief/Interim Summary: Brief Narrative:  78 year old with history of insulin-dependent diabetes, autoimmune cirrhosis, CHF, depression, CKD stage II comes to the hospital with orthostasis, fall at home.  Patient is found to have COVID-19 infection and urinary tract infection.  Trauma workup does not show any acute pathology.  Upon admission patient was asymptomatic from COVID-19 infection standpoint therefore receiving supportive treatment.  For UTI placed on IV Rocephin.  PT/OT for weakness.  During the hospitalization Trauma workup including CT head, cervical, thoracic spine, CAP negative for acute pathology.  Bilateral shoulder x-rays, right foot and right knee, right hip x-ray are all negative for acute fracture or dislocation.  Vitamin D and TSH is normal.  Occasionally positive orthostatics.  I suspect she suffers quite a bit from autonomic dysfunction due to advanced age and diabetes.  She has been educated on how to prevent falls secondary to autonomic dysfunction.  Compression stocking was ordered.  Echocardiogram was unremarkable with preserved EF.  PT/OT recommended SNF.  TOC currently working on placement.   Assessment & Plan:  Principal Problem:   Dizziness Active Problems:   Near syncope   Fall - Trauma workup including CT head, cervical, thoracic spine, CAP negative for acute pathology.  Bilateral shoulder x-rays, right  foot and right knee, right hip x-ray are all negative for acute fracture or dislocation.  Vitamin D and TSH is normal.  Occasionally positive orthostatics.  I suspect she suffers quite a bit from autonomic dysfunction due to advanced age and diabetes.  She has been educated on how to prevent falls secondary to autonomic dysfunction. - Compression stocking -PT/OT-SNF.  COVID-19 infection -No evidence of hypoxia.  Supportive care  Urinary tract infection -Urine cultures not sent, already on Rocephin > transition to Keflex.  EOT 4/2 -Previously has grown pansensitive Citrobacter and Proteus.  Insulin-dependent diabetes mellitus -On Semglee, sliding scale and Accu-Chek.  Will further adjust as necessary -A1c 7.9 -On outpatient insulin pump  History of congestive heart failure, preserved EF - Currently on Lasix - Echocardiogram this admission shows preserved EF grade 1 DD  CKD stage II History of renal cell carcinoma status post partial nephrectomy January 2024 - Crea stable at 1.0  History of autoimmune cirrhosis -On daily prednisone  Depression - Cymbalta  GERD - PPI  Lung nodules - Follow-up outpatient pulmonary and oncology  PT/OT-SNF.  Patient is quite motivated to get better   DVT prophylaxis: heparin injection 5,000 Units Start: 10/18/23 0930    Code Status: Full Code Family Communication: [name] up to date. SNF placement.  Awaiting SNF placement   Subjective: Feels well no complaints.  Examination:  General exam: Appears calm and comfortable  Respiratory system: Clear to auscultation. Respiratory effort normal. Cardiovascular system: S1 & S2 heard, RRR. No JVD, murmurs, rubs, gallops or clicks. No pedal edema. Gastrointestinal system: Abdomen is nondistended, soft and nontender. No organomegaly or masses felt. Normal bowel sounds heard. Central nervous system: Alert and oriented. No focal neurological deficits. Extremities: Symmetric 5 x 5 power. Skin:  No  rashes, lesions or ulcers Psychiatry: Judgement and insight appear normal. Mood & affect appropriate.    Discharge Diagnoses:  Principal Problem:   Dizziness Active Problems:   Near syncope      Discharge Exam: Vitals:   10/22/23 0251 10/22/23 0936  BP: 131/60 123/76  Pulse: 71 (!) 102  Resp: 18   Temp: 98.3 F (36.8 C) 99.1 F (37.3 C)  SpO2: 98% 99%   Vitals:   10/21/23 1426 10/21/23 2115 10/22/23 0251 10/22/23 0936  BP: (!) 142/60 (!) 141/93 131/60 123/76  Pulse: 71 74 71 (!) 102  Resp: 18 18 18    Temp: 98.6 F (37 C) 98.5 F (36.9 C) 98.3 F (36.8 C) 99.1 F (37.3 C)  TempSrc: Oral Oral  Oral  SpO2: 98% 97% 98% 99%  Weight:      Height:          Discharge Instructions   Allergies as of 10/22/2023       Reactions   Codeine Other (See Comments)   Unknown    Doxycycline Hives   Epinephrine Hcl Other (See Comments)   Had tremors when it was given.   Erythromycin Ethylsuccinate Other (See Comments)   Abdominal spasms   Eucalyptus Oil Other (See Comments)   Unknown    Iohexol Hives   Pt needs premedicated-- hives on prev contrast study per md office   Menthol Other (See Comments)   Unknown    Metformin Diarrhea   Oxycodone Other (See Comments)   "felt like I was dying"   Tape Rash        Medication List     TAKE these medications    albuterol 108 (90 Base) MCG/ACT inhaler Commonly known as: ProAir HFA Inhale 2 puffs into the lungs every 6 (six) hours as needed for wheezing or shortness of breath.   atenolol 25 MG tablet Commonly known as: TENORMIN TAKE 1 TABLET (25 MG TOTAL) BY MOUTH DAILY.   cephALEXin 500 MG capsule Commonly known as: KEFLEX Take 1 capsule (500 mg total) by mouth every 8 (eight) hours for 1 day.   cetirizine 10 MG tablet Commonly known as: ZYRTEC Take 10 mg by mouth daily.   Coenzyme Q10 100 MG Tabs Take 100 mg by mouth daily.   CRANBERRY PO Take 1,000 mg by mouth in the morning and at bedtime.   D-MANNOSE  PO Take 4,000 mg by mouth in the morning and at bedtime.   DULoxetine 60 MG capsule Commonly known as: Cymbalta Take 1 capsule (60 mg total) by mouth daily.   furosemide 40 MG tablet Commonly known as: LASIX Take 40 mg by mouth daily.   hydrOXYzine 25 MG tablet Commonly known as: ATARAX Take 1 tablet (25 mg total) by mouth 2 (two) times daily as needed for anxiety (irritability).   insulin lispro 100 UNIT/ML injection Commonly known as: HUMALOG Inject 50 Units into the skin daily. Per [employer]tr[employer] insulin pump   pantoprazole 40 MG tablet Commonly known as: PROTONIX TAKE 1 TABLET BY MOUTH EVERY DAY   polyethylene glycol 17 g packet Commonly known as: MIRALAX / GLYCOLAX Take 17 g by mouth daily as needed for mild constipation.   predniSONE 10 MG tablet Commonly known as: DELTASONE Take 10 mg by mouth daily with breakfast.   traMADol 50 MG tablet Commonly known as: ULTRAM Take 1 tablet (50 mg total) by mouth every 8 (eight) hours as needed for severe pain (pain score 7-10).   UNABLE TO FIND Take  1 tablet by mouth in the morning and at bedtime. Med Name: Cory Roughen Vitamin        Contact information for follow-up providers     Bedsole, Amy E, MD Follow up in 1 week(s).   Specialty: Family Medicine Contact information: [address] [place] [address] [phone]              Contact information for after-discharge care     Destination     [facility] Preferred SNF .   Service: Skilled Nursing Contact information: [address] [place] [address] [phone]                    Allergies  Allergen Reactions   Codeine Other (See Comments)    Unknown    Doxycycline Hives   Epinephrine Hcl Other (See Comments)    Had tremors when it was given.   Erythromycin Ethylsuccinate Other (See Comments)    Abdominal spasms   Eucalyptus Oil Other (See Comments)    Unknown    Iohexol  Hives    Pt needs premedicated-- hives on prev contrast study per md office    Menthol Other (See Comments)    Unknown    Metformin Diarrhea   Oxycodone Other (See Comments)    "felt like I was dying"   Tape Rash    You were cared for by a hospitalist during your hospital stay. If you have any questions about your discharge medications or the care you received while you were in the hospital after you are discharged, you can call the unit and asked to speak with the hospitalist on call if the hospitalist that took care of you is not available. Once you are discharged, your primary care physician will handle any further medical issues. Please note that no refills for any discharge medications will be authorized once you are discharged, as it is imperative that you return to your primary care physician (or establish a relationship with a primary care physician if you do not have one) for your aftercare needs so that they can reassess your need for medications and monitor your lab values.  You were cared for by a hospitalist during your hospital stay. If you have any questions about your discharge medications or the care you received while you were in the hospital after you are discharged, you can call the unit and asked to speak with the hospitalist on call if the hospitalist that took care of you is not available. Once you are discharged, your primary care physician will handle any further medical issues. Please note that NO REFILLS for any discharge medications will be authorized once you are discharged, as it is imperative that you return to your primary care physician (or establish a relationship with a primary care physician if you do not have one) for your aftercare needs so that they can reassess your need for medications and monitor your lab values.  Please request your Prim.MD to go over all Hospital Tests and Procedure/Radiological results at the follow up, please get all Hospital records sent  to your Prim MD by signing hospital release before you go home.  Get CBC, CMP, 2 view Chest X ray checked  by Primary MD during your next visit or SNF MD in 5-7 days ( we routinely change or add medications that can affect your baseline labs and fluid status, therefore we recommend that you get the mentioned basic workup next  visit with your PCP, your PCP may decide not to get them or add new tests based on their clinical decision)  On your next visit with your primary care physician please Get Medicines reviewed and adjusted.  If you experience worsening of your admission symptoms, develop shortness of breath, life threatening emergency, suicidal or homicidal thoughts you must seek medical attention immediately by calling 911 or calling your MD immediately  if symptoms less severe.  You Must read complete instructions/literature along with all the possible adverse reactions/side effects for all the Medicines you take and that have been prescribed to you. Take any new Medicines after you have completely understood and accpet all the possible adverse reactions/side effects.   Do not drive, operate heavy machinery, perform activities at heights, swimming or participation in water activities or provide baby sitting services if your were admitted for syncope or siezures until you have seen by Primary MD or a Neurologist and advised to do so again.  Do not drive when taking Pain medications.   Procedures/Studies: ECHOCARDIOGRAM COMPLETE Result Date: 10/20/2023    ECHOCARDIOGRAM REPORT   Patient Name:   [facility] Date of Exam: 10/20/2023 Medical Rec #:  [id number]     Height:       63.0 in Accession #:    [id number]    Weight:       187.6 lb Date of Birth:  1946-03-28      BSA:          1.882 m Patient Age:    77 years      BP:           130/81 mmHg Patient Gender: F             HR:           75 bpm. Exam Location:  Inpatient Procedure: 2D Echo, Cardiac Doppler and Color Doppler (Both Spectral and Color             Flow Doppler were utilized during procedure). Indications:    I42.9 Cardiomyopathy (unspecified)  History:        Patient has prior history of Echocardiogram examinations, most                 recent 07/16/2022. CHF, Abnormal ECG, Arrythmias:SVT,                 Signs/Symptoms:Dizziness/Lightheadedness, Syncope and Altered                 Mental Status; Risk Factors:Hypertension, Diabetes, Dyslipidemia                 and Sleep Apnea. Covid positive. Cancer.  Sonographer:    Sheralyn Boatman RDCS Referring Phys: [id number] Miguel Rota  Sonographer Comments: Technically difficult study due to poor echo windows and suboptimal parasternal window. Image acquisition challenging due to patient body habitus. Patient coughing througout exam. IMPRESSIONS  1. 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 There is no outflow obstruction. Left ventricular ejection fraction, by estimation, is 60 to 65%. The left ventricle has normal function. The left ventricle has no regional wall motion abnormalities. Left ventricular diastolic parameters are consistent with Grade I diastolic dysfunction (impaired relaxation).  2. Right ventricular systolic function is normal. The right ventricular size is normal.  3. The mitral valve is grossly normal. Trivial mitral valve regurgitation. No evidence of  mitral stenosis. Moderate mitral annular calcification.  4. The aortic valve was not well visualized. There is mild calcification of the aortic valve. Aortic valve regurgitation is not visualized. Aortic valve sclerosis/calcification is present, without any evidence of aortic stenosis.  5. The inferior vena cava is normal in size with greater than 50% respiratory variability, suggesting right atrial pressure of 3 mmHg. Comparison(s): Prior images reviewed side by side. The LV basal septal  mobile echodensity was not previously seen. Conclusion(s)/Recommendation(s): Consider TEE to clarify the left ventricular basal septum mobile echodensity, if the patient has a history of unexplained embolic events and would be a candidate for cardiac surgery. 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 There is no outflow obstruction. Left ventricular ejection fraction, by estimation, is 60 to 65%. The left ventricle has normal function. The left ventricle has no regional wall motion abnormalities. The left ventricular internal cavity size was normal in size. There is no left ventricular hypertrophy. Left ventricular diastolic parameters are consistent with Grade I diastolic dysfunction (impaired relaxation). Right Ventricle: The right ventricular size is normal. No increase in right ventricular wall thickness. Right ventricular systolic function is normal. Left Atrium: Left atrial size was normal in size. Right Atrium: Right atrial size was normal in size. Pericardium: There is no evidence of pericardial effusion. Mitral Valve: The mitral valve is grossly normal. There is moderate calcification of the mitral valve leaflet(s). Moderate mitral annular calcification. Trivial mitral valve regurgitation. No evidence of mitral valve stenosis. MV peak gradient, 6.1 mmHg.  The mean mitral valve gradient is 2.0 mmHg. Tricuspid Valve: The tricuspid valve is normal in structure. Tricuspid valve regurgitation is trivial. No evidence of tricuspid stenosis. Aortic Valve: The aortic valve was not well visualized. There is mild calcification of the aortic valve. Aortic valve regurgitation is not visualized. Aortic valve sclerosis/calcification is present, without any evidence of aortic stenosis. Pulmonic Valve: The pulmonic valve was not well  visualized. Pulmonic valve regurgitation is not visualized. No evidence of pulmonic stenosis. Aorta: The aortic root and ascending aorta are structurally normal, with no evidence of dilitation. Venous: The inferior vena cava is normal in size with greater than 50% respiratory variability, suggesting right atrial pressure of 3 mmHg. IAS/Shunts: No atrial level shunt detected by color flow Doppler.  LEFT VENTRICLE PLAX 2D LVIDd:         3.80 cm     Diastology LVIDs:         2.60 cm     LV e' medial:    6.09 cm/s LV PW:         0.90 cm     LV E/e' medial:  11.5 LV IVS:        0.90 cm     LV e' lateral:   6.53 cm/s LVOT diam:     2.20 cm     LV E/e' lateral: 10.7 LV SV:         73 LV SV Index:   39 LVOT Area:     3.80 cm  LV Volumes (MOD) LV vol d, MOD A2C: 54.6 ml LV vol d, MOD A4C: 55.4 ml LV vol s, MOD A2C: 17.4 ml LV vol s, MOD A4C: 18.5 ml LV SV MOD A2C:     37.2 ml LV SV MOD A4C:     55.4 ml LV SV MOD BP:  38.7 ml RIGHT VENTRICLE             IVC RV S prime:     13.50 cm/s  IVC diam: 1.30 cm TAPSE (M-mode): 2.0 cm LEFT ATRIUM             Index        RIGHT ATRIUM           Index LA diam:        3.60 cm 1.91 cm/m   RA Area:     14.90 cm LA Vol (A2C):   37.6 ml 19.98 ml/m  RA Volume:   36.90 ml  19.61 ml/m LA Vol (A4C):   22.4 ml 11.90 ml/m LA Biplane Vol: 29.4 ml 15.62 ml/m  AORTIC VALVE LVOT Vmax:   117.00 cm/s LVOT Vmean:  74.500 cm/s LVOT VTI:    0.193 m  AORTA Ao Root diam: 2.80 cm Ao Asc diam:  3.20 cm MITRAL VALVE MV Area (PHT): 2.84 cm     SHUNTS MV Area VTI:   2.49 cm     Systemic VTI:  0.19 m MV Peak grad:  6.1 mmHg     Systemic Diam: 2.20 cm MV Mean grad:  2.0 mmHg MV Vmax:       1.23 m/s MV Vmean:      63.2 cm/s MV Decel Time: 267 msec MV E velocity: 69.85 cm/s MV A velocity: 103.68 cm/s MV E/A ratio:  0.67 Mihai Croitoru MD Electronically signed by Thurmon Fair MD Signature Date/Time: 10/20/2023/12:53:51 PM    Final    DG Foot Complete Right Result Date: 10/18/2023 CLINICAL DATA:  Fall  EXAM: RIGHT FOOT COMPLETE - 3+ VIEW COMPARISON:  Right foot MRI 12/24/2014 FINDINGS: Screw fixation of the calcaneus. No acute fracture or dislocation. Soft tissues are unremarkable. IMPRESSION: No acute fracture or dislocation. Electronically Signed   By: Minerva Fester M.D.   On: 10/18/2023 02:18   DG Knee Complete 4 Views Right Result Date: 10/18/2023 CLINICAL DATA:  Fall EXAM: RIGHT KNEE - COMPLETE 4+ VIEW COMPARISON:  None Available. FINDINGS: No acute fracture or dislocation. No knee joint effusion. Vascular calcifications. IMPRESSION: Negative. Electronically Signed   By: Minerva Fester M.D.   On: 10/18/2023 02:16   DG HIP UNILAT WITH PELVIS 2-3 VIEWS RIGHT Result Date: 10/18/2023 CLINICAL DATA:  Fall EXAM: DG HIP (WITH OR WITHOUT PELVIS) 2-3V RIGHT COMPARISON:  Radiographs heel in [id number] FINDINGS: No acute fracture or dislocation. Degenerative changes pubic symphysis, both hips, SI joints and lower lumbar spine. IMPRESSION: No acute fracture or dislocation. Electronically Signed   By: Minerva Fester M.D.   On: 10/18/2023 02:14   DG Shoulder Right Result Date: 10/18/2023 CLINICAL DATA:  Fall EXAM: RIGHT SHOULDER - 2+ VIEW; LEFT SHOULDER - 2+ VIEW COMPARISON:  None. FINDINGS: No acute fracture or dislocation of the bilateral shoulders. Degenerative arthritis both AC joints. Chronic changes about the distal right clavicle. Linear calcification superior to the right humeral head likely due to calcific tendinopathy. IMPRESSION: No acute fracture or dislocation. Electronically Signed   By: Minerva Fester M.D.   On: 10/18/2023 02:13   DG Shoulder Left Result Date: 10/18/2023 CLINICAL DATA:  Fall EXAM: RIGHT SHOULDER - 2+ VIEW; LEFT SHOULDER - 2+ VIEW COMPARISON:  None. FINDINGS: No acute fracture or dislocation of the bilateral shoulders. Degenerative arthritis both AC joints. Chronic changes about the distal right clavicle. Linear calcification superior to the right humeral head likely due to  calcific tendinopathy. IMPRESSION: No acute fracture  or dislocation. Electronically Signed   By: Minerva Fester M.D.   On: 10/18/2023 02:13   CT CHEST ABDOMEN PELVIS WO CONTRAST Result Date: 10/18/2023 CLINICAL DATA:  Polytrauma, blunt.  Fall EXAM: CT CHEST, ABDOMEN AND PELVIS WITHOUT CONTRAST TECHNIQUE: Multidetector CT imaging of the chest, abdomen and pelvis was performed following the standard protocol without IV contrast. RADIATION DOSE REDUCTION: This exam was performed according to the departmental dose-optimization program which includes automated exposure control, adjustment of the mA and/or kV according to patient size and/or use of iterative reconstruction technique. COMPARISON:  06/30/2023 FINDINGS: CT CHEST FINDINGS Cardiovascular: No significant vascular findings. Normal heart size. No pericardial effusion. Mild atherosclerotic calcification within the thoracic aorta. Mediastinum/Nodes: No enlarged mediastinal, hilar, or axillary lymph nodes. Thyroid gland, trachea, and esophagus demonstrate no significant findings. Lungs/Pleura: Branching nodular density is again seen within the medial right lower lobe at axial image # 118/7 and sagittal image # 89/11 compatible with a impacted terminal. Associated bronchial wall thickening is present keeping with airway inflammation. There is stable mild bibasilar cylindrical bronchiectasis. Additional mucous plugging lateral segment of the left is also unchanged. Stable 4 mm subpleural pulmonary nodule within the left lower lobe, axial image # 63/7. Stable minimal parenchymal subpleural scarring within the right lung base. No focal pulmonary infiltrate. No pneumothorax or pleural effusion. No central obstructing lesion. Musculoskeletal: No acute bone abnormality. No lytic or blastic bone lesion. CT ABDOMEN PELVIS FINDINGS Hepatobiliary: Status post cholecystectomy. No intra or extrahepatic biliary ductal dilation. Nodular contour of the liver with relative  hypertrophy of the left hepatic lobe noted in keeping with changes of cirrhosis. Pancreas: Multiple stable cysts are seen within the pancreatic body and tail measuring up to 13 mm best seen on image # 64/3, stable since remote PET CT 09/26/2022 four views are not metabolically active. The pancreas is otherwise unremarkable. Spleen: Unremarkable Adrenals/Urinary Tract: Status post left radical nephrectomy. Residual right adrenal gland and right kidney unremarkable. Bladder is unremarkable. Stomach/Bowel: Moderate pancolonic diverticulosis. Stomach, small bowel, and large bowel are otherwise unremarkable. Appendix absent. No evidence of obstruction or focal inflammation. No free intraperitoneal gas or fluid. Vascular/Lymphatic: Aortic atherosclerosis. No enlarged abdominal or pelvic lymph nodes. Reproductive: Status post hysterectomy. No adnexal masses. Other: No abdominal wall hernia or abnormality. No abdominopelvic ascites. Musculoskeletal: Osseous structures are age-appropriate. No acute bone abnormality. No lytic or blastic bone lesion. IMPRESSION: 1. No acute intrathoracic or intra-abdominal injury. 2. Stable branching nodular density within the lower lobes bilaterally compatible with impacted terminal bronchi. Associated bronchial wall thickening is present keeping with airway inflammation. 3. Stable 4 mm subpleural pulmonary nodule within the left lower lobe. 4. Moderate pancolonic diverticulosis without superimposed acute inflammatory change. 5. Multiple stable cysts within the pancreatic body and tail measuring up to 13 mm, stable since remote PET CT 09/26/2022 and not metabolically active. Attention on follow-up imaging is recommended. 6. Status post left radical nephrectomy. 7. Nodular contour of the liver with relative hypertrophy of the left hepatic lobe noted in keeping with changes of cirrhosis. Aortic Atherosclerosis (ICD10-I70.0). Electronically Signed   By: Helyn Numbers M.D.   On: 10/18/2023 02:09    CT T-SPINE NO CHARGE Result Date: 10/18/2023 CLINICAL DATA:  Fall, blunt back trauma EXAM: CT THORACIC SPINE WITHOUT CONTRAST TECHNIQUE: Multidetector CT images of the thoracic were obtained using the standard protocol without intravenous contrast. RADIATION DOSE REDUCTION: This exam was performed according to the departmental dose-optimization program which includes automated exposure control, adjustment of the mA and/or  kV according to patient size and/or use of iterative reconstruction technique. COMPARISON:  None Available. FINDINGS: Alignment: Normal. Vertebrae: No acute fracture or focal pathologic process. Bridging disc osteophytes are seen throughout the mid and lower lumbar spine in keeping with changes of diffuse idiopathic skeletal hyperostosis. Paraspinal and other soft tissues: Negative. Disc levels: Intervertebral disc heights are preserved. No significant canal stenosis. 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 IMPRESSION: 1. No acute fracture or listhesis of the thoracic spine. 2. 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 Electronically Signed   By: Helyn Numbers M.D.   On: 10/18/2023 01:54   CT Head Wo Contrast Result Date: 10/18/2023 CLINICAL DATA:  Head and neck trauma.  Fall. EXAM: CT HEAD WITHOUT CONTRAST CT CERVICAL SPINE WITHOUT CONTRAST TECHNIQUE: Multidetector CT imaging of the head and cervical spine was performed following the standard protocol without intravenous contrast. Multiplanar CT image reconstructions of the cervical spine were also generated. RADIATION DOSE REDUCTION: This exam was performed according to the departmental dose-optimization program which includes automated exposure control, adjustment of the mA and/or kV according to patient size and/or use of iterative reconstruction technique. COMPARISON:  MRI head 08/10/2004 and MR cervical spine 12/28/2004 FINDINGS: CT HEAD FINDINGS  Brain: No intracranial hemorrhage, mass effect, or evidence of acute infarct. No hydrocephalus. No extra-axial fluid collection. Age related cerebral atrophy and chronic small vessel ischemic disease. Vascular: No hyperdense vessel. Intracranial arterial calcification. Skull: No fracture or focal lesion. Sinuses/Orbits: No acute finding. Other: None. CT CERVICAL SPINE FINDINGS Alignment: No evidence of traumatic malalignment. Skull base and vertebrae: No acute fracture. No primary bone lesion or focal pathologic process. Soft tissues and spinal canal: No prevertebral fluid or swelling. No visible canal hematoma. Disc levels: Multilevel spondylosis and facet arthropathy. No severe spinal canal narrowing. Upper chest: No acute abnormality. Other: Carotid calcification. IMPRESSION: 1. No acute intracranial abnormality. 2. No acute fracture in the cervical spine. Electronically Signed   By: Minerva Fester M.D.   On: 10/18/2023 01:44   CT Cervical Spine Wo Contrast Result Date: 10/18/2023 CLINICAL DATA:  Head and neck trauma.  Fall. EXAM: CT HEAD WITHOUT CONTRAST CT CERVICAL SPINE WITHOUT CONTRAST TECHNIQUE: Multidetector CT imaging of the head and cervical spine was performed following the standard protocol without intravenous contrast. Multiplanar CT image reconstructions of the cervical spine were also generated. RADIATION DOSE REDUCTION: This exam was performed according to the departmental dose-optimization program which includes automated exposure control, adjustment of the mA and/or kV according to patient size and/or use of iterative reconstruction technique. COMPARISON:  MRI head 08/10/2004 and MR cervical spine 12/28/2004 FINDINGS: CT HEAD FINDINGS Brain: No intracranial hemorrhage, mass effect, or evidence of acute infarct. No hydrocephalus. No extra-axial fluid collection. Age related cerebral atrophy and chronic small vessel ischemic disease. Vascular: No hyperdense vessel. Intracranial arterial  calcification. Skull: No fracture or focal lesion. Sinuses/Orbits: No acute finding. Other: None. CT CERVICAL SPINE FINDINGS Alignment: No evidence of traumatic malalignment. Skull base and vertebrae: No acute fracture. No primary bone lesion or focal pathologic process. Soft tissues and spinal canal: No prevertebral fluid or swelling. No visible canal hematoma. Disc levels: Multilevel spondylosis and facet arthropathy. No severe spinal canal narrowing. Upper chest: No acute abnormality. Other: Carotid calcification. IMPRESSION: 1. No acute intracranial abnormality. 2. No acute fracture in the cervical spine. Electronically Signed   By: Minerva Fester M.D.   On: 10/18/2023 01:44     The results of  significant diagnostics from this hospitalization (including imaging, microbiology, ancillary and laboratory) are listed below for reference.     Microbiology: Recent Results (from the past 240 hours)  SARS Coronavirus 2 by RT PCR (hospital order, performed in [facility] hospital lab) *cepheid single result test* Anterior Nasal Swab     Status: Abnormal   Collection Time: 10/18/23  6:12 AM   Specimen: Anterior Nasal Swab  Result Value Ref Range Status   SARS Coronavirus 2 by RT PCR POSITIVE (A) NEGATIVE Final    Comment: (NOTE) SARS-CoV-2 target nucleic acids are DETECTED  SARS-CoV-2 RNA is generally detectable in upper respiratory specimens  during the acute phase of infection.  Positive results are indicative  of the presence of the identified virus, but do not rule out bacterial infection or co-infection with other pathogens not detected by the test.  Clinical correlation with patient history and  other diagnostic information is necessary to determine patient infection status.  The expected result is negative.  Fact Sheet for Patients:   RoadLapTop.co.za   Fact Sheet for Healthcare Providers:   http://kim-miller.com/    This test is not yet  approved or cleared by the [place] FDA and  has been authorized for detection and/or diagnosis of SARS-CoV-2 by FDA under an Emergency Use Authorization (EUA).  This EUA will remain in effect (meaning this test can be used) for the duration of  the COVID-19 declaration under Section 564(b)(1)  of the Act, 21 U.S.C. section 360-bbb-3(b)(1), unless the authorization is terminated or revoked sooner.   Performed at [facility], [address] [place], [place] [address]      Labs: BNP (last 3 results) No results for input(s): "BNP" in the last 8760 hours. Basic Metabolic Panel: Recent Labs  Lab 10/18/23 0218 10/18/23 0300 10/19/23 0403 10/20/23 0335 10/21/23 0413 10/22/23 0412  NA 134*  --  134* 133* 137 140  K 3.5  --  3.8 3.7 3.9 3.8  CL 98  --  99 100 104 109  CO2 22  --  26 25 26 23   GLUCOSE 151*  --  331* 248* 154* 136*  BUN 27*  --  28* 29* 25* 19  CREATININE 1.23*  --  1.37* 1.08* 1.06* 0.88  CALCIUM 9.3  --  9.2 9.0 9.1 9.3  MG  --  2.1  --  1.7 1.8 1.7  PHOS  --   --   --  3.1  --   --    Liver Function Tests: Recent Labs  Lab 10/18/23 0218  AST 43*  ALT 43  ALKPHOS 128*  BILITOT 1.4*  PROT 6.4*  ALBUMIN 3.4*   No results for input(s): "LIPASE", "AMYLASE" in the last 168 hours. No results for input(s): "AMMONIA" in the last 168 hours. CBC: Recent Labs  Lab 10/18/23 0218 10/19/23 0403 10/20/23 0335 10/21/23 0413 10/22/23 0412  WBC 7.3 4.7 4.0 4.1 4.1  NEUTROABS 5.4  --   --   --   --   HGB 13.5 12.7 11.4* 11.3* 11.4*  HCT 40.5 39.2 34.7* 34.5* 34.8*  MCV 90.2 94.2 91.8 92.5 93.5  PLT 140* 119* 107* 112* 120*   Cardiac Enzymes: No results for input(s): "CKTOTAL", "CKMB", "CKMBINDEX", "TROPONINI" in the last 168 hours. BNP: Invalid input(s): "POCBNP" CBG: Recent Labs  Lab 10/21/23 0730 10/21/23 1146 10/21/23 1649 10/21/23 2145 10/22/23 0823  GLUCAP 102* 232* 385* 114* 91   D-Dimer No results for input(s):  "DDIMER" in the last  72 hours. Hgb A1c No results for input(s): "HGBA1C" in the last 72 hours. Lipid Profile No results for input(s): "CHOL", "HDL", "LDLCALC", "TRIG", "CHOLHDL", "LDLDIRECT" in the last 72 hours. Thyroid function studies Recent Labs    10/19/23 1148  TSH 1.053   Anemia work up No results for input(s): "VITAMINB12", "FOLATE", "FERRITIN", "TIBC", "IRON", "RETICCTPCT" in the last 72 hours. Urinalysis    Component Value Date/Time   COLORURINE AMBER (A) 10/18/2023 0350   APPEARANCEUR HAZY (A) 10/18/2023 0350   LABSPEC 1.017 10/18/2023 0350   PHURINE 5.0 10/18/2023 0350   GLUCOSEU >=500 (A) 10/18/2023 0350   HGBUR NEGATIVE 10/18/2023 0350   HGBUR moderate 03/28/2010 0931   BILIRUBINUR NEGATIVE 10/18/2023 0350   BILIRUBINUR Negative 09/21/2021 1614   KETONESUR NEGATIVE 10/18/2023 0350   PROTEINUR NEGATIVE 10/18/2023 0350   UROBILINOGEN 0.2 09/21/2021 1614   UROBILINOGEN 0.2 03/28/2010 0931   NITRITE NEGATIVE 10/18/2023 0350   LEUKOCYTESUR MODERATE (A) 10/18/2023 0350   Sepsis Labs Recent Labs  Lab 10/19/23 0403 10/20/23 0335 10/21/23 0413 10/22/23 0412  WBC 4.7 4.0 4.1 4.1   Microbiology Recent Results (from the past 240 hours)  SARS Coronavirus 2 by RT PCR (hospital order, performed in [facility] hospital lab) *cepheid single result test* Anterior Nasal Swab     Status: Abnormal   Collection Time: 10/18/23  6:12 AM   Specimen: Anterior Nasal Swab  Result Value Ref Range Status   SARS Coronavirus 2 by RT PCR POSITIVE (A) NEGATIVE Final    Comment: (NOTE) SARS-CoV-2 target nucleic acids are DETECTED  SARS-CoV-2 RNA is generally detectable in upper respiratory specimens  during the acute phase of infection.  Positive results are indicative  of the presence of the identified virus, but do not rule out bacterial infection or co-infection with other pathogens not detected by the test.  Clinical correlation with patient history and  other diagnostic  information is necessary to determine patient infection status.  The expected result is negative.  Fact Sheet for Patients:   RoadLapTop.co.za   Fact Sheet for Healthcare Providers:   http://kim-miller.com/    This test is not yet approved or cleared by the [place] FDA and  has been authorized for detection and/or diagnosis of SARS-CoV-2 by FDA under an Emergency Use Authorization (EUA).  This EUA will remain in effect (meaning this test can be used) for the duration of  the COVID-19 declaration under Section 564(b)(1)  of the Act, 21 U.S.C. section 360-bbb-3(b)(1), unless the authorization is terminated or revoked sooner.   Performed at [facility], [address] [place], [place] [address]      Time coordinating discharge:  I have spent 35 minutes face to face with the patient and on the ward discussing the patients care, assessment, plan and disposition with other care givers. >50% of the time was devoted counseling the patient about the risks and benefits of treatment/Discharge disposition and coordinating care.   SIGNED:   Miguel Rota, MD  [facility] 10/22/2023, 11:06 AM   If 7PM-7AM, please contact night-coverage

## 2023-10-22 NOTE — Plan of Care (Signed)

## 2023-10-23 ENCOUNTER — Other Ambulatory Visit: Payer: Self-pay | Admitting: Student

## 2023-10-23 ENCOUNTER — Inpatient Hospital Stay (HOSPITAL_COMMUNITY)

## 2023-10-23 DIAGNOSIS — I4891 Unspecified atrial fibrillation: Secondary | ICD-10-CM

## 2023-10-23 DIAGNOSIS — I5032 Chronic diastolic (congestive) heart failure: Secondary | ICD-10-CM

## 2023-10-23 DIAGNOSIS — U071 COVID-19: Secondary | ICD-10-CM

## 2023-10-23 DIAGNOSIS — I480 Paroxysmal atrial fibrillation: Secondary | ICD-10-CM

## 2023-10-23 DIAGNOSIS — R42 Dizziness and giddiness: Secondary | ICD-10-CM | POA: Diagnosis not present

## 2023-10-23 DIAGNOSIS — I951 Orthostatic hypotension: Secondary | ICD-10-CM | POA: Diagnosis not present

## 2023-10-23 LAB — CBC
HCT: 36.7 % (ref 36.0–46.0)
Hemoglobin: 11.5 g/dL — ABNORMAL LOW (ref 12.0–15.0)
MCH: 30.3 pg (ref 26.0–34.0)
MCHC: 31.3 g/dL (ref 30.0–36.0)
MCV: 96.8 fL (ref 80.0–100.0)
Platelets: 133 10*3/uL — ABNORMAL LOW (ref 150–400)
RBC: 3.79 MIL/uL — ABNORMAL LOW (ref 3.87–5.11)
RDW: 13.7 % (ref 11.5–15.5)
WBC: 5 10*3/uL (ref 4.0–10.5)
nRBC: 0 % (ref 0.0–0.2)

## 2023-10-23 LAB — BASIC METABOLIC PANEL WITH GFR
Anion gap: 6 (ref 5–15)
BUN: 18 mg/dL (ref 8–23)
CO2: 22 mmol/L (ref 22–32)
Calcium: 9.2 mg/dL (ref 8.9–10.3)
Chloride: 108 mmol/L (ref 98–111)
Creatinine, Ser: 1.02 mg/dL — ABNORMAL HIGH (ref 0.44–1.00)
GFR, Estimated: 57 mL/min — ABNORMAL LOW (ref >60)
Glucose, Bld: 89 mg/dL (ref 70–99)
Potassium: 4 mmol/L (ref 3.5–5.1)
Sodium: 136 mmol/L (ref 135–145)

## 2023-10-23 LAB — GLUCOSE, CAPILLARY
Glucose-Capillary: 76 mg/dL (ref 70–99)
Glucose-Capillary: 208 mg/dL — ABNORMAL HIGH (ref 70–99)
Glucose-Capillary: 286 mg/dL — ABNORMAL HIGH (ref 70–99)
Glucose-Capillary: 266 mg/dL — ABNORMAL HIGH (ref 70–99)

## 2023-10-23 LAB — MAGNESIUM: Magnesium: 1.8 mg/dL (ref 1.7–2.4)

## 2023-10-23 MED ORDER — DIAZEPAM 5 MG PO TABS
5.0000 mg | ORAL_TABLET | Freq: Once | ORAL | Status: AC
  Administered 2023-10-23: 5 mg via ORAL
  Filled 2023-10-23: qty 1

## 2023-10-23 NOTE — TOC Progression Note (Addendum)
 Transition of Care [facility]) - Progression Note    Patient Details  Name: [name] [name] MRN: [id number] Date of Birth: 02/05/1946  [facility] of [facility] CM/SW Contact  Larrie Kass, LCSW Phone Number: 10/23/2023, 8:59 AM  Clinical Narrative:     Insurance Berkley Harvey is pending. TOC to follow.   ADDEN 10:00pm  [facility] called and offered a Peer to Peer for SNF with Dr Logan Bores by Leonor Liv today , MD made aware. Pt was approved for EMS transport auth [id number]. TOC to follow.   Expected Discharge Plan: Skilled Nursing Facility Barriers to Discharge: Insurance Authorization  Expected Discharge Plan and Services       Living arrangements for the past 2 months: Single Family Home                                       Social Determinants of Health (SDOH) Interventions SDOH Screenings   Food Insecurity: No Food Insecurity (10/18/2023)  Housing: Low Risk  (10/18/2023)  Transportation Needs: No Transportation Needs (10/18/2023)  Utilities: Not At Risk (10/18/2023)  Depression (PHQ2-9): High Risk (07/11/2023)  Financial Resource Strain: Low Risk  (01/14/2022)  Physical Activity: Inactive (01/14/2022)  Social Connections: Moderately Integrated (10/18/2023)  Stress: No Stress Concern Present (01/14/2022)  Tobacco Use: Low Risk  (10/18/2023)    Readmission Risk Interventions     No data to display

## 2023-10-23 NOTE — Plan of Care (Signed)

## 2023-10-23 NOTE — Consult Note (Addendum)
 Cardiology Consultation   Patient ID: Winna Golla MRN: [id number]; DOB: 09-22-1945  Admit date: 10/17/2023 Date of Consult: 10/23/2023  PCP:  Excell Seltzer, MD   [facility] Providers Cardiologist:  Nanetta Batty, MD        Patient Profile:   Paulett Kaufhold is a 78 y.o. female with a history of chronic diastolic CHF, palpitations with PACs/ PVCs noted on remote monitor in 2013, autoimmune hepatitis with cirrhosis, type 2 diabetes mellitus, CKD stage IIIa, obstructive sleep apnea, IBS, spinal stenosis, and  renal cell carcinoma s/p left nephrectomy in 07/2022 who is being seen 10/23/2023 for the evaluation of mobile echodensity noted on Echo at the request of Dr. Benjamine Mola.  History of Present Illness:   Ms. Butrum is a 78 year old female with the above history who is followed by Dr. Allyson Sabal. Patient is primarily followed by Cardiology for chronic diastolic CHF and palpitations. Remote monitor in 2013 reportedly showed PACs/ PVCs. Myoview in 12/2018 was low risk with no evidence of ischemia. Last Echo in 06/2022 showed LVEF of 60-65% with no regional wall motion abnormalities and grade 1 diastolic dysfunction. She has not been seen in our office since 06/2022.   Patient presented to the [facility] ED on 10/17/2023 after a fall. She reported dizziness over the past week and stated her family was getting over COVID. She was found to be orthostatic and also tested positive for COVID. Work-up was also significant for UTI. Home antihypertensives were held and she was treated with antibiotics. 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 Cardiology consulted for further evaluation.  Patient is a difficult historian. She states she has had a lot of memory issues over the last year or so, especially after her laparoscopic nephrectomy and  appendectomy in 07/2022.  She also states she has not done well since the surgeries and describes a lot of weakness/deconditioning.  She states she has been having dizziness with standing for about the past 3 to 4 months and has had multiple falls over that time period.  She cannot tell me how often she falls because she only really remembers the "bad ones"  but she estimates that she falls at least every other day.  She also reports multiple syncopal episodes associated with this again cannot tell me how many she has had.  These episodes sound orthostatic in nature.  They occur after she states goes from sitting to standing and are preceded by dizziness/lightheadedness and some mild vision changes.  She denies being unconscious for a long period.  She states she has had some serious falls and has hit her head before.  She also reports some atypical chest pain that she describes as a "cramping" sensation.  She has had this for a while.  These episodes only last for a couple seconds at a time and then resolve on their own.  She states she has a history of esophageal spasms and this feels similar to that.  He also reports minute dyspnea for the last several months to a year.  It is again difficult to get a lot of details about this given her memory issues but she reports some dyspnea both at rest and with exertion.  She has chronic orthopnea and sleeps in about a 40 degree angle.  Has chronic lower extremity edema which is stable.  She does have  a history of palpitations she describes as a fluttering sensation.  She has these episodes on almost a daily basis but they do not last long.  She occasionally has some associated shortness of breath with these episodes. .  She has Atenolol listed under her home medication list.  However, she states this was stopped during her prior admission due to heart rate. Per discharge summary on 06/09/2023, it does look like Atenolol was stopped due to bradycardia and soft BP.  She does  think her episodes of palpitations have been worse since stopping this.  Per review of the chart, there RN note from 10/18/2023 which mentions atrial fibrillation.  Reviewed saved telemetry strips under the media tab and it does look like she had at least a couple hours of atrial fibrillation with rates in the 120s to 130s.  She is currently not on telemetry but sounds like she is in sinus rhythm on exam.     Past Medical History:  Diagnosis Date   Allergic rhinitis, cause unspecified    Anxiety    Arthritis    hands, feet and back   Asthma    Benign neoplasm of colon    Bursitis    hips   CHF (congestive heart failure) (HCC)    Cirrhosis (HCC)    auto immune   Complication of anesthesia    " i WAKE UP WITH TREMORS " after shoulder surgery tremors for 10 months after surgery surgery- 2009- surgical center in [place]   Cyst and pseudocyst of pancreas    Depressive disorder, not elsewhere classified    Diabetes mellitus without complication (HCC)    Diverticulitis    Diverticulosis of colon (without mention of hemorrhage)    Dyspnea    Dysrhythmia    arrhythmia   Esophageal reflux    Essential and other specified forms of tremor    History of GI bleed    History of kidney stones    Irritable bowel syndrome    Obesity, unspecified    Other specified cardiac dysrhythmias(427.89)    Periapical abscess without sinus    Polyneuropathy in diabetes(357.2)    PONV (postoperative nausea and vomiting)    Pure hypercholesterolemia    Renal cell carcinoma (HCC)    Sleep apnea    CPAP- non compliant   Type II or unspecified type diabetes mellitus with neurological manifestations, not stated as uncontrolled(250.60)     Past Surgical History:  Procedure Laterality Date   ABDOMINAL HYSTERECTOMY     CALCANEAL OSTEOTOMY Right 07/27/2015   Procedure: RIGHT CALCANEAL OSTEOTOMY ;  Surgeon: Toni Arthurs, MD;  Location: [facility];  Service: Orthopedics;  Laterality: Right;    CHOLECYSTECTOMY     15-20 years ago   COLONOSCOPY WITH PROPOFOL N/A 08/17/2019   Procedure: COLONOSCOPY WITH PROPOFOL;  Surgeon: Jeani Hawking, MD;  Location: [facility] ENDOSCOPY;  Service: Endoscopy;  Laterality: N/A;   COLONOSCOPY WITH PROPOFOL N/A 06/21/2022   Procedure: COLONOSCOPY WITH PROPOFOL;  Surgeon: Jeani Hawking, MD;  Location: [facility] ENDOSCOPY;  Service: Gastroenterology;  Laterality: N/A;   ESOPHAGOGASTRODUODENOSCOPY (EGD) WITH PROPOFOL N/A 08/17/2019   Procedure: ESOPHAGOGASTRODUODENOSCOPY (EGD) WITH PROPOFOL;  Surgeon: Jeani Hawking, MD;  [facility] END[facility]COPY;  Service: Endoscopy;  Laterality: N/A;   GASTROCNEMIUS RECESSION Right 07/27/2015   Procedure: RIGHT GASTROC RECESSION;  Surgeon: Toni Arthurs, MD;  Location: [facility];  Service: Orthopedics;  Laterality: Right;   KIDNEY STONE SURGERY  1980's   removal   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   PARTIAL HYSTERECTOMY  1990's   POLYPECTOMY  08/17/2019   Procedure: POLYPECTOMY;  Surgeon: Jeani Hawking, MD;  Location: [facility] ENDOSCOPY;  Service: Endoscopy;;   POLYPECTOMY  06/21/2022   Procedure: POLYPECTOMY;  Surgeon: Jeani Hawking, MD;  Location: [facility] ENDOSCOPY;  Service: Gastroenterology;;   ROBOT ASSISTED LAPAROSCOPIC NEPHRECTOMY Left 08/14/2022   Procedure: XI ROBOTIC ASSISTED LAPAROSCOPIC NEPHRECTOMY;  Surgeon: Rene Paci, MD;  Location: [facility] ORS;  Service: Urology;  Laterality: Left;   ROTATOR CUFF REPAIR  2005   XI ROBOTIC LAPAROSCOPIC ASSISTED APPENDECTOMY N/A 08/14/2022   Procedure: XI ROBOTIC LAPAROSCOPIC ASSISTED APPENDECTOMY;  Surgeon: Romie Levee, MD;  Location: [facility] ORS;  Service: General;  Laterality: N/A;     Home Medications:  Prior to Admission medications   Medication Sig Start Date End Date Taking? Authorizing Provider  albuterol (PROAIR HFA) 108 (90 Base) MCG/ACT inhaler Inhale 2 puffs into the lungs every 6 (six) hours as needed for wheezing or shortness of breath. 06/13/23  Yes Bedsole, Amy E, MD   atenolol (TENORMIN) 25 MG tablet TAKE 1 TABLET (25 MG TOTAL) BY MOUTH DAILY. 10/10/23  Yes Runell Gess, MD  cetirizine (ZYRTEC) 10 MG tablet Take 10 mg by mouth daily.   Yes [provider]  Coenzyme Q10 100 MG TABS Take 100 mg by mouth daily.   Yes [provider]  CRANBERRY PO Take 1,000 mg by mouth in the morning and at bedtime.   Yes [provider]  D-MANNOSE PO Take 4,000 mg by mouth in the morning and at bedtime.   Yes [provider]  DULoxetine (CYMBALTA) 60 MG capsule Take 1 capsule (60 mg total) by mouth daily. 07/11/23  Yes Bedsole, Amy E, MD  furosemide (LASIX) 40 MG tablet Take 40 mg by mouth daily.   Yes [provider]  hydrOXYzine (ATARAX) 25 MG tablet Take 1 tablet (25 mg total) by mouth 2 (two) times daily as needed for anxiety (irritability). 06/13/23  Yes Bedsole, Amy E, MD  insulin lispro (HUMALOG) 100 UNIT/ML injection Inject 50 Units into the skin daily. Per Medtronic insulin pump   Yes [provider]  pantoprazole (PROTONIX) 40 MG tablet TAKE 1 TABLET BY MOUTH EVERY DAY 09/17/19  Yes Wendling, Jilda Roche, DO  polyethylene glycol (MIRALAX / GLYCOLAX) 17 g packet Take 17 g by mouth daily as needed for mild constipation. 06/09/23  Yes Hongalgi, Maximino Greenland, MD  predniSONE (DELTASONE) 10 MG tablet Take 10 mg by mouth daily with breakfast.   Yes [provider]  UNABLE TO FIND Take 1 tablet by mouth in the morning and at bedtime. Med Name: Cory Roughen Vitamin   Yes [provider]  traMADol (ULTRAM) 50 MG tablet Take 1 tablet (50 mg total) by mouth every 8 (eight) hours as needed for severe pain (pain score 7-10). 10/21/23   Miguel Rota, MD    Inpatient Medications: Scheduled Meds:  DULoxetine  60 mg Oral Daily   heparin  5,000 Units Subcutaneous Q8H   insulin aspart  0-15 Units Subcutaneous TID WC   insulin aspart  0-5 Units Subcutaneous QHS   insulin aspart  2 Units Subcutaneous TID WC   insulin  glargine-yfgn  14 Units Subcutaneous BID   loratadine  10 mg Oral Daily   pantoprazole  40 mg Oral Daily   predniSONE  10 mg Oral Q breakfast   Continuous Infusions:  PRN Meds: acetaminophen **OR** acetaminophen, albuterol, guaiFENesin, hydrALAZINE, hydrOXYzine, ondansetron **OR** ondansetron (  ZOFRAN) IV, polyethylene glycol, senna-docusate, traMADol  Allergies:    Allergies  Allergen Reactions   Codeine Other (See Comments)    Unknown    Doxycycline Hives   Epinephrine Hcl Other (See Comments)    Had tremors when it was given.   Erythromycin Ethylsuccinate Other (See Comments)    Abdominal spasms   Eucalyptus Oil Other (See Comments)    Unknown    Iohexol Hives    Pt needs premedicated-- hives on prev contrast study per md office    Menthol Other (See Comments)    Unknown    Metformin Diarrhea   Oxycodone Other (See Comments)    "felt like I was dying"   Tape Rash    Social History:   Social History   Socioeconomic History   Marital status: Widowed    Spouse name: Not on file   Number of children: 5   Years of education: Not on file   Highest education level: Not on file  Occupational History    Employer: RETIRED  Tobacco Use   Smoking status: Never   Smokeless tobacco: Never  Vaping Use   Vaping status: Never Used  Substance and Sexual Activity   Alcohol use: No    Alcohol/week: 0.0 standard drinks of alcohol   Drug use: No   Sexual activity: Not on file  Other Topics Concern   Not on file  Social History Narrative   Husband treated for testicular cancer, 2 cups coffee a day, no exercise   Social Drivers of Health   Financial Resource Strain: Low Risk  (01/14/2022)   Overall Financial Resource Strain (CARDIA)    Difficulty of Paying Living Expenses: Not very hard  Food Insecurity: No Food Insecurity (10/18/2023)   Hunger Vital Sign    Worried About Running Out of Food in the Last Year: Never true    Ran Out of Food in the Last Year: Never true   Transportation Needs: No Transportation Needs (10/18/2023)   PRAPARE - Administrator, Civil Service (Medical): No    Lack of Transportation (Non-Medical): No  Physical Activity: Inactive (01/14/2022)   Exercise Vital Sign    Days of Exercise per Week: 0 days    Minutes of Exercise per Session: 0 min  Stress: No Stress Concern Present (01/14/2022)   [employer] of Occupational Health - Occupational Stress Questionnaire    Feeling of Stress : Only a little  Social Connections: Moderately Integrated (10/18/2023)   Social Connection and Isolation Panel [NHANES]    Frequency of Communication with Friends and Family: Three times a week    Frequency of Social Gatherings with Friends and Family: Three times a week    Attends Religious Services: More than 4 times per year    Active Member of Clubs or Organizations: Yes    Attends Banker Meetings: More than 4 times per year    Marital Status: Widowed  Intimate Partner Violence: Not At Risk (10/18/2023)   Humiliation, Afraid, Rape, and Kick questionnaire    Fear of Current or Ex-Partner: No    Emotionally Abused: No    Physically Abused: No    Sexually Abused: No    Family History:   Family History  Problem Relation Age of Onset   Clotting disorder Mother    Cirrhosis Father    Diabetes Other        2 aunts   Cancer Other        grandmother   Allergies  Other        "whole family"   Asthma Other        "whole family"     ROS:  Please see the history of present illness.   Physical Exam/Data:   Vitals:   10/22/23 1239 10/22/23 1956 10/23/23 0522 10/23/23 1350  BP: 132/66 (!) 153/67 (!) 164/81 (!) 160/62  Pulse: 72 74 68 71  Resp: 16 18 18 16   Temp: 98.8 F (37.1 C) 98.3 F (36.8 C) 98.1 F (36.7 C) 98.7 F (37.1 C)  TempSrc: Oral Oral Oral Oral  SpO2: 98% 97% 100% 100%  Weight:      Height:        Intake/Output Summary (Last 24 hours) at 10/23/2023 1719 Last data filed at 10/23/2023  1400 Gross per 24 hour  Intake 600 ml  Output --  Net 600 ml      10/18/2023    6:00 AM 08/29/2023   12:14 PM 07/11/2023    4:17 PM  Last 3 Weights  Weight (lbs) 187 lb 9.8 oz 201 lb 14.4 oz 197 lb 4 oz  Weight (kg) 85.1 kg 91.581 kg 89.472 kg     Body mass index is 33.23 kg/m.  General: 78 y.o. obese Caucasian female resting comfortably in no acute distress. HEENT: Normocephalic and atraumatic. Sclera clear. EOMs intact. Neck: Supple. No carotid bruits. No JVD. Heart: RRR. Distinct S1 and S2. No murmurs, gallops, or rubs. Radial and distal pedal pulses 2+ and equal bilaterally. Lungs: No increased work of breathing. Clear to ausculation bilaterally. No wheezes, rhonchi, or rales.  Abdomen: Soft, non-distended, and non-tender to palpation. Bowel sounds present in all 4 quadrants.  MSK: Normal strength and tone for age. Extremities: No clubbing, cyanosis, or edema.    Skin: Warm and dry. Neuro: Alert and oriented x3. No focal deficits. Psych: Normal affect. Responds appropriately.   EKG:  The EKG was personally reviewed and demonstrates:  Initial EKG on 10/18/2023 showed normal sinus rhythm, rate 93 bpm, with no acute ischemic changes but prolonged QTc of . Repeat EKG showe normal sinus rhythm, rate 99 bpm, with PACs and improvement in QTc to 479 ms. Telemetry:  Telemetry was personally reviewed and demonstrates:  Not currently on telemetry.  Relevant CV Studies:  Echocardiogram 10/20/2023: Impressions:  1. 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 There is no outflow  obstruction. Left ventricular  ejection fraction, by estimation, is 60 to 65%. The left ventricle has  normal function. The left ventricle has no regional wall motion  abnormalities. Left ventricular diastolic parameters are consistent with  Grade I  diastolic dysfunction (impaired  relaxation).   2. Right ventricular systolic function is normal. The right ventricular  size is normal.   3. The mitral valve is grossly normal. Trivial mitral valve  regurgitation. No evidence of mitral stenosis. Moderate mitral annular  calcification.   4. The aortic valve was not well visualized. There is mild calcification  of the aortic valve. Aortic valve regurgitation is not visualized. Aortic  valve sclerosis/calcification is present, without any evidence of aortic  stenosis.   5. The inferior vena cava is normal in size with greater than 50%  respiratory variability, suggesting right atrial pressure of 3 mmHg.    Laboratory Data:  High Sensitivity Troponin:  No results for input(s): "TROPONINIHS" in the last 720 hours.  Chemistry Recent Labs  Lab 10/21/23 0413 10/22/23 0412 10/23/23 0408  NA 137 140 136  K 3.9 3.8 4.0  CL 104 109 108  CO2 26 23 22   GLUCOSE 154* 136* 89  BUN 25* 19 18  CREATININE 1.06* 0.88 1.02*  CALCIUM 9.1 9.3 9.2  MG 1.8 1.7 1.8  GFRNONAA 54* >60 57*  ANIONGAP 7 8 6     Recent Labs  Lab 10/18/23 0218  PROT 6.4*  ALBUMIN 3.4*  AST 43*  ALT 43  ALKPHOS 128*  BILITOT 1.4*   Lipids No results for input(s): "CHOL", "TRIG", "HDL", "LABVLDL", "LDLCALC", "CHOLHDL" in the last 168 hours.  Hematology Recent Labs  Lab 10/21/23 0413 10/22/23 0412 10/23/23 0408  WBC 4.1 4.1 5.0  RBC 3.73* 3.72* 3.79*  HGB 11.3* 11.4* 11.5*  HCT 34.5* 34.8* 36.7  MCV 92.5 93.5 96.8  MCH 30.3 30.6 30.3  MCHC 32.8 32.8 31.3  RDW 13.4 13.5 13.7  PLT 112* 120* 133*   Thyroid  Recent Labs  Lab 10/19/23 1148  TSH 1.053    BNPNo results for input(s): "BNP", "PROBNP" in the last 168 hours.  DDimer No results for input(s): "DDIMER" in the last 168 hours.   Radiology/Studies:  MR BRAIN WO CONTRAST Result Date: 10/23/2023 CLINICAL DATA:  78 year old female with altered mental status and frequent falls. EXAM: MRI HEAD  WITHOUT CONTRAST TECHNIQUE: Multiplanar, multiecho pulse sequences of the brain and surrounding structures were obtained without intravenous contrast. COMPARISON:  Head CT 10/18/2023.  Brain MRI 08/10/2004. FINDINGS: Brain: Cerebral volume is within normal limits for age. No restricted diffusion to suggest acute infarction. No midline shift, mass effect, evidence of mass lesion, ventriculomegaly, extra-axial collection or acute intracranial hemorrhage. Cervicomedullary junction and pituitary are within normal limits. Scattered mild for age subcortical and other patchy white matter T2 and FLAIR hyperintensity. No cortical encephalomalacia. No chronic cerebral blood products. Subtle chronic lacunar infarct in the left thalamus series 12, image 88. Otherwise deep gray nuclei appear normal. Mild patchy T2 hyperintensity in the pons. Cerebellum within normal limits. Vascular: Major intracranial vascular flow voids are preserved. Skull and upper cervical spine: Negative for age visible cervical spine. Visualized bone marrow signal is within normal limits. Sinuses/Orbits: Negative orbits. Paranasal sinuses are well aerated. Other: Mastoids are well aerated. Grossly normal visible internal auditory structures. Negative visible scalp and face. IMPRESSION: 1. No acute intracranial abnormality. 2. Mild for age signal changes in the brain compatible with chronic small vessel disease. Electronically Signed   By: Odessa Fleming M.D.   On: 10/23/2023 12:05   ECHOCARDIOGRAM COMPLETE Result Date: 10/20/2023    ECHOCARDIOGRAM REPORT   Patient Name:   [facility] Date of Exam: 10/20/2023 Medical Rec #:  [id number]     Height:       63.0 in Accession #:    [id number]    Weight:       187.6 lb Date of Birth:  1945-12-28      BSA:          1.882 m Patient Age:    77 years      BP:           130/81 mmHg Patient Gender: F             HR:           75 bpm. Exam Location:  Inpatient Procedure: 2D Echo, Cardiac Doppler and Color Doppler (Both  Spectral and Color  Flow Doppler were utilized during procedure). Indications:    I42.9 Cardiomyopathy (unspecified)  History:        Patient has prior history of Echocardiogram examinations, most                 recent 07/16/2022. CHF, Abnormal ECG, Arrythmias:SVT,                 Signs/Symptoms:Dizziness/Lightheadedness, Syncope and Altered                 Mental Status; Risk Factors:Hypertension, Diabetes, Dyslipidemia                 and Sleep Apnea. Covid positive. Cancer.  Sonographer:    Sheralyn Boatman RDCS Referring Phys: [id number] Miguel Rota  Sonographer Comments: Technically difficult study due to poor echo windows and suboptimal parasternal window. Image acquisition challenging due to patient body habitus. Patient coughing througout exam. IMPRESSIONS  1. 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 There is no outflow obstruction. Left ventricular ejection fraction, by estimation, is 60 to 65%. The left ventricle has normal function. The left ventricle has no regional wall motion abnormalities. Left ventricular diastolic parameters are consistent with Grade I diastolic dysfunction (impaired relaxation).  2. Right ventricular systolic function is normal. The right ventricular size is normal.  3. The mitral valve is grossly normal. Trivial mitral valve regurgitation. No evidence of mitral stenosis. Moderate mitral annular calcification.  4. The aortic valve was not well visualized. There is mild calcification of the aortic valve. Aortic valve regurgitation is not visualized. Aortic valve sclerosis/calcification is present, without any evidence of aortic stenosis.  5. The inferior vena cava is normal in size with greater than 50% respiratory variability, suggesting right atrial pressure of 3 mmHg. Comparison(s): Prior images reviewed side by side. The  LV basal septal mobile echodensity was not previously seen. Conclusion(s)/Recommendation(s): Consider TEE to clarify the left ventricular basal septum mobile echodensity, if the patient has a history of unexplained embolic events and would be a candidate for cardiac surgery. 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 There is no outflow obstruction. Left ventricular ejection fraction, by estimation, is 60 to 65%. The left ventricle has normal function. The left ventricle has no regional wall motion abnormalities. The left ventricular internal cavity size was normal in size. There is no left ventricular hypertrophy. Left ventricular diastolic parameters are consistent with Grade I diastolic dysfunction (impaired relaxation). Right Ventricle: The right ventricular size is normal. No increase in right ventricular wall thickness. Right ventricular systolic function is normal. Left Atrium: Left atrial size was normal in size. Right Atrium: Right atrial size was normal in size. Pericardium: There is no evidence of pericardial effusion. Mitral Valve: The mitral valve is grossly normal. There is moderate calcification of the mitral valve leaflet(s). Moderate mitral annular calcification. Trivial mitral valve regurgitation. No evidence of mitral valve stenosis. MV peak gradient, 6.1 mmHg.  The mean mitral valve gradient is 2.0 mmHg. Tricuspid Valve: The tricuspid valve is normal in structure. Tricuspid valve regurgitation is trivial. No evidence of tricuspid stenosis. Aortic Valve: The aortic valve was not well visualized. There is mild calcification of the aortic valve. Aortic valve regurgitation is not visualized. Aortic valve  sclerosis/calcification is present, without any evidence of aortic stenosis. Pulmonic Valve: The pulmonic valve was not  well visualized. Pulmonic valve regurgitation is not visualized. No evidence of pulmonic stenosis. Aorta: The aortic root and ascending aorta are structurally normal, with no evidence of dilitation. Venous: The inferior vena cava is normal in size with greater than 50% respiratory variability, suggesting right atrial pressure of 3 mmHg. IAS/Shunts: No atrial level shunt detected by color flow Doppler.  LEFT VENTRICLE PLAX 2D LVIDd:         3.80 cm     Diastology LVIDs:         2.60 cm     LV e' medial:    6.09 cm/s LV PW:         0.90 cm     LV E/e' medial:  11.5 LV IVS:        0.90 cm     LV e' lateral:   6.53 cm/s LVOT diam:     2.20 cm     LV E/e' lateral: 10.7 LV SV:         73 LV SV Index:   39 LVOT Area:     3.80 cm  LV Volumes (MOD) LV vol d, MOD A2C: 54.6 ml LV vol d, MOD A4C: 55.4 ml LV vol s, MOD A2C: 17.4 ml LV vol s, MOD A4C: 18.5 ml LV SV MOD A2C:     37.2 ml LV SV MOD A4C:     55.4 ml LV SV MOD BP:      38.7 ml RIGHT VENTRICLE             IVC RV S prime:     13.50 cm/s  IVC diam: 1.30 cm TAPSE (M-mode): 2.0 cm LEFT ATRIUM             Index        RIGHT ATRIUM           Index LA diam:        3.60 cm 1.91 cm/m   RA Area:     14.90 cm LA Vol (A2C):   37.6 ml 19.98 ml/m  RA Volume:   36.90 ml  19.61 ml/m LA Vol (A4C):   22.4 ml 11.90 ml/m LA Biplane Vol: 29.4 ml 15.62 ml/m  AORTIC VALVE LVOT Vmax:   117.00 cm/s LVOT Vmean:  74.500 cm/s LVOT VTI:    0.193 m  AORTA Ao Root diam: 2.80 cm Ao Asc diam:  3.20 cm MITRAL VALVE MV Area (PHT): 2.84 cm     SHUNTS MV Area VTI:   2.49 cm     Systemic VTI:  0.19 m MV Peak grad:  6.1 mmHg     Systemic Diam: 2.20 cm MV Mean grad:  2.0 mmHg MV Vmax:       1.23 m/s MV Vmean:      63.2 cm/s MV Decel Time: 267 msec MV E velocity: 69.85 cm/s MV A velocity: 103.68 cm/s MV E/A ratio:  0.67 Mihai Croitoru MD Electronically signed by Thurmon Fair MD Signature Date/Time: 10/20/2023/12:53:51 PM    Final      Assessment and Plan:   Mobile 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 Echo during admission showed normal LV function with a possible mobile 2x67mm echodensity attached to the endocardial surface of the left ventricular basal septum which may represent a papillary fibroelastoma. Patient has never had a stroke. The literature  is clear that in patient who has had a stroke, surgical excision is generally recommended. However, the literature is not as clear in patients who have not had a stroke - medical therapy with antiplatelet or anticoagulation can be considered. She was also noted to have a couple of hours of atrial fibrillation on telemetry on 10/18/2023 which would be another indication for anticoagulation. However, patient has had progressive weakness over the last year. She has orthostatic hypotension and reports frequent falls and syncopal episodes. She also reports a prior significant GI bleed due to diverticulitis. Therefore, I think bleed risk I higher than stroke risk at this point, especially from a papillary fibroelastoma standpoint (there is more of an indication to start Eliquis from an atrial fibrillation standpoint than a papillary fibroelastoma standpoint. Recommend holding off on antiplatelet/ anticoagulation therapy at this time and continuing to monitor.   New Onset Atrial Fibrillation Patient has a history of palpitations with prior PACs/ PVCs noted on monitor. However, she was noted to have a couple of hours of atrial fibrillation with rates in the 120s to 130s on telemetry on 10/18/2023. This is a new diagnosis. Echo shows normal LV function. - Currently not on telemetry which seems to be maintaining sinus rhythm on exam.  - She was previously on Atenolol but it looks like this was stopped during an admission in 05/2023 due to mild bradycardia and soft BP. Would not resume due to orthostatic hypotension. - CHA2DS2-VASc = 7 (aortic  atherosclerosis, CHF, HTN, DM, age x2, female) indicating an 11.2% annual risk of stroke. However, she also reports frequent falls and a significant GI bleed in the past due to diverticulitis. HADS-BLED score = 4 (HTN, liver disease, prior major bleeding, age >30) indicating she is high risk of major bleeding. Discussed this with patient. Given she only had a couple of hours of atrial fibrillation noted during this hospitalization, I would favor holding off on anticoagulation at this time and getting a 30 Day Event Monitor to assess atrial fibrillation. If monitor shows a significant atrial fibrillation, can rediscuss starting anticoagulation. Will discuss with MD.  Orthostatic Hypotension Syncope Falls Patient reports dizziness when standing for the last 3-4 months. She has had multiple falls and syncopal episodes associated this. Episodes sound like orthostatic hypotension. Orthostatic vitals signs were positive on admission. Echo as above. - She is now hypertensive. Last positive orthostatic readings are from 3/31.  - Continue to hold home Lasix. Can use this as needed if she starts to retain fluid. - Continue to hold Atenolol.  - Will continue to treat conservatively - recommended staying well hydrated, changing positions slowly, and compression stockings. - Syncopal episodes sound like they are secondary to orthostatic hypotension. Do not thinks they are arrhythmogenic in nature but we are planning an outpatient 30 Day Event Monitor regardless to help assess atrial fibrillation burden.  Chronic Diastolic CHF Echo this admission showed  LVEF of 60-65% with grade 1 diastolic dysfunction. - Euvolemic on exam. - Continue to hold home Lasix given orthostatic hypotension. Can likely use only as needed at discharge for weight gain or worsening lower extremity edema.  Atypical Chest Pain Patient describes very atypical chest cramping. She has a history of esophageal spasms in the past and states it  feels similar to this. EKG shows no acute ischemic changes. Echo shows normal LV function and wall motion.  - Symptoms do not sound like cardiac chest pain. No ischemic work-up planned at this time.  Otherwise, per primary team: -  Autoimmune hepatitis/ cirrhosis - Fall - COVID-19 infection - UTI - Type 2 diabetes mellitus - CKD stage IIIa - GERD  - Lung nodules   Risk Assessment/Risk Scores:     CHA2DS2-VASc Score = 7  This indicates a 11.2% annual risk of stroke. The patient's score is based upon: CHF History: 1 HTN History: 1 Diabetes History: 1 Stroke History: 0 Vascular Disease History: 1 Age Score: 2 Gender Score: 1     For questions or updates, please contact [facility] Please consult [email or web address] for contact info under    Signed, Smitty Knudsen  10/23/2023 5:19 PM  Patient seen and examined with Marjie Skiff PA-C.  Agree as above, with the following exceptions and changes as noted below.  78 year old female presents with syncope and falls, dizziness over the past week, tested positive for COVID with sick family contacts.  Also has UTI.  Cardiology primarily consulted for mobile echodensity noted on echocardiogram on the basal left ventricular septum.  She feels well overall at this time.  On chart review it appears she may have briefly had some atrial fibrillation or atrial flutter early in her hospital presentation, strips reviewed gen: NAD, CV: RRR, no murmurs, Lungs: clear, Abd: soft, Extrem: Warm, well perfused, puffy edema, Neuro/Psych: alert and oriented x 3, normal mood and affect. All available labs, radiology testing, previous records reviewed.  We discussed in detail the brief episode of atrial fibrillation and the papillary fibroblastoma.  For atrial fibrillation we would usually recommend anticoagulation based on her CHA2DS2-VASc score, however the patient has significant orthostasis and frequent falls.  With this in mind we may want to  take a more conservative approach to initiating anticoagulation and screen for further episodes of atrial fibrillation with 30-day monitor.  If recurrence of atrial fibrillation we will have a discussion and shared decision making about using anticoagulation.  The small mobile echodensity noted on the basal ventricular septum certainly may represent a papillary fibroelastoma, it appears subcentimeter, and brain MRI was negative for stroke.  In the absence of embolic phenomena, would consider anticoagulation for stroke guidelines or antiplatelet therapy based on cardiovascular literature, however again given falls, we will reassess for atrial fibrillation recurrence, and then justify use of anticoagulation for both A-fib as well as the small likely papillary fibroelastoma.  Given that it is subcentimeter without embolic phenomenon, no strong indication for surgery consultation at this time, particularly given that the patient is not an optimal surgical candidate given family reported history of dementia, autoimmune hepatitis with cirrhosis, diabetes, OSA, and prior renal cell carcinoma.  Parke Poisson, MD 10/23/23 8:26 PM

## 2023-10-23 NOTE — Progress Notes (Signed)
 Ordered 30 day Event Monitor to help assess atrial fibrillation burden. Please see consult note from today for more information.   Corrin Parker, PA-C 10/23/2023 8:17 PM

## 2023-10-23 NOTE — Inpatient Diabetes Management (Signed)
 Inpatient Diabetes Program Recommendations  AACE/ADA: New Consensus Statement on Inpatient Glycemic Control (2015)  Target Ranges:  Prepandial:   less than 140 mg/dL      Peak postprandial:   less than 180 mg/dL (1-2 hours)      Critically ill patients:  140 - 180 mg/dL   Lab Results  Component Value Date   GLUCAP 76 10/23/2023   HGBA1C 7.9 (H) 10/19/2023    Inpatient Diabetes Program Recommendations:    Might consider increasing meal coverage:  Novolog 4 units TID with meals if she consumes at least 50%.  Will continue to follow while inpatient.  Thank you, Dulce Sellar, MSN, CDCES Diabetes Coordinator Inpatient Diabetes Program [phone] (team pager from 8a-5p)

## 2023-10-23 NOTE — Progress Notes (Addendum)
 PROGRESS NOTE    [name] [name]  GEX:[id number] DOB: Oct 13, 1945 DOA: 10/17/2023 PCP: Excell Seltzer, MD    Brief Narrative:   78 year old with history of insulin-dependent diabetes, autoimmune cirrhosis, CHF, depression, CKD stage II comes to the hospital with orthostasis, fall at home.  Patient is found to have COVID-19 infection and urinary tract infection.  Trauma workup does not show any acute pathology.  Upon admission patient was asymptomatic from COVID-19 infection standpoint therefore receiving supportive treatment.  For UTI placed on IV Rocephin.  PT/OT for weakness.  During the hospitalization Trauma workup including CT head, cervical, thoracic spine, CAP negative for acute pathology.  Bilateral shoulder x-rays, right foot and right knee, right hip x-ray are all negative for acute fracture or dislocation.  Vitamin D and TSH is normal.  Occasionally positive orthostatics.  I suspect she suffers quite a bit from autonomic dysfunction due to advanced age and diabetes.  She has been educated on how to prevent falls secondary to autonomic dysfunction.  Compression stocking was ordered.  Echocardiogram was unremarkable with preserved EF.  PT/OT recommended SNF.  TOC currently working on placement.   Assessment & Plan:  Principal Problem:   Dizziness Active Problems:   Near syncope   Mobile echo density -seen on TTE 3/31 -will ask cardiology to consult -check MRI  Fall - Trauma workup including CT head, cervical, thoracic spine, CAP negative for acute pathology.  Bilateral shoulder x-rays, right foot and right knee, right hip x-ray are all negative for acute fracture or dislocation.  Vitamin D and TSH is normal.  Occasionally positive orthostatics.  I suspect she suffers quite a bit from autonomic dysfunction due to advanced age and diabetes.  She has been educated on how to prevent falls secondary to autonomic dysfunction. - Compression stocking -PT/OT-SNF.  COVID-19 infection -No  evidence of hypoxia.  Supportive care  Urinary tract infection -Urine cultures not sent, already on Rocephin > transition to Keflex.  EOT 4/2 -Previously has grown pansensitive Citrobacter and Proteus.  Insulin-dependent diabetes mellitus -On Semglee, sliding scale and Accu-Chek.  Will further adjust as necessary -A1c 7.9 -On outpatient insulin pump  History of congestive heart failure, preserved EF - Currently on Lasix - echo-- see above  CKD stage II History of renal cell carcinoma status post partial nephrectomy January 2024 - Crea stable at 1.0  History of autoimmune cirrhosis -On daily prednisone  Depression - Cymbalta  GERD - PPI  Lung nodules - Follow-up outpatient pulmonary and oncology  PT/OT-SNF.  Patient is quite motivated to get better   DVT prophylaxis: heparin injection 5,000 Units Start: 10/18/23 0930    Code Status: Full Code Family Communication: called daughter SNF placement.  Awaiting SNF placement   Subjective: C/o headache today Family reports periodic episodes of confusion at home over the last 2 months    Examination:   General: Appearance:    Obese female in no acute distress     Lungs:      respirations unlabored  Heart:    Normal heart rate.   MS:   All extremities are intact.   Neurologic:   Awake, alert          Data Reviewed: I have personally reviewed following labs and imaging studies  CBC: Recent Labs  Lab 10/18/23 0218 10/19/23 0403 10/20/23 0335 10/21/23 0413 10/22/23 0412 10/23/23 0408  WBC 7.3 4.7 4.0 4.1 4.1 5.0  NEUTROABS 5.4  --   --   --   --   --  HGB 13.5 12.7 11.4* 11.3* 11.4* 11.5*  HCT 40.5 39.2 34.7* 34.5* 34.8* 36.7  MCV 90.2 94.2 91.8 92.5 93.5 96.8  PLT 140* 119* 107* 112* 120* 133*   Basic Metabolic Panel: Recent Labs  Lab 10/18/23 0300 10/19/23 0403 10/20/23 0335 10/21/23 0413 10/22/23 0412 10/23/23 0408  NA  --  134* 133* 137 140 136  K  --  3.8 3.7 3.9 3.8 4.0  CL  --  99  100 104 109 108  CO2  --  26 25 26 23 22   GLUCOSE  --  331* 248* 154* 136* 89  BUN  --  28* 29* 25* 19 18  CREATININE  --  1.37* 1.08* 1.06* 0.88 1.02*  CALCIUM  --  9.2 9.0 9.1 9.3 9.2  MG 2.1  --  1.7 1.8 1.7 1.8  PHOS  --   --  3.1  --   --   --    GFR: Estimated Creatinine Clearance: 47.8 mL/min (A) (by C-G formula based on SCr of 1.02 mg/dL (H)). Liver Function Tests: Recent Labs  Lab 10/18/23 0218  AST 43*  ALT 43  ALKPHOS 128*  BILITOT 1.4*  PROT 6.4*  ALBUMIN 3.4*   No results for input(s): "LIPASE", "AMYLASE" in the last 168 hours. No results for input(s): "AMMONIA" in the last 168 hours. Coagulation Profile: No results for input(s): "INR", "PROTIME" in the last 168 hours. Cardiac Enzymes: No results for input(s): "CKTOTAL", "CKMB", "CKMBINDEX", "TROPONINI" in the last 168 hours. BNP (last 3 results) No results for input(s): "PROBNP" in the last 8760 hours. HbA1C: No results for input(s): "HGBA1C" in the last 72 hours. CBG: Recent Labs  Lab 10/22/23 0823 10/22/23 1141 10/22/23 1647 10/22/23 2111 10/23/23 0738  GLUCAP 91 265* 333* 132* 76   Lipid Profile: No results for input(s): "CHOL", "HDL", "LDLCALC", "TRIG", "CHOLHDL", "LDLDIRECT" in the last 72 hours. Thyroid Function Tests: No results for input(s): "TSH", "T4TOTAL", "FREET4", "T3FREE", "THYROIDAB" in the last 72 hours. Anemia Panel: No results for input(s): "VITAMINB12", "FOLATE", "FERRITIN", "TIBC", "IRON", "RETICCTPCT" in the last 72 hours. Sepsis Labs: No results for input(s): "PROCALCITON", "LATICACIDVEN" in the last 168 hours.  Recent Results (from the past 240 hours)  SARS Coronavirus 2 by RT PCR (hospital order, performed in [facility] hospital lab) *cepheid single result test* Anterior Nasal Swab     Status: Abnormal   Collection Time: 10/18/23  6:12 AM   Specimen: Anterior Nasal Swab  Result Value Ref Range Status   SARS Coronavirus 2 by RT PCR POSITIVE (A) NEGATIVE Final    Comment:  (NOTE) SARS-CoV-2 target nucleic acids are DETECTED  SARS-CoV-2 RNA is generally detectable in upper respiratory specimens  during the acute phase of infection.  Positive results are indicative  of the presence of the identified virus, but do not rule out bacterial infection or co-infection with other pathogens not detected by the test.  Clinical correlation with patient history and  other diagnostic information is necessary to determine patient infection status.  The expected result is negative.  Fact Sheet for Patients:   RoadLapTop.co.za   Fact Sheet for Healthcare Providers:   http://kim-miller.com/    This test is not yet approved or cleared by the [place] FDA and  has been authorized for detection and/or diagnosis of SARS-CoV-2 by FDA under an Emergency Use Authorization (EUA).  This EUA will remain in effect (meaning this test can be used) for the duration of  the COVID-19 declaration under Section 564(b)(1)  of the Act, 21 U.S.C. section 360-bbb-3(b)(1), unless the authorization is terminated or revoked sooner.   Performed at [facility], [address], [place], [place] [address]          Radiology Studies: No results found.      Scheduled Meds:  DULoxetine  60 mg Oral Daily   heparin  5,000 Units Subcutaneous Q8H   insulin aspart  0-15 Units Subcutaneous TID WC   insulin aspart  0-5 Units Subcutaneous QHS   insulin aspart  2 Units Subcutaneous TID WC   insulin glargine-yfgn  14 Units Subcutaneous BID   loratadine  10 mg Oral Daily   pantoprazole  40 mg Oral Daily   predniSONE  10 mg Oral Q breakfast   Continuous Infusions:   LOS: 4 days    Time spent: 45 minutes spent on chart review, discussion with nursing staff, consultants, updating family and interview/physical exam; more than 50% of that time was spent in counseling and/or coordination of care.    Joseph Art,  DO [facility] Available via Epic secure chat 7am-7pm After these hours, please refer to coverage provider listed on [email or web address] 10/23/2023, 10:19 AM

## 2023-10-24 DIAGNOSIS — F0390 Unspecified dementia without behavioral disturbance: Secondary | ICD-10-CM | POA: Diagnosis not present

## 2023-10-24 DIAGNOSIS — I951 Orthostatic hypotension: Secondary | ICD-10-CM | POA: Diagnosis not present

## 2023-10-24 DIAGNOSIS — R55 Syncope and collapse: Secondary | ICD-10-CM | POA: Diagnosis not present

## 2023-10-24 DIAGNOSIS — N390 Urinary tract infection, site not specified: Secondary | ICD-10-CM | POA: Diagnosis not present

## 2023-10-24 DIAGNOSIS — G9089 Other disorders of autonomic nervous system: Secondary | ICD-10-CM | POA: Diagnosis not present

## 2023-10-24 DIAGNOSIS — E1122 Type 2 diabetes mellitus with diabetic chronic kidney disease: Secondary | ICD-10-CM | POA: Diagnosis not present

## 2023-10-24 DIAGNOSIS — N3020 Other chronic cystitis without hematuria: Secondary | ICD-10-CM | POA: Diagnosis not present

## 2023-10-24 DIAGNOSIS — R296 Repeated falls: Secondary | ICD-10-CM | POA: Diagnosis not present

## 2023-10-24 DIAGNOSIS — Z905 Acquired absence of kidney: Secondary | ICD-10-CM | POA: Diagnosis not present

## 2023-10-24 DIAGNOSIS — D649 Anemia, unspecified: Secondary | ICD-10-CM | POA: Diagnosis not present

## 2023-10-24 DIAGNOSIS — I480 Paroxysmal atrial fibrillation: Secondary | ICD-10-CM | POA: Diagnosis not present

## 2023-10-24 DIAGNOSIS — C642 Malignant neoplasm of left kidney, except renal pelvis: Secondary | ICD-10-CM | POA: Diagnosis not present

## 2023-10-24 DIAGNOSIS — F419 Anxiety disorder, unspecified: Secondary | ICD-10-CM | POA: Diagnosis not present

## 2023-10-24 DIAGNOSIS — U071 COVID-19: Secondary | ICD-10-CM | POA: Diagnosis not present

## 2023-10-24 DIAGNOSIS — R2689 Other abnormalities of gait and mobility: Secondary | ICD-10-CM | POA: Diagnosis not present

## 2023-10-24 DIAGNOSIS — E1142 Type 2 diabetes mellitus with diabetic polyneuropathy: Secondary | ICD-10-CM | POA: Diagnosis not present

## 2023-10-24 DIAGNOSIS — R3915 Urgency of urination: Secondary | ICD-10-CM | POA: Diagnosis not present

## 2023-10-24 DIAGNOSIS — M6281 Muscle weakness (generalized): Secondary | ICD-10-CM | POA: Diagnosis not present

## 2023-10-24 DIAGNOSIS — I5030 Unspecified diastolic (congestive) heart failure: Secondary | ICD-10-CM | POA: Diagnosis not present

## 2023-10-24 DIAGNOSIS — R42 Dizziness and giddiness: Secondary | ICD-10-CM | POA: Diagnosis not present

## 2023-10-24 DIAGNOSIS — I5032 Chronic diastolic (congestive) heart failure: Secondary | ICD-10-CM | POA: Diagnosis not present

## 2023-10-24 DIAGNOSIS — I4891 Unspecified atrial fibrillation: Secondary | ICD-10-CM | POA: Diagnosis not present

## 2023-10-24 DIAGNOSIS — K759 Inflammatory liver disease, unspecified: Secondary | ICD-10-CM | POA: Diagnosis not present

## 2023-10-24 DIAGNOSIS — F331 Major depressive disorder, recurrent, moderate: Secondary | ICD-10-CM | POA: Diagnosis not present

## 2023-10-24 DIAGNOSIS — M6259 Muscle wasting and atrophy, not elsewhere classified, multiple sites: Secondary | ICD-10-CM | POA: Diagnosis not present

## 2023-10-24 LAB — CBC
HCT: 35.5 % — ABNORMAL LOW (ref 36.0–46.0)
Hemoglobin: 11.5 g/dL — ABNORMAL LOW (ref 12.0–15.0)
MCH: 30.3 pg (ref 26.0–34.0)
MCHC: 32.4 g/dL (ref 30.0–36.0)
MCV: 93.7 fL (ref 80.0–100.0)
Platelets: 143 10*3/uL — ABNORMAL LOW (ref 150–400)
RBC: 3.79 MIL/uL — ABNORMAL LOW (ref 3.87–5.11)
RDW: 13.6 % (ref 11.5–15.5)
WBC: 5.2 10*3/uL (ref 4.0–10.5)
nRBC: 0 % (ref 0.0–0.2)

## 2023-10-24 LAB — GLUCOSE, CAPILLARY
Glucose-Capillary: 112 mg/dL — ABNORMAL HIGH (ref 70–99)
Glucose-Capillary: 234 mg/dL — ABNORMAL HIGH (ref 70–99)
Glucose-Capillary: 423 mg/dL — ABNORMAL HIGH (ref 70–99)
Glucose-Capillary: 316 mg/dL — ABNORMAL HIGH (ref 70–99)

## 2023-10-24 LAB — MAGNESIUM: Magnesium: 1.8 mg/dL (ref 1.7–2.4)

## 2023-10-24 LAB — BASIC METABOLIC PANEL WITH GFR
Anion gap: 7 (ref 5–15)
BUN: 25 mg/dL — ABNORMAL HIGH (ref 8–23)
CO2: 26 mmol/L (ref 22–32)
Calcium: 9.5 mg/dL (ref 8.9–10.3)
Chloride: 105 mmol/L (ref 98–111)
Creatinine, Ser: 0.94 mg/dL (ref 0.44–1.00)
GFR, Estimated: >60 mL/min (ref >60)
Glucose, Bld: 162 mg/dL — ABNORMAL HIGH (ref 70–99)
Potassium: 4.5 mmol/L (ref 3.5–5.1)
Sodium: 138 mmol/L (ref 135–145)

## 2023-10-24 MED ORDER — INSULIN ASPART 100 UNIT/ML IJ SOLN: 4.0000 [IU] | Freq: Three times a day (TID) | INTRAMUSCULAR | Status: DC

## 2023-10-24 MED ORDER — INSULIN ASPART 100 UNIT/ML IJ SOLN
0.0000 [IU] | Freq: Three times a day (TID) | INTRAMUSCULAR | Status: DC
Start: 2023-10-24 — End: 2023-11-18

## 2023-10-24 MED ORDER — INSULIN ASPART 100 UNIT/ML IJ SOLN: 0.0000 [IU] | Freq: Every day | INTRAMUSCULAR | Status: DC

## 2023-10-24 MED ORDER — INSULIN GLARGINE-YFGN 100 UNIT/ML ~~LOC~~ SOLN: 14.0000 [IU] | Freq: Two times a day (BID) | SUBCUTANEOUS | Status: DC

## 2023-10-24 NOTE — Inpatient Diabetes Management (Signed)
 Inpatient Diabetes Program Recommendations  AACE/ADA: New Consensus Statement on Inpatient Glycemic Control (2015)  Target Ranges:  Prepandial:   less than 140 mg/dL      Peak postprandial:   less than 180 mg/dL (1-2 hours)      Critically ill patients:  140 - 180 mg/dL   Lab Results  Component Value Date   GLUCAP 112 (H) 10/24/2023   HGBA1C 7.9 (H) 10/19/2023    Review of Glycemic Control  Latest Reference Range & Units 10/23/23 07:38 10/23/23 11:53 10/23/23 16:26 10/23/23 21:05 10/24/23 07:51  Glucose-Capillary 70 - 99 mg/dL 76 161 (H) 096 (H) 045 (H) 112 (H)  (H): Data is abnormally high  Inpatient Diabetes Program Recommendations:     Might consider increasing meal coverage:   Novolog 4 units TID with meals if she consumes at least 50%.   Will continue to follow while inpatient.   Thank you, Dulce Sellar, MSN, CDCES Diabetes Coordinator Inpatient Diabetes Program [phone] (team pager from 8a-5p)

## 2023-10-24 NOTE — Plan of Care (Signed)

## 2023-10-24 NOTE — Care Management Important Message (Signed)
 Important Message  Patient Details IM Letter given. Name: [name] [name] MRN: [id number] Date of Birth: 1946-04-26   Important Message Given:  Yes - Medicare IM     Caren Macadam 10/24/2023, 11:11 AM

## 2023-10-24 NOTE — Telephone Encounter (Signed)
 Spoke with patient's daughter [name]. Verified name and DOB. Patient was consulted at hospital by Dr. Jacques Navy and Marjie Skiff PA on 10/24/2023. Patient has no further requests or concerns. Pt has follow up appt with Dr. Allyson Sabal on November 18, 2023 at 3:30.   Josie LPN

## 2023-10-24 NOTE — Progress Notes (Signed)
 [facility] arrived at 1945 to transport [name] to [facility].  [name] is alert and oriented x 4.  Vitals stable.  Blood sugar: 318.  Covered with Novolog per HS scale.  Denies pain and discomfort.

## 2023-10-24 NOTE — TOC Progression Note (Signed)
 Transition of Care [facility]) - Progression Note    Patient Details  Name: [name] [name] MRN: [id number] Date of Birth: [birth year]  [facility] CM/SW Contact  Larrie Kass, LCSW Phone Number: 10/24/2023, 9:56 AM  Clinical Narrative:     CSW received a call from [facility] stating that the pt's insurance authorization was denied after a Peer-to-Peer. CSW informed the MD of the denial. MD called the Electrical engineer back and was instructed to resubmit the insurance authorization.  CSW resubmitted the insurance authorization. Pt authorization for SNF is pending at this time. TOC to follow.    Expected Discharge Plan: Skilled Nursing Facility Barriers to Discharge: Insurance Authorization  Expected Discharge Plan and Services       Living arrangements for the past 2 months: Single Family Home Expected Discharge Date: 10/23/23                                     Social Determinants of Health (SDOH) Interventions SDOH Screenings   Food Insecurity: No Food Insecurity (10/18/2023)  Housing: Low Risk  (10/18/2023)  Transportation Needs: No Transportation Needs (10/18/2023)  Utilities: Not At Risk (10/18/2023)  Depression (PHQ2-9): High Risk (07/11/2023)  Financial Resource Strain: Low Risk  (01/14/2022)  Physical Activity: Inactive (01/14/2022)  Social Connections: Moderately Integrated (10/18/2023)  Stress: No Stress Concern Present (01/14/2022)  Tobacco Use: Low Risk  (10/18/2023)    Readmission Risk Interventions     No data to display

## 2023-10-24 NOTE — Progress Notes (Signed)
   No new recommendations from a cardiac standpoint today. Outpatient event monitor has been ordered and I will arrange a follow-up visit. Please see consult note from 10/23/2023 for full details. Cardiology will sign off.  Corrin Parker, PA-C 10/24/2023 12:00 PM

## 2023-10-24 NOTE — Discharge Planning (Signed)
 Report called to primary nurse at [facility].

## 2023-10-24 NOTE — Discharge Instructions (Signed)
  You have a follow-up appointment with a provider at [facility] in [place].  We are currently in the process of transitioning from two locations to one.  Effective November 17, 2023, all appointments that were previously scheduled at either our [facility] or [place] locations will be moved to our new location at [address] [place], [place], [address].  The phone number for our new location will be [phone].

## 2023-10-24 NOTE — TOC Transition Note (Signed)
 Transition of Care [facility]) - Discharge Note   Patient Details  Name: [name] [name] MRN: [id number] Date of Birth: 1946-06-25  [facility]) CM/SW Contact:  Larrie Kass, LCSW Phone Number: 10/24/2023, 1:32 PM   Clinical Narrative:    Pt's insurance Berkley Harvey was approved for SNF placement for an initial 7 days , auth ID [id number]. Pt to d/c to [facility], room assignment 1004B, RN to call report to [phone]. CSW spoke with pt's daughter [name] who agrees with d/c plan. [facility] called , no further TOC needs, TOC sign off.    Final next level of care: Skilled Nursing Facility Barriers to Discharge: No Barriers Identified   Patient Goals and CMS Choice Patient states their goals for this hospitalization and ongoing recovery are:: SNF to get stonger          Discharge Placement              Patient chooses bed at: [facility] Patient to be transferred to facility by: EMS Name of family member notified: Lowanda Foster (Daughter)  [phone] (Mobile) Patient and family notified of of transfer: 10/24/23  Discharge Plan and Services Additional resources added to the After Visit Summary for                                       Social Drivers of Health (SDOH) Interventions SDOH Screenings   Food Insecurity: No Food Insecurity (10/18/2023)  Housing: Low Risk  (10/18/2023)  Transportation Needs: No Transportation Needs (10/18/2023)  Utilities: Not At Risk (10/18/2023)  Depression (PHQ2-9): High Risk (07/11/2023)  Financial Resource Strain: Low Risk  (01/14/2022)  Physical Activity: Inactive (01/14/2022)  Social Connections: Moderately Integrated (10/18/2023)  Stress: No Stress Concern Present (01/14/2022)  Tobacco Use: Low Risk  (10/18/2023)     Readmission Risk Interventions     No data to display

## 2023-10-24 NOTE — Discharge Summary (Addendum)
 Physician Discharge Summary  Micah Galeno ZOX:[id number] DOB: 1945-08-06 DOA: 10/17/2023  PCP: Excell Seltzer, MD  Admit date: 10/17/2023 Discharge date: 10/24/2023  Admitted From: Home Disposition:  SNF  Recommendations for Outpatient Follow-up:  Follow up with PCP in 1-2 weeks Please obtain CMP/CBC in one week Recommend wearing compression stockings bilaterally to help with her autonomic dysfunction and occasional orthostasis-- may need abdominal binder  monitor blood sugars- use SSI  Discharge Condition: Stable CODE STATUS: Full Diet recommendation: Cardiac  Brief/Interim Summary: Brief Narrative:  77 year old with history of insulin-dependent diabetes, autoimmune cirrhosis, CHF, depression, CKD stage II comes to the hospital with orthostasis, fall at home.  Patient is found to have COVID-19 infection and urinary tract infection.  Trauma workup does not show any acute pathology.  Upon admission patient was asymptomatic from COVID-19 infection standpoint therefore receiving supportive treatment.  For UTI placed on IV Rocephin.  PT/OT for weakness.  During the hospitalization Trauma workup including CT head, cervical, thoracic spine, CAP negative for acute pathology.  Bilateral shoulder x-rays, right foot and right knee, right hip x-ray are all negative for acute fracture or dislocation.  Vitamin D and TSH is normal.  Occasionally positive orthostatics.  I suspect she suffers quite a bit from autonomic dysfunction due to advanced age and diabetes.  She has been educated on how to prevent falls secondary to autonomic dysfunction.  Compression stocking was ordered.  Echocardiogram was unremarkable with preserved EF.  PT/OT recommended SNF.  TOC currently working on placement.   Assessment & Plan:  Principal Problem:   Dizziness Active Problems:   Near syncope   Mobile echo density -seen on TTE 3/31 -cards consult: Given that it is subcentimeter without embolic phenomenon, no strong  indication for surgery consultation at this time, particularly given that the patient is not an optimal surgical candidate given family reported history of dementia, autoimmune hepatitis with cirrhosis, diabetes, OSA, and prior renal cell carcinoma.  -MRI negative for embolic events   ?  A fib -patient to wear event monitor -anticoagulation deferred for now due to high fall risk.cirrhosis -to follow up with cards outpatient   Fall - Trauma workup including CT head, cervical, thoracic spine, CAP negative for acute pathology.  Bilateral shoulder x-rays, right foot and right knee, right hip x-ray are all negative for acute fracture or dislocation.  Vitamin D and TSH is normal.  Occasionally positive orthostatics.  suspect she suffers quite a bit from autonomic dysfunction due to advanced age and diabetes.  She has been educated on how to prevent falls secondary to autonomic dysfunction. - Compression stocking -PT/OT-SNF.   COVID-19 infection -No evidence of hypoxia.  Supportive care   Urinary tract infection -Urine cultures not sent, already on Rocephin > transition to Keflex.  EOT 4/2 -Previously has grown pansensitive Citrobacter and Proteus.   Insulin-dependent diabetes mellitus -On Semglee, sliding scale and Accu-Chek here in hospital  -A1c 7.9 -On outpatient insulin pump   History of congestive heart failure, preserved EF - Currently on Lasix - echo-- see above   CKD stage II History of renal cell carcinoma status post partial nephrectomy January 2024 - Crea stable at 1.0   History of autoimmune cirrhosis -On daily prednisone   Depression - Cymbalta   GERD - PPI   Lung nodules - Follow-up outpatient pulmonary and oncology   PT/OT-SNF.  Patient is quite motivated to get better    Discharge Diagnoses:  Principal Problem:   Dizziness Active Problems:   Near syncope  Discharge Exam: Vitals:   10/23/23 2100 10/24/23 0614  BP: (!) 150/66 (!) 141/67   Pulse: 76 61  Resp: 18 18  Temp: 98.6 F (37 C) 98.4 F (36.9 C)  SpO2: 100% 100%   Vitals:   10/23/23 0522 10/23/23 1350 10/23/23 2100 10/24/23 0614  BP: (!) 164/81 (!) 160/62 (!) 150/66 (!) 141/67  Pulse: 68 71 76 61  Resp: 18 16 18 18   Temp: 98.1 F (36.7 C) 98.7 F (37.1 C) 98.6 F (37 C) 98.4 F (36.9 C)  TempSrc: Oral Oral Oral Oral  SpO2: 100% 100% 100% 100%  Weight:      Height:          Discharge Instructions  Discharge Instructions     Diet Carb Modified   Complete by: As directed    Increase activity slowly   Complete by: As directed       Allergies as of 10/24/2023       Reactions   Codeine Other (See Comments)   Unknown    Doxycycline Hives   Epinephrine Hcl Other (See Comments)   Had tremors when it was given.   Erythromycin Ethylsuccinate Other (See Comments)   Abdominal spasms   Eucalyptus Oil Other (See Comments)   Unknown    Iohexol Hives   Pt needs premedicated-- hives on prev contrast study per md office   Menthol Other (See Comments)   Unknown    Metformin Diarrhea   Oxycodone Other (See Comments)   "felt like I was dying"   Tape Rash        Medication List     STOP taking these medications    insulin lispro 100 UNIT/ML injection Commonly known as: HUMALOG       TAKE these medications    albuterol 108 (90 Base) MCG/ACT inhaler Commonly known as: ProAir HFA Inhale 2 puffs into the lungs every 6 (six) hours as needed for wheezing or shortness of breath.   atenolol 25 MG tablet Commonly known as: TENORMIN TAKE 1 TABLET (25 MG TOTAL) BY MOUTH DAILY.   cetirizine 10 MG tablet Commonly known as: ZYRTEC Take 10 mg by mouth daily.   Coenzyme Q10 100 MG Tabs Take 100 mg by mouth daily.   CRANBERRY PO Take 1,000 mg by mouth in the morning and at bedtime.   D-MANNOSE PO Take 4,000 mg by mouth in the morning and at bedtime.   DULoxetine 60 MG capsule Commonly known as: Cymbalta Take 1 capsule (60 mg total) by  mouth daily.   furosemide 40 MG tablet Commonly known as: LASIX Take 40 mg by mouth daily.   hydrOXYzine 25 MG tablet Commonly known as: ATARAX Take 1 tablet (25 mg total) by mouth 2 (two) times daily as needed for anxiety (irritability).   insulin aspart 100 UNIT/ML injection Commonly known as: novoLOG Inject 0-15 Units into the skin 3 (three) times daily with meals.   insulin aspart 100 UNIT/ML injection Commonly known as: novoLOG Inject 0-5 Units into the skin at bedtime.   insulin aspart 100 UNIT/ML injection Commonly known as: novoLOG Inject 4 Units into the skin 3 (three) times daily with meals.   insulin glargine-yfgn 100 UNIT/ML injection Commonly known as: SEMGLEE Inject 0.14 mLs (14 Units total) into the skin 2 (two) times daily.   pantoprazole 40 MG tablet Commonly known as: PROTONIX TAKE 1 TABLET BY MOUTH EVERY DAY   polyethylene glycol 17 g packet Commonly known as: MIRALAX / GLYCOLAX Take 17 g by  mouth daily as needed for mild constipation.   predniSONE 10 MG tablet Commonly known as: DELTASONE Take 10 mg by mouth daily with breakfast.   traMADol 50 MG tablet Commonly known as: ULTRAM Take 1 tablet (50 mg total) by mouth every 8 (eight) hours as needed for severe pain (pain score 7-10).   UNABLE TO FIND Take 1 tablet by mouth in the morning and at bedtime. Med Name: Cory Roughen Vitamin        Contact information for follow-up providers     Bedsole, Amy E, MD Follow up in 1 week(s).   Specialty: Family Medicine Contact information: [address] [place] [address] [phone]              Contact information for after-discharge care     Destination     [facility] Preferred SNF .   Service: Skilled Nursing Contact information: [address] [place] [address] [phone]                    Allergies  Allergen Reactions   Codeine Other (See Comments)     Unknown    Doxycycline Hives   Epinephrine Hcl Other (See Comments)    Had tremors when it was given.   Erythromycin Ethylsuccinate Other (See Comments)    Abdominal spasms   Eucalyptus Oil Other (See Comments)    Unknown    Iohexol Hives    Pt needs premedicated-- hives on prev contrast study per md office    Menthol Other (See Comments)    Unknown    Metformin Diarrhea   Oxycodone Other (See Comments)    "felt like I was dying"   Tape Rash    You were cared for by a hospitalist during your hospital stay. If you have any questions about your discharge medications or the care you received while you were in the hospital after you are discharged, you can call the unit and asked to speak with the hospitalist on call if the hospitalist that took care of you is not available. Once you are discharged, your primary care physician will handle any further medical issues. Please note that no refills for any discharge medications will be authorized once you are discharged, as it is imperative that you return to your primary care physician (or establish a relationship with a primary care physician if you do not have one) for your aftercare needs so that they can reassess your need for medications and monitor your lab values.  You were cared for by a hospitalist during your hospital stay. If you have any questions about your discharge medications or the care you received while you were in the hospital after you are discharged, you can call the unit and asked to speak with the hospitalist on call if the hospitalist that took care of you is not available. Once you are discharged, your primary care physician will handle any further medical issues. Please note that NO REFILLS for any discharge medications will be authorized once you are discharged, as it is imperative that you return to your primary care physician (or establish a relationship with a primary care physician if you do not have one) for your  aftercare needs so that they can reassess your need for medications and monitor your lab values.  Please request your Prim.MD to go over all Hospital Tests and Procedure/Radiological results at the follow up, please get all Hospital records sent to your  Prim MD by signing hospital release before you go home.  Get CBC, CMP, 2 view Chest X ray checked  by Primary MD during your next visit or SNF MD in 5-7 days ( we routinely change or add medications that can affect your baseline labs and fluid status, therefore we recommend that you get the mentioned basic workup next visit with your PCP, your PCP may decide not to get them or add new tests based on their clinical decision)  On your next visit with your primary care physician please Get Medicines reviewed and adjusted.  If you experience worsening of your admission symptoms, develop shortness of breath, life threatening emergency, suicidal or homicidal thoughts you must seek medical attention immediately by calling 911 or calling your MD immediately  if symptoms less severe.  You Must read complete instructions/literature along with all the possible adverse reactions/side effects for all the Medicines you take and that have been prescribed to you. Take any new Medicines after you have completely understood and accpet all the possible adverse reactions/side effects.   Do not drive, operate heavy machinery, perform activities at heights, swimming or participation in water activities or provide baby sitting services if your were admitted for syncope or siezures until you have seen by Primary MD or a Neurologist and advised to do so again.  Do not drive when taking Pain medications.   Procedures/Studies: MR BRAIN WO CONTRAST Result Date: 10/23/2023 CLINICAL DATA:  78 year old female with altered mental status and frequent falls. EXAM: MRI HEAD WITHOUT CONTRAST TECHNIQUE: Multiplanar, multiecho pulse sequences of the brain and surrounding structures were  obtained without intravenous contrast. COMPARISON:  Head CT 10/18/2023.  Brain MRI 08/10/2004. FINDINGS: Brain: Cerebral volume is within normal limits for age. No restricted diffusion to suggest acute infarction. No midline shift, mass effect, evidence of mass lesion, ventriculomegaly, extra-axial collection or acute intracranial hemorrhage. Cervicomedullary junction and pituitary are within normal limits. Scattered mild for age subcortical and other patchy white matter T2 and FLAIR hyperintensity. No cortical encephalomalacia. No chronic cerebral blood products. Subtle chronic lacunar infarct in the left thalamus series 12, image 88. Otherwise deep gray nuclei appear normal. Mild patchy T2 hyperintensity in the pons. Cerebellum within normal limits. Vascular: Major intracranial vascular flow voids are preserved. Skull and upper cervical spine: Negative for age visible cervical spine. Visualized bone marrow signal is within normal limits. Sinuses/Orbits: Negative orbits. Paranasal sinuses are well aerated. Other: Mastoids are well aerated. Grossly normal visible internal auditory structures. Negative visible scalp and face. IMPRESSION: 1. No acute intracranial abnormality. 2. Mild for age signal changes in the brain compatible with chronic small vessel disease. Electronically Signed   By: Odessa Fleming M.D.   On: 10/23/2023 12:05   ECHOCARDIOGRAM COMPLETE Result Date: 10/20/2023    ECHOCARDIOGRAM REPORT   Patient Name:   [facility] Date of Exam: 10/20/2023 Medical Rec #:  [id number]     Height:       63.0 in Accession #:    [id number]    Weight:       187.6 lb Date of Birth:  1946/02/10      BSA:          1.882 m Patient Age:    77 years      BP:           130/81 mmHg Patient Gender: F             HR:  75 bpm. Exam Location:  Inpatient Procedure: 2D Echo, Cardiac Doppler and Color Doppler (Both Spectral and Color            Flow Doppler were utilized during procedure). Indications:    I42.9 Cardiomyopathy  (unspecified)  History:        Patient has prior history of Echocardiogram examinations, most                 recent 07/16/2022. CHF, Abnormal ECG, Arrythmias:SVT,                 Signs/Symptoms:Dizziness/Lightheadedness, Syncope and Altered                 Mental Status; Risk Factors:Hypertension, Diabetes, Dyslipidemia                 and Sleep Apnea. Covid positive. Cancer.  Sonographer:    Sheralyn Boatman RDCS Referring Phys: [id number] Miguel Rota  Sonographer Comments: Technically difficult study due to poor echo windows and suboptimal parasternal window. Image acquisition challenging due to patient body habitus. Patient coughing througout exam. IMPRESSIONS  1. 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 There is no outflow obstruction. Left ventricular ejection fraction, by estimation, is 60 to 65%. The left ventricle has normal function. The left ventricle has no regional wall motion abnormalities. Left ventricular diastolic parameters are consistent with Grade I diastolic dysfunction (impaired relaxation).  2. Right ventricular systolic function is normal. The right ventricular size is normal.  3. The mitral valve is grossly normal. Trivial mitral valve regurgitation. No evidence of mitral stenosis. Moderate mitral annular calcification.  4. The aortic valve was not well visualized. There is mild calcification of the aortic valve. Aortic valve regurgitation is not visualized. Aortic valve sclerosis/calcification is present, without any evidence of aortic stenosis.  5. The inferior vena cava is normal in size with greater than 50% respiratory variability, suggesting right atrial pressure of 3 mmHg. Comparison(s): Prior images reviewed side by side. The LV basal septal mobile echodensity was not previously seen. Conclusion(s)/Recommendation(s): Consider TEE to  clarify the left ventricular basal septum mobile echodensity, if the patient has a history of unexplained embolic events and would be a candidate for cardiac surgery. 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 There is no outflow obstruction. Left ventricular ejection fraction, by estimation, is 60 to 65%. The left ventricle has normal function. The left ventricle has no regional wall motion abnormalities. The left ventricular internal cavity size was normal in size. There is no left ventricular hypertrophy. Left ventricular diastolic parameters are consistent with Grade I diastolic dysfunction (impaired relaxation). Right Ventricle: The right ventricular size is normal. No increase in right ventricular wall thickness. Right ventricular systolic function is normal. Left Atrium: Left atrial size was normal in size. Right Atrium: Right atrial size was normal in size. Pericardium: There is no evidence of pericardial effusion. Mitral Valve: The mitral valve is grossly normal. There is moderate calcification of the mitral valve leaflet(s). Moderate mitral annular calcification. Trivial mitral valve regurgitation. No evidence of mitral valve stenosis. MV peak gradient, 6.1 mmHg.  The mean mitral valve gradient is 2.0 mmHg. Tricuspid Valve: The tricuspid valve is normal in structure. Tricuspid valve regurgitation is trivial.  No evidence of tricuspid stenosis. Aortic Valve: The aortic valve was not well visualized. There is mild calcification of the aortic valve. Aortic valve regurgitation is not visualized. Aortic valve sclerosis/calcification is present, without any evidence of aortic stenosis. Pulmonic Valve: The pulmonic valve was not well visualized. Pulmonic valve regurgitation is not visualized. No evidence of pulmonic stenosis. Aorta:  The aortic root and ascending aorta are structurally normal, with no evidence of dilitation. Venous: The inferior vena cava is normal in size with greater than 50% respiratory variability, suggesting right atrial pressure of 3 mmHg. IAS/Shunts: No atrial level shunt detected by color flow Doppler.  LEFT VENTRICLE PLAX 2D LVIDd:         3.80 cm     Diastology LVIDs:         2.60 cm     LV e' medial:    6.09 cm/s LV PW:         0.90 cm     LV E/e' medial:  11.5 LV IVS:        0.90 cm     LV e' lateral:   6.53 cm/s LVOT diam:     2.20 cm     LV E/e' lateral: 10.7 LV SV:         73 LV SV Index:   39 LVOT Area:     3.80 cm  LV Volumes (MOD) LV vol d, MOD A2C: 54.6 ml LV vol d, MOD A4C: 55.4 ml LV vol s, MOD A2C: 17.4 ml LV vol s, MOD A4C: 18.5 ml LV SV MOD A2C:     37.2 ml LV SV MOD A4C:     55.4 ml LV SV MOD BP:      38.7 ml RIGHT VENTRICLE             IVC RV S prime:     13.50 cm/s  IVC diam: 1.30 cm TAPSE (M-mode): 2.0 cm LEFT ATRIUM             Index        RIGHT ATRIUM           Index LA diam:        3.60 cm 1.91 cm/m   RA Area:     14.90 cm LA Vol (A2C):   37.6 ml 19.98 ml/m  RA Volume:   36.90 ml  19.61 ml/m LA Vol (A4C):   22.4 ml 11.90 ml/m LA Biplane Vol: 29.4 ml 15.62 ml/m  AORTIC VALVE LVOT Vmax:   117.00 cm/s LVOT Vmean:  74.500 cm/s LVOT VTI:    0.193 m  AORTA Ao Root diam: 2.80 cm Ao Asc diam:  3.20 cm MITRAL VALVE MV Area (PHT): 2.84 cm     SHUNTS MV Area VTI:   2.49 cm     Systemic VTI:  0.19 m MV Peak grad:  6.1 mmHg     Systemic Diam: 2.20 cm MV Mean grad:  2.0 mmHg MV Vmax:       1.23 m/s MV Vmean:      63.2 cm/s MV Decel Time: 267 msec MV E velocity: 69.85 cm/s MV A velocity: 103.68 cm/s MV E/A ratio:  0.67 Mihai Croitoru MD Electronically signed by Thurmon Fair MD Signature Date/Time: 10/20/2023/12:53:51 PM    Final    DG Foot Complete Right Result Date: 10/18/2023 CLINICAL DATA:  Fall EXAM: RIGHT FOOT COMPLETE - 3+ VIEW COMPARISON:  Right foot MRI 12/24/2014 FINDINGS: Screw fixation of  the calcaneus. No acute fracture or  dislocation. Soft tissues are unremarkable. IMPRESSION: No acute fracture or dislocation. Electronically Signed   By: Minerva Fester M.D.   On: 10/18/2023 02:18   DG Knee Complete 4 Views Right Result Date: 10/18/2023 CLINICAL DATA:  Fall EXAM: RIGHT KNEE - COMPLETE 4+ VIEW COMPARISON:  None Available. FINDINGS: No acute fracture or dislocation. No knee joint effusion. Vascular calcifications. IMPRESSION: Negative. Electronically Signed   By: Minerva Fester M.D.   On: 10/18/2023 02:16   DG HIP UNILAT WITH PELVIS 2-3 VIEWS RIGHT Result Date: 10/18/2023 CLINICAL DATA:  Fall EXAM: DG HIP (WITH OR WITHOUT PELVIS) 2-3V RIGHT COMPARISON:  Radiographs heel in [id number] FINDINGS: No acute fracture or dislocation. Degenerative changes pubic symphysis, both hips, SI joints and lower lumbar spine. IMPRESSION: No acute fracture or dislocation. Electronically Signed   By: Minerva Fester M.D.   On: 10/18/2023 02:14   DG Shoulder Right Result Date: 10/18/2023 CLINICAL DATA:  Fall EXAM: RIGHT SHOULDER - 2+ VIEW; LEFT SHOULDER - 2+ VIEW COMPARISON:  None. FINDINGS: No acute fracture or dislocation of the bilateral shoulders. Degenerative arthritis both AC joints. Chronic changes about the distal right clavicle. Linear calcification superior to the right humeral head likely due to calcific tendinopathy. IMPRESSION: No acute fracture or dislocation. Electronically Signed   By: Minerva Fester M.D.   On: 10/18/2023 02:13   DG Shoulder Left Result Date: 10/18/2023 CLINICAL DATA:  Fall EXAM: RIGHT SHOULDER - 2+ VIEW; LEFT SHOULDER - 2+ VIEW COMPARISON:  None. FINDINGS: No acute fracture or dislocation of the bilateral shoulders. Degenerative arthritis both AC joints. Chronic changes about the distal right clavicle. Linear calcification superior to the right humeral head likely due to calcific tendinopathy. IMPRESSION: No acute fracture or dislocation. Electronically Signed   By: Minerva Fester M.D.   On: 10/18/2023 02:13   CT CHEST ABDOMEN PELVIS WO CONTRAST Result Date: 10/18/2023 CLINICAL DATA:  Polytrauma, blunt.  Fall EXAM: CT CHEST, ABDOMEN AND PELVIS WITHOUT CONTRAST TECHNIQUE: Multidetector CT imaging of the chest, abdomen and pelvis was performed following the standard protocol without IV contrast. RADIATION DOSE REDUCTION: This exam was performed according to the departmental dose-optimization program which includes automated exposure control, adjustment of the mA and/or kV according to patient size and/or use of iterative reconstruction technique. COMPARISON:  06/30/2023 FINDINGS: CT CHEST FINDINGS Cardiovascular: No significant vascular findings. Normal heart size. No pericardial effusion. Mild atherosclerotic calcification within the thoracic aorta. Mediastinum/Nodes: No enlarged mediastinal, hilar, or axillary lymph nodes. Thyroid gland, trachea, and esophagus demonstrate no significant findings. Lungs/Pleura: Branching nodular density is again seen within the medial right lower lobe at axial image # 118/7 and sagittal image # 89/11 compatible with a impacted terminal. Associated bronchial wall thickening is present keeping with airway inflammation. There is stable mild bibasilar cylindrical bronchiectasis. Additional mucous plugging lateral segment of the left is also unchanged. Stable 4 mm subpleural pulmonary nodule within the left lower lobe, axial image # 63/7. Stable minimal parenchymal subpleural scarring within the right lung base. No focal pulmonary infiltrate. No pneumothorax or pleural effusion. No central obstructing lesion. Musculoskeletal: No acute bone abnormality. No lytic or blastic bone lesion. CT ABDOMEN PELVIS FINDINGS Hepatobiliary: Status post cholecystectomy. No intra or extrahepatic biliary ductal dilation. Nodular contour of the liver with relative hypertrophy of the left hepatic lobe noted in keeping with changes of cirrhosis. 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 The pancreas is otherwise unremarkable. Spleen: Unremarkable Adrenals/Urinary Tract: Status post left radical nephrectomy. Residual right adrenal gland and right kidney unremarkable. Bladder is unremarkable. Stomach/Bowel: Moderate pancolonic diverticulosis. Stomach, small bowel, and large bowel are otherwise unremarkable. Appendix absent. No evidence of obstruction or focal inflammation. No free intraperitoneal gas or fluid. Vascular/Lymphatic: Aortic atherosclerosis. No enlarged abdominal or pelvic lymph nodes. Reproductive: Status post hysterectomy. No adnexal masses. Other: No abdominal wall hernia or abnormality. No abdominopelvic ascites. Musculoskeletal: Osseous structures are age-appropriate. No acute bone abnormality. No lytic or blastic bone lesion. IMPRESSION: 1. No acute intrathoracic or intra-abdominal injury. 2. Stable branching nodular density within the lower lobes bilaterally compatible with impacted terminal bronchi. Associated bronchial wall thickening is present keeping with airway inflammation. 3. Stable 4 mm subpleural pulmonary nodule within the left lower lobe. 4. Moderate pancolonic diverticulosis without superimposed acute inflammatory change. 5. Multiple stable cysts within the pancreatic body and tail measuring up to 13 mm, stable since remote PET CT 09/26/2022 and not metabolically active. Attention on follow-up imaging is recommended. 6. Status post left radical nephrectomy. 7. Nodular contour of the liver with relative hypertrophy of the left hepatic lobe noted in keeping with changes of cirrhosis. Aortic Atherosclerosis (ICD10-I70.0). Electronically Signed   By: Helyn Numbers M.D.   On: 10/18/2023 02:09   CT T-SPINE NO CHARGE Result Date: 10/18/2023 CLINICAL DATA:  Fall, blunt back trauma EXAM: CT  THORACIC SPINE WITHOUT CONTRAST TECHNIQUE: Multidetector CT images of the thoracic were obtained using the standard protocol without intravenous contrast. RADIATION DOSE REDUCTION: This exam was performed according to the departmental dose-optimization program which includes automated exposure control, adjustment of the mA and/or kV according to patient size and/or use of iterative reconstruction technique. COMPARISON:  None Available. FINDINGS: Alignment: Normal. Vertebrae: No acute fracture or focal pathologic process. Bridging disc osteophytes are seen throughout the mid and lower lumbar spine in keeping with changes of diffuse idiopathic skeletal hyperostosis. Paraspinal and other soft tissues: Negative. Disc levels: Intervertebral disc heights are preserved. No significant canal stenosis. 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 IMPRESSION: 1. No acute fracture or listhesis of the thoracic spine. 2. 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 Electronically Signed   By: Helyn Numbers M.D.   On: 10/18/2023 01:54   CT Head Wo Contrast Result Date: 10/18/2023 CLINICAL DATA:  Head and neck trauma.  Fall. EXAM: CT HEAD WITHOUT CONTRAST CT CERVICAL SPINE WITHOUT CONTRAST TECHNIQUE: Multidetector CT imaging of the head and cervical spine was performed following the standard protocol without intravenous contrast. Multiplanar CT image reconstructions of the cervical spine were also generated. RADIATION DOSE REDUCTION: This exam was performed according to the departmental dose-optimization program which includes automated exposure control, adjustment of the mA and/or kV according to patient size and/or use of iterative reconstruction technique. COMPARISON:  MRI head 08/10/2004 and MR cervical spine 12/28/2004 FINDINGS: CT HEAD FINDINGS Brain: No intracranial hemorrhage, mass effect, or evidence of acute infarct. No hydrocephalus. No  extra-axial fluid collection. Age related cerebral atrophy and chronic small vessel ischemic disease. Vascular: No hyperdense vessel. Intracranial arterial calcification. Skull: No fracture or focal lesion. Sinuses/Orbits: No acute finding. Other: None. CT CERVICAL SPINE FINDINGS Alignment: No evidence of traumatic malalignment. Skull base and vertebrae: No acute fracture. No primary bone lesion or focal pathologic process. Soft tissues and spinal canal: No prevertebral fluid or swelling. No visible canal hematoma.  Disc levels: Multilevel spondylosis and facet arthropathy. No severe spinal canal narrowing. Upper chest: No acute abnormality. Other: Carotid calcification. IMPRESSION: 1. No acute intracranial abnormality. 2. No acute fracture in the cervical spine. Electronically Signed   By: Minerva Fester M.D.   On: 10/18/2023 01:44   CT Cervical Spine Wo Contrast Result Date: 10/18/2023 CLINICAL DATA:  Head and neck trauma.  Fall. EXAM: CT HEAD WITHOUT CONTRAST CT CERVICAL SPINE WITHOUT CONTRAST TECHNIQUE: Multidetector CT imaging of the head and cervical spine was performed following the standard protocol without intravenous contrast. Multiplanar CT image reconstructions of the cervical spine were also generated. RADIATION DOSE REDUCTION: This exam was performed according to the departmental dose-optimization program which includes automated exposure control, adjustment of the mA and/or kV according to patient size and/or use of iterative reconstruction technique. COMPARISON:  MRI head 08/10/2004 and MR cervical spine 12/28/2004 FINDINGS: CT HEAD FINDINGS Brain: No intracranial hemorrhage, mass effect, or evidence of acute infarct. No hydrocephalus. No extra-axial fluid collection. Age related cerebral atrophy and chronic small vessel ischemic disease. Vascular: No hyperdense vessel. Intracranial arterial calcification. Skull: No fracture or focal lesion. Sinuses/Orbits: No acute finding. Other: None. CT  CERVICAL SPINE FINDINGS Alignment: No evidence of traumatic malalignment. Skull base and vertebrae: No acute fracture. No primary bone lesion or focal pathologic process. Soft tissues and spinal canal: No prevertebral fluid or swelling. No visible canal hematoma. Disc levels: Multilevel spondylosis and facet arthropathy. No severe spinal canal narrowing. Upper chest: No acute abnormality. Other: Carotid calcification. IMPRESSION: 1. No acute intracranial abnormality. 2. No acute fracture in the cervical spine. Electronically Signed   By: Minerva Fester M.D.   On: 10/18/2023 01:44     The results of significant diagnostics from this hospitalization (including imaging, microbiology, ancillary and laboratory) are listed below for reference.     Microbiology: Recent Results (from the past 240 hours)  SARS Coronavirus 2 by RT PCR (hospital order, performed in [facility] hospital lab) *cepheid single result test* Anterior Nasal Swab     Status: Abnormal   Collection Time: 10/18/23  6:12 AM   Specimen: Anterior Nasal Swab  Result Value Ref Range Status   SARS Coronavirus 2 by RT PCR POSITIVE (A) NEGATIVE Final    Comment: (NOTE) SARS-CoV-2 target nucleic acids are DETECTED  SARS-CoV-2 RNA is generally detectable in upper respiratory specimens  during the acute phase of infection.  Positive results are indicative  of the presence of the identified virus, but do not rule out bacterial infection or co-infection with other pathogens not detected by the test.  Clinical correlation with patient history and  other diagnostic information is necessary to determine patient infection status.  The expected result is negative.  Fact Sheet for Patients:   RoadLapTop.co.za   Fact Sheet for Healthcare Providers:   http://kim-miller.com/    This test is not yet approved or cleared by the [place] FDA and  has been authorized for detection and/or diagnosis of  SARS-CoV-2 by FDA under an Emergency Use Authorization (EUA).  This EUA will remain in effect (meaning this test can be used) for the duration of  the COVID-19 declaration under Section 564(b)(1)  of the Act, 21 U.S.C. section 360-bbb-3(b)(1), unless the authorization is terminated or revoked sooner.   Performed at [facility], [address] [place], [place] [address]      Labs: BNP (last 3 results) No results for input(s): "BNP" in the last 8760 hours. Basic Metabolic Panel: Recent Labs  Lab 10/20/23 0335 10/21/23 0413 10/22/23 0412 10/23/23 0408 10/24/23 0349  NA 133* 137 140 136 138  K 3.7 3.9 3.8 4.0 4.5  CL 100 104 109 108 105  CO2 25 26 23 22 26   GLUCOSE 248* 154* 136* 89 162*  BUN 29* 25* 19 18 25*  CREATININE 1.08* 1.06* 0.88 1.02* 0.94  CALCIUM 9.0 9.1 9.3 9.2 9.5  MG 1.7 1.8 1.7 1.8 1.8  PHOS 3.1  --   --   --   --    Liver Function Tests: Recent Labs  Lab 10/18/23 0218  AST 43*  ALT 43  ALKPHOS 128*  BILITOT 1.4*  PROT 6.4*  ALBUMIN 3.4*   No results for input(s): "LIPASE", "AMYLASE" in the last 168 hours. No results for input(s): "AMMONIA" in the last 168 hours. CBC: Recent Labs  Lab 10/18/23 0218 10/19/23 0403 10/20/23 0335 10/21/23 0413 10/22/23 0412 10/23/23 0408 10/24/23 0349  WBC 7.3   < > 4.0 4.1 4.1 5.0 5.2  NEUTROABS 5.4  --   --   --   --   --   --   HGB 13.5   < > 11.4* 11.3* 11.4* 11.5* 11.5*  HCT 40.5   < > 34.7* 34.5* 34.8* 36.7 35.5*  MCV 90.2   < > 91.8 92.5 93.5 96.8 93.7  PLT 140*   < > 107* 112* 120* 133* 143*   < > = values in this interval not displayed.   Cardiac Enzymes: No results for input(s): "CKTOTAL", "CKMB", "CKMBINDEX", "TROPONINI" in the last 168 hours. BNP: Invalid input(s): "POCBNP" CBG: Recent Labs  Lab 10/23/23 0738 10/23/23 1153 10/23/23 1626 10/23/23 2105 10/24/23 0751  GLUCAP 76 208* 286* 266* 112*   D-Dimer No results for input(s): "DDIMER" in the last 72  hours. Hgb A1c No results for input(s): "HGBA1C" in the last 72 hours. Lipid Profile No results for input(s): "CHOL", "HDL", "LDLCALC", "TRIG", "CHOLHDL", "LDLDIRECT" in the last 72 hours. Thyroid function studies No results for input(s): "TSH", "T4TOTAL", "T3FREE", "THYROIDAB" in the last 72 hours.  Invalid input(s): "FREET3"  Anemia work up No results for input(s): "VITAMINB12", "FOLATE", "FERRITIN", "TIBC", "IRON", "RETICCTPCT" in the last 72 hours. Urinalysis    Component Value Date/Time   COLORURINE AMBER (A) 10/18/2023 0350   APPEARANCEUR HAZY (A) 10/18/2023 0350   LABSPEC 1.017 10/18/2023 0350   PHURINE 5.0 10/18/2023 0350   GLUCOSEU >=500 (A) 10/18/2023 0350   HGBUR NEGATIVE 10/18/2023 0350   HGBUR moderate 03/28/2010 0931   BILIRUBINUR NEGATIVE 10/18/2023 0350   BILIRUBINUR Negative 09/21/2021 1614   KETONESUR NEGATIVE 10/18/2023 0350   PROTEINUR NEGATIVE 10/18/2023 0350   UROBILINOGEN 0.2 09/21/2021 1614   UROBILINOGEN 0.2 03/28/2010 0931   NITRITE NEGATIVE 10/18/2023 0350   LEUKOCYTESUR MODERATE (A) 10/18/2023 0350   Sepsis Labs Recent Labs  Lab 10/21/23 0413 10/22/23 0412 10/23/23 0408 10/24/23 0349  WBC 4.1 4.1 5.0 5.2   Microbiology Recent Results (from the past 240 hours)  SARS Coronavirus 2 by RT PCR (hospital order, performed in [facility] hospital lab) *cepheid single result test* Anterior Nasal Swab     Status: Abnormal   Collection Time: 10/18/23  6:12 AM   Specimen: Anterior Nasal Swab  Result Value Ref Range Status   SARS Coronavirus 2 by RT PCR POSITIVE (A) NEGATIVE Final    Comment: (NOTE) SARS-CoV-2 target nucleic acids are DETECTED  SARS-CoV-2 RNA is generally detectable in upper respiratory specimens  during the acute phase of infection.  Positive  results are indicative  of the presence of the identified virus, but do not rule out bacterial infection or co-infection with other pathogens not detected by the test.  Clinical correlation  with patient history and  other diagnostic information is necessary to determine patient infection status.  The expected result is negative.  Fact Sheet for Patients:   RoadLapTop.co.za   Fact Sheet for Healthcare Providers:   http://kim-miller.com/    This test is not yet approved or cleared by the [place] FDA and  has been authorized for detection and/or diagnosis of SARS-CoV-2 by FDA under an Emergency Use Authorization (EUA).  This EUA will remain in effect (meaning this test can be used) for the duration of  the COVID-19 declaration under Section 564(b)(1)  of the Act, 21 U.S.C. section 360-bbb-3(b)(1), unless the authorization is terminated or revoked sooner.   Performed at [facility], [address], [place], [place] [address]      Time coordinating discharge:  I have spent 35 minutes face to face with the patient and on the ward discussing the patients care, assessment, plan and disposition with other care givers. >50% of the time was devoted counseling the patient about the risks and benefits of treatment/Discharge disposition and coordinating care.   SIGNED:   Joseph Art, DO [facility] 10/24/2023, 11:52 AM   If 7PM-7AM, please contact night-coverage

## 2023-10-27 ENCOUNTER — Other Ambulatory Visit: Payer: Self-pay | Admitting: Student

## 2023-10-27 ENCOUNTER — Encounter: Payer: Self-pay | Admitting: *Deleted

## 2023-10-27 DIAGNOSIS — Z905 Acquired absence of kidney: Secondary | ICD-10-CM | POA: Diagnosis not present

## 2023-10-27 DIAGNOSIS — I951 Orthostatic hypotension: Secondary | ICD-10-CM | POA: Diagnosis not present

## 2023-10-27 DIAGNOSIS — I4891 Unspecified atrial fibrillation: Secondary | ICD-10-CM | POA: Diagnosis not present

## 2023-10-27 DIAGNOSIS — N390 Urinary tract infection, site not specified: Secondary | ICD-10-CM | POA: Diagnosis not present

## 2023-10-27 DIAGNOSIS — R42 Dizziness and giddiness: Secondary | ICD-10-CM

## 2023-10-27 DIAGNOSIS — I5030 Unspecified diastolic (congestive) heart failure: Secondary | ICD-10-CM | POA: Diagnosis not present

## 2023-10-27 DIAGNOSIS — F0390 Unspecified dementia without behavioral disturbance: Secondary | ICD-10-CM | POA: Diagnosis not present

## 2023-10-27 DIAGNOSIS — K759 Inflammatory liver disease, unspecified: Secondary | ICD-10-CM | POA: Diagnosis not present

## 2023-10-27 DIAGNOSIS — I480 Paroxysmal atrial fibrillation: Secondary | ICD-10-CM

## 2023-10-27 DIAGNOSIS — E1122 Type 2 diabetes mellitus with diabetic chronic kidney disease: Secondary | ICD-10-CM | POA: Diagnosis not present

## 2023-10-29 DIAGNOSIS — I5030 Unspecified diastolic (congestive) heart failure: Secondary | ICD-10-CM | POA: Diagnosis not present

## 2023-10-29 DIAGNOSIS — K759 Inflammatory liver disease, unspecified: Secondary | ICD-10-CM | POA: Diagnosis not present

## 2023-10-29 DIAGNOSIS — Z905 Acquired absence of kidney: Secondary | ICD-10-CM | POA: Diagnosis not present

## 2023-10-29 DIAGNOSIS — I4891 Unspecified atrial fibrillation: Secondary | ICD-10-CM | POA: Diagnosis not present

## 2023-10-29 DIAGNOSIS — F0390 Unspecified dementia without behavioral disturbance: Secondary | ICD-10-CM | POA: Diagnosis not present

## 2023-10-29 DIAGNOSIS — E1122 Type 2 diabetes mellitus with diabetic chronic kidney disease: Secondary | ICD-10-CM | POA: Diagnosis not present

## 2023-10-29 DIAGNOSIS — I951 Orthostatic hypotension: Secondary | ICD-10-CM | POA: Diagnosis not present

## 2023-10-29 DIAGNOSIS — N390 Urinary tract infection, site not specified: Secondary | ICD-10-CM | POA: Diagnosis not present

## 2023-10-30 DIAGNOSIS — I480 Paroxysmal atrial fibrillation: Secondary | ICD-10-CM | POA: Diagnosis not present

## 2023-10-30 DIAGNOSIS — R42 Dizziness and giddiness: Secondary | ICD-10-CM | POA: Diagnosis not present

## 2023-11-03 ENCOUNTER — Telehealth: Payer: Self-pay | Admitting: Cardiovascular Disease

## 2023-11-03 ENCOUNTER — Telehealth: Payer: Self-pay | Admitting: *Deleted

## 2023-11-03 DIAGNOSIS — N390 Urinary tract infection, site not specified: Secondary | ICD-10-CM | POA: Diagnosis not present

## 2023-11-03 DIAGNOSIS — K759 Inflammatory liver disease, unspecified: Secondary | ICD-10-CM | POA: Diagnosis not present

## 2023-11-03 DIAGNOSIS — I4891 Unspecified atrial fibrillation: Secondary | ICD-10-CM | POA: Diagnosis not present

## 2023-11-03 DIAGNOSIS — E1122 Type 2 diabetes mellitus with diabetic chronic kidney disease: Secondary | ICD-10-CM | POA: Diagnosis not present

## 2023-11-03 DIAGNOSIS — I951 Orthostatic hypotension: Secondary | ICD-10-CM | POA: Diagnosis not present

## 2023-11-03 DIAGNOSIS — I5030 Unspecified diastolic (congestive) heart failure: Secondary | ICD-10-CM | POA: Diagnosis not present

## 2023-11-03 DIAGNOSIS — F0390 Unspecified dementia without behavioral disturbance: Secondary | ICD-10-CM | POA: Diagnosis not present

## 2023-11-03 NOTE — Telephone Encounter (Signed)
 Received monitor results via phone call. Nurse stated the pt's event monitor was showing auto triggered atrial flutter at 137 bpm. Asked nurse to fax results to us . When fax was received it was a Dr. Katheryne Pane pt and results were given to Leanne to send over to [facility].

## 2023-11-03 NOTE — Telephone Encounter (Signed)
 See monitor alert note from today.

## 2023-11-03 NOTE — Telephone Encounter (Signed)
 Calling to give EKG results. Please advise

## 2023-11-03 NOTE — Telephone Encounter (Signed)
   Cardiac Monitor Alert  Date of alert:  11/03/23  Patient Name: [name] [name]  DOB: [birth year]  MRN: [id number]   [facility] Cardiologist: Lauro Portal, MD  [facility] EP:  None    Monitor Information: Cardiac Event Monitor [[employer]]  Reason:  atrial fib Ordering provider:  ordered at discharge   Alert Atrial Fibrillation/Flutter This is the 1st alert for this rhythm.  The patient has a hx of Atrial Fibrillation/Flutter.  The patient is not currently on anticoagulation.  Next Cardiology Appointment   Date:  11/18/23  Provider:  berry  The patient daughter was contacted today.  She is asymptomatic. Daughter reports during elevated heart rate, she was with the patient and the patient was mad because she was not able to go home. She was not having any symptoms at that time.  Other: Discussed with DOD dr Chancy Comber, no changes made at this time.  Augustin Leber, RN  11/03/2023 9:46 AM

## 2023-11-04 ENCOUNTER — Ambulatory Visit: Attending: Student

## 2023-11-04 DIAGNOSIS — F0390 Unspecified dementia without behavioral disturbance: Secondary | ICD-10-CM | POA: Diagnosis not present

## 2023-11-04 DIAGNOSIS — Z905 Acquired absence of kidney: Secondary | ICD-10-CM | POA: Diagnosis not present

## 2023-11-04 DIAGNOSIS — I4891 Unspecified atrial fibrillation: Secondary | ICD-10-CM | POA: Diagnosis not present

## 2023-11-04 DIAGNOSIS — N390 Urinary tract infection, site not specified: Secondary | ICD-10-CM | POA: Diagnosis not present

## 2023-11-04 DIAGNOSIS — I480 Paroxysmal atrial fibrillation: Secondary | ICD-10-CM

## 2023-11-04 DIAGNOSIS — I5030 Unspecified diastolic (congestive) heart failure: Secondary | ICD-10-CM | POA: Diagnosis not present

## 2023-11-04 DIAGNOSIS — R42 Dizziness and giddiness: Secondary | ICD-10-CM

## 2023-11-04 DIAGNOSIS — K759 Inflammatory liver disease, unspecified: Secondary | ICD-10-CM | POA: Diagnosis not present

## 2023-11-04 DIAGNOSIS — E1122 Type 2 diabetes mellitus with diabetic chronic kidney disease: Secondary | ICD-10-CM | POA: Diagnosis not present

## 2023-11-04 DIAGNOSIS — I951 Orthostatic hypotension: Secondary | ICD-10-CM | POA: Diagnosis not present

## 2023-11-05 DIAGNOSIS — F0390 Unspecified dementia without behavioral disturbance: Secondary | ICD-10-CM | POA: Diagnosis not present

## 2023-11-05 DIAGNOSIS — I4891 Unspecified atrial fibrillation: Secondary | ICD-10-CM | POA: Diagnosis not present

## 2023-11-05 DIAGNOSIS — I951 Orthostatic hypotension: Secondary | ICD-10-CM | POA: Diagnosis not present

## 2023-11-05 DIAGNOSIS — K759 Inflammatory liver disease, unspecified: Secondary | ICD-10-CM | POA: Diagnosis not present

## 2023-11-05 DIAGNOSIS — N390 Urinary tract infection, site not specified: Secondary | ICD-10-CM | POA: Diagnosis not present

## 2023-11-05 DIAGNOSIS — E1122 Type 2 diabetes mellitus with diabetic chronic kidney disease: Secondary | ICD-10-CM | POA: Diagnosis not present

## 2023-11-05 DIAGNOSIS — Z905 Acquired absence of kidney: Secondary | ICD-10-CM | POA: Diagnosis not present

## 2023-11-05 DIAGNOSIS — I5030 Unspecified diastolic (congestive) heart failure: Secondary | ICD-10-CM | POA: Diagnosis not present

## 2023-11-06 DIAGNOSIS — N3020 Other chronic cystitis without hematuria: Secondary | ICD-10-CM | POA: Diagnosis not present

## 2023-11-06 DIAGNOSIS — R3915 Urgency of urination: Secondary | ICD-10-CM | POA: Diagnosis not present

## 2023-11-06 DIAGNOSIS — C642 Malignant neoplasm of left kidney, except renal pelvis: Secondary | ICD-10-CM | POA: Diagnosis not present

## 2023-11-07 DIAGNOSIS — I4891 Unspecified atrial fibrillation: Secondary | ICD-10-CM | POA: Diagnosis not present

## 2023-11-07 DIAGNOSIS — Z905 Acquired absence of kidney: Secondary | ICD-10-CM | POA: Diagnosis not present

## 2023-11-07 DIAGNOSIS — F0390 Unspecified dementia without behavioral disturbance: Secondary | ICD-10-CM | POA: Diagnosis not present

## 2023-11-07 DIAGNOSIS — E1122 Type 2 diabetes mellitus with diabetic chronic kidney disease: Secondary | ICD-10-CM | POA: Diagnosis not present

## 2023-11-07 DIAGNOSIS — I5030 Unspecified diastolic (congestive) heart failure: Secondary | ICD-10-CM | POA: Diagnosis not present

## 2023-11-07 DIAGNOSIS — N390 Urinary tract infection, site not specified: Secondary | ICD-10-CM | POA: Diagnosis not present

## 2023-11-07 DIAGNOSIS — K759 Inflammatory liver disease, unspecified: Secondary | ICD-10-CM | POA: Diagnosis not present

## 2023-11-07 DIAGNOSIS — I951 Orthostatic hypotension: Secondary | ICD-10-CM | POA: Diagnosis not present

## 2023-11-10 DIAGNOSIS — Z905 Acquired absence of kidney: Secondary | ICD-10-CM | POA: Diagnosis not present

## 2023-11-10 DIAGNOSIS — E1122 Type 2 diabetes mellitus with diabetic chronic kidney disease: Secondary | ICD-10-CM | POA: Diagnosis not present

## 2023-11-10 DIAGNOSIS — N390 Urinary tract infection, site not specified: Secondary | ICD-10-CM | POA: Diagnosis not present

## 2023-11-10 DIAGNOSIS — I4891 Unspecified atrial fibrillation: Secondary | ICD-10-CM | POA: Diagnosis not present

## 2023-11-10 DIAGNOSIS — K759 Inflammatory liver disease, unspecified: Secondary | ICD-10-CM | POA: Diagnosis not present

## 2023-11-10 DIAGNOSIS — I5030 Unspecified diastolic (congestive) heart failure: Secondary | ICD-10-CM | POA: Diagnosis not present

## 2023-11-10 DIAGNOSIS — I951 Orthostatic hypotension: Secondary | ICD-10-CM | POA: Diagnosis not present

## 2023-11-10 DIAGNOSIS — F0390 Unspecified dementia without behavioral disturbance: Secondary | ICD-10-CM | POA: Diagnosis not present

## 2023-11-12 DIAGNOSIS — N390 Urinary tract infection, site not specified: Secondary | ICD-10-CM | POA: Diagnosis not present

## 2023-11-12 DIAGNOSIS — F0390 Unspecified dementia without behavioral disturbance: Secondary | ICD-10-CM | POA: Diagnosis not present

## 2023-11-12 DIAGNOSIS — I951 Orthostatic hypotension: Secondary | ICD-10-CM | POA: Diagnosis not present

## 2023-11-12 DIAGNOSIS — Z905 Acquired absence of kidney: Secondary | ICD-10-CM | POA: Diagnosis not present

## 2023-11-12 DIAGNOSIS — I4891 Unspecified atrial fibrillation: Secondary | ICD-10-CM | POA: Diagnosis not present

## 2023-11-12 DIAGNOSIS — E1122 Type 2 diabetes mellitus with diabetic chronic kidney disease: Secondary | ICD-10-CM | POA: Diagnosis not present

## 2023-11-12 DIAGNOSIS — K759 Inflammatory liver disease, unspecified: Secondary | ICD-10-CM | POA: Diagnosis not present

## 2023-11-12 DIAGNOSIS — I5030 Unspecified diastolic (congestive) heart failure: Secondary | ICD-10-CM | POA: Diagnosis not present

## 2023-11-13 ENCOUNTER — Telehealth: Payer: Self-pay

## 2023-11-13 DIAGNOSIS — I5030 Unspecified diastolic (congestive) heart failure: Secondary | ICD-10-CM | POA: Diagnosis not present

## 2023-11-13 DIAGNOSIS — E7800 Pure hypercholesterolemia, unspecified: Secondary | ICD-10-CM | POA: Diagnosis not present

## 2023-11-13 DIAGNOSIS — E669 Obesity, unspecified: Secondary | ICD-10-CM | POA: Diagnosis not present

## 2023-11-13 DIAGNOSIS — D631 Anemia in chronic kidney disease: Secondary | ICD-10-CM | POA: Diagnosis not present

## 2023-11-13 DIAGNOSIS — E1142 Type 2 diabetes mellitus with diabetic polyneuropathy: Secondary | ICD-10-CM | POA: Diagnosis not present

## 2023-11-13 DIAGNOSIS — K7460 Unspecified cirrhosis of liver: Secondary | ICD-10-CM | POA: Diagnosis not present

## 2023-11-13 DIAGNOSIS — Z794 Long term (current) use of insulin: Secondary | ICD-10-CM | POA: Diagnosis not present

## 2023-11-13 DIAGNOSIS — J45909 Unspecified asthma, uncomplicated: Secondary | ICD-10-CM | POA: Diagnosis not present

## 2023-11-13 DIAGNOSIS — F0393 Unspecified dementia, unspecified severity, with mood disturbance: Secondary | ICD-10-CM | POA: Diagnosis not present

## 2023-11-13 DIAGNOSIS — F32A Depression, unspecified: Secondary | ICD-10-CM | POA: Diagnosis not present

## 2023-11-13 DIAGNOSIS — I951 Orthostatic hypotension: Secondary | ICD-10-CM | POA: Diagnosis not present

## 2023-11-13 DIAGNOSIS — F0394 Unspecified dementia, unspecified severity, with anxiety: Secondary | ICD-10-CM | POA: Diagnosis not present

## 2023-11-13 DIAGNOSIS — Z9181 History of falling: Secondary | ICD-10-CM | POA: Diagnosis not present

## 2023-11-13 DIAGNOSIS — E1122 Type 2 diabetes mellitus with diabetic chronic kidney disease: Secondary | ICD-10-CM | POA: Diagnosis not present

## 2023-11-13 DIAGNOSIS — N390 Urinary tract infection, site not specified: Secondary | ICD-10-CM | POA: Diagnosis not present

## 2023-11-13 DIAGNOSIS — I728 Aneurysm of other specified arteries: Secondary | ICD-10-CM | POA: Diagnosis not present

## 2023-11-13 DIAGNOSIS — U071 COVID-19: Secondary | ICD-10-CM | POA: Diagnosis not present

## 2023-11-13 DIAGNOSIS — N182 Chronic kidney disease, stage 2 (mild): Secondary | ICD-10-CM | POA: Diagnosis not present

## 2023-11-13 DIAGNOSIS — F4325 Adjustment disorder with mixed disturbance of emotions and conduct: Secondary | ICD-10-CM | POA: Diagnosis not present

## 2023-11-13 DIAGNOSIS — I4891 Unspecified atrial fibrillation: Secondary | ICD-10-CM | POA: Diagnosis not present

## 2023-11-13 DIAGNOSIS — Z6834 Body mass index (BMI) 34.0-34.9, adult: Secondary | ICD-10-CM | POA: Diagnosis not present

## 2023-11-13 DIAGNOSIS — I130 Hypertensive heart and chronic kidney disease with heart failure and stage 1 through stage 4 chronic kidney disease, or unspecified chronic kidney disease: Secondary | ICD-10-CM | POA: Diagnosis not present

## 2023-11-13 DIAGNOSIS — D696 Thrombocytopenia, unspecified: Secondary | ICD-10-CM | POA: Diagnosis not present

## 2023-11-13 DIAGNOSIS — K754 Autoimmune hepatitis: Secondary | ICD-10-CM | POA: Diagnosis not present

## 2023-11-13 NOTE — Transitions of Care (Post Inpatient/ED Visit) (Signed)
 11/13/2023  Name: [name] [name] MRN: [id number] DOB: 1946-06-20  Today's TOC FU Call Status: Today's TOC FU Call Status:: Successful TOC FU Call Completed TOC FU Call Complete Date: 11/13/23 Patient's Name and Date of Birth confirmed.  Transition Care Management Follow-up Telephone Call Date of Discharge: 11/12/23 Discharge Facility: Other ([facility]) Name of Other ([facility]) Discharge Facility: [facility] Type of Discharge: Inpatient Admission How have you been since you were released from the hospital?: Better Any questions or concerns?: No  Items Reviewed: Did you receive and understand the discharge instructions provided?: Yes Medications obtained,verified, and reconciled?: Yes (Medications Reviewed) Any new allergies since your discharge?: No Dietary orders reviewed?: Yes Do you have support at home?: Yes People in Home [RPT]: child(ren), adult  Medications Reviewed Today: Medications Reviewed Today     Reviewed by Darrall Ellison, LPN (Licensed Practical Nurse) on 11/13/23 at [phone]  Med List Status: <None>   Medication Order Taking? Sig Documenting Provider Last Dose Status Informant  albuterol  (PROAIR  HFA) 108 (90 Base) MCG/ACT inhaler [id number] Yes Inhale 2 puffs into the lungs every 6 (six) hours as needed for wheezing or shortness of breath. Judithann Novas, MD Taking Active Self  atenolol  (TENORMIN ) 25 MG tablet [id number] No TAKE 1 TABLET (25 MG TOTAL) BY MOUTH DAILY.  Patient not taking: Reported on 11/13/2023   Avanell Leigh, MD Not Taking Active Self           Med Note Nolan Battle, [facility]   Sat Oct 18, 2023  4:44 AM) Need Refills  cephALEXin  (KEFLEX ) 500 MG capsule [id number] Yes Take 500 mg by mouth 4 (four) times daily. [provider] Taking Active   cetirizine (ZYRTEC) 10 MG tablet [id number] Yes Take 10 mg by mouth daily. [provider] Taking Active Self  Coenzyme Q10 100 MG TABS [id number] Yes Take 100 mg by mouth daily.  [provider] Taking Active Self           Med Note Aubry League, DAWN L   Thu Sep 12, 2022  2:29 PM)    CRANBERRY PO [id number] Yes Take 1,000 mg by mouth in the morning and at bedtime. [provider] Taking Active Self  D-MANNOSE PO [id number] Yes Take 4,000 mg by mouth in the morning and at bedtime. [provider] Taking Active Self  DULoxetine  (CYMBALTA ) 60 MG capsule [id number] Yes Take 1 capsule (60 mg total) by mouth daily. Judithann Novas, MD Taking Active Self  furosemide  (LASIX ) 40 MG tablet [id number] Yes Take 40 mg by mouth daily. [provider] Taking Active Self  hydrOXYzine  (ATARAX ) 25 MG tablet [id number] Yes Take 1 tablet (25 mg total) by mouth 2 (two) times daily as needed for anxiety (irritability). Judithann Novas, MD Taking Active Self  insulin  aspart (NOVOLOG ) 100 UNIT/ML injection [id number] Yes Inject 0-15 Units into the skin 3 (three) times daily with meals. Vann, Jessica U, DO Taking Active   insulin  aspart (NOVOLOG ) 100 UNIT/ML injection [id number] Yes Inject 0-5 Units into the skin at bedtime. Vann, Jessica U, DO Taking Active   insulin  aspart (NOVOLOG ) 100 UNIT/ML injection [id number] Yes Inject 4 Units into the skin 3 (three) times daily with meals. Vann, Jessica U, DO Taking Active   insulin  glargine-yfgn (SEMGLEE ) 100 UNIT/ML injection [id number] Yes Inject 0.14 mLs (14 Units total) into the skin 2 (two) times daily. Vann, Jessica U, DO Taking Active   nitrofurantoin , macrocrystal-monohydrate, (MACROBID ) 100 MG capsule [id number] Yes Take 100 mg by mouth 2 (two)  times daily. [provider] Taking Active   pantoprazole  (PROTONIX ) 40 MG tablet [id number] Yes TAKE 1 TABLET BY MOUTH EVERY DAY Wendling, Shellie Dials, DO Taking Active Self  polyethylene glycol (MIRALAX  / GLYCOLAX ) 17 g packet [id number] Yes Take 17 g by mouth daily as needed for mild constipation. Casey Clay, MD Taking Active Self  predniSONE  (DELTASONE ) 10 MG tablet  [id number] Yes Take 10 mg by mouth daily with breakfast. [provider] Taking Active Self           Med Note Jeb Miner, Laurian Pop   Fri Jun 13, 2023  3:28 PM)    traMADol  (ULTRAM ) 50 MG tablet [id number] Yes Take 1 tablet (50 mg total) by mouth every 8 (eight) hours as needed for severe pain (pain score 7-10). Maggie Schooner, MD Taking Active   UNABLE TO FIND [id number] No Take 1 tablet by mouth in the morning and at bedtime. Med Name: [facility]  Patient not taking: Reported on 11/13/2023   [provider] Not Taking Active Self           Med Note Aubry League, DAWN L   Thu Sep 12, 2022  2:29 PM)              Home Care and Equipment/Supplies: Were Home Health Services Ordered?: Yes Name of Home Health Agency:: [facility] Has Agency set up a time to come to your home?: Yes First Home Health Visit Date: 11/13/23 Any new equipment or medical supplies ordered?: NA  Functional Questionnaire: Do you need assistance with bathing/showering or dressing?: Yes Do you need assistance with meal preparation?: Yes Do you need assistance with eating?: No Do you have difficulty maintaining continence: No Do you need assistance with getting out of bed/getting out of a chair/moving?: No Do you have difficulty managing or taking your medications?: No  Follow up appointments reviewed: PCP Follow-up appointment confirmed?: Yes Date of PCP follow-up appointment?: 11/18/23 Follow-up Provider: [facility] Follow-up appointment confirmed?: Yes Date of Specialist follow-up appointment?: 11/18/23 Follow-Up Specialty Provider:: cardio Do you need transportation to your follow-up appointment?: No Do you understand care options if your condition(s) worsen?: Yes-patient verbalized understanding    SIGNATURE Darrall Ellison, LPN [facility] Nurse Health Advisor Direct Dial [phone]

## 2023-11-18 ENCOUNTER — Encounter: Payer: Self-pay | Admitting: Family Medicine

## 2023-11-18 ENCOUNTER — Ambulatory Visit (INDEPENDENT_AMBULATORY_CARE_PROVIDER_SITE_OTHER): Admitting: Family Medicine

## 2023-11-18 ENCOUNTER — Ambulatory Visit: Attending: Cardiovascular Disease | Admitting: Cardiovascular Disease

## 2023-11-18 ENCOUNTER — Encounter: Payer: Self-pay | Admitting: Cardiovascular Disease

## 2023-11-18 VITALS — BP 130/72 | HR 66 | Ht 63.5 in

## 2023-11-18 VITALS — BP 132/60 | HR 62 | Temp 98.8°F | Ht 63.5 in | Wt 191.2 lb

## 2023-11-18 DIAGNOSIS — I480 Paroxysmal atrial fibrillation: Secondary | ICD-10-CM

## 2023-11-18 DIAGNOSIS — R531 Weakness: Secondary | ICD-10-CM

## 2023-11-18 DIAGNOSIS — N390 Urinary tract infection, site not specified: Secondary | ICD-10-CM | POA: Diagnosis not present

## 2023-11-18 DIAGNOSIS — F331 Major depressive disorder, recurrent, moderate: Secondary | ICD-10-CM | POA: Diagnosis not present

## 2023-11-18 DIAGNOSIS — F419 Anxiety disorder, unspecified: Secondary | ICD-10-CM

## 2023-11-18 DIAGNOSIS — E7800 Pure hypercholesterolemia, unspecified: Secondary | ICD-10-CM | POA: Diagnosis not present

## 2023-11-18 DIAGNOSIS — R002 Palpitations: Secondary | ICD-10-CM | POA: Diagnosis not present

## 2023-11-18 DIAGNOSIS — R454 Irritability and anger: Secondary | ICD-10-CM | POA: Insufficient documentation

## 2023-11-18 DIAGNOSIS — E1165 Type 2 diabetes mellitus with hyperglycemia: Secondary | ICD-10-CM | POA: Diagnosis not present

## 2023-11-18 DIAGNOSIS — R55 Syncope and collapse: Secondary | ICD-10-CM

## 2023-11-18 DIAGNOSIS — R296 Repeated falls: Secondary | ICD-10-CM

## 2023-11-18 LAB — CBC WITH DIFFERENTIAL/PLATELET
Basophils Absolute: 0.1 10*3/uL (ref 0.0–0.1)
Basophils Relative: 0.5 % (ref 0.0–3.0)
Eosinophils Absolute: 0.1 10*3/uL (ref 0.0–0.7)
Eosinophils Relative: 1 % (ref 0.0–5.0)
HCT: 42 % (ref 36.0–46.0)
Hemoglobin: 13.8 g/dL (ref 12.0–15.0)
Lymphocytes Relative: 11 % — ABNORMAL LOW (ref 12.0–46.0)
Lymphs Abs: 1.3 10*3/uL (ref 0.7–4.0)
MCHC: 33 g/dL (ref 30.0–36.0)
MCV: 93 fl (ref 78.0–100.0)
Monocytes Absolute: 0.7 10*3/uL (ref 0.1–1.0)
Monocytes Relative: 6.2 % (ref 3.0–12.0)
Neutro Abs: 9.8 10*3/uL — ABNORMAL HIGH (ref 1.4–7.7)
Neutrophils Relative %: 81.3 % — ABNORMAL HIGH (ref 43.0–77.0)
Platelets: 168 10*3/uL (ref 150.0–400.0)
RBC: 4.51 Mil/uL (ref 3.87–5.11)
RDW: 14.5 % (ref 11.5–15.5)
WBC: 12.1 10*3/uL — ABNORMAL HIGH (ref 4.0–10.5)

## 2023-11-18 LAB — COMPREHENSIVE METABOLIC PANEL WITH GFR
ALT: 60 U/L — ABNORMAL HIGH (ref 0–35)
AST: 47 U/L — ABNORMAL HIGH (ref 0–37)
Albumin: 3.6 g/dL (ref 3.5–5.2)
Alkaline Phosphatase: 197 U/L — ABNORMAL HIGH (ref 39–117)
BUN: 28 mg/dL — ABNORMAL HIGH (ref 6–23)
CO2: 29 meq/L (ref 19–32)
Calcium: 9.9 mg/dL (ref 8.4–10.5)
Chloride: 101 meq/L (ref 96–112)
Creatinine, Ser: 1.29 mg/dL — ABNORMAL HIGH (ref 0.40–1.20)
GFR: 39.97 mL/min — ABNORMAL LOW (ref >60.00)
Glucose, Bld: 258 mg/dL — ABNORMAL HIGH (ref 70–99)
Potassium: 3.7 meq/L (ref 3.5–5.1)
Sodium: 138 meq/L (ref 135–145)
Total Bilirubin: 1.2 mg/dL (ref 0.2–1.2)
Total Protein: 6.4 g/dL (ref 6.0–8.3)

## 2023-11-18 MED ORDER — DULOXETINE HCL 30 MG PO CPEP: 30.0000 mg | ORAL_CAPSULE | Freq: Every day | ORAL | Status: DC

## 2023-11-18 NOTE — Assessment & Plan Note (Signed)
 Now on nitrofurantoin  prophylaxis. Has urologist.schedule follow up with

## 2023-11-18 NOTE — Patient Instructions (Signed)
 Medication Instructions:  Your physician recommends that you continue on your current medications as directed. Please refer to the Current Medication list given to you today.  *If you need a refill on your cardiac medications before your next appointment, please call your pharmacy*   Follow-Up: At [facility], you and your health needs are our priority.  As part of our continuing mission to provide you with exceptional heart care, our providers are all part of one team.  This team includes your primary Cardiologist (physician) and Advanced Practice Providers or APPs (Physician Assistants and Nurse Practitioners) who all work together to provide you with the care you need, when you need it.  Your next appointment:   3 month(s)  Provider:   Hao Meng, PA-C         Then, Lauro Portal, MD will plan to see you again in 6 month(s).    We recommend signing up for the patient portal called "MyChart".  Sign up information is provided on this After Visit Summary.  MyChart is used to connect with patients for Virtual Visits (Telemedicine).  Patients are able to view lab/test results, encounter notes, upcoming appointments, etc.  Non-urgent messages can be sent to your provider as well.   To learn more about what you can do with MyChart, go to [email or web address].

## 2023-11-18 NOTE — Assessment & Plan Note (Signed)
 Acute worsening of chronic issue. Patient states Cymbalta  has not helped with her anger and irritability.  We will try slightly higher dose of Cymbalta  at 90 mg daily.  I doubt recent spells of confusion are related to Cymbalta  as she did not notice initial side effects when she first began taking it over 4 months ago. I have instructed her to stop hydroxyzine  entirely as it could contribute to confusion.  She is not using this regularly.  We will move forward with referral to psychology given the majority of anger issues are in regards to family dynamics.

## 2023-11-18 NOTE — Assessment & Plan Note (Signed)
 Patient was noted to be in PAF during her recent hospitalization.  She is currently wearing a Zio patch.  There was discussion with regards to anticoagulation given her elevated ChadVASc score of 7 however given her propensity to falling it was deemed that she would be high risk for bleeding and therefore a DOAC was not star

## 2023-11-18 NOTE — Assessment & Plan Note (Signed)
 Patient has been experiencing multiple falls over the last 6 months for unclear reasons.  She was recently admitted with this and was found to have COVID and a UTI.  She did have A-fib during her hospitalization and is currently wearing a 30-day Zio patch.  The etiology for her syncope was unclear.

## 2023-11-18 NOTE — Progress Notes (Signed)
 11/18/2023 [name] [name]   04-04-1946  [id number]  Primary Physician Judithann Novas, MD Primary Cardiologist: Avanell Leigh MD [name], [place]  HPI:  [name] [name] is a 78 y.o.   widowed Caucasian female mother of 5 children, grandmother of 11 grandchildren who is retired from doing office work.  I last saw her in the office 01/06/2019.  She is accompanied by one of her granddaughters [place] today.  Prior to that, she had seen Dr. Alroy Aspen and Dr. Stann Earnest . She has a history of obesity, chronic diastolic dysfunction on diuretics with lower extremity edema, palpitations with event monitoring June 2013 showing PACs and PVCs. 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 She does complain of episodic shortness of breath for which she takes bronchodilators on a rescue basis and sees Dr. Linder Revere for this. We saw her 3 months ago we had stopped her verapamil  and transitioned her to atenolol . Apparently metoprolol  was causing her loss she has taken the atenolol  every third day. She's had several episodes of shortness of breath lasting all day improved with inhaled bronchodilators. She also has GERD with reactive airways disease as a result of this.   Since I saw her in the office 5 years ago she was admitted to the hospital 10/17/2023 with syncope.  She did have COVID and a UTI.  She was noted to be in A-fib briefly during the hospitalization.  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 Chancy Comber.  Given her frequent falls it was deemed high risk to put her on oral anticoagulation.  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   Current Meds  Medication Sig   albuterol  (PROAIR  HFA) 108 (90 Base) MCG/ACT inhaler Inhale 2 puffs into the lungs every 6 (six) hours as needed for wheezing or shortness of breath.   atenolol  (TENORMIN ) 25 MG tablet TAKE 1 TABLET (25 MG  TOTAL) BY MOUTH DAILY.   cetirizine (ZYRTEC) 10 MG tablet Take 10 mg by mouth daily.   Coenzyme Q10 100 MG TABS Take 100 mg by mouth daily.   CRANBERRY PO Take 1,000 mg by mouth in the morning and at bedtime.   D-MANNOSE PO Take 4,000 mg by mouth in the morning and at bedtime.   DULoxetine  (CYMBALTA ) 30 MG capsule Take 1 capsule (30 mg total) by mouth daily. Take along with 60 mg for 90 mg total daily.   DULoxetine  (CYMBALTA ) 60 MG capsule Take 1 capsule (60 mg total) by mouth daily.   furosemide  (LASIX ) 40 MG tablet Take 40 mg by mouth daily.   nitrofurantoin , macrocrystal-monohydrate, (MACROBID ) 100 MG capsule Take 100 mg by mouth 2 (two) times daily.   pantoprazole  (PROTONIX ) 40 MG tablet TAKE 1 TABLET BY MOUTH EVERY DAY   polyethylene glycol (MIRALAX  / GLYCOLAX ) 17 g packet Take 17 g by mouth daily as needed for mild constipation.   predniSONE  (DELTASONE ) 10 MG tablet Take 10 mg by mouth daily with breakfast.   traMADol  (ULTRAM ) 50 MG tablet Take 1 tablet (50 mg total) by mouth every 8 (eight) hours as needed for severe pain (pain score 7-10).   UNABLE TO FIND Take 1 tablet by mouth in the morning and at bedtime. Med Name: [name] B Vitamin     Allergies  Allergen Reactions   Codeine  Other (See Comments)  Unknown    Doxycycline  Hives   Epinephrine Hcl Other (See Comments)    Had tremors when it was given.   Erythromycin Ethylsuccinate Other (See Comments)    Abdominal spasms   Eucalyptus Oil Other (See Comments)    Unknown    Iohexol  Hives    Pt needs premedicated-- hives on prev contrast study per md office    Menthol Other (See Comments)    Unknown    Metformin  Diarrhea   Oxycodone  Other (See Comments)    "felt like I was dying"   Tape Rash    Social History   Socioeconomic History   Marital status: Widowed    Spouse name: Not on file   Number of children: 5   Years of education: Not on file   Highest education level: Not on file  Occupational History     Employer: RETIRED  Tobacco Use   Smoking status: Never   Smokeless tobacco: Never  Vaping Use   Vaping status: Never Used  Substance and Sexual Activity   Alcohol use: No    Alcohol/week: 0.0 standard drinks of alcohol   Drug use: No   Sexual activity: Not on file  Other Topics Concern   Not on file  Social History Narrative   Husband treated for testicular cancer, 2 cups coffee a day, no exercise   Social Drivers of Health   Financial Resource Strain: Low Risk  (01/14/2022)   Overall Financial Resource Strain (CARDIA)    Difficulty of Paying Living Expenses: Not very hard  Food Insecurity: No Food Insecurity (10/18/2023)   Hunger Vital Sign    Worried About Running Out of Food in the Last Year: Never true    Ran Out of Food in the Last Year: Never true  Transportation Needs: No Transportation Needs (10/18/2023)   PRAPARE - Administrator, Civil Service (Medical): No    Lack of Transportation (Non-Medical): No  Physical Activity: Inactive (01/14/2022)   Exercise Vital Sign    Days of Exercise per Week: 0 days    Minutes of Exercise per Session: 0 min  Stress: No Stress Concern Present (01/14/2022)   [employer] of Occupational Health - Occupational Stress Questionnaire    Feeling of Stress : Only a little  Social Connections: Moderately Integrated (10/18/2023)   Social Connection and Isolation Panel [NHANES]    Frequency of Communication with Friends and Family: Three times a week    Frequency of Social Gatherings with Friends and Family: Three times a week    Attends Religious Services: More than 4 times per year    Active Member of Clubs or Organizations: Yes    Attends Banker Meetings: More than 4 times per year    Marital Status: Widowed  Intimate Partner Violence: Not At Risk (10/18/2023)   Humiliation, Afraid, Rape, and Kick questionnaire    Fear of Current or Ex-Partner: No    Emotionally Abused: No    Physically Abused: No    Sexually  Abused: No     Review of Systems: General: negative for chills, fever, night sweats or weight changes.  Cardiovascular: negative for chest pain, dyspnea on exertion, edema, orthopnea, palpitations, paroxysmal nocturnal dyspnea or shortness of breath Dermatological: negative for rash Respiratory: negative for cough or wheezing Urologic: negative for hematuria Abdominal: negative for nausea, vomiting, diarrhea, bright red blood per rectum, melena, or hematemesis Neurologic: negative for visual changes, syncope, or dizziness All other systems reviewed and are otherwise negative except  as noted above.    Blood pressure 130/72, pulse 66, height 5' 3.5" (1.613 m), SpO2 97%.  General appearance: alert and no distress Neck: no adenopathy, no carotid bruit, no JVD, supple, symmetrical, trachea midline, and thyroid  not enlarged, symmetric, no tenderness/mass/nodules Lungs: clear to auscultation bilaterally Heart: regular rate and rhythm, S1, S2 normal, no murmur, click, rub or gallop Extremities: extremities normal, atraumatic, no cyanosis or edema Pulses: 2+ and symmetric Skin: Skin color, texture, turgor normal. No rashes or lesions Neurologic: Grossly normal  EKG not performed today      ASSESSMENT AND PLAN:   HYPERCHOLESTEROLEMIA History of hyperlipidemia not on statin therapy with lipid profile performed 04/13/2020 revealed an LDL of 121.  Palpitations History of palpitations in the past with SVT, PACs and PVCs for which I put her on atenolol .  Near syncope Patient has been experiencing multiple falls over the last 6 months for unclear reasons.  She was recently admitted with this and was found to have COVID and a UTI.  She did have A-fib during her hospitalization and is currently wearing a 30-day Zio patch.  The etiology for her syncope was unclear.  PAF (paroxysmal atrial fibrillation) (HCC) Patient was noted to be in PAF during her recent hospitalization.  She is currently  wearing a Zio patch.  There was discussion with regards to anticoagulation given her elevated ChadVASc score of 7 however given her propensity to falling it was deemed that she would be high risk for bleeding and therefore a DOAC was not star     Avanell Leigh MD [facility], [facility] 11/18/2023 4:52 PM

## 2023-11-18 NOTE — Assessment & Plan Note (Signed)
 Chronic, moderate control.  Anger and irritability are likely symptoms of depression.

## 2023-11-18 NOTE — Assessment & Plan Note (Signed)
 History of palpitations in the past with SVT, PACs and PVCs for which I put her on atenolol .

## 2023-11-18 NOTE — Assessment & Plan Note (Signed)
 Recommend continued home physical therapy for deconditioning.

## 2023-11-18 NOTE — Progress Notes (Signed)
 Patient ID: [name] [name], female    DOB: 1946/07/19, 78 y.o.   MRN: [id number]  This visit was conducted in person.  BP 132/60 (BP Location: Left Arm, Patient Position: Sitting, Cuff Size: Normal)   Pulse 62   Temp 98.8 F (37.1 C) (Temporal)   Ht 5' 3.5" (1.613 m)   Wt 191 lb 4 oz (86.8 kg)   SpO2 94%   BMI 33.35 kg/m    CC:  Chief Complaint  Patient presents with   Hospitalization Follow-up    Discharged from [facility] 11/12/2023    Subjective:   HPI: [name] [name] is a 78 y.o. female presenting on 11/18/2023 for Hospitalization Follow-up (Discharged from [facility] 11/12/2023)  Reviewed recent hospital admission summary Admitted October 09, 2023 Discharge date October 24, 2023 Transferred to [facility] for rehab, discharged from rehab November 12, 2023  She was admitted following a fall at home.  She was found to also have COVID-19 and a urinary tract infection.   CT head, cervical, thoracic and chest x-ray were negative for acute pathology.  Bilateral shoulder x-rays right foot, right knee and right hip were negative for fracture or dislocation. Vitamin D  and TSH were normal. She did have evidence of orthostasis and suffers from autonomic dysfunction. Echocardiogram was unremarkable with preserved ejection fraction. COVID infection was treated supportively.  UTI was treated with IV Rocephin .  Physical therapy occupational therapy ordered for weakness.  Currently wearing 30-day Zio patch given episode of A-fib noted during her hospitalization.  She has an appointment with cardiology today.   Outpatient follow-up recommended: CBC and c-Met.  On April 3 MRI brain was ordered for altered mental status and frequent falls: No acute intracranial abnormality, chronic mild small vessel disease   She reports she has kept a UTI since in rehab.. has follow up  with urology next week for recurrent UTIs. In nitrofurantoin .  No further falls since hospitalist. NO further  lightheaded spells.  Now back on atenolol . Has follow up today with cardiology.  Mild swelling... on furosemide  40 mg daily.  Diabetes: Poor control during hospitalization followed by endocrinology. Lab Results  Component Value Date   HGBA1C 7.9 (H) 10/19/2023    She reports that she feels that the Cymbalta  for her mood has cause confusion at times ongoing in last several weeks... has not helped with her mood. She is still irritable. Very angry at times. Very angry at family. Confusion  spells not noted by family.   Only using hydroxyzine  prn, rare.  Relevant past medical, surgical, family and social history reviewed and updated as indicated. Interim medical history since our last visit reviewed. Allergies and medications reviewed and updated. Outpatient Medications Prior to Visit  Medication Sig Dispense Refill   albuterol  (PROAIR  HFA) 108 (90 Base) MCG/ACT inhaler Inhale 2 puffs into the lungs every 6 (six) hours as needed for wheezing or shortness of breath. 8 g 2   atenolol  (TENORMIN ) 25 MG tablet TAKE 1 TABLET (25 MG TOTAL) BY MOUTH DAILY. 90 tablet 0   cetirizine (ZYRTEC) 10 MG tablet Take 10 mg by mouth daily.     Coenzyme Q10 100 MG TABS Take 100 mg by mouth daily.     CRANBERRY PO Take 1,000 mg by mouth in the morning and at bedtime.     D-MANNOSE PO Take 4,000 mg by mouth in the morning and at bedtime.     DULoxetine  (CYMBALTA ) 60 MG capsule Take 1 capsule (60 mg total) by mouth  daily. 90 capsule 3   furosemide  (LASIX ) 40 MG tablet Take 40 mg by mouth daily.     insulin  lispro (HUMALOG) 100 UNIT/ML injection Inject into the skin 3 (three) times daily before meals. (Patient not taking: Reported on 11/18/2023)     nitrofurantoin , macrocrystal-monohydrate, (MACROBID ) 100 MG capsule Take 100 mg by mouth 2 (two) times daily.     pantoprazole  (PROTONIX ) 40 MG tablet TAKE 1 TABLET BY MOUTH EVERY DAY 30 tablet 0   polyethylene glycol (MIRALAX  / GLYCOLAX ) 17 g packet Take 17 g by mouth  daily as needed for mild constipation. 14 each 0   predniSONE  (DELTASONE ) 10 MG tablet Take 10 mg by mouth daily with breakfast.     traMADol  (ULTRAM ) 50 MG tablet Take 1 tablet (50 mg total) by mouth every 8 (eight) hours as needed for severe pain (pain score 7-10). 20 tablet 0   UNABLE TO FIND Take 1 tablet by mouth in the morning and at bedtime. Med Name: Johnson Nanny Vitamin     hydrOXYzine  (ATARAX ) 25 MG tablet Take 1 tablet (25 mg total) by mouth 2 (two) times daily as needed for anxiety (irritability). 20 tablet 0   cephALEXin  (KEFLEX ) 500 MG capsule Take 500 mg by mouth 4 (four) times daily.     insulin  aspart (NOVOLOG ) 100 UNIT/ML injection Inject 0-15 Units into the skin 3 (three) times daily with meals.     insulin  aspart (NOVOLOG ) 100 UNIT/ML injection Inject 0-5 Units into the skin at bedtime.     insulin  aspart (NOVOLOG ) 100 UNIT/ML injection Inject 4 Units into the skin 3 (three) times daily with meals.     insulin  glargine-yfgn (SEMGLEE ) 100 UNIT/ML injection Inject 0.14 mLs (14 Units total) into the skin 2 (two) times daily.     No facility-administered medications prior to visit.     Per HPI unless specifically indicated in ROS section below Review of Systems  Constitutional:  Negative for fatigue and fever.  HENT:  Negative for congestion.   Eyes:  Negative for pain.  Respiratory:  Negative for cough and shortness of breath.   Cardiovascular:  Negative for chest pain, palpitations and leg swelling.  Gastrointestinal:  Negative for abdominal pain.  Genitourinary:  Negative for dysuria and vaginal bleeding.  Musculoskeletal:  Negative for back pain.  Neurological:  Negative for syncope, light-headedness and headaches.  Psychiatric/Behavioral:  Positive for behavioral problems and confusion. Negative for dysphoric mood.    Objective:  BP 132/60 (BP Location: Left Arm, Patient Position: Sitting, Cuff Size: Normal)   Pulse 62   Temp 98.8 F (37.1 C) (Temporal)   Ht 5' 3.5"  (1.613 m)   Wt 191 lb 4 oz (86.8 kg)   SpO2 94%   BMI 33.35 kg/m   Wt Readings from Last 3 Encounters:  11/18/23 191 lb 4 oz (86.8 kg)  10/18/23 187 lb 9.8 oz (85.1 kg)  08/29/23 201 lb 14.4 oz (91.6 kg)      Physical Exam Constitutional:      General: She is not in acute distress.    Appearance: Normal appearance. She is well-developed. She is not ill-appearing or toxic-appearing.     Comments: Using walker for stability  HENT:     Head: Normocephalic.     Right Ear: Hearing, tympanic membrane, ear canal and external ear normal. Tympanic membrane is not erythematous, retracted or bulging.     Left Ear: Hearing, tympanic membrane, ear canal and external ear normal. Tympanic membrane is not erythematous,  retracted or bulging.     Nose: No mucosal edema or rhinorrhea.     Right Sinus: No maxillary sinus tenderness or frontal sinus tenderness.     Left Sinus: No maxillary sinus tenderness or frontal sinus tenderness.     Mouth/Throat:     Pharynx: Uvula midline.  Eyes:     General: Lids are normal. Lids are everted, no foreign bodies appreciated.     Conjunctiva/sclera: Conjunctivae normal.     Pupils: Pupils are equal, round, and reactive to light.  Neck:     Thyroid : No thyroid  mass or thyromegaly.     Vascular: No carotid bruit.     Trachea: Trachea normal.  Cardiovascular:     Rate and Rhythm: Normal rate and regular rhythm.     Pulses: Normal pulses.     Heart sounds: Normal heart sounds, S1 normal and S2 normal. No murmur heard.    No friction rub. No gallop.  Pulmonary:     Effort: Pulmonary effort is normal. No tachypnea or respiratory distress.     Breath sounds: Normal breath sounds. No decreased breath sounds, wheezing, rhonchi or rales.  Abdominal:     General: Bowel sounds are normal.     Palpations: Abdomen is soft.     Tenderness: There is no abdominal tenderness.  Musculoskeletal:     Cervical back: Normal range of motion and neck supple.  Skin:     General: Skin is warm and dry.     Findings: No rash.  Neurological:     Mental Status: She is alert.  Psychiatric:        Mood and Affect: Mood is not anxious or depressed.        Speech: Speech normal.        Behavior: Behavior normal. Behavior is cooperative.        Thought Content: Thought content normal.        Judgment: Judgment normal.       Results for orders placed or performed in visit on 11/18/23  Comprehensive metabolic panel with GFR   Collection Time: 11/18/23 12:02 PM  Result Value Ref Range   Sodium 138 135 - 145 mEq/L   Potassium 3.7 3.5 - 5.1 mEq/L   Chloride 101 96 - 112 mEq/L   CO2 29 19 - 32 mEq/L   Glucose, Bld 258 (H) 70 - 99 mg/dL   BUN 28 (H) 6 - 23 mg/dL   Creatinine, Ser 1.47 (H) 0.40 - 1.20 mg/dL   Total Bilirubin 1.2 0.2 - 1.2 mg/dL   Alkaline Phosphatase 197 (H) 39 - 117 U/L   AST 47 (H) 0 - 37 U/L   ALT 60 (H) 0 - 35 U/L   Total Protein 6.4 6.0 - 8.3 g/dL   Albumin 3.6 3.5 - 5.2 g/dL   GFR 82.95 (L) >62.13 mL/min   Calcium 9.9 8.4 - 10.5 mg/dL  CBC with Differential/Platelet   Collection Time: 11/18/23 12:02 PM  Result Value Ref Range   WBC 12.1 (H) 4.0 - 10.5 K/uL   RBC 4.51 3.87 - 5.11 Mil/uL   Hemoglobin 13.8 12.0 - 15.0 g/dL   HCT 08.6 57.8 - 46.9 %   MCV 93.0 78.0 - 100.0 fl   MCHC 33.0 30.0 - 36.0 g/dL   RDW 62.9 52.8 - 41.3 %   Platelets 168.0 150.0 - 400.0 K/uL   Neutrophils Relative % 81.3 (H) 43.0 - 77.0 %   Lymphocytes Relative 11.0 (L) 12.0 - 46.0 %  Monocytes Relative 6.2 3.0 - 12.0 %   Eosinophils Relative 1.0 0.0 - 5.0 %   Basophils Relative 0.5 0.0 - 3.0 %   Neutro Abs 9.8 (H) 1.4 - 7.7 K/uL   Lymphs Abs 1.3 0.7 - 4.0 K/uL   Monocytes Absolute 0.7 0.1 - 1.0 K/uL   Eosinophils Absolute 0.1 0.0 - 0.7 K/uL   Basophils Absolute 0.1 0.0 - 0.1 K/uL    Assessment and Plan  Weakness generalized Assessment & Plan: Recommend continued home physical therapy for deconditioning.  Orders: -     Comprehensive metabolic  panel with GFR -     CBC with Differential/Platelet  Frequent falls  Poorly controlled type 2 diabetes mellitus (HCC) Assessment & Plan: Chronic, inadequate control followed by endocrinology   Recurrent UTI Assessment & Plan:  Now on nitrofurantoin  prophylaxis. Has urologist.schedule follow up with    Irritability and anger Assessment & Plan: Acute worsening of chronic issue. Patient states Cymbalta  has not helped with her anger and irritability.  We will try slightly higher dose of Cymbalta  at 90 mg daily.  I doubt recent spells of confusion are related to Cymbalta  as she did not notice initial side effects when she first began taking it over 4 months ago. I have instructed her to stop hydroxyzine  entirely as it could contribute to confusion.  She is not using this regularly.  We will move forward with referral to psychology given the majority of anger issues are in regards to family dynamics.  Orders: -     Ambulatory referral to Psychology  Anxiety -     Ambulatory referral to Psychology  MDD (major depressive disorder), recurrent episode, moderate (HCC) Assessment & Plan: Chronic, moderate control.  Anger and irritability are likely symptoms of depression.  Orders: -     Ambulatory referral to Psychology  PAF (paroxysmal atrial fibrillation) [facility]) Assessment & Plan: She is currently wearing the Zio patch for 30 days.  She has an appointment later today with cardiology.   Other orders -     DULoxetine  HCl; Take 1 capsule (30 mg total) by mouth daily. Take along with 60 mg for 90 mg total daily.    No follow-ups on file.   Herby Lolling, MD

## 2023-11-18 NOTE — Assessment & Plan Note (Signed)
 She is currently wearing the Zio patch for 30 days.  She has an appointment later today with cardiology.

## 2023-11-18 NOTE — Assessment & Plan Note (Signed)
 Chronic, inadequate control followed by endocrinology

## 2023-11-18 NOTE — Patient Instructions (Addendum)
 Please stop at the lab to have labs drawn.  Stop hydroxyzine .  Start counselor.  Increase cymbalta  to 90 mg total 60 mg plus 30 mg daily.

## 2023-11-18 NOTE — Assessment & Plan Note (Signed)
 History of hyperlipidemia not on statin therapy with lipid profile performed 04/13/2020 revealed an LDL of 121.

## 2023-12-03 DIAGNOSIS — R42 Dizziness and giddiness: Secondary | ICD-10-CM | POA: Diagnosis not present

## 2023-12-03 DIAGNOSIS — I480 Paroxysmal atrial fibrillation: Secondary | ICD-10-CM

## 2023-12-05 ENCOUNTER — Emergency Department (HOSPITAL_BASED_OUTPATIENT_CLINIC_OR_DEPARTMENT_OTHER): Admitting: Radiology

## 2023-12-05 ENCOUNTER — Encounter (HOSPITAL_BASED_OUTPATIENT_CLINIC_OR_DEPARTMENT_OTHER): Payer: Self-pay | Admitting: Emergency Medicine

## 2023-12-05 ENCOUNTER — Other Ambulatory Visit: Payer: Self-pay

## 2023-12-05 ENCOUNTER — Emergency Department (HOSPITAL_BASED_OUTPATIENT_CLINIC_OR_DEPARTMENT_OTHER)

## 2023-12-05 ENCOUNTER — Emergency Department (HOSPITAL_BASED_OUTPATIENT_CLINIC_OR_DEPARTMENT_OTHER)
Admission: EM | Admit: 2023-12-05 | Discharge: 2023-12-05 | Disposition: A | Discharge status: Home / Self Care | Attending: Emergency Medicine | Admitting: Emergency Medicine

## 2023-12-05 DIAGNOSIS — M8580 Other specified disorders of bone density and structure, unspecified site: Secondary | ICD-10-CM | POA: Diagnosis not present

## 2023-12-05 DIAGNOSIS — S8001XA Contusion of right knee, initial encounter: Secondary | ICD-10-CM | POA: Insufficient documentation

## 2023-12-05 DIAGNOSIS — M25552 Pain in left hip: Secondary | ICD-10-CM | POA: Diagnosis not present

## 2023-12-05 DIAGNOSIS — M25551 Pain in right hip: Secondary | ICD-10-CM | POA: Insufficient documentation

## 2023-12-05 DIAGNOSIS — W2203XA Walked into furniture, initial encounter: Secondary | ICD-10-CM | POA: Insufficient documentation

## 2023-12-05 DIAGNOSIS — S0011XA Contusion of right eyelid and periocular area, initial encounter: Secondary | ICD-10-CM | POA: Insufficient documentation

## 2023-12-05 DIAGNOSIS — M85861 Other specified disorders of bone density and structure, right lower leg: Secondary | ICD-10-CM | POA: Diagnosis not present

## 2023-12-05 DIAGNOSIS — M542 Cervicalgia: Secondary | ICD-10-CM | POA: Diagnosis not present

## 2023-12-05 DIAGNOSIS — Y92000 Kitchen of unspecified non-institutional (private) residence as  the place of occurrence of the external cause: Secondary | ICD-10-CM | POA: Diagnosis not present

## 2023-12-05 DIAGNOSIS — W19XXXA Unspecified fall, initial encounter: Secondary | ICD-10-CM

## 2023-12-05 DIAGNOSIS — S40021A Contusion of right upper arm, initial encounter: Secondary | ICD-10-CM | POA: Insufficient documentation

## 2023-12-05 DIAGNOSIS — M85811 Other specified disorders of bone density and structure, right shoulder: Secondary | ICD-10-CM | POA: Diagnosis not present

## 2023-12-05 DIAGNOSIS — M85862 Other specified disorders of bone density and structure, left lower leg: Secondary | ICD-10-CM | POA: Diagnosis not present

## 2023-12-05 DIAGNOSIS — M25562 Pain in left knee: Secondary | ICD-10-CM | POA: Diagnosis not present

## 2023-12-05 DIAGNOSIS — I6529 Occlusion and stenosis of unspecified carotid artery: Secondary | ICD-10-CM | POA: Diagnosis not present

## 2023-12-05 DIAGNOSIS — M25511 Pain in right shoulder: Secondary | ICD-10-CM | POA: Diagnosis not present

## 2023-12-05 DIAGNOSIS — M47812 Spondylosis without myelopathy or radiculopathy, cervical region: Secondary | ICD-10-CM | POA: Diagnosis not present

## 2023-12-05 DIAGNOSIS — S060XAA Concussion with loss of consciousness status unknown, initial encounter: Secondary | ICD-10-CM | POA: Diagnosis present

## 2023-12-05 DIAGNOSIS — M19071 Primary osteoarthritis, right ankle and foot: Secondary | ICD-10-CM | POA: Diagnosis not present

## 2023-12-05 DIAGNOSIS — Z043 Encounter for examination and observation following other accident: Secondary | ICD-10-CM | POA: Diagnosis not present

## 2023-12-05 DIAGNOSIS — M1712 Unilateral primary osteoarthritis, left knee: Secondary | ICD-10-CM | POA: Diagnosis not present

## 2023-12-05 DIAGNOSIS — M47816 Spondylosis without myelopathy or radiculopathy, lumbar region: Secondary | ICD-10-CM | POA: Diagnosis not present

## 2023-12-05 DIAGNOSIS — R220 Localized swelling, mass and lump, head: Secondary | ICD-10-CM | POA: Diagnosis not present

## 2023-12-05 DIAGNOSIS — Y93K9 Activity, other involving animal care: Secondary | ICD-10-CM | POA: Diagnosis not present

## 2023-12-05 DIAGNOSIS — M25571 Pain in right ankle and joints of right foot: Secondary | ICD-10-CM | POA: Diagnosis not present

## 2023-12-05 DIAGNOSIS — M25561 Pain in right knee: Secondary | ICD-10-CM | POA: Diagnosis not present

## 2023-12-05 DIAGNOSIS — S0511XA Contusion of eyeball and orbital tissues, right eye, initial encounter: Secondary | ICD-10-CM | POA: Diagnosis not present

## 2023-12-05 DIAGNOSIS — M5032 Other cervical disc degeneration, mid-cervical region, unspecified level: Secondary | ICD-10-CM | POA: Diagnosis not present

## 2023-12-05 DIAGNOSIS — M85871 Other specified disorders of bone density and structure, right ankle and foot: Secondary | ICD-10-CM | POA: Diagnosis not present

## 2023-12-05 LAB — CBG MONITORING, ED: Glucose-Capillary: 233 mg/dL — ABNORMAL HIGH (ref 70–99)

## 2023-12-05 MED ORDER — FLUORESCEIN SODIUM 1 MG OP STRP
1.0000 | ORAL_STRIP | Freq: Once | OPHTHALMIC | Status: AC
  Administered 2023-12-05: 1 via OPHTHALMIC
  Filled 2023-12-05: qty 1

## 2023-12-05 MED ORDER — TETRACAINE HCL 0.5 % OP SOLN
2.0000 [drp] | Freq: Once | OPHTHALMIC | Status: AC
  Administered 2023-12-05: 2 [drp] via OPHTHALMIC
  Filled 2023-12-05: qty 4

## 2023-12-05 NOTE — Discharge Instructions (Signed)
 As discussed, your workup today was overall reassuring.  Imaging studies negative for any brain bleed, fracture, dislocation or other abnormality from the fall.  Recommend icing of your eye to help with swelling.  Recommend using heat after a few days or so to help with bruising reabsorption.  You may take Tylenol  for pain.  Please not hesitate to return if the worrisome signs and symptoms we discussed become apparent.

## 2023-12-05 NOTE — ED Provider Notes (Signed)
 [facility] AT [facility] Provider Note   CSN: [id number] Arrival date & time: 12/05/23  1039     History  Chief Complaint  Patient presents with   [place]    [name] [name] is a 78 y.o. female.   Fall   78 year old female presents emergency department after a fall.  States that she was attempting to let her dog out to use the restroom.  States that she opened the door and turned back around.  When she turned, lost balance causing her to fall.  States that she hit the right side of her face on a nearby table and landed on the ground.  Was only on the ground for a matter of a few minutes before getting back to her feet.  States that she feels like she lost consciousness during the head injury aspect but was only for a second or 2.  Currently complaining of headache, pain around her right eye, bilateral shoulder pain, right knee pain.  Denies any blood thinner use.  Denies any chest pain, shortness of breath, abdominal pain.  Past medical history significant for diabetes mellitus, CHF, cirrhosis, dysrhythmia, GERD OSA, renal cell carcinoma  Home Medications Prior to Admission medications   Medication Sig Start Date End Date Taking? Authorizing Provider  albuterol  (PROAIR  HFA) 108 (90 Base) MCG/ACT inhaler Inhale 2 puffs into the lungs every 6 (six) hours as needed for wheezing or shortness of breath. 06/13/23   Bedsole, Amy E, MD  atenolol  (TENORMIN ) 25 MG tablet TAKE 1 TABLET (25 MG TOTAL) BY MOUTH DAILY. 10/10/23   Avanell Leigh, MD  cetirizine (ZYRTEC) 10 MG tablet Take 10 mg by mouth daily.    [provider]  Coenzyme Q10 100 MG TABS Take 100 mg by mouth daily.    [provider]  CRANBERRY PO Take 1,000 mg by mouth in the morning and at bedtime.    [provider]  D-MANNOSE PO Take 4,000 mg by mouth in the morning and at bedtime.    [provider]  DULoxetine  (CYMBALTA ) 30 MG capsule Take 1 capsule (30 mg total)  by mouth daily. Take along with 60 mg for 90 mg total daily. 11/18/23   Bedsole, Amy E, MD  DULoxetine  (CYMBALTA ) 60 MG capsule Take 1 capsule (60 mg total) by mouth daily. 07/11/23   Bedsole, Amy E, MD  furosemide  (LASIX ) 40 MG tablet Take 40 mg by mouth daily.    [provider]  insulin  lispro (HUMALOG) 100 UNIT/ML injection Inject into the skin 3 (three) times daily before meals. Patient not taking: Reported on 11/18/2023    [provider]  nitrofurantoin , macrocrystal-monohydrate, (MACROBID ) 100 MG capsule Take 100 mg by mouth 2 (two) times daily.    [provider]  pantoprazole  (PROTONIX ) 40 MG tablet TAKE 1 TABLET BY MOUTH EVERY DAY 09/17/19   Wendling, Shellie Dials, DO  polyethylene glycol (MIRALAX  / GLYCOLAX ) 17 g packet Take 17 g by mouth daily as needed for mild constipation. 06/09/23   Hongalgi, Anand D, MD  predniSONE  (DELTASONE ) 10 MG tablet Take 10 mg by mouth daily with breakfast.    [provider]  traMADol  (ULTRAM ) 50 MG tablet Take 1 tablet (50 mg total) by mouth every 8 (eight) hours as needed for severe pain (pain score 7-10). 10/21/23   Amin, Ankit C, MD  UNABLE TO FIND Take 1 tablet by mouth in the morning and at bedtime. Med Name: [employer] Nanny Vitamin  [provider]      Allergies    Codeine , Doxycycline , Epinephrine hcl, Erythromycin ethylsuccinate, Eucalyptus oil, Iohexol , Menthol, Metformin , Oxycodone , and Tape    Review of Systems   Review of Systems  All other systems reviewed and are negative.   Physical Exam Updated Vital Signs BP 128/72   Pulse (!) 54   Temp 98.1 F (36.7 C) (Oral)   Resp 15   Ht 5' 3.5" (1.613 m)   Wt 83 kg   SpO2 99%   BMI 31.91 kg/m  Physical Exam Vitals and nursing note reviewed.  Constitutional:      General: She is not in acute distress.    Appearance: She is well-developed.  HENT:     Head: Normocephalic.     Comments: Ecchymosis appreciated right upper eyelid.  Superficial  abrasion appreciated right lower eyelid. Eyes:     Intraocular pressure: Right eye pressure is 18 mmHg. Measurements were taken using a handheld tonometer.    Conjunctiva/sclera: Conjunctivae normal.     Comments: See nurses note for visual acuity.  Reportedly 25/20 right eye, 20/20 left eye.  No fluorescein  uptake.  Cardiovascular:     Rate and Rhythm: Normal rate and regular rhythm.     Heart sounds: No murmur heard. Pulmonary:     Effort: Pulmonary effort is normal. No respiratory distress.     Breath sounds: Normal breath sounds.  Abdominal:     Palpations: Abdomen is soft.     Tenderness: There is no abdominal tenderness.  Musculoskeletal:        General: No swelling.     Cervical back: Neck supple.     Comments: Midline tenderness cervical spine with left greater than right paraspinal tenderness in cervical region.  No midline tenderness thoracic and lumbar spine without step-off deformity.  No chest wall tenderness.  Patient with area of ecchymosis right proximal humerus with overlying tenderness.  Tender palpation left proximal humerus.  Otherwise, no tenderness palpation of bilateral upper extremities.  Patient with tenderness appreciated bilateral hips.  Swelling as well as ecchymosis appreciated right anterior knee.  Tender palpation right medial malleolus of ankle.  Otherwise, no appreciable tenderness bilateral lower extremities.  Pedal and radial pulses 2+ bilaterally.  Skin:    General: Skin is warm and dry.     Capillary Refill: Capillary refill takes less than 2 seconds.  Neurological:     Mental Status: She is alert.     Comments: Alert and oriented to self, place, time and event.   Speech is fluent, clear without dysarthria or dysphasia.   Strength symmetric in upper/lower extremities   Sensation intact in upper/lower extremities   CN I not tested  CN II not tested  CN III, IV, VI PERRLA and EOMs intact bilaterally  CN V Intact sensation to sharp and light touch to  the face  CN VII facial movements symmetric  CN VIII not tested  CN IX, X no uvula deviation, symmetric rise of soft palate  CN XI symmetric SCM and trapezius strength bilaterally  CN XII Midline tongue protrusion, symmetric L/R movements     Psychiatric:        Mood and Affect: Mood normal.     ED Results / Procedures / Treatments   Labs (all labs ordered are listed, but only abnormal results are displayed) Labs Reviewed  CBG MONITORING, ED - Abnormal; Notable for the following components:      Result Value   Glucose-Capillary 233 (*)  All other components within normal limits    EKG None  Radiology CT Cervical Spine Wo Contrast Result Date: 12/05/2023 CLINICAL DATA:  78 year old female status post fall striking head and face on table. EXAM: CT CERVICAL SPINE WITHOUT CONTRAST TECHNIQUE: Multidetector CT imaging of the cervical spine was performed without intravenous contrast. Multiplanar CT image reconstructions were also generated. RADIATION DOSE REDUCTION: This exam was performed according to the departmental dose-optimization program which includes automated exposure control, adjustment of the mA and/or kV according to patient size and/or use of iterative reconstruction technique. COMPARISON:  Head and face today reported separately. Cervical spine CT 10/18/2023. FINDINGS: Alignment: Mildly improved cervical lordosis. Continued straightening of the lower cervical spine. Cervicothoracic junction alignment is within normal limits. Bilateral posterior element alignment is within normal limits. Skull base and vertebrae: Visualized skull base is intact. No atlanto-occipital dissociation. C1 and C2 appear intact and aligned. No acute osseous abnormality identified. Soft tissues and spinal canal: No prevertebral fluid or swelling. No visible canal hematoma. Negative visible noncontrast neck soft tissues. Disc levels: Cervical spine degeneration with left side upper cervical facet  arthropathy and lower cervical disc and endplate degeneration is stable from March. Upper chest: Visible upper thoracic levels appears stable and intact. Negative lung apices. IMPRESSION: 1. No acute traumatic injury identified in the cervical spine. 2. Stable chronic cervical spine degeneration. Electronically Signed   By: Marlise Simpers M.D.   On: 12/05/2023 12:30   CT Maxillofacial Wo Contrast Result Date: 12/05/2023 CLINICAL DATA:  78 year old female status post fall striking head and face on table. EXAM: CT MAXILLOFACIAL WITHOUT CONTRAST TECHNIQUE: Multidetector CT imaging of the maxillofacial structures was performed. Multiplanar CT image reconstructions were also generated. RADIATION DOSE REDUCTION: This exam was performed according to the departmental dose-optimization program which includes automated exposure control, adjustment of the mA and/or kV according to patient size and/or use of iterative reconstruction technique. COMPARISON:  Head and cervical spine CT today. FINDINGS: Osseous: Mandible intact and normally located. No acute dental finding. Bilateral maxilla, zygoma, pterygoid, nasal bones appear intact. Central skull base and cervical vertebrae appear aligned. Visible frontal bones intact. Orbits: No orbital wall fracture. Globes appear symmetric and intact. Intraorbital soft tissues are normal. There is right periorbital, there is right periorbital, preseptal space hematoma and contusion continuing into the right forehead. No soft tissue gas. Sinuses: Visualized paranasal sinuses and mastoids are clear. Soft tissues: Negative visible noncontrast larynx, pharynx, parapharyngeal spaces, retropharyngeal space, sublingual space, submandibular spaces, masticator and parotid spaces. Mild calcified carotid atherosclerosis in the neck. Limited intracranial: Stable to that reported separately. IMPRESSION: Right periorbital and forehead soft tissue injury. No facial fracture identified. Electronically Signed    By: Marlise Simpers M.D.   On: 12/05/2023 12:28   CT Head Wo Contrast Result Date: 12/05/2023 CLINICAL DATA:  78 year old female status post fall striking head and face on table. EXAM: CT HEAD WITHOUT CONTRAST TECHNIQUE: Contiguous axial images were obtained from the base of the skull through the vertex without intravenous contrast. RADIATION DOSE REDUCTION: This exam was performed according to the departmental dose-optimization program which includes automated exposure control, adjustment of the mA and/or kV according to patient size and/or use of iterative reconstruction technique. COMPARISON:  Brain MRI 10/23/2023 and earlier. CT face and cervical spine today reported separately. FINDINGS: Brain: Stable cerebral volume. No midline shift, ventriculomegaly, mass effect, evidence of mass lesion, intracranial hemorrhage or evidence of cortically based acute infarction. Stable gray-white differentiation, stable to the findings on April brain MRI.  Vascular: No suspicious intracranial vascular hyperdensity. Skull: Stable. No acute fracture identified. Face reported separately. Sinuses/Orbits: Visualized paranasal sinuses and mastoids are stable and well aerated. Other: Right forehead, periorbital soft tissue swelling, hematoma. Globes and intraorbital soft tissues appear to remain normal. No scalp soft tissue gas. IMPRESSION: 1. Right forehead and periorbital soft tissue injury. No skull fracture identified. Face CT reported separately. 2. No acute intracranial abnormality, stable from brain MRI last month. Electronically Signed   By: Marlise Simpers M.D.   On: 12/05/2023 12:25   DG HIP UNILAT WITH PELVIS 2-3 VIEWS RIGHT Result Date: 12/05/2023 CLINICAL DATA:  Pain. EXAM: DG HIP (WITH OR WITHOUT PELVIS) 2-3V RIGHT COMPARISON:  None Available. FINDINGS: No acute fracture or dislocation. The bones are osteopenic. Degenerative changes of the lumen spine. The soft tissues are unremarkable. IMPRESSION: 1. No acute fracture or  dislocation. 2. Osteopenia. Electronically Signed   By: Angus Bark M.D.   On: 12/05/2023 12:11   DG HIP UNILAT WITH PELVIS 2-3 VIEWS LEFT Result Date: 12/05/2023 CLINICAL DATA:  Left hip pain. EXAM: DG HIP (WITH OR WITHOUT PELVIS) 2-3V LEFT COMPARISON:  None Available. FINDINGS: There is no acute fracture or dislocation. The bones are osteopenic. Degenerative changes of the lower lumbar spine. The soft tissues are unremarkable. IMPRESSION: 1. No acute fracture or dislocation. 2. Osteopenia. Electronically Signed   By: Angus Bark M.D.   On: 12/05/2023 12:10   DG Foot Complete Right Result Date: 12/05/2023 CLINICAL DATA:  Right hip pain. EXAM: RIGHT FOOT COMPLETE - 3+ VIEW COMPARISON:  None Available. FINDINGS: There is no acute fracture or dislocation. The bones are osteopenic. Calcaneal fixation screws. Degenerative changes of the head of the first metatarsal. The soft tissues are unremarkable. IMPRESSION: 1. No acute fracture or dislocation. 2. Osteopenia. Electronically Signed   By: Angus Bark M.D.   On: 12/05/2023 12:09   DG Knee Complete 4 Views Left Result Date: 12/05/2023 CLINICAL DATA:  Left knee pain. EXAM: LEFT KNEE - COMPLETE 4+ VIEW COMPARISON:  None Available. FINDINGS: There is no acute fracture or dislocation. The bones are osteopenic. Mild arthritic changes. No joint effusion. Vascular calcification. The soft tissues are unremarkable. IMPRESSION: 1. No acute fracture or dislocation. 2. Mild arthritic changes. Electronically Signed   By: Angus Bark M.D.   On: 12/05/2023 12:08   DG Shoulder Right Result Date: 12/05/2023 CLINICAL DATA:  Right shoulder pain. EXAM: RIGHT SHOULDER - 2+ VIEW COMPARISON:  Radiograph dated 10/18/2023 FINDINGS: No acute fracture or dislocation. The bones are osteopenic. Stable postsurgical changes of the distal right clavicle. Probable subcutaneous contusion of the lateral right shoulder. IMPRESSION: 1. No acute fracture or dislocation. 2.  Probable subcutaneous contusion of the lateral right shoulder. Electronically Signed   By: Angus Bark M.D.   On: 12/05/2023 12:08   DG Knee Complete 4 Views Right Result Date: 12/05/2023 CLINICAL DATA:  Right knee pain. EXAM: RIGHT KNEE - COMPLETE 4+ VIEW COMPARISON:  None Available. FINDINGS: No acute fracture or dislocation. The bones are osteopenic. No significant arthritic changes. No joint effusion. Vascular calcification. The soft tissues are unremarkable. IMPRESSION: 1. No acute fracture or dislocation. 2. Osteopenia. Electronically Signed   By: Angus Bark M.D.   On: 12/05/2023 12:06    Procedures Procedures    Medications Ordered in ED Medications  fluorescein  ophthalmic strip 1 strip (1 strip Both Eyes Given 12/05/23 1111)  tetracaine  (PONTOCAINE) 0.5 % ophthalmic solution 2 drop (2 drops Both Eyes Given 12/05/23 1111)  ED Course/ Medical Decision Making/ A&P Clinical Course as of 12/05/23 1546  Fri Dec 05, 2023  1352 DG HIP UNILAT WITH PELVIS 2-3 VIEWS LEFT [CR]    Clinical Course User Index [CR] [place], [place]                                 Medical Decision Making Amount and/or Complexity of Data Reviewed Radiology: ordered. Decision-making details documented in ED Course.  Risk Prescription drug management.   This patient presents to the ED for concern of fall, this involves an extensive number of treatment options, and is a complaint that carries with it a high risk of complications and morbidity.  The differential diagnosis includes fracture, strain/sprain, dislocation, CVA, ligament/tendinous injury, vascular,'s, pneumothorax, solid organ damage, other   Co morbidities that complicate the patient evaluation  See HPI   Additional history obtained:  Additional history obtained from EMR External records from outside source obtained and reviewed including hospital records   Lab Tests:  N/a   Imaging Studies ordered:  I ordered  imaging studies including CT head/cervical spine/maxillofacial, bilateral shoulder x-ray, bilateral knee x-ray, pelvis with bilateral hip x-ray, right ankle x-ray I independently visualized and interpreted imaging which showed  CT head/cervical spine/maxillofacial: No acute abnormality he Right shoulder x-ray: No acute abnormality Bilateral knee x-ray: No acute abnormality Pelvis with bilateral hip x-ray: No acute abnormality Right foot x-ray: No acute abnormality I agree with the radiologist interpretation   Cardiac Monitoring: / EKG:  The patient was maintained on a cardiac monitor.  I personally viewed and interpreted the cardiac monitored which showed an underlying rhythm of: Sinus rhythm   Consultations Obtained:  N/a   Problem List / ED Course / Critical interventions / Medication management  Fall I ordered medication including tetracaine , fluorescein   f  Reevaluation of the patient after these medicines showed that the patient improved I have reviewed the patients home medicines and have made adjustments as needed   Social Determinants of Health:  Denies tobacco, licit drug use.   Test / Admission - Considered:  Fall Vitals signs within normal range and stable throughout visit. Laboratory/imaging studies significant for: See above 78 year old female presents emergency department after a fall.  Fall described as mechanical.  States that she was attempting to let her dog out to use the restroom.  States that she opened the door and turned back around.  When she turned, lost balance causing her to fall.  States that she hit the right side of her face on a nearby table and landed on the ground.  Was only on the ground for a matter of a few minutes before getting back to her feet.  States that she feels like she lost consciousness during the head injury aspect but was only for a second or 2.  Currently complaining of headache, pain around her right eye, bilateral shoulder  pain, right knee pain.  Denies any blood thinner use.  Denies any chest pain, shortness of breath, abdominal pain. On exam, ecchymosis appreciated around right eye.  Appreciable reproducible tenderness of bilateral hips, right shoulder, bilateral knee, right foot as above.  Imaging studies reassuring without evidence of acute traumatic injury.  Regarding right eye, IOP's normal, visual acuity normal, fluorescein  stain showed no uptake.  Will recommend symptomatic therapy as described in AVS with close follow-up with PCP in the outpatient setting.  Treatment plan discussed with patient and  she was understanding was agreeable to said plan.  Patient overall well-appearing, afebrile in no acute distress. Worrisome signs and symptoms were discussed with the patient, and the patient acknowledged understanding to return to the ED if noticed. Patient was stable upon discharge.          Final Clinical Impression(s) / ED Diagnoses Final diagnoses:  None    Rx / DC Orders ED Discharge Orders     None         O'Brien Butter, [place] 12/05/23 1546    Flonnie Humphrey, DO 12/06/23 0900

## 2023-12-05 NOTE — ED Triage Notes (Signed)
 Pt caox4 in wheelchair reporting mechanical fall at home this morning causing her to hit her face on the kitchen table. Pt c/o pain in R side of face, R knee, L hip and R upper arm. Bruising and swelling to R eye lid and R upper arm. Pt does not take blood thinner med.

## 2023-12-05 NOTE — ED Notes (Signed)
 Patient transported to X-ray

## 2023-12-09 ENCOUNTER — Ambulatory Visit: Payer: Self-pay | Admitting: Student

## 2023-12-10 DIAGNOSIS — E1065 Type 1 diabetes mellitus with hyperglycemia: Secondary | ICD-10-CM | POA: Diagnosis not present

## 2023-12-29 NOTE — Telephone Encounter (Signed)
 Pt's daughter returning call.

## 2024-01-01 NOTE — Telephone Encounter (Signed)
 Pt daughter returning call

## 2024-01-09 DIAGNOSIS — E1165 Type 2 diabetes mellitus with hyperglycemia: Secondary | ICD-10-CM | POA: Diagnosis not present

## 2024-01-13 DIAGNOSIS — M47816 Spondylosis without myelopathy or radiculopathy, lumbar region: Secondary | ICD-10-CM | POA: Diagnosis not present

## 2024-01-15 ENCOUNTER — Other Ambulatory Visit: Payer: Self-pay | Admitting: Cardiovascular Disease

## 2024-01-29 DIAGNOSIS — M47896 Other spondylosis, lumbar region: Secondary | ICD-10-CM | POA: Diagnosis not present

## 2024-01-29 DIAGNOSIS — M7918 Myalgia, other site: Secondary | ICD-10-CM | POA: Diagnosis not present

## 2024-02-11 ENCOUNTER — Ambulatory Visit (HOSPITAL_COMMUNITY)

## 2024-02-11 ENCOUNTER — Encounter (HOSPITAL_COMMUNITY): Payer: Self-pay

## 2024-02-11 ENCOUNTER — Ambulatory Visit: Payer: Self-pay | Admitting: *Deleted

## 2024-02-11 NOTE — Telephone Encounter (Signed)
 Please call for more information.  She will have to be seen before medication can be prescribed especially since Dr. Avelina is out of the office.

## 2024-02-11 NOTE — Telephone Encounter (Signed)
 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 Pt is in constant pain now. Rico is at work but she said pain level is bad because pt was crying while talking with [place] on phone.pain seems worse today. 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 pt last seen on 11/18/23. Explained pt needed eval and testing; no available appts at [facility] this afternoon or tomorrow and if pt is hurting that badly pt should go to ED for eval and testing. [name] voiced understanding and she will talk with pt and Rico said she will try and get her mom to go to [facility] ED. Dr Avelina is out of office and  sending note to Dr Watt.

## 2024-02-11 NOTE — Telephone Encounter (Signed)
 I agree with plan of care.  It sounds as if the patient needs urgent evaluation for acute abdominal pain.

## 2024-02-11 NOTE — Telephone Encounter (Signed)
 FYI Only or Action Required?: Action required by provider: medication refill request.  Patient was last seen in primary care on 11/18/2023 by [name] Greig [name], MD.  Called Nurse Triage reporting Pain.  Symptoms began today.  Interventions attempted: Nothing.  Symptoms are: gradually worsening.  Triage Disposition: See HCP Within 4 Hours (Or PCP Triage)  Patient/caregiver understands and will follow disposition?: No, wishes to speak with PCP              Reason for Disposition  [1] MILD-MODERATE pain AND [2] constant AND [3] present > 2 hours  Answer Assessment - Initial Assessment Questions Recommended ED due to sx and no available appt with PCP until July 31. Patient daughter reports patient will declined ED and will not feel she can come to an appt. Shaking chills now. Requesting medication for diverticulitis flare up . Patient daughter requesting call back      1. LOCATION: Where does it hurt?      Lower abdomen , into back per patient daughter  2. RADIATION: Does the pain shoot anywhere else? (e.g., chest, back)     na 3. ONSET: When did the pain begin? (e.g., minutes, hours or days ago)      Today worsening pain  4. SUDDEN: Gradual or sudden onset?     Gradual back pain few days  5. PATTERN Does the pain come and go, or is it constant?     Constant  6. SEVERITY: How bad is the pain?  (e.g., Scale 1-10; mild, moderate, or severe)     Hurting too bad to complete CT scan scheduled for today  7. RECURRENT SYMPTOM: Have you ever had this type of stomach pain before? If Yes, ask: When was the last time? and What happened that time?      Yes hx diverticulitis 8. CAUSE: What do you think is causing the stomach pain? (e.g., gallstones, recent abdominal surgery)     Diverticulitis flare up  9. RELIEVING/AGGRAVATING FACTORS: What makes it better or worse? (e.g., antacids, bending or twisting motion, bowel movement)     Nothing  10. OTHER  SYMPTOMS: Do you have any other symptoms? (e.g., back pain, diarrhea, fever, urination pain, vomiting)       Shaking likes chills, taking macrobid  daily to prevent UTi, abdominal pain belly hurts. No diarrhea no N/V  11. PREGNANCY: Is there any chance you are pregnant? When was your last menstrual period?       na  Protocols used: Abdominal Pain - Female-A-AH

## 2024-02-12 ENCOUNTER — Ambulatory Visit: Admitting: Physician Assistant

## 2024-02-12 NOTE — Telephone Encounter (Signed)
 I spoke with [name] [name] (DPR signed) when [name] [name] got off work on 02/11/24 she went to her mom and pt was not running fever and pt was not complaining of abd pain., pt was having some back pain upon movement and [name] [name] is trying to get appt with back dr. Almeta did not go to UC or ED on 02/11/24. Offered [name] [name] an appt for pt at [facility] this morning but [name] [name] said pt said she was not going anywhere. [name] [name] said she is monitoring pt condition and if condition worsens she will force her mom to go to ED. [name] [name] appreciated F/U call. Sending FYI to Dr Watt.

## 2024-02-18 ENCOUNTER — Encounter: Payer: Self-pay | Admitting: Physician Assistant

## 2024-02-18 ENCOUNTER — Telehealth: Payer: Self-pay | Admitting: Hematology and Oncology

## 2024-02-24 DIAGNOSIS — M7918 Myalgia, other site: Secondary | ICD-10-CM | POA: Diagnosis not present

## 2024-02-25 ENCOUNTER — Other Ambulatory Visit: Payer: PPO

## 2024-02-25 ENCOUNTER — Ambulatory Visit: Payer: PPO | Admitting: Hematology and Oncology

## 2024-02-25 ENCOUNTER — Telehealth: Payer: Self-pay | Admitting: *Deleted

## 2024-02-25 NOTE — Telephone Encounter (Signed)
 Received vm message from pt's daughter, Joen. She is asking about pre-meds prior to pt's CT scan tomorrow as she is allergic to the IV contrast. TCT Joen and spoke with her. Reviewed the protocol with her for Prednisone  50 mg @ 13, 7 and 1 hour prior to scan, along with Benadryl  50 mg 1 hour prior to scan. She does not need a prescription as her mother has prednisone  10 mg tabs at home.She voices understanding to take 5 tablets at the above time frame. No other questions or concerns.

## 2024-02-26 ENCOUNTER — Ambulatory Visit (HOSPITAL_COMMUNITY)
Admission: RE | Admit: 2024-02-26 | Discharge: 2024-02-26 | Disposition: A | Discharge status: Home / Self Care | Source: Ambulatory Visit | Attending: Physician Assistant

## 2024-02-26 ENCOUNTER — Inpatient Hospital Stay (HOSPITAL_COMMUNITY)
Admission: EM | Admit: 2024-02-26 | Discharge: 2024-02-28 | DRG: 175 | Disposition: A | Discharge status: Home / Self Care | Attending: Internal Medicine | Admitting: Internal Medicine

## 2024-02-26 ENCOUNTER — Other Ambulatory Visit: Payer: Self-pay

## 2024-02-26 ENCOUNTER — Encounter (HOSPITAL_COMMUNITY): Payer: Self-pay

## 2024-02-26 DIAGNOSIS — R296 Repeated falls: Secondary | ICD-10-CM | POA: Diagnosis present

## 2024-02-26 DIAGNOSIS — R918 Other nonspecific abnormal finding of lung field: Secondary | ICD-10-CM | POA: Diagnosis present

## 2024-02-26 DIAGNOSIS — E1165 Type 2 diabetes mellitus with hyperglycemia: Secondary | ICD-10-CM | POA: Diagnosis not present

## 2024-02-26 DIAGNOSIS — Z85528 Personal history of other malignant neoplasm of kidney: Secondary | ICD-10-CM

## 2024-02-26 DIAGNOSIS — Z79899 Other long term (current) drug therapy: Secondary | ICD-10-CM

## 2024-02-26 DIAGNOSIS — Z832 Family history of diseases of the blood and blood-forming organs and certain disorders involving the immune mechanism: Secondary | ICD-10-CM

## 2024-02-26 DIAGNOSIS — K754 Autoimmune hepatitis: Secondary | ICD-10-CM | POA: Diagnosis present

## 2024-02-26 DIAGNOSIS — Z91199 Patient's noncompliance with other medical treatment and regimen due to unspecified reason: Secondary | ICD-10-CM

## 2024-02-26 DIAGNOSIS — N1831 Chronic kidney disease, stage 3a: Secondary | ICD-10-CM | POA: Diagnosis present

## 2024-02-26 DIAGNOSIS — Z905 Acquired absence of kidney: Secondary | ICD-10-CM | POA: Diagnosis not present

## 2024-02-26 DIAGNOSIS — D696 Thrombocytopenia, unspecified: Secondary | ICD-10-CM | POA: Diagnosis present

## 2024-02-26 DIAGNOSIS — I2699 Other pulmonary embolism without acute cor pulmonale: Secondary | ICD-10-CM | POA: Diagnosis not present

## 2024-02-26 DIAGNOSIS — J9601 Acute respiratory failure with hypoxia: Secondary | ICD-10-CM | POA: Diagnosis present

## 2024-02-26 DIAGNOSIS — M545 Low back pain, unspecified: Secondary | ICD-10-CM | POA: Diagnosis not present

## 2024-02-26 DIAGNOSIS — E669 Obesity, unspecified: Secondary | ICD-10-CM | POA: Diagnosis present

## 2024-02-26 DIAGNOSIS — E1142 Type 2 diabetes mellitus with diabetic polyneuropathy: Secondary | ICD-10-CM | POA: Diagnosis present

## 2024-02-26 DIAGNOSIS — Z825 Family history of asthma and other chronic lower respiratory diseases: Secondary | ICD-10-CM

## 2024-02-26 DIAGNOSIS — Z87440 Personal history of urinary (tract) infections: Secondary | ICD-10-CM

## 2024-02-26 DIAGNOSIS — Z9049 Acquired absence of other specified parts of digestive tract: Secondary | ICD-10-CM | POA: Diagnosis not present

## 2024-02-26 DIAGNOSIS — I2609 Other pulmonary embolism with acute cor pulmonale: Principal | ICD-10-CM | POA: Diagnosis present

## 2024-02-26 DIAGNOSIS — I2602 Saddle embolus of pulmonary artery with acute cor pulmonale: Secondary | ICD-10-CM | POA: Diagnosis not present

## 2024-02-26 DIAGNOSIS — Z860100 Personal history of colon polyps, unspecified: Secondary | ICD-10-CM

## 2024-02-26 DIAGNOSIS — E1122 Type 2 diabetes mellitus with diabetic chronic kidney disease: Secondary | ICD-10-CM | POA: Diagnosis present

## 2024-02-26 DIAGNOSIS — Z794 Long term (current) use of insulin: Secondary | ICD-10-CM

## 2024-02-26 DIAGNOSIS — Z90711 Acquired absence of uterus with remaining cervical stump: Secondary | ICD-10-CM

## 2024-02-26 DIAGNOSIS — J4520 Mild intermittent asthma, uncomplicated: Secondary | ICD-10-CM | POA: Diagnosis present

## 2024-02-26 DIAGNOSIS — C642 Malignant neoplasm of left kidney, except renal pelvis: Secondary | ICD-10-CM | POA: Insufficient documentation

## 2024-02-26 DIAGNOSIS — K219 Gastro-esophageal reflux disease without esophagitis: Secondary | ICD-10-CM | POA: Diagnosis present

## 2024-02-26 DIAGNOSIS — I480 Paroxysmal atrial fibrillation: Secondary | ICD-10-CM | POA: Diagnosis present

## 2024-02-26 DIAGNOSIS — I5032 Chronic diastolic (congestive) heart failure: Secondary | ICD-10-CM | POA: Diagnosis present

## 2024-02-26 DIAGNOSIS — W44F9XA Other object of natural or organic material, entering into or through a natural orifice, initial encounter: Secondary | ICD-10-CM | POA: Diagnosis present

## 2024-02-26 DIAGNOSIS — Z6833 Body mass index (BMI) 33.0-33.9, adult: Secondary | ICD-10-CM

## 2024-02-26 DIAGNOSIS — G8929 Other chronic pain: Secondary | ICD-10-CM | POA: Diagnosis present

## 2024-02-26 DIAGNOSIS — K7460 Unspecified cirrhosis of liver: Secondary | ICD-10-CM | POA: Diagnosis present

## 2024-02-26 DIAGNOSIS — Z833 Family history of diabetes mellitus: Secondary | ICD-10-CM

## 2024-02-26 DIAGNOSIS — C649 Malignant neoplasm of unspecified kidney, except renal pelvis: Secondary | ICD-10-CM | POA: Diagnosis present

## 2024-02-26 DIAGNOSIS — R748 Abnormal levels of other serum enzymes: Secondary | ICD-10-CM | POA: Diagnosis present

## 2024-02-26 DIAGNOSIS — E7800 Pure hypercholesterolemia, unspecified: Secondary | ICD-10-CM | POA: Diagnosis present

## 2024-02-26 DIAGNOSIS — Z87442 Personal history of urinary calculi: Secondary | ICD-10-CM

## 2024-02-26 DIAGNOSIS — N390 Urinary tract infection, site not specified: Secondary | ICD-10-CM | POA: Diagnosis present

## 2024-02-26 DIAGNOSIS — R000 Tachycardia, unspecified: Secondary | ICD-10-CM | POA: Diagnosis not present

## 2024-02-26 DIAGNOSIS — Z9641 Presence of insulin pump (external) (internal): Secondary | ICD-10-CM | POA: Diagnosis present

## 2024-02-26 DIAGNOSIS — T17890A Other foreign object in other parts of respiratory tract causing asphyxiation, initial encounter: Secondary | ICD-10-CM | POA: Diagnosis present

## 2024-02-26 LAB — CBC
HCT: 43.4 % (ref 36.0–46.0)
Hemoglobin: 13.9 g/dL (ref 12.0–15.0)
MCH: 29.3 pg (ref 26.0–34.0)
MCHC: 32 g/dL (ref 30.0–36.0)
MCV: 91.4 fL (ref 80.0–100.0)
Platelets: 126 K/uL — ABNORMAL LOW (ref 150–400)
RBC: 4.75 MIL/uL (ref 3.87–5.11)
RDW: 13.4 % (ref 11.5–15.5)
WBC: 3 K/uL — ABNORMAL LOW (ref 4.0–10.5)
nRBC: 0 % (ref 0.0–0.2)

## 2024-02-26 LAB — URINALYSIS, ROUTINE W REFLEX MICROSCOPIC
Bilirubin Urine: NEGATIVE
Glucose, UA: >=500 mg/dL — AB
Ketones, ur: 5 mg/dL — AB
Leukocytes,Ua: NEGATIVE
Nitrite: NEGATIVE
Protein, ur: NEGATIVE mg/dL
Specific Gravity, Urine: >1.046 — ABNORMAL HIGH (ref 1.005–1.030)
pH: 5 (ref 5.0–8.0)

## 2024-02-26 LAB — GLUCOSE, CAPILLARY: Glucose-Capillary: 348 mg/dL — ABNORMAL HIGH (ref 70–99)

## 2024-02-26 LAB — COMPREHENSIVE METABOLIC PANEL WITH GFR
ALT: 41 U/L (ref 0–44)
AST: 59 U/L — ABNORMAL HIGH (ref 15–41)
Albumin: 3.3 g/dL — ABNORMAL LOW (ref 3.5–5.0)
Alkaline Phosphatase: 250 U/L — ABNORMAL HIGH (ref 38–126)
Anion gap: 13 (ref 5–15)
BUN: 17 mg/dL (ref 8–23)
CO2: 20 mmol/L — ABNORMAL LOW (ref 22–32)
Calcium: 9.3 mg/dL (ref 8.9–10.3)
Chloride: 99 mmol/L (ref 98–111)
Creatinine, Ser: 1.21 mg/dL — ABNORMAL HIGH (ref 0.44–1.00)
GFR, Estimated: 46 mL/min — ABNORMAL LOW (ref >60)
Glucose, Bld: 431 mg/dL — ABNORMAL HIGH (ref 70–99)
Potassium: 4.6 mmol/L (ref 3.5–5.1)
Sodium: 132 mmol/L — ABNORMAL LOW (ref 135–145)
Total Bilirubin: 2 mg/dL — ABNORMAL HIGH (ref 0.0–1.2)
Total Protein: 6.7 g/dL (ref 6.5–8.1)

## 2024-02-26 LAB — TROPONIN I (HIGH SENSITIVITY): Troponin I (High Sensitivity): 15 ng/L (ref <18)

## 2024-02-26 LAB — POCT I-STAT CREATININE: Creatinine, Ser: 1.2 mg/dL — ABNORMAL HIGH (ref 0.44–1.00)

## 2024-02-26 LAB — BRAIN NATRIURETIC PEPTIDE: B Natriuretic Peptide: 93.3 pg/mL (ref 0.0–100.0)

## 2024-02-26 MED ORDER — CHLORHEXIDINE GLUCONATE CLOTH 2 % EX PADS
6.0000 | MEDICATED_PAD | Freq: Every day | CUTANEOUS | Status: DC
  Administered 2024-02-26: 6 via TOPICAL

## 2024-02-26 MED ORDER — PROMETHAZINE HCL 25 MG PO TABS
12.5000 mg | ORAL_TABLET | Freq: Once | ORAL | Status: AC
  Administered 2024-02-26: 12.5 mg via ORAL
  Filled 2024-02-26: qty 1

## 2024-02-26 MED ORDER — LORATADINE 10 MG PO TABS
10.0000 mg | ORAL_TABLET | Freq: Every day | ORAL | Status: DC
  Administered 2024-02-26 – 2024-02-28 (×3): 10 mg via ORAL
  Filled 2024-02-26 (×3): qty 1

## 2024-02-26 MED ORDER — ALBUTEROL SULFATE (2.5 MG/3ML) 0.083% IN NEBU
3.0000 mL | INHALATION_SOLUTION | Freq: Four times a day (QID) | RESPIRATORY_TRACT | Status: DC | PRN
  Administered 2024-02-27 – 2024-02-28 (×2): 3 mL via RESPIRATORY_TRACT
  Filled 2024-02-26 (×2): qty 3

## 2024-02-26 MED ORDER — DULOXETINE HCL 30 MG PO CPEP
60.0000 mg | ORAL_CAPSULE | Freq: Every day | ORAL | Status: DC
  Administered 2024-02-26 – 2024-02-28 (×3): 60 mg via ORAL
  Filled 2024-02-26 (×3): qty 2

## 2024-02-26 MED ORDER — HYDROCODONE-ACETAMINOPHEN 5-325 MG PO TABS
1.0000 | ORAL_TABLET | Freq: Four times a day (QID) | ORAL | Status: DC | PRN
  Administered 2024-02-26 – 2024-02-28 (×3): 1 via ORAL
  Filled 2024-02-26 (×3): qty 1

## 2024-02-26 MED ORDER — PANTOPRAZOLE SODIUM 40 MG PO TBEC
40.0000 mg | DELAYED_RELEASE_TABLET | Freq: Every day | ORAL | Status: DC
  Administered 2024-02-26 – 2024-02-28 (×3): 40 mg via ORAL
  Filled 2024-02-26 (×3): qty 1

## 2024-02-26 MED ORDER — INSULIN ASPART 100 UNIT/ML IJ SOLN
0.0000 [IU] | Freq: Every day | INTRAMUSCULAR | Status: DC
  Administered 2024-02-26 – 2024-02-27 (×2): 4 [IU] via SUBCUTANEOUS
  Filled 2024-02-26: qty 0.05

## 2024-02-26 MED ORDER — HEPARIN (PORCINE) 25000 UT/250ML-% IV SOLN
1000.0000 [IU]/h | INTRAVENOUS | Status: DC
  Administered 2024-02-26: 1200 [IU]/h via INTRAVENOUS
  Filled 2024-02-26: qty 250

## 2024-02-26 MED ORDER — IOHEXOL 300 MG/ML  SOLN
100.0000 mL | Freq: Once | INTRAMUSCULAR | Status: AC | PRN
  Administered 2024-02-26: 100 mL via INTRAVENOUS

## 2024-02-26 MED ORDER — HEPARIN BOLUS VIA INFUSION
5000.0000 [IU] | Freq: Once | INTRAVENOUS | Status: AC
  Administered 2024-02-26: 5000 [IU] via INTRAVENOUS
  Filled 2024-02-26: qty 5000

## 2024-02-26 MED ORDER — NITROFURANTOIN MONOHYD MACRO 100 MG PO CAPS
100.0000 mg | ORAL_CAPSULE | Freq: Two times a day (BID) | ORAL | Status: DC
  Administered 2024-02-26 – 2024-02-28 (×4): 100 mg via ORAL
  Filled 2024-02-26 (×4): qty 1

## 2024-02-26 MED ORDER — INSULIN ASPART 100 UNIT/ML IJ SOLN
0.0000 [IU] | Freq: Three times a day (TID) | INTRAMUSCULAR | Status: DC
  Administered 2024-02-27 (×2): 7 [IU] via SUBCUTANEOUS
  Administered 2024-02-27: 9 [IU] via SUBCUTANEOUS
  Administered 2024-02-28: 5 [IU] via SUBCUTANEOUS
  Filled 2024-02-26: qty 0.09

## 2024-02-26 MED ORDER — FUROSEMIDE 40 MG PO TABS
40.0000 mg | ORAL_TABLET | Freq: Every day | ORAL | Status: DC
  Administered 2024-02-26 – 2024-02-28 (×3): 40 mg via ORAL
  Filled 2024-02-26 (×3): qty 1

## 2024-02-26 MED ORDER — INSULIN GLARGINE-YFGN 100 UNIT/ML ~~LOC~~ SOLN
30.0000 [IU] | Freq: Every day | SUBCUTANEOUS | Status: DC
  Administered 2024-02-26 – 2024-02-27 (×2): 30 [IU] via SUBCUTANEOUS
  Filled 2024-02-26 (×2): qty 0.3

## 2024-02-26 MED ORDER — POLYETHYLENE GLYCOL 3350 17 G PO PACK: 17.0000 g | PACK | Freq: Every day | ORAL | Status: DC | PRN

## 2024-02-26 MED ORDER — HYDROXYZINE HCL 25 MG PO TABS
25.0000 mg | ORAL_TABLET | Freq: Two times a day (BID) | ORAL | Status: DC | PRN
  Administered 2024-02-26: 25 mg via ORAL
  Filled 2024-02-26: qty 1

## 2024-02-26 MED ORDER — ATENOLOL 25 MG PO TABS
25.0000 mg | ORAL_TABLET | Freq: Every day | ORAL | Status: DC
  Administered 2024-02-26 – 2024-02-28 (×2): 25 mg via ORAL
  Filled 2024-02-26 (×3): qty 1

## 2024-02-26 MED ORDER — CHLORHEXIDINE GLUCONATE CLOTH 2 % EX PADS: 6.0000 | MEDICATED_PAD | Freq: Every day | CUTANEOUS | Status: DC

## 2024-02-26 MED ORDER — PREDNISONE 10 MG PO TABS
10.0000 mg | ORAL_TABLET | Freq: Every day | ORAL | Status: DC
  Administered 2024-02-27 – 2024-02-28 (×2): 10 mg via ORAL
  Filled 2024-02-26 (×2): qty 1

## 2024-02-26 MED ORDER — SODIUM CHLORIDE 0.9 % IV BOLUS: 1000.0000 mL | Freq: Once | INTRAVENOUS | Status: DC

## 2024-02-26 MED ORDER — DM-GUAIFENESIN ER 30-600 MG PO TB12
1.0000 | ORAL_TABLET | Freq: Two times a day (BID) | ORAL | Status: DC
  Administered 2024-02-26 – 2024-02-28 (×4): 1 via ORAL
  Filled 2024-02-26 (×4): qty 1

## 2024-02-26 MED ORDER — COENZYME Q10 100 MG PO TABS: 100.0000 mg | ORAL_TABLET | Freq: Every day | ORAL | Status: DC

## 2024-02-26 NOTE — Progress Notes (Signed)
 PHARMACY - ANTICOAGULATION CONSULT NOTE  Pharmacy Consult for IV heparin  Indication: pulmonary embolus  Allergies  Allergen Reactions   Codeine  Other (See Comments)    Unknown    Doxycycline  Hives   Epinephrine Hcl Other (See Comments)    Had tremors when it was given.   Erythromycin Ethylsuccinate Other (See Comments)    Abdominal spasms   Eucalyptus Oil Other (See Comments)    Unknown    Iohexol  Hives    Pt needs premedicated-- hives on prev contrast study per md office    Menthol Other (See Comments)    Unknown    Metformin  Diarrhea   Oxycodone  Other (See Comments)    felt like I was dying   Tape Rash    Patient Measurements: Height: 5' 3 (160 cm) Weight: 84.8 kg (187 lb) IBW/kg (Calculated) : 52.4 HEPARIN  DW (KG): 71.3  Vital Signs: Temp: 98 F (36.7 C) (08/07 1948) Temp Source: Oral (08/07 1948) BP: 130/78 (08/07 2018) Pulse Rate: 91 (08/07 2018)  Labs: Recent Labs    02/26/24 1916 02/26/24 2002  HGB  --  13.9  HCT  --  43.4  PLT  --  126*  CREATININE 1.20*  --     Estimated Creatinine Clearance: 39.9 mL/min (A) (by C-G formula based on SCr of 1.2 mg/dL (H)).   Medical History: Past Medical History:  Diagnosis Date   Allergic rhinitis, cause unspecified    Anxiety    Arthritis    hands, feet and back   Asthma    Benign neoplasm of colon    Bursitis    hips   CHF (congestive heart failure) (HCC)    Cirrhosis (HCC)    auto immune   Complication of anesthesia     i WAKE UP WITH TREMORS  after shoulder surgery tremors for 10 months after surgery surgery- 2009- surgical center in [place]   Cyst and pseudocyst of pancreas    Depressive disorder, not elsewhere classified    Diabetes mellitus without complication (HCC)    Diverticulitis    Diverticulosis of colon (without mention of hemorrhage)    Dyspnea    Dysrhythmia    arrhythmia   Esophageal reflux    Essential and other specified forms of tremor    History of GI bleed     History of kidney stones    Irritable bowel syndrome    Obesity, unspecified    Other specified cardiac dysrhythmias(427.89)    Periapical abscess without sinus    Polyneuropathy in diabetes(357.2)    PONV (postoperative nausea and vomiting)    Pure hypercholesterolemia    Renal cell carcinoma (HCC)    Sleep apnea    CPAP- non compliant   Type II or unspecified type diabetes mellitus with neurological manifestations, not stated as uncontrolled(250.60)     Medications:  (Not in a hospital admission)   Assessment: Pharmacy is consulted to start heparin  drip on 78 yo female diagnosed with PE. PMH is significant for RCC status post nephrectomy, asthma, CHF, autoimmune cirrhosis, DM, chronic back pain, paroxysmal atrial fibrillation who presents to the ED with complaint of abnormal CT scan. CT imaging of her chest abdomen pelvis and incidentally was found to have a large pulmonary embolus. No notes anticoagulation PTA.    Today, 02/26/24 Hgb 13.0, plt 126  SCr 1.21 mg/dl, CrCl 40 ml/min   Goal of Therapy:  Heparin  level 0.3-0.7 units/ml Monitor platelets by anticoagulation protocol: Yes   Plan:  Heparin  5000 unit bolus followed by  heparin  1200 units Obtain HL 8 hours after start of infusion Daily CBC, HL while on heparin  Monitor for signs and symptoms of bleeding     Dolphus Roller, PharmD, BCPS 02/26/2024 8:43 PM

## 2024-02-26 NOTE — ED Provider Notes (Signed)
 [facility]-ICU/STEPDOWN Provider Note  CSN: [id number] Arrival date & time: 02/26/24 1940  Chief Complaint(s) Abnormal Scan  HPI [name] [name] is a 78 y.o. female with past medical history as below, significant for RCC status post nephrectomy, asthma, CHF, autoimmune cirrhosis, DM, chronic back pain, paroxysmal atrial fibrillation who presents to the ED with complaint of abnormal CT scan  Patient is here for outpatient CT imaging of her chest abdomen pelvis and incidentally was found to have a large pulmonary embolus.  She reports exertional dyspnea gradually worsening over the past couple weeks and nocturnal dyspnea.  She does not wear home oxygen.  Denies any chest pain, no fevers or chills, no recent travel or sick contacts, no history of VTE.  Does report some transient swelling in her legs which seems to have improved  Past Medical History Past Medical History:  Diagnosis Date   Allergic rhinitis, cause unspecified    Anxiety    Arthritis    hands, feet and back   Asthma    Benign neoplasm of colon    Bursitis    hips   CHF (congestive heart failure) (HCC)    Cirrhosis (HCC)    auto immune   Complication of anesthesia     i WAKE UP WITH TREMORS  after shoulder surgery tremors for 10 months after surgery surgery- 2009- surgical center in [place]   Cyst and pseudocyst of pancreas    Depressive disorder, not elsewhere classified    Diabetes mellitus without complication (HCC)    Diverticulitis    Diverticulosis of colon (without mention of hemorrhage)    Dyspnea    Dysrhythmia    arrhythmia   Esophageal reflux    Essential and other specified forms of tremor    History of GI bleed    History of kidney stones    Irritable bowel syndrome    Obesity, unspecified    Other specified cardiac dysrhythmias(427.89)    Periapical abscess without sinus    Polyneuropathy in diabetes(357.2)    PONV (postoperative nausea and vomiting)    Pure  hypercholesterolemia    Renal cell carcinoma (HCC)    Sleep apnea    CPAP- non compliant   Type II or unspecified type diabetes mellitus with neurological manifestations, not stated as uncontrolled(250.60)    Patient Active Problem List   Diagnosis Date Noted   Acute pulmonary embolus (HCC) 02/26/2024   Weakness generalized 11/18/2023   PAF (paroxysmal atrial fibrillation) (HCC) 11/18/2023   Irritability and anger 11/18/2023   Dizziness 10/18/2023   Near syncope 10/18/2023   MDD (major depressive disorder), recurrent episode, moderate (HCC) 07/11/2023   Confusion 07/08/2023   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   Acute on chronic low back pain 06/08/2023   Intractable low back pain 06/06/2023   CKD stage 3a, GFR 45-59 ml/min (HCC) 06/06/2023   Lumbar radiculopathy 06/06/2023   Cirrhosis (HCC)    Renal cell cancer (HCC) 09/12/2022   Aortic atherosclerosis (HCC) 06/04/2022   Renal mass, left 05/27/2022   Swelling of limb 04/12/2022   Osteopenia 09/21/2021   History of nephrolithiasis 09/21/2021   Sensation of pressure in bladder area 09/21/2021   Anemia 08/17/2019   Hypertension 08/17/2019   Anxiety 08/17/2019   Lower GI bleeding 08/17/2019   Acute lower GI bleeding 08/16/2019   Autoimmune hepatitis (HCC) 12/29/2018   Chest pain 12/22/2018   Asthmatic bronchitis with exacerbation 03/21/2017   Elevated LFTs 12/24/2016   SVT (supraventricular tachycardia) (HCC) 12/24/2016   Peripheral  edema 12/24/2016   S/P foot surgery, right 07/27/2015   Preoperative clearance 06/29/2015   Angioedema 08/30/2014   Recurrent UTI 01/06/2014   Chronic insomnia 01/06/2014   Acute bilateral low back pain without sciatica 03/07/2011   Vitamin D  deficiency 08/20/2010   TINNITUS, CHRONIC, 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   Lung nodule 08/03/2007   CYST AND PSEUDOCYST OF PANCREAS 08/03/2007   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   TUBULOVILLOUS ADENOMA, COLON 12/30/2006   HYPERCHOLESTEROLEMIA  12/30/2006   OBESITY 12/30/2006   TREMOR, ESSENTIAL 12/30/2006   Palpitations 12/30/2006   Seasonal and perennial allergic rhinitis 12/30/2006   GERD 12/30/2006   DIVERTICULOSIS, COLON 12/30/2006   IBS 12/30/2006   Obstructive sleep apnea 12/30/2006   URINARY INCONTINENCE 12/30/2006   Poorly controlled type 2 diabetes mellitus (HCC) 12/22/2006   Home Medication(s) Prior to Admission medications   Medication Sig Start Date End Date Taking? Authorizing Provider  albuterol  (PROAIR  HFA) 108 (90 Base) MCG/ACT inhaler Inhale 2 puffs into the lungs every 6 (six) hours as needed for wheezing or shortness of breath. 06/13/23  Yes Bedsole, Amy E, MD  atenolol  (TENORMIN ) 25 MG tablet TAKE 1 TABLET (25 MG TOTAL) BY MOUTH DAILY. 01/19/24  Yes Court Dorn PARAS, MD  cetirizine (ZYRTEC) 10 MG tablet Take 10 mg by mouth daily.   Yes [provider]  Coenzyme Q10 100 MG TABS Take 100 mg by mouth daily.   Yes [provider]  CRANBERRY PO Take 1,000 mg by mouth in the morning and at bedtime.   Yes [provider]  D-MANNOSE PO Take 4,000 mg by mouth in the morning and at bedtime.   Yes [provider]  DULoxetine  (CYMBALTA ) 60 MG capsule Take 1 capsule (60 mg total) by mouth daily. 07/11/23  Yes Bedsole, Amy E, MD  furosemide  (LASIX ) 40 MG tablet Take 40 mg by mouth daily.   Yes [provider]  HYDROcodone -acetaminophen  (NORCO/VICODIN) 5-325 MG tablet Take 1 tablet by mouth in the morning and at bedtime. 02/16/24  Yes [provider]  hydrOXYzine  (ATARAX ) 25 MG tablet Take 25 mg by mouth 2 (two) times daily as needed for anxiety.   Yes [provider]  insulin  lispro (HUMALOG) 100 UNIT/ML injection Inject into the skin 3 (three) times daily before meals.   Yes [provider]  nitrofurantoin , macrocrystal-monohydrate, (MACROBID ) 100 MG capsule Take 100 mg by mouth 2 (two) times daily.   Yes [provider]  pantoprazole   (PROTONIX ) 40 MG tablet TAKE 1 TABLET BY MOUTH EVERY DAY 09/17/19  Yes Wendling, Mabel Mt, DO  polyethylene glycol (MIRALAX  / GLYCOLAX ) 17 g packet Take 17 g by mouth daily as needed for mild constipation. 06/09/23  Yes Hongalgi, Anand D, MD  predniSONE  (DELTASONE ) 10 MG tablet Take 10 mg by mouth daily with breakfast.   Yes [provider]  promethazine  (PHENERGAN ) 12.5 MG tablet Take 12.5 mg by mouth 2 (two) times daily as needed for nausea. 02/16/24  Yes [provider]  traMADol  (ULTRAM ) 50 MG tablet Take 1 tablet (50 mg total) by mouth every 8 (eight) hours as needed for severe pain (pain score 7-10). 10/21/23  Yes Amin, Ankit C, MD  UNABLE TO FIND Take 1 tablet by mouth in the morning and at bedtime. Med Name: Korene NOVAK Vitamin   Yes [provider]  DULoxetine  (CYMBALTA ) 30 MG capsule Take 1 capsule (30 mg total) by mouth daily. Take along with 60 mg for 90 mg total daily. Patient not taking: Reported  on 02/26/2024 11/18/23   Avelina Greig [name], MD  oxybutynin (DITROPAN-XL) 10 MG 24 hr tablet Take 10 mg by mouth daily. Patient not taking: Reported on 02/26/2024    [provider]                                                                                                                                    Past Surgical History Past Surgical History:  Procedure Laterality Date   ABDOMINAL HYSTERECTOMY     CALCANEAL OSTEOTOMY Right 07/27/2015   Procedure: RIGHT CALCANEAL OSTEOTOMY ;  Surgeon: Norleen Armor, MD;  Location: [facility];  Service: Orthopedics;  Laterality: Right;   CHOLECYSTECTOMY     15-20 years ago   COLONOSCOPY WITH PROPOFOL  N/A 08/17/2019   Procedure: COLONOSCOPY WITH PROPOFOL ;  Surgeon: Rollin Dover, MD;  Location: [facility];  Service: Endoscopy;  Laterality: N/A;   COLONOSCOPY WITH PROPOFOL  N/A 06/21/2022   Procedure: COLONOSCOPY WITH PROPOFOL ;  Surgeon: Rollin Dover, MD;  Location: [facility];  Service: Gastroenterology;   Laterality: N/A;   ESOPHAGOGASTRODUODENOSCOPY (EGD) WITH PROPOFOL  N/A 08/17/2019   Procedure: ESOPHAGOGASTRODUODENOSCOPY (EGD) WITH PROPOFOL ;  Surgeon: Rollin Dover, MD;  Location: [facility];  Service: Endoscopy;  Laterality: N/A;   GASTROCNEMIUS RECESSION Right 07/27/2015   Procedure: RIGHT GASTROC RECESSION;  Surgeon: Norleen Armor, MD;  Location: [facility];  Service: Orthopedics;  Laterality: Right;   KIDNEY STONE SURGERY  1980's   removal   LITHOTRIPSY     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   PARTIAL HYSTERECTOMY  1990's   POLYPECTOMY  08/17/2019   Procedure: POLYPECTOMY;  Surgeon: Rollin Dover, MD;  Location: [facility];  Service: Endoscopy;;   POLYPECTOMY  06/21/2022   Procedure: POLYPECTOMY;  Surgeon: Rollin Dover, MD;  Location: [facility];  Service: Gastroenterology;;   ROBOT ASSISTED LAPAROSCOPIC NEPHRECTOMY Left 08/14/2022   Procedure: XI ROBOTIC ASSISTED LAPAROSCOPIC NEPHRECTOMY;  Surgeon: Devere Lonni Righter, MD;  Location: [facility];  Service: Urology;  Laterality: Left;   ROTATOR CUFF REPAIR  2005   XI ROBOTIC LAPAROSCOPIC ASSISTED APPENDECTOMY N/A 08/14/2022   Procedure: XI ROBOTIC LAPAROSCOPIC ASSISTED APPENDECTOMY;  Surgeon: Debby Hila, MD;  Location: [facility];  Service: General;  Laterality: N/A;   Family History Family History  Problem Relation Age of Onset   Clotting disorder Mother    Cirrhosis Father    Diabetes Other        2 aunts   Cancer Other        grandmother   Allergies Other        whole family   Asthma Other        whole family    Social History Social History   Tobacco Use   Smoking status: Never   Smokeless tobacco: Never  Vaping Use   Vaping status: Never Used  Substance Use Topics   Alcohol use: No    Alcohol/week: 0.0 standard drinks of alcohol  Drug use: No   Allergies Codeine , Doxycycline , Epinephrine hcl, Erythromycin ethylsuccinate, Eucalyptus oil, Iohexol , Menthol, Metformin , Oxycodone , and Tape  Review of  Systems A thorough review of systems was obtained and all systems are negative except as noted in the HPI and PMH.   Physical Exam Vital Signs  I have reviewed the triage vital signs BP 130/78   Pulse 91   Temp 98 F (36.7 C) (Oral)   Resp 13   Ht 5' 3 (1.6 m)   Wt 84.8 kg   SpO2 97%   BMI 33.13 kg/m  Physical Exam Vitals and nursing note reviewed.  Constitutional:      General: She is not in acute distress.    Appearance: Normal appearance.  HENT:     Head: Normocephalic and atraumatic.     Right Ear: External ear normal.     Left Ear: External ear normal.     Nose: Nose normal.     Mouth/Throat:     Mouth: Mucous membranes are moist.  Eyes:     General: No scleral icterus.       Right eye: No discharge.        Left eye: No discharge.  Cardiovascular:     Rate and Rhythm: Normal rate and regular rhythm.     Pulses: Normal pulses.     Heart sounds: Normal heart sounds.  Pulmonary:     Effort: Pulmonary effort is normal. No respiratory distress.     Breath sounds: Normal breath sounds. No stridor.  Abdominal:     General: Abdomen is flat. There is no distension.     Palpations: Abdomen is soft.     Tenderness: There is no abdominal tenderness.  Musculoskeletal:     Cervical back: No rigidity.     Right lower leg: Edema present.     Left lower leg: Edema present.     Comments: Trace edema symmetric bilateral  Skin:    General: Skin is warm and dry.     Capillary Refill: Capillary refill takes less than 2 seconds.  Neurological:     Mental Status: She is alert.  Psychiatric:        Mood and Affect: Mood normal.        Behavior: Behavior normal. Behavior is cooperative.     ED Results and Treatments Labs (all labs ordered are listed, but only abnormal results are displayed) Labs Reviewed  CBC - Abnormal; Notable for the following components:      Result Value   WBC 3.0 (*)    Platelets 126 (*)    All other components within normal limits  COMPREHENSIVE  METABOLIC PANEL WITH GFR - Abnormal; Notable for the following components:   Sodium 132 (*)    CO2 20 (*)    Glucose, Bld 431 (*)    Creatinine, Ser 1.21 (*)    Albumin 3.3 (*)    AST 59 (*)    Alkaline Phosphatase 250 (*)    Total Bilirubin 2.0 (*)    GFR, Estimated 46 (*)    All other components within normal limits  MRSA NEXT GEN BY PCR, NASAL  BRAIN NATRIURETIC PEPTIDE  URINALYSIS, ROUTINE W REFLEX MICROSCOPIC  CBC  HEPARIN  LEVEL (UNFRACTIONATED)  TROPONIN I (HIGH SENSITIVITY)  TROPONIN I (HIGH SENSITIVITY)  Radiology CT CHEST ABDOMEN PELVIS W CONTRAST Result Date: 02/26/2024 CLINICAL DATA:  Kidney cancer, post nephrectomy, monitor surveillance visit for history of RCC s/p nephrectomy. lung nodules need to monitor. EXAM: CT CHEST, ABDOMEN, AND PELVIS WITH CONTRAST TECHNIQUE: Multidetector CT imaging of the chest, abdomen and pelvis was performed following the standard protocol during bolus administration of intravenous contrast. RADIATION DOSE REDUCTION: This exam was performed according to the departmental dose-optimization program which includes automated exposure control, adjustment of the mA and/or kV according to patient size and/or use of iterative reconstruction technique. CONTRAST:  OMNIPAQUE  IOHEXOL  300 MG/ML  SOLN COMPARISON:  CT chest abdomen pelvis 10/18/2023 FINDINGS: CT CHEST FINDINGS Cardiovascular: There is a main pulmonary artery filling defect extending to the right main pulmonary artery as well as right upper and right middle segmental pulmonary arteries. Normal heart size. No significant pericardial effusion. The thoracic aorta is normal in caliber. No atherosclerotic plaque of the thoracic aorta. No coronary artery calcifications. Mitral annular calcification. Mediastinum/Nodes: No enlarged mediastinal, hilar, or axillary lymph nodes. Thyroid   gland, trachea, and esophagus demonstrate no significant findings. Lungs/Pleura: Stable right lower lobe paravertebral mucous plugging (7:1 20-127). Similar finding within the peripheral left lower lobe (7:104) with associated stable cluster of calcified pulmonary micronodules. Stable pulmonary punctate micronodule within the left lower lobe (7:94). Stable 5 mm left lower lobe subpleural nodule (7:68). Subpleural triangular lymph node along the left major fissure likely a intrapulmonary lymph node-no further follow-up indicated (7:90). No pulmonary mass. No pleural effusion. No pneumothorax. Musculoskeletal: No chest wall abnormality. No suspicious lytic or blastic osseous lesions. No acute displaced fracture. Multilevel degenerative changes of the spine. CT ABDOMEN PELVIS FINDINGS Hepatobiliary: Persistent mild nodular hepatic contour. No focal liver abnormality. Status post cholecystectomy. No biliary dilatation. Pancreas: Stable hypodense pancreatic lesions. Redemonstration of a 1.4 cm hypodense fluid density lesion along the proximal pancreas, a 1.4 cm fluid dense lesion along the pancreatic tail, a 0.6 cm distal pancreatic body hypodensity (2:61). Normal pancreatic contour. No surrounding inflammatory changes. No main pancreatic ductal dilatation. Spleen: Normal in size without focal abnormality. Adrenals/Urinary Tract: No adrenal nodule bilaterally. Status post left nephrectomy. The right kidney enhances homogeneously. No right renal mass. No hydronephrosis. No hydroureter.  No nephroureterolithiasis. The urinary bladder is unremarkable. On delayed imaging, there is no urothelial wall thickening and there are no filling defects in the opacified portions of the right collecting system. Stomach/Bowel: Stomach is within normal limits. No evidence of bowel wall thickening or dilatation. Colonic diverticulosis. Status post appendectomy. Vascular/Lymphatic: No abdominal aorta or iliac aneurysm. Severe  atherosclerotic plaque of the aorta and its branches. No abdominal, pelvic, or inguinal lymphadenopathy. Reproductive: Status post hysterectomy. No adnexal masses. Other: No intraperitoneal free fluid. No intraperitoneal free gas. No organized fluid collection. Musculoskeletal: No abdominal wall hernia or abnormality. No suspicious lytic or blastic osseous lesions. No acute displaced fracture. Multilevel severe degenerative changes of the spine. IMPRESSION: 1. Main, right main, and segmental right upper and middle lobe pulmonary emboli. Associated right heart strain. No pulmonary infarction. 2. No findings to suggest recurrent or metastatic malignancy in a patient with prior RCC status post left nephrectomy. Other imaging findings of potential clinical significance: 1. Stable bilateral lower lobe mucous plugging. Associated cluster of stable calcified pulmonary micronodules within the left lower lobe. 2. Couple stable pulmonary micronodules within the left lower lobe measuring up to 5 mm. 3. Stable hypodense pancreatic lesions. 4. Cirrhosis. No focal liver lesions identified. Please note that liver protocol enhanced MR  and CT are the most sensitive tests for the screening detection of hepatocellular carcinoma in the high risk setting of cirrhosis. 5. Colonic diverticulosis with no acute diverticulitis. 6.  Aortic Atherosclerosis (ICD10-I70.0). These results were called by telephone at the time of interpretation on 02/26/2024 at 7:59 pm to provider Christina [place] traige nurse who verbally acknowledged these results. Electronically Signed   By: Morgane  Naveau M.D.   On: 02/26/2024 20:13    Pertinent labs & imaging results that were available during my care of the patient were reviewed by me and considered in my medical decision making (see MDM for details).  Medications Ordered in ED Medications  heparin  ADULT infusion 100 units/mL (25000 units/250mL) (1,200 Units/hr Intravenous New Bag/Given 02/26/24 2100)   sodium chloride  0.9 % bolus 1,000 mL (has no administration in time range)  albuterol  (PROVENTIL ) (2.5 MG/3ML) 0.083% nebulizer solution 3 mL (has no administration in time range)  atenolol  (TENORMIN ) tablet 25 mg (has no administration in time range)  loratadine  (CLARITIN ) tablet 10 mg (has no administration in time range)  DULoxetine  (CYMBALTA ) DR capsule 60 mg (has no administration in time range)  furosemide  (LASIX ) tablet 40 mg (has no administration in time range)  HYDROcodone -acetaminophen  (NORCO/VICODIN) 5-325 MG per tablet 1 tablet (has no administration in time range)  hydrOXYzine  (ATARAX ) tablet 25 mg (has no administration in time range)  nitrofurantoin  (macrocrystal-monohydrate) (MACROBID ) capsule 100 mg (has no administration in time range)  pantoprazole  (PROTONIX ) EC tablet 40 mg (has no administration in time range)  polyethylene glycol (MIRALAX  / GLYCOLAX ) packet 17 g (has no administration in time range)  predniSONE  (DELTASONE ) tablet 10 mg (has no administration in time range)  insulin  aspart (novoLOG ) injection 0-9 Units (has no administration in time range)  insulin  aspart (novoLOG ) injection 0-5 Units (has no administration in time range)  insulin  glargine-yfgn (SEMGLEE ) injection 30 Units (has no administration in time range)  dextromethorphan -guaiFENesin  (MUCINEX  DM) 30-600 MG per 12 hr tablet 1 tablet (has no administration in time range)  Chlorhexidine  Gluconate Cloth 2 % PADS 6 each (has no administration in time range)  promethazine  (PHENERGAN ) tablet 12.5 mg (has no administration in time range)  heparin  bolus via infusion 5,000 Units (5,000 Units Intravenous Bolus from Bag 02/26/24 2056)                                                                                                                                     Procedures .Critical Care  Performed by: Elnor Jayson LABOR, DO Authorized by: Elnor Jayson LABOR, DO   Critical care provider statement:    Critical  care time (minutes):  30   Critical care time was exclusive of:  Separately billable procedures and treating other patients   Critical care was necessary to treat or prevent imminent or life-threatening deterioration of the following conditions:  Circulatory failure   Critical care was time spent personally by me on the  following activities:  Development of treatment plan with patient or surrogate, discussions with consultants, evaluation of patient's response to treatment, examination of patient, ordering and review of laboratory studies, ordering and review of radiographic studies, ordering and performing treatments and interventions, pulse oximetry, re-evaluation of patient's condition, review of old charts and obtaining history from patient or surrogate   Care discussed with: admitting provider     (including critical care time)  Medical Decision Making / ED Course    Medical Decision Making:    [name] [name] is a 78 y.o. female  with past medical history as below, significant for RCC status post nephrectomy, asthma, CHF, autoimmune cirrhosis, DM, chronic back pain, paroxysmal atrial fibrillation who presents to the ED with complaint of abnormal CT scan The complaint involves an extensive differential diagnosis and also carries with it a high risk of complications and morbidity.  Serious etiology was considered. Ddx includes but is not limited to: In my evaluation of this patient's dyspnea my DDx includes, but is not limited to, pneumonia, pulmonary embolism, pneumothorax, pulmonary edema, metabolic acidosis, asthma, COPD, cardiac cause, anemia, anxiety, etc.    Complete initial physical exam performed, notably the patient was in no acute stress, no hypoxia.    Reviewed and confirmed nursing documentation for past medical history, family history, social history.  Vital signs reviewed.     Main right, segmental right upper middle lobe pulmonary embolism with right heart strain > -Denies  history of VTE, does have history of renal cell carcinoma and paroxysmal atrial fibrillation not on anticoagulation -She denies any concern for bleeding at this time. -She has no hypoxia at rest, she is HDS.  Does have orthopnea exertional dyspnea that is new/worse last few weeks -spoke w/ pulm Dr Maree, pt can go to stepdown, no need for mechanical intervention or systemic lytics -Labs are stable -she reports some intermittent leg swelling, appears symmetric on exam today, would likely benefit from LE duplex given pe today -admit TRH                        Additional history obtained: -Additional history obtained from family -External records from outside source obtained and reviewed including: Chart review including previous notes, labs, imaging, consultation notes including  Prior imaging, home medications, prior labs, primary care epigastric   Lab Tests: -I ordered, reviewed, and interpreted labs.   The pertinent results include:   Labs Reviewed  CBC - Abnormal; Notable for the following components:      Result Value   WBC 3.0 (*)    Platelets 126 (*)    All other components within normal limits  COMPREHENSIVE METABOLIC PANEL WITH GFR - Abnormal; Notable for the following components:   Sodium 132 (*)    CO2 20 (*)    Glucose, Bld 431 (*)    Creatinine, Ser 1.21 (*)    Albumin 3.3 (*)    AST 59 (*)    Alkaline Phosphatase 250 (*)    Total Bilirubin 2.0 (*)    GFR, Estimated 46 (*)    All other components within normal limits  MRSA NEXT GEN BY PCR, NASAL  BRAIN NATRIURETIC PEPTIDE  URINALYSIS, ROUTINE W REFLEX MICROSCOPIC  CBC  HEPARIN  LEVEL (UNFRACTIONATED)  TROPONIN I (HIGH SENSITIVITY)  TROPONIN I (HIGH SENSITIVITY)    Notable for labs stable  EKG   EKG Interpretation Date/Time:    Ventricular Rate:    PR Interval:    QRS Duration:  QT Interval:    QTC Calculation:   R Axis:      Text Interpretation:           Imaging Studies  ordered: I reviewed CT chest abdomen pelvis that was obtained outpatient earlier this evening, I independently interpreted this image and I agree with radiologist interpretation as well   Medicines ordered and prescription drug management: Meds ordered this encounter  Medications   heparin  bolus via infusion 5,000 Units   heparin  ADULT infusion 100 units/mL (25000 units/250mL)   sodium chloride  0.9 % bolus 1,000 mL   albuterol  (PROVENTIL ) (2.5 MG/3ML) 0.083% nebulizer solution 3 mL   atenolol  (TENORMIN ) tablet 25 mg   loratadine  (CLARITIN ) tablet 10 mg   DISCONTD: Coenzyme Q10 TABS 100 mg   DULoxetine  (CYMBALTA ) DR capsule 60 mg   furosemide  (LASIX ) tablet 40 mg   HYDROcodone -acetaminophen  (NORCO/VICODIN) 5-325 MG per tablet 1 tablet    Refill:  0   hydrOXYzine  (ATARAX ) tablet 25 mg   nitrofurantoin  (macrocrystal-monohydrate) (MACROBID ) capsule 100 mg   pantoprazole  (PROTONIX ) EC tablet 40 mg   polyethylene glycol (MIRALAX  / GLYCOLAX ) packet 17 g   predniSONE  (DELTASONE ) tablet 10 mg   insulin  aspart (novoLOG ) injection 0-9 Units    Correction coverage::   Sensitive (thin, NPO, renal)    CBG < 70::   Implement Hypoglycemia Standing Orders and refer to Hypoglycemia Standing Orders sidebar report    CBG 70 - 120::   0 units    CBG 121 - 150::   1 unit    CBG 151 - 200::   2 units    CBG 201 - 250::   3 units    CBG 251 - 300::   5 units    CBG 301 - 350::   7 units    CBG 351 - 400:   9 units    CBG > 400:   call MD and obtain STAT lab verification   insulin  aspart (novoLOG ) injection 0-5 Units    Correction coverage::   HS scale    CBG < 70::   Implement Hypoglycemia Standing Orders and refer to Hypoglycemia Standing Orders sidebar report    CBG 70 - 120::   0 units    CBG 121 - 150::   0 units    CBG 151 - 200::   0 units    CBG 201 - 250::   2 units    CBG 251 - 300::   3 units    CBG 301 - 350::   4 units    CBG 351 - 400::   5 units    CBG > 400:   call MD and obtain  STAT lab verification   insulin  glargine-yfgn (SEMGLEE ) injection 30 Units   dextromethorphan -guaiFENesin  (MUCINEX  DM) 30-600 MG per 12 hr tablet 1 tablet   DISCONTD: Chlorhexidine  Gluconate Cloth 2 % PADS 6 each   Chlorhexidine  Gluconate Cloth 2 % PADS 6 each   promethazine  (PHENERGAN ) tablet 12.5 mg    -I have reviewed the patients home medicines and have made adjustments as needed   Consultations Obtained: I requested consultation with the PCCM Dr Maree,  and discussed lab and imaging findings as well as pertinent plan - they recommend: stepdown okay   Cardiac Monitoring: The patient was maintained on a cardiac monitor.  I personally viewed and interpreted the cardiac monitored which showed an underlying rhythm of: nsr Continuous pulse oximetry interpreted by myself, 98% on RA.    Social Determinants of  Health:  Diagnosis or treatment significantly limited by social determinants of health: obesity   Reevaluation: After the interventions noted above, I reevaluated the patient and found that they have improved  Co morbidities that complicate the patient evaluation  Past Medical History:  Diagnosis Date   Allergic rhinitis, cause unspecified    Anxiety    Arthritis    hands, feet and back   Asthma    Benign neoplasm of colon    Bursitis    hips   CHF (congestive heart failure) (HCC)    Cirrhosis (HCC)    auto immune   Complication of anesthesia     i WAKE UP WITH TREMORS  after shoulder surgery tremors for 10 months after surgery surgery- 2009- surgical center in    Cyst and pseudocyst of pancreas    Depressive disorder, not elsewhere classified    Diabetes mellitus without complication (HCC)    Diverticulitis    Diverticulosis of colon (without mention of hemorrhage)    Dyspnea    Dysrhythmia    arrhythmia   Esophageal reflux    Essential and other specified forms of tremor    History of GI bleed    History of kidney stones    Irritable bowel  syndrome    Obesity, unspecified    Other specified cardiac dysrhythmias(427.89)    Periapical abscess without sinus    Polyneuropathy in diabetes(357.2)    PONV (postoperative nausea and vomiting)    Pure hypercholesterolemia    Renal cell carcinoma (HCC)    Sleep apnea    CPAP- non compliant   Type II or unspecified type diabetes mellitus with neurological manifestations, not stated as uncontrolled(250.60)       Dispostion: Disposition decision including need for hospitalization was considered, and patient admitted to the hospital.    Final Clinical Impression(s) / ED Diagnoses Final diagnoses:  Other acute pulmonary embolism with acute cor pulmonale (HCC)        Elnor Jayson LABOR, DO 02/26/24 2220

## 2024-02-26 NOTE — ED Notes (Signed)
 Blue save tube sent to lab

## 2024-02-26 NOTE — H&P (Addendum)
 History and Physical    [name] [name] FMW:[id number] DOB: 1945-12-07 DOA: 02/26/2024  PCP: Avelina Greig [name], MD  Patient coming from: Home  I have personally briefly reviewed patient's old medical records available.   Chief Complaint: Was coughing for about 2 weeks now, doctor asked me to come to the emergency room with abnormal CT scan.  HPI: [name] [name] is a 78 y.o. female with medical history significant of history of renal cell cancer status post nephrectomy and now with solitary kidney, type 2 diabetes on insulin  pump, diastolic heart failure, autoimmune hepatitis and cirrhosis, anxiety, mild intermittent asthma, recently suffering from back pain, GERD, hyperlipidemia who underwent a surveillance CT scan of the chest abdomen pelvis yesterday as ordered by her oncologist Dr. Federico.  On the CT scan results, she was noted to have pulmonary embolism on the right main, segmental right upper and middle lobe pulmonary artery with associated right heart strain.  No pulmonary infarction.  She is also noted to have mucous plugging of the lower lobes.  Patient was asked to come to the emergency room. Patient herself does have chronic cough and last 2 weeks she has more cough that is also causing back pain, as well occasional mucoid sputum production.  No fever.  Patient denies any headache, nausea, vomiting, chest pain.  She denies any shortness of breath.  She does have some swelling of the legs all the time and also has pain on her legs, left leg is slightly more swollen than right which she thinks is chronic.  She has undergone left nephrectomy and has been doing well since surgery without evidence of recurrence of cancer.  Due to back pain, she has been laying in the bed most of the time with less activities for at least 2 weeks now.  She is otherwise carry out her ADLs, lives alone and independent. ED Course: Hemodynamically stable.  100% on room air.  Platelets 126.  Glucose 431, patient had disconnected  her insulin  pump last few days to get the CT scan.  Creatinine 1.2 which is at about baseline.  Started on heparin  and advised admission for monitoring.  Patient is fairly stable. ER physician did discuss case with pulmonary, they advised no need for mechanical thrombectomy or systemic lytics given stability.  Review of Systems: all systems are reviewed and pertinent positive as per HPI otherwise rest are negative.    Past Medical History:  Diagnosis Date   Allergic rhinitis, cause unspecified    Anxiety    Arthritis    hands, feet and back   Asthma    Benign neoplasm of colon    Bursitis    hips   CHF (congestive heart failure) (HCC)    Cirrhosis (HCC)    auto immune   Complication of anesthesia     i WAKE UP WITH TREMORS  after shoulder surgery tremors for 10 months after surgery surgery- 2009- surgical center in [place]   Cyst and pseudocyst of pancreas    Depressive disorder, not elsewhere classified    Diabetes mellitus without complication (HCC)    Diverticulitis    Diverticulosis of colon (without mention of hemorrhage)    Dyspnea    Dysrhythmia    arrhythmia   Esophageal reflux    Essential and other specified forms of tremor    History of GI bleed    History of kidney stones    Irritable bowel syndrome    Obesity, unspecified    Other specified cardiac dysrhythmias(427.89)  Periapical abscess without sinus    Polyneuropathy in diabetes(357.2)    PONV (postoperative nausea and vomiting)    Pure hypercholesterolemia    Renal cell carcinoma (HCC)    Sleep apnea    CPAP- non compliant   Type II or unspecified type diabetes mellitus with neurological manifestations, not stated as uncontrolled(250.60)     Past Surgical History:  Procedure Laterality Date   ABDOMINAL HYSTERECTOMY     CALCANEAL OSTEOTOMY Right 07/27/2015   Procedure: RIGHT CALCANEAL OSTEOTOMY ;  Surgeon: Norleen Armor, MD;  Location: [facility];  Service: Orthopedics;  Laterality:  Right;   CHOLECYSTECTOMY     15-20 years ago   COLONOSCOPY WITH PROPOFOL  N/A 08/17/2019   Procedure: COLONOSCOPY WITH PROPOFOL ;  Surgeon: Rollin Dover, MD;  Location[facility] END[facility]COPY;  Service: Endoscopy;  Laterality: N/A;   COLONOSCOPY WITH PROPOFOL  N/A 06/21/2022   Procedure: COLONOSCOPY WITH PROPOFOL ;  Surgeon: Rollin Dover, MD;  Location: [facility] ENDOSCOPY;  Service: Gastroenterology;  Laterality: N/A;   ESOPHAGOGASTRODUODENOSCOPY (EGD) WITH PROPOFOL  N/A 08/17/2019   Procedure: ESOPHAGOGASTRODUODENOSCOPY (EGD) WITH PROPOFOL ;  Surgeon: Rollin Dover, MD;  Location[facility] END[facility]COPY;  Service: Endoscopy;  Laterality: N/A;   GASTROCNEMIUS RECESSION Right 07/27/2015   Procedure: RIGHT GASTROC RECESSION;  Surgeon: Norleen Armor, MD;  Location: [facility];  Service: Orthopedics;  Laterality: Right;   KIDNEY STONE SURGERY  1980's   removal   LITHOTRIPSY     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   PARTIAL HYSTERECTOMY  1990's   POLYPECTOMY  08/17/2019   Procedure: POLYPECTOMY;  Surgeon: Rollin Dover, MD;  Location: [facility];  Service: Endoscopy;;   POLYPECTOMY  06/21/2022   Procedure: POLYPECTOMY;  Surgeon: Rollin Dover, MD;  Location: [facility];  Service: Gastroenterology;;   ROBOT ASSISTED LAPAROSCOPIC NEPHRECTOMY Left 08/14/2022   Procedure: XI ROBOTIC ASSISTED LAPAROSCOPIC NEPHRECTOMY;  Surgeon: Devere Lonni Righter, MD;  Location: [facility];  Service: Urology;  Laterality: Left;   ROTATOR CUFF REPAIR  2005   XI ROBOTIC LAPAROSCOPIC ASSISTED APPENDECTOMY N/A 08/14/2022   Procedure: XI ROBOTIC LAPAROSCOPIC ASSISTED APPENDECTOMY;  Surgeon: Debby Hila, MD;  Location: [facility];  Service: General;  Laterality: N/A;    Social history   reports that she has never smoked. She has never used smokeless tobacco. She reports that she does not drink alcohol and does not use drugs.  Allergies  Allergen Reactions   Codeine  Other (See Comments)    Unknown    Doxycycline  Hives   Epinephrine Hcl Other (See  Comments)    Had tremors when it was given.   Erythromycin Ethylsuccinate Other (See Comments)    Abdominal spasms   Eucalyptus Oil Other (See Comments)    Unknown    Iohexol  Hives    Pt needs premedicated-- hives on prev contrast study per md office    Menthol Other (See Comments)    Unknown    Metformin  Diarrhea   Oxycodone  Other (See Comments)    felt like I was dying   Tape Rash    Family History  Problem Relation Age of Onset   Clotting disorder Mother    Cirrhosis Father    Diabetes Other        2 aunts   Cancer Other        grandmother   Allergies Other        whole family   Asthma Other        whole family     Prior to Admission medications   Medication Sig Start  Date End Date Taking? Authorizing Provider  albuterol  (PROAIR  HFA) 108 (90 Base) MCG/ACT inhaler Inhale 2 puffs into the lungs every 6 (six) hours as needed for wheezing or shortness of breath. 06/13/23  Yes Bedsole, Amy E, MD  atenolol  (TENORMIN ) 25 MG tablet TAKE 1 TABLET (25 MG TOTAL) BY MOUTH DAILY. 01/19/24  Yes Court Dorn PARAS, MD  cetirizine (ZYRTEC) 10 MG tablet Take 10 mg by mouth daily.   Yes [provider]  Coenzyme Q10 100 MG TABS Take 100 mg by mouth daily.   Yes [provider]  CRANBERRY PO Take 1,000 mg by mouth in the morning and at bedtime.   Yes [provider]  D-MANNOSE PO Take 4,000 mg by mouth in the morning and at bedtime.   Yes [provider]  DULoxetine  (CYMBALTA ) 60 MG capsule Take 1 capsule (60 mg total) by mouth daily. 07/11/23  Yes Bedsole, Amy E, MD  furosemide  (LASIX ) 40 MG tablet Take 40 mg by mouth daily.   Yes [provider]  HYDROcodone -acetaminophen  (NORCO/VICODIN) 5-325 MG tablet Take 1 tablet by mouth in the morning and at bedtime. 02/16/24  Yes [provider]  hydrOXYzine  (ATARAX ) 25 MG tablet Take 25 mg by mouth 2 (two) times daily as needed for anxiety.   Yes [provider]  insulin  lispro  (HUMALOG) 100 UNIT/ML injection Inject into the skin 3 (three) times daily before meals.   Yes [provider]  nitrofurantoin , macrocrystal-monohydrate, (MACROBID ) 100 MG capsule Take 100 mg by mouth 2 (two) times daily.   Yes [provider]  pantoprazole  (PROTONIX ) 40 MG tablet TAKE 1 TABLET BY MOUTH EVERY DAY 09/17/19  Yes Wendling, Mabel Mt, DO  polyethylene glycol (MIRALAX  / GLYCOLAX ) 17 g packet Take 17 g by mouth daily as needed for mild constipation. 06/09/23  Yes Hongalgi, Anand D, MD  predniSONE  (DELTASONE ) 10 MG tablet Take 10 mg by mouth daily with breakfast.   Yes [provider]  promethazine  (PHENERGAN ) 12.5 MG tablet Take 12.5 mg by mouth 2 (two) times daily as needed for nausea. 02/16/24  Yes [provider]  traMADol  (ULTRAM ) 50 MG tablet Take 1 tablet (50 mg total) by mouth every 8 (eight) hours as needed for severe pain (pain score 7-10). 10/21/23  Yes Amin, Ankit C, MD  UNABLE TO FIND Take 1 tablet by mouth in the morning and at bedtime. Med Name: Korene NOVAK Vitamin   Yes [provider]  DULoxetine  (CYMBALTA ) 30 MG capsule Take 1 capsule (30 mg total) by mouth daily. Take along with 60 mg for 90 mg total daily. Patient not taking: Reported on 02/26/2024 11/18/23   Avelina Greig [name], MD  oxybutynin (DITROPAN-XL) 10 MG 24 hr tablet Take 10 mg by mouth daily. Patient not taking: Reported on 02/26/2024    [provider]    Physical Exam: Vitals:   02/26/24 1948 02/26/24 1949 02/26/24 2018  BP: (!) 151/64  130/78  Pulse: 98  91  Resp: 16  13  Temp: 98 F (36.7 C)    TempSrc: Oral    SpO2: 100%  97%  Weight:  84.8 kg   Height:  5' 3 (1.6 m)     Constitutional: NAD, calm, comfortable.  Pleasant interactive. Vitals:   02/26/24 1948 02/26/24 1949 02/26/24 2018  BP: (!) 151/64  130/78  Pulse: 98  91  Resp: 16  13  Temp: 98 F (36.7 C)    TempSrc: Oral    SpO2: 100%  97%  Weight:  84.8 kg   Height:  5' 3 (1.6 m)     Eyes: PERRL, lids and conjunctivae normal ENMT: Mucous membranes are moist. Posterior pharynx clear of any exudate or lesions.Normal dentition.  Neck: normal, supple, no masses, no thyromegaly Respiratory: clear to auscultation bilaterally, no wheezing, no crackles. Normal respiratory effort. No accessory muscle use.  She does have some back pain while taking deep breaths. Cardiovascular: Regular rate and rhythm, no murmurs / rubs / gallops.  Trace edema left.  No edema on the right leg.  [place]' sign negative.  2+ pedal pulses. No carotid bruits.  Abdomen: no tenderness, no masses palpated. No hepatosplenomegaly. Bowel sounds positive.  Musculoskeletal: no clubbing / cyanosis. No joint deformity upper and lower extremities. Good ROM, no contractures. Normal muscle tone.  Skin: no rashes, lesions, ulcers. No induration Neurologic: CN 2-12 grossly intact. Sensation intact, DTR normal. Strength 5/5 in all 4.  Psychiatric: Normal judgment and insight. Alert and oriented x 3. Normal mood.     Labs on Admission: I have personally reviewed following labs and imaging studies  CBC: Recent Labs  Lab 02/26/24 2002  WBC 3.0*  HGB 13.9  HCT 43.4  MCV 91.4  PLT 126*   Basic Metabolic Panel: Recent Labs  Lab 02/26/24 1916 02/26/24 2002  NA  --  132*  K  --  4.6  CL  --  99  CO2  --  20*  GLUCOSE  --  431*  BUN  --  17  CREATININE 1.20* 1.21*  CALCIUM  --  9.3   GFR: Estimated Creatinine Clearance: 39.6 mL/min (A) (by C-G formula based on SCr of 1.21 mg/dL (H)). Liver Function Tests: Recent Labs  Lab 02/26/24 2002  AST 59*  ALT 41  ALKPHOS 250*  BILITOT 2.0*  PROT 6.7  ALBUMIN 3.3*   No results for input(s): LIPASE, AMYLASE in the last 168 hours. No results for input(s): AMMONIA in the last 168 hours. Coagulation Profile: No results for input(s): INR, PROTIME in the last 168 hours. Cardiac Enzymes: No results for input(s): CKTOTAL, CKMB, CKMBINDEX,  TROPONINI in the last 168 hours. BNP (last 3 results) No results for input(s): PROBNP in the last 8760 hours. HbA1C: No results for input(s): HGBA1C in the last 72 hours. CBG: No results for input(s): GLUCAP in the last 168 hours. Lipid Profile: No results for input(s): CHOL, HDL, LDLCALC, TRIG, CHOLHDL, LDLDIRECT in the last 72 hours. Thyroid  Function Tests: No results for input(s): TSH, T4TOTAL, FREET4, T3FREE, THYROIDAB in the last 72 hours. Anemia Panel: No results for input(s): VITAMINB12, FOLATE, FERRITIN, TIBC, IRON, RETICCTPCT in the last 72 hours. Urine analysis:    Component Value Date/Time   COLORURINE AMBER (A) 10/18/2023 0350   APPEARANCEUR HAZY (A) 10/18/2023 0350   LABSPEC 1.017 10/18/2023 0350   PHURINE 5.0 10/18/2023 0350   GLUCOSEU >=500 (A) 10/18/2023 0350   HGBUR NEGATIVE 10/18/2023 0350   HGBUR moderate 03/28/2010 0931   BILIRUBINUR NEGATIVE 10/18/2023 0350   BILIRUBINUR Negative 09/21/2021 1614   KETONESUR NEGATIVE 10/18/2023 0350   PROTEINUR NEGATIVE 10/18/2023 0350   UROBILINOGEN 0.2 09/21/2021 1614   UROBILINOGEN 0.2 03/28/2010 0931   NITRITE NEGATIVE 10/18/2023 0350   LEUKOCYTESUR MODERATE (A) 10/18/2023 0350    Radiological Exams on Admission: CT CHEST ABDOMEN PELVIS W CONTRAST Result Date: 02/26/2024 CLINICAL DATA:  Kidney cancer, post nephrectomy, monitor surveillance visit for history of RCC s/p nephrectomy. lung nodules need to monitor. EXAM: CT CHEST, ABDOMEN, AND  PELVIS WITH CONTRAST TECHNIQUE: Multidetector CT imaging of the chest, abdomen and pelvis was performed following the standard protocol during bolus administration of intravenous contrast. RADIATION DOSE REDUCTION: This exam was performed according to the departmental dose-optimization program which includes automated exposure control, adjustment of the mA and/or kV according to patient size and/or use of iterative reconstruction technique. CONTRAST:   OMNIPAQUE  IOHEXOL  300 MG/ML  SOLN COMPARISON:  CT chest abdomen pelvis 10/18/2023 FINDINGS: CT CHEST FINDINGS Cardiovascular: There is a main pulmonary artery filling defect extending to the right main pulmonary artery as well as right upper and right middle segmental pulmonary arteries. Normal heart size. No significant pericardial effusion. The thoracic aorta is normal in caliber. No atherosclerotic plaque of the thoracic aorta. No coronary artery calcifications. Mitral annular calcification. Mediastinum/Nodes: No enlarged mediastinal, hilar, or axillary lymph nodes. Thyroid  gland, trachea, and esophagus demonstrate no significant findings. Lungs/Pleura: Stable right lower lobe paravertebral mucous plugging (7:1 20-127). Similar finding within the peripheral left lower lobe (7:104) with associated stable cluster of calcified pulmonary micronodules. Stable pulmonary punctate micronodule within the left lower lobe (7:94). Stable 5 mm left lower lobe subpleural nodule (7:68). Subpleural triangular lymph node along the left major fissure likely a intrapulmonary lymph node-no further follow-up indicated (7:90). No pulmonary mass. No pleural effusion. No pneumothorax. Musculoskeletal: No chest wall abnormality. No suspicious lytic or blastic osseous lesions. No acute displaced fracture. Multilevel degenerative changes of the spine. CT ABDOMEN PELVIS FINDINGS Hepatobiliary: Persistent mild nodular hepatic contour. No focal liver abnormality. Status post cholecystectomy. No biliary dilatation. Pancreas: Stable hypodense pancreatic lesions. Redemonstration of a 1.4 cm hypodense fluid density lesion along the proximal pancreas, a 1.4 cm fluid dense lesion along the pancreatic tail, a 0.6 cm distal pancreatic body hypodensity (2:61). Normal pancreatic contour. No surrounding inflammatory changes. No main pancreatic ductal dilatation. Spleen: Normal in size without focal abnormality. Adrenals/Urinary Tract: No adrenal  nodule bilaterally. Status post left nephrectomy. The right kidney enhances homogeneously. No right renal mass. No hydronephrosis. No hydroureter.  No nephroureterolithiasis. The urinary bladder is unremarkable. On delayed imaging, there is no urothelial wall thickening and there are no filling defects in the opacified portions of the right collecting system. Stomach/Bowel: Stomach is within normal limits. No evidence of bowel wall thickening or dilatation. Colonic diverticulosis. Status post appendectomy. Vascular/Lymphatic: No abdominal aorta or iliac aneurysm. Severe atherosclerotic plaque of the aorta and its branches. No abdominal, pelvic, or inguinal lymphadenopathy. Reproductive: Status post hysterectomy. No adnexal masses. Other: No intraperitoneal free fluid. No intraperitoneal free gas. No organized fluid collection. Musculoskeletal: No abdominal wall hernia or abnormality. No suspicious lytic or blastic osseous lesions. No acute displaced fracture. Multilevel severe degenerative changes of the spine. IMPRESSION: 1. Main, right main, and segmental right upper and middle lobe pulmonary emboli. Associated right heart strain. No pulmonary infarction. 2. No findings to suggest recurrent or metastatic malignancy in a patient with prior RCC status post left nephrectomy. Other imaging findings of potential clinical significance: 1. Stable bilateral lower lobe mucous plugging. Associated cluster of stable calcified pulmonary micronodules within the left lower lobe. 2. Couple stable pulmonary micronodules within the left lower lobe measuring up to 5 mm. 3. Stable hypodense pancreatic lesions. 4. Cirrhosis. No focal liver lesions identified. Please note that liver protocol enhanced MR and CT are the most sensitive tests for the screening detection of hepatocellular carcinoma in the high risk setting of cirrhosis. 5. Colonic diverticulosis with no acute diverticulitis. 6.  Aortic Atherosclerosis (ICD10-I70.0). These  results  were called by telephone at the time of interpretation on 02/26/2024 at 7:59 pm to provider Christina Bracey traige nurse who verbally acknowledged these results. Electronically Signed   By: Morgane  Naveau M.D.   On: 02/26/2024 20:13    EKG: Independently reviewed.  Sinus rhythm.  No acute ST-T wave changes.  Assessment/Plan Principal Problem:   Acute pulmonary embolus (HCC) Active Problems:   Poorly controlled type 2 diabetes mellitus (HCC)   HYPERCHOLESTEROLEMIA   GERD   Acute bilateral low back pain without sciatica   Recurrent UTI   Autoimmune hepatitis (HCC)   Renal cell cancer (HCC)   CKD stage 3a, GFR 45-59 ml/min (HCC)     Acute segmental pulmonary embolism with cor pulmonale: Patient is fairly stable today.  She is 100% on room air.  Started on heparin  infusion.  Monitor overnight.  Echocardiogram to be done, however patient is less likely candidate for thrombectomy or thrombolysis.  If tolerates heparin  without problem, she can likely go home with NOAC like Eliquis  and follow-up with her oncologist as outpatient.       Will check duplexes of the lower extremities to establish source of blood clot.       Primary cause unknown.  Likely immobility.  Recurrence of cancer is always considered when  patient has large amount of blood clot, oncologist to follow-up.  CT scan chest abdomen pelvis with no evidence of structural tumor. Case was discussed with pulmonary by ER physician who recommended conservative management with heparin .  2.  Type 2 diabetes, uncontrolled with hyperglycemia: Inconsistently using insulin  pump because it was disconnected per CT scan.  Blood sugars 400.  Start subcu insulin  with long-acting insulin  and sliding scale as she does not have her insulin  pump today with her.  3.  Recurrent UTI: On chronic suppressive therapy with Macrobid .  Continue.  4.  Autoimmune hepatitis with cirrhosis of liver: Currently stable.  Platlet counts are stable and she  will likely tolerate anticoagulation without difficulties. Patient is on chronic maintenance therapy with prednisone  10 mg daily.  5.  CKD stage IIIa: At about baseline.  6.  Mucous plugging and cough: Mucinex , bronchodilator therapy, incentive spirometer and flutter valve therapy.  Currently no indication for antibiotics use.  Other chronic medical issues, continue home medications including pain medications, Protonix .    DVT prophylaxis: Heparin  infusion Code Status: Full code Family Communication: Daughter at the bedside Disposition Plan: Likely home tomorrow Consults called: None.  Pulmonary consultation by ER Admission status: Observation.  Stepdown for titratable heparin .   Renato Applebaum MD [facility]

## 2024-02-26 NOTE — ED Triage Notes (Signed)
 Pt. Arrives from outpatient CT for abnormal scans. Pt. States that she was getting the scan for a cough so her PCP sent her. Denies CP and SOB. Also states that she is have burning with urination. Hx of frequent uti.

## 2024-02-27 ENCOUNTER — Telehealth (HOSPITAL_COMMUNITY): Payer: Self-pay | Admitting: Pharmacy Technician

## 2024-02-27 ENCOUNTER — Inpatient Hospital Stay (HOSPITAL_COMMUNITY)

## 2024-02-27 ENCOUNTER — Observation Stay (HOSPITAL_COMMUNITY)

## 2024-02-27 ENCOUNTER — Other Ambulatory Visit (HOSPITAL_COMMUNITY): Payer: Self-pay

## 2024-02-27 DIAGNOSIS — Z87440 Personal history of urinary (tract) infections: Secondary | ICD-10-CM | POA: Diagnosis not present

## 2024-02-27 DIAGNOSIS — M79662 Pain in left lower leg: Secondary | ICD-10-CM | POA: Diagnosis not present

## 2024-02-27 DIAGNOSIS — I2602 Saddle embolus of pulmonary artery with acute cor pulmonale: Secondary | ICD-10-CM

## 2024-02-27 DIAGNOSIS — K219 Gastro-esophageal reflux disease without esophagitis: Secondary | ICD-10-CM | POA: Diagnosis present

## 2024-02-27 DIAGNOSIS — R2689 Other abnormalities of gait and mobility: Secondary | ICD-10-CM | POA: Diagnosis not present

## 2024-02-27 DIAGNOSIS — I480 Paroxysmal atrial fibrillation: Secondary | ICD-10-CM | POA: Diagnosis present

## 2024-02-27 DIAGNOSIS — Z905 Acquired absence of kidney: Secondary | ICD-10-CM | POA: Diagnosis not present

## 2024-02-27 DIAGNOSIS — E669 Obesity, unspecified: Secondary | ICD-10-CM | POA: Diagnosis present

## 2024-02-27 DIAGNOSIS — I2699 Other pulmonary embolism without acute cor pulmonale: Secondary | ICD-10-CM

## 2024-02-27 DIAGNOSIS — Z85528 Personal history of other malignant neoplasm of kidney: Secondary | ICD-10-CM | POA: Diagnosis not present

## 2024-02-27 DIAGNOSIS — E1142 Type 2 diabetes mellitus with diabetic polyneuropathy: Secondary | ICD-10-CM | POA: Diagnosis present

## 2024-02-27 DIAGNOSIS — Z6833 Body mass index (BMI) 33.0-33.9, adult: Secondary | ICD-10-CM | POA: Diagnosis not present

## 2024-02-27 DIAGNOSIS — T17890A Other foreign object in other parts of respiratory tract causing asphyxiation, initial encounter: Secondary | ICD-10-CM | POA: Diagnosis present

## 2024-02-27 DIAGNOSIS — J4520 Mild intermittent asthma, uncomplicated: Secondary | ICD-10-CM | POA: Diagnosis present

## 2024-02-27 DIAGNOSIS — E7800 Pure hypercholesterolemia, unspecified: Secondary | ICD-10-CM | POA: Diagnosis present

## 2024-02-27 DIAGNOSIS — Z9641 Presence of insulin pump (external) (internal): Secondary | ICD-10-CM | POA: Diagnosis present

## 2024-02-27 DIAGNOSIS — J9601 Acute respiratory failure with hypoxia: Secondary | ICD-10-CM | POA: Diagnosis present

## 2024-02-27 DIAGNOSIS — E1122 Type 2 diabetes mellitus with diabetic chronic kidney disease: Secondary | ICD-10-CM | POA: Diagnosis present

## 2024-02-27 DIAGNOSIS — I5032 Chronic diastolic (congestive) heart failure: Secondary | ICD-10-CM | POA: Diagnosis present

## 2024-02-27 DIAGNOSIS — K754 Autoimmune hepatitis: Secondary | ICD-10-CM | POA: Diagnosis present

## 2024-02-27 DIAGNOSIS — W44F9XA Other object of natural or organic material, entering into or through a natural orifice, initial encounter: Secondary | ICD-10-CM | POA: Diagnosis present

## 2024-02-27 DIAGNOSIS — E1165 Type 2 diabetes mellitus with hyperglycemia: Secondary | ICD-10-CM | POA: Diagnosis present

## 2024-02-27 DIAGNOSIS — Z832 Family history of diseases of the blood and blood-forming organs and certain disorders involving the immune mechanism: Secondary | ICD-10-CM | POA: Diagnosis not present

## 2024-02-27 DIAGNOSIS — N1831 Chronic kidney disease, stage 3a: Secondary | ICD-10-CM | POA: Diagnosis present

## 2024-02-27 DIAGNOSIS — N390 Urinary tract infection, site not specified: Secondary | ICD-10-CM | POA: Diagnosis present

## 2024-02-27 DIAGNOSIS — Z794 Long term (current) use of insulin: Secondary | ICD-10-CM | POA: Diagnosis not present

## 2024-02-27 DIAGNOSIS — I2609 Other pulmonary embolism with acute cor pulmonale: Secondary | ICD-10-CM | POA: Diagnosis present

## 2024-02-27 DIAGNOSIS — K7460 Unspecified cirrhosis of liver: Secondary | ICD-10-CM | POA: Diagnosis present

## 2024-02-27 DIAGNOSIS — D696 Thrombocytopenia, unspecified: Secondary | ICD-10-CM | POA: Diagnosis present

## 2024-02-27 LAB — TROPONIN I (HIGH SENSITIVITY): Troponin I (High Sensitivity): 15 ng/L (ref <18)

## 2024-02-27 LAB — ECHOCARDIOGRAM COMPLETE
Area-P 1/2: 3.08 cm2
Calc EF: 73 %
Height: 63 in
S' Lateral: 3.1 cm
Single Plane A2C EF: 81.1 %
Single Plane A4C EF: 66.8 %
Weight: 2955.93 [oz_av]

## 2024-02-27 LAB — GLUCOSE, CAPILLARY
Glucose-Capillary: 361 mg/dL — ABNORMAL HIGH (ref 70–99)
Glucose-Capillary: 338 mg/dL — ABNORMAL HIGH (ref 70–99)
Glucose-Capillary: 352 mg/dL — ABNORMAL HIGH (ref 70–99)
Glucose-Capillary: 357 mg/dL — ABNORMAL HIGH (ref 70–99)
Glucose-Capillary: 345 mg/dL — ABNORMAL HIGH (ref 70–99)

## 2024-02-27 LAB — CBC
HCT: 38.1 % (ref 36.0–46.0)
Hemoglobin: 12.6 g/dL (ref 12.0–15.0)
MCH: 30.4 pg (ref 26.0–34.0)
MCHC: 33.1 g/dL (ref 30.0–36.0)
MCV: 91.8 fL (ref 80.0–100.0)
Platelets: 121 K/uL — ABNORMAL LOW (ref 150–400)
RBC: 4.15 MIL/uL (ref 3.87–5.11)
RDW: 13.4 % (ref 11.5–15.5)
WBC: 3.9 K/uL — ABNORMAL LOW (ref 4.0–10.5)
nRBC: 0 % (ref 0.0–0.2)

## 2024-02-27 LAB — MRSA NEXT GEN BY PCR, NASAL: MRSA by PCR Next Gen: NOT DETECTED

## 2024-02-27 LAB — HEPARIN LEVEL (UNFRACTIONATED): Heparin Unfractionated: 0.98 [IU]/mL — ABNORMAL HIGH (ref 0.30–0.70)

## 2024-02-27 MED ORDER — SODIUM CHLORIDE 3 % IN NEBU
4.0000 mL | INHALATION_SOLUTION | Freq: Every day | RESPIRATORY_TRACT | Status: DC
  Administered 2024-02-27 – 2024-02-28 (×2): 4 mL via RESPIRATORY_TRACT
  Filled 2024-02-27 (×2): qty 4

## 2024-02-27 MED ORDER — ONDANSETRON HCL 4 MG/2ML IJ SOLN
4.0000 mg | Freq: Four times a day (QID) | INTRAMUSCULAR | Status: DC | PRN
  Administered 2024-02-27 – 2024-02-28 (×2): 4 mg via INTRAVENOUS
  Filled 2024-02-27 (×2): qty 2

## 2024-02-27 MED ORDER — APIXABAN 5 MG PO TABS: 5.0000 mg | ORAL_TABLET | Freq: Two times a day (BID) | ORAL | Status: DC

## 2024-02-27 MED ORDER — APIXABAN 5 MG PO TABS
10.0000 mg | ORAL_TABLET | Freq: Two times a day (BID) | ORAL | Status: DC
  Administered 2024-02-27 – 2024-02-28 (×3): 10 mg via ORAL
  Filled 2024-02-27 (×3): qty 2

## 2024-02-27 NOTE — Progress Notes (Signed)
 VASCULAR LAB    Bilateral lower extremity venous duplex has been performed.  See CV proc for preliminary results.   Jamisha Hoeschen, RVT 02/27/2024, 4:28 PM

## 2024-02-27 NOTE — Plan of Care (Signed)
  Problem: Coping: Goal: Ability to adjust to condition or change in health will improve Outcome: Progressing   Problem: Fluid Volume: Goal: Ability to maintain a balanced intake and output will improve Outcome: Progressing   Problem: Health Behavior/Discharge Planning: Goal: Ability to identify and utilize available resources and services will improve Outcome: Progressing   Problem: Nutritional: Goal: Maintenance of adequate nutrition will improve Outcome: Progressing   Problem: Education: Goal: Knowledge of General Education information will improve Description: Including pain rating scale, medication(s)/side effects and non-pharmacologic comfort measures Outcome: Progressing

## 2024-02-27 NOTE — Progress Notes (Signed)
  Echocardiogram 2D Echocardiogram has been performed.  Devora Ellouise SAUNDERS 02/27/2024, 10:03 AM

## 2024-02-27 NOTE — Inpatient Diabetes Management (Signed)
 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 Tommas   MD- Based on below conversation, please consider:  1. Increase the Semglee  to 35 units at bedtime  2. Start low dose Novolog  Meal Coverage: Novolog  4 units TID with meals HOLD if pt NPO HOLD if pt eats <50% meals     Met w/ pt this AM (11:20am).  Pt told me she took her insulin  pump off prior to coming in.  Does not know any of her pump settings--Told me her daughter Rico helps her with her pump at home.  Confirms Dr. Tommas is her ENDO.  Uses the Medtronic 780 g pump with the Guardian glucose sensor.  Discussed with pt that I will call her Dtr to see if her Dtr can review pump settings with me--Pt OK with this plan since Dtr helps her set up the pump each time.  Discussed with pt that we will give her 24hr acting insulin  and rapid acting insulin  to cover her insulin  needs while she is inpatient.  Pt OK with this.  I called pt's Dtr [name].  [name] told me pt's basal rate is 1.65 units/hr (total of 39.6 units per 24H period).  Dtr does not know correction factor nor carb ratio but she did tell me pt does  bolus at meal times.  Pt's Dtr also told me pt received a total of 150 mg Prednisone  in divided doses prior to her CT scan the other day for Contrast allergy.  Discussed with Dtr that we will try to match the amount of insulin  pt gets on her pump with 24H insulin  (Semglee ) and Novolog  with meals.  Explained to Dtr that pt should not resume the basal rates on her insulin  pump until 24 hours after the last dose Semglee  insulin  has been given to avoid risk for HYPO with both basals insulins on board (Semglee  and basal rate on pump).  Dtr stated agreement and understanding.  Dtr told me they are waiting for shipment of Glucose sensors to arrive and she won't be able to restart pt's pump until the sensors arrive.  Dtr told me they do have back up/ off insulin  pump plan and can give SQ Insulin  until the sensors arrive.  Dtr very appreciative of call.  I did attempt to call Dr. Laquita office to speak to someone and get a review of pt's insulin  pump settings, however, Dr. Tommas is out of the office until Monday and phone rep told me Dr. Becki nurse cannot help me with this request.      --  Will follow patient during hospitalization--  Adina Rudolpho Arrow RN, MSN, CDCES Diabetes Coordinator Inpatient Glycemic Control Team Team Pager: [phone] (8a-5p)

## 2024-02-27 NOTE — Discharge Instructions (Signed)
 Information on my medicine - ELIQUIS  (apixaban )  This medication education was reviewed with me or my healthcare representative as part of my discharge preparation.   Why was Eliquis  prescribed for you? Eliquis  was prescribed to treat blood clots that may have been found in the veins of your legs (deep vein thrombosis) or in your lungs (pulmonary embolism) and to reduce the risk of them occurring again.  What do You need to know about Eliquis  ? The starting dose is 10 mg (two 5 mg tablets) taken TWICE daily for the FIRST SEVEN (7) DAYS, then on 03/05/2024  the dose is reduced to ONE 5 mg tablet taken TWICE daily.  Eliquis  may be taken with or without food.   Try to take the dose about the same time in the morning and in the evening. If you have difficulty swallowing the tablet whole please discuss with your pharmacist how to take the medication safely.  Take Eliquis  exactly as prescribed and DO NOT stop taking Eliquis  without talking to the doctor who prescribed the medication.  Stopping may increase your risk of developing a new blood clot.  Refill your prescription before you run out.  After discharge, you should have regular check-up appointments with your healthcare provider that is prescribing your Eliquis .    What do you do if you miss a dose? If a dose of ELIQUIS  is not taken at the scheduled time, take it as soon as possible on the same day and twice-daily administration should be resumed. The dose should not be doubled to make up for a missed dose.  Important Safety Information A possible side effect of Eliquis  is bleeding. You should call your healthcare provider right away if you experience any of the following: Bleeding from an injury or your nose that does not stop. Unusual colored urine (red or dark brown) or unusual colored stools (red or black). Unusual bruising for unknown reasons. A serious fall or if you hit your head (even if there is no bleeding).  Some  medicines may interact with Eliquis  and might increase your risk of bleeding or clotting while on Eliquis . To help avoid this, consult your healthcare provider or pharmacist prior to using any new prescription or non-prescription medications, including herbals, vitamins, non-steroidal anti-inflammatory drugs (NSAIDs) and supplements.  This website has more information on Eliquis  (apixaban ): [email or web address] dena

## 2024-02-27 NOTE — Plan of Care (Signed)
  Problem: Fluid Volume: Goal: Ability to maintain a balanced intake and output will improve Outcome: Progressing   Problem: Health Behavior/Discharge Planning: Goal: Ability to identify and utilize available resources and services will improve Outcome: Progressing Goal: Ability to manage health-related needs will improve Outcome: Progressing   Problem: Nutritional: Goal: Maintenance of adequate nutrition will improve Outcome: Progressing   Problem: Skin Integrity: Goal: Risk for impaired skin integrity will decrease Outcome: Progressing   Problem: Tissue Perfusion: Goal: Adequacy of tissue perfusion will improve Outcome: Progressing

## 2024-02-27 NOTE — Progress Notes (Signed)
 PHARMACY - ANTICOAGULATION CONSULT NOTE  Pharmacy Consult for IV heparin  Indication: pulmonary embolus  Allergies  Allergen Reactions   Codeine  Other (See Comments)    Unknown    Doxycycline  Hives   Epinephrine Hcl Other (See Comments)    Had tremors when it was given.   Erythromycin Ethylsuccinate Other (See Comments)    Abdominal spasms   Eucalyptus Oil Other (See Comments)    Unknown    Iohexol  Hives    Pt needs premedicated-- hives on prev contrast study per md office    Menthol Other (See Comments)    Unknown    Metformin  Diarrhea   Oxycodone  Other (See Comments)    felt like I was dying   Tape Rash    Patient Measurements: Height: 5' 3 (160 cm) Weight: 83.8 kg (184 lb 11.9 oz) IBW/kg (Calculated) : 52.4 HEPARIN  DW (KG): 71  Vital Signs: Temp: 98 F (36.7 C) (08/08 0400) Temp Source: Oral (08/08 0400) BP: 131/53 (08/08 0500) Pulse Rate: 65 (08/08 0500)  Labs: Recent Labs    02/26/24 1916 02/26/24 2002 02/26/24 2342 02/27/24 0456  HGB  --  13.9  --  12.6  HCT  --  43.4  --  38.1  PLT  --  126*  --  121*  HEPARINUNFRC  --   --   --  0.98*  CREATININE 1.20* 1.21*  --   --   TROPONINIHS  --  15 15  --     Estimated Creatinine Clearance: 39.3 mL/min (A) (by C-G formula based on SCr of 1.21 mg/dL (H)).   Medical History: Past Medical History:  Diagnosis Date   Allergic rhinitis, cause unspecified    Anxiety    Arthritis    hands, feet and back   Asthma    Benign neoplasm of colon    Bursitis    hips   CHF (congestive heart failure) (HCC)    Cirrhosis (HCC)    auto immune   Complication of anesthesia     i WAKE UP WITH TREMORS  after shoulder surgery tremors for 10 months after surgery surgery- 2009- surgical center in [place]   Cyst and pseudocyst of pancreas    Depressive disorder, not elsewhere classified    Diabetes mellitus without complication (HCC)    Diverticulitis    Diverticulosis of colon (without mention of hemorrhage)     Dyspnea    Dysrhythmia    arrhythmia   Esophageal reflux    Essential and other specified forms of tremor    History of GI bleed    History of kidney stones    Irritable bowel syndrome    Obesity, unspecified    Other specified cardiac dysrhythmias(427.89)    Periapical abscess without sinus    Polyneuropathy in diabetes(357.2)    PONV (postoperative nausea and vomiting)    Pure hypercholesterolemia    Renal cell carcinoma (HCC)    Sleep apnea    CPAP- non compliant   Type II or unspecified type diabetes mellitus with neurological manifestations, not stated as uncontrolled(250.60)     Medications:  Medications Prior to Admission  Medication Sig Dispense Refill Last Dose/Taking   albuterol  (PROAIR  HFA) 108 (90 Base) MCG/ACT inhaler Inhale 2 puffs into the lungs every 6 (six) hours as needed for wheezing or shortness of breath. 8 g 2 Unknown   atenolol  (TENORMIN ) 25 MG tablet TAKE 1 TABLET (25 MG TOTAL) BY MOUTH DAILY. 90 tablet 3 02/25/2024   cetirizine (ZYRTEC) 10 MG tablet Take  10 mg by mouth daily.   02/25/2024   Coenzyme Q10 100 MG TABS Take 100 mg by mouth daily.   02/25/2024   CRANBERRY PO Take 1,000 mg by mouth in the morning and at bedtime.   02/25/2024   D-MANNOSE PO Take 4,000 mg by mouth in the morning and at bedtime.   02/25/2024   DULoxetine  (CYMBALTA ) 60 MG capsule Take 1 capsule (60 mg total) by mouth daily. 90 capsule 3 02/25/2024   furosemide  (LASIX ) 40 MG tablet Take 40 mg by mouth daily.   02/25/2024   HYDROcodone -acetaminophen  (NORCO/VICODIN) 5-325 MG tablet Take 1 tablet by mouth in the morning and at bedtime.   02/25/2024 Bedtime   hydrOXYzine  (ATARAX ) 25 MG tablet Take 25 mg by mouth 2 (two) times daily as needed for anxiety.   Unknown   insulin  lispro (HUMALOG) 100 UNIT/ML injection Inject into the skin 3 (three) times daily before meals.   02/25/2024   nitrofurantoin , macrocrystal-monohydrate, (MACROBID ) 100 MG capsule Take 100 mg by mouth 2 (two) times daily.   02/25/2024    pantoprazole  (PROTONIX ) 40 MG tablet TAKE 1 TABLET BY MOUTH EVERY DAY 30 tablet 0 02/25/2024   polyethylene glycol (MIRALAX  / GLYCOLAX ) 17 g packet Take 17 g by mouth daily as needed for mild constipation. 14 each 0 Unknown   predniSONE  (DELTASONE ) 10 MG tablet Take 10 mg by mouth daily with breakfast.   02/25/2024   promethazine  (PHENERGAN ) 12.5 MG tablet Take 12.5 mg by mouth 2 (two) times daily as needed for nausea.   02/25/2024   traMADol  (ULTRAM ) 50 MG tablet Take 1 tablet (50 mg total) by mouth every 8 (eight) hours as needed for severe pain (pain score 7-10). 20 tablet 0 Unknown   UNABLE TO FIND Take 1 tablet by mouth in the morning and at bedtime. Med Name: Nervive B Vitamin   02/25/2024   DULoxetine  (CYMBALTA ) 30 MG capsule Take 1 capsule (30 mg total) by mouth daily. Take along with 60 mg for 90 mg total daily. (Patient not taking: Reported on 02/26/2024)   Not Taking   oxybutynin (DITROPAN-XL) 10 MG 24 hr tablet Take 10 mg by mouth daily. (Patient not taking: Reported on 02/26/2024)   Not Taking    Assessment: Pharmacy is consulted to start heparin  drip on 78 yo female diagnosed with PE. PMH is significant for RCC status post nephrectomy, asthma, CHF, autoimmune cirrhosis, DM, chronic back pain, paroxysmal atrial fibrillation who presents to the ED with complaint of abnormal CT scan. CT imaging of her chest abdomen pelvis and incidentally was found to have a large pulmonary embolus. No notes anticoagulation PTA.    Today, 02/27/24 HL 0.98 supra-therapeutic on 1200 units/hr Hgb 12.6, plts 121 Per RN no bleeding noted  Goal of Therapy:  Heparin  level 0.3-0.7 units/ml Monitor platelets by anticoagulation protocol: Yes   Plan:  Decrease heparin  drip to 1000 units/hr Heparin  level in 8 hours Daily CBC, HL while on heparin  Monitor for signs and symptoms of bleeding     Leeroy Mace RPh 02/27/2024, 5:52 AM

## 2024-02-27 NOTE — Progress Notes (Signed)
 PROGRESS NOTE  [name] [name]  FMW:[id number] DOB: [birth year] DOA: 02/26/2024 PCP: Avelina Greig [name], MD   Brief Narrative: Patient is a 78 year old female with history of renal cell carcinoma status post nephrectomy and now with solitary kidney, diabetes type 2 on insulin  pump, diastolic CHF, autoimmune hepatitis/cirrhosis, anxiety, GERD, hyperlipidemia who recently underwent CT scanning of the chest/abdomen/pelvis for lungs and was found to have PE in the right main, segmental right upper and middle lobe pulmonary artery with associated right heart strain, no evidence of pulmonary infarction.  Also noted to have mucous plugging of the lower lobes.  She was asked to come to the emergency department.  Reported worsening of cough for last 2 weeks, no shortness of breath has chronic bilateral lower EXTR edema and has been in bed most of the time with less activities for last 2 weeks but carries out ADLs by herself and independent.  On presentation, she was hemodynamically stable.  Lab work showed platelets of 126, glucose of 431, creatinine of 1.2.  Started on heparin  drip.  Case was discussed with pulmonary medicine and they advised no need for mechanical thrombectomy thrombolytics.  Plan to transition to Eliquis  today, physical therapy evaluation  Assessment & Plan:  Principal Problem:   Acute pulmonary embolus (HCC) Active Problems:   Poorly controlled type 2 diabetes mellitus (HCC)   HYPERCHOLESTEROLEMIA   GERD   Acute bilateral low back pain without sciatica   Recurrent UTI   Autoimmune hepatitis (HCC)   Renal cell cancer (HCC)   CKD stage 3a, GFR 45-59 ml/min (HCC)   Acute PE: Incidentally found to have PE.  CT imaging showed main, right main, and segmental right upper and middle lobe pulmonary emboli. Associated right heart strain. No pulmonary infarction.  Currently heparin  drip.  Plan to transition to Eliquis .  Echo pending.  Case was discussed with PCCM, see note candidate for thrombectomy or  thrombolysis.  Bilateral lower extremity Doppler pending  Acute hypoxic respiratory failure: Was on 2 L of oxygen this morning, weaned to room air now.  Was having some cough issues since last few weeks at home.  Hypoxia secondary to PE and bilateral lower lobes mucous plugs.  Continue Mucinex , bronchodilator therapy, incentive spirometer.   We can try hypertonic saline nebulization  Renal cell carcinoma: Status post nephrectomy.  CT abdomen/chest/pelvis did not show any signs of recurrence or metastatic disease.  Showed stable pulmonary micronodules within the left lower lobe measuring up to 5 mm,stable hypodense pancreatic lesions.  Follow-up with Dr. Federico.  Diabetes type 2/hyperglycemia: Uses insulin  pump at home but inconsistent.  Hyperglycemic on presentation.  Continue current insulin  regimen.  Monitor blood sugars.  Insulin  pump on discharge to be continued.  Hemoglobin A1c recently was 7.9.  Diabetic coordinator consulted  Recurrent UTI: On chronic suppressive therapy with Macrobid   Autoimmune hepatitis/cirrhosis of liver: Has stable thrombocytopenia.  Also on chronic maintenance therapy with prednisone  10 mg daily.  CT imaging showed cirrhosis.  Has mildly elevated liver enzymes and ALP.  Follows with Dr. Rollin  CKD stage IIIa: Currently kidney function at baseline  GERD: Continue Protonix   Back pain/chronic debility: Patient has chronic back pain.  Lives alone.  Falls frequently.  Follows with orthopedic/pain management.  Will consult PT.         DVT prophylaxis:iv heparin      Code Status: Full Code  Family Communication: Called and discussed with Dr. Rico on phone on 8/8  Patient status:Inpatient  Patient is from :Home  Anticipated discharge  un:ynfz  Estimated DC date:1-2 days   Consultants: None  Procedures:None  Antimicrobials:  Anti-infectives (From admission, onward)    Start     Dose/Rate Route Frequency Ordered Stop   02/26/24 2200  nitrofurantoin   (macrocrystal-monohydrate) (MACROBID ) capsule 100 mg        100 mg Oral 2 times daily 02/26/24 2114         Subjective: Patient seen and examined at bedside today.  Hemodynamically stable.  Was on 2 L of oxygen per minute.  Any shortness of breath or cough.  Oxygen weaned down to room air.  Denies chest pain .  Denies new complaints  Objective: Vitals:   02/27/24 0500 02/27/24 0600 02/27/24 0643 02/27/24 0645  BP: (!) 131/53 (!) 112/45    Pulse: 65 62 60 67  Resp: 13 20 17 16   Temp:      TempSrc:      SpO2: 96% 91% 91% 96%  Weight:      Height:        Intake/Output Summary (Last 24 hours) at 02/27/2024 0723 Last data filed at 02/27/2024 0617 Gross per 24 hour  Intake 580.35 ml  Output 600 ml  Net -19.65 ml   Filed Weights   02/26/24 1949 02/26/24 2300  Weight: 84.8 kg 83.8 kg    Examination:  General exam: Overall comfortable, not in distress, pleasant female HEENT: PERRL Respiratory system:  no wheezes or crackles  Cardiovascular system: S1 & S2 heard, RRR.  Gastrointestinal system: Abdomen is nondistended, soft and nontender. Central nervous system: Alert and oriented Extremities: No edema, no clubbing ,no cyanosis Skin: No rashes, no ulcers,no icterus     Data Reviewed: I have personally reviewed following labs and imaging studies  CBC: Recent Labs  Lab 02/26/24 2002 02/27/24 0456  WBC 3.0* 3.9*  HGB 13.9 12.6  HCT 43.4 38.1  MCV 91.4 91.8  PLT 126* 121*   Basic Metabolic Panel: Recent Labs  Lab 02/26/24 1916 02/26/24 2002  NA  --  132*  K  --  4.6  CL  --  99  CO2  --  20*  GLUCOSE  --  431*  BUN  --  17  CREATININE 1.20* 1.21*  CALCIUM  --  9.3     Recent Results (from the past 240 hours)  MRSA Next Gen by PCR, Nasal     Status: None   Collection Time: 02/26/24 10:55 PM   Specimen: Nasal Mucosa; Nasal Swab  Result Value Ref Range Status   MRSA by PCR Next Gen NOT DETECTED NOT DETECTED Final    Comment: (NOTE) The GeneXpert MRSA Assay  (FDA approved for NASAL specimens only), is one component of a comprehensive MRSA colonization surveillance program. It is not intended to diagnose MRSA infection nor to guide or monitor treatment for MRSA infections. Test performance is not FDA approved in patients less than 57 years old. Performed at [facility], [address] [place], [place] [address]      Radiology Studies: CT CHEST ABDOMEN PELVIS W CONTRAST Result Date: 02/26/2024 CLINICAL DATA:  Kidney cancer, post nephrectomy, monitor surveillance visit for history of RCC s/p nephrectomy. lung nodules need to monitor. EXAM: CT CHEST, ABDOMEN, AND PELVIS WITH CONTRAST TECHNIQUE: Multidetector CT imaging of the chest, abdomen and pelvis was performed following the standard protocol during bolus administration of intravenous contrast. RADIATION DOSE REDUCTION: This exam was performed according to the departmental dose-optimization program which includes automated exposure control, adjustment of the mA and/or  kV according to patient size and/or use of iterative reconstruction technique. CONTRAST:  OMNIPAQUE  IOHEXOL  300 MG/ML  SOLN COMPARISON:  CT chest abdomen pelvis 10/18/2023 FINDINGS: CT CHEST FINDINGS Cardiovascular: There is a main pulmonary artery filling defect extending to the right main pulmonary artery as well as right upper and right middle segmental pulmonary arteries. Normal heart size. No significant pericardial effusion. The thoracic aorta is normal in caliber. No atherosclerotic plaque of the thoracic aorta. No coronary artery calcifications. Mitral annular calcification. Mediastinum/Nodes: No enlarged mediastinal, hilar, or axillary lymph nodes. Thyroid  gland, trachea, and esophagus demonstrate no significant findings. Lungs/Pleura: Stable right lower lobe paravertebral mucous plugging (7:1 20-127). Similar finding within the peripheral left lower lobe (7:104) with associated stable cluster of calcified  pulmonary micronodules. Stable pulmonary punctate micronodule within the left lower lobe (7:94). Stable 5 mm left lower lobe subpleural nodule (7:68). Subpleural triangular lymph node along the left major fissure likely a intrapulmonary lymph node-no further follow-up indicated (7:90). No pulmonary mass. No pleural effusion. No pneumothorax. Musculoskeletal: No chest wall abnormality. No suspicious lytic or blastic osseous lesions. No acute displaced fracture. Multilevel degenerative changes of the spine. CT ABDOMEN PELVIS FINDINGS Hepatobiliary: Persistent mild nodular hepatic contour. No focal liver abnormality. Status post cholecystectomy. No biliary dilatation. Pancreas: Stable hypodense pancreatic lesions. Redemonstration of a 1.4 cm hypodense fluid density lesion along the proximal pancreas, a 1.4 cm fluid dense lesion along the pancreatic tail, a 0.6 cm distal pancreatic body hypodensity (2:61). Normal pancreatic contour. No surrounding inflammatory changes. No main pancreatic ductal dilatation. Spleen: Normal in size without focal abnormality. Adrenals/Urinary Tract: No adrenal nodule bilaterally. Status post left nephrectomy. The right kidney enhances homogeneously. No right renal mass. No hydronephrosis. No hydroureter.  No nephroureterolithiasis. The urinary bladder is unremarkable. On delayed imaging, there is no urothelial wall thickening and there are no filling defects in the opacified portions of the right collecting system. Stomach/Bowel: Stomach is within normal limits. No evidence of bowel wall thickening or dilatation. Colonic diverticulosis. Status post appendectomy. Vascular/Lymphatic: No abdominal aorta or iliac aneurysm. Severe atherosclerotic plaque of the aorta and its branches. No abdominal, pelvic, or inguinal lymphadenopathy. Reproductive: Status post hysterectomy. No adnexal masses. Other: No intraperitoneal free fluid. No intraperitoneal free gas. No organized fluid collection.  Musculoskeletal: No abdominal wall hernia or abnormality. No suspicious lytic or blastic osseous lesions. No acute displaced fracture. Multilevel severe degenerative changes of the spine. IMPRESSION: 1. Main, right main, and segmental right upper and middle lobe pulmonary emboli. Associated right heart strain. No pulmonary infarction. 2. No findings to suggest recurrent or metastatic malignancy in a patient with prior RCC status post left nephrectomy. Other imaging findings of potential clinical significance: 1. Stable bilateral lower lobe mucous plugging. Associated cluster of stable calcified pulmonary micronodules within the left lower lobe. 2. Couple stable pulmonary micronodules within the left lower lobe measuring up to 5 mm. 3. Stable hypodense pancreatic lesions. 4. Cirrhosis. No focal liver lesions identified. Please note that liver protocol enhanced MR and CT are the most sensitive tests for the screening detection of hepatocellular carcinoma in the high risk setting of cirrhosis. 5. Colonic diverticulosis with no acute diverticulitis. 6.  Aortic Atherosclerosis (ICD10-I70.0). These results were called by telephone at the time of interpretation on 02/26/2024 at 7:59 pm to provider Christina Bowerston traige nurse who verbally acknowledged these results. Electronically Signed   By: Morgane  Naveau M.D.   On: 02/26/2024 20:13    Scheduled Meds:  atenolol   25  mg Oral Daily   Chlorhexidine  Gluconate Cloth  6 each Topical Daily   dextromethorphan -guaiFENesin   1 tablet Oral BID   DULoxetine   60 mg Oral Daily   furosemide   40 mg Oral Daily   insulin  aspart  0-5 Units Subcutaneous QHS   insulin  aspart  0-9 Units Subcutaneous TID WC   insulin  glargine-yfgn  30 Units Subcutaneous QHS   loratadine   10 mg Oral Daily   nitrofurantoin  (macrocrystal-monohydrate)  100 mg Oral BID   pantoprazole   40 mg Oral Daily   predniSONE   10 mg Oral Q breakfast   Continuous Infusions:  heparin  1,000 Units/hr  (02/27/24 0617)     LOS: 0 days   Ivonne Mustache, MD [facility] P8/02/2024, 7:23 AM

## 2024-02-27 NOTE — Telephone Encounter (Signed)

## 2024-02-28 ENCOUNTER — Other Ambulatory Visit (HOSPITAL_COMMUNITY): Payer: Self-pay

## 2024-02-28 DIAGNOSIS — I2699 Other pulmonary embolism without acute cor pulmonale: Secondary | ICD-10-CM | POA: Diagnosis not present

## 2024-02-28 LAB — BASIC METABOLIC PANEL WITH GFR
Anion gap: 10 (ref 5–15)
BUN: 29 mg/dL — ABNORMAL HIGH (ref 8–23)
CO2: 26 mmol/L (ref 22–32)
Calcium: 9.1 mg/dL (ref 8.9–10.3)
Chloride: 102 mmol/L (ref 98–111)
Creatinine, Ser: 1.22 mg/dL — ABNORMAL HIGH (ref 0.44–1.00)
GFR, Estimated: 45 mL/min — ABNORMAL LOW (ref >60)
Glucose, Bld: 111 mg/dL — ABNORMAL HIGH (ref 70–99)
Potassium: 3.8 mmol/L (ref 3.5–5.1)
Sodium: 138 mmol/L (ref 135–145)

## 2024-02-28 LAB — GLUCOSE, CAPILLARY
Glucose-Capillary: 114 mg/dL — ABNORMAL HIGH (ref 70–99)
Glucose-Capillary: 274 mg/dL — ABNORMAL HIGH (ref 70–99)

## 2024-02-28 MED ORDER — APIXABAN 5 MG PO TABS
ORAL_TABLET | ORAL | 0 refills | Status: DC
  Filled 2024-02-28: qty 72, 30d supply, fill #0
  Filled 2024-02-28: qty 120, 54d supply, fill #0

## 2024-02-28 MED ORDER — GUAIFENESIN ER 600 MG PO TB12
600.0000 mg | ORAL_TABLET | Freq: Two times a day (BID) | ORAL | 0 refills | Status: DC
  Filled 2024-02-28: qty 14, 7d supply, fill #0

## 2024-02-28 MED ORDER — INSULIN GLARGINE-YFGN 100 UNIT/ML ~~LOC~~ SOLN
35.0000 [IU] | Freq: Every day | SUBCUTANEOUS | Status: DC
  Filled 2024-02-28: qty 0.35

## 2024-02-28 NOTE — TOC Transition Note (Addendum)
 Transition of Care [facility]) - Discharge Note   Patient Details  Name: [name] [name] MRN: [id number] Date of Birth: 1946-04-26  [facility] CM/SW Contact:  Heather DELENA Saltness, LCSW Phone Number: 02/28/2024, 12:37 PM   Clinical Narrative:    Pt recommended for Rollator at home. No PT/OT follow up. Rollator ordered through [employer]. Rollator to be delivered to bedside. No further TOC needs at this time.   Final next level of care: Home/Self Care Barriers to Discharge: Barriers Resolved   Patient Goals and CMS Choice Patient states their goals for this hospitalization and ongoing recovery are:: To return home          Discharge Placement  Home              Patient to be transferred to facility by: Family Name of family member notified: Patient Patient and family notified of of transfer: 02/28/24  Discharge Plan and Services Additional resources added to the After Visit Summary for  Follow Up                DME Arranged: Walker rolling with seat DME Agency: [employer] Date DME Agency Contacted: 02/28/24 Time DME Agency Contacted: 1236 Representative spoke with at DME Agency: [facility] Arranged: NA HH Agency: NA        Social Drivers of Health (SDOH) Interventions SDOH Screenings   Food Insecurity: No Food Insecurity (02/26/2024)  Housing: Low Risk  (02/26/2024)  Transportation Needs: No Transportation Needs (02/26/2024)  Utilities: Not At Risk (02/26/2024)  Depression (PHQ2-9): High Risk (07/11/2023)  Financial Resource Strain: Low Risk  (01/14/2022)  Physical Activity: Inactive (01/14/2022)  Social Connections: Moderately Integrated (02/26/2024)  Stress: No Stress Concern Present (01/14/2022)  Tobacco Use: Low Risk  (02/26/2024)     Readmission Risk Interventions     No data to display          Signed: Heather Saltness, MSW, LCSW Clinical Social Worker Inpatient Care Management 02/28/2024 12:39 PM

## 2024-02-28 NOTE — Discharge Summary (Signed)
 Physician Discharge Summary  [name] [name] FMW:[id number] DOB: 1946-05-06 DOA: 02/26/2024  PCP: Avelina Greig [name], MD  Admit date: 02/26/2024 Discharge date: 02/28/2024  Admitted From: Home Disposition:  Home  Discharge Condition:Stable CODE STATUS:FULL Diet recommendation: Heart Healthy Brief/Interim Summary:  Patient is a 78 year old female with history of renal cell carcinoma status post nephrectomy and now with solitary kidney, diabetes type 2 on insulin  pump, diastolic CHF, autoimmune hepatitis/cirrhosis, anxiety, GERD, hyperlipidemia who recently underwent CT scanning of the chest/abdomen/pelvis for lungs and was found to have PE in the right main, segmental right upper and middle lobe pulmonary artery with associated right heart strain, no evidence of pulmonary infarction.  Also noted to have mucous plugging of the lower lobes.  She was asked to come to the emergency department.  Reported worsening of cough for last 2 weeks, no shortness of breath has chronic bilateral lower EXTR edema and has been in bed most of the time with less activities for last 2 weeks but carries out ADLs by herself and independent.  On presentation, she was hemodynamically stable.  Lab work showed platelets of 126, glucose of 431, creatinine of 1.2.  Started on heparin  drip.  Case was discussed with pulmonary medicine and they advised no need for mechanical thrombectomy thrombolytics.  Echo showed stable heart function, optimal EF, no right ventricular dysfunction.  Anticoagulation changed to Eliquis .  Medically stable for discharge home today  Following problems were addressed during the hospitalization:   Acute PE: Incidentally found to have PE.  CT imaging showed main, right main, and segmental right upper and middle lobe pulmonary emboli.  No pulmonary infarction.  Transition to Eliquis .  Echo showed normal EF, no wall motion abnormality, normal right ventricular function. Bilateral lower extremity Doppler was positive  for DVT   Acute hypoxic respiratory failure: weaned to room air now.  Was having some cough issues since last few weeks at home.  Hypoxia secondary to PE and bilateral lower lobes mucous plugs.  Continue Mucinex    Renal cell carcinoma: Status post nephrectomy.  CT abdomen/chest/pelvis did not show any signs of recurrence or metastatic disease.  Showed stable pulmonary micronodules within the left lower lobe measuring up to 5 mm,stable hypodense pancreatic lesions.  Follow-up with Dr. Federico.   Diabetes type 2/hyperglycemia: Uses insulin  pump at home.  Hemoglobin A1c recently was 7.9.  Diabetic coordinator consulted.  As per the daughter, she has enough insulin  supplies that can be given subcutaneously before resuming her insulin  pump   Recurrent UTI: On chronic suppressive therapy with Macrobid    Autoimmune hepatitis/cirrhosis of liver: Has stable thrombocytopenia.  Also on chronic maintenance therapy with prednisone  10 mg daily.  CT imaging showed cirrhosis.  Has mildly elevated liver enzymes and ALP.  Follows with Dr. Rollin   CKD stage IIIa: Currently kidney function at baseline   GERD: Continue Protonix    Back pain/chronic debility: Patient has chronic back pain.  Lives alone.  Falls frequently.  Follows with orthopedic/pain management.  Will consult PT.    Discharge Diagnoses:  Principal Problem:   Acute pulmonary embolus (HCC) Active Problems:   Poorly controlled type 2 diabetes mellitus (HCC)   HYPERCHOLESTEROLEMIA   GERD   Acute bilateral low back pain without sciatica   Recurrent UTI   Autoimmune hepatitis (HCC)   Renal cell cancer (HCC)   CKD stage 3a, GFR 45-59 ml/min [facility])    Discharge Instructions  Discharge Instructions     Diet - low sodium heart healthy   Complete by:  As directed    Discharge instructions   Complete by: As directed    1)Please take your medications as instructed 2)Follow up with your PCP next week 3)Foll up with your oncologist   Increase  activity slowly   Complete by: As directed    No wound care   Complete by: As directed       Allergies as of 02/28/2024       Reactions   Codeine  Other (See Comments)   Unknown    Doxycycline  Hives   Epinephrine  Hcl Other (See Comments)   Had tremors when it was given.   Erythromycin Ethylsuccinate Other (See Comments)   Abdominal spasms   Eucalyptus Oil Other (See Comments)   Unknown    Iohexol  Hives   Pt needs premedicated-- hives on prev contrast study per md office   Lidocaine  Other (See Comments)   Tremors (lasted months)   Menthol Other (See Comments)   Unknown    Metformin  Diarrhea   Oxycodone  Other (See Comments)   felt like I was dying   Tape Rash        Medication List     STOP taking these medications    oxybutynin 10 MG 24 hr tablet Commonly known as: DITROPAN-XL       TAKE these medications    albuterol  108 (90 Base) MCG/ACT inhaler Commonly known as: ProAir  HFA Inhale 2 puffs into the lungs every 6 (six) hours as needed for wheezing or shortness of breath.   apixaban  5 MG Tabs tablet Commonly known as: ELIQUIS  Take 2 tablets (10 mg total) by mouth 2 (two) times daily for 6 days, THEN 1 tablet (5 mg total) 2 (two) times daily. Start taking on: February 28, 2024   atenolol  25 MG tablet Commonly known as: TENORMIN  TAKE 1 TABLET (25 MG TOTAL) BY MOUTH DAILY.   cetirizine 10 MG tablet Commonly known as: ZYRTEC Take 10 mg by mouth daily.   Coenzyme Q10 100 MG Tabs Take 100 mg by mouth daily.   CRANBERRY PO Take 1,000 mg by mouth in the morning and at bedtime.   D-MANNOSE PO Take 4,000 mg by mouth in the morning and at bedtime.   DULoxetine  60 MG capsule Commonly known as: Cymbalta  Take 1 capsule (60 mg total) by mouth daily. What changed: Another medication with the same name was removed. Continue taking this medication, and follow the directions you see here.   furosemide  40 MG tablet Commonly known as: LASIX  Take 40 mg by mouth  daily.   guaiFENesin  600 MG 12 hr tablet Commonly known as: Mucinex  Take 1 tablet (600 mg total) by mouth 2 (two) times daily for 7 days.   HumaLOG 100 UNIT/ML injection Generic drug: insulin  lispro Inject into the skin 3 (three) times daily before meals.   HYDROcodone -acetaminophen  5-325 MG tablet Commonly known as: NORCO/VICODIN Take 1 tablet by mouth in the morning and at bedtime.   hydrOXYzine  25 MG tablet Commonly known as: ATARAX  Take 25 mg by mouth 2 (two) times daily as needed for anxiety.   nitrofurantoin  (macrocrystal-monohydrate) 100 MG capsule Commonly known as: MACROBID  Take 100 mg by mouth 2 (two) times daily.   pantoprazole  40 MG tablet Commonly known as: PROTONIX  TAKE 1 TABLET BY MOUTH EVERY DAY   polyethylene glycol 17 g packet Commonly known as: MIRALAX  / GLYCOLAX  Take 17 g by mouth daily as needed for mild constipation.   predniSONE  10 MG tablet Commonly known as: DELTASONE  Take 10 mg by mouth  daily with breakfast.   promethazine  12.5 MG tablet Commonly known as: PHENERGAN  Take 12.5 mg by mouth 2 (two) times daily as needed for nausea.   traMADol  50 MG tablet Commonly known as: ULTRAM  Take 1 tablet (50 mg total) by mouth every 8 (eight) hours as needed for severe pain (pain score 7-10).   UNABLE TO FIND Take 1 tablet by mouth in the morning and at bedtime. Med Name: Korene NOVAK Vitamin        Follow-up Information     Avelina Greig [name], MD. Schedule an appointment as soon as possible for a visit in 1 week(s).   Specialty: Family Medicine Contact information: [address] [place] [address] [phone]                Allergies  Allergen Reactions   Codeine  Other (See Comments)    Unknown    Doxycycline  Hives   Epinephrine  Hcl Other (See Comments)    Had tremors when it was given.   Erythromycin Ethylsuccinate Other (See Comments)    Abdominal spasms   Eucalyptus Oil Other (See Comments)    Unknown    Iohexol  Hives     Pt needs premedicated-- hives on prev contrast study per md office    Lidocaine  Other (See Comments)    Tremors (lasted months)   Menthol Other (See Comments)    Unknown    Metformin  Diarrhea   Oxycodone  Other (See Comments)    felt like I was dying   Tape Rash    Consultations: None   Procedures/Studies: VAS US  LOWER EXTREMITY VENOUS (DVT) Result Date: 02/27/2024  Lower Venous DVT Study Patient Name:  [name] [name]  Date of Exam:   02/27/2024 Medical Rec #: [id number]      Accession #:    [id number] Date of Birth: Mar 18, 1946       Patient Gender: F Patient Age:   30 years Exam Location:  [facility] Procedure:      VAS US  LOWER EXTREMITY VENOUS (DVT) Referring Phys: RENATO APPLEBAUM --------------------------------------------------------------------------------  Indications: Pulmonary embolism.  Risk Factors: History of renal cell cancer status post nephrectomy. Comparison Study: Prior negative bilateral LEV done 03/26/17 Performing Technologist: Alberta Lis RVS  Examination Guidelines: A complete evaluation includes B-mode imaging, spectral Doppler, color Doppler, and power Doppler as needed of all accessible portions of each vessel. Bilateral testing is considered an integral part of a complete examination. Limited examinations for reoccurring indications may be performed as noted. The reflux portion of the exam is performed with the patient in reverse Trendelenburg.  +---------+---------------+---------+-----------+----------+-----------------+ RIGHT    CompressibilityPhasicitySpontaneityPropertiesThrombus Aging    +---------+---------------+---------+-----------+----------+-----------------+ CFV      Full           Yes      Yes                                    +---------+---------------+---------+-----------+----------+-----------------+ SFJ      Full                                                            +---------+---------------+---------+-----------+----------+-----------------+ FV Prox  Full                                                           +---------+---------------+---------+-----------+----------+-----------------+  FV Mid   Full                                                           +---------+---------------+---------+-----------+----------+-----------------+ FV DistalFull                                                           +---------+---------------+---------+-----------+----------+-----------------+ PFV      Full                                                           +---------+---------------+---------+-----------+----------+-----------------+ POP      Full           Yes      Yes                                    +---------+---------------+---------+-----------+----------+-----------------+ PTV      Partial                                      Acute mid portion +---------+---------------+---------+-----------+----------+-----------------+ PERO     Partial                                      Acute mid portion +---------+---------------+---------+-----------+----------+-----------------+   +----------+---------------+---------+-----------+----------+--------------+ LEFT      CompressibilityPhasicitySpontaneityPropertiesThrombus Aging +----------+---------------+---------+-----------+----------+--------------+ CFV       Partial        Yes      No                   Acute          +----------+---------------+---------+-----------+----------+--------------+ SFJ       Partial                                      Acute          +----------+---------------+---------+-----------+----------+--------------+ FV Prox   Full           Yes      Yes                                 +----------+---------------+---------+-----------+----------+--------------+ FV Mid    Full                                                         +----------+---------------+---------+-----------+----------+--------------+ FV Distal Full                                                        +----------+---------------+---------+-----------+----------+--------------+  PFV       Full           Yes      Yes                                 +----------+---------------+---------+-----------+----------+--------------+ POP       Partial        Yes      No                   Acute          +----------+---------------+---------+-----------+----------+--------------+ PTV       Full                                                        +----------+---------------+---------+-----------+----------+--------------+ PERO      Full                                                        +----------+---------------+---------+-----------+----------+--------------+ EIV                      Yes      Yes                  patent         +----------+---------------+---------+-----------+----------+--------------+ CIV Distal               Yes      Yes                  patent         +----------+---------------+---------+-----------+----------+--------------+     Summary: RIGHT: - Findings consistent with acute deep vein thrombosis involving the mid portion of the right posterior tibial veins, and right peroneal veins.  - No cystic structure found in the popliteal fossa.  LEFT: - Findings consistent with partially occlusive acute deep vein thrombosis involving the left common femoral vein, SF junction, and left popliteal vein.  - No cystic structure found in the popliteal fossa. - External iliac and distal common iliac are patent  *See table(s) above for measurements and observations. Electronically signed by Norman Serve on 02/27/2024 at 4:36:41 PM.    Final    ECHOCARDIOGRAM COMPLETE Result Date: 02/27/2024    ECHOCARDIOGRAM REPORT   Patient Name:   [facility] Date of Exam: 02/27/2024 Medical Rec #:  [id number]     Height:        63.0 in Accession #:    [id number]    Weight:       184.7 lb Date of Birth:  12-04-1945      BSA:          1.870 m Patient Age:    78 years      BP:           112/45 mmHg Patient Gender: F             HR:           65 bpm. Exam Location:  Inpatient Procedure: 2D Echo, Cardiac Doppler and Color Doppler (Both Spectral and Color  Flow Doppler were utilized during procedure). Indications:    I26.02 Pulmonary embolus  History:        Patient has prior history of Echocardiogram examinations, most                 recent 10/20/2023. Abnormal ECG, Arrythmias:Atrial Fibrillation,                 Signs/Symptoms:Chest Pain, Dizziness/Lightheadedness, Syncope                 and Altered Mental Status; Risk Factors:Dyslipidemia and                 Diabetes. Cancer. OSA. 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  Sonographer:    Ellouise Mose RDCS Referring Phys: [id number] [facility]  Sonographer Comments: Technically difficult study due to poor echo windows and patient is obese. Image acquisition challenging due to patient body habitus. Patient with AMS attempted to turn. Talking throughout exam. IMPRESSIONS  1. Poor acoustic windows limit study.  2. Left ventricular ejection fraction, by estimation, is 65 to 70%. The left ventricle has normal function. The left ventricle has no regional wall motion abnormalities. Left ventricular diastolic parameters are indeterminate.  3. Right ventricular systolic function is normal. The right ventricular size is normal. There is normal pulmonary artery systolic pressure. The estimated right ventricular systolic pressure is 33.4 mmHg.  4. Mild mitral valve regurgitation. Moderate mitral annular calcification.  5. The aortic valve has an indeterminant number of cusps. Aortic valve regurgitation is not visualized. Aortic valve sclerosis/calcification is present, without any evidence of aortic stenosis. FINDINGS  Left Ventricle: Left ventricular ejection fraction, by estimation, is 65 to 70%. The  left ventricle has normal function. The left ventricle has no regional wall motion abnormalities. The left ventricular internal cavity size was normal in size. There is  no left ventricular hypertrophy. Left ventricular diastolic parameters are indeterminate. Right Ventricle: The right ventricular size is normal. Right vetricular wall thickness was not assessed. Right ventricular systolic function is normal. There is normal pulmonary artery systolic pressure. The tricuspid regurgitant velocity is 2.52 m/s, and with an assumed right atrial pressure of 8 mmHg, the estimated right ventricular systolic pressure is 33.4 mmHg. Left Atrium: Left atrial size was normal in size. Right Atrium: Right atrial size was normal in size. Pericardium: There is no evidence of pericardial effusion. Mitral Valve: There is mild thickening of the mitral valve leaflet(s). Moderate mitral annular calcification. Mild mitral valve regurgitation. Tricuspid Valve: The tricuspid valve is normal in structure. Tricuspid valve regurgitation is trivial. Aortic Valve: The aortic valve has an indeterminant number of cusps. Aortic valve regurgitation is not visualized. Aortic valve sclerosis/calcification is present, without any evidence of aortic stenosis. Pulmonic Valve: The pulmonic valve was not well visualized. Pulmonic valve regurgitation is not visualized. No evidence of pulmonic stenosis. Aorta: The aortic root is normal in size and structure. IAS/Shunts: No atrial level shunt detected by color flow Doppler.  LEFT VENTRICLE PLAX 2D LVIDd:         4.50 cm     Diastology LVIDs:         3.10 cm     LV e' medial:    5.44 cm/s LV PW:         1.10 cm     LV E/e' medial:  13.8 LV IVS:        1.00 cm     LV e' lateral:   7.94 cm/s LVOT diam:     2.10 cm  LV E/e' lateral: 9.5 LV SV:         68 LV SV Index:   36 LVOT Area:     3.46 cm  LV Volumes (MOD) LV vol d, MOD A2C: 66.1 ml LV vol d, MOD A4C: 52.1 ml LV vol s, MOD A2C: 12.5 ml LV vol s, MOD A4C:  17.3 ml LV SV MOD A2C:     53.6 ml LV SV MOD A4C:     52.1 ml LV SV MOD BP:      43.6 ml RIGHT VENTRICLE             IVC RV S prime:     13.40 cm/s  IVC diam: 1.60 cm TAPSE (M-mode): 2.0 cm LEFT ATRIUM             Index        RIGHT ATRIUM           Index LA diam:        4.30 cm 2.30 cm/m   RA Area:     15.90 cm LA Vol (A2C):   18.9 ml 10.11 ml/m  RA Volume:   43.30 ml  23.16 ml/m LA Vol (A4C):   16.4 ml 8.77 ml/m LA Biplane Vol: 18.7 ml 10.00 ml/m  AORTIC VALVE LVOT Vmax:   92.00 cm/s LVOT Vmean:  60.200 cm/s LVOT VTI:    0.196 m  AORTA Ao Asc diam: 3.20 cm MITRAL VALVE               TRICUSPID VALVE MV Area (PHT): 3.08 cm    TR Peak grad:   25.4 mmHg MV Decel Time: 246 msec    TR Vmax:        252.00 cm/s MV E velocity: 75.10 cm/s MV A velocity: 98.80 cm/s  SHUNTS MV E/A ratio:  0.76        Systemic VTI:  0.20 m                            Systemic Diam: 2.10 cm Vina Gull MD Electronically signed by Vina Gull MD Signature Date/Time: 02/27/2024/11:45:24 AM    Final    CT CHEST ABDOMEN PELVIS W CONTRAST Result Date: 02/26/2024 CLINICAL DATA:  Kidney cancer, post nephrectomy, monitor surveillance visit for history of RCC s/p nephrectomy. lung nodules need to monitor. EXAM: CT CHEST, ABDOMEN, AND PELVIS WITH CONTRAST TECHNIQUE: Multidetector CT imaging of the chest, abdomen and pelvis was performed following the standard protocol during bolus administration of intravenous contrast. RADIATION DOSE REDUCTION: This exam was performed according to the departmental dose-optimization program which includes automated exposure control, adjustment of the mA and/or kV according to patient size and/or use of iterative reconstruction technique. CONTRAST:  OMNIPAQUE  IOHEXOL  300 MG/ML  SOLN COMPARISON:  CT chest abdomen pelvis 10/18/2023 FINDINGS: CT CHEST FINDINGS Cardiovascular: There is a main pulmonary artery filling defect extending to the right main pulmonary artery as well as right upper and right middle  segmental pulmonary arteries. Normal heart size. No significant pericardial effusion. The thoracic aorta is normal in caliber. No atherosclerotic plaque of the thoracic aorta. No coronary artery calcifications. Mitral annular calcification. Mediastinum/Nodes: No enlarged mediastinal, hilar, or axillary lymph nodes. Thyroid  gland, trachea, and esophagus demonstrate no significant findings. Lungs/Pleura: Stable right lower lobe paravertebral mucous plugging (7:1 20-127). Similar finding within the peripheral left lower lobe (7:104) with associated stable cluster of calcified pulmonary micronodules. Stable pulmonary punctate micronodule within the  left lower lobe (7:94). Stable 5 mm left lower lobe subpleural nodule (7:68). Subpleural triangular lymph node along the left major fissure likely a intrapulmonary lymph node-no further follow-up indicated (7:90). No pulmonary mass. No pleural effusion. No pneumothorax. Musculoskeletal: No chest wall abnormality. No suspicious lytic or blastic osseous lesions. No acute displaced fracture. Multilevel degenerative changes of the spine. CT ABDOMEN PELVIS FINDINGS Hepatobiliary: Persistent mild nodular hepatic contour. No focal liver abnormality. Status post cholecystectomy. No biliary dilatation. Pancreas: Stable hypodense pancreatic lesions. Redemonstration of a 1.4 cm hypodense fluid density lesion along the proximal pancreas, a 1.4 cm fluid dense lesion along the pancreatic tail, a 0.6 cm distal pancreatic body hypodensity (2:61). Normal pancreatic contour. No surrounding inflammatory changes. No main pancreatic ductal dilatation. Spleen: Normal in size without focal abnormality. Adrenals/Urinary Tract: No adrenal nodule bilaterally. Status post left nephrectomy. The right kidney enhances homogeneously. No right renal mass. No hydronephrosis. No hydroureter.  No nephroureterolithiasis. The urinary bladder is unremarkable. On delayed imaging, there is no urothelial wall  thickening and there are no filling defects in the opacified portions of the right collecting system. Stomach/Bowel: Stomach is within normal limits. No evidence of bowel wall thickening or dilatation. Colonic diverticulosis. Status post appendectomy. Vascular/Lymphatic: No abdominal aorta or iliac aneurysm. Severe atherosclerotic plaque of the aorta and its branches. No abdominal, pelvic, or inguinal lymphadenopathy. Reproductive: Status post hysterectomy. No adnexal masses. Other: No intraperitoneal free fluid. No intraperitoneal free gas. No organized fluid collection. Musculoskeletal: No abdominal wall hernia or abnormality. No suspicious lytic or blastic osseous lesions. No acute displaced fracture. Multilevel severe degenerative changes of the spine. IMPRESSION: 1. Main, right main, and segmental right upper and middle lobe pulmonary emboli. Associated right heart strain. No pulmonary infarction. 2. No findings to suggest recurrent or metastatic malignancy in a patient with prior RCC status post left nephrectomy. Other imaging findings of potential clinical significance: 1. Stable bilateral lower lobe mucous plugging. Associated cluster of stable calcified pulmonary micronodules within the left lower lobe. 2. Couple stable pulmonary micronodules within the left lower lobe measuring up to 5 mm. 3. Stable hypodense pancreatic lesions. 4. Cirrhosis. No focal liver lesions identified. Please note that liver protocol enhanced MR and CT are the most sensitive tests for the screening detection of hepatocellular carcinoma in the high risk setting of cirrhosis. 5. Colonic diverticulosis with no acute diverticulitis. 6.  Aortic Atherosclerosis (ICD10-I70.0). These results were called by telephone at the time of interpretation on 02/26/2024 at 7:59 pm to provider Christina Lipscomb traige nurse who verbally acknowledged these results. Electronically Signed   By: Morgane  Naveau M.D.   On: 02/26/2024 20:13       Subjective: Patient seen and examined at bedside today.  Hemodynamically stable.  On room air.  Denied any shortness of breath or chest pain.  No cough.  She feels ready to go home.  I called her daughter and discussed about discharge planning.  Discharge Exam: Vitals:   02/28/24 0800 02/28/24 1139  BP: 108/69   Pulse: 69   Resp: 15   Temp:  97.7 F (36.5 C)  SpO2: 100%    Vitals:   02/28/24 0715 02/28/24 0758 02/28/24 0800 02/28/24 1139  BP:   108/69   Pulse: 64  69   Resp: 14  15   Temp:    97.7 F (36.5 C)  TempSrc:    Oral  SpO2: 97% 94% 100%   Weight:      Height:  General: Pt is alert, awake, not in acute distress Cardiovascular: RRR, S1/S2 +, no rubs, no gallops Respiratory: CTA bilaterally, no wheezing, no rhonchi Abdominal: Soft, NT, ND, bowel sounds + Extremities: no edema, no cyanosis    The results of significant diagnostics from this hospitalization (including imaging, microbiology, ancillary and laboratory) are listed below for reference.     Microbiology: Recent Results (from the past 240 hours)  MRSA Next Gen by PCR, Nasal     Status: None   Collection Time: 02/26/24 10:55 PM   Specimen: Nasal Mucosa; Nasal Swab  Result Value Ref Range Status   MRSA by PCR Next Gen NOT DETECTED NOT DETECTED Final    Comment: (NOTE) The GeneXpert MRSA Assay (FDA approved for NASAL specimens only), is one component of a comprehensive MRSA colonization surveillance program. It is not intended to diagnose MRSA infection nor to guide or monitor treatment for MRSA infections. Test performance is not FDA approved in patients less than 38 years old. Performed at [facility], [address] [place], [place] [address]      Labs: BNP (last 3 results) Recent Labs    02/26/24 2002  BNP 93.3   Basic Metabolic Panel: Recent Labs  Lab 02/26/24 1916 02/26/24 2002 02/28/24 0232  NA  --  132* 138  K  --  4.6 3.8  CL  --  99 102   CO2  --  20* 26  GLUCOSE  --  431* 111*  BUN  --  17 29*  CREATININE 1.20* 1.21* 1.22*  CALCIUM  --  9.3 9.1   Liver Function Tests: Recent Labs  Lab 02/26/24 2002  AST 59*  ALT 41  ALKPHOS 250*  BILITOT 2.0*  PROT 6.7  ALBUMIN 3.3*   No results for input(s): LIPASE, AMYLASE in the last 168 hours. No results for input(s): AMMONIA in the last 168 hours. CBC: Recent Labs  Lab 02/26/24 2002 02/27/24 0456  WBC 3.0* 3.9*  HGB 13.9 12.6  HCT 43.4 38.1  MCV 91.4 91.8  PLT 126* 121*   Cardiac Enzymes: No results for input(s): CKTOTAL, CKMB, CKMBINDEX, TROPONINI in the last 168 hours. BNP: Invalid input(s): POCBNP CBG: Recent Labs  Lab 02/27/24 0803 02/27/24 1223 02/27/24 1648 02/27/24 2151 02/28/24 0747  GLUCAP 338* 352* 357* 345* 114*   D-Dimer No results for input(s): DDIMER in the last 72 hours. Hgb A1c No results for input(s): HGBA1C in the last 72 hours. Lipid Profile No results for input(s): CHOL, HDL, LDLCALC, TRIG, CHOLHDL, LDLDIRECT in the last 72 hours. Thyroid  function studies No results for input(s): TSH, T4TOTAL, T3FREE, THYROIDAB in the last 72 hours.  Invalid input(s): FREET3 Anemia work up No results for input(s): VITAMINB12, FOLATE, FERRITIN, TIBC, IRON, RETICCTPCT in the last 72 hours. Urinalysis    Component Value Date/Time   COLORURINE YELLOW 02/26/2024 2228   APPEARANCEUR CLEAR 02/26/2024 2228   LABSPEC >1.046 (H) 02/26/2024 2228   PHURINE 5.0 02/26/2024 2228   GLUCOSEU >=500 (A) 02/26/2024 2228   HGBUR SMALL (A) 02/26/2024 2228   HGBUR moderate 03/28/2010 0931   BILIRUBINUR NEGATIVE 02/26/2024 2228   BILIRUBINUR Negative 09/21/2021 1614   KETONESUR 5 (A) 02/26/2024 2228   PROTEINUR NEGATIVE 02/26/2024 2228   UROBILINOGEN 0.2 09/21/2021 1614   UROBILINOGEN 0.2 03/28/2010 0931   NITRITE NEGATIVE 02/26/2024 2228   LEUKOCYTESUR NEGATIVE 02/26/2024 2228   Sepsis Labs Recent  Labs  Lab 02/26/24 2002 02/27/24 0456  WBC 3.0* 3.9*   Microbiology Recent Results (  from the past 240 hours)  MRSA Next Gen by PCR, Nasal     Status: None   Collection Time: 02/26/24 10:55 PM   Specimen: Nasal Mucosa; Nasal Swab  Result Value Ref Range Status   MRSA by PCR Next Gen NOT DETECTED NOT DETECTED Final    Comment: (NOTE) The GeneXpert MRSA Assay (FDA approved for NASAL specimens only), is one component of a comprehensive MRSA colonization surveillance program. It is not intended to diagnose MRSA infection nor to guide or monitor treatment for MRSA infections. Test performance is not FDA approved in patients less than 89 years old. Performed at [facility], [address] [place], [place] [address]     Please note: You were cared for by a hospitalist during your hospital stay. Once you are discharged, your primary care physician will handle any further medical issues. Please note that NO REFILLS for any discharge medications will be authorized once you are discharged, as it is imperative that you return to your primary care physician (or establish a relationship with a primary care physician if you do not have one) for your post hospital discharge needs so that they can reassess your need for medications and monitor your lab values.    Time coordinating discharge: 40 minutes  SIGNED:   Ivonne Mustache, MD  [facility] 02/28/2024, 11:47 AM Pager [phone]  If 7PM-7AM, please contact night-coverage [email or web address] Password [email or web address]

## 2024-02-28 NOTE — Evaluation (Signed)
 Physical Therapy Evaluation Patient Details Name: [name] [name] MRN: [id number] DOB: 08/07/1945 Today's Date: 02/28/2024  History of Present Illness  Pt admitted from home with acute PE and recurrent UTI.  Pt with hx of CHF, DM, renal cell CA s/p nephrectomy; asthma, autoimmune hepatits and cirrhosis.  Clinical Impression  Pt admitted as above and presenting with functional mobility limitations 2* generalized weakness, mild ambulatory balance deficits, and decreased activity tolerance.  This date, pt up to ambulate in hall with sup/CGA but notably reliant on RW.  Pt reports she currently uses Rollator at home but is in poor condition.  Pt would benefit from use of new rollator at home for safety and stability.        If plan is discharge home, recommend the following: Assist for transportation;Help with stairs or ramp for entrance;Assistance with cooking/housework   Can travel by private Data processing manager (4 wheels)  Recommendations for Other Services       Functional Status Assessment Patient has had a recent decline in their functional status and demonstrates the ability to make significant improvements in function in a reasonable and predictable amount of time.     Precautions / Restrictions Precautions Precautions: Fall Restrictions Weight Bearing Restrictions Per Provider Order: No      Mobility  Bed Mobility Overal bed mobility: Modified Independent             General bed mobility comments: unassisted to bedside sitting    Transfers Overall transfer level: Needs assistance Equipment used: Rolling walker (2 wheels) Transfers: Sit to/from Stand Sit to Stand: Supervision                Ambulation/Gait Ambulation/Gait assistance: Contact guard assist, Supervision Gait Distance (Feet): 350 Feet Assistive device: Rolling walker (2 wheels) Gait Pattern/deviations: Step-through pattern, Decreased step length - right, Decreased  step length - left, Shuffle, Trunk flexed       General Gait Details: mild instability but no overt LOB; cues for posture and position from RW - pt reports uses ROllator at home  Stairs            Wheelchair Mobility     Tilt Bed    Modified Rankin (Stroke Patients Only)       Balance Overall balance assessment: Needs assistance Sitting-balance support: No upper extremity supported, Feet supported Sitting balance-Leahy Scale: Good     Standing balance support: No upper extremity supported Standing balance-Leahy Scale: Fair                               Pertinent Vitals/Pain Pain Assessment Pain Assessment: No/denies pain    Home Living Family/patient expects to be discharged to:: Private residence Living Arrangements: Alone Available Help at Discharge: Available PRN/intermittently;Family Type of Home: House Home Access: Ramped entrance       Home Layout: One level Home Equipment: Grab bars - tub/shower;Grab bars - toilet;Rollator (4 wheels);Wheelchair - manual;Cane - single point (Rollator is in very poor condition) Additional Comments: PCA 1x/week for IADLs and bathing    Prior Function Prior Level of Function : Needs assist             Mobility Comments: independent w/ rollator ADLs Comments: patient reported living at home alone with independence in basic ADLs     Extremity/Trunk Assessment   Upper Extremity Assessment Upper Extremity Assessment: Generalized weakness    Lower Extremity Assessment  Lower Extremity Assessment: Generalized weakness       Communication   Communication Communication: No apparent difficulties    Cognition Arousal: Alert Behavior During Therapy: WFL for tasks assessed/performed   PT - Cognitive impairments: No apparent impairments                         Following commands: Intact       Cueing Cueing Techniques: Verbal cues     General Comments      Exercises      Assessment/Plan    PT Assessment Patient needs continued PT services  PT Problem List Decreased strength;Decreased activity tolerance;Decreased balance;Decreased mobility;Decreased knowledge of use of DME       PT Treatment Interventions DME instruction;Gait training;Functional mobility training;Therapeutic activities;Therapeutic exercise;Patient/family education;Balance training    PT Goals (Current goals can be found in the Care Plan section)  Acute Rehab PT Goals Patient Stated Goal: REgain IND PT Goal Formulation: With patient Time For Goal Achievement: 03/13/24 Potential to Achieve Goals: Good    Frequency Min 3X/week     Co-evaluation               AM-PAC PT 6 Clicks Mobility  Outcome Measure Help needed turning from your back to your side while in a flat bed without using bedrails?: None Help needed moving from lying on your back to sitting on the side of a flat bed without using bedrails?: None Help needed moving to and from a bed to a chair (including a wheelchair)?: None Help needed standing up from a chair using your arms (e.g., wheelchair or bedside chair)?: A Little Help needed to walk in hospital room?: A Little Help needed climbing 3-5 steps with a railing? : A Little 6 Click Score: 21    End of Session Equipment Utilized During Treatment: Gait belt Activity Tolerance: Patient tolerated treatment well Patient left: in chair;with call bell/phone within reach;with chair alarm set Nurse Communication: Mobility status PT Visit Diagnosis: Difficulty in walking, not elsewhere classified (R26.2)    Time: 8874-8844 PT Time Calculation (min) (ACUTE ONLY): 30 min   Charges:   PT Evaluation $PT Eval Low Complexity: 1 Low PT Treatments $Gait Training: 8-22 mins PT General Charges $$ ACUTE PT VISIT: 1 Visit         [facility] PT Acute Rehabilitation Services Office [phone]   Willie Plain 02/28/2024, 12:10 PM

## 2024-02-28 NOTE — Plan of Care (Signed)
  Problem: Coping: Goal: Ability to adjust to condition or change in health will improve Outcome: Progressing   Problem: Fluid Volume: Goal: Ability to maintain a balanced intake and output will improve Outcome: Progressing   Problem: Health Behavior/Discharge Planning: Goal: Ability to identify and utilize available resources and services will improve Outcome: Progressing   Problem: Nutritional: Goal: Maintenance of adequate nutrition will improve Outcome: Progressing   Problem: Skin Integrity: Goal: Risk for impaired skin integrity will decrease Outcome: Progressing   Problem: Tissue Perfusion: Goal: Adequacy of tissue perfusion will improve Outcome: Progressing   Problem: Education: Goal: Knowledge of General Education information will improve Description: Including pain rating scale, medication(s)/side effects and non-pharmacologic comfort measures Outcome: Progressing   Problem: Clinical Measurements: Goal: Cardiovascular complication will be avoided Outcome: Progressing

## 2024-02-28 NOTE — Progress Notes (Signed)
 TOC meds in a secure bag delivered to patient in room  by this RN

## 2024-02-29 NOTE — Progress Notes (Signed)
 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 Federico  08/05/2022: CT Chest WO showed numerous bilateral pulmonary nodules concerning for metastatic disease without interval change (from 06/17/2022 CT scan) 08/14/2022: left kidney resetion and appendectomy. Pathology confirms Clear Cell RCC  Interval History:  [name] [name] 78 y.o. female with medical history significant clear-cell carcinoma of the kidney status post resection who presents for a follow up visit.  In the interim since the last visit she had a repeat CT scan which showed a decrease in the size of her pulmonary nodules.   On exam today Ms. Hagerman reports she was surprised that she was diagnosed with a pulmonary embolism.  She had been having some pain in her legs but was not having any chest pain or shortness of breath.  She reports that she is tolerating Eliquis  therapy so far so good with no bleeding, bruising, or dark stools.  She reports that she does have a little bit of gum bleeding.  She reports that she is not having any blood in the urine or stool.  She reports that she otherwise has been her baseline level of health.  She is having pain in her back which was post to be treated with a procedure but was delayed due to the diagnosis of a pulmonary embolism.  She reports she has been sleepy because she is not planned to sleep well due to the pain.  Her energy is about a 5 out of 10 today.  She notes that she is not having any issues with fevers, chills, sweats, nausea, vomiting or diarrhea.  Full 10 point ROS is otherwise negative.  MEDICAL HISTORY:  Past Medical History:  Diagnosis Date    Allergic rhinitis, cause unspecified    Anxiety    Arthritis    hands, feet and back   Asthma    Benign neoplasm of colon    Bursitis    hips   CHF (congestive heart failure) (HCC)    Cirrhosis (HCC)    auto immune   Complication of anesthesia     i WAKE UP WITH TREMORS  after shoulder surgery tremors for 10 months after surgery surgery- 2009- surgical center in [place]   Cyst and pseudocyst of pancreas    Depressive disorder, not elsewhere classified    Diabetes mellitus without complication (HCC)    Diverticulitis    Diverticulosis of colon (without mention of hemorrhage)    Dyspnea    Dysrhythmia    arrhythmia   Esophageal reflux    Essential and other specified forms of tremor    History of GI bleed    History of kidney stones    Irritable bowel syndrome    Obesity, unspecified    Other specified cardiac dysrhythmias(427.89)    Periapical abscess without sinus    Polyneuropathy in diabetes(357.2)    PONV (postoperative nausea and vomiting)    Pure hypercholesterolemia    Renal cell carcinoma (HCC)    Sleep apnea    CPAP- non compliant   Type II or unspecified type diabetes mellitus with neurological manifestations, not stated as uncontrolled(250.60)     SURGICAL HISTORY: Past Surgical History:  Procedure Laterality Date   ABDOMINAL HYSTERECTOMY  CALCANEAL OSTEOTOMY Right 07/27/2015   Procedure: RIGHT CALCANEAL OSTEOTOMY ;  Surgeon: Norleen Armor, MD;  Location: [facility];  Service: Orthopedics;  Laterality: Right;   CHOLECYSTECTOMY     15-20 years ago   COLONOSCOPY WITH PROPOFOL  N/A 08/17/2019   Procedure: COLONOSCOPY WITH PROPOFOL ;  Surgeon: Rollin Dover, MD;  Location: [facility];  Service: Endoscopy;  Laterality: N/A;   COLONOSCOPY WITH PROPOFOL  N/A 06/21/2022   Procedure: COLONOSCOPY WITH PROPOFOL ;  Surgeon: Rollin Dover, MD;  Location: [facility];  Service: Gastroenterology;  Laterality: N/A;   ESOPHAGOGASTRODUODENOSCOPY (EGD) WITH  PROPOFOL  N/A 08/17/2019   Procedure: ESOPHAGOGASTRODUODENOSCOPY (EGD) WITH PROPOFOL ;  Surgeon: Rollin Dover, MD;  Location: [facility] END[facility]COPY;  Service: Endoscopy;  Laterality: N/A;   GASTROCNEMIUS RECESSION Right 07/27/2015   Procedure: RIGHT GASTROC RECESSION;  Surgeon: Norleen Armor, MD;  Location: [facility];  Service: Orthopedics;  Laterality: Right;   KIDNEY STONE SURGERY  1980's   removal   LITHOTRIPSY     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   PARTIAL HYSTERECTOMY  1990's   POLYPECTOMY  08/17/2019   Procedure: POLYPECTOMY;  Surgeon: Rollin Dover, MD;  Location: [facility] END[facility]COPY;  Service: Endoscopy;;   POLYPECTOMY  06/21/2022   Procedure: POLYPECTOMY;  Surgeon: Rollin Dover, MD;  Location: [facility];  Service: Gastroenterology;;   ROBOT ASSISTED LAPAROSCOPIC NEPHRECTOMY Left 08/14/2022   Procedure: XI ROBOTIC ASSISTED LAPAROSCOPIC NEPHRECTOMY;  Surgeon: Devere Lonni Righter, MD;  Location: [facility];  Service: Urology;  Laterality: Left;   ROTATOR CUFF REPAIR  2005   XI ROBOTIC LAPAROSCOPIC ASSISTED APPENDECTOMY N/A 08/14/2022   Procedure: XI ROBOTIC LAPAROSCOPIC ASSISTED APPENDECTOMY;  Surgeon: Debby Hila, MD;  Location: [facility];  Service: General;  Laterality: N/A;    SOCIAL HISTORY: Social History   Socioeconomic History   Marital status: Widowed    Spouse name: Not on file   Number of children: 5   Years of education: Not on file   Highest education level: Not on file  Occupational History    Employer: RETIRED  Tobacco Use   Smoking status: Never   Smokeless tobacco: Never  Vaping Use   Vaping status: Never Used  Substance and Sexual Activity   Alcohol use: No    Alcohol/week: 0.0 standard drinks of alcohol   Drug use: No   Sexual activity: Not on file  Other Topics Concern   Not on file  Social History Narrative   Husband treated for testicular cancer, 2 cups coffee a day, no exercise   Social Drivers of Health   Financial Resource Strain: Low Risk  (01/14/2022)    Overall Financial Resource Strain (CARDIA)    Difficulty of Paying Living Expenses: Not very hard  Food Insecurity: No Food Insecurity (02/26/2024)   Hunger Vital Sign    Worried About Running Out of Food in the Last Year: Never true    Ran Out of Food in the Last Year: Never true  Transportation Needs: No Transportation Needs (02/26/2024)   PRAPARE - Administrator, Civil Service (Medical): No    Lack of Transportation (Non-Medical): No  Physical Activity: Inactive (01/14/2022)   Exercise Vital Sign    Days of Exercise per Week: 0 days    Minutes of Exercise per Session: 0 min  Stress: No Stress Concern Present (01/14/2022)   [employer] of Occupational Health - Occupational Stress Questionnaire    Feeling of Stress : Only a little  Social Connections: Moderately Integrated (02/26/2024)   Social Connection and Isolation Panel  Frequency of Communication with Friends and Family: Three times a week    Frequency of Social Gatherings with Friends and Family: Three times a week    Attends Religious Services: More than 4 times per year    Active Member of Clubs or Organizations: Yes    Attends Banker Meetings: More than 4 times per year    Marital Status: Widowed  Intimate Partner Violence: Not At Risk (02/26/2024)   Humiliation, Afraid, Rape, and Kick questionnaire    Fear of Current or Ex-Partner: No    Emotionally Abused: No    Physically Abused: No    Sexually Abused: No    FAMILY HISTORY: Family History  Problem Relation Age of Onset   Clotting disorder Mother    Cirrhosis Father    Diabetes Other        2 aunts   Cancer Other        grandmother   Allergies Other        whole family   Asthma Other        whole family    ALLERGIES:  is allergic to codeine , doxycycline , epinephrine  hcl, erythromycin ethylsuccinate, eucalyptus oil, iohexol , lidocaine , menthol, metformin , oxycodone , and tape.  MEDICATIONS:  Current Outpatient Medications   Medication Sig Dispense Refill   albuterol  (PROAIR  HFA) 108 (90 Base) MCG/ACT inhaler Inhale 2 puffs into the lungs every 6 (six) hours as needed for wheezing or shortness of breath. 8 g 2   apixaban  (ELIQUIS ) 5 MG TABS tablet Take 2 tablets (10 mg total) by mouth 2 (two) times daily for 6 days, THEN 1 tablet (5 mg total) 2 (two) times daily. 120 tablet 0   atenolol  (TENORMIN ) 25 MG tablet TAKE 1 TABLET (25 MG TOTAL) BY MOUTH DAILY. 90 tablet 3   cetirizine (ZYRTEC) 10 MG tablet Take 10 mg by mouth daily.     Coenzyme Q10 100 MG TABS Take 100 mg by mouth daily.     CRANBERRY PO Take 1,000 mg by mouth in the morning and at bedtime.     D-MANNOSE PO Take 4,000 mg by mouth in the morning and at bedtime.     DULoxetine  (CYMBALTA ) 60 MG capsule Take 1 capsule (60 mg total) by mouth daily. 90 capsule 3   furosemide  (LASIX ) 40 MG tablet Take 40 mg by mouth daily.     guaiFENesin  (MUCINEX ) 600 MG 12 hr tablet Take 1 tablet (600 mg total) by mouth 2 (two) times daily for 7 days. 14 tablet 0   HYDROcodone -acetaminophen  (NORCO/VICODIN) 5-325 MG tablet Take 1 tablet by mouth in the morning and at bedtime.     hydrOXYzine  (ATARAX ) 25 MG tablet Take 25 mg by mouth 2 (two) times daily as needed for anxiety.     insulin  lispro (HUMALOG) 100 UNIT/ML injection Inject into the skin 3 (three) times daily before meals.     nitrofurantoin , macrocrystal-monohydrate, (MACROBID ) 100 MG capsule Take 100 mg by mouth 2 (two) times daily.     pantoprazole  (PROTONIX ) 40 MG tablet TAKE 1 TABLET BY MOUTH EVERY DAY 30 tablet 0   polyethylene glycol (MIRALAX  / GLYCOLAX ) 17 g packet Take 17 g by mouth daily as needed for mild constipation. 14 each 0   predniSONE  (DELTASONE ) 10 MG tablet Take 10 mg by mouth daily with breakfast.     promethazine  (PHENERGAN ) 12.5 MG tablet Take 12.5 mg by mouth 2 (two) times daily as needed for nausea.     traMADol  (ULTRAM ) 50 MG tablet  Take 1 tablet (50 mg total) by mouth every 8 (eight) hours as  needed for severe pain (pain score 7-10). 20 tablet 0   UNABLE TO FIND Take 1 tablet by mouth in the morning and at bedtime. Med Name: Nervive B Vitamin     No current facility-administered medications for this visit.    REVIEW OF SYSTEMS:   Constitutional: ( - ) fevers, ( - )  chills , ( - ) night sweats Eyes: ( - ) blurriness of vision, ( - ) double vision, ( - ) watery eyes Ears, nose, mouth, throat, and face: ( - ) mucositis, ( - ) sore throat Respiratory: ( - ) cough, ( - ) dyspnea, ( - ) wheezes Cardiovascular: ( - ) palpitation, ( - ) chest discomfort, ( - ) lower extremity swelling Gastrointestinal:  ( - ) nausea, ( - ) heartburn, ( - ) change in bowel habits Skin: ( - ) abnormal skin rashes Lymphatics: ( - ) new lymphadenopathy, ( - ) easy bruising Neurological: ( - ) numbness, ( - ) tingling, ( - ) new weaknesses Behavioral/Psych: ( - ) mood change, ( - ) new changes  All other systems were reviewed with the patient and are negative.  PHYSICAL EXAMINATION:  Vitals:   03/01/24 1514  BP: 117/80  Pulse: 60  Resp: 17  Temp: 97.8 F (36.6 C)  SpO2: 95%     Filed Weights   03/01/24 1514  Weight: 183 lb (83 kg)      GENERAL: Well-appearing elderly Caucasian female, alert, no distress and comfortable SKIN: skin color, texture, turgor are normal, no rashes or significant lesions EYES: conjunctiva are pink and non-injected, sclera clear LUNGS: clear to auscultation and percussion with normal breathing effort HEART: regular rate & rhythm and no murmurs and no lower extremity edema Musculoskeletal: no cyanosis of digits and no clubbing  PSYCH: alert & oriented x 3, fluent speech NEURO: no focal motor/sensory deficits  LABORATORY DATA:  I have reviewed the data as listed    Latest Ref Rng & Units 03/01/2024    2:58 PM 02/27/2024    4:56 AM 02/26/2024    8:02 PM  CBC  WBC 4.0 - 10.5 K/uL 10.8  3.9  3.0   Hemoglobin 12.0 - 15.0 g/dL 84.9  87.3  86.0   Hematocrit 36.0 -  46.0 % 44.6  38.1  43.4   Platelets 150 - 400 K/uL 201  121  126        Latest Ref Rng & Units 03/01/2024    2:58 PM 02/28/2024    2:32 AM 02/26/2024    8:02 PM  CMP  Glucose 70 - 99 mg/dL 49  888  568   BUN 8 - 23 mg/dL 28  29  17    Creatinine 0.44 - 1.00 mg/dL 8.69  8.77  8.78   Sodium 135 - 145 mmol/L 142  138  132   Potassium 3.5 - 5.1 mmol/L 3.4  3.8  4.6   Chloride 98 - 111 mmol/L 103  102  99   CO2 22 - 32 mmol/L 32  26  20   Calcium 8.9 - 10.3 mg/dL 9.2  9.1  9.3   Total Protein 6.5 - 8.1 g/dL 6.5   6.7   Total Bilirubin 0.0 - 1.2 mg/dL 1.1   2.0   Alkaline Phos 38 - 126 U/L 205   250   AST 15 - 41 U/L 42   59   ALT  0 - 44 U/L 34   41    RADIOGRAPHIC STUDIES: I have personally reviewed the radiological images as listed and agreed with the findings in the report: Lung nodules, most prominently in the left lower lung. VAS US  LOWER EXTREMITY VENOUS (DVT) Result Date: 02/27/2024  Lower Venous DVT Study Patient Name:  GEORGIANA SPILLANE  Date of Exam:   02/27/2024 Medical Rec #: [id number]      Accession #:    [id number] Date of Birth: Jan 25, 1946       Patient Gender: F Patient Age:   78 years Exam Location:  [facility] Procedure:      VAS US  LOWER EXTREMITY VENOUS (DVT) Referring Phys: RENATO APPLEBAUM --------------------------------------------------------------------------------  Indications: Pulmonary embolism.  Risk Factors: History of renal cell cancer status post nephrectomy. Comparison Study: Prior negative bilateral LEV done 03/26/17 Performing Technologist: Alberta Lis RVS  Examination Guidelines: A complete evaluation includes B-mode imaging, spectral Doppler, color Doppler, and power Doppler as needed of all accessible portions of each vessel. Bilateral testing is considered an integral part of a complete examination. Limited examinations for reoccurring indications may be performed as noted. The reflux portion of the exam is performed with the patient in reverse Trendelenburg.   +---------+---------------+---------+-----------+----------+-----------------+ RIGHT    CompressibilityPhasicitySpontaneityPropertiesThrombus Aging    +---------+---------------+---------+-----------+----------+-----------------+ CFV      Full           Yes      Yes                                    +---------+---------------+---------+-----------+----------+-----------------+ SFJ      Full                                                           +---------+---------------+---------+-----------+----------+-----------------+ FV Prox  Full                                                           +---------+---------------+---------+-----------+----------+-----------------+ FV Mid   Full                                                           +---------+---------------+---------+-----------+----------+-----------------+ FV DistalFull                                                           +---------+---------------+---------+-----------+----------+-----------------+ PFV      Full                                                           +---------+---------------+---------+-----------+----------+-----------------+  POP      Full           Yes      Yes                                    +---------+---------------+---------+-----------+----------+-----------------+ PTV      Partial                                      Acute mid portion +---------+---------------+---------+-----------+----------+-----------------+ PERO     Partial                                      Acute mid portion +---------+---------------+---------+-----------+----------+-----------------+   +----------+---------------+---------+-----------+----------+--------------+ LEFT      CompressibilityPhasicitySpontaneityPropertiesThrombus Aging +----------+---------------+---------+-----------+----------+--------------+ CFV       Partial        Yes      No                    Acute          +----------+---------------+---------+-----------+----------+--------------+ SFJ       Partial                                      Acute          +----------+---------------+---------+-----------+----------+--------------+ FV Prox   Full           Yes      Yes                                 +----------+---------------+---------+-----------+----------+--------------+ FV Mid    Full                                                        +----------+---------------+---------+-----------+----------+--------------+ FV Distal Full                                                        +----------+---------------+---------+-----------+----------+--------------+ PFV       Full           Yes      Yes                                 +----------+---------------+---------+-----------+----------+--------------+ POP       Partial        Yes      No                   Acute          +----------+---------------+---------+-----------+----------+--------------+ PTV       Full                                                        +----------+---------------+---------+-----------+----------+--------------+  PERO      Full                                                        +----------+---------------+---------+-----------+----------+--------------+ EIV                      Yes      Yes                  patent         +----------+---------------+---------+-----------+----------+--------------+ CIV Distal               Yes      Yes                  patent         +----------+---------------+---------+-----------+----------+--------------+     Summary: RIGHT: - Findings consistent with acute deep vein thrombosis involving the mid portion of the right posterior tibial veins, and right peroneal veins.  - No cystic structure found in the popliteal fossa.  LEFT: - Findings consistent with partially occlusive acute deep vein thrombosis involving the  left common femoral vein, SF junction, and left popliteal vein.  - No cystic structure found in the popliteal fossa. - External iliac and distal common iliac are patent  *See table(s) above for measurements and observations. Electronically signed by Norman Serve on 02/27/2024 at 4:36:41 PM.    Final    ECHOCARDIOGRAM COMPLETE Result Date: 02/27/2024    ECHOCARDIOGRAM REPORT   Patient Name:   [facility] Date of Exam: 02/27/2024 Medical Rec #:  [id number]     Height:       63.0 in Accession #:    [id number]    Weight:       184.7 lb Date of Birth:  02-09-1946      BSA:          1.870 m Patient Age:    78 years      BP:           112/45 mmHg Patient Gender: F             HR:           65 bpm. Exam Location:  Inpatient Procedure: 2D Echo, Cardiac Doppler and Color Doppler (Both Spectral and Color            Flow Doppler were utilized during procedure). Indications:    I26.02 Pulmonary embolus  History:        Patient has prior history of Echocardiogram examinations, most                 recent 10/20/2023. Abnormal ECG, Arrythmias:Atrial Fibrillation,                 Signs/Symptoms:Chest Pain, Dizziness/Lightheadedness, Syncope                 and Altered Mental Status; Risk Factors:Dyslipidemia and                 Diabetes. Cancer. OSA. 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  Sonographer:    Ellouise Mose RDCS Referring Phys: [id number] [facility]  Sonographer Comments: Technically difficult study due to poor echo windows and patient is obese. Image acquisition challenging due to patient body habitus. Patient with AMS attempted to turn. Talking throughout exam. IMPRESSIONS  1. Poor acoustic windows limit study.  2. Left ventricular ejection fraction, by estimation, is 65 to 70%. The left ventricle has normal function. The left ventricle has no regional wall motion abnormalities. Left ventricular diastolic parameters are indeterminate.  3. Right ventricular systolic function is normal. The right ventricular size is normal. There  is normal pulmonary artery systolic pressure. The estimated right ventricular systolic pressure is 33.4 mmHg.  4. Mild mitral valve regurgitation. Moderate mitral annular calcification.  5. The aortic valve has an indeterminant number of cusps. Aortic valve regurgitation is not visualized. Aortic valve sclerosis/calcification is present, without any evidence of aortic stenosis. FINDINGS  Left Ventricle: Left ventricular ejection fraction, by estimation, is 65 to 70%. The left ventricle has normal function. The left ventricle has no regional wall motion abnormalities. The left ventricular internal cavity size was normal in size. There is  no left ventricular hypertrophy. Left ventricular diastolic parameters are indeterminate. Right Ventricle: The right ventricular size is normal. Right vetricular wall thickness was not assessed. Right ventricular systolic function is normal. There is normal pulmonary artery systolic pressure. The tricuspid regurgitant velocity is 2.52 m/s, and with an assumed right atrial pressure of 8 mmHg, the estimated right ventricular systolic pressure is 33.4 mmHg. Left Atrium: Left atrial size was normal in size. Right Atrium: Right atrial size was normal in size. Pericardium: There is no evidence of pericardial effusion. Mitral Valve: There is mild thickening of the mitral valve leaflet(s). Moderate mitral annular calcification. Mild mitral valve regurgitation. Tricuspid Valve: The tricuspid valve is normal in structure. Tricuspid valve regurgitation is trivial. Aortic Valve: The aortic valve has an indeterminant number of cusps. Aortic valve regurgitation is not visualized. Aortic valve sclerosis/calcification is present, without any evidence of aortic stenosis. Pulmonic Valve: The pulmonic valve was not well visualized. Pulmonic valve regurgitation is not visualized. No evidence of pulmonic stenosis. Aorta: The aortic root is normal in size and structure. IAS/Shunts: No atrial level shunt  detected by color flow Doppler.  LEFT VENTRICLE PLAX 2D LVIDd:         4.50 cm     Diastology LVIDs:         3.10 cm     LV e' medial:    5.44 cm/s LV PW:         1.10 cm     LV E/e' medial:  13.8 LV IVS:        1.00 cm     LV e' lateral:   7.94 cm/s LVOT diam:     2.10 cm     LV E/e' lateral: 9.5 LV SV:         68 LV SV Index:   36 LVOT Area:     3.46 cm  LV Volumes (MOD) LV vol d, MOD A2C: 66.1 ml LV vol d, MOD A4C: 52.1 ml LV vol s, MOD A2C: 12.5 ml LV vol s, MOD A4C: 17.3 ml LV SV MOD A2C:     53.6 ml LV SV MOD A4C:     52.1 ml LV SV MOD BP:      43.6 ml RIGHT VENTRICLE             IVC RV S prime:     13.40 cm/s  IVC diam: 1.60 cm TAPSE (M-mode): 2.0 cm LEFT ATRIUM             Index        RIGHT ATRIUM  Index LA diam:        4.30 cm 2.30 cm/m   RA Area:     15.90 cm LA Vol (A2C):   18.9 ml 10.11 ml/m  RA Volume:   43.30 ml  23.16 ml/m LA Vol (A4C):   16.4 ml 8.77 ml/m LA Biplane Vol: 18.7 ml 10.00 ml/m  AORTIC VALVE LVOT Vmax:   92.00 cm/s LVOT Vmean:  60.200 cm/s LVOT VTI:    0.196 m  AORTA Ao Asc diam: 3.20 cm MITRAL VALVE               TRICUSPID VALVE MV Area (PHT): 3.08 cm    TR Peak grad:   25.4 mmHg MV Decel Time: 246 msec    TR Vmax:        252.00 cm/s MV E velocity: 75.10 cm/s MV A velocity: 98.80 cm/s  SHUNTS MV E/A ratio:  0.76        Systemic VTI:  0.20 m                            Systemic Diam: 2.10 cm Vina Gull MD Electronically signed by Vina Gull MD Signature Date/Time: 02/27/2024/11:45:24 AM    Final    CT CHEST ABDOMEN PELVIS W CONTRAST Result Date: 02/26/2024 CLINICAL DATA:  Kidney cancer, post nephrectomy, monitor surveillance visit for history of RCC s/p nephrectomy. lung nodules need to monitor. EXAM: CT CHEST, ABDOMEN, AND PELVIS WITH CONTRAST TECHNIQUE: Multidetector CT imaging of the chest, abdomen and pelvis was performed following the standard protocol during bolus administration of intravenous contrast. RADIATION DOSE REDUCTION: This exam was performed according to  the departmental dose-optimization program which includes automated exposure control, adjustment of the mA and/or kV according to patient size and/or use of iterative reconstruction technique. CONTRAST:  OMNIPAQUE  IOHEXOL  300 MG/ML  SOLN COMPARISON:  CT chest abdomen pelvis 10/18/2023 FINDINGS: CT CHEST FINDINGS Cardiovascular: There is a main pulmonary artery filling defect extending to the right main pulmonary artery as well as right upper and right middle segmental pulmonary arteries. Normal heart size. No significant pericardial effusion. The thoracic aorta is normal in caliber. No atherosclerotic plaque of the thoracic aorta. No coronary artery calcifications. Mitral annular calcification. Mediastinum/Nodes: No enlarged mediastinal, hilar, or axillary lymph nodes. Thyroid  gland, trachea, and esophagus demonstrate no significant findings. Lungs/Pleura: Stable right lower lobe paravertebral mucous plugging (7:1 20-127). Similar finding within the peripheral left lower lobe (7:104) with associated stable cluster of calcified pulmonary micronodules. Stable pulmonary punctate micronodule within the left lower lobe (7:94). Stable 5 mm left lower lobe subpleural nodule (7:68). Subpleural triangular lymph node along the left major fissure likely a intrapulmonary lymph node-no further follow-up indicated (7:90). No pulmonary mass. No pleural effusion. No pneumothorax. Musculoskeletal: No chest wall abnormality. No suspicious lytic or blastic osseous lesions. No acute displaced fracture. Multilevel degenerative changes of the spine. CT ABDOMEN PELVIS FINDINGS Hepatobiliary: Persistent mild nodular hepatic contour. No focal liver abnormality. Status post cholecystectomy. No biliary dilatation. Pancreas: Stable hypodense pancreatic lesions. Redemonstration of a 1.4 cm hypodense fluid density lesion along the proximal pancreas, a 1.4 cm fluid dense lesion along the pancreatic tail, a 0.6 cm distal pancreatic body  hypodensity (2:61). Normal pancreatic contour. No surrounding inflammatory changes. No main pancreatic ductal dilatation. Spleen: Normal in size without focal abnormality. Adrenals/Urinary Tract: No adrenal nodule bilaterally. Status post left nephrectomy. The right kidney enhances homogeneously. No right renal mass. No hydronephrosis. No hydroureter.  No nephroureterolithiasis. The urinary bladder is unremarkable. On delayed imaging, there is no urothelial wall thickening and there are no filling defects in the opacified portions of the right collecting system. Stomach/Bowel: Stomach is within normal limits. No evidence of bowel wall thickening or dilatation. Colonic diverticulosis. Status post appendectomy. Vascular/Lymphatic: No abdominal aorta or iliac aneurysm. Severe atherosclerotic plaque of the aorta and its branches. No abdominal, pelvic, or inguinal lymphadenopathy. Reproductive: Status post hysterectomy. No adnexal masses. Other: No intraperitoneal free fluid. No intraperitoneal free gas. No organized fluid collection. Musculoskeletal: No abdominal wall hernia or abnormality. No suspicious lytic or blastic osseous lesions. No acute displaced fracture. Multilevel severe degenerative changes of the spine. IMPRESSION: 1. Main, right main, and segmental right upper and middle lobe pulmonary emboli. Associated right heart strain. No pulmonary infarction. 2. No findings to suggest recurrent or metastatic malignancy in a patient with prior RCC status post left nephrectomy. Other imaging findings of potential clinical significance: 1. Stable bilateral lower lobe mucous plugging. Associated cluster of stable calcified pulmonary micronodules within the left lower lobe. 2. Couple stable pulmonary micronodules within the left lower lobe measuring up to 5 mm. 3. Stable hypodense pancreatic lesions. 4. Cirrhosis. No focal liver lesions identified. Please note that liver protocol enhanced MR and CT are the most  sensitive tests for the screening detection of hepatocellular carcinoma in the high risk setting of cirrhosis. 5. Colonic diverticulosis with no acute diverticulitis. 6.  Aortic Atherosclerosis (ICD10-I70.0). These results were called by telephone at the time of interpretation on 02/26/2024 at 7:59 pm to provider Christina Atoka traige nurse who verbally acknowledged these results. Electronically Signed   By: Morgane  Naveau M.D.   On: 02/26/2024 20:13    ASSESSMENT & PLAN Loretha Ure is a 78 y.o. female with medical history significant for a left renal mass, dilated appendix who presents for a follow up visit.   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  # Pulmonary Nodules  --CT abdomen/pelvis from 05/29/2022 showed findings concerning for renal cell carcinoma.  --underwent robotic assisted laparoscopic appendectomy and nephrectomy joint procedure on 08/14/2022. --Pathology findings are consistent for a benign appendix but clear-cell RCC of left kidney. --Etiology of the small lung nodules is unclear, though on last CT scan they are decreased in size (01/03/2023).   --Per NCCN guidelines, we will do labs and CT scan every 6 months for 3 years and then annually for up to 5 years. Plan:  --Labs today show white blood cell count 10.8, hemoglobin 15.0, MCV 89.2, platelets 201. Creatinine stable at 1.30. LFTs in range.  --No clinical signs of recurrence.  --CT CAP from 02/26/2024 showed no evidence of recurrence or metastatic disease. However did note new pulmonary emboli.  --Continue on surveillance and return in 6 months with repeat CT imaging.   # Multiple Pulmonary Emboli -- Noted on CT scan from 02/26/2024.  Noted to have main, right main, segmental right upper and middle lobe pulmonary emboli.  No heart strain. -- Started on Eliquis  therapy 5 mg twice daily -- Appears to be unprovoked in nature, would recommend indefinite anticoagulation.   #Pancreatic cystic lesions: --Last CT scan  from 06/30/2023 showed possibly mild progression of size, although likely benign.  --Continue to monitor.   No orders of the defined types were placed in this encounter.  All questions were answered. The patient knows to call the clinic with any problems, questions or concerns.  A total of more than 30 minutes were spent on  this encounter with face-to-face time and non-face-to-face time, including preparing to see the patient, ordering tests and/or medications, counseling the patient and coordination of care as outlined above.   Norleen IVAR Kidney, MD Department of Hematology/Oncology [facility] Phone: [phone] Pager: [phone] Email: [email or web address]  03/02/2024 1:48 PM

## 2024-03-01 ENCOUNTER — Inpatient Hospital Stay

## 2024-03-01 ENCOUNTER — Other Ambulatory Visit: Payer: Self-pay

## 2024-03-01 ENCOUNTER — Inpatient Hospital Stay (HOSPITAL_BASED_OUTPATIENT_CLINIC_OR_DEPARTMENT_OTHER): Admitting: Hematology and Oncology

## 2024-03-01 VITALS — BP 117/80 | HR 60 | Temp 97.8°F | Resp 17 | Ht 63.0 in | Wt 183.0 lb

## 2024-03-01 DIAGNOSIS — I2692 Saddle embolus of pulmonary artery without acute cor pulmonale: Secondary | ICD-10-CM | POA: Diagnosis not present

## 2024-03-01 DIAGNOSIS — S0990XA Unspecified injury of head, initial encounter: Secondary | ICD-10-CM | POA: Diagnosis not present

## 2024-03-01 DIAGNOSIS — C642 Malignant neoplasm of left kidney, except renal pelvis: Secondary | ICD-10-CM | POA: Diagnosis not present

## 2024-03-01 DIAGNOSIS — S0003XA Contusion of scalp, initial encounter: Secondary | ICD-10-CM | POA: Diagnosis not present

## 2024-03-01 DIAGNOSIS — N2889 Other specified disorders of kidney and ureter: Secondary | ICD-10-CM | POA: Diagnosis not present

## 2024-03-01 DIAGNOSIS — Z905 Acquired absence of kidney: Secondary | ICD-10-CM | POA: Insufficient documentation

## 2024-03-01 DIAGNOSIS — S199XXA Unspecified injury of neck, initial encounter: Secondary | ICD-10-CM | POA: Diagnosis not present

## 2024-03-01 DIAGNOSIS — Z85528 Personal history of other malignant neoplasm of kidney: Secondary | ICD-10-CM | POA: Insufficient documentation

## 2024-03-01 DIAGNOSIS — M545 Low back pain, unspecified: Secondary | ICD-10-CM | POA: Diagnosis not present

## 2024-03-01 DIAGNOSIS — I771 Stricture of artery: Secondary | ICD-10-CM | POA: Diagnosis not present

## 2024-03-01 DIAGNOSIS — R918 Other nonspecific abnormal finding of lung field: Secondary | ICD-10-CM

## 2024-03-01 DIAGNOSIS — I2699 Other pulmonary embolism without acute cor pulmonale: Secondary | ICD-10-CM

## 2024-03-01 DIAGNOSIS — M47816 Spondylosis without myelopathy or radiculopathy, lumbar region: Secondary | ICD-10-CM | POA: Diagnosis not present

## 2024-03-01 DIAGNOSIS — Z08 Encounter for follow-up examination after completed treatment for malignant neoplasm: Secondary | ICD-10-CM | POA: Insufficient documentation

## 2024-03-01 DIAGNOSIS — Z7901 Long term (current) use of anticoagulants: Secondary | ICD-10-CM | POA: Insufficient documentation

## 2024-03-01 DIAGNOSIS — K5730 Diverticulosis of large intestine without perforation or abscess without bleeding: Secondary | ICD-10-CM | POA: Diagnosis not present

## 2024-03-01 DIAGNOSIS — R42 Dizziness and giddiness: Secondary | ICD-10-CM | POA: Diagnosis not present

## 2024-03-01 DIAGNOSIS — J984 Other disorders of lung: Secondary | ICD-10-CM | POA: Diagnosis not present

## 2024-03-01 DIAGNOSIS — M9933 Osseous stenosis of neural canal of lumbar region: Secondary | ICD-10-CM | POA: Diagnosis not present

## 2024-03-01 DIAGNOSIS — Z043 Encounter for examination and observation following other accident: Secondary | ICD-10-CM | POA: Diagnosis not present

## 2024-03-01 DIAGNOSIS — K862 Cyst of pancreas: Secondary | ICD-10-CM | POA: Insufficient documentation

## 2024-03-01 DIAGNOSIS — I700 Atherosclerosis of aorta: Secondary | ICD-10-CM | POA: Diagnosis not present

## 2024-03-01 DIAGNOSIS — S22009A Unspecified fracture of unspecified thoracic vertebra, initial encounter for closed fracture: Secondary | ICD-10-CM | POA: Diagnosis not present

## 2024-03-01 LAB — CMP (CANCER CENTER ONLY)
ALT: 34 U/L (ref 0–44)
AST: 42 U/L — ABNORMAL HIGH (ref 15–41)
Albumin: 3.7 g/dL (ref 3.5–5.0)
Alkaline Phosphatase: 205 U/L — ABNORMAL HIGH (ref 38–126)
Anion gap: 7 (ref 5–15)
BUN: 28 mg/dL — ABNORMAL HIGH (ref 8–23)
CO2: 32 mmol/L (ref 22–32)
Calcium: 9.2 mg/dL (ref 8.9–10.3)
Chloride: 103 mmol/L (ref 98–111)
Creatinine: 1.3 mg/dL — ABNORMAL HIGH (ref 0.44–1.00)
GFR, Estimated: 42 mL/min — ABNORMAL LOW (ref >60)
Glucose, Bld: 49 mg/dL — ABNORMAL LOW (ref 70–99)
Potassium: 3.4 mmol/L — ABNORMAL LOW (ref 3.5–5.1)
Sodium: 142 mmol/L (ref 135–145)
Total Bilirubin: 1.1 mg/dL (ref 0.0–1.2)
Total Protein: 6.5 g/dL (ref 6.5–8.1)

## 2024-03-01 LAB — CBC WITH DIFFERENTIAL (CANCER CENTER ONLY)
Abs Immature Granulocytes: 0.1 K/uL — ABNORMAL HIGH (ref 0.00–0.07)
Basophils Absolute: 0.1 K/uL (ref 0.0–0.1)
Basophils Relative: 1 %
Eosinophils Absolute: 0.2 K/uL (ref 0.0–0.5)
Eosinophils Relative: 2 %
HCT: 44.6 % (ref 36.0–46.0)
Hemoglobin: 15 g/dL (ref 12.0–15.0)
Immature Granulocytes: 1 %
Lymphocytes Relative: 22 %
Lymphs Abs: 2.4 K/uL (ref 0.7–4.0)
MCH: 30 pg (ref 26.0–34.0)
MCHC: 33.6 g/dL (ref 30.0–36.0)
MCV: 89.2 fL (ref 80.0–100.0)
Monocytes Absolute: 0.8 K/uL (ref 0.1–1.0)
Monocytes Relative: 8 %
Neutro Abs: 7.2 K/uL (ref 1.7–7.7)
Neutrophils Relative %: 66 %
Platelet Count: 201 K/uL (ref 150–400)
RBC: 5 MIL/uL (ref 3.87–5.11)
RDW: 13.4 % (ref 11.5–15.5)
WBC Count: 10.8 K/uL — ABNORMAL HIGH (ref 4.0–10.5)
nRBC: 0 % (ref 0.0–0.2)

## 2024-03-02 ENCOUNTER — Emergency Department (HOSPITAL_COMMUNITY)

## 2024-03-02 ENCOUNTER — Inpatient Hospital Stay (HOSPITAL_COMMUNITY)
Admission: EM | Admit: 2024-03-02 | Discharge: 2024-03-08 | DRG: 163 | Disposition: A | Discharge status: Home Health | Attending: Pulmonary Disease | Admitting: Pulmonary Disease

## 2024-03-02 ENCOUNTER — Other Ambulatory Visit: Payer: Self-pay

## 2024-03-02 DIAGNOSIS — I700 Atherosclerosis of aorta: Secondary | ICD-10-CM | POA: Diagnosis not present

## 2024-03-02 DIAGNOSIS — J439 Emphysema, unspecified: Secondary | ICD-10-CM | POA: Diagnosis present

## 2024-03-02 DIAGNOSIS — G319 Degenerative disease of nervous system, unspecified: Secondary | ICD-10-CM | POA: Diagnosis not present

## 2024-03-02 DIAGNOSIS — Z794 Long term (current) use of insulin: Secondary | ICD-10-CM

## 2024-03-02 DIAGNOSIS — R918 Other nonspecific abnormal finding of lung field: Secondary | ICD-10-CM | POA: Diagnosis not present

## 2024-03-02 DIAGNOSIS — I771 Stricture of artery: Secondary | ICD-10-CM | POA: Diagnosis not present

## 2024-03-02 DIAGNOSIS — R531 Weakness: Secondary | ICD-10-CM

## 2024-03-02 DIAGNOSIS — Z884 Allergy status to anesthetic agent status: Secondary | ICD-10-CM

## 2024-03-02 DIAGNOSIS — Z91048 Other nonmedicinal substance allergy status: Secondary | ICD-10-CM

## 2024-03-02 DIAGNOSIS — Z79899 Other long term (current) drug therapy: Secondary | ICD-10-CM

## 2024-03-02 DIAGNOSIS — Z7952 Long term (current) use of systemic steroids: Secondary | ICD-10-CM

## 2024-03-02 DIAGNOSIS — W1830XA Fall on same level, unspecified, initial encounter: Secondary | ICD-10-CM

## 2024-03-02 DIAGNOSIS — E1122 Type 2 diabetes mellitus with diabetic chronic kidney disease: Secondary | ICD-10-CM | POA: Diagnosis present

## 2024-03-02 DIAGNOSIS — R296 Repeated falls: Secondary | ICD-10-CM | POA: Diagnosis present

## 2024-03-02 DIAGNOSIS — I82403 Acute embolism and thrombosis of unspecified deep veins of lower extremity, bilateral: Secondary | ICD-10-CM | POA: Diagnosis not present

## 2024-03-02 DIAGNOSIS — I82412 Acute embolism and thrombosis of left femoral vein: Secondary | ICD-10-CM | POA: Diagnosis present

## 2024-03-02 DIAGNOSIS — E1142 Type 2 diabetes mellitus with diabetic polyneuropathy: Secondary | ICD-10-CM | POA: Diagnosis present

## 2024-03-02 DIAGNOSIS — N1831 Chronic kidney disease, stage 3a: Secondary | ICD-10-CM | POA: Diagnosis present

## 2024-03-02 DIAGNOSIS — E1165 Type 2 diabetes mellitus with hyperglycemia: Secondary | ICD-10-CM

## 2024-03-02 DIAGNOSIS — I2692 Saddle embolus of pulmonary artery without acute cor pulmonale: Principal | ICD-10-CM | POA: Diagnosis present

## 2024-03-02 DIAGNOSIS — Z90711 Acquired absence of uterus with remaining cervical stump: Secondary | ICD-10-CM

## 2024-03-02 DIAGNOSIS — I130 Hypertensive heart and chronic kidney disease with heart failure and stage 1 through stage 4 chronic kidney disease, or unspecified chronic kidney disease: Secondary | ICD-10-CM | POA: Diagnosis present

## 2024-03-02 DIAGNOSIS — Z832 Family history of diseases of the blood and blood-forming organs and certain disorders involving the immune mechanism: Secondary | ICD-10-CM

## 2024-03-02 DIAGNOSIS — I2699 Other pulmonary embolism without acute cor pulmonale: Principal | ICD-10-CM | POA: Diagnosis present

## 2024-03-02 DIAGNOSIS — K754 Autoimmune hepatitis: Secondary | ICD-10-CM | POA: Diagnosis present

## 2024-03-02 DIAGNOSIS — F419 Anxiety disorder, unspecified: Secondary | ICD-10-CM | POA: Diagnosis present

## 2024-03-02 DIAGNOSIS — Z833 Family history of diabetes mellitus: Secondary | ICD-10-CM

## 2024-03-02 DIAGNOSIS — M545 Low back pain, unspecified: Secondary | ICD-10-CM | POA: Diagnosis not present

## 2024-03-02 DIAGNOSIS — Z86718 Personal history of other venous thrombosis and embolism: Secondary | ICD-10-CM

## 2024-03-02 DIAGNOSIS — M9933 Osseous stenosis of neural canal of lumbar region: Secondary | ICD-10-CM | POA: Diagnosis not present

## 2024-03-02 DIAGNOSIS — N390 Urinary tract infection, site not specified: Secondary | ICD-10-CM | POA: Diagnosis present

## 2024-03-02 DIAGNOSIS — I2693 Single subsegmental pulmonary embolism without acute cor pulmonale: Secondary | ICD-10-CM | POA: Diagnosis not present

## 2024-03-02 DIAGNOSIS — K219 Gastro-esophageal reflux disease without esophagitis: Secondary | ICD-10-CM | POA: Diagnosis present

## 2024-03-02 DIAGNOSIS — G9341 Metabolic encephalopathy: Secondary | ICD-10-CM | POA: Diagnosis not present

## 2024-03-02 DIAGNOSIS — Z905 Acquired absence of kidney: Secondary | ICD-10-CM

## 2024-03-02 DIAGNOSIS — S0003XA Contusion of scalp, initial encounter: Secondary | ICD-10-CM | POA: Diagnosis not present

## 2024-03-02 DIAGNOSIS — Z85528 Personal history of other malignant neoplasm of kidney: Secondary | ICD-10-CM

## 2024-03-02 DIAGNOSIS — I5032 Chronic diastolic (congestive) heart failure: Secondary | ICD-10-CM | POA: Diagnosis present

## 2024-03-02 DIAGNOSIS — Z723 Lack of physical exercise: Secondary | ICD-10-CM

## 2024-03-02 DIAGNOSIS — J984 Other disorders of lung: Secondary | ICD-10-CM | POA: Diagnosis not present

## 2024-03-02 DIAGNOSIS — I2609 Other pulmonary embolism with acute cor pulmonale: Principal | ICD-10-CM

## 2024-03-02 DIAGNOSIS — E66812 Obesity, class 2: Secondary | ICD-10-CM | POA: Diagnosis present

## 2024-03-02 DIAGNOSIS — I82409 Acute embolism and thrombosis of unspecified deep veins of unspecified lower extremity: Secondary | ICD-10-CM | POA: Insufficient documentation

## 2024-03-02 DIAGNOSIS — I82441 Acute embolism and thrombosis of right tibial vein: Secondary | ICD-10-CM | POA: Diagnosis present

## 2024-03-02 DIAGNOSIS — Z91041 Radiographic dye allergy status: Secondary | ICD-10-CM

## 2024-03-02 DIAGNOSIS — Z885 Allergy status to narcotic agent status: Secondary | ICD-10-CM

## 2024-03-02 DIAGNOSIS — R42 Dizziness and giddiness: Secondary | ICD-10-CM | POA: Diagnosis not present

## 2024-03-02 DIAGNOSIS — Y92008 Other place in unspecified non-institutional (private) residence as the place of occurrence of the external cause: Secondary | ICD-10-CM

## 2024-03-02 DIAGNOSIS — K5730 Diverticulosis of large intestine without perforation or abscess without bleeding: Secondary | ICD-10-CM | POA: Diagnosis not present

## 2024-03-02 DIAGNOSIS — R5381 Other malaise: Secondary | ICD-10-CM | POA: Diagnosis present

## 2024-03-02 DIAGNOSIS — K7460 Unspecified cirrhosis of liver: Secondary | ICD-10-CM | POA: Diagnosis present

## 2024-03-02 DIAGNOSIS — S0083XA Contusion of other part of head, initial encounter: Secondary | ICD-10-CM | POA: Diagnosis present

## 2024-03-02 DIAGNOSIS — S0181XA Laceration without foreign body of other part of head, initial encounter: Secondary | ICD-10-CM | POA: Diagnosis not present

## 2024-03-02 DIAGNOSIS — M47816 Spondylosis without myelopathy or radiculopathy, lumbar region: Secondary | ICD-10-CM | POA: Diagnosis not present

## 2024-03-02 DIAGNOSIS — S0990XA Unspecified injury of head, initial encounter: Secondary | ICD-10-CM | POA: Diagnosis not present

## 2024-03-02 DIAGNOSIS — Z86711 Personal history of pulmonary embolism: Secondary | ICD-10-CM

## 2024-03-02 DIAGNOSIS — I6782 Cerebral ischemia: Secondary | ICD-10-CM | POA: Diagnosis not present

## 2024-03-02 DIAGNOSIS — I951 Orthostatic hypotension: Secondary | ICD-10-CM | POA: Diagnosis present

## 2024-03-02 DIAGNOSIS — R55 Syncope and collapse: Secondary | ICD-10-CM | POA: Diagnosis not present

## 2024-03-02 DIAGNOSIS — R4182 Altered mental status, unspecified: Secondary | ICD-10-CM | POA: Diagnosis not present

## 2024-03-02 DIAGNOSIS — Z825 Family history of asthma and other chronic lower respiratory diseases: Secondary | ICD-10-CM

## 2024-03-02 DIAGNOSIS — I509 Heart failure, unspecified: Secondary | ICD-10-CM | POA: Diagnosis not present

## 2024-03-02 DIAGNOSIS — R609 Edema, unspecified: Secondary | ICD-10-CM | POA: Diagnosis not present

## 2024-03-02 DIAGNOSIS — E119 Type 2 diabetes mellitus without complications: Secondary | ICD-10-CM | POA: Diagnosis not present

## 2024-03-02 DIAGNOSIS — E7800 Pure hypercholesterolemia, unspecified: Secondary | ICD-10-CM | POA: Diagnosis present

## 2024-03-02 DIAGNOSIS — Z888 Allergy status to other drugs, medicaments and biological substances status: Secondary | ICD-10-CM

## 2024-03-02 DIAGNOSIS — R220 Localized swelling, mass and lump, head: Secondary | ICD-10-CM | POA: Diagnosis not present

## 2024-03-02 DIAGNOSIS — Z9641 Presence of insulin pump (external) (internal): Secondary | ICD-10-CM | POA: Diagnosis present

## 2024-03-02 DIAGNOSIS — Z7901 Long term (current) use of anticoagulants: Secondary | ICD-10-CM

## 2024-03-02 DIAGNOSIS — Z043 Encounter for examination and observation following other accident: Secondary | ICD-10-CM | POA: Diagnosis not present

## 2024-03-02 DIAGNOSIS — W1839XA Other fall on same level, initial encounter: Secondary | ICD-10-CM | POA: Diagnosis present

## 2024-03-02 DIAGNOSIS — Z881 Allergy status to other antibiotic agents status: Secondary | ICD-10-CM

## 2024-03-02 DIAGNOSIS — S22009A Unspecified fracture of unspecified thoracic vertebra, initial encounter for closed fracture: Secondary | ICD-10-CM | POA: Diagnosis not present

## 2024-03-02 DIAGNOSIS — W19XXXA Unspecified fall, initial encounter: Secondary | ICD-10-CM | POA: Diagnosis not present

## 2024-03-02 DIAGNOSIS — K862 Cyst of pancreas: Secondary | ICD-10-CM | POA: Diagnosis present

## 2024-03-02 DIAGNOSIS — Z6832 Body mass index (BMI) 32.0-32.9, adult: Secondary | ICD-10-CM

## 2024-03-02 DIAGNOSIS — B961 Klebsiella pneumoniae [K. pneumoniae] as the cause of diseases classified elsewhere: Secondary | ICD-10-CM | POA: Diagnosis present

## 2024-03-02 DIAGNOSIS — S199XXA Unspecified injury of neck, initial encounter: Secondary | ICD-10-CM | POA: Diagnosis not present

## 2024-03-02 DIAGNOSIS — I2602 Saddle embolus of pulmonary artery with acute cor pulmonale: Secondary | ICD-10-CM | POA: Diagnosis not present

## 2024-03-02 HISTORY — DX: Allergy, unspecified, initial encounter: T78.40XA

## 2024-03-02 HISTORY — DX: Encounter for other specified aftercare: Z51.89

## 2024-03-02 HISTORY — DX: Emphysema, unspecified: J43.9

## 2024-03-02 HISTORY — DX: Unspecified cataract: H26.9

## 2024-03-02 LAB — CBC WITH DIFFERENTIAL/PLATELET
Abs Immature Granulocytes: 0.1 K/uL — ABNORMAL HIGH (ref 0.00–0.07)
Basophils Absolute: 0.1 K/uL (ref 0.0–0.1)
Basophils Relative: 1 %
Eosinophils Absolute: 0.2 K/uL (ref 0.0–0.5)
Eosinophils Relative: 2 %
HCT: 44 % (ref 36.0–46.0)
Hemoglobin: 14.4 g/dL (ref 12.0–15.0)
Immature Granulocytes: 1 %
Lymphocytes Relative: 28 %
Lymphs Abs: 2.3 K/uL (ref 0.7–4.0)
MCH: 30.3 pg (ref 26.0–34.0)
MCHC: 32.7 g/dL (ref 30.0–36.0)
MCV: 92.6 fL (ref 80.0–100.0)
Monocytes Absolute: 0.7 K/uL (ref 0.1–1.0)
Monocytes Relative: 9 %
Neutro Abs: 5 K/uL (ref 1.7–7.7)
Neutrophils Relative %: 59 %
Platelets: 175 K/uL (ref 150–400)
RBC: 4.75 MIL/uL (ref 3.87–5.11)
RDW: 13.5 % (ref 11.5–15.5)
WBC: 8.4 K/uL (ref 4.0–10.5)
nRBC: 0 % (ref 0.0–0.2)

## 2024-03-02 LAB — CBG MONITORING, ED
Glucose-Capillary: 60 mg/dL — ABNORMAL LOW (ref 70–99)
Glucose-Capillary: 154 mg/dL — ABNORMAL HIGH (ref 70–99)

## 2024-03-02 LAB — TROPONIN I (HIGH SENSITIVITY)
Troponin I (High Sensitivity): 9 ng/L (ref <18)
Troponin I (High Sensitivity): 9 ng/L (ref <18)

## 2024-03-02 LAB — COMPREHENSIVE METABOLIC PANEL WITH GFR
ALT: 31 U/L (ref 0–44)
AST: 42 U/L — ABNORMAL HIGH (ref 15–41)
Albumin: 3.1 g/dL — ABNORMAL LOW (ref 3.5–5.0)
Alkaline Phosphatase: 177 U/L — ABNORMAL HIGH (ref 38–126)
Anion gap: 13 (ref 5–15)
BUN: 25 mg/dL — ABNORMAL HIGH (ref 8–23)
CO2: 25 mmol/L (ref 22–32)
Calcium: 9.1 mg/dL (ref 8.9–10.3)
Chloride: 101 mmol/L (ref 98–111)
Creatinine, Ser: 1.31 mg/dL — ABNORMAL HIGH (ref 0.44–1.00)
GFR, Estimated: 42 mL/min — ABNORMAL LOW (ref >60)
Glucose, Bld: 65 mg/dL — ABNORMAL LOW (ref 70–99)
Potassium: 3.6 mmol/L (ref 3.5–5.1)
Sodium: 139 mmol/L (ref 135–145)
Total Bilirubin: 1 mg/dL (ref 0.0–1.2)
Total Protein: 5.8 g/dL — ABNORMAL LOW (ref 6.5–8.1)

## 2024-03-02 MED ORDER — LACTATED RINGERS IV BOLUS
1000.0000 mL | Freq: Once | INTRAVENOUS | Status: AC
  Administered 2024-03-02 (×2): 1000 mL via INTRAVENOUS

## 2024-03-02 MED ORDER — DIPHENHYDRAMINE HCL 50 MG/ML IJ SOLN
50.0000 mg | Freq: Once | INTRAMUSCULAR | Status: AC
  Administered 2024-03-02 (×2): 50 mg via INTRAVENOUS
  Filled 2024-03-02: qty 1

## 2024-03-02 MED ORDER — METHYLPREDNISOLONE SODIUM SUCC 40 MG IJ SOLR
40.0000 mg | Freq: Once | INTRAMUSCULAR | Status: AC
  Administered 2024-03-02 (×2): 40 mg via INTRAVENOUS
  Filled 2024-03-02: qty 1

## 2024-03-02 MED ORDER — DEXTROSE 50 % IV SOLN
50.0000 mL | Freq: Once | INTRAVENOUS | Status: AC
  Administered 2024-03-02 (×2): 50 mL via INTRAVENOUS
  Filled 2024-03-02: qty 50

## 2024-03-02 MED ORDER — DIPHENHYDRAMINE HCL 25 MG PO CAPS: 50.0000 mg | ORAL_CAPSULE | Freq: Once | ORAL | Status: AC

## 2024-03-02 MED ORDER — HYDROCODONE-ACETAMINOPHEN 5-325 MG PO TABS
1.0000 | ORAL_TABLET | Freq: Once | ORAL | Status: AC
  Administered 2024-03-02 (×2): 1 via ORAL
  Filled 2024-03-02: qty 1

## 2024-03-02 NOTE — ED Provider Notes (Signed)
 [facility] Provider Note  CSN: [id number] Arrival date & time: 03/02/24 1713  Chief Complaint(s) Fall  HPI [name] [name] is a 78 y.o. female with PMH renal cell carcinoma status post nephrectomy with solitary kidney, T2DM insulin -dependent, CHF, autoimmune hepatitis/cirrhosis, anxiety, GERD, recent hospital admission and discharged 3 days ago for large pulmonary embolism currently on Eliquis  who presents to the emergency department for evaluation of a fall.  Patient arrives as a level 2 trauma for fall on blood thinners.  Additional history obtained from patient's family states that she has a history with orthostatic hypotension and does have a history of frequent falls.  On arrival, patient denies chest pain, shortness of breath, abdominal pain nausea vomiting or other systemic symptoms.  Endorses mid back pain and neck pain.   Past Medical History Past Medical History:  Diagnosis Date   Allergic rhinitis, cause unspecified    Anxiety    Arthritis    hands, feet and back   Asthma    Benign neoplasm of colon    Bursitis    hips   CHF (congestive heart failure) (HCC)    Cirrhosis (HCC)    auto immune   Complication of anesthesia     i WAKE UP WITH TREMORS  after shoulder surgery tremors for 10 months after surgery surgery- 2009- surgical center in [place]   Cyst and pseudocyst of pancreas    Depressive disorder, not elsewhere classified    Diabetes mellitus without complication (HCC)    Diverticulitis    Diverticulosis of colon (without mention of hemorrhage)    Dyspnea    Dysrhythmia    arrhythmia   Esophageal reflux    Essential and other specified forms of tremor    History of GI bleed    History of kidney stones    Irritable bowel syndrome    Obesity, unspecified    Other specified cardiac dysrhythmias(427.89)    Periapical abscess without sinus    Polyneuropathy in diabetes(357.2)    PONV (postoperative nausea and  vomiting)    Pure hypercholesterolemia    Renal cell carcinoma (HCC)    Sleep apnea    CPAP- non compliant   Type II or unspecified type diabetes mellitus with neurological manifestations, not stated as uncontrolled(250.60)    Patient Active Problem List   Diagnosis Date Noted   Acute pulmonary embolus (HCC) 02/26/2024   Weakness generalized 11/18/2023   PAF (paroxysmal atrial fibrillation) (HCC) 11/18/2023   Irritability and anger 11/18/2023   Dizziness 10/18/2023   Near syncope 10/18/2023   MDD (major depressive disorder), recurrent episode, moderate (HCC) 07/11/2023   Confusion 07/08/2023   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   Acute on chronic low back pain 06/08/2023   Intractable low back pain 06/06/2023   CKD stage 3a, GFR 45-59 ml/min (HCC) 06/06/2023   Lumbar radiculopathy 06/06/2023   Cirrhosis (HCC)    Renal cell cancer (HCC) 09/12/2022   Aortic atherosclerosis (HCC) 06/04/2022   Renal mass, left 05/27/2022   Swelling of limb 04/12/2022   Osteopenia 09/21/2021   History of nephrolithiasis 09/21/2021   Sensation of pressure in bladder area 09/21/2021   Anemia 08/17/2019   Hypertension 08/17/2019   Anxiety 08/17/2019   Lower GI bleeding 08/17/2019   Acute lower GI bleeding 08/16/2019   Autoimmune hepatitis (HCC) 12/29/2018   Chest pain 12/22/2018   Asthmatic bronchitis with exacerbation 03/21/2017   Elevated LFTs 12/24/2016   SVT (supraventricular tachycardia) (HCC) 12/24/2016   Peripheral edema  12/24/2016   S/P foot surgery, right 07/27/2015   Preoperative clearance 06/29/2015   Angioedema 08/30/2014   Recurrent UTI 01/06/2014   Chronic insomnia 01/06/2014   Acute bilateral low back pain without sciatica 03/07/2011   Vitamin D  deficiency 08/20/2010   TINNITUS, CHRONIC, 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   Lung nodule 08/03/2007   CYST AND PSEUDOCYST OF PANCREAS 08/03/2007   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   TUBULOVILLOUS ADENOMA, COLON 12/30/2006    HYPERCHOLESTEROLEMIA 12/30/2006   OBESITY 12/30/2006   TREMOR, ESSENTIAL 12/30/2006   Palpitations 12/30/2006   Seasonal and perennial allergic rhinitis 12/30/2006   GERD 12/30/2006   DIVERTICULOSIS, COLON 12/30/2006   IBS 12/30/2006   Obstructive sleep apnea 12/30/2006   URINARY INCONTINENCE 12/30/2006   Poorly controlled type 2 diabetes mellitus (HCC) 12/22/2006   Home Medication(s) Prior to Admission medications   Medication Sig Start Date End Date Taking? Authorizing Provider  albuterol  (PROAIR  HFA) 108 (90 Base) MCG/ACT inhaler Inhale 2 puffs into the lungs every 6 (six) hours as needed for wheezing or shortness of breath. 06/13/23   Bedsole, Amy E, MD  apixaban  (ELIQUIS ) 5 MG TABS tablet Take 2 tablets (10 mg total) by mouth 2 (two) times daily for 6 days, THEN 1 tablet (5 mg total) 2 (two) times daily. 02/28/24 05/04/24  Jillian Buttery, MD  atenolol  (TENORMIN ) 25 MG tablet TAKE 1 TABLET (25 MG TOTAL) BY MOUTH DAILY. 01/19/24   Court Dorn PARAS, MD  cetirizine (ZYRTEC) 10 MG tablet Take 10 mg by mouth daily.    [provider]  Coenzyme Q10 100 MG TABS Take 100 mg by mouth daily.    [provider]  CRANBERRY PO Take 1,000 mg by mouth in the morning and at bedtime.    [provider]  D-MANNOSE PO Take 4,000 mg by mouth in the morning and at bedtime.    [provider]  DULoxetine  (CYMBALTA ) 60 MG capsule Take 1 capsule (60 mg total) by mouth daily. 07/11/23   Bedsole, Amy E, MD  furosemide  (LASIX ) 40 MG tablet Take 40 mg by mouth daily.    [provider]  guaiFENesin  (MUCINEX ) 600 MG 12 hr tablet Take 1 tablet (600 mg total) by mouth 2 (two) times daily for 7 days. 02/28/24 03/06/24  Jillian Buttery, MD  HYDROcodone -acetaminophen  (NORCO/VICODIN) 5-325 MG tablet Take 1 tablet by mouth in the morning and at bedtime. 02/16/24   [provider]  hydrOXYzine  (ATARAX ) 25 MG tablet Take 25 mg by mouth 2 (two) times daily as needed for  anxiety.    [provider]  insulin  lispro (HUMALOG) 100 UNIT/ML injection Inject into the skin 3 (three) times daily before meals.    [provider]  nitrofurantoin , macrocrystal-monohydrate, (MACROBID ) 100 MG capsule Take 100 mg by mouth 2 (two) times daily.    [provider]  pantoprazole  (PROTONIX ) 40 MG tablet TAKE 1 TABLET BY MOUTH EVERY DAY 09/17/19   Wendling, Mabel Mt, DO  polyethylene glycol (MIRALAX  / GLYCOLAX ) 17 g packet Take 17 g by mouth daily as needed for mild constipation. 06/09/23   Hongalgi, Anand D, MD  predniSONE  (DELTASONE ) 10 MG tablet Take 10 mg by mouth daily with breakfast.    [provider]  promethazine  (PHENERGAN ) 12.5 MG tablet Take 12.5 mg by mouth 2 (two) times daily as needed for nausea. 02/16/24   [provider]  traMADol  (ULTRAM ) 50 MG tablet Take 1 tablet (50 mg total) by mouth every 8 (eight) hours as needed for severe  pain (pain score 7-10). 10/21/23   Amin, Ankit C, MD  UNABLE TO FIND Take 1 tablet by mouth in the morning and at bedtime. Med Name: Korene NOVAK Vitamin    [provider]                                                                                                                                    Past Surgical History Past Surgical History:  Procedure Laterality Date   ABDOMINAL HYSTERECTOMY     CALCANEAL OSTEOTOMY Right 07/27/2015   Procedure: RIGHT CALCANEAL OSTEOTOMY ;  Surgeon: Norleen Armor, MD;  Location: [facility];  Service: Orthopedics;  Laterality: Right;   CHOLECYSTECTOMY     15-20 years ago   COLONOSCOPY WITH PROPOFOL  N/A 08/17/2019   Procedure: COLONOSCOPY WITH PROPOFOL ;  Surgeon: Rollin Dover, MD;  Location: [facility] END[facility]COPY;  Service: Endoscopy;  Laterality: N/A;   COLONOSCOPY WITH PROPOFOL  N/A 06/21/2022   Procedure: COLONOSCOPY WITH PROPOFOL ;  Surgeon: Rollin Dover, MD;  Location: [facility];  Service: Gastroenterology;  Laterality: N/A;    ESOPHAGOGASTRODUODENOSCOPY (EGD) WITH PROPOFOL  N/A 08/17/2019   Procedure: ESOPHAGOGASTRODUODENOSCOPY (EGD) WITH PROPOFOL ;  Surgeon: Rollin Dover, MD;  Location: [facility] END[facility]COPY;  Service: Endoscopy;  Laterality: N/A;   GASTROCNEMIUS RECESSION Right 07/27/2015   Procedure: RIGHT GASTROC RECESSION;  Surgeon: Norleen Armor, MD;  Location: [facility];  Service: Orthopedics;  Laterality: Right;   KIDNEY STONE SURGERY  1980's   removal   LITHOTRIPSY     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   PARTIAL HYSTERECTOMY  1990's   POLYPECTOMY  08/17/2019   Procedure: POLYPECTOMY;  Surgeon: Rollin Dover, MD;  Location: [facility];  Service: Endoscopy;;   POLYPECTOMY  06/21/2022   Procedure: POLYPECTOMY;  Surgeon: Rollin Dover, MD;  Location: [facility];  Service: Gastroenterology;;   ROBOT ASSISTED LAPAROSCOPIC NEPHRECTOMY Left 08/14/2022   Procedure: XI ROBOTIC ASSISTED LAPAROSCOPIC NEPHRECTOMY;  Surgeon: Devere Lonni Righter, MD;  Location: [facility];  Service: Urology;  Laterality: Left;   ROTATOR CUFF REPAIR  2005   XI ROBOTIC LAPAROSCOPIC ASSISTED APPENDECTOMY N/A 08/14/2022   Procedure: XI ROBOTIC LAPAROSCOPIC ASSISTED APPENDECTOMY;  Surgeon: Debby Hila, MD;  Location: [facility];  Service: General;  Laterality: N/A;   Family History Family History  Problem Relation Age of Onset   Clotting disorder Mother    Cirrhosis Father    Diabetes Other        2 aunts   Cancer Other        grandmother   Allergies Other        whole family   Asthma Other        whole family    Social History Social History   Tobacco Use   Smoking status: Never   Smokeless tobacco: Never  Vaping Use   Vaping status: Never Used  Substance Use Topics   Alcohol use: No    Alcohol/week: 0.0 standard drinks of  alcohol   Drug use: No   Allergies Codeine , Doxycycline , Epinephrine  hcl, Erythromycin ethylsuccinate, Eucalyptus oil, Iohexol , Lidocaine , Menthol, Metformin , Oxycodone , and Tape  Review of Systems Review  of Systems  Musculoskeletal:  Positive for back pain and neck pain.    Physical Exam Vital Signs  I have reviewed the triage vital signs BP 120/67   Pulse 62   Temp 97.7 F (36.5 C) (Temporal)   Resp 16   Ht 5' 3 (1.6 m)   Wt 83 kg   SpO2 92%   BMI 32.42 kg/m   Physical Exam Vitals and nursing note reviewed.  Constitutional:      General: She is not in acute distress.    Appearance: She is well-developed.  HENT:     Head: Normocephalic.     Comments: Facial bruising Eyes:     Conjunctiva/sclera: Conjunctivae normal.  Cardiovascular:     Rate and Rhythm: Normal rate and regular rhythm.     Heart sounds: No murmur heard. Pulmonary:     Effort: Pulmonary effort is normal. No respiratory distress.     Breath sounds: Normal breath sounds.  Abdominal:     Palpations: Abdomen is soft.     Tenderness: There is no abdominal tenderness.  Musculoskeletal:        General: Tenderness present. No swelling.     Cervical back: Neck supple. Tenderness present.  Skin:    General: Skin is warm and dry.     Capillary Refill: Capillary refill takes less than 2 seconds.  Neurological:     Mental Status: She is alert.  Psychiatric:        Mood and Affect: Mood normal.     ED Results and Treatments Labs (all labs ordered are listed, but only abnormal results are displayed) Labs Reviewed  COMPREHENSIVE METABOLIC PANEL WITH GFR - Abnormal; Notable for the following components:      Result Value   Glucose, Bld 65 (*)    BUN 25 (*)    Creatinine, Ser 1.31 (*)    Total Protein 5.8 (*)    Albumin 3.1 (*)    AST 42 (*)    Alkaline Phosphatase 177 (*)    GFR, Estimated 42 (*)    All other components within normal limits  CBC WITH DIFFERENTIAL/PLATELET - Abnormal; Notable for the following components:   Abs Immature Granulocytes 0.10 (*)    All other components within normal limits  CBG MONITORING, ED - Abnormal; Notable for the following components:   Glucose-Capillary 60 (*)     All other components within normal limits  CBG MONITORING, ED - Abnormal; Notable for the following components:   Glucose-Capillary 154 (*)    All other components within normal limits  TROPONIN I (HIGH SENSITIVITY)  TROPONIN I (HIGH SENSITIVITY)                                                                                                                          Radiology  CT Thoracic Spine Wo Contrast Result Date: 03/02/2024 CLINICAL DATA:  Low back pain, trauma; Spine fracture, thoracic, traumatic EXAM: CT THORACIC AND LUMBAR SPINE WITHOUT CONTRAST TECHNIQUE: Multidetector CT imaging of the thoracic and lumbar spine was performed without contrast. Multiplanar CT image reconstructions were also generated. RADIATION DOSE REDUCTION: This exam was performed according to the departmental dose-optimization program which includes automated exposure control, adjustment of the mA and/or kV according to patient size and/or use of iterative reconstruction technique. COMPARISON:  CT chest abdomen pelvis 02/26/2024. FINDINGS: CT THORACIC SPINE FINDINGS Alignment: Normal. Vertebrae: Multilevel continuous anterior osteophyte formation. No acute fracture or focal pathologic process. Paraspinal and other soft tissues: Negative. Disc levels: Multilevel intervertebral disc space vacuum phenomenon. CT LUMBAR SPINE FINDINGS Segmentation: 5 lumbar type vertebrae. Alignment: Normal. Vertebrae: Multilevel moderate to severe degenerative change of the spine. Posterior disc osteophyte complex formation in the L2-L3 and L5-S1 levels. Moderate to severe osseous central canal stenosis at the L2-L3 level. No acute fracture or focal pathologic process. Paraspinal and other soft tissues: Negative. Disc levels: Multilevel intervertebral disc space vacuum phenomenon. Other: 7 x 4 mm subpleural left lower lobe pulmonary nodule (5:69). Right upper quadrant surgical clips. Severe atherosclerotic plaque. Colonic diverticulosis. Question  slight pericolonic fat stranding of an under distended sigmoid colon (6:147). IMPRESSION: CT THORACIC SPINE IMPRESSION 1. No acute displaced fracture or traumatic listhesis of the thoracic spine. CT LUMBAR SPINE IMPRESSION 1. No acute displaced fracture or traumatic listhesis of the lumbar spine. 2. Posterior disc osteophyte complex formation in the L2-L3 and L5-S1 levels. Moderate to severe osseous central canal stenosis at the L2-L3 level. Other imaging findings of potential clinical significance: 1. Question developing/mild sigmoid diverticulitis. Correlate clinically. 2. Left solid pulmonary nodule measuring 6 mm. Per Fleischner Society Guidelines, recommend a non-contrast Chest CT at 6-12 months. If patient is high risk for malignancy, consider an additional non-contrast Chest CT at 18-24 months. If patient is low risk for malignancy, non-contrast Chest CT at 18-24 months is optional. These guidelines do not apply to immunocompromised patients and patients with cancer. Follow up in patients with significant comorbidities as clinically warranted. For lung cancer screening, adhere to Lung-RADS guidelines. Reference: Radiology. 2017; 284(1):228-43. Electronically Signed   By: Morgane  Naveau M.D.   On: 03/02/2024 18:24   CT Lumbar Spine Wo Contrast Result Date: 03/02/2024 CLINICAL DATA:  Low back pain, trauma; Spine fracture, thoracic, traumatic EXAM: CT THORACIC AND LUMBAR SPINE WITHOUT CONTRAST TECHNIQUE: Multidetector CT imaging of the thoracic and lumbar spine was performed without contrast. Multiplanar CT image reconstructions were also generated. RADIATION DOSE REDUCTION: This exam was performed according to the departmental dose-optimization program which includes automated exposure control, adjustment of the mA and/or kV according to patient size and/or use of iterative reconstruction technique. COMPARISON:  CT chest abdomen pelvis 02/26/2024. FINDINGS: CT THORACIC SPINE FINDINGS Alignment: Normal.  Vertebrae: Multilevel continuous anterior osteophyte formation. No acute fracture or focal pathologic process. Paraspinal and other soft tissues: Negative. Disc levels: Multilevel intervertebral disc space vacuum phenomenon. CT LUMBAR SPINE FINDINGS Segmentation: 5 lumbar type vertebrae. Alignment: Normal. Vertebrae: Multilevel moderate to severe degenerative change of the spine. Posterior disc osteophyte complex formation in the L2-L3 and L5-S1 levels. Moderate to severe osseous central canal stenosis at the L2-L3 level. No acute fracture or focal pathologic process. Paraspinal and other soft tissues: Negative. Disc levels: Multilevel intervertebral disc space vacuum phenomenon. Other: 7 x 4 mm subpleural left lower lobe pulmonary nodule (5:69). Right upper quadrant surgical clips. Severe atherosclerotic  plaque. Colonic diverticulosis. Question slight pericolonic fat stranding of an under distended sigmoid colon (6:147). IMPRESSION: CT THORACIC SPINE IMPRESSION 1. No acute displaced fracture or traumatic listhesis of the thoracic spine. CT LUMBAR SPINE IMPRESSION 1. No acute displaced fracture or traumatic listhesis of the lumbar spine. 2. Posterior disc osteophyte complex formation in the L2-L3 and L5-S1 levels. Moderate to severe osseous central canal stenosis at the L2-L3 level. Other imaging findings of potential clinical significance: 1. Question developing/mild sigmoid diverticulitis. Correlate clinically. 2. Left solid pulmonary nodule measuring 6 mm. Per Fleischner Society Guidelines, recommend a non-contrast Chest CT at 6-12 months. If patient is high risk for malignancy, consider an additional non-contrast Chest CT at 18-24 months. If patient is low risk for malignancy, non-contrast Chest CT at 18-24 months is optional. These guidelines do not apply to immunocompromised patients and patients with cancer. Follow up in patients with significant comorbidities as clinically warranted. For lung cancer  screening, adhere to Lung-RADS guidelines. Reference: Radiology. 2017; 284(1):228-43. Electronically Signed   By: Morgane  Naveau M.D.   On: 03/02/2024 18:24   CT HEAD WO CONTRAST ( ) Result Date: 03/02/2024 CLINICAL DATA:  Head trauma, minor (Age >= 65y); Neck trauma (Age >= 65y) EXAM: CT HEAD WITHOUT CONTRAST CT CERVICAL SPINE WITHOUT CONTRAST TECHNIQUE: Multidetector CT imaging of the head and cervical spine was performed following the standard protocol without intravenous contrast. Multiplanar CT image reconstructions of the cervical spine were also generated. RADIATION DOSE REDUCTION: This exam was performed according to the departmental dose-optimization program which includes automated exposure control, adjustment of the mA and/or kV according to patient size and/or use of iterative reconstruction technique. COMPARISON:  CT head and C-spine 12/05/2023 FINDINGS: CT HEAD FINDINGS Brain: No evidence of large-territorial acute infarction. No parenchymal hemorrhage. No mass lesion. No extra-axial collection. No mass effect or midline shift. No hydrocephalus. Basilar cisterns are patent. Vascular: No hyperdense vessel. Atherosclerotic calcifications are present within the cavernous internal carotid and vertebral arteries. Skull: No acute fracture or focal lesion. Sinuses/Orbits: Paranasal sinuses and mastoid air cells are clear. The orbits are unremarkable. Other: Left frontal scalp subcutaneus soft tissue emphysema in trace hematoma. CT CERVICAL SPINE FINDINGS Alignment: Normal. Skull base and vertebrae: No acute fracture. No aggressive appearing focal osseous lesion or focal pathologic process. Soft tissues and spinal canal: No prevertebral fluid or swelling. No visible canal hematoma. Upper chest: Unremarkable. Other: Atherosclerotic plaque of the aortic arch and its main branches. IMPRESSION: 1.  No acute intracranial abnormality. 2. No acute displaced fracture or traumatic listhesis of the cervical spine.  3.  Aortic Atherosclerosis (ICD10-I70.0). Electronically Signed   By: Morgane  Naveau M.D.   On: 03/02/2024 18:16   CT CERVICAL SPINE WO CONTRAST Result Date: 03/02/2024 CLINICAL DATA:  Head trauma, minor (Age >= 65y); Neck trauma (Age >= 65y) EXAM: CT HEAD WITHOUT CONTRAST CT CERVICAL SPINE WITHOUT CONTRAST TECHNIQUE: Multidetector CT imaging of the head and cervical spine was performed following the standard protocol without intravenous contrast. Multiplanar CT image reconstructions of the cervical spine were also generated. RADIATION DOSE REDUCTION: This exam was performed according to the departmental dose-optimization program which includes automated exposure control, adjustment of the mA and/or kV according to patient size and/or use of iterative reconstruction technique. COMPARISON:  CT head and C-spine 12/05/2023 FINDINGS: CT HEAD FINDINGS Brain: No evidence of large-territorial acute infarction. No parenchymal hemorrhage. No mass lesion. No extra-axial collection. No mass effect or midline shift. No hydrocephalus. Basilar cisterns are patent. Vascular: No hyperdense vessel.  Atherosclerotic calcifications are present within the cavernous internal carotid and vertebral arteries. Skull: No acute fracture or focal lesion. Sinuses/Orbits: Paranasal sinuses and mastoid air cells are clear. The orbits are unremarkable. Other: Left frontal scalp subcutaneus soft tissue emphysema in trace hematoma. CT CERVICAL SPINE FINDINGS Alignment: Normal. Skull base and vertebrae: No acute fracture. No aggressive appearing focal osseous lesion or focal pathologic process. Soft tissues and spinal canal: No prevertebral fluid or swelling. No visible canal hematoma. Upper chest: Unremarkable. Other: Atherosclerotic plaque of the aortic arch and its main branches. IMPRESSION: 1.  No acute intracranial abnormality. 2. No acute displaced fracture or traumatic listhesis of the cervical spine. 3.  Aortic Atherosclerosis  (ICD10-I70.0). Electronically Signed   By: Morgane  Naveau M.D.   On: 03/02/2024 18:16   DG Pelvis Portable Result Date: 03/02/2024 CLINICAL DATA:  Dizziness resulting in a fall. EXAM: PORTABLE PELVIS 1-2 VIEWS COMPARISON:  Chest, abdomen and pelvis CT dated 02/26/2024. FINDINGS: No fracture or dislocation. Lower lumbar spine degenerative changes. Diffuse osteopenia. IMPRESSION: No fracture. Electronically Signed   By: Elspeth Bathe M.D.   On: 03/02/2024 18:00   DG Chest Portable 1 View Result Date: 03/02/2024 CLINICAL DATA:  Dizziness resulting in a fall. EXAM: PORTABLE CHEST 1 VIEW COMPARISON:  05/17/2020 FINDINGS: Poor inspiration. Normal-sized heart. Tortuous and partially calcified thoracic aorta. Clear lungs with normal vascularity. No fracture or pneumothorax. Thoracic spine degenerative changes. Cholecystectomy clips. IMPRESSION: No acute abnormality. Electronically Signed   By: Elspeth Bathe M.D.   On: 03/02/2024 17:58    Pertinent labs & imaging results that were available during my care of the patient were reviewed by me and considered in my medical decision making (see MDM for details).  Medications Ordered in ED Medications  diphenhydrAMINE  (BENADRYL ) capsule 50 mg (has no administration in time range)    Or  diphenhydrAMINE  (BENADRYL ) injection 50 mg (has no administration in time range)  dextrose  50 % solution 50 mL (50 mLs Intravenous Given 03/02/24 1922)  lactated ringers  bolus 1,000 mL (0 mLs Intravenous Stopped 03/02/24 2226)  methylPREDNISolone  sodium succinate (SOLU-MEDROL ) 40 mg/mL injection 40 mg (40 mg Intravenous Given 03/02/24 2005)  HYDROcodone -acetaminophen  (NORCO/VICODIN) 5-325 MG per tablet 1 tablet (1 tablet Oral Given 03/02/24 2202)                                                                                                                                     Procedures Procedures  (including critical care time)  Medical Decision Making / ED Course   This  patient presents to the ED for concern of fall, this involves an extensive number of treatment options, and is a complaint that carries with it a high risk of complications and morbidity.  The differential diagnosis includes orthostatic syncope, cardiogenic syncope, vasovagal syncope, electrolyte abnormality, dehydration, dysrhythmia, vasovagal, Hypoglycemia, Seizure, Autonomic Insufficiency, PE  MDM: Patient seen emerged part for evaluation of a fall on blood  thinners.  Arrives as a level 2 trauma and primary survey is unremarkable.  Secondary survey with C-spine and T-spine tenderness but is otherwise unremarkable.  Laboratory evaluation with a glucose of 65 and patient received 1 amp of D50, BUN 25, creatinine 1.31, albumin 3.1, high-sensitivity troponin is normal.  Trauma imaging including chest x-ray, pelvis x-ray, CT head, C-spine, T-spine, L-spine reassuringly negative for acute traumatic injury.  On reevaluation, patient states that she is feeling okay but I do have concerns about the patient's recurrent syncopal episodes with known large almost saddle PE.  Plan at time of signout will be for CT PE and reevaluation after assessing size of the PE.  If PE is the same size or greater would consider hospital admission for thrombectomy given persistent syncopal episodes.  Please see provider's signout note for continuation of workup.   Additional history obtained: -Additional history obtained from multiple family numbers -External records from outside source obtained and reviewed including: Chart review including previous notes, labs, imaging, consultation notes   Lab Tests: -I ordered, reviewed, and interpreted labs.   The pertinent results include:   Labs Reviewed  COMPREHENSIVE METABOLIC PANEL WITH GFR - Abnormal; Notable for the following components:      Result Value   Glucose, Bld 65 (*)    BUN 25 (*)    Creatinine, Ser 1.31 (*)    Total Protein 5.8 (*)    Albumin 3.1 (*)    AST 42 (*)     Alkaline Phosphatase 177 (*)    GFR, Estimated 42 (*)    All other components within normal limits  CBC WITH DIFFERENTIAL/PLATELET - Abnormal; Notable for the following components:   Abs Immature Granulocytes 0.10 (*)    All other components within normal limits  CBG MONITORING, ED - Abnormal; Notable for the following components:   Glucose-Capillary 60 (*)    All other components within normal limits  CBG MONITORING, ED - Abnormal; Notable for the following components:   Glucose-Capillary 154 (*)    All other components within normal limits  TROPONIN I (HIGH SENSITIVITY)  TROPONIN I (HIGH SENSITIVITY)      EKG   EKG Interpretation Date/Time:  Tuesday March 02 2024 17:36:25 EDT Ventricular Rate:  60 PR Interval:  177 QRS Duration:  83 QT Interval:  537 QTC Calculation: 537 R Axis:   -9  Text Interpretation: Sinus rhythm Prolonged QT interval Confirmed by Abhiram Criado ([id number]) on 03/02/2024 10:57:18 PM         Imaging Studies ordered: I ordered imaging studies including CT head, C-spine, T-spine, L-spine I independently visualized and interpreted imaging. I agree with the radiologist interpretation  PE study pending   Medicines ordered and prescription drug management: Meds ordered this encounter  Medications   dextrose  50 % solution 50 mL   lactated ringers  bolus 1,000 mL   methylPREDNISolone  sodium succinate (SOLU-MEDROL ) 40 mg/mL injection 40 mg    IV methylprednisolone  will be converted to either a q12h or q24h frequency with the same total daily dose (TDD).  Ordered Dose: 1 to 125 mg TDD; convert to: TDD q24h.  Ordered Dose: 126 to 250 mg TDD; convert to: TDD div q12h.  Ordered Dose: >250 mg TDD; DAW.   OR Linked Order Group    diphenhydrAMINE  (BENADRYL ) capsule 50 mg    diphenhydrAMINE  (BENADRYL ) injection 50 mg   HYDROcodone -acetaminophen  (NORCO/VICODIN) 5-325 MG per tablet 1 tablet    Refill:  0    -I have reviewed the patients home  medicines and  have made adjustments as needed  Critical interventions none   Cardiac Monitoring: The patient was maintained on a cardiac monitor.  I personally viewed and interpreted the cardiac monitored which showed an underlying rhythm of: NSR  Social Determinants of Health:  Factors impacting patients care include: none   Reevaluation: After the interventions noted above, I reevaluated the patient and found that they have :stayed the same  Co morbidities that complicate the patient evaluation  Past Medical History:  Diagnosis Date   Allergic rhinitis, cause unspecified    Anxiety    Arthritis    hands, feet and back   Asthma    Benign neoplasm of colon    Bursitis    hips   CHF (congestive heart failure) (HCC)    Cirrhosis (HCC)    auto immune   Complication of anesthesia     i WAKE UP WITH TREMORS  after shoulder surgery tremors for 10 months after surgery surgery- 2009- surgical center in [place]   Cyst and pseudocyst of pancreas    Depressive disorder, not elsewhere classified    Diabetes mellitus without complication (HCC)    Diverticulitis    Diverticulosis of colon (without mention of hemorrhage)    Dyspnea    Dysrhythmia    arrhythmia   Esophageal reflux    Essential and other specified forms of tremor    History of GI bleed    History of kidney stones    Irritable bowel syndrome    Obesity, unspecified    Other specified cardiac dysrhythmias(427.89)    Periapical abscess without sinus    Polyneuropathy in diabetes(357.2)    PONV (postoperative nausea and vomiting)    Pure hypercholesterolemia    Renal cell carcinoma (HCC)    Sleep apnea    CPAP- non compliant   Type II or unspecified type diabetes mellitus with neurological manifestations, not stated as uncontrolled(250.60)       Dispostion: I considered admission for this patient, and disposition pending completion of imaging studies.  Please see provider signout of continuation of  workup.     Final Clinical Impression(s) / ED Diagnoses Final diagnoses:  None     @PCDICTATION @    Albertina Dixon, MD 03/02/24 2257

## 2024-03-02 NOTE — ED Notes (Signed)
 Trauma Response Nurse Documentation   [name] [name] is a 78 y.o. female arriving to [facility] via [facility]  On Eliquis  (apixaban ) daily. Trauma was activated as a Level 2 by charge RN based on the following trauma criteria Elderly patients > 65 with head trauma on anti-coagulation (excluding ASA).  Patient cleared for CT by Dr. Delwyn. Pt transported to CT with trauma response nurse present to monitor. RN remained with the patient throughout their absence from the department for clinical observation.   GCS 15.  History   Past Medical History:  Diagnosis Date   Allergic rhinitis, cause unspecified    Anxiety    Arthritis    hands, feet and back   Asthma    Benign neoplasm of colon    Bursitis    hips   CHF (congestive heart failure) (HCC)    Cirrhosis (HCC)    auto immune   Complication of anesthesia     i WAKE UP WITH TREMORS  after shoulder surgery tremors for 10 months after surgery surgery- 2009- surgical center in [place]   Cyst and pseudocyst of pancreas    Depressive disorder, not elsewhere classified    Diabetes mellitus without complication (HCC)    Diverticulitis    Diverticulosis of colon (without mention of hemorrhage)    Dyspnea    Dysrhythmia    arrhythmia   Esophageal reflux    Essential and other specified forms of tremor    History of GI bleed    History of kidney stones    Irritable bowel syndrome    Obesity, unspecified    Other specified cardiac dysrhythmias(427.89)    Periapical abscess without sinus    Polyneuropathy in diabetes(357.2)    PONV (postoperative nausea and vomiting)    Pure hypercholesterolemia    Renal cell carcinoma (HCC)    Sleep apnea    CPAP- non compliant   Type II or unspecified type diabetes mellitus with neurological manifestations, not stated as uncontrolled(250.60)      Past Surgical History:  Procedure Laterality Date   ABDOMINAL HYSTERECTOMY     CALCANEAL OSTEOTOMY Right 07/27/2015   Procedure:  RIGHT CALCANEAL OSTEOTOMY ;  Surgeon: Norleen Armor, MD;  Location: [facility];  Service: Orthopedics;  Laterality: Right;   CHOLECYSTECTOMY     15-20 years ago   COLONOSCOPY WITH PROPOFOL  N/A 08/17/2019   Procedure: COLONOSCOPY WITH PROPOFOL ;  Surgeon: Rollin Dover, MD;  Location: [facility] ENDOSCOPY;  Service: Endoscopy;  Laterality: N/A;   COLONOSCOPY WITH PROPOFOL  N/A 06/21/2022   Procedure: COLONOSCOPY WITH PROPOFOL ;  Surgeon: Rollin Dover, MD;  Location: [facility] END[facility]COPY;  Service: Gastroenterology;  Laterality: N/A;   ESOPHAGOGASTRODUODENOSCOPY (EGD) WITH PROPOFOL  N/A 08/17/2019   Procedure: ESOPHAGOGASTRODUODENOSCOPY (EGD) WITH PROPOFOL ;  Surgeon: Rollin Dover, MD;  Location: [facility] ENDOSCOPY;  Service: Endoscopy;  Laterality: N/A;   GASTROCNEMIUS RECESSION Right 07/27/2015   Procedure: RIGHT GASTROC RECESSION;  Surgeon: Norleen Armor, MD;  Location: [facility];  Service: Orthopedics;  Laterality: Right;   KIDNEY STONE SURGERY  1980's   removal   LITHOTRIPSY     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   PARTIAL HYSTERECTOMY  1990's   POLYPECTOMY  08/17/2019   Procedure: POLYPECTOMY;  Surgeon: Rollin Dover, MD;  Location: [facility] ENDOSCOPY;  Service: Endoscopy;;   POLYPECTOMY  06/21/2022   Procedure: POLYPECTOMY;  Surgeon: Rollin Dover, MD;  Location: [facility] ENDOSCOPY;  Service: Gastroenterology;;   ROBOT ASSISTED LAPAROSCOPIC NEPHRECTOMY Left 08/14/2022   Procedure: XI ROBOTIC ASSISTED LAPAROSCOPIC NEPHRECTOMY;  Surgeon: Devere Lonni Righter, MD;  Location: [facility];  Service: Urology;  Laterality: Left;   ROTATOR CUFF REPAIR  2005   XI ROBOTIC LAPAROSCOPIC ASSISTED APPENDECTOMY N/A 08/14/2022   Procedure: XI ROBOTIC LAPAROSCOPIC ASSISTED APPENDECTOMY;  Surgeon: Debby Hila, MD;  Location: [facility];  Service: General;  Laterality: N/A;       Initial Focused Assessment (If applicable, or please see trauma documentation): Airway - clear Breathing - unlabored Circulation- small cuts to left side of  face/nose-  GCS -15  CT's Completed:   CT Head and CT C-Spine, CTA chest  Interventions:  Labs Xrays CT scans   Plan for disposition:  Admission to ICU after CTA pulmonary embolism  Event Summary: Pt fell this afternoon on front porch- recently started on Eliquis  for pulmonary embolism- saddle type.  Swelling noted to upper lip, speech sounded a little slurred, but neuro exam without deficits- equal grips, no drift, does state that she took a hydrocodone  tablet this morning at 8am   See EDP and admitting MD notes for further assessment  [name]  Trauma Response RN  Please call TRN at [phone] for further assistance.

## 2024-03-02 NOTE — ED Triage Notes (Addendum)
 Pt bib ems for FOT, pt takes 10 mg of eliquis  for bilateral PE, last taken this am. Pt felt dizzy when standing and fell face first onto cement porch. Pt has lac to L eye brow, nose and skin tear to left arm. Deis neck pain and loc. C/o low mid back pain and tenderness to both hips. A&Ox4

## 2024-03-03 ENCOUNTER — Inpatient Hospital Stay (HOSPITAL_COMMUNITY)

## 2024-03-03 ENCOUNTER — Emergency Department (HOSPITAL_COMMUNITY)

## 2024-03-03 ENCOUNTER — Encounter (HOSPITAL_COMMUNITY): Payer: Self-pay

## 2024-03-03 DIAGNOSIS — Z043 Encounter for examination and observation following other accident: Secondary | ICD-10-CM | POA: Diagnosis not present

## 2024-03-03 DIAGNOSIS — I509 Heart failure, unspecified: Secondary | ICD-10-CM | POA: Diagnosis not present

## 2024-03-03 DIAGNOSIS — I700 Atherosclerosis of aorta: Secondary | ICD-10-CM | POA: Diagnosis not present

## 2024-03-03 DIAGNOSIS — Z905 Acquired absence of kidney: Secondary | ICD-10-CM | POA: Diagnosis not present

## 2024-03-03 DIAGNOSIS — E7800 Pure hypercholesterolemia, unspecified: Secondary | ICD-10-CM | POA: Diagnosis present

## 2024-03-03 DIAGNOSIS — I480 Paroxysmal atrial fibrillation: Secondary | ICD-10-CM | POA: Diagnosis not present

## 2024-03-03 DIAGNOSIS — E1142 Type 2 diabetes mellitus with diabetic polyneuropathy: Secondary | ICD-10-CM | POA: Diagnosis present

## 2024-03-03 DIAGNOSIS — E119 Type 2 diabetes mellitus without complications: Secondary | ICD-10-CM | POA: Diagnosis not present

## 2024-03-03 DIAGNOSIS — K754 Autoimmune hepatitis: Secondary | ICD-10-CM | POA: Diagnosis present

## 2024-03-03 DIAGNOSIS — E1065 Type 1 diabetes mellitus with hyperglycemia: Secondary | ICD-10-CM | POA: Diagnosis not present

## 2024-03-03 DIAGNOSIS — N390 Urinary tract infection, site not specified: Secondary | ICD-10-CM | POA: Diagnosis present

## 2024-03-03 DIAGNOSIS — J984 Other disorders of lung: Secondary | ICD-10-CM | POA: Diagnosis not present

## 2024-03-03 DIAGNOSIS — I2693 Single subsegmental pulmonary embolism without acute cor pulmonale: Secondary | ICD-10-CM

## 2024-03-03 DIAGNOSIS — I2602 Saddle embolus of pulmonary artery with acute cor pulmonale: Secondary | ICD-10-CM | POA: Diagnosis not present

## 2024-03-03 DIAGNOSIS — I82441 Acute embolism and thrombosis of right tibial vein: Secondary | ICD-10-CM | POA: Diagnosis present

## 2024-03-03 DIAGNOSIS — Z79899 Other long term (current) drug therapy: Secondary | ICD-10-CM | POA: Diagnosis not present

## 2024-03-03 DIAGNOSIS — R55 Syncope and collapse: Secondary | ICD-10-CM | POA: Diagnosis not present

## 2024-03-03 DIAGNOSIS — K862 Cyst of pancreas: Secondary | ICD-10-CM | POA: Diagnosis present

## 2024-03-03 DIAGNOSIS — Z86711 Personal history of pulmonary embolism: Secondary | ICD-10-CM | POA: Diagnosis not present

## 2024-03-03 DIAGNOSIS — I951 Orthostatic hypotension: Secondary | ICD-10-CM | POA: Diagnosis present

## 2024-03-03 DIAGNOSIS — E1122 Type 2 diabetes mellitus with diabetic chronic kidney disease: Secondary | ICD-10-CM | POA: Diagnosis present

## 2024-03-03 DIAGNOSIS — R531 Weakness: Secondary | ICD-10-CM

## 2024-03-03 DIAGNOSIS — K7460 Unspecified cirrhosis of liver: Secondary | ICD-10-CM | POA: Diagnosis present

## 2024-03-03 DIAGNOSIS — J439 Emphysema, unspecified: Secondary | ICD-10-CM | POA: Diagnosis present

## 2024-03-03 DIAGNOSIS — R918 Other nonspecific abnormal finding of lung field: Secondary | ICD-10-CM | POA: Diagnosis not present

## 2024-03-03 DIAGNOSIS — Z6832 Body mass index (BMI) 32.0-32.9, adult: Secondary | ICD-10-CM | POA: Diagnosis not present

## 2024-03-03 DIAGNOSIS — I5032 Chronic diastolic (congestive) heart failure: Secondary | ICD-10-CM | POA: Diagnosis present

## 2024-03-03 DIAGNOSIS — B961 Klebsiella pneumoniae [K. pneumoniae] as the cause of diseases classified elsewhere: Secondary | ICD-10-CM | POA: Diagnosis present

## 2024-03-03 DIAGNOSIS — R4182 Altered mental status, unspecified: Secondary | ICD-10-CM | POA: Diagnosis not present

## 2024-03-03 DIAGNOSIS — G9341 Metabolic encephalopathy: Secondary | ICD-10-CM | POA: Diagnosis not present

## 2024-03-03 DIAGNOSIS — Z7901 Long term (current) use of anticoagulants: Secondary | ICD-10-CM | POA: Diagnosis not present

## 2024-03-03 DIAGNOSIS — N1831 Chronic kidney disease, stage 3a: Secondary | ICD-10-CM | POA: Diagnosis present

## 2024-03-03 DIAGNOSIS — I82412 Acute embolism and thrombosis of left femoral vein: Secondary | ICD-10-CM | POA: Diagnosis present

## 2024-03-03 DIAGNOSIS — W1839XA Other fall on same level, initial encounter: Secondary | ICD-10-CM | POA: Diagnosis present

## 2024-03-03 DIAGNOSIS — Z794 Long term (current) use of insulin: Secondary | ICD-10-CM | POA: Diagnosis not present

## 2024-03-03 DIAGNOSIS — Y92008 Other place in unspecified non-institutional (private) residence as the place of occurrence of the external cause: Secondary | ICD-10-CM | POA: Diagnosis not present

## 2024-03-03 DIAGNOSIS — I2692 Saddle embolus of pulmonary artery without acute cor pulmonale: Secondary | ICD-10-CM | POA: Diagnosis present

## 2024-03-03 DIAGNOSIS — I130 Hypertensive heart and chronic kidney disease with heart failure and stage 1 through stage 4 chronic kidney disease, or unspecified chronic kidney disease: Secondary | ICD-10-CM | POA: Diagnosis present

## 2024-03-03 DIAGNOSIS — K219 Gastro-esophageal reflux disease without esophagitis: Secondary | ICD-10-CM | POA: Diagnosis present

## 2024-03-03 DIAGNOSIS — I2699 Other pulmonary embolism without acute cor pulmonale: Principal | ICD-10-CM | POA: Diagnosis present

## 2024-03-03 DIAGNOSIS — Z833 Family history of diabetes mellitus: Secondary | ICD-10-CM | POA: Diagnosis not present

## 2024-03-03 HISTORY — PX: IR THROMBECT PRIM MECH INIT (INCLU) MOD SED: IMG2297

## 2024-03-03 HISTORY — PX: IR ANGIOGRAM PULMONARY BILATERAL SELECTIVE: IMG664

## 2024-03-03 HISTORY — PX: IR IVC FILTER PLMT / S&I /IMG GUID/MOD SED: IMG701

## 2024-03-03 HISTORY — PX: IR US GUIDE VASC ACCESS RIGHT: IMG2390

## 2024-03-03 HISTORY — PX: IR ANGIOGRAM SELECTIVE EACH ADDITIONAL VESSEL: IMG667

## 2024-03-03 HISTORY — PX: IR THROMBECT PRIM MECH ADD (INCLU) MOD SED: IMG2298

## 2024-03-03 LAB — CBC
HCT: 42 % (ref 36.0–46.0)
Hemoglobin: 14 g/dL (ref 12.0–15.0)
MCH: 30.4 pg (ref 26.0–34.0)
MCHC: 33.3 g/dL (ref 30.0–36.0)
MCV: 91.1 fL (ref 80.0–100.0)
Platelets: 142 K/uL — ABNORMAL LOW (ref 150–400)
RBC: 4.61 MIL/uL (ref 3.87–5.11)
RDW: 13.4 % (ref 11.5–15.5)
WBC: 6.4 K/uL (ref 4.0–10.5)
nRBC: 0 % (ref 0.0–0.2)

## 2024-03-03 LAB — GLUCOSE, CAPILLARY
Glucose-Capillary: 186 mg/dL — ABNORMAL HIGH (ref 70–99)
Glucose-Capillary: 206 mg/dL — ABNORMAL HIGH (ref 70–99)
Glucose-Capillary: 179 mg/dL — ABNORMAL HIGH (ref 70–99)
Glucose-Capillary: 212 mg/dL — ABNORMAL HIGH (ref 70–99)
Glucose-Capillary: 219 mg/dL — ABNORMAL HIGH (ref 70–99)
Glucose-Capillary: 365 mg/dL — ABNORMAL HIGH (ref 70–99)

## 2024-03-03 LAB — BASIC METABOLIC PANEL WITH GFR
Anion gap: 11 (ref 5–15)
BUN: 23 mg/dL (ref 8–23)
CO2: 26 mmol/L (ref 22–32)
Calcium: 8.8 mg/dL — ABNORMAL LOW (ref 8.9–10.3)
Chloride: 101 mmol/L (ref 98–111)
Creatinine, Ser: 1.26 mg/dL — ABNORMAL HIGH (ref 0.44–1.00)
GFR, Estimated: 44 mL/min — ABNORMAL LOW (ref >60)
Glucose, Bld: 225 mg/dL — ABNORMAL HIGH (ref 70–99)
Potassium: 4.4 mmol/L (ref 3.5–5.1)
Sodium: 138 mmol/L (ref 135–145)

## 2024-03-03 LAB — BRAIN NATRIURETIC PEPTIDE: B Natriuretic Peptide: 54.6 pg/mL (ref 0.0–100.0)

## 2024-03-03 LAB — PHOSPHORUS: Phosphorus: 4.3 mg/dL (ref 2.5–4.6)

## 2024-03-03 LAB — MRSA NEXT GEN BY PCR, NASAL: MRSA by PCR Next Gen: NOT DETECTED

## 2024-03-03 LAB — APTT: aPTT: >200 s (ref 24–36)

## 2024-03-03 LAB — HEMOGLOBIN A1C
Hgb A1c MFr Bld: 8.1 % — ABNORMAL HIGH (ref 4.8–5.6)
Mean Plasma Glucose: 186 mg/dL

## 2024-03-03 LAB — HEPARIN LEVEL (UNFRACTIONATED): Heparin Unfractionated: >1.1 [IU]/mL — ABNORMAL HIGH (ref 0.30–0.70)

## 2024-03-03 LAB — MAGNESIUM: Magnesium: 1.5 mg/dL — ABNORMAL LOW (ref 1.7–2.4)

## 2024-03-03 MED ORDER — DOCUSATE SODIUM 100 MG PO CAPS: 100.0000 mg | ORAL_CAPSULE | Freq: Two times a day (BID) | ORAL | Status: DC | PRN

## 2024-03-03 MED ORDER — CHLORHEXIDINE GLUCONATE CLOTH 2 % EX PADS
6.0000 | MEDICATED_PAD | Freq: Every day | CUTANEOUS | Status: DC
  Administered 2024-03-03 – 2024-03-07 (×8): 6 via TOPICAL

## 2024-03-03 MED ORDER — HEPARIN SODIUM (PORCINE) 1000 UNIT/ML IJ SOLN
INTRAMUSCULAR | Status: AC
  Filled 2024-03-03: qty 10

## 2024-03-03 MED ORDER — ACETAMINOPHEN 325 MG PO TABS: 650.0000 mg | ORAL_TABLET | Freq: Four times a day (QID) | ORAL | Status: DC | PRN

## 2024-03-03 MED ORDER — FENTANYL CITRATE (PF) 100 MCG/2ML IJ SOLN
INTRAMUSCULAR | Status: AC | PRN
  Administered 2024-03-03 (×2): 25 ug via INTRAVENOUS

## 2024-03-03 MED ORDER — DIPHENHYDRAMINE HCL 25 MG PO CAPS: 50.0000 mg | ORAL_CAPSULE | ORAL | Status: AC

## 2024-03-03 MED ORDER — IOHEXOL 350 MG/ML SOLN
150.0000 mL | Freq: Once | INTRAVENOUS | Status: AC | PRN
  Administered 2024-03-03 (×2): 40 mL via INTRA_ARTERIAL

## 2024-03-03 MED ORDER — MAGNESIUM SULFATE 2 GM/50ML IV SOLN
2.0000 g | Freq: Once | INTRAVENOUS | Status: AC
  Administered 2024-03-03 (×2): 2 g via INTRAVENOUS
  Filled 2024-03-03: qty 50

## 2024-03-03 MED ORDER — DIPHENHYDRAMINE HCL 50 MG/ML IJ SOLN: 50.0000 mg | Freq: Once | INTRAMUSCULAR | Status: DC

## 2024-03-03 MED ORDER — MIDAZOLAM HCL 2 MG/2ML IJ SOLN
INTRAMUSCULAR | Status: AC | PRN
Start: 2024-03-03 — End: 2024-03-03
  Administered 2024-03-03 (×2): .5 mg via INTRAVENOUS
  Administered 2024-03-03 (×2): 1 mg via INTRAVENOUS

## 2024-03-03 MED ORDER — TETRACAINE HCL 1 % IJ SOLN
100.0000 mg | Freq: Once | INTRAMUSCULAR | Status: AC
  Administered 2024-03-03 (×2): 10 mL
  Filled 2024-03-03: qty 10

## 2024-03-03 MED ORDER — SODIUM CHLORIDE 0.9 % IV SOLN
12.5000 mg | Freq: Three times a day (TID) | INTRAVENOUS | Status: DC | PRN
  Administered 2024-03-03 – 2024-03-04 (×3): 12.5 mg via INTRAVENOUS
  Filled 2024-03-03: qty 12.5
  Filled 2024-03-03 (×2): qty 0.5

## 2024-03-03 MED ORDER — PANTOPRAZOLE SODIUM 40 MG PO TBEC
40.0000 mg | DELAYED_RELEASE_TABLET | Freq: Every day | ORAL | Status: DC
  Administered 2024-03-03 – 2024-03-08 (×7): 40 mg via ORAL
  Filled 2024-03-03 (×6): qty 1

## 2024-03-03 MED ORDER — HEPARIN BOLUS VIA INFUSION
3000.0000 [IU] | Freq: Once | INTRAVENOUS | Status: AC
  Administered 2024-03-03 (×2): 3000 [IU] via INTRAVENOUS
  Filled 2024-03-03: qty 3000

## 2024-03-03 MED ORDER — TETRACAINE HCL 1 % IJ SOLN
INTRAMUSCULAR | Status: AC
Start: 2024-03-03 — End: 2024-03-03
  Filled 2024-03-03: qty 10

## 2024-03-03 MED ORDER — FAMOTIDINE IN NACL 20-0.9 MG/50ML-% IV SOLN
20.0000 mg | INTRAVENOUS | Status: DC
  Administered 2024-03-03 (×2): 20 mg via INTRAVENOUS
  Filled 2024-03-03: qty 50

## 2024-03-03 MED ORDER — DIPHENHYDRAMINE HCL 50 MG/ML IJ SOLN
50.0000 mg | INTRAMUSCULAR | Status: AC
  Administered 2024-03-03 (×2): 50 mg via INTRAVENOUS
  Filled 2024-03-03: qty 1

## 2024-03-03 MED ORDER — POLYETHYLENE GLYCOL 3350 17 G PO PACK: 17.0000 g | PACK | Freq: Every day | ORAL | Status: DC | PRN

## 2024-03-03 MED ORDER — METHYLPREDNISOLONE SODIUM SUCC 40 MG IJ SOLR: 40.0000 mg | Freq: Once | INTRAMUSCULAR | Status: DC

## 2024-03-03 MED ORDER — METHYLPREDNISOLONE SODIUM SUCC 40 MG IJ SOLR
40.0000 mg | INTRAMUSCULAR | Status: AC
  Administered 2024-03-03 (×2): 40 mg via INTRAVENOUS
  Filled 2024-03-03: qty 1

## 2024-03-03 MED ORDER — HEPARIN (PORCINE) 25000 UT/250ML-% IV SOLN: 1000.0000 [IU]/h | INTRAVENOUS | Status: DC

## 2024-03-03 MED ORDER — HEPARIN SODIUM (PORCINE) 1000 UNIT/ML IJ SOLN
INTRAMUSCULAR | Status: AC | PRN
  Administered 2024-03-03 (×2): 8000 [IU] via INTRAVENOUS

## 2024-03-03 MED ORDER — HYDROMORPHONE HCL 1 MG/ML IJ SOLN
1.0000 mg | INTRAMUSCULAR | Status: DC | PRN
  Administered 2024-03-04 (×2): 1 mg via INTRAVENOUS
  Filled 2024-03-03 (×2): qty 1

## 2024-03-03 MED ORDER — IOHEXOL 300 MG/ML  SOLN
150.0000 mL | Freq: Once | INTRAMUSCULAR | Status: AC | PRN
  Administered 2024-03-03 (×2): 30 mL via INTRA_ARTERIAL

## 2024-03-03 MED ORDER — HYDROCODONE-ACETAMINOPHEN 5-325 MG PO TABS
1.0000 | ORAL_TABLET | Freq: Four times a day (QID) | ORAL | Status: DC | PRN
  Administered 2024-03-04 – 2024-03-07 (×2): 1 via ORAL
  Filled 2024-03-03 (×3): qty 1

## 2024-03-03 MED ORDER — ONDANSETRON HCL 4 MG/2ML IJ SOLN
4.0000 mg | Freq: Once | INTRAMUSCULAR | Status: AC
  Administered 2024-03-03 (×2): 4 mg via INTRAVENOUS
  Filled 2024-03-03: qty 2

## 2024-03-03 MED ORDER — FENTANYL CITRATE (PF) 100 MCG/2ML IJ SOLN
INTRAMUSCULAR | Status: AC
  Filled 2024-03-03: qty 4

## 2024-03-03 MED ORDER — INSULIN ASPART 100 UNIT/ML IJ SOLN
0.0000 [IU] | INTRAMUSCULAR | Status: DC
  Administered 2024-03-03: 5 [IU] via SUBCUTANEOUS
  Administered 2024-03-03: 3 [IU] via SUBCUTANEOUS
  Administered 2024-03-03: 5 [IU] via SUBCUTANEOUS
  Administered 2024-03-03: 15 [IU] via SUBCUTANEOUS
  Administered 2024-03-03: 3 [IU] via SUBCUTANEOUS
  Administered 2024-03-03 (×3): 5 [IU] via SUBCUTANEOUS
  Administered 2024-03-03: 15 [IU] via SUBCUTANEOUS
  Administered 2024-03-03: 5 [IU] via SUBCUTANEOUS
  Administered 2024-03-04: 8 [IU] via SUBCUTANEOUS

## 2024-03-03 MED ORDER — MIDAZOLAM HCL 2 MG/2ML IJ SOLN
INTRAMUSCULAR | Status: AC
  Filled 2024-03-03: qty 4

## 2024-03-03 MED ORDER — HYDROMORPHONE HCL 1 MG/ML IJ SOLN
1.0000 mg | Freq: Once | INTRAMUSCULAR | Status: AC
  Administered 2024-03-03 (×2): 1 mg via INTRAVENOUS
  Filled 2024-03-03: qty 1

## 2024-03-03 MED ORDER — DIPHENHYDRAMINE HCL 25 MG PO CAPS: 50.0000 mg | ORAL_CAPSULE | Freq: Once | ORAL | Status: DC

## 2024-03-03 MED ORDER — LIDOCAINE-EPINEPHRINE 1 %-1:100000 IJ SOLN
INTRAMUSCULAR | Status: AC
  Filled 2024-03-03: qty 1

## 2024-03-03 MED ORDER — HEPARIN (PORCINE) 25000 UT/250ML-% IV SOLN
1250.0000 [IU]/h | INTRAVENOUS | Status: DC
  Administered 2024-03-03 (×2): 1250 [IU]/h via INTRAVENOUS
  Filled 2024-03-03: qty 250

## 2024-03-03 MED ORDER — IOHEXOL 350 MG/ML SOLN
75.0000 mL | Freq: Once | INTRAVENOUS | Status: AC | PRN
  Administered 2024-03-03 (×2): 75 mL via INTRAVENOUS

## 2024-03-03 NOTE — Sedation Documentation (Signed)
 Mean PAP: 20

## 2024-03-03 NOTE — Progress Notes (Signed)
 Interventional Radiology Brief Update  I was called to review this patient's case in regard to pulmonary embolism.  From report, chart, and imaging review, she is a 78 year old female with history of recent acute PE with saddle morphology (presented on 02/26/24 and treated with oral anticoagulation) who presents after a fall with CT evidence of propagation of thrombus into the right pulmonary artery.  Her ESC classification is currently intermediate-low risk with unchanged RV:LV of 1.3.  PESI score currently is 118 (class IV, high risk) however this is driven primarily by history of cancer (RCC status post nephrectomy) and history of CHF.  -IR will provide formal consultation in the morning - she may be a candidate for aspiration thrombectomy however no emergent needs at this time -agree with continuing anticoagulation -recommend obtaining BNP, echocardiogram, & bilateral lower extremity venous duplex  Ester Sides, MD Pager: [phone]

## 2024-03-03 NOTE — Sedation Documentation (Signed)
 PAP mean post 11

## 2024-03-03 NOTE — Progress Notes (Signed)
 Transition of Care [facility]) - CAGE-AID Screening   Patient Details  Name: [name] [name] MRN: [id number] Date of Birth: 1945/11/15  [name], RN Trauma Response Nurse Phone Number: [phone] 03/03/2024, 9:44 AM   CAGE-AID Screening:    Have You Ever Felt You Ought to Cut Down on Your Drinking or Drug Use?: No Have People Annoyed You By Critizing Your Drinking Or Drug Use?: No Have You Felt Bad Or Guilty About Your Drinking Or Drug Use?: No Have You Ever Had a Drink or Used Drugs First Thing In The Morning to Steady Your Nerves or to Get Rid of a Hangover?: No CAGE-AID Score: 0  Substance Abuse Education Offered: (S) No (denies any alcohol use- no services needed)

## 2024-03-03 NOTE — Consult Note (Signed)
 Chief Complaint: Acute pulmonary embolism. Request is for PE thrombectomy and IVC filter placement.  Referring Physician(s): Dr. Paula Southerly  Supervising Physician: Jennefer Rover  Patient Status: [name] - In-pt  History of Present Illness: [name] [name] is a 78 y.o. female inpatient. History of  renal cell carcinoma s/p nephrectomy with solitary kidney, DM, CHF, autoimmune hepatitis/cirrhosis, Recently admitted with PE.  Found to have bilateral DVT Presented to the ED at [facility] on 8.12.25 with injuries related to a fall.  CT Angio chest from 8.13.25 reads Previously noted saddle embolus has migrated distally into the right main pulmonary artery with near occlusion of the origin of the right upper lobar and right middle lobar pulmonary arteries and extension into the right lower lobar pulmonary artery. The right pulmonary artery appears mildly, progressively dilated measuring up to 2.9 cm in diameter proximal to the level of obstruction. CT evidence of right heart strain is unchanged (RV/LV- 1.3). Patient currently on a heparin  gtt. Request is for PE thrombectomy and IVC filter placement.  Allergies include contrast reaction anaphylaxis. Conformed with [facility] premedication is 40 mg of solumedrol and 50 mg of benadryl  to be given 1 hour prior to procedure. Team made aware.   Per daughter at bedside patient is at bedside states that patient has had multiple syncopal events that results in a fall that require admission to the hospital   History of prior venous thromboembolism? No Currently taking any anticoagulation? Eliquis  Any recent surgery or immobilization? No Any recent illness? No Recent travel with prolonged sitting? Yes. Per daughter patient has been sedentary due to back pain. Taking oral contraceptives/hormone supplementation? No Smoking history? No  Past Medical History:  Diagnosis Date   Allergic rhinitis, cause unspecified    Anxiety    Arthritis    hands, feet  and back   Asthma    Benign neoplasm of colon    Bursitis    hips   CHF (congestive heart failure) (HCC)    Cirrhosis (HCC)    auto immune   Complication of anesthesia     i WAKE UP WITH TREMORS  after shoulder surgery tremors for 10 months after surgery surgery- 2009- surgical center in [place]   Cyst and pseudocyst of pancreas    Depressive disorder, not elsewhere classified    Diabetes mellitus without complication (HCC)    Diverticulitis    Diverticulosis of colon (without mention of hemorrhage)    Dyspnea    Dysrhythmia    arrhythmia   Esophageal reflux    Essential and other specified forms of tremor    History of GI bleed    History of kidney stones    Irritable bowel syndrome    Obesity, unspecified    Other specified cardiac dysrhythmias(427.89)    Periapical abscess without sinus    Polyneuropathy in diabetes(357.2)    PONV (postoperative nausea and vomiting)    Pure hypercholesterolemia    Renal cell carcinoma (HCC)    Sleep apnea    CPAP- non compliant   Type II or unspecified type diabetes mellitus with neurological manifestations, not stated as uncontrolled(250.60)     Past Surgical History:  Procedure Laterality Date   ABDOMINAL HYSTERECTOMY     CALCANEAL OSTEOTOMY Right 07/27/2015   Procedure: RIGHT CALCANEAL OSTEOTOMY ;  Surgeon: Norleen Armor, MD;  Location: [facility];  Service: Orthopedics;  Laterality: Right;   CHOLECYSTECTOMY     15-20 years ago   COLONOSCOPY WITH PROPOFOL  N/A 08/17/2019  Procedure: COLONOSCOPY WITH PROPOFOL ;  Surgeon: Rollin Dover, MD;  Location: [facility] ENDOSCOPY;  Service: Endoscopy;  Laterality: N/A;   COLONOSCOPY WITH PROPOFOL  N/A 06/21/2022   Procedure: COLONOSCOPY WITH PROPOFOL ;  Surgeon: Rollin Dover, MD;  Location: [facility] ENDOSCOPY;  Service: Gastroenterology;  Laterality: N/A;   ESOPHAGOGASTRODUODENOSCOPY (EGD) WITH PROPOFOL  N/A 08/17/2019   Procedure: ESOPHAGOGASTRODUODENOSCOPY (EGD) WITH PROPOFOL ;  Surgeon: Rollin Dover, MD;  Location: [facility] ENDOSCOPY;  Service: Endoscopy;  Laterality: N/A;   GASTROCNEMIUS RECESSION Right 07/27/2015   Procedure: RIGHT GASTROC RECESSION;  Surgeon: Norleen Armor, MD;  Location: [facility];  Service: Orthopedics;  Laterality: Right;   KIDNEY STONE SURGERY  1980's   removal   LITHOTRIPSY     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   PARTIAL HYSTERECTOMY  1990's   POLYPECTOMY  08/17/2019   Procedure: POLYPECTOMY;  Surgeon: Rollin Dover, MD;  Location: [facility] ENDOSCOPY;  Service: Endoscopy;;   POLYPECTOMY  06/21/2022   Procedure: POLYPECTOMY;  Surgeon: Rollin Dover, MD;  Location: [facility];  Service: Gastroenterology;;   ROBOT ASSISTED LAPAROSCOPIC NEPHRECTOMY Left 08/14/2022   Procedure: XI ROBOTIC ASSISTED LAPAROSCOPIC NEPHRECTOMY;  Surgeon: Devere Lonni Righter, MD;  Location: [facility]S;  Service: Urology;  Laterality: Left;   ROTATOR CUFF REPAIR  2005   XI ROBOTIC LAPAROSCOPIC ASSISTED APPENDECTOMY N/A 08/14/2022   Procedure: XI ROBOTIC LAPAROSCOPIC ASSISTED APPENDECTOMY;  Surgeon: Debby Hila, MD;  Location: [facility];  Service: General;  Laterality: N/A;    Allergies: Codeine , Doxycycline , Epinephrine  hcl, Erythromycin ethylsuccinate, Eucalyptus oil, Iohexol , Lidocaine , Menthol, Metformin , Oxycodone , and Tape  Medications: Prior to Admission medications   Medication Sig Start Date End Date Taking? Authorizing Provider  albuterol  (PROAIR  HFA) 108 (90 Base) MCG/ACT inhaler Inhale 2 puffs into the lungs every 6 (six) hours as needed for wheezing or shortness of breath. 06/13/23   Bedsole, Amy E, MD  apixaban  (ELIQUIS ) 5 MG TABS tablet Take 2 tablets (10 mg total) by mouth 2 (two) times daily for 6 days, THEN 1 tablet (5 mg total) 2 (two) times daily. 02/28/24 05/04/24  Jillian Buttery, MD  atenolol  (TENORMIN ) 25 MG tablet TAKE 1 TABLET (25 MG TOTAL) BY MOUTH DAILY. 01/19/24   Court Dorn PARAS, MD  cetirizine (ZYRTEC) 10 MG tablet Take 10 mg by mouth daily.    [provider]  Coenzyme Q10 100 MG TABS Take 100 mg by mouth daily.    [provider]  CRANBERRY PO Take 1,000 mg by mouth in the morning and at bedtime.    [provider]  D-MANNOSE PO Take 4,000 mg by mouth in the morning and at bedtime.    [provider]  DULoxetine  (CYMBALTA ) 60 MG capsule Take 1 capsule (60 mg total) by mouth daily. 07/11/23   Bedsole, Amy E, MD  furosemide  (LASIX ) 40 MG tablet Take 40 mg by mouth daily.    [provider]  guaiFENesin  (MUCINEX ) 600 MG 12 hr tablet Take 1 tablet (600 mg total) by mouth 2 (two) times daily for 7 days. 02/28/24 03/06/24  Jillian Buttery, MD  HYDROcodone -acetaminophen  (NORCO/VICODIN) 5-325 MG tablet Take 1 tablet by mouth in the morning and at bedtime. 02/16/24   [provider]  hydrOXYzine  (ATARAX ) 25 MG tablet Take 25 mg by mouth 2 (two) times daily as needed for anxiety.    [provider]  insulin  lispro (HUMALOG) 100 UNIT/ML injection Inject into the skin 3 (three) times daily before meals.    [provider]  nitrofurantoin , macrocrystal-monohydrate, (MACROBID ) 100 MG capsule  Take 100 mg by mouth 2 (two) times daily.    [provider]  pantoprazole  (PROTONIX ) 40 MG tablet TAKE 1 TABLET BY MOUTH EVERY DAY 09/17/19   Wendling, Mabel Mt, DO  polyethylene glycol (MIRALAX  / GLYCOLAX ) 17 g packet Take 17 g by mouth daily as needed for mild constipation. 06/09/23   Hongalgi, Anand D, MD  predniSONE  (DELTASONE ) 10 MG tablet Take 10 mg by mouth daily with breakfast.    [provider]  promethazine  (PHENERGAN ) 12.5 MG tablet Take 12.5 mg by mouth 2 (two) times daily as needed for nausea. 02/16/24   [provider]  traMADol  (ULTRAM ) 50 MG tablet Take 1 tablet (50 mg total) by mouth every 8 (eight) hours as needed for severe pain (pain score 7-10). 10/21/23   Amin, Ankit C, MD  UNABLE TO FIND Take 1 tablet by mouth in the morning and at bedtime. Med Name: Korene NOVAK Vitamin     [provider]     Family History  Problem Relation Age of Onset   Clotting disorder Mother    Cirrhosis Father    Diabetes Other        2 aunts   Cancer Other        grandmother   Allergies Other        whole family   Asthma Other        whole family    Social History   Socioeconomic History   Marital status: Widowed    Spouse name: Not on file   Number of children: 5   Years of education: Not on file   Highest education level: Not on file  Occupational History    Employer: RETIRED  Tobacco Use   Smoking status: Never   Smokeless tobacco: Never  Vaping Use   Vaping status: Never Used  Substance and Sexual Activity   Alcohol use: No    Alcohol/week: 0.0 standard drinks of alcohol   Drug use: No   Sexual activity: Not on file  Other Topics Concern   Not on file  Social History Narrative   Husband treated for testicular cancer, 2 cups coffee a day, no exercise   Social Drivers of Health   Financial Resource Strain: Low Risk  (01/14/2022)   Overall Financial Resource Strain (CARDIA)    Difficulty of Paying Living Expenses: Not very hard  Food Insecurity: No Food Insecurity (03/03/2024)   Hunger Vital Sign    Worried About Running Out of Food in the Last Year: Never true    Ran Out of Food in the Last Year: Never true  Transportation Needs: No Transportation Needs (03/03/2024)   PRAPARE - Administrator, Civil Service (Medical): No    Lack of Transportation (Non-Medical): No  Physical Activity: Inactive (01/14/2022)   Exercise Vital Sign    Days of Exercise per Week: 0 days    Minutes of Exercise per Session: 0 min  Stress: No Stress Concern Present (01/14/2022)   [employer] of Occupational Health - Occupational Stress Questionnaire    Feeling of Stress : Only a little  Social Connections: Moderately Integrated (03/03/2024)   Social Connection and Isolation Panel    Frequency of Communication with Friends and Family: Three times  a week    Frequency of Social Gatherings with Friends and Family: Three times a week    Attends Religious Services: More than 4 times per year    Active Member of Clubs or Organizations: Yes  Attends Banker Meetings: More than 4 times per year    Marital Status: Widowed     Review of Systems: A 12 point ROS discussed and pertinent positives are indicated in the HPI above.  All other systems are negative.  Review of Systems  Constitutional:  Negative for fatigue and fever.  HENT:  Negative for congestion.   Respiratory:  Negative for cough and shortness of breath.   Gastrointestinal:  Negative for abdominal pain, diarrhea, nausea and vomiting.  Musculoskeletal:  Positive for back pain.  Skin:  Positive for wound.    Vital Signs: BP (!) 110/56 (BP Location: Left Wrist)   Pulse 66   Temp (!) 97.2 F (36.2 C) (Axillary)   Resp 14   Ht 5' 3 (1.6 m)   Wt 183 lb 13.8 oz (83.4 kg)   SpO2 99%   BMI 32.57 kg/m   Advance Care Plan: The advanced care plan/surrogate decision maker was discussed at the time of visit and the patient did not wish to discuss or was not able to name a surrogate decision maker or provide an advance care plan.    Physical Exam Vitals and nursing note reviewed.  Constitutional:      Appearance: She is well-developed.  HENT:     Head: Normocephalic and atraumatic.  Eyes:     Conjunctiva/sclera: Conjunctivae normal.  Cardiovascular:     Rate and Rhythm: Normal rate and regular rhythm.  Pulmonary:     Effort: Pulmonary effort is normal.  Musculoskeletal:        General: Normal range of motion.     Cervical back: Normal range of motion.  Skin:    General: Skin is warm.     Findings: Bruising (to forehead and left side of face.) present.  Neurological:     Mental Status: She is alert and oriented to person, place, and time.     Imaging: CTA Chest: There is adequate opacification of the pulmonary arterial tree. Previously noted  saddle embolus has migrated distally into the right main pulmonary artery with near occlusion of the origin of the right upper lobar and right middle lobar pulmonary arteries and extension into the right lower lobar pulmonary artery. The right pulmonary artery appears mildly, progressively dilated measuring up to 2.9 cm in diameter proximal to the level of obstruction. The left pulmonary artery demonstrates no intraluminal filling defect. RV:LV = 1.3  Echocardiogram: ordered  Bilateral lower extremity venous duplex: Summary:  RIGHT:  - Findings consistent with acute deep vein thrombosis involving the right  posterior tibial veins.    LEFT:  - Findings consistent with age indeterminate deep vein thrombosis  involving the left common femoral vein, and SF junction.    Labs:  BNP: 54.6 Troponin: 9  CBC: Recent Labs    02/27/24 0456 03/01/24 1458 03/02/24 1733 03/03/24 0345  WBC 3.9* 10.8* 8.4 6.4  HGB 12.6 15.0 14.4 14.0  HCT 38.1 44.6 44.0 42.0  PLT 121* 201 175 142*    COAGS: No results for input(s): INR, APTT in the last 8760 hours.  BMP: Recent Labs    02/28/24 0232 03/01/24 1458 03/02/24 1733 03/03/24 0345  NA 138 142 139 138  K 3.8 3.4* 3.6 4.4  CL 102 103 101 101  CO2 26 32 25 26  GLUCOSE 111* 49* 65* 225*  BUN 29* 28* 25* 23  CALCIUM 9.1 9.2 9.1 8.8*  CREATININE 1.22* 1.30* 1.31* 1.26*  GFRNONAA 45* 42* 42* 44*  LIVER FUNCTION TESTS: Recent Labs    11/18/23 1202 02/26/24 2002 03/01/24 1458 03/02/24 1733  BILITOT 1.2 2.0* 1.1 1.0  AST 47* 59* 42* 42*  ALT 60* 41 34 31  ALKPHOS 197* 250* 205* 177*  PROT 6.4 6.7 6.5 5.8*  ALBUMIN 3.6 3.3* 3.7 3.1*     PESI Score (BridalFinder.es): 118    Assessment and Plan: [name] [name] is a 78 y.o. female patient. History of  renal cell carcinoma s/p nephrectomy with solitary kidney, DM, CHF, autoimmune hepatitis/cirrhosis, Recently  admitted with PE.  Found to have bilateral DVT Presented to the ED at [facility] on 8.12.25 with injuries related to a fall.  CT Angio chest from 8.13.25 reads Previously noted saddle embolus has migrated distally into the right main pulmonary artery with near occlusion of the origin of the right upper lobar and right middle lobar pulmonary arteries and extension into the right lower lobar pulmonary artery. The right pulmonary artery appears mildly, progressively dilated measuring up to 2.9 cm in diameter proximal to the level of obstruction. CT evidence of right heart strain is unchanged (RV/LV- 1.3). Patient currently on a heparin  gtt.  Presenting with [employer] Thrombectomy risk acute pulmonary embolism.    PLAN: IR Image Guided PE Thrombectomy and IVC Filter Placement  The Risks and benefits of embolization were discussed with the patient including, but not limited to bleeding, infection, vascular injury, post operative pain, or contrast induced renal failure.  This procedure involves the use of X-rays and because of the nature of the planned procedure, it is possible that we will have prolonged use of X-ray fluoroscopy.  Potential radiation risks to you include (but are not limited to) the following: - A slightly elevated risk for cancer several years later in life. This risk is typically less than 0.5% percent. This risk is low in comparison to the normal incidence of human cancer, which is 33% for women and 50% for men according to the American Cancer Society. - Radiation induced injury can include skin redness, resembling a rash, tissue breakdown / ulcers and hair loss (which can be temporary or permanent).   The likelihood of either of these occurring depends on the difficulty of the procedure and whether you are sensitive to radiation due to previous procedures, disease, or genetic conditions.   IF your procedure requires a prolonged use of radiation, you will be notified and  given written instructions for further action.  It is your responsibility to monitor the irradiated area for the 2 weeks following the procedure and to notify your physician if you are concerned that you have suffered a radiation induced injury.    All of the patient's questions were answered, patient is agreeable to proceed. Consent signed and in chart.    Electronically Signed: Delon JAYSON Beagle, NP 03/03/2024, 8:31 AM   I spent a total of 40 Minutes in face to face in clinical consultation, greater than 50% of which was counseling/coordinating care for PE Thrombectomy and IVC filter placement

## 2024-03-03 NOTE — Progress Notes (Addendum)
 PHARMACY - ANTICOAGULATION CONSULT NOTE  Pharmacy Consult for heparin  Indication: pulmonary embolus and DVT  Allergies  Allergen Reactions   Codeine  Other (See Comments)    Unknown    Doxycycline  Hives   Epinephrine  Hcl Other (See Comments)    Had tremors when it was given.   Erythromycin Ethylsuccinate Other (See Comments)    Abdominal spasms   Eucalyptus Oil Other (See Comments)    Unknown    Iohexol  Hives    Pt needs premedicated-- hives on prev contrast study per md office    Lidocaine  Other (See Comments)    Tremors (lasted months)   Menthol Other (See Comments)    Unknown    Metformin  Diarrhea   Oxycodone  Other (See Comments)    felt like I was dying   Tape Rash    Patient Measurements: Height: 5' 3 (160 cm) Weight: 83.4 kg (183 lb 13.8 oz) IBW/kg (Calculated) : 52.4 HEPARIN  DW (KG): 70.8  Vital Signs: Temp: 97.1 F (36.2 C) (08/13 1111) Temp Source: Axillary (08/13 1111) BP: 115/62 (08/13 1100) Pulse Rate: 79 (08/13 1100)  Labs: Recent Labs    03/01/24 1458 03/02/24 1733 03/02/24 1944 03/03/24 0345 03/03/24 0932  HGB 15.0 14.4  --  14.0  --   HCT 44.6 44.0  --  42.0  --   PLT 201 175  --  142*  --   APTT  --   --   --   --  >200*  HEPARINUNFRC  --   --   --   --  >1.10*  CREATININE 1.30* 1.31*  --  1.26*  --   TROPONINIHS  --  9 9  --   --     Estimated Creatinine Clearance: 37.6 mL/min (A) (by C-G formula based on SCr of 1.26 mg/dL (H)).   Medical History: Past Medical History:  Diagnosis Date   Allergic rhinitis, cause unspecified    Anxiety    Arthritis    hands, feet and back   Asthma    Benign neoplasm of colon    Bursitis    hips   CHF (congestive heart failure) (HCC)    Cirrhosis (HCC)    auto immune   Complication of anesthesia     i WAKE UP WITH TREMORS  after shoulder surgery tremors for 10 months after surgery surgery- 2009- surgical center in [place]   Cyst and pseudocyst of pancreas    Depressive disorder, not  elsewhere classified    Diabetes mellitus without complication (HCC)    Diverticulitis    Diverticulosis of colon (without mention of hemorrhage)    Dyspnea    Dysrhythmia    arrhythmia   Esophageal reflux    Essential and other specified forms of tremor    History of GI bleed    History of kidney stones    Irritable bowel syndrome    Obesity, unspecified    Other specified cardiac dysrhythmias(427.89)    Periapical abscess without sinus    Polyneuropathy in diabetes(357.2)    PONV (postoperative nausea and vomiting)    Pure hypercholesterolemia    Renal cell carcinoma (HCC)    Sleep apnea    CPAP- non compliant   Type II or unspecified type diabetes mellitus with neurological manifestations, not stated as uncontrolled(250.60)     Medications:  -Eliquis  10mg  PO BID for large PE (LD 8/12 11AM)  Assessment: 2 yoF presented to ED after fall with recurrent syncopal episodes. CT showed migration of clot into R. Main  pulmonary artery; RHS unchanged from last admission. She will undergo thrombectomy today @1300 . Pharmacy consulted to dose heparin  for PE and bilateral LE DVT.  CBC stable. Aptt supra therapeutic at >200. Per nurse, level was draw via lap stick and patient does not have a line. No concerns of bleeding at this time.   Goal of Therapy:  Heparin  level 0.3-0.7 units/ml aPTT 66-102 seconds Monitor platelets by anticoagulation protocol: Yes   Plan:  Hold heparin  for ~ 1hr until after patient returns from thrombectomy.  Restart heparin  infusion when patient returns at reduced dose at 1000 units/hr. Check aptt in 8 hours.   Vermell Mccallum, PharmD CCM Pharmacy Resident 03/03/2024 12:21 PM

## 2024-03-03 NOTE — Progress Notes (Signed)
 Bilateral lower extremity venous duplex has been completed. Preliminary results can be found in CV Proc through chart review.   03/03/24 9:45 AM Cathlyn Collet RVT

## 2024-03-03 NOTE — Progress Notes (Signed)
 PHARMACY - ANTICOAGULATION CONSULT NOTE  Pharmacy Consult for heparin  Indication: pulmonary embolus and DVT  Allergies  Allergen Reactions   Codeine  Other (See Comments)    Unknown    Doxycycline  Hives   Epinephrine  Hcl Other (See Comments)    Had tremors when it was given.   Erythromycin Ethylsuccinate Other (See Comments)    Abdominal spasms   Eucalyptus Oil Other (See Comments)    Unknown    Iohexol  Hives    Pt needs premedicated-- hives on prev contrast study per md office    Lidocaine  Other (See Comments)    Tremors (lasted months)   Menthol Other (See Comments)    Unknown    Metformin  Diarrhea   Oxycodone  Other (See Comments)    felt like I was dying   Tape Rash    Patient Measurements: Height: 5' 3 (160 cm) Weight: 83 kg (183 lb) IBW/kg (Calculated) : 52.4 HEPARIN  DW (KG): 70.8  Vital Signs: Temp: 97.7 F (36.5 C) (08/12 2143) Temp Source: Temporal (08/12 2143) BP: 107/75 (08/13 0045) Pulse Rate: 68 (08/13 0045)  Labs: Recent Labs    03/01/24 1458 03/02/24 1733 03/02/24 1944  HGB 15.0 14.4  --   HCT 44.6 44.0  --   PLT 201 175  --   CREATININE 1.30* 1.31*  --   TROPONINIHS  --  9 9    Estimated Creatinine Clearance: 36.1 mL/min (A) (by C-G formula based on SCr of 1.31 mg/dL (H)).   Medical History: Past Medical History:  Diagnosis Date   Allergic rhinitis, cause unspecified    Anxiety    Arthritis    hands, feet and back   Asthma    Benign neoplasm of colon    Bursitis    hips   CHF (congestive heart failure) (HCC)    Cirrhosis (HCC)    auto immune   Complication of anesthesia     i WAKE UP WITH TREMORS  after shoulder surgery tremors for 10 months after surgery surgery- 2009- surgical center in [place]   Cyst and pseudocyst of pancreas    Depressive disorder, not elsewhere classified    Diabetes mellitus without complication (HCC)    Diverticulitis    Diverticulosis of colon (without mention of hemorrhage)    Dyspnea     Dysrhythmia    arrhythmia   Esophageal reflux    Essential and other specified forms of tremor    History of GI bleed    History of kidney stones    Irritable bowel syndrome    Obesity, unspecified    Other specified cardiac dysrhythmias(427.89)    Periapical abscess without sinus    Polyneuropathy in diabetes(357.2)    PONV (postoperative nausea and vomiting)    Pure hypercholesterolemia    Renal cell carcinoma (HCC)    Sleep apnea    CPAP- non compliant   Type II or unspecified type diabetes mellitus with neurological manifestations, not stated as uncontrolled(250.60)     Medications:  -Eliquis  10mg  PO BID for large PE (LD 8/12 11AM)  Assessment: 31 yoF presented to ED after fall with recurrent syncopal episodes. Pharmacy consulted to dose heparin  for PE and bilateral LE DVT.  -CTa: migration of clot into R. Main pulmonary artery; RHS unchanged from last admission -Last dose Eliquis  8/12 AM -CBC stable  Goal of Therapy:  Heparin  level 0.3-0.7 units/ml aPTT 66-102 seconds Monitor platelets by anticoagulation protocol: Yes   Plan:  Heparin  3000 units IV x1 given RHS and saddle PE, small  bolus given recent eliquis  use Start heparin  infusion at 1250 units/hr Check anti-Xa level in 8 hours and daily while on heparin  Continue to monitor H&H and platelets Follow up consult with IR for thrombectomy  Lynwood Poplar, PharmD, BCPS Clinical Pharmacist 03/03/2024 1:44 AM

## 2024-03-03 NOTE — Sedation Documentation (Addendum)
 ACT 170, orders to recheck in 30 mins

## 2024-03-03 NOTE — H&P (Signed)
 NAMELaqueisha Catalina, MRN:  [id number], DOB:  June 30, 1946, LOS: 0 ADMISSION DATE:  03/02/2024, CONSULTATION DATE:  03/03/24  REFERRING MD:  ER doc, CHIEF COMPLAINT:  Fall   History of Present Illness:   78yoF patient h/o RCC s/p nephrectomy, T2DM, CHF, anxiety, GERD, recent hospitalization for an acute saddle PE nonoperable at that time along with bilateral LE DVT discharged on eliquis  now here with level 2 trauma alert for ground level fall. Patient has history of multiple falls and orthostatic hypotension.  Trauma survey negative for any acute findings. However, patient CT PE repeat scan shows previous saddle embolus has migrated distally into the right main PA with occlusion of right pulmonary branches thus will admit to ICU for closer observation, heparin  drip and oxygen support. IR aware of this patient, agree with plan and will evaluate for possible intervention. Patient seen in the ER with daughters bedside.   Pertinent  Medical History   has a past medical history of Allergic rhinitis, cause unspecified, Anxiety, Arthritis, Asthma, Benign neoplasm of colon, Bursitis, CHF (congestive heart failure) (HCC), Cirrhosis (HCC), Complication of anesthesia, Cyst and pseudocyst of pancreas, Depressive disorder, not elsewhere classified, Diabetes mellitus without complication (HCC), Diverticulitis, Diverticulosis of colon (without mention of hemorrhage), Dyspnea, Dysrhythmia, Esophageal reflux, Essential and other specified forms of tremor, History of GI bleed, History of kidney stones, Irritable bowel syndrome, Obesity, unspecified, Other specified cardiac dysrhythmias(427.89), Periapical abscess without sinus, Polyneuropathy in diabetes(357.2), PONV (postoperative nausea and vomiting), Pure hypercholesterolemia, Renal cell carcinoma (HCC), Sleep apnea, and Type II or unspecified type diabetes mellitus with neurological manifestations, not stated as uncontrolled(250.60).   Significant Hospital  Events: Including procedures, antibiotic start and stop dates in addition to other pertinent events     Interim History / Subjective:  8/13 - admit for acute PE  Objective    Blood pressure 107/75, pulse 68, temperature 97.7 F (36.5 C), temperature source Temporal, resp. rate 19, height 5' 3 (1.6 m), weight 83 kg, SpO2 95%.        Intake/Output Summary (Last 24 hours) at 03/03/2024 0141 Last data filed at 03/02/2024 2226 Gross per 24 hour  Intake 1000 ml  Output --  Net 1000 ml   Filed Weights   03/02/24 1730  Weight: 83 kg    Examination: General: awake , alert HENT: normal ears, moist mucous membranes Lungs: symmetric expansion, no wheezing Cardiovascular: RRR, no murmur Abdomen: soft, nondistended Extremities: no edema, no cyanosis Neuro: CNII to CNXII grossly intact, no sensory deficits    Resolved problem list   Assessment and Plan   Acute saddle PE  - prior PE has migrated distally, with right PA , pulmonary occlusion. - check BNP, Echo, follow trops - heparin  drip - bilateral LE duplex IR eval, aware of patient. O2 to keep sats above 92%  2. Orthostatic hypotension / GLF - trauma survey complete - mentation fluctuating at this time in midst of syncopal episodes and medications for contrast allergy.  3. T2DM - tight glycemic control Home meds as tolerated  Review home meds and resume management for other comorbid illnesses. D/w Daughters bedside  Labs/chart/imaging reviewed Neuro checks per protocol Monitor hemodynamics Keep Spo2>92%, monitor for respiratory failure Keep NPO Monitor I/Os, avoid nephrotoxic agents, Foley only for accurate I/Os F/u culture results if ordered, monitor for s/s sepsis. Monitor H&H , transfuse blood products as needed Tight glycemic control eLink TeleICU consult d/w ICU RN  Best Practice (right click and Reselect all SmartList Selections daily)  Diet/type: NPO DVT prophylaxis systemic heparin  Pressure  ulcer(s): N/A GI prophylaxis: H2B Lines: N/A Foley:  N/A Code Status:  full code Last date of multidisciplinary goals of care discussion [03/03/24]  Labs   CBC: Recent Labs  Lab 02/26/24 2002 02/27/24 0456 03/01/24 1458 03/02/24 1733  WBC 3.0* 3.9* 10.8* 8.4  NEUTROABS  --   --  7.2 5.0  HGB 13.9 12.6 15.0 14.4  HCT 43.4 38.1 44.6 44.0  MCV 91.4 91.8 89.2 92.6  PLT 126* 121* 201 175    Basic Metabolic Panel: Recent Labs  Lab 02/26/24 1916 02/26/24 2002 02/28/24 0232 03/01/24 1458 03/02/24 1733  NA  --  132* 138 142 139  K  --  4.6 3.8 3.4* 3.6  CL  --  99 102 103 101  CO2  --  20* 26 32 25  GLUCOSE  --  431* 111* 49* 65*  BUN  --  17 29* 28* 25*  CREATININE 1.20* 1.21* 1.22* 1.30* 1.31*  CALCIUM  --  9.3 9.1 9.2 9.1   GFR: Estimated Creatinine Clearance: 36.1 mL/min (A) (by C-G formula based on SCr of 1.31 mg/dL (H)). Recent Labs  Lab 02/26/24 2002 02/27/24 0456 03/01/24 1458 03/02/24 1733  WBC 3.0* 3.9* 10.8* 8.4    Liver Function Tests: Recent Labs  Lab 02/26/24 2002 03/01/24 1458 03/02/24 1733  AST 59* 42* 42*  ALT 41 34 31  ALKPHOS 250* 205* 177*  BILITOT 2.0* 1.1 1.0  PROT 6.7 6.5 5.8*  ALBUMIN 3.3* 3.7 3.1*   No results for input(s): LIPASE, AMYLASE in the last 168 hours. No results for input(s): AMMONIA in the last 168 hours.  ABG No results found for: PHART, PCO2ART, PO2ART, HCO3, TCO2, ACIDBASEDEF, O2SAT   Coagulation Profile: No results for input(s): INR, PROTIME in the last 168 hours.  Cardiac Enzymes: No results for input(s): CKTOTAL, CKMB, CKMBINDEX, TROPONINI in the last 168 hours.  HbA1C: Hemoglobin A1C  Date/Time Value Ref Range Status  10/08/2018 12:00 AM 8.4  Final   Hgb A1c MFr Bld  Date/Time Value Ref Range Status  10/19/2023 04:03 AM 7.9 (H) 4.8 - 5.6 % Final    Comment:    (NOTE) Pre diabetes:          5.7%-6.4%  Diabetes:              >6.4%  Glycemic control for    <7.0% adults with diabetes   08/05/2022 01:40 PM 7.9 (H) 4.8 - 5.6 % Final    Comment:    (NOTE) Pre diabetes:          5.7%-6.4%  Diabetes:              >6.4%  Glycemic control for   <7.0% adults with diabetes     CBG: Recent Labs  Lab 02/27/24 2151 02/28/24 0747 02/28/24 1154 03/02/24 1910 03/02/24 2034  GLUCAP 345* 114* 274* 60* 154*    Review of Systems:   14 point ROS completed and negative except what is stated in the HPI  Past Medical History:  She,  has a past medical history of Allergic rhinitis, cause unspecified, Anxiety, Arthritis, Asthma, Benign neoplasm of colon, Bursitis, CHF (congestive heart failure) (HCC), Cirrhosis (HCC), Complication of anesthesia, Cyst and pseudocyst of pancreas, Depressive disorder, not elsewhere classified, Diabetes mellitus without complication (HCC), Diverticulitis, Diverticulosis of colon (without mention of hemorrhage), Dyspnea, Dysrhythmia, Esophageal reflux, Essential and other specified forms of tremor, History of GI bleed, History of kidney stones, Irritable bowel  syndrome, Obesity, unspecified, Other specified cardiac dysrhythmias(427.89), Periapical abscess without sinus, Polyneuropathy in diabetes(357.2), PONV (postoperative nausea and vomiting), Pure hypercholesterolemia, Renal cell carcinoma (HCC), Sleep apnea, and Type II or unspecified type diabetes mellitus with neurological manifestations, not stated as uncontrolled(250.60).   Surgical History:   Past Surgical History:  Procedure Laterality Date   ABDOMINAL HYSTERECTOMY     CALCANEAL OSTEOTOMY Right 07/27/2015   Procedure: RIGHT CALCANEAL OSTEOTOMY ;  Surgeon: Norleen Armor, MD;  Location: [facility];  Service: Orthopedics;  Laterality: Right;   CHOLECYSTECTOMY     15-20 years ago   COLONOSCOPY WITH PROPOFOL  N/A 08/17/2019   Procedure: COLONOSCOPY WITH PROPOFOL ;  Surgeon: Rollin Dover, MD;  Location: [facility] ENDOSCOPY;  Service: Endoscopy;  Laterality: N/A;    COLONOSCOPY WITH PROPOFOL  N/A 06/21/2022   Procedure: COLONOSCOPY WITH PROPOFOL ;  Surgeon: Rollin Dover, MD;  Location: [facility];  Service: Gastroenterology;  Laterality: N/A;   ESOPHAGOGASTRODUODENOSCOPY (EGD) WITH PROPOFOL  N/A 08/17/2019   Procedure: ESOPHAGOGASTRODUODENOSCOPY (EGD) WITH PROPOFOL ;  Surgeon: Rollin Dover, MD;  Location: [facility] END[facility];  Service: Endoscopy;  Laterality: N/A;   GASTROCNEMIUS RECESSION Right 07/27/2015   Procedure: RIGHT GASTROC RECESSION;  Surgeon: Norleen Armor, MD;  Location: [facility];  Service: Orthopedics;  Laterality: Right;   KIDNEY STONE SURGERY  1980's   removal   LITHOTRIPSY     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   PARTIAL HYSTERECTOMY  1990's   POLYPECTOMY  08/17/2019   Procedure: POLYPECTOMY;  Surgeon: Rollin Dover, MD;  Location: [facility] END[facility]COPY;  Service: Endoscopy;;   POLYPECTOMY  06/21/2022   Procedure: POLYPECTOMY;  Surgeon: Rollin Dover, MD;  Location: [facility];  Service: Gastroenterology;;   ROBOT ASSISTED LAPAROSCOPIC NEPHRECTOMY Left 08/14/2022   Procedure: XI ROBOTIC ASSISTED LAPAROSCOPIC NEPHRECTOMY;  Surgeon: Devere Lonni Righter, MD;  Location: [facility];  Service: Urology;  Laterality: Left;   ROTATOR CUFF REPAIR  2005   XI ROBOTIC LAPAROSCOPIC ASSISTED APPENDECTOMY N/A 08/14/2022   Procedure: XI ROBOTIC LAPAROSCOPIC ASSISTED APPENDECTOMY;  Surgeon: Debby Hila, MD;  Location: [facility];  Service: General;  Laterality: N/A;     Social History:   reports that she has never smoked. She has never used smokeless tobacco. She reports that she does not drink alcohol and does not use drugs.   Family History:  Her family history includes Allergies in an other family member; Asthma in an other family member; Cancer in an other family member; Cirrhosis in her father; Clotting disorder in her mother; Diabetes in an other family member.   Allergies Allergies  Allergen Reactions   Codeine  Other (See Comments)    Unknown    Doxycycline  Hives    Epinephrine  Hcl Other (See Comments)    Had tremors when it was given.   Erythromycin Ethylsuccinate Other (See Comments)    Abdominal spasms   Eucalyptus Oil Other (See Comments)    Unknown    Iohexol  Hives    Pt needs premedicated-- hives on prev contrast study per md office    Lidocaine  Other (See Comments)    Tremors (lasted months)   Menthol Other (See Comments)    Unknown    Metformin  Diarrhea   Oxycodone  Other (See Comments)    felt like I was dying   Tape Rash     Home Medications  Prior to Admission medications   Medication Sig Start Date End Date Taking? Authorizing Provider  albuterol  (PROAIR  HFA) 108 (90 Base) MCG/ACT inhaler Inhale 2 puffs into the lungs every 6 (six) hours as  needed for wheezing or shortness of breath. 06/13/23   Bedsole, Amy E, MD  apixaban  (ELIQUIS ) 5 MG TABS tablet Take 2 tablets (10 mg total) by mouth 2 (two) times daily for 6 days, THEN 1 tablet (5 mg total) 2 (two) times daily. 02/28/24 05/04/24  Jillian Buttery, MD  atenolol  (TENORMIN ) 25 MG tablet TAKE 1 TABLET (25 MG TOTAL) BY MOUTH DAILY. 01/19/24   Court Dorn PARAS, MD  cetirizine (ZYRTEC) 10 MG tablet Take 10 mg by mouth daily.    [provider]  Coenzyme Q10 100 MG TABS Take 100 mg by mouth daily.    [provider]  CRANBERRY PO Take 1,000 mg by mouth in the morning and at bedtime.    [provider]  D-MANNOSE PO Take 4,000 mg by mouth in the morning and at bedtime.    [provider]  DULoxetine  (CYMBALTA ) 60 MG capsule Take 1 capsule (60 mg total) by mouth daily. 07/11/23   Bedsole, Amy E, MD  furosemide  (LASIX ) 40 MG tablet Take 40 mg by mouth daily.    [provider]  guaiFENesin  (MUCINEX ) 600 MG 12 hr tablet Take 1 tablet (600 mg total) by mouth 2 (two) times daily for 7 days. 02/28/24 03/06/24  Jillian Buttery, MD  HYDROcodone -acetaminophen  (NORCO/VICODIN) 5-325 MG tablet Take 1 tablet by mouth in the morning and at bedtime. 02/16/24    [provider]  hydrOXYzine  (ATARAX ) 25 MG tablet Take 25 mg by mouth 2 (two) times daily as needed for anxiety.    [provider]  insulin  lispro (HUMALOG) 100 UNIT/ML injection Inject into the skin 3 (three) times daily before meals.    [provider]  nitrofurantoin , macrocrystal-monohydrate, (MACROBID ) 100 MG capsule Take 100 mg by mouth 2 (two) times daily.    [provider]  pantoprazole  (PROTONIX ) 40 MG tablet TAKE 1 TABLET BY MOUTH EVERY DAY 09/17/19   Wendling, Mabel Mt, DO  polyethylene glycol (MIRALAX  / GLYCOLAX ) 17 g packet Take 17 g by mouth daily as needed for mild constipation. 06/09/23   Hongalgi, Anand D, MD  predniSONE  (DELTASONE ) 10 MG tablet Take 10 mg by mouth daily with breakfast.    [provider]  promethazine  (PHENERGAN ) 12.5 MG tablet Take 12.5 mg by mouth 2 (two) times daily as needed for nausea. 02/16/24   [provider]  traMADol  (ULTRAM ) 50 MG tablet Take 1 tablet (50 mg total) by mouth every 8 (eight) hours as needed for severe pain (pain score 7-10). 10/21/23   Amin, Ankit C, MD  UNABLE TO FIND Take 1 tablet by mouth in the morning and at bedtime. Med Name: Korene NOVAK Vitamin    [provider]     Critical care time: 60 minutes

## 2024-03-03 NOTE — Procedures (Signed)
 Interventional Radiology Procedure Note  Procedure:  1) Pulmonary artery thrombectomy 2) IVC filter placement  Findings: Please refer to procedural dictation for full description. R CFV 24 Fr access, closed with 2 proglides.  Complications: None immediate  Estimated Blood Loss: 100 mL  Recommendations: 2 hour bedrest Resume heparin  gtt IR will follow   Ester Sides, MD

## 2024-03-03 NOTE — TOC Initial Note (Signed)
 Transition of Care [facility]) - Initial/Assessment Note    Patient Details  Name: [name] [name] MRN: [id number] Date of Birth: 1945/09/16  Transition of [facility]) CM/SW Contact:    Lauraine FORBES Saa, LCSWA Phone Number: 03/03/2024, 11:27 AM  Clinical Narrative:                  11:27 AM CSW introduced self and role to patient and patient's daughters. CSW spoke with daughters as patient appeared lethargics. Daughters confirmed patient resides at home alone and they transport patient to appointments. Daughters confirmed patient SNF history at [facility] and stated that patient may not be interested in returning to [facility] if SNF is recommended. Daughters confirmed patient HH history with [facility]. Daughters confirmed patient has home DME (rolling walker, wheelchair, cane, CPAP, adjustable bed, bed side bars) history (per chart review, with [facility] and [facility]). Per chart review, patient has a PCP and insurance. Patient's preferred pharmacy's are [facility] and [facility] [address]   Expected Discharge Plan: Home/Self Care Barriers to Discharge: Continued Medical Work up   Patient Goals and CMS Choice            Expected Discharge Plan and Services       Living arrangements for the past 2 months: Single Family Home                                      Prior Living Arrangements/Services Living arrangements for the past 2 months: Single Family Home Lives with:: Self Patient language and need for interpreter reviewed:: Yes            Current home services: DME Criminal Activity/Legal Involvement Pertinent to Current Situation/Hospitalization: No - Comment as needed  Activities of Daily Living   ADL Screening (condition at time of admission) Independently performs ADLs?: Yes (appropriate for developmental age) Does the patient have a NEW difficulty with bathing/dressing/toileting/self-feeding that is expected to  last >3 days?: No Does the patient have a NEW difficulty with getting in/out of bed, walking, or climbing stairs that is expected to last >3 days?: No Does the patient have a NEW difficulty with communication that is expected to last >3 days?: No Is the patient deaf or have difficulty hearing?: No Does the patient have difficulty seeing, even when wearing glasses/contacts?: No Does the patient have difficulty concentrating, remembering, or making decisions?: No  Permission Sought/Granted Permission sought to share information with : Family Supports    Share Information with NAME: Rico Pinal     Permission granted to share info w Relationship: Daughter  Permission granted to share info w Contact Information: [phone]  Emotional Assessment Appearance:: Appears stated age Attitude/Demeanor/Rapport: Lethargic Affect (typically observed): Stable Orientation: : Oriented to Place, Oriented to  Time, Oriented to Situation, Oriented to Self Alcohol / Substance Use: Not Applicable Psych Involvement: No (comment)  Admission diagnosis:  Pulmonary embolism (HCC) [I26.99] Patient Active Problem List   Diagnosis Date Noted   Pulmonary embolism (HCC) 03/03/2024   Acute pulmonary embolus (HCC) 02/26/2024   Weakness generalized 11/18/2023   PAF (paroxysmal atrial fibrillation) (HCC) 11/18/2023   Irritability and anger 11/18/2023   Dizziness 10/18/2023   Near syncope 10/18/2023   MDD (major depressive disorder), recurrent episode, moderate (HCC) 07/11/2023   Confusion 07/08/2023   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   Acute on chronic low back pain 06/08/2023  Intractable low back pain 06/06/2023   CKD stage 3a, GFR 45-59 ml/min (HCC) 06/06/2023   Lumbar radiculopathy 06/06/2023   Cirrhosis (HCC)    Renal cell cancer (HCC) 09/12/2022   Aortic atherosclerosis (HCC) 06/04/2022   Renal mass, left 05/27/2022   Swelling of limb 04/12/2022   Osteopenia 09/21/2021   History of  nephrolithiasis 09/21/2021   Sensation of pressure in bladder area 09/21/2021   Anemia 08/17/2019   Hypertension 08/17/2019   Anxiety 08/17/2019   Lower GI bleeding 08/17/2019   Acute lower GI bleeding 08/16/2019   Autoimmune hepatitis (HCC) 12/29/2018   Chest pain 12/22/2018   Asthmatic bronchitis with exacerbation 03/21/2017   Elevated LFTs 12/24/2016   SVT (supraventricular tachycardia) (HCC) 12/24/2016   Peripheral edema 12/24/2016   S/P foot surgery, right 07/27/2015   Preoperative clearance 06/29/2015   Angioedema 08/30/2014   Recurrent UTI 01/06/2014   Chronic insomnia 01/06/2014   Acute bilateral low back pain without sciatica 03/07/2011   Vitamin D  deficiency 08/20/2010   TINNITUS, CHRONIC, 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   Lung nodule 08/03/2007   CYST AND PSEUDOCYST OF PANCREAS 08/03/2007   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   TUBULOVILLOUS ADENOMA, COLON 12/30/2006   HYPERCHOLESTEROLEMIA 12/30/2006   OBESITY 12/30/2006   TREMOR, ESSENTIAL 12/30/2006   Palpitations 12/30/2006   Seasonal and perennial allergic rhinitis 12/30/2006   GERD 12/30/2006   DIVERTICULOSIS, COLON 12/30/2006   IBS 12/30/2006   Obstructive sleep apnea 12/30/2006   URINARY INCONTINENCE 12/30/2006   Poorly controlled type 2 diabetes mellitus (HCC) 12/22/2006   PCP:  Avelina Greig [name], MD Pharmacy:   [phone], [place] - [address] [place] [address] Phone: [phone] Fax: [phone]  [place] [facility] [address] [place] [address] Phone: [phone] Fax: [phone]     Social Drivers of Health (SDOH) Social History: SDOH Screenings   Food Insecurity: No Food Insecurity (03/03/2024)  Housing: Low Risk  (03/03/2024)  Transportation Needs: No Transportation Needs (03/03/2024)  Utilities: Not At Risk (03/03/2024)  Depression (PHQ2-9): High Risk (07/11/2023)  Financial Resource  Strain: Low Risk  (01/14/2022)  Physical Activity: Inactive (01/14/2022)  Social Connections: Moderately Integrated (03/03/2024)  Stress: No Stress Concern Present (01/14/2022)  Tobacco Use: Low Risk  (02/26/2024)   SDOH Interventions:     Readmission Risk Interventions     No data to display

## 2024-03-03 NOTE — Progress Notes (Addendum)
 eLink Physician-Brief Progress Note Patient Name: [name] [name] DOB: [birth year] MRN: [id number]   Date of Service  03/03/2024  HPI/Events of Note  Patient now in ICU. Awake and talking to RN. Had some hallucinations after getting meds for contrast prep (has happened before as well) but no focal findings and following all commands now. HR in 60s. Sbp 145. O2 95 on nasal o2. RR is 16-18. No distress. IV heparin  being used for PE. IR consulted.   eICU Interventions  Troponin normal BNP pending Watch hemodynamics IR to decide possible thrombectomy in AM Call E link if needed     Intervention Category Major Interventions: Respiratory failure - evaluation and management Evaluation Type: New Patient Evaluation  Jamiel Goncalves G Citlaly Camplin 03/03/2024, 2:23 AM  535 am - 2 gram mag x 1 ordered

## 2024-03-03 NOTE — Progress Notes (Signed)
 NAMEBrendaliz [name], MRN:  [id number], DOB:  May 09, 1946, LOS: 0 ADMISSION DATE:  03/02/2024, CONSULTATION DATE:  03/03/24  REFERRING MD:  ER doc, CHIEF COMPLAINT:  Fall   History of Present Illness:   78yoF patient h/o RCC s/p nephrectomy, T2DM, CHF, anxiety, GERD, recent hospitalization for an acute saddle PE nonoperable at that time along with bilateral LE DVT discharged on eliquis  now here with level 2 trauma alert for ground level fall. Patient has history of multiple falls and orthostatic hypotension.  Trauma survey negative for any acute findings.  However, patient CT PE repeat scan shows previous saddle embolus has migrated distally into the right main PA with occlusion of right pulmonary branches thus will admit to ICU for closer observation, heparin  drip and oxygen support. IR aware of this patient, agree with plan and will evaluate for possible intervention. Patient seen in the ER with daughters bedside.   Pertinent  Medical History   has a past medical history of Allergic rhinitis, cause unspecified, Anxiety, Arthritis, Asthma, Benign neoplasm of colon, Bursitis, CHF (congestive heart failure) (HCC), Cirrhosis (HCC), Complication of anesthesia, Cyst and pseudocyst of pancreas, Depressive disorder, not elsewhere classified, Diabetes mellitus without complication (HCC), Diverticulitis, Diverticulosis of colon (without mention of hemorrhage), Dyspnea, Dysrhythmia, Esophageal reflux, Essential and other specified forms of tremor, History of GI bleed, History of kidney stones, Irritable bowel syndrome, Obesity, unspecified, Other specified cardiac dysrhythmias(427.89), Periapical abscess without sinus, Polyneuropathy in diabetes(357.2), PONV (postoperative nausea and vomiting), Pure hypercholesterolemia, Renal cell carcinoma (HCC), Sleep apnea, and Type II or unspecified type diabetes mellitus with neurological manifestations, not stated as uncontrolled(250.60).   Significant Hospital  Events: Including procedures, antibiotic start and stop dates in addition to other pertinent events     Interim History / Subjective:  8/13 - admit for acute PE  Objective    Blood pressure (!) 110/56, pulse 66, temperature (!) 97.2 F (36.2 C), temperature source Axillary, resp. rate 14, height 5' 3 (1.6 m), weight 83.4 kg, SpO2 99%.        Intake/Output Summary (Last 24 hours) at 03/03/2024 9173 Last data filed at 03/03/2024 0700 Gross per 24 hour  Intake 1177.51 ml  Output --  Net 1177.51 ml   Filed Weights   03/02/24 1730 03/03/24 0248  Weight: 83 kg 83.4 kg    Examination: General: awake , alert HENT: normal ears, moist mucous membranes Lungs: symmetric expansion, no wheezing Cardiovascular: RRR, no murmur Abdomen: soft, nondistended Extremities: no edema, no cyanosis Neuro: CNII to CNXII grossly intact, no sensory deficits    Resolved problem list   Assessment and Plan   Acute saddle PE - The previously identified saddle embolus has moved distally into the right main pulmonary artery, causing near-complete blockage at the origins of the right upper lobar and right middle lobar pulmonary arteries, with further extension into the right lower lobar pulmonary artery.  - ECHO on 02/27/2024 showed normal LVEF with no regional wall motion abnormalities  - BNP and troponin levels are negative.  - Echocardiogram and lower extremity duplex ultrasound are pending. -  IR has review patient and considering aspiration thrombectomy  - We will continue to monitor and evaluate for other potential causes of pulmonary embolism, although her history of renal cell carcinoma likely contributes to a hypercoagulable state.  - Heparin  infusion will be maintained today.  Will need to discuss AC adherence and man need to consider IVF filter if recurrent DVTs despite AC   2. Orthostatic hypotension  Recurrent Falls - trauma survey complete - Fall precautions  - PT/OT    3. CKD STAGE  3a - Serum Cr stable at 1.26 - Will continue to monitor - Otherwise stable electrolytes     3. T2DM She uses insulin  pump at home . Hgb A1c 4 months ago was not at goal of 7.9. Blood glucose is 186 this morning. Her insulin  pump is not in place at this time. Will continue glucose monitoring while she is hospitalized and initiate moderate SSI  - On home Lispro 100 units  - Moderate SSI - Give 30 units of semglee  Daily   5. GERD - Resume home Protonix  40 mg when able   6. Anxiety - Resume home Hydroxyzine  when able   7. Back pain/chronic debility  - On home hydrocodone - acetaminophen  325 BID  - NPO before IR evaluates - Can give IV dilaudid  for breakthrough pain   -    Best Practice (right click and Reselect all SmartList Selections daily)   Diet/type: NPO DVT prophylaxis systemic heparin  Pressure ulcer(s): N/A GI prophylaxis: H2B Lines: N/A Foley:  N/A Code Status:  full code Last date of multidisciplinary goals of care discussion [03/03/24]  Labs   CBC: Recent Labs  Lab 02/26/24 2002 02/27/24 0456 03/01/24 1458 03/02/24 1733 03/03/24 0345  WBC 3.0* 3.9* 10.8* 8.4 6.4  NEUTROABS  --   --  7.2 5.0  --   HGB 13.9 12.6 15.0 14.4 14.0  HCT 43.4 38.1 44.6 44.0 42.0  MCV 91.4 91.8 89.2 92.6 91.1  PLT 126* 121* 201 175 142*    Basic Metabolic Panel: Recent Labs  Lab 02/26/24 2002 02/28/24 0232 03/01/24 1458 03/02/24 1733 03/03/24 0345  NA 132* 138 142 139 138  K 4.6 3.8 3.4* 3.6 4.4  CL 99 102 103 101 101  CO2 20* 26 32 25 26  GLUCOSE 431* 111* 49* 65* 225*  BUN 17 29* 28* 25* 23  CREATININE 1.21* 1.22* 1.30* 1.31* 1.26*  CALCIUM 9.3 9.1 9.2 9.1 8.8*  MG  --   --   --   --  1.5*  PHOS  --   --   --   --  4.3   GFR: Estimated Creatinine Clearance: 37.6 mL/min (A) (by C-G formula based on SCr of 1.26 mg/dL (H)). Recent Labs  Lab 02/27/24 0456 03/01/24 1458 03/02/24 1733 03/03/24 0345  WBC 3.9* 10.8* 8.4 6.4    Liver Function Tests: Recent  Labs  Lab 02/26/24 2002 03/01/24 1458 03/02/24 1733  AST 59* 42* 42*  ALT 41 34 31  ALKPHOS 250* 205* 177*  BILITOT 2.0* 1.1 1.0  PROT 6.7 6.5 5.8*  ALBUMIN 3.3* 3.7 3.1*   No results for input(s): LIPASE, AMYLASE in the last 168 hours. No results for input(s): AMMONIA in the last 168 hours.  ABG No results found for: PHART, PCO2ART, PO2ART, HCO3, TCO2, ACIDBASEDEF, O2SAT   Coagulation Profile: No results for input(s): INR, PROTIME in the last 168 hours.  Cardiac Enzymes: No results for input(s): CKTOTAL, CKMB, CKMBINDEX, TROPONINI in the last 168 hours.  HbA1C: Hemoglobin A1C  Date/Time Value Ref Range Status  10/08/2018 12:00 AM 8.4  Final   Hgb A1c MFr Bld  Date/Time Value Ref Range Status  10/19/2023 04:03 AM 7.9 (H) 4.8 - 5.6 % Final    Comment:    (NOTE) Pre diabetes:          5.7%-6.4%  Diabetes:              >  6.4%  Glycemic control for   <7.0% adults with diabetes   08/05/2022 01:40 PM 7.9 (H) 4.8 - 5.6 % Final    Comment:    (NOTE) Pre diabetes:          5.7%-6.4%  Diabetes:              >6.4%  Glycemic control for   <7.0% adults with diabetes     CBG: Recent Labs  Lab 02/28/24 1154 03/02/24 1910 03/02/24 2034 03/03/24 0216 03/03/24 0715  GLUCAP 274* 60* 154* 186* 206*    Review of Systems:   14 point ROS completed and negative except what is stated in the HPI  Past Medical History:  She,  has a past medical history of Allergic rhinitis, cause unspecified, Anxiety, Arthritis, Asthma, Benign neoplasm of colon, Bursitis, CHF (congestive heart failure) (HCC), Cirrhosis (HCC), Complication of anesthesia, Cyst and pseudocyst of pancreas, Depressive disorder, not elsewhere classified, Diabetes mellitus without complication (HCC), Diverticulitis, Diverticulosis of colon (without mention of hemorrhage), Dyspnea, Dysrhythmia, Esophageal reflux, Essential and other specified forms of tremor, History of GI bleed,  History of kidney stones, Irritable bowel syndrome, Obesity, unspecified, Other specified cardiac dysrhythmias(427.89), Periapical abscess without sinus, Polyneuropathy in diabetes(357.2), PONV (postoperative nausea and vomiting), Pure hypercholesterolemia, Renal cell carcinoma (HCC), Sleep apnea, and Type II or unspecified type diabetes mellitus with neurological manifestations, not stated as uncontrolled(250.60).   Surgical History:   Past Surgical History:  Procedure Laterality Date   ABDOMINAL HYSTERECTOMY     CALCANEAL OSTEOTOMY Right 07/27/2015   Procedure: RIGHT CALCANEAL OSTEOTOMY ;  Surgeon: Norleen Armor, MD;  Location: [facility];  Service: Orthopedics;  Laterality: Right;   CHOLECYSTECTOMY     15-20 years ago   COLONOSCOPY WITH PROPOFOL  N/A 08/17/2019   Procedure: COLONOSCOPY WITH PROPOFOL ;  Surgeon: Rollin Dover, MD;  Location: [facility] ENDOSCOPY;  Service: Endoscopy;  Laterality: N/A;   COLONOSCOPY WITH PROPOFOL  N/A 06/21/2022   Procedure: COLONOSCOPY WITH PROPOFOL ;  Surgeon: Rollin Dover, MD;  Location: [facility] ENDOSCOPY;  Service: Gastroenterology;  Laterality: N/A;   ESOPHAGOGASTRODUODENOSCOPY (EGD) WITH PROPOFOL  N/A 08/17/2019   Procedure: ESOPHAGOGASTRODUODENOSCOPY (EGD) WITH PROPOFOL ;  Surgeon: Rollin Dover, MD;  Location: [facility] ENDOSCOPY;  Service: Endoscopy;  Laterality: N/A;   GASTROCNEMIUS RECESSION Right 07/27/2015   Procedure: RIGHT GASTROC RECESSION;  Surgeon: Norleen Armor, MD;  Location: [facility];  Service: Orthopedics;  Laterality: Right;   KIDNEY STONE SURGERY  1980's   removal   LITHOTRIPSY     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   PARTIAL HYSTERECTOMY  1990's   POLYPECTOMY  08/17/2019   Procedure: POLYPECTOMY;  Surgeon: Rollin Dover, MD;  Location: [facility] ENDOSCOPY;  Service: Endoscopy;;   POLYPECTOMY  06/21/2022   Procedure: POLYPECTOMY;  Surgeon: Rollin Dover, MD;  Location: [facility];  Service: Gastroenterology;;   ROBOT ASSISTED LAPAROSCOPIC NEPHRECTOMY Left  08/14/2022   Procedure: XI ROBOTIC ASSISTED LAPAROSCOPIC NEPHRECTOMY;  Surgeon: Devere Lonni Righter, MD;  Location: [facility];  Service: Urology;  Laterality: Left;   ROTATOR CUFF REPAIR  2005   XI ROBOTIC LAPAROSCOPIC ASSISTED APPENDECTOMY N/A 08/14/2022   Procedure: XI ROBOTIC LAPAROSCOPIC ASSISTED APPENDECTOMY;  Surgeon: Debby Hila, MD;  Location: [facility];  Service: General;  Laterality: N/A;     Social History:   reports that she has never smoked. She has never used smokeless tobacco. She reports that she does not drink alcohol and does not use drugs.   Family History:  Her family history includes Allergies in an other family member;  Asthma in an other family member; Cancer in an other family member; Cirrhosis in her father; Clotting disorder in her mother; Diabetes in an other family member.   Allergies Allergies  Allergen Reactions   Codeine  Other (See Comments)    Unknown    Doxycycline  Hives   Epinephrine  Hcl Other (See Comments)    Had tremors when it was given.   Erythromycin Ethylsuccinate Other (See Comments)    Abdominal spasms   Eucalyptus Oil Other (See Comments)    Unknown    Iohexol  Hives    Pt needs premedicated-- hives on prev contrast study per md office    Lidocaine  Other (See Comments)    Tremors (lasted months)   Menthol Other (See Comments)    Unknown    Metformin  Diarrhea   Oxycodone  Other (See Comments)    felt like I was dying   Tape Rash     Home Medications  Prior to Admission medications   Medication Sig Start Date End Date Taking? Authorizing Provider  albuterol  (PROAIR  HFA) 108 (90 Base) MCG/ACT inhaler Inhale 2 puffs into the lungs every 6 (six) hours as needed for wheezing or shortness of breath. 06/13/23   Bedsole, Amy E, MD  apixaban  (ELIQUIS ) 5 MG TABS tablet Take 2 tablets (10 mg total) by mouth 2 (two) times daily for 6 days, THEN 1 tablet (5 mg total) 2 (two) times daily. 02/28/24 05/04/24  Jillian Buttery, MD  atenolol  (TENORMIN )  25 MG tablet TAKE 1 TABLET (25 MG TOTAL) BY MOUTH DAILY. 01/19/24   Court Dorn PARAS, MD  cetirizine (ZYRTEC) 10 MG tablet Take 10 mg by mouth daily.    [provider]  Coenzyme Q10 100 MG TABS Take 100 mg by mouth daily.    [provider]  CRANBERRY PO Take 1,000 mg by mouth in the morning and at bedtime.    [provider]  D-MANNOSE PO Take 4,000 mg by mouth in the morning and at bedtime.    [provider]  DULoxetine  (CYMBALTA ) 60 MG capsule Take 1 capsule (60 mg total) by mouth daily. 07/11/23   Bedsole, Amy E, MD  furosemide  (LASIX ) 40 MG tablet Take 40 mg by mouth daily.    [provider]  guaiFENesin  (MUCINEX ) 600 MG 12 hr tablet Take 1 tablet (600 mg total) by mouth 2 (two) times daily for 7 days. 02/28/24 03/06/24  Jillian Buttery, MD  HYDROcodone -acetaminophen  (NORCO/VICODIN) 5-325 MG tablet Take 1 tablet by mouth in the morning and at bedtime. 02/16/24   [provider]  hydrOXYzine  (ATARAX ) 25 MG tablet Take 25 mg by mouth 2 (two) times daily as needed for anxiety.    [provider]  insulin  lispro (HUMALOG) 100 UNIT/ML injection Inject into the skin 3 (three) times daily before meals.    [provider]  nitrofurantoin , macrocrystal-monohydrate, (MACROBID ) 100 MG capsule Take 100 mg by mouth 2 (two) times daily.    [provider]  pantoprazole  (PROTONIX ) 40 MG tablet TAKE 1 TABLET BY MOUTH EVERY DAY 09/17/19   Wendling, Mabel Mt, DO  polyethylene glycol (MIRALAX  / GLYCOLAX ) 17 g packet Take 17 g by mouth daily as needed for mild constipation. 06/09/23   Hongalgi, Anand D, MD  predniSONE  (DELTASONE ) 10 MG tablet Take 10 mg by mouth daily with breakfast.    [provider]  promethazine  (PHENERGAN ) 12.5 MG tablet Take 12.5 mg by mouth 2 (two) times daily as needed for nausea. 02/16/24   [provider]  traMADol  (ULTRAM ) 50 MG tablet Take 1 tablet (50 mg total) by mouth every 8 (eight)  hours as needed for severe pain (pain score 7-10). 10/21/23   Amin, Ankit C, MD  UNABLE TO FIND Take 1 tablet by mouth in the morning and at bedtime. Med Name: Korene NOVAK Vitamin    [provider]     Critical care time: 60 minutes

## 2024-03-03 NOTE — ED Provider Notes (Signed)
 Signed out to me by Dr. Albertina to follow CT results.  Patient felt to be experiencing syncopal episodes.  She was just hospitalized with saddle PE.  CT angio performed to evaluate for worsening clot burden and heart strain.  Patient required premedication due to IV contrast allergy.  Physical Exam  BP 107/75   Pulse 68   Temp 97.7 F (36.5 C) (Temporal)   Resp 19   Ht 5' 3 (1.6 m)   Wt 83 kg   SpO2 95%   BMI 32.42 kg/m   Physical Exam HENT:     Head: Contusion present.  Cardiovascular:     Rate and Rhythm: Normal rate and regular rhythm.  Pulmonary:     Effort: Pulmonary effort is normal.     Breath sounds: Normal breath sounds.  Musculoskeletal:        General: Normal range of motion.  Skin:    General: Skin is warm and dry.     Findings: Bruising present.  Neurological:     General: No focal deficit present.     Mental Status: She is oriented to person, place, and time.     Procedures  .Critical Care  Performed by: Haze Lonni PARAS, MD Authorized by: Haze Lonni PARAS, MD   Critical care provider statement:    Critical care time (minutes):  30   Critical care was time spent personally by me on the following activities:  Development of treatment plan with patient or surrogate, discussions with consultants, evaluation of patient's response to treatment, examination of patient, ordering and review of laboratory studies, ordering and review of radiographic studies, ordering and performing treatments and interventions, pulse oximetry, re-evaluation of patient's condition and review of old charts   I assumed direction of critical care for this patient from another provider in my specialty: yes     Care discussed with: admitting provider     ED Course / MDM    Medical Decision Making Amount and/or Complexity of Data Reviewed Labs:  Decision-making details documented in ED Course. Radiology: independent interpretation performed. Decision-making details  documented in ED Course. ECG/medicine tests: ordered.  Risk Prescription drug management. Decision regarding hospitalization.   CT scan has been performed.  Patient has had migration of the clot into the right main pulmonary artery.  Fortunately, patient remains hemodynamically stable.  Blood pressures are normal, no significant shortness of breath or oxygen requirement.  Troponin is not elevated, right heart strain unchanged from prior.  Discussed with Dr. Tobie, critical care.  Patient will be admitted to the ICU on the critical care service.  Discussed with Dr. Jennefer, on-call for interventional radiology.  He will review images and patient will be considered for possible thrombectomy.       Haze Lonni PARAS, MD 03/03/24 [phone]

## 2024-03-03 NOTE — Sedation Documentation (Signed)
 ACT 285

## 2024-03-04 ENCOUNTER — Inpatient Hospital Stay (HOSPITAL_COMMUNITY)

## 2024-03-04 DIAGNOSIS — K219 Gastro-esophageal reflux disease without esophagitis: Secondary | ICD-10-CM

## 2024-03-04 DIAGNOSIS — I2602 Saddle embolus of pulmonary artery with acute cor pulmonale: Secondary | ICD-10-CM | POA: Diagnosis not present

## 2024-03-04 DIAGNOSIS — E119 Type 2 diabetes mellitus without complications: Secondary | ICD-10-CM | POA: Diagnosis not present

## 2024-03-04 DIAGNOSIS — R55 Syncope and collapse: Secondary | ICD-10-CM | POA: Diagnosis not present

## 2024-03-04 LAB — CBC
HCT: 38 % (ref 36.0–46.0)
Hemoglobin: 12.4 g/dL (ref 12.0–15.0)
MCH: 30.2 pg (ref 26.0–34.0)
MCHC: 32.6 g/dL (ref 30.0–36.0)
MCV: 92.7 fL (ref 80.0–100.0)
Platelets: 165 K/uL (ref 150–400)
RBC: 4.1 MIL/uL (ref 3.87–5.11)
RDW: 13.7 % (ref 11.5–15.5)
WBC: 12 K/uL — ABNORMAL HIGH (ref 4.0–10.5)
nRBC: 0 % (ref 0.0–0.2)

## 2024-03-04 LAB — ECHOCARDIOGRAM LIMITED
AR max vel: 3.2 cm2
AV Area VTI: 3.35 cm2
AV Area mean vel: 3.15 cm2
AV Mean grad: 4 mmHg
AV Peak grad: 7.6 mmHg
Ao pk vel: 1.38 m/s
Area-P 1/2: 2.76 cm2
Height: 63 in
S' Lateral: 2.3 cm
Weight: 3167.57 [oz_av]

## 2024-03-04 LAB — RENAL FUNCTION PANEL
Albumin: 2.6 g/dL — ABNORMAL LOW (ref 3.5–5.0)
Anion gap: 9 (ref 5–15)
BUN: 25 mg/dL — ABNORMAL HIGH (ref 8–23)
CO2: 28 mmol/L (ref 22–32)
Calcium: 8.6 mg/dL — ABNORMAL LOW (ref 8.9–10.3)
Chloride: 98 mmol/L (ref 98–111)
Creatinine, Ser: 1.25 mg/dL — ABNORMAL HIGH (ref 0.44–1.00)
GFR, Estimated: 44 mL/min — ABNORMAL LOW (ref >60)
Glucose, Bld: 401 mg/dL — ABNORMAL HIGH (ref 70–99)
Phosphorus: 4.1 mg/dL (ref 2.5–4.6)
Potassium: 4.5 mmol/L (ref 3.5–5.1)
Sodium: 135 mmol/L (ref 135–145)

## 2024-03-04 LAB — APTT: aPTT: >200 s (ref 24–36)

## 2024-03-04 LAB — GLUCOSE, CAPILLARY
Glucose-Capillary: 297 mg/dL — ABNORMAL HIGH (ref 70–99)
Glucose-Capillary: 89 mg/dL (ref 70–99)
Glucose-Capillary: 215 mg/dL — ABNORMAL HIGH (ref 70–99)
Glucose-Capillary: 357 mg/dL — ABNORMAL HIGH (ref 70–99)
Glucose-Capillary: 267 mg/dL — ABNORMAL HIGH (ref 70–99)

## 2024-03-04 MED ORDER — HEPARIN (PORCINE) 25000 UT/250ML-% IV SOLN
850.0000 [IU]/h | INTRAVENOUS | Status: DC
  Administered 2024-03-04: 850 [IU]/h via INTRAVENOUS
  Filled 2024-03-04: qty 250

## 2024-03-04 MED ORDER — HYDROXYZINE HCL 25 MG PO TABS
25.0000 mg | ORAL_TABLET | Freq: Two times a day (BID) | ORAL | Status: DC | PRN
  Administered 2024-03-04 – 2024-03-07 (×3): 25 mg via ORAL
  Filled 2024-03-04 (×4): qty 1

## 2024-03-04 MED ORDER — APIXABAN 5 MG PO TABS: 5.0000 mg | ORAL_TABLET | Freq: Two times a day (BID) | ORAL | Status: DC

## 2024-03-04 MED ORDER — INSULIN ASPART 100 UNIT/ML IJ SOLN
0.0000 [IU] | Freq: Three times a day (TID) | INTRAMUSCULAR | Status: DC
  Administered 2024-03-04: 5 [IU] via SUBCUTANEOUS
  Administered 2024-03-04: 15 [IU] via SUBCUTANEOUS
  Administered 2024-03-05 (×2): 8 [IU] via SUBCUTANEOUS
  Administered 2024-03-05: 5 [IU] via SUBCUTANEOUS
  Administered 2024-03-06: 3 [IU] via SUBCUTANEOUS
  Administered 2024-03-06: 5 [IU] via SUBCUTANEOUS
  Administered 2024-03-07: 8 [IU] via SUBCUTANEOUS
  Administered 2024-03-07 (×2): 3 [IU] via SUBCUTANEOUS
  Administered 2024-03-08: 5 [IU] via SUBCUTANEOUS
  Administered 2024-03-08: 3 [IU] via SUBCUTANEOUS

## 2024-03-04 MED ORDER — DULOXETINE HCL 60 MG PO CPEP
60.0000 mg | ORAL_CAPSULE | Freq: Every day | ORAL | Status: DC
  Administered 2024-03-04 – 2024-03-08 (×5): 60 mg via ORAL
  Filled 2024-03-04 (×3): qty 1
  Filled 2024-03-04: qty 2
  Filled 2024-03-04: qty 1

## 2024-03-04 MED ORDER — APIXABAN 5 MG PO TABS
10.0000 mg | ORAL_TABLET | Freq: Two times a day (BID) | ORAL | Status: DC
  Administered 2024-03-04 – 2024-03-08 (×8): 10 mg via ORAL
  Filled 2024-03-04 (×9): qty 2

## 2024-03-04 NOTE — Plan of Care (Signed)
  Problem: Education: Goal: Knowledge of General Education information will improve Description: Including pain rating scale, medication(s)/side effects and non-pharmacologic comfort measures Outcome: Progressing   Problem: Clinical Measurements: Goal: Ability to maintain clinical measurements within normal limits will improve Outcome: Progressing Goal: Cardiovascular complication will be avoided Outcome: Progressing   Problem: Clinical Measurements: Goal: Cardiovascular complication will be avoided Outcome: Progressing   Problem: Coping: Goal: Level of anxiety will decrease Outcome: Progressing   Problem: Metabolic: Goal: Ability to maintain appropriate glucose levels will improve Outcome: Not Progressing   Problem: Nutritional: Goal: Maintenance of adequate nutrition will improve Outcome: Progressing

## 2024-03-04 NOTE — Inpatient Diabetes Management (Addendum)
 Inpatient Diabetes Program Recommendations  AACE/ADA: New Consensus Statement on Inpatient Glycemic Control (2015)  Target Ranges:  Prepandial:   less than 140 mg/dL      Peak postprandial:   less than 180 mg/dL (1-2 hours)      Critically ill patients:  140 - 180 mg/dL   Lab Results  Component Value Date   GLUCAP 89 03/04/2024   HGBA1C 8.1 (H) 03/03/2024    Review of Glycemic Control  Latest Reference Range & Units 03/03/24 07:15 03/03/24 11:10 03/03/24 16:37 03/03/24 19:18 03/03/24 23:20 03/04/24 03:26 03/04/24 07:29  Glucose-Capillary 70 - 99 mg/dL 793 (H) 820 (H) 787 (H) 219 (H) 365 (H) 297 (H) 89  (H): Data is abnormally high  Diabetes history: DM2 Outpatient Diabetes medications: Insulin  pump-Medtronic Current orders for Inpatient glycemic control: Novolog  0-15 units Q4H  Inpatient Diabetes Program Recommendations:    Semglee  25 units every day Novolog  0-9 units TID and 0-5 units at bedtime  Addendum@1135 : Met with Ms. McGraw at bedside.  She has a new Medtronic Insulin  pump at home that has not been set up yet.  She has the [employer] CGM that goes with this pump.  She has been off of her pump for 1 month.  She is nervous about starting her new pump because she has a hard time understanding exactly how to use it.  If she has difficulty understanding the pump she should consider more training or sticking with [facility].  She states [facility]I does not keep her well controlled either and her children have to come help her often.  She is current with Dr. Tommas; asked her to speak with Dr. Tommas regarding her concerns.   Thank you, Wyvonna Pinal, MSN, [facility] Diabetes Coordinator Inpatient Diabetes Program [phone] (team pager from 8a-5p)   Thank you, Wyvonna Pinal, MSN, [facility] Diabetes Coordinator Inpatient Diabetes Program [phone] (team pager from 8a-5p)

## 2024-03-04 NOTE — Progress Notes (Signed)
 Referring Physician(s): Dr. Paula Southerly   Supervising Physician: Jennefer Rover  Patient Status:  [facility] - In-pt  Chief Complaint: Bilateral DVT with PE s/p PE thrombectomy with placement of IVC filter 03/03/24 with Dr. Jennefer   Subjective: Patient alert and oriented, resting comfortably in bed. She denies pain or discomfort but is having a lot of dizziness.   Allergies: Auvi-q  [epinephrine ], Codeine , E.e.s. [erythromycin], Eucalyptus oil, Glucophage  [metformin ], Iodinated contrast media, Iohexol , Lortab [hydrocodone -acetaminophen ], Menthol, Roxicodone  [oxycodone ], Tylenol  [acetaminophen ], Vibramycin  [doxycycline ], Xylocaine  [lidocaine ], and Tape  Medications: Prior to Admission medications   Medication Sig Start Date End Date Taking? Authorizing Provider  albuterol  (PROAIR  HFA) 108 (90 Base) MCG/ACT inhaler Inhale 2 puffs into the lungs every 6 (six) hours as needed for wheezing or shortness of breath. 06/13/23  Yes Bedsole, Amy E, MD  apixaban  (ELIQUIS ) 5 MG TABS tablet Take 2 tablets (10 mg total) by mouth 2 (two) times daily for 6 days, THEN 1 tablet (5 mg total) 2 (two) times daily. 02/28/24 05/04/24 Yes Adhikari, Ivonne, MD  atenolol  (TENORMIN ) 25 MG tablet TAKE 1 TABLET (25 MG TOTAL) BY MOUTH DAILY. 01/19/24  Yes Court Dorn PARAS, MD  cetirizine (ZYRTEC) 10 MG tablet Take 10 mg by mouth daily.   Yes [provider]  Coenzyme Q10 (COQ10 PO) Take 1 capsule by mouth daily.   Yes [provider]  CRANBERRY PO Take 1 capsule by mouth daily.   Yes [provider]  D-MANNOSE PO Take 2 tablets by mouth daily.   Yes [provider]  DULoxetine  (CYMBALTA ) 60 MG capsule Take 1 capsule (60 mg total) by mouth daily. 07/11/23  Yes Bedsole, Amy E, MD  furosemide  (LASIX ) 40 MG tablet Take 40 mg by mouth daily.   Yes [provider]  guaiFENesin  (MUCINEX ) 600 MG 12 hr tablet Take 1 tablet (600 mg total) by mouth 2 (two) times daily for 7 days. 02/28/24  03/06/24 Yes Jillian Ivonne, MD  HYDROcodone -acetaminophen  (NORCO/VICODIN) 5-325 MG tablet Take 1 tablet by mouth 2 (two) times daily as needed for severe pain (pain score 7-10). 02/16/24  Yes [provider]  hydrOXYzine  (ATARAX ) 25 MG tablet Take 25 mg by mouth 2 (two) times daily as needed for anxiety.   Yes [provider]  insulin  lispro (HUMALOG) 100 UNIT/ML injection Inject 100 Units into the skin See admin instructions. Max daily dose 100 units, administered continuously via pump.   Yes [provider]  pantoprazole  (PROTONIX ) 40 MG tablet TAKE 1 TABLET BY MOUTH EVERY DAY 09/17/19  Yes Frann Mabel Mt, DO  predniSONE  (DELTASONE ) 10 MG tablet Take 10 mg by mouth daily with breakfast.   Yes [provider]  pregabalin  (LYRICA ) 100 MG capsule Take 100 mg by mouth 2 (two) times daily as needed (neuropathy).   Yes [provider]  promethazine  (PHENERGAN ) 12.5 MG tablet Take 12.5 mg by mouth 2 (two) times daily as needed for nausea. 02/16/24  Yes [provider]  traMADol  (ULTRAM ) 50 MG tablet Take 1 tablet (50 mg total) by mouth every 8 (eight) hours as needed for severe pain (pain score 7-10). Patient taking differently: Take 50 mg by mouth 2 (two) times daily as needed for severe pain (pain score 7-10). 10/21/23  Yes Amin, Ankit C, MD  nitrofurantoin , macrocrystal-monohydrate, (MACROBID ) 100 MG capsule Take 100 mg by mouth 2 (two) times daily. Patient not taking: Reported on 03/04/2024    [provider]     Vital Signs: BP ROLLEN)  128/91 (BP Location: Right Arm)   Pulse 80   Temp 98.6 F (37 C) (Oral)   Resp 16   Ht 5' 3 (1.6 m)   Wt 197 lb 15.6 oz (89.8 kg)   SpO2 100%   BMI 35.07 kg/m   Physical Exam Constitutional:      General: She is not in acute distress.    Appearance: She is not ill-appearing.  HENT:     Head:     Comments: Various bruises/scabs on face from falling.  Cardiovascular:     Rate and Rhythm:  Normal rate.     Comments: Right groin vascular site is clean, soft and dry. Mild tenderness to palpation. Dressing is clean/dry.  Pulmonary:     Effort: Pulmonary effort is normal.  Abdominal:     Tenderness: There is no abdominal tenderness.  Musculoskeletal:     Right lower leg: No edema.     Left lower leg: No edema.  Skin:    General: Skin is warm and dry.  Neurological:     Mental Status: She is alert and oriented to person, place, and time.      Labs:  CBC: Recent Labs    03/01/24 1458 03/02/24 1733 03/03/24 0345 03/04/24 0037  WBC 10.8* 8.4 6.4 12.0*  HGB 15.0 14.4 14.0 12.4  HCT 44.6 44.0 42.0 38.0  PLT 201 175 142* 165    COAGS: Recent Labs    03/03/24 0932 03/04/24 0037  APTT >200* >200*    BMP: Recent Labs    03/01/24 1458 03/02/24 1733 03/03/24 0345 03/04/24 0037  NA 142 139 138 135  K 3.4* 3.6 4.4 4.5  CL 103 101 101 98  CO2 32 25 26 28   GLUCOSE 49* 65* 225* 401*  BUN 28* 25* 23 25*  CALCIUM 9.2 9.1 8.8* 8.6*  CREATININE 1.30* 1.31* 1.26* 1.25*  GFRNONAA 42* 42* 44* 44*    LIVER FUNCTION TESTS: Recent Labs    11/18/23 1202 02/26/24 2002 03/01/24 1458 03/02/24 1733 03/04/24 0037  BILITOT 1.2 2.0* 1.1 1.0  --   AST 47* 59* 42* 42*  --   ALT 60* 41 34 31  --   ALKPHOS 197* 250* 205* 177*  --   PROT 6.4 6.7 6.5 5.8*  --   ALBUMIN 3.6 3.3* 3.7 3.1* 2.6*    Assessment and Plan:  Bilateral DVT with PE s/p PE thrombectomy with placement of IVC filter 03/03/24 with Dr. Jennefer   The patient was transferred out of the ICU today. She is on 2 L oxygen and is feeling well with the exception of some dizziness. Her right femoral vascular site is clean, soft, dry and minimally tender to palpation. The gauze/tegaderm dressing may be removed tomorrow. Labs and vitals are within acceptable limits.  The patient will follow up with with Dr. Jennefer in approximately one month for a CTV abdomen/pelvis and bilateral lower extremity duplex. An order  has been placed for a scheduler from our office to call her with a date/time of her appointments.   Electronically Signed: Warren Dais, AGACNP-BC 03/04/2024, 2:50 PM   I spent a total of 15 Minutes at the the patient's bedside AND on the patient's hospital floor or unit, greater than 50% of which was counseling/coordinating care for DVT/PE

## 2024-03-04 NOTE — Plan of Care (Signed)
  Problem: Education: Goal: Knowledge of General Education information will improve Description: Including pain rating scale, medication(s)/side effects and non-pharmacologic comfort measures Outcome: Progressing   Problem: Clinical Measurements: Goal: Cardiovascular complication will be avoided Outcome: Progressing   Problem: Activity: Goal: Risk for activity intolerance will decrease Outcome: Progressing   Problem: Nutrition: Goal: Adequate nutrition will be maintained Outcome: Progressing   Problem: Coping: Goal: Level of anxiety will decrease Outcome: Progressing   Problem: Safety: Goal: Ability to remain free from injury will improve Outcome: Progressing   Problem: Skin Integrity: Goal: Risk for impaired skin integrity will decrease Outcome: Progressing

## 2024-03-04 NOTE — Progress Notes (Signed)
 Pt transferred to room 5N15 from 2M08. Alert, on 2L [place]

## 2024-03-04 NOTE — Progress Notes (Signed)
 NAMEKiley [name], MRN:  [id number], DOB:  1946/06/17, LOS: 1 ADMISSION DATE:  03/02/2024, CONSULTATION DATE:  03/03/24  REFERRING MD:  ER doc, CHIEF COMPLAINT:  Fall   History of Present Illness:   78yoF patient h/o RCC s/p nephrectomy, T2DM, CHF, anxiety, GERD, recent hospitalization for an acute saddle PE nonoperable at that time along with bilateral LE DVT discharged on eliquis  now here with level 2 trauma alert for ground level fall. Patient has history of multiple falls and orthostatic hypotension.  Trauma survey negative for any acute findings.  However, patient CT PE repeat scan shows previous saddle embolus has migrated distally into the right main PA with occlusion of right pulmonary branches thus will admit to ICU for closer observation, heparin  drip and oxygen support. IR aware of this patient, agree with plan and will evaluate for possible intervention. Patient seen in the ER with daughters bedside.   Pertinent  Medical History   has a past medical history of Allergic rhinitis, cause unspecified, Anxiety, Arthritis, Asthma, Benign neoplasm of colon, Bursitis, CHF (congestive heart failure) (HCC), Cirrhosis (HCC), Complication of anesthesia, Cyst and pseudocyst of pancreas, Depressive disorder, not elsewhere classified, Diabetes mellitus without complication (HCC), Diverticulitis, Diverticulosis of colon (without mention of hemorrhage), Dyspnea, Dysrhythmia, Esophageal reflux, Essential and other specified forms of tremor, History of GI bleed, History of kidney stones, Irritable bowel syndrome, Obesity, unspecified, Other specified cardiac dysrhythmias(427.89), Periapical abscess without sinus, Polyneuropathy in diabetes(357.2), PONV (postoperative nausea and vomiting), Pure hypercholesterolemia, Renal cell carcinoma (HCC), Sleep apnea, and Type II or unspecified type diabetes mellitus with neurological manifestations, not stated as uncontrolled(250.60).   Significant Hospital  Events: Including procedures, antibiotic start and stop dates in addition to other pertinent events     Interim History / Subjective:  8/13 - admit for acute PE 8/13 : Underwent thrombectomy and IVC placement with IR   Objective    Blood pressure 106/73, pulse (!) 42, temperature (!) 96.7 F (35.9 C), temperature source Axillary, resp. rate 15, height 5' 3 (1.6 m), weight 89.8 kg, SpO2 99%.        Intake/Output Summary (Last 24 hours) at 03/04/2024 0719 Last data filed at 03/04/2024 0500 Gross per 24 hour  Intake 622.37 ml  Output 950 ml  Net -327.63 ml   Filed Weights   03/02/24 1730 03/03/24 0248 03/04/24 0500  Weight: 83 kg 83.4 kg 89.8 kg    Examination: General: awake , alert HENT: normal ears, moist mucous membranes Lungs: symmetric expansion, no wheezing Cardiovascular: RRR, no murmur Abdomen: soft, nondistended Extremities: no edema, no cyanosis Neuro: CNII to CNXII grossly intact, no sensory deficits    Resolved problem list   Assessment and Plan  CVS Acute saddle PE - The previously identified saddle embolus has moved distally into the right main pulmonary artery, causing near-complete blockage at the origins of the right upper lobar and right middle lobar pulmonary arteries, with further extension into the right lower lobar pulmonary artery.  - S/p thrombectomy and IVC placement with IR  03/03/2024 - ECHO on 02/27/2024 showed normal LVEF with no regional wall motion abnormalities ,pending repeat - LE doppler findings consistent with acute deep vein thrombosis involving the right  posterior tibial veins.  - BNP and troponin levels are negative.  - Heparin  infusion will be maintained today - F/U on ECHO results - Target MAP > 65  NEURO 2. Orthostatic hypotension     Recurrent Falls - Fall precautions  - PT/OT   RENAL 3.  CKD STAGE 3a - Serum Cr stable at 1.26 - Will continue to monitor - Otherwise stable electrolytes    ENDOCRINE 3. T2DM She uses  insulin  pump at home . Hgb A1c 4 months ago was not at goal of 7.9. - Blood glucose within range this morning - Diet Resumed  - Moderate SSI - Give 30 units of semglee  Daily   GI 5. GERD - Resume home Protonix  40 mg when able   PSYCH  6. Anxiety - Resume home Hydroxyzine  when able   MSK 7. Back pain/chronic debility  - On home hydrocodone - acetaminophen  325 BID   Dispo: No ICU needs at this time. Discuss potential downgrade while she awaits ECHO. Transition back to DOAC prior to discharge    Best Practice (right click and Reselect all SmartList Selections daily)   Diet/type: NPO DVT prophylaxis systemic heparin  Pressure ulcer(s): N/A GI prophylaxis: H2B Lines: N/A Foley:  N/A Code Status:  full code Last date of multidisciplinary goals of care discussion [03/03/24]  Labs   CBC: Recent Labs  Lab 02/27/24 0456 03/01/24 1458 03/02/24 1733 03/03/24 0345 03/04/24 0037  WBC 3.9* 10.8* 8.4 6.4 12.0*  NEUTROABS  --  7.2 5.0  --   --   HGB 12.6 15.0 14.4 14.0 12.4  HCT 38.1 44.6 44.0 42.0 38.0  MCV 91.8 89.2 92.6 91.1 92.7  PLT 121* 201 175 142* 165    Basic Metabolic Panel: Recent Labs  Lab 02/28/24 0232 03/01/24 1458 03/02/24 1733 03/03/24 0345 03/04/24 0037  NA 138 142 139 138 135  K 3.8 3.4* 3.6 4.4 4.5  CL 102 103 101 101 98  CO2 26 32 25 26 28   GLUCOSE 111* 49* 65* 225* 401*  BUN 29* 28* 25* 23 25*  CREATININE 1.22* 1.30* 1.31* 1.26* 1.25*  CALCIUM 9.1 9.2 9.1 8.8* 8.6*  MG  --   --   --  1.5*  --   PHOS  --   --   --  4.3 4.1   GFR: Estimated Creatinine Clearance: 39.5 mL/min (A) (by C-G formula based on SCr of 1.25 mg/dL (H)). Recent Labs  Lab 03/01/24 1458 03/02/24 1733 03/03/24 0345 03/04/24 0037  WBC 10.8* 8.4 6.4 12.0*    Liver Function Tests: Recent Labs  Lab 02/26/24 2002 03/01/24 1458 03/02/24 1733 03/04/24 0037  AST 59* 42* 42*  --   ALT 41 34 31  --   ALKPHOS 250* 205* 177*  --   BILITOT 2.0* 1.1 1.0  --   PROT 6.7 6.5  5.8*  --   ALBUMIN 3.3* 3.7 3.1* 2.6*   No results for input(s): LIPASE, AMYLASE in the last 168 hours. No results for input(s): AMMONIA in the last 168 hours.  ABG No results found for: PHART, PCO2ART, PO2ART, HCO3, TCO2, ACIDBASEDEF, O2SAT   Coagulation Profile: No results for input(s): INR, PROTIME in the last 168 hours.  Cardiac Enzymes: No results for input(s): CKTOTAL, CKMB, CKMBINDEX, TROPONINI in the last 168 hours.  HbA1C: Hemoglobin A1C  Date/Time Value Ref Range Status  10/08/2018 12:00 AM 8.4  Final   Hgb A1c MFr Bld  Date/Time Value Ref Range Status  03/03/2024 09:32 AM 8.1 (H) 4.8 - 5.6 % Final    Comment:    (NOTE)         Prediabetes: 5.7 - 6.4         Diabetes: >6.4         Glycemic control for adults with diabetes: <7.0  10/19/2023 04:03 AM 7.9 (H) 4.8 - 5.6 % Final    Comment:    (NOTE) Pre diabetes:          5.7%-6.4%  Diabetes:              >6.4%  Glycemic control for   <7.0% adults with diabetes     CBG: Recent Labs  Lab 03/03/24 1110 03/03/24 1637 03/03/24 1918 03/03/24 2320 03/04/24 0326  GLUCAP 179* 212* 219* 365* 297*    Review of Systems:   14 point ROS completed and negative except what is stated in the HPI  Past Medical History:  She,  has a past medical history of Allergic rhinitis, cause unspecified, Anxiety, Arthritis, Asthma, Benign neoplasm of colon, Bursitis, CHF (congestive heart failure) (HCC), Cirrhosis (HCC), Complication of anesthesia, Cyst and pseudocyst of pancreas, Depressive disorder, not elsewhere classified, Diabetes mellitus without complication (HCC), Diverticulitis, Diverticulosis of colon (without mention of hemorrhage), Dyspnea, Dysrhythmia, Esophageal reflux, Essential and other specified forms of tremor, History of GI bleed, History of kidney stones, Irritable bowel syndrome, Obesity, unspecified, Other specified cardiac dysrhythmias(427.89), Periapical abscess without sinus,  Polyneuropathy in diabetes(357.2), PONV (postoperative nausea and vomiting), Pure hypercholesterolemia, Renal cell carcinoma (HCC), Sleep apnea, and Type II or unspecified type diabetes mellitus with neurological manifestations, not stated as uncontrolled(250.60).   Surgical History:   Past Surgical History:  Procedure Laterality Date   ABDOMINAL HYSTERECTOMY     CALCANEAL OSTEOTOMY Right 07/27/2015   Procedure: RIGHT CALCANEAL OSTEOTOMY ;  Surgeon: Norleen Armor, MD;  Location: [facility];  Service: Orthopedics;  Laterality: Right;   CHOLECYSTECTOMY     15-20 years ago   COLONOSCOPY WITH PROPOFOL  N/A 08/17/2019   Procedure: COLONOSCOPY WITH PROPOFOL ;  Surgeon: Rollin Dover, MD;  Location: [facility] END[facility]COPY;  Service: Endoscopy;  Laterality: N/A;   COLONOSCOPY WITH PROPOFOL  N/A 06/21/2022   Procedure: COLONOSCOPY WITH PROPOFOL ;  Surgeon: Rollin Dover, MD;  Location: [facility];  Service: Gastroenterology;  Laterality: N/A;   ESOPHAGOGASTRODUODENOSCOPY (EGD) WITH PROPOFOL  N/A 08/17/2019   Procedure: ESOPHAGOGASTRODUODENOSCOPY (EGD) WITH PROPOFOL ;  Surgeon: Rollin Dover, MD;  Location: [facility];  Service: Endoscopy;  Laterality: N/A;   GASTROCNEMIUS RECESSION Right 07/27/2015   Procedure: RIGHT GASTROC RECESSION;  Surgeon: Norleen Armor, MD;  Location: [facility];  Service: Orthopedics;  Laterality: Right;   KIDNEY STONE SURGERY  1980's   removal   LITHOTRIPSY     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   PARTIAL HYSTERECTOMY  1990's   POLYPECTOMY  08/17/2019   Procedure: POLYPECTOMY;  Surgeon: Rollin Dover, MD;  Location: [facility] ENDOSCOPY;  Service: Endoscopy;;   POLYPECTOMY  06/21/2022   Procedure: POLYPECTOMY;  Surgeon: Rollin Dover, MD;  Location: [facility];  Service: Gastroenterology;;   ROBOT ASSISTED LAPAROSCOPIC NEPHRECTOMY Left 08/14/2022   Procedure: XI ROBOTIC ASSISTED LAPAROSCOPIC NEPHRECTOMY;  Surgeon: Devere Lonni Righter, MD;  Location: [facility];  Service: Urology;   Laterality: Left;   ROTATOR CUFF REPAIR  2005   XI ROBOTIC LAPAROSCOPIC ASSISTED APPENDECTOMY N/A 08/14/2022   Procedure: XI ROBOTIC LAPAROSCOPIC ASSISTED APPENDECTOMY;  Surgeon: Debby Hila, MD;  Location: [facility];  Service: General;  Laterality: N/A;     Social History:   reports that she has never smoked. She has never used smokeless tobacco. She reports that she does not drink alcohol and does not use drugs.   Family History:  Her family history includes Allergies in an other family member; Asthma in an other family member; Cancer in an other family member; Cirrhosis  in her father; Clotting disorder in her mother; Diabetes in an other family member.   Allergies Allergies  Allergen Reactions   Codeine  Other (See Comments)    Unknown    Doxycycline  Hives   Epinephrine  Hcl Other (See Comments)    Had tremors when it was given.   Erythromycin Ethylsuccinate Other (See Comments)    Abdominal spasms   Eucalyptus Oil Other (See Comments)    Unknown    Iohexol  Hives    Pt needs premedicated-- hives on prev contrast study per md office    Lidocaine  Other (See Comments)    Tremors (lasted months)   Menthol Other (See Comments)    Unknown    Metformin  Diarrhea   Oxycodone  Other (See Comments)    felt like I was dying   Tape Rash     Home Medications  Prior to Admission medications   Medication Sig Start Date End Date Taking? Authorizing Provider  albuterol  (PROAIR  HFA) 108 (90 Base) MCG/ACT inhaler Inhale 2 puffs into the lungs every 6 (six) hours as needed for wheezing or shortness of breath. 06/13/23   Bedsole, Amy E, MD  apixaban  (ELIQUIS ) 5 MG TABS tablet Take 2 tablets (10 mg total) by mouth 2 (two) times daily for 6 days, THEN 1 tablet (5 mg total) 2 (two) times daily. 02/28/24 05/04/24  Jillian Buttery, MD  atenolol  (TENORMIN ) 25 MG tablet TAKE 1 TABLET (25 MG TOTAL) BY MOUTH DAILY. 01/19/24   Court Dorn PARAS, MD  cetirizine (ZYRTEC) 10 MG tablet Take 10 mg by mouth  daily.    [provider]  Coenzyme Q10 100 MG TABS Take 100 mg by mouth daily.    [provider]  CRANBERRY PO Take 1,000 mg by mouth in the morning and at bedtime.    [provider]  D-MANNOSE PO Take 4,000 mg by mouth in the morning and at bedtime.    [provider]  DULoxetine  (CYMBALTA ) 60 MG capsule Take 1 capsule (60 mg total) by mouth daily. 07/11/23   Bedsole, Amy E, MD  furosemide  (LASIX ) 40 MG tablet Take 40 mg by mouth daily.    [provider]  guaiFENesin  (MUCINEX ) 600 MG 12 hr tablet Take 1 tablet (600 mg total) by mouth 2 (two) times daily for 7 days. 02/28/24 03/06/24  Jillian Buttery, MD  HYDROcodone -acetaminophen  (NORCO/VICODIN) 5-325 MG tablet Take 1 tablet by mouth in the morning and at bedtime. 02/16/24   [provider]  hydrOXYzine  (ATARAX ) 25 MG tablet Take 25 mg by mouth 2 (two) times daily as needed for anxiety.    [provider]  insulin  lispro (HUMALOG) 100 UNIT/ML injection Inject into the skin 3 (three) times daily before meals.    [provider]  nitrofurantoin , macrocrystal-monohydrate, (MACROBID ) 100 MG capsule Take 100 mg by mouth 2 (two) times daily.    [provider]  pantoprazole  (PROTONIX ) 40 MG tablet TAKE 1 TABLET BY MOUTH EVERY DAY 09/17/19   Wendling, Mabel Mt, DO  polyethylene glycol (MIRALAX  / GLYCOLAX ) 17 g packet Take 17 g by mouth daily as needed for mild constipation. 06/09/23   Hongalgi, Anand D, MD  predniSONE  (DELTASONE ) 10 MG tablet Take 10 mg by mouth daily with breakfast.    [provider]  promethazine  (PHENERGAN ) 12.5 MG tablet Take 12.5 mg by mouth 2 (two) times daily as needed for nausea. 02/16/24   [provider]  traMADol  (ULTRAM ) 50 MG tablet Take 1 tablet (50 mg total) by  mouth every 8 (eight) hours as needed for severe pain (pain score 7-10). 10/21/23   Amin, Ankit C, MD  UNABLE TO FIND Take 1 tablet by mouth in the morning and at  bedtime. Med Name: Korene NOVAK Vitamin    [provider]     Critical care time: 60 minutes

## 2024-03-04 NOTE — Progress Notes (Signed)
 PHARMACY - ANTICOAGULATION CONSULT NOTE  Pharmacy Consult for heparin  Indication: pulmonary embolus and DVT  Allergies  Allergen Reactions   Codeine  Other (See Comments)    Unknown    Doxycycline  Hives   Epinephrine  Hcl Other (See Comments)    Had tremors when it was given.   Erythromycin Ethylsuccinate Other (See Comments)    Abdominal spasms   Eucalyptus Oil Other (See Comments)    Unknown    Iohexol  Hives    Pt needs premedicated-- hives on prev contrast study per md office    Lidocaine  Other (See Comments)    Tremors (lasted months)   Menthol Other (See Comments)    Unknown    Metformin  Diarrhea   Oxycodone  Other (See Comments)    felt like I was dying   Tape Rash    Patient Measurements: Height: 5' 3 (160 cm) Weight: 89.8 kg (197 lb 15.6 oz) IBW/kg (Calculated) : 52.4 HEPARIN  DW (KG): 70.8  Vital Signs: Temp: 97.4 F (36.3 C) (08/14 1121) Temp Source: Oral (08/14 1121) BP: 102/42 (08/14 0700) Pulse Rate: 62 (08/14 0700)  Labs: Recent Labs    03/02/24 1733 03/02/24 1944 03/03/24 0345 03/03/24 0932 03/04/24 0037  HGB 14.4  --  14.0  --  12.4  HCT 44.0  --  42.0  --  38.0  PLT 175  --  142*  --  165  APTT  --   --   --  >200* >200*  HEPARINUNFRC  --   --   --  >1.10*  --   CREATININE 1.31*  --  1.26*  --  1.25*  TROPONINIHS 9 9  --   --   --     Estimated Creatinine Clearance: 39.5 mL/min (A) (by C-G formula based on SCr of 1.25 mg/dL (H)).   Medical History: Past Medical History:  Diagnosis Date   Allergic rhinitis, cause unspecified    Anxiety    Arthritis    hands, feet and back   Asthma    Benign neoplasm of colon    Bursitis    hips   CHF (congestive heart failure) (HCC)    Cirrhosis (HCC)    auto immune   Complication of anesthesia     i WAKE UP WITH TREMORS  after shoulder surgery tremors for 10 months after surgery surgery- 2009- surgical center in [place]   Cyst and pseudocyst of pancreas    Depressive disorder, not  elsewhere classified    Diabetes mellitus without complication (HCC)    Diverticulitis    Diverticulosis of colon (without mention of hemorrhage)    Dyspnea    Dysrhythmia    arrhythmia   Esophageal reflux    Essential and other specified forms of tremor    History of GI bleed    History of kidney stones    Irritable bowel syndrome    Obesity, unspecified    Other specified cardiac dysrhythmias(427.89)    Periapical abscess without sinus    Polyneuropathy in diabetes(357.2)    PONV (postoperative nausea and vomiting)    Pure hypercholesterolemia    Renal cell carcinoma (HCC)    Sleep apnea    CPAP- non compliant   Type II or unspecified type diabetes mellitus with neurological manifestations, not stated as uncontrolled(250.60)    Assessment: [name] [name] presented to ED after fall with recurrent syncopal episodes. CT showed migration of clot into R. Main pulmonary artery; RHS unchanged from last admission.  Pharmacy consulted to switch from heparin  to Eliquis .  CBC stable.   Goal of Therapy:  Heparin  level 0.3-0.7 units/ml aPTT 66-102 seconds Monitor platelets by anticoagulation protocol: Yes   Plan:  Stop heparin  gtt.  Will switch to Eliquis  10 mg BID x 7 days, then 5 mg BID per CCM.  --- copay check performed during past admission. Will be $47/mo.   Vermell Mccallum, PharmD CCM Pharmacy Resident 03/04/2024  11:27 AM

## 2024-03-04 NOTE — Discharge Instructions (Signed)
 Information on my medicine - ELIQUIS  (apixaban )  Why was Eliquis  prescribed for you? Eliquis  was prescribed to treat blood clots that may have been found in the veins of your legs (deep vein thrombosis) or in your lungs (pulmonary embolism) and to reduce the risk of them occurring again.  What do You need to know about Eliquis  ? The starting dose is 10 mg (two 5 mg tablets) taken TWICE daily for the FIRST SEVEN (7) DAYS, then on (enter date)  Thursday, August 21st in the morning (03/11/2024)  the dose is reduced to ONE 5 mg tablet taken TWICE daily.  Eliquis  may be taken with or without food.   Try to take the dose about the same time in the morning and in the evening. If you have difficulty swallowing the tablet whole please discuss with your pharmacist how to take the medication safely.  Take Eliquis  exactly as prescribed and DO NOT stop taking Eliquis  without talking to the doctor who prescribed the medication.  Stopping may increase your risk of developing a new blood clot.  Refill your prescription before you run out.  After discharge, you should have regular check-up appointments with your healthcare provider that is prescribing your Eliquis .    What do you do if you miss a dose? If a dose of ELIQUIS  is not taken at the scheduled time, take it as soon as possible on the same day and twice-daily administration should be resumed. The dose should not be doubled to make up for a missed dose.  Important Safety Information A possible side effect of Eliquis  is bleeding. You should call your healthcare provider right away if you experience any of the following: Bleeding from an injury or your nose that does not stop. Unusual colored urine (red or dark brown) or unusual colored stools (red or black). Unusual bruising for unknown reasons. A serious fall or if you hit your head (even if there is no bleeding).  Some medicines may interact with Eliquis  and might increase your risk of  bleeding or clotting while on Eliquis . To help avoid this, consult your healthcare provider or pharmacist prior to using any new prescription or non-prescription medications, including herbals, vitamins, non-steroidal anti-inflammatory drugs (NSAIDs) and supplements.  This website has more information on Eliquis  (apixaban ): [email or web address]

## 2024-03-04 NOTE — Progress Notes (Signed)
 PHARMACY - ANTICOAGULATION CONSULT NOTE  Pharmacy Consult for heparin  Indication: pulmonary embolus and DVT  Allergies  Allergen Reactions   Codeine  Other (See Comments)    Unknown    Doxycycline  Hives   Epinephrine  Hcl Other (See Comments)    Had tremors when it was given.   Erythromycin Ethylsuccinate Other (See Comments)    Abdominal spasms   Eucalyptus Oil Other (See Comments)    Unknown    Iohexol  Hives    Pt needs premedicated-- hives on prev contrast study per md office    Lidocaine  Other (See Comments)    Tremors (lasted months)   Menthol Other (See Comments)    Unknown    Metformin  Diarrhea   Oxycodone  Other (See Comments)    felt like I was dying   Tape Rash    Patient Measurements: Height: 5' 3 (160 cm) Weight: 83.4 kg (183 lb 13.8 oz) IBW/kg (Calculated) : 52.4 HEPARIN  DW (KG): 70.8  Vital Signs: Temp: 96.9 F (36.1 C) (08/14 0000) Temp Source: Axillary (08/14 0000) BP: 105/58 (08/14 0000) Pulse Rate: 72 (08/14 0000)  Labs: Recent Labs    03/02/24 1733 03/02/24 1944 03/03/24 0345 03/03/24 0932 03/04/24 0037  HGB 14.4  --  14.0  --  12.4  HCT 44.0  --  42.0  --  38.0  PLT 175  --  142*  --  165  APTT  --   --   --  >200* >200*  HEPARINUNFRC  --   --   --  >1.10*  --   CREATININE 1.31*  --  1.26*  --  1.25*  TROPONINIHS 9 9  --   --   --     Estimated Creatinine Clearance: 37.9 mL/min (A) (by C-G formula based on SCr of 1.25 mg/dL (H)).   Medical History: Past Medical History:  Diagnosis Date   Allergic rhinitis, cause unspecified    Anxiety    Arthritis    hands, feet and back   Asthma    Benign neoplasm of colon    Bursitis    hips   CHF (congestive heart failure) (HCC)    Cirrhosis (HCC)    auto immune   Complication of anesthesia     i WAKE UP WITH TREMORS  after shoulder surgery tremors for 10 months after surgery surgery- 2009- surgical center in [place]   Cyst and pseudocyst of pancreas    Depressive disorder, not  elsewhere classified    Diabetes mellitus without complication (HCC)    Diverticulitis    Diverticulosis of colon (without mention of hemorrhage)    Dyspnea    Dysrhythmia    arrhythmia   Esophageal reflux    Essential and other specified forms of tremor    History of GI bleed    History of kidney stones    Irritable bowel syndrome    Obesity, unspecified    Other specified cardiac dysrhythmias(427.89)    Periapical abscess without sinus    Polyneuropathy in diabetes(357.2)    PONV (postoperative nausea and vomiting)    Pure hypercholesterolemia    Renal cell carcinoma (HCC)    Sleep apnea    CPAP- non compliant   Type II or unspecified type diabetes mellitus with neurological manifestations, not stated as uncontrolled(250.60)     Medications:  -Eliquis  10mg  PO BID for large PE (LD 8/12 11AM)  Assessment: 30 yoF presented to ED after fall with recurrent syncopal episodes. CT showed migration of clot into R. Main pulmonary artery;  RHS unchanged from last admission. She will undergo thrombectomy today @1300 . Pharmacy consulted to dose heparin  for PE and bilateral LE DVT.  CBC stable. Aptt supra therapeutic at >200. Per nurse, level was draw via lap stick and patient does not have a line. No concerns of bleeding at this time.   aPTT > 200 sec above goal.  Drawn from L arm, heparin  running through R PIV.  Some oozing from peripheral IV, resolved after dressing change.   Goal of Therapy:  Heparin  level 0.3-0.7 units/ml aPTT 66-102 seconds Monitor platelets by anticoagulation protocol: Yes   Plan:  Hold heparin  for 1hr Restart heparin  infusion at reduced dose at 850 units/hr. 8h aPTT  Maurilio Fila, PharmD Clinical Pharmacist 03/04/2024  1:45 AM

## 2024-03-04 NOTE — Progress Notes (Signed)
*  PRELIMINARY RESULTS* Echocardiogram 2D Echocardiogram has been performed.  [name] [name] Stallion 03/04/2024, 12:13 PM

## 2024-03-05 ENCOUNTER — Inpatient Hospital Stay (HOSPITAL_COMMUNITY)

## 2024-03-05 DIAGNOSIS — I2602 Saddle embolus of pulmonary artery with acute cor pulmonale: Secondary | ICD-10-CM | POA: Diagnosis not present

## 2024-03-05 LAB — RENAL FUNCTION PANEL
Albumin: 2.5 g/dL — ABNORMAL LOW (ref 3.5–5.0)
Anion gap: 9 (ref 5–15)
BUN: 25 mg/dL — ABNORMAL HIGH (ref 8–23)
CO2: 24 mmol/L (ref 22–32)
Calcium: 9 mg/dL (ref 8.9–10.3)
Chloride: 103 mmol/L (ref 98–111)
Creatinine, Ser: 1.33 mg/dL — ABNORMAL HIGH (ref 0.44–1.00)
GFR, Estimated: 41 mL/min — ABNORMAL LOW (ref >60)
Glucose, Bld: 308 mg/dL — ABNORMAL HIGH (ref 70–99)
Phosphorus: 3 mg/dL (ref 2.5–4.6)
Potassium: 4.2 mmol/L (ref 3.5–5.1)
Sodium: 136 mmol/L (ref 135–145)

## 2024-03-05 LAB — CBC
HCT: 33.3 % — ABNORMAL LOW (ref 36.0–46.0)
Hemoglobin: 10.8 g/dL — ABNORMAL LOW (ref 12.0–15.0)
MCH: 30.4 pg (ref 26.0–34.0)
MCHC: 32.4 g/dL (ref 30.0–36.0)
MCV: 93.8 fL (ref 80.0–100.0)
Platelets: 107 K/uL — ABNORMAL LOW (ref 150–400)
RBC: 3.55 MIL/uL — ABNORMAL LOW (ref 3.87–5.11)
RDW: 13.6 % (ref 11.5–15.5)
WBC: 5.7 K/uL (ref 4.0–10.5)
nRBC: 0 % (ref 0.0–0.2)

## 2024-03-05 LAB — GLUCOSE, CAPILLARY
Glucose-Capillary: 282 mg/dL — ABNORMAL HIGH (ref 70–99)
Glucose-Capillary: 232 mg/dL — ABNORMAL HIGH (ref 70–99)
Glucose-Capillary: 283 mg/dL — ABNORMAL HIGH (ref 70–99)

## 2024-03-05 MED ORDER — ALPRAZOLAM 0.25 MG PO TABS
0.2500 mg | ORAL_TABLET | Freq: Once | ORAL | Status: DC
  Filled 2024-03-05: qty 1

## 2024-03-05 NOTE — Progress Notes (Signed)
  Progress Note   PatientAriadna [name] FMW:[id number] DOB: March 26, 1946 DOA: 03/02/2024     2 DOS: the patient was seen and examined on 03/05/2024   Brief hospital course: 78 y/o F with a PMH signficant for renal cell carcinoma s/p nephrectomy and a recent hx of PE on anticoagulation who presents for syncopal episode with head trauma found to have submassive PE requiring ICU level care for hemodynamic monitoring.   Assessment and Plan: Acute saddle PE  - Eliquis  10 mg PO bid then 5 mg PO bid  - Norco PRN  - IV dilaudid  1 mg q4 hr PRN   Orthostatic hypotension w/ falls  - PT/OT ordered 03/05/2024  CKD3a  - Stable;monitor   DM2  - Novolog  SS tid   GERD - Protonix  40 mg PO daily   Anxiety  - Cymbalta  60 mg PO daily   Chronic debility  - PT/OT ordered 03/05/2024  Subjective: Pt seen and examined at the bedside. She reports some intermittent dizziness today. PT/OT has been ordered today. She is now on eliquis . Awaiting formal PT eval (prior to discharge) as pt is a high fall risk and is on eliquis .   Physical Exam: Vitals:   03/04/24 2011 03/05/24 0035 03/05/24 0454 03/05/24 0747  BP: (!) 95/54 95/70 121/67 (!) 160/96  Pulse: 87 68 99 94  Resp: 18 15 16 16   Temp: 97.7 F (36.5 C) 97.8 F (36.6 C) 98 F (36.7 C) 97.8 F (36.6 C)  TempSrc: Oral  Oral   SpO2: 99% (!) 86% (!) 89% 99%  Weight:      Height:       Physical Exam HENT:     Head: Normocephalic.     Mouth/Throat:     Mouth: Mucous membranes are moist.  Cardiovascular:     Rate and Rhythm: Normal rate.  Pulmonary:     Effort: Pulmonary effort is normal.  Abdominal:     Palpations: Abdomen is soft.  Musculoskeletal:        General: Normal range of motion.  Skin:    General: Skin is warm.  Neurological:     Mental Status: She is alert. Mental status is at baseline.  Psychiatric:        Mood and Affect: Mood normal.      Disposition: Status is: Inpatient Remains inpatient appropriate because: Formal PT  eval  Planned Discharge Destination: Barriers to discharge: As above    Time spent: 35 minutes  Author: Jeramy Dimmick , MD 03/05/2024 2:17 PM  For on call review [email or web address].

## 2024-03-05 NOTE — Progress Notes (Addendum)
 Nurse attempted to collect vital signs from patient.  Blood pressure cuff was applied to her left upper arm and once the cuff began to inflate the patient stated This is too tight, take if off right now, I mean it. Nobody takes my blood pressure there, they've been using the lower arm all this time.  Nurse apologized as she had not been informed of this, and attempted to take the blood pressure again, but patient refused stating Don't touch me.  Nurse attempted to check patient's temperature, but patient refused stating she would not let anyone provide any care for her.  Nurse educated patient on the importance of obtaining help from staff and to avoid getting out of bed without assistance to reduce falls, but patient stated she didn't care and her back hurt from being in the bed so she was going to get out if she wanted to.  Patient stated Stop it now, you were being nice at first and now you are just being mean  Fall mat placed beside bed for safety and bed alarm was turned on.  Nurse turned off the light and TV per patient request and call light was placed in reach by her left arm.

## 2024-03-05 NOTE — Progress Notes (Signed)
 Patient extremely agitated with staff and family members.  Patient was scheduled for an MRI, but refused once she was taken to radiology, stating that she became anxious in closed spaces.  Patient was brought back to room where daughter and son-in-law were waiting.  Patient became extremely agitated with daughter and using profanity.  Daughter and son-in-law asked if patient had received any medication when she went down for her MRI because she was not acting like herself, but no medications were given since she refused the MRI.  Daughter and son-in-law left to avoid further agitating patient.

## 2024-03-05 NOTE — Progress Notes (Addendum)
 Nurse called into room due to patient pulling IV out.  Since patient is on anticoagulants, patient did have some bleeding to the site where the IV  originally was.  Catheter intact, pressure applied to site and reinforced with gauze and tape.  Patient was advised that she would need to wear mittens in order to keep second IV from being pulled.  Patient refused stating she would not do anything until she saw her daughter.  Nurse attempted to help patient call daughter on the phone but patient stated If she's not here, then don't call her.  Patient's gown was changed and patient was educated on the importance of keeping IVs in place.  Patient stated she had already had her IV for days and continued to be agitated due to being taken down to radiology for an MRI, even though she had been informed of this before being taken downstairs.  Patient stated that she should not have been taken, and that she would not allow anyone to place any type of medication into her IV to put her to sleep to take her back for an MRI.  Patient was informed that the MRI would not be taking place this evening, but patient did not believe nurse and was agitated that her daughter had created this whole mess by bringing her to the hospital.  Patient was educated on her PE and why she was hospitalized.  Patient stated that she had already had a filter placed and did not need anything else done.  Patient asked nursing staff to leave her alone.  Patient's bed was lowered and patient was educated on call light use and she was given the call light.  Patient refused to take the call light, stating she didn't want anything from nursing staff and to leave her alone.  Nursing staff left call light next to her left arm so she could reach it if needed.

## 2024-03-05 NOTE — Plan of Care (Signed)
   Problem: Education: Goal: Knowledge of General Education information will improve Description: Including pain rating scale, medication(s)/side effects and non-pharmacologic comfort measures Outcome: Progressing   Problem: Activity: Goal: Risk for activity intolerance will decrease Outcome: Progressing   Problem: Nutrition: Goal: Adequate nutrition will be maintained Outcome: Progressing   Problem: Coping: Goal: Level of anxiety will decrease Outcome: Progressing

## 2024-03-05 NOTE — Progress Notes (Signed)
 Nurse technician attempted to collect vital signs from patient but patient continued being agitated and refused.  Nurse technician noticed blood all over the bed sheets and upon further inspection noted that the patient had pulled her second IV out, catheter was intact.  Nurse technician asked patient if she would allow her to change the bed sheets, but patient refused and would not allow anyone to touch her or further assess the her right hand where IV had been located.  Nurse technician informed nurse, who went to assess patient.  At this time, patient was no longer bleeding from her right hand.  Nurse informed patient that her bed sheets would be changed since they were soiled with blood.  Patient refused, stating she would not let anyone touch her.  Nurse also attempted to give patient her HS medication, patient refused, stating she would not take anything.  MD notified of patient agitation and refusal of care and medication administration.

## 2024-03-05 NOTE — Evaluation (Signed)
 Physical Therapy Evaluation Patient Details Name: [name] [name] MRN: [id number] DOB: 04/21/1946 Today's Date: 03/05/2024  History of Present Illness  78 yo female [facility] to [facility] ED on 03/02/24 for a fall. CT showed PE migrated distally to R pulmonary artery. S/p PE thromectomy w/ placement of IVC filter 8/13. CT Head negative for acute findings. PMH: renal cell carinoma s/p nephrectomy, T2DM, CHF, autoimune hepatitis/cirrhosis, anxiety, GERD  Clinical Impression  Pt presents with decreased balance, functional mobility, and dizziness. Pt presents with vestibular symptoms, found the following: Tested for peripheral source of vertigo, BPPV. Performed R Dix hallpike test, negative for nystagmus or symptoms. Followed with L Dixhallpike + for upbeating rotary nystagmus towards L. Began treating L BPPV with epley's, When turning her head to the R, got torsional, upbeat nystagmus to towards R. RN and Dr notified. Pt to benefit from acute PT to address deficits. Pt ambulated room distance, declined further gait due to pain and to not exacerbate dizziness. Educated pt on vestibular system and how to reduce dizziness when returning back to bed. PT to progress mobility as tolerated, and will continue to follow acutely.       If plan is discharge home, recommend the following: Assist for transportation;Help with stairs or ramp for entrance;Assistance with cooking/housework   Can travel by private vehicle        Equipment Recommendations None recommended by PT  Recommendations for Other Services       Functional Status Assessment Patient has had a recent decline in their functional status and demonstrates the ability to make significant improvements in function in a reasonable and predictable amount of time.     Precautions / Restrictions Precautions Precautions: Fall Restrictions Weight Bearing Restrictions Per Provider Order: No      Mobility  Bed Mobility Overal bed mobility: Needs  Assistance Bed Mobility: Supine to Sit     Supine to sit: Min assist     General bed mobility comments: Trunk management    Transfers Overall transfer level: Needs assistance Equipment used: Rolling walker (2 wheels) Transfers: Sit to/from Stand Sit to Stand: Contact guard assist           General transfer comment: For safety    Ambulation/Gait Ambulation/Gait assistance: Supervision Gait Distance (Feet): 10 Feet Assistive device: Rolling walker (2 wheels) Gait Pattern/deviations: Step-through pattern, Decreased step length - right, Decreased step length - left, Shuffle, Trunk flexed, Antalgic       General Gait Details: No overt LOB, cues to keep head steady for vestibular problems.  Stairs            Wheelchair Mobility     Tilt Bed    Modified Rankin (Stroke Patients Only)       Balance Overall balance assessment: Needs assistance Sitting-balance support: Feet supported, Single extremity supported Sitting balance-Leahy Scale: Fair     Standing balance support: During functional activity, Reliant on assistive device for balance, Bilateral upper extremity supported Standing balance-Leahy Scale: Fair                               Pertinent Vitals/Pain Pain Assessment Pain Assessment: Faces Faces Pain Scale: Hurts little more Pain Location: Back and ribs Pain Descriptors / Indicators: Aching, Grimacing Pain Intervention(s): Monitored during session, Limited activity within patient's tolerance    Home Living Family/patient expects to be discharged to:: Private residence Living Arrangements: Alone Available Help at Discharge: Available PRN/intermittently;Family Type of Home: House Home  Access: Ramped entrance       Home Layout: One level Home Equipment: Grab bars - tub/shower;Grab bars - toilet;Rollator (4 wheels);Wheelchair - manual;Cane - single point (Trying to get grab bars installed) Additional Comments: PCA 1x/week for  IADLs and bathing    Prior Function Prior Level of Function : Needs assist;History of Falls (last six months)             Mobility Comments: independent w/ rollator ADLs Comments: patient reported living at home alone with independence in basic ADLs     Extremity/Trunk Assessment   Upper Extremity Assessment Upper Extremity Assessment: Generalized weakness    Lower Extremity Assessment Lower Extremity Assessment: Generalized weakness    Cervical / Trunk Assessment Cervical / Trunk Assessment: Normal  Communication   Communication Communication: No apparent difficulties    Cognition Arousal: Alert Behavior During Therapy: WFL for tasks assessed/performed   PT - Cognitive impairments: No apparent impairments, Memory                       PT - Cognition Comments: Unable to recall full details of certain events Following commands: Intact       Cueing Cueing Techniques: Verbal cues, Tactile cues, Gestural cues     General Comments General comments (skin integrity, edema, etc.): Tested for peripheral source of vertigo, BPPV. Performed R Dix hallpike test, negative for nystagmus or symptoms. Followed with L Dixhallpike + for upbeating rotary nystagmus towards L. Began treating L BPPV with epley's, When turning her head to the R, got torsional, upbeat nystagmus to towards R. RN and Dr notified.    Exercises     Assessment/Plan    PT Assessment Patient needs continued PT services  PT Problem List Decreased strength;Decreased activity tolerance;Decreased balance;Decreased mobility       PT Treatment Interventions DME instruction;Gait training;Functional mobility training;Therapeutic activities;Therapeutic exercise;Patient/family education;Balance training;Canalith reposition    PT Goals (Current goals can be found in the Care Plan section)  Acute Rehab PT Goals Patient Stated Goal: Regain independence PT Goal Formulation: With patient Time For Goal  Achievement: 03/19/24 Potential to Achieve Goals: Good Additional Goals Additional Goal #1: Reduce dizziness with position changs    Frequency Min 3X/week     Co-evaluation               AM-PAC PT 6 Clicks Mobility  Outcome Measure Help needed turning from your back to your side while in a flat bed without using bedrails?: A Little Help needed moving from lying on your back to sitting on the side of a flat bed without using bedrails?: A Little Help needed moving to and from a bed to a chair (including a wheelchair)?: A Little Help needed standing up from a chair using your arms (e.g., wheelchair or bedside chair)?: A Little Help needed to walk in hospital room?: A Little Help needed climbing 3-5 steps with a railing? : A Little 6 Click Score: 18    End of Session Equipment Utilized During Treatment: Gait belt Activity Tolerance: Patient tolerated treatment well Patient left: in chair;with call bell/phone within reach;with chair alarm set Nurse Communication: Mobility status PT Visit Diagnosis: Difficulty in walking, not elsewhere classified (R26.2);Muscle weakness (generalized) (M62.81);History of falling (Z91.81)    Time: 1534-1610 PT Time Calculation (min) (ACUTE ONLY): 36 min   Charges:   PT Evaluation $PT Eval Moderate Complexity: 1 Mod PT Treatments $Therapeutic Activity: 8-22 mins PT General Charges $$ ACUTE PT VISIT: 1 Visit  Quintin Campi, SPT  Acute Rehab  [phone]   Quintin Campi 03/05/2024, 4:55 PM

## 2024-03-05 NOTE — Inpatient Diabetes Management (Signed)
 Inpatient Diabetes Program Recommendations  AACE/ADA: New Consensus Statement on Inpatient Glycemic Control (2015)  Target Ranges:  Prepandial:   less than 140 mg/dL      Peak postprandial:   less than 180 mg/dL (1-2 hours)      Critically ill patients:  140 - 180 mg/dL   Lab Results  Component Value Date   GLUCAP 282 (H) 03/05/2024   HGBA1C 8.1 (H) 03/03/2024    Review of Glycemic Control  Latest Reference Range & Units 03/04/24 07:29 03/04/24 11:22 03/04/24 17:52 03/04/24 20:51 03/05/24 06:19  Glucose-Capillary 70 - 99 mg/dL 89 784 (H) 642 (H) 732 (H) 282 (H)   Diabetes history: DM2 Outpatient Diabetes medications: Insulin  pump-Medtr[employer] Current orders for Inpatient glycemic control: Novolog  0-15 units tid  Inpatient Diabetes Program Recommendations:    -   Start Semglee  15 units -   Add Novolog  hs scale  Thanks,  Clotilda Bull RN, MSN, BC-ADM Inpatient Diabetes Coordinator Team Pager [phone] (8a-5p)

## 2024-03-06 ENCOUNTER — Inpatient Hospital Stay (HOSPITAL_COMMUNITY): Admitting: Certified Registered"

## 2024-03-06 ENCOUNTER — Encounter (HOSPITAL_COMMUNITY): Payer: Self-pay

## 2024-03-06 ENCOUNTER — Encounter (HOSPITAL_COMMUNITY)
Admission: EM | Disposition: A | Discharge status: Home Health | Payer: Self-pay | Source: Home / Self Care | Attending: Internal Medicine

## 2024-03-06 ENCOUNTER — Inpatient Hospital Stay (HOSPITAL_COMMUNITY)

## 2024-03-06 DIAGNOSIS — R4182 Altered mental status, unspecified: Secondary | ICD-10-CM | POA: Diagnosis not present

## 2024-03-06 DIAGNOSIS — I130 Hypertensive heart and chronic kidney disease with heart failure and stage 1 through stage 4 chronic kidney disease, or unspecified chronic kidney disease: Secondary | ICD-10-CM

## 2024-03-06 DIAGNOSIS — I509 Heart failure, unspecified: Secondary | ICD-10-CM | POA: Diagnosis not present

## 2024-03-06 DIAGNOSIS — N1831 Chronic kidney disease, stage 3a: Secondary | ICD-10-CM

## 2024-03-06 DIAGNOSIS — I2602 Saddle embolus of pulmonary artery with acute cor pulmonale: Secondary | ICD-10-CM | POA: Diagnosis not present

## 2024-03-06 HISTORY — PX: RADIOLOGY WITH ANESTHESIA: SHX6223

## 2024-03-06 LAB — URINALYSIS, W/ REFLEX TO CULTURE (INFECTION SUSPECTED)
Bilirubin Urine: NEGATIVE
Glucose, UA: NEGATIVE mg/dL
Ketones, ur: 5 mg/dL — AB
Nitrite: NEGATIVE
Protein, ur: 30 mg/dL — AB
Specific Gravity, Urine: 1.023 (ref 1.005–1.030)
WBC, UA: >50 WBC/hpf (ref 0–5)
pH: 6 (ref 5.0–8.0)

## 2024-03-06 LAB — MAGNESIUM: Magnesium: 1.8 mg/dL (ref 1.7–2.4)

## 2024-03-06 LAB — APTT: aPTT: 35 s (ref 24–36)

## 2024-03-06 LAB — CBC
HCT: 35.4 % — ABNORMAL LOW (ref 36.0–46.0)
HCT: 34.9 % — ABNORMAL LOW (ref 36.0–46.0)
Hemoglobin: 11.6 g/dL — ABNORMAL LOW (ref 12.0–15.0)
Hemoglobin: 11.5 g/dL — ABNORMAL LOW (ref 12.0–15.0)
MCH: 30.3 pg (ref 26.0–34.0)
MCH: 30.3 pg (ref 26.0–34.0)
MCHC: 32.8 g/dL (ref 30.0–36.0)
MCHC: 33 g/dL (ref 30.0–36.0)
MCV: 92.4 fL (ref 80.0–100.0)
MCV: 92.1 fL (ref 80.0–100.0)
Platelets: 117 K/uL — ABNORMAL LOW (ref 150–400)
Platelets: 112 K/uL — ABNORMAL LOW (ref 150–400)
RBC: 3.83 MIL/uL — ABNORMAL LOW (ref 3.87–5.11)
RBC: 3.79 MIL/uL — ABNORMAL LOW (ref 3.87–5.11)
RDW: 13.6 % (ref 11.5–15.5)
RDW: 13.6 % (ref 11.5–15.5)
WBC: 7.4 K/uL (ref 4.0–10.5)
WBC: 7.4 K/uL (ref 4.0–10.5)
nRBC: 0 % (ref 0.0–0.2)
nRBC: 0 % (ref 0.0–0.2)

## 2024-03-06 LAB — PROTIME-INR
INR: 1.9 — ABNORMAL HIGH (ref 0.8–1.2)
Prothrombin Time: 22.5 s — ABNORMAL HIGH (ref 11.4–15.2)

## 2024-03-06 LAB — RENAL FUNCTION PANEL
Albumin: 2.8 g/dL — ABNORMAL LOW (ref 3.5–5.0)
Anion gap: 8 (ref 5–15)
BUN: 18 mg/dL (ref 8–23)
CO2: 24 mmol/L (ref 22–32)
Calcium: 9 mg/dL (ref 8.9–10.3)
Chloride: 104 mmol/L (ref 98–111)
Creatinine, Ser: 1.11 mg/dL — ABNORMAL HIGH (ref 0.44–1.00)
GFR, Estimated: 51 mL/min — ABNORMAL LOW (ref >60)
Glucose, Bld: 190 mg/dL — ABNORMAL HIGH (ref 70–99)
Phosphorus: 2.4 mg/dL — ABNORMAL LOW (ref 2.5–4.6)
Potassium: 4 mmol/L (ref 3.5–5.1)
Sodium: 136 mmol/L (ref 135–145)

## 2024-03-06 LAB — AMMONIA: Ammonia: 27 umol/L (ref 9–35)

## 2024-03-06 LAB — TSH: TSH: 2.955 u[IU]/mL (ref 0.350–4.500)

## 2024-03-06 LAB — GLUCOSE, CAPILLARY
Glucose-Capillary: 244 mg/dL — ABNORMAL HIGH (ref 70–99)
Glucose-Capillary: 197 mg/dL — ABNORMAL HIGH (ref 70–99)

## 2024-03-06 LAB — C-REACTIVE PROTEIN: CRP: 4 mg/dL — ABNORMAL HIGH (ref <1.0)

## 2024-03-06 LAB — PHOSPHORUS: Phosphorus: 2.4 mg/dL — ABNORMAL LOW (ref 2.5–4.6)

## 2024-03-06 SURGERY — MRI WITH ANESTHESIA
Anesthesia: General

## 2024-03-06 MED ORDER — POTASSIUM PHOSPHATES 15 MMOLE/5ML IV SOLN
15.0000 mmol | Freq: Once | INTRAVENOUS | Status: AC
  Administered 2024-03-06: 15 mmol via INTRAVENOUS
  Filled 2024-03-06: qty 5

## 2024-03-06 MED ORDER — LIDOCAINE 2% (20 MG/ML) 5 ML SYRINGE
INTRAMUSCULAR | Status: DC | PRN
  Administered 2024-03-06: 40 mg via INTRAVENOUS

## 2024-03-06 MED ORDER — SUCCINYLCHOLINE CHLORIDE 200 MG/10ML IV SOSY
PREFILLED_SYRINGE | INTRAVENOUS | Status: DC | PRN
  Administered 2024-03-06: 100 mg via INTRAVENOUS

## 2024-03-06 MED ORDER — PHENYLEPHRINE 80 MCG/ML (10ML) SYRINGE FOR IV PUSH (FOR BLOOD PRESSURE SUPPORT)
PREFILLED_SYRINGE | INTRAVENOUS | Status: DC | PRN
  Administered 2024-03-06: 80 ug via INTRAVENOUS
  Administered 2024-03-06: 160 ug via INTRAVENOUS
  Administered 2024-03-06: 80 ug via INTRAVENOUS

## 2024-03-06 MED ORDER — DEXMEDETOMIDINE HCL IN NACL 200 MCG/50ML IV SOLN
INTRAVENOUS | Status: DC | PRN
  Administered 2024-03-06: 12 ug via INTRAVENOUS

## 2024-03-06 MED ORDER — LACTATED RINGERS IV SOLN: INTRAVENOUS | Status: DC | PRN

## 2024-03-06 MED ORDER — MAGNESIUM SULFATE 2 GM/50ML IV SOLN
2.0000 g | Freq: Once | INTRAVENOUS | Status: AC
  Administered 2024-03-06: 2 g via INTRAVENOUS
  Filled 2024-03-06: qty 50

## 2024-03-06 MED ORDER — ONDANSETRON HCL 4 MG/2ML IJ SOLN
INTRAMUSCULAR | Status: DC | PRN
  Administered 2024-03-06: 4 mg via INTRAVENOUS

## 2024-03-06 MED ORDER — PROPOFOL 10 MG/ML IV BOLUS
INTRAVENOUS | Status: DC | PRN
  Administered 2024-03-06: 150 mg via INTRAVENOUS

## 2024-03-06 MED ORDER — SODIUM CHLORIDE 0.9 % IV SOLN
2.0000 g | INTRAVENOUS | Status: DC
  Administered 2024-03-06 – 2024-03-07 (×2): 2 g via INTRAVENOUS
  Filled 2024-03-06 (×2): qty 20

## 2024-03-06 MED ORDER — DEXAMETHASONE SODIUM PHOSPHATE 10 MG/ML IJ SOLN
INTRAMUSCULAR | Status: DC | PRN
  Administered 2024-03-06: 10 mg via INTRAVENOUS

## 2024-03-06 MED ORDER — HALOPERIDOL LACTATE 5 MG/ML IJ SOLN
2.0000 mg | Freq: Once | INTRAMUSCULAR | Status: AC
  Administered 2024-03-06: 2 mg via INTRAMUSCULAR
  Filled 2024-03-06: qty 1

## 2024-03-06 NOTE — Progress Notes (Signed)
   03/06/24 1515  Spiritual Encounters  Type of Visit Initial  Care provided to: Family;Pt not available  OnCall Visit Yes  Interventions  Spiritual Care Interventions Made Established relationship of care and support;Compassionate presence;Prayer;Reflective listening  Intervention Outcomes  Outcomes Awareness of support  Spiritual Care Plan  Spiritual Care Issues Still Outstanding No further spiritual care needs at this time (see row info)   Chaplain prayed with son, [name] [name] as requested.   Chaplain spiritual support services remain available as the need arises.

## 2024-03-06 NOTE — Progress Notes (Signed)
 PT Cancellation Note  Patient Details Name: [name] [name] MRN: [id number] DOB: May 02, 1946   Cancelled Treatment:    Reason Eval/Treat Not Completed: Medical issues which prohibited therapy; note off the floor for MRI with sedation.  Will follow up another day.   Montie Portal 03/06/2024, 1:30 PM Micheline Portal, PT Acute Rehabilitation Services [phone] 03/06/2024

## 2024-03-06 NOTE — Progress Notes (Signed)
 IV team consulted to place second IV while patient is under anesthesia in case patient needs CTA, other vascular access needs. Confirmed that current IV is functioning properly. Discussed Vein Preservation policy and that IV team will initiate new access if it is needed. Consult completed.

## 2024-03-06 NOTE — Anesthesia Procedure Notes (Signed)
 Procedure Name: Intubation Date/Time: 03/06/2024 12:45 PM  Performed by: Tressie Gilmore RAMAN, CRNAPre-anesthesia Checklist: Patient identified, Emergency Drugs available, Suction available and Patient being monitored Patient Re-evaluated:Patient Re-evaluated prior to induction Oxygen Delivery Method: Circle system utilized Preoxygenation: Pre-oxygenation with 100% oxygen Induction Type: IV induction Ventilation: Mask ventilation without difficulty Laryngoscope Size: Glidescope and 3 Grade View: Grade II Tube type: Oral Tube size: 7.0 mm Number of attempts: 1 Airway Equipment and Method: Stylet and Oral airway Placement Confirmation: ETT inserted through vocal cords under direct vision, positive ETCO2 and breath sounds checked- equal and bilateral Secured at: 20 cm Tube secured with: Tape Dental Injury: Teeth and Oropharynx as per pre-operative assessment  Comments: No trauma or injury to dental or soft tissue

## 2024-03-06 NOTE — Plan of Care (Signed)

## 2024-03-06 NOTE — Progress Notes (Addendum)
 Chaplain prayed with son, Oneil.   Chaplain spiritual support services remain available as the need arises.

## 2024-03-06 NOTE — Progress Notes (Signed)
 Pt. continues being agitated and refusing care.  On call provider notified of patient's refusal to be changed, her pulling her IVs out and refusing to let staff place another one,  and her non-compliance to medications, no new orders given at this time to place IV or give medications ;.

## 2024-03-06 NOTE — Anesthesia Preprocedure Evaluation (Signed)
 Anesthesia Evaluation  Patient identified by MRN, date of birth, ID band Patient confused    Reviewed: Allergy & Precautions, H&P , NPO status , Patient's Chart, lab work & pertinent test results  History of Anesthesia Complications (+) PONV and history of anesthetic complications  Airway Mallampati: II   Neck ROM: full    Dental   Pulmonary asthma , sleep apnea , COPD   breath sounds clear to auscultation       Cardiovascular hypertension, +CHF  + dysrhythmias  Rhythm:regular Rate:Normal     Neuro/Psych  PSYCHIATRIC DISORDERS Anxiety Depression       GI/Hepatic ,GERD  ,,(+) Hepatitis -  Endo/Other  diabetes, Type 2    Renal/GU      Musculoskeletal  (+) Arthritis ,    Abdominal   Peds  Hematology   Anesthesia Other Findings   Reproductive/Obstetrics                              Anesthesia Physical Anesthesia Plan  ASA: 4  Anesthesia Plan: General   Post-op Pain Management:    Induction: Intravenous  PONV Risk Score and Plan: 4 or greater and Ondansetron , Dexamethasone  and Treatment may vary due to age or medical condition  Airway Management Planned: Oral ETT  Additional Equipment:   Intra-op Plan:   Post-operative Plan: Extubation in OR  Informed Consent: I have reviewed the patients History and Physical, chart, labs and discussed the procedure including the risks, benefits and alternatives for the proposed anesthesia with the patient or authorized representative who has indicated his/her understanding and acceptance.     Dental advisory given and Consent reviewed with POA  Plan Discussed with: CRNA, Anesthesiologist and Surgeon  Anesthesia Plan Comments:         Anesthesia Quick Evaluation

## 2024-03-06 NOTE — Progress Notes (Signed)
   03/06/24 1415  Assess: MEWS Score  BP (!) 101/44  MAP (mmHg) (!) 58  Pulse Rate 86  ECG Heart Rate 86  Resp (!) 22  SpO2 98 %  O2 Device Room Air  Assess: MEWS Score  MEWS Temp 0  MEWS Systolic 0  MEWS Pulse 0  MEWS RR 1  MEWS LOC 1  MEWS Score 2  MEWS Score Color Yellow  Assess: if the MEWS score is Yellow or Red  Were vital signs accurate and taken at a resting state? Yes  Assess: SIRS CRITERIA  SIRS Temperature  0  SIRS Respirations  1  SIRS Pulse 0  SIRS WBC 0  SIRS Score Sum  1    Patient not on unit - in PACU

## 2024-03-06 NOTE — Progress Notes (Signed)
 OT Cancellation Note  Patient Details Name: [name] [name] MRN: [id number] DOB: [birth year]   Cancelled Treatment:    Reason Eval/Treat Not Completed: Patient at procedure or test/ unavailable (Pt currently off unit in PACU. OT to reattempt to see pt at a later time as appropriate/available.)  [name], OTR/L, MA Acute Rehab [phone]   [name] Horns 03/06/2024, 2:35 PM

## 2024-03-06 NOTE — Progress Notes (Signed)
 Disregard flowsheet note taken at 1515.   Wrong patient noted for EOL.

## 2024-03-06 NOTE — Plan of Care (Signed)
  Problem: Clinical Measurements: Goal: Cardiovascular complication will be avoided Outcome: Progressing   Problem: Activity: Goal: Risk for activity intolerance will decrease Outcome: Progressing   Problem: Nutrition: Goal: Adequate nutrition will be maintained Outcome: Progressing   Problem: Safety: Goal: Ability to remain free from injury will improve Outcome: Progressing

## 2024-03-06 NOTE — Transfer of Care (Signed)
 Immediate Anesthesia Transfer of Care Note  Patient: [name] [name]  Procedure(s) Performed: MRI WITH ANESTHESIA  Patient Location: PACU  Anesthesia Type:MAC  Level of Consciousness: awake, alert , and patient cooperative  Airway & Oxygen Therapy: Patient Spontanous Breathing  Post-op Assessment: Report given to RN and Post -op Vital signs reviewed and stable  Post vital signs: Reviewed and stable  Last Vitals:  Vitals Value Taken Time  BP 133/63 03/06/24 13:50  Temp 98.5   Pulse 85 03/06/24 13:53  Resp 19 03/06/24 13:54  SpO2 97 % 03/06/24 13:53  Vitals shown include unfiled device data.  Last Pain:  Vitals:   03/06/24 0700  PainSc: 0-No pain      Patients Stated Pain Goal: 0 (03/03/24 0400)  Complications: There were no known notable events for this encounter.

## 2024-03-06 NOTE — Progress Notes (Signed)
   03/06/24 0700  Level of Consciousness  Level of Consciousness Alert  Oxygen Therapy  O2 Device Room Air  Pain Assessment  Pain Scale 0-10  Pain Score 0  Complaints & Interventions  Complains of Anxiety;Other (Comment) (confusion)    Patient is refusing all medical care, states she wants to leave.  She has pulled out her PIVs and has urinated in the bed.  She will not allow nursing staff to touch her.  I have called the MD and notified the charge RN.

## 2024-03-06 NOTE — Progress Notes (Signed)
  Progress Note   PatientEuline [name] FMW:[id number] DOB: Mar 29, 1946 DOA: 03/02/2024     3 DOS: the patient was seen and examined on 03/06/2024   Brief hospital course: 78 y/o F with a PMH signficant for renal cell carcinoma s/p nephrectomy and a recent hx of PE on anticoagulation who presents for syncopal episode with head trauma found to have submassive PE requiring ICU level care for hemodynamic monitoring.   Assessment and Plan: Acute saddle PE  - Eliquis  10 mg PO bid then 5 mg PO bid  - Norco PRN  - IV dilaudid  1 mg q4 hr PRN    Orthostatic hypotension w/ falls  - PT/OT ordered 03/05/2024   CKD3a  - Stable;monitor    DM2  - Novolog  SS tid    GERD - Protonix  40 mg PO daily    Anxiety  - Cymbalta  60 mg PO daily    Chronic debility  - PT/OT ordered 03/05/2024  8. AMS - Brain MRI with anesthesia completed 03/06/2024 but report is pending - Ammonia is 27 wnl - TSH wnl - UA    9. UTI - IV ceftriaxone  2 g daily  - F/u urine cx   Subjective: Pt seen and examined at the bedside. Pt with rude behavior and AMS today. Brain MRI was completed with the assistance of anesthesia. While the pt was intubated, urine was collected for a UA and blood was collected for ammonia and TSH.  Physical Exam: Vitals:   03/06/24 1412 03/06/24 1415 03/06/24 1421 03/06/24 1436  BP:  (!) 101/44 107/71 128/64  Pulse: 80 86 87 88  Resp: (!) 23 (!) 22 16 16   Temp:    98 F (36.7 C)  TempSrc:    Axillary  SpO2: 99% 98% 99% 98%  Weight:      Height:       HENT:     Head: Normocephalic.     Mouth/Throat:     Mouth: Mucous membranes are moist.  Cardiovascular:     Rate and Rhythm: Normal rate.  Pulmonary:     Effort: Pulmonary effort is normal.  Abdominal:     Palpations: Abdomen is soft.  Musculoskeletal:        General: Normal range of motion.  Skin:    General: Skin is warm.  Neurological:     Mental Status: She is alert. Mental status is at baseline.  Psychiatric:        Mood  and Affect: Mood normal     Disposition: Status is: Inpatient Remains inpatient appropriate because: Awaiting AMS workup   Planned Discharge Destination: Barriers to discharge: As above     Time spent: 35 minutes  Author: Jeremie Abdelaziz , MD 03/06/2024 3:36 PM  For on call review [email or web address].

## 2024-03-07 DIAGNOSIS — I2602 Saddle embolus of pulmonary artery with acute cor pulmonale: Secondary | ICD-10-CM | POA: Diagnosis not present

## 2024-03-07 LAB — COMPREHENSIVE METABOLIC PANEL WITH GFR
ALT: 19 U/L (ref 0–44)
AST: 31 U/L (ref 15–41)
Albumin: 2.5 g/dL — ABNORMAL LOW (ref 3.5–5.0)
Alkaline Phosphatase: 131 U/L — ABNORMAL HIGH (ref 38–126)
Anion gap: 7 (ref 5–15)
BUN: 16 mg/dL (ref 8–23)
CO2: 20 mmol/L — ABNORMAL LOW (ref 22–32)
Calcium: 8.4 mg/dL — ABNORMAL LOW (ref 8.9–10.3)
Chloride: 108 mmol/L (ref 98–111)
Creatinine, Ser: 1.24 mg/dL — ABNORMAL HIGH (ref 0.44–1.00)
GFR, Estimated: 45 mL/min — ABNORMAL LOW (ref >60)
Glucose, Bld: 195 mg/dL — ABNORMAL HIGH (ref 70–99)
Potassium: 3.7 mmol/L (ref 3.5–5.1)
Sodium: 135 mmol/L (ref 135–145)
Total Bilirubin: 1 mg/dL (ref 0.0–1.2)
Total Protein: 5.1 g/dL — ABNORMAL LOW (ref 6.5–8.1)

## 2024-03-07 LAB — CBC
HCT: 31.3 % — ABNORMAL LOW (ref 36.0–46.0)
Hemoglobin: 10.3 g/dL — ABNORMAL LOW (ref 12.0–15.0)
MCH: 30.4 pg (ref 26.0–34.0)
MCHC: 32.9 g/dL (ref 30.0–36.0)
MCV: 92.3 fL (ref 80.0–100.0)
Platelets: 100 K/uL — ABNORMAL LOW (ref 150–400)
RBC: 3.39 MIL/uL — ABNORMAL LOW (ref 3.87–5.11)
RDW: 14 % (ref 11.5–15.5)
WBC: 6.5 K/uL (ref 4.0–10.5)
nRBC: 0 % (ref 0.0–0.2)

## 2024-03-07 LAB — GLUCOSE, CAPILLARY
Glucose-Capillary: 260 mg/dL — ABNORMAL HIGH (ref 70–99)
Glucose-Capillary: 168 mg/dL — ABNORMAL HIGH (ref 70–99)
Glucose-Capillary: 166 mg/dL — ABNORMAL HIGH (ref 70–99)
Glucose-Capillary: 171 mg/dL — ABNORMAL HIGH (ref 70–99)

## 2024-03-07 LAB — MAGNESIUM: Magnesium: 1.9 mg/dL (ref 1.7–2.4)

## 2024-03-07 LAB — C-REACTIVE PROTEIN: CRP: 5.3 mg/dL — ABNORMAL HIGH (ref <1.0)

## 2024-03-07 LAB — PROTIME-INR
INR: 1.7 — ABNORMAL HIGH (ref 0.8–1.2)
Prothrombin Time: 20.8 s — ABNORMAL HIGH (ref 11.4–15.2)

## 2024-03-07 LAB — PHOSPHORUS: Phosphorus: 2.9 mg/dL (ref 2.5–4.6)

## 2024-03-07 NOTE — Progress Notes (Signed)
  Progress Note   PatientTahara [name] FMW:[id number] DOB: Feb 11, 1946 DOA: 03/02/2024     4 DOS: the patient was seen and examined on 03/07/2024   Brief hospital course: 78 y/o F with a PMH signficant for renal cell carcinoma s/p nephrectomy and a recent hx of PE on anticoagulation who presents for syncopal episode with head trauma found to have submassive PE requiring ICU level care for hemodynamic monitoring. Pt was transferred out of ICU. While on the medical floor had AMS and dizziness. Brain MRI was completed with anesthesia assistance. UA revealed UTI (Urine cx growing gram neg bacteria). Now on ceftriaxone  with improved mentation.  Assessment and Plan: Acute saddle PE  - Eliquis  10 mg PO bid then 5 mg PO bid  - Norco PRN  - IV dilaudid  1 mg q4 hr PRN    Orthostatic hypotension w/ falls  - PT/OT ordered 03/05/2024   CKD3a  - Stable;monitor    DM2  - Novolog  SS tid    GERD - Protonix  40 mg PO daily    Anxiety  - Cymbalta  60 mg PO daily    Chronic debility  - PT/OT ordered 03/05/2024   8. AMS - Brain MRI with anesthesia completed 03/06/2024 -->NO evidence of an acute infarct, intracranial hemorrhage, mass, midline shift, or extra-axial fluid collection - Ammonia is 27 wnl - TSH wnl   9. UTI --> Urine cx growing gram negative rods  - IV ceftriaxone  2 g daily  - F/u final C&S   Subjective: Pt seen and examined at the bedside. Mentation and behavior appear improved after the 1st dose of IV ceftriaxone . She will continue on IV ceftriaxone  as the urine cx is growing gram negative rods.   Physical Exam: Vitals:   03/06/24 1620 03/06/24 2017 03/07/24 0500 03/07/24 0751  BP: (!) 117/90 (!) 153/123 (!) 133/59 (!) 131/57  Pulse: 83 74 99 (!) 102  Resp: 18 18 18 20   Temp: 98.2 F (36.8 C) (!) 97.4 F (36.3 C) (!) 97.5 F (36.4 C) 98.5 F (36.9 C)  TempSrc:   Oral   SpO2: 100% 100% 100% 99%  Weight:      Height:       Physical Exam HENT:     Head: Normocephalic.      Comments: L eye bruising     Mouth/Throat:     Mouth: Mucous membranes are moist.  Cardiovascular:     Rate and Rhythm: Normal rate.  Pulmonary:     Effort: Pulmonary effort is normal.  Abdominal:     Palpations: Abdomen is soft.  Musculoskeletal:        General: Normal range of motion.  Skin:    General: Skin is warm.  Neurological:     Mental Status: She is alert.  Psychiatric:        Mood and Affect: Mood normal.       Disposition: Status is: Inpatient Remains inpatient appropriate because: IV antibx  Planned Discharge Destination: Barriers to discharge: IV antibx    Time spent: 35 minutes  Author: Avrom Robarts , MD 03/07/2024 12:29 PM  For on call review [email or web address].

## 2024-03-07 NOTE — Plan of Care (Signed)
  Problem: Education: Goal: Knowledge of General Education information will improve Description: Including pain rating scale, medication(s)/side effects and non-pharmacologic comfort measures Outcome: Progressing   Problem: Clinical Measurements: Goal: Ability to maintain clinical measurements within normal limits will improve Outcome: Progressing   Problem: Coping: Goal: Level of anxiety will decrease Outcome: Progressing   Problem: Elimination: Goal: Will not experience complications related to bowel motility Outcome: Progressing   Problem: Pain Managment: Goal: General experience of comfort will improve and/or be controlled Outcome: Progressing   Problem: Education: Goal: Ability to describe self-care measures that may prevent or decrease complications (Diabetes Survival Skills Education) will improve Outcome: Progressing

## 2024-03-07 NOTE — Evaluation (Signed)
 Occupational Therapy Evaluation Patient Details Name: [name] [name] MRN: [id number] DOB: Aug 07, 1945 Today's Date: 03/07/2024   History of Present Illness   78 yo female arrives to [facility] ED on 03/02/24 for a fall. CT showed PE migrated distally to R pulmonary artery. S/p PE thromectomy w/ placement of IVC filter 8/13. CT Head negative for acute findings. PMH: renal cell carinoma s/p nephrectomy, T2DM, CHF, autoimune hepatitis/cirrhosis, anxiety, GERD     Clinical Impressions At baseline, pt is Independent to Mod I with ADLs, Independent with light meal prep, and performs functional mobility Mod I with a Rollator. At baseline, pt receives assistance from family for other IADLs, including medication management. Pt now presents with decreased activity tolerance, impaired cognition with poor safety awareness and poor insight into deficits, B UE generalized weakness, decreased B UE fine motor coordination, decreased balance, and decreased safety and independence with functional tasks. Pt currently demonstrates ability to largely complete UB ADLs with Set up to Contact guard assist, LB ADLs with Contact guard assist, and functional mobility/transfers with a RW with Contact guard assist. Pt currently requiring Mod-Max cues for safety during functional tasks. Pt reports no dizziness or lightheadedness this day. Pt participated well in session and is motivated to return to PLOF. Pt will benefit from acute skilled OT services to address deficits outlined below and to increase safety and independence with functional tasks. Post acute discharge, pt will benefit from frequent Supervision from family for safety and HH OT for home safety assessment and to maximize rehab potential, decrease risk of falls, and decrease risk of rehospitalization.      If plan is discharge home, recommend the following:   A little help with walking and/or transfers;A little help with bathing/dressing/bathroom;Assistance with  cooking/housework;Direct supervision/assist for medications management;Direct supervision/assist for financial management;Assist for transportation;Help with stairs or ramp for entrance;Supervision due to cognitive status     Functional Status Assessment   Patient has had a recent decline in their functional status and demonstrates the ability to make significant improvements in function in a reasonable and predictable amount of time.     Equipment Recommendations   Other (comment) (RW)     Recommendations for Other Services         Precautions/Restrictions   Precautions Precautions: Fall Recall of Precautions/Restrictions: Impaired Restrictions Weight Bearing Restrictions Per Provider Order: No     Mobility Bed Mobility Overal bed mobility: Needs Assistance Bed Mobility: Supine to Sit     Supine to sit: Supervision, HOB elevated, Used rails     General bed mobility comments: Pt requiring close Supervision ad cues for safety at pt stating she wanted to get out of bed then initiating attempt to climb over bed rails. Pt appropriately followed OT 1-step instructions to wait and for cues to safely transition to sitting EOB.    Transfers Overall transfer level: Needs assistance Equipment used: Rolling walker (2 wheels) Transfers: Sit to/from Stand, Bed to chair/wheelchair/BSC Sit to Stand: Contact guard assist     Step pivot transfers: Contact guard assist     General transfer comment: Pt requiring CGA and cues for safety. Pt with occasional impulsivity with movement and with poor safety awareness and poor insight into deficits.      Balance Overall balance assessment: Needs assistance Sitting-balance support: Single extremity supported, No upper extremity supported, Feet supported Sitting balance-Leahy Scale: Fair Sitting balance - Comments: pt sitting EOB to wash/dry feet and don/doff socks with close Supervision and cues for safety   Standing balance  support: Single extremity supported, Bilateral upper extremity supported, During functional activity, Reliant on assistive device for balance, No upper extremity supported Standing balance-Leahy Scale: Fair Standing balance comment: Pt able to breifly hold statis stand without UE support, requires RW for dynamic standing and ambultation                           ADL either performed or assessed with clinical judgement   ADL Overall ADL's : Needs assistance/impaired Eating/Feeding: Set up;Sitting   Grooming: Contact guard assist;Standing;Cueing for safety;Cueing for sequencing   Upper Body Bathing: Supervision/ safety;Set up;Contact guard assist;Cueing for safety;Cueing for sequencing;Cueing for compensatory techniques;Sitting   Lower Body Bathing: Contact guard assist;Cueing for safety;Cueing for sequencing;Sitting/lateral leans;Sit to/from stand   Upper Body Dressing : Set up;Supervision/safety;Sitting   Lower Body Dressing: Contact guard assist;Sit to/from stand;Cueing for safety   Toilet Transfer: Contact guard assist;Ambulation;BSC/3in1;Rolling walker (2 wheels);Cueing for safety (cues for hand placement/technique)   Toileting- Clothing Manipulation and Hygiene: Contact guard assist;Sit to/from stand;Cueing for safety (cues for hand placement/technique)       Functional mobility during ADLs: Contact guard assist;Rolling walker (2 wheels);Cueing for safety General ADL Comments: Pt with decreased activity tolerance and decreased cognition affecting funcitonal level. Pt also requiring increased time for motor planning during tasks and initiation of tasks.     Vision Baseline Vision/History: 1 Wears glasses (glasses not present at hospital) Ability to See in Adequate Light: 1 Impaired (without glasses) Patient Visual Report: No change from baseline Additional Comments: Pt with noted difficulty reading small print and signs on opporite wall with pt reporting she typically  wears glasses for reading and distance, but that they are not with her. Pt sates she will ask her daughter to bring them to the hospital.     Perception         Praxis         Pertinent Vitals/Pain Pain Assessment Pain Assessment: No/denies pain (pr reports intermittent pain in lower and upper back, but none this session.) Pain Intervention(s): Monitored during session     Extremity/Trunk Assessment Upper Extremity Assessment Upper Extremity Assessment: Right hand dominant;Generalized weakness;RUE deficits/detail;LUE deficits/detail RUE Deficits / Details: generlaized weakness; mildly decreased fine motor coordination; buising noted throughout UE RUE Coordination: decreased fine motor LUE Deficits / Details: generlaized weakness; mildly decreased fine motor coordination; buising noted throughout UE LUE Coordination: decreased fine motor   Lower Extremity Assessment Lower Extremity Assessment: Defer to PT evaluation   Cervical / Trunk Assessment Cervical / Trunk Assessment: Normal   Communication Communication Communication: No apparent difficulties;Other (comment) Factors Affecting Communication: Other (comment) (Pt requiring increased time for processing and word finding and requiring occasional cues to maintain attention to topic/task at hand.)   Cognition Arousal: Alert Behavior During Therapy: [facility] for tasks assessed/performed, Impulsive Cognition: Cognition impaired, No family/caregiver present to determine baseline     Awareness: Intellectual awareness intact, Online awareness impaired (Pt stating she wants to get out of bed then quickly initiating attempt to climb over bed rail. Pt appropriately follow 1-step command to wait for assist of OT to assist.) Memory impairment (select all impairments): Short-term memory, Working memory Attention impairment (select first level of impairment): Selective attention (Easily distracted internally and externally) Executive  functioning impairment (select all impairments): Organization, Reasoning, Problem solving OT - Cognition Comments: Pt AAOx4 and pleasant throughout session with cognitve deficits noted above. Pt with poor safety awareness and poor insight into deficits, but agreeable to and  apprpopriately following 1-step commands to increase safety during task. No family/caregivers available this session to determine pt's baseline cognition.                 Following commands: Impaired Following commands impaired: Only follows one step commands consistently, Follows multi-step commands inconsistently, Follows multi-step commands with increased time     Cueing  General Comments   Cueing Techniques: Verbal cues;Tactile cues;Gestural cues;Visual cues  Pt reports frequent light headedness and dizziness, but reprots no lightheadedness or dizziness this session. NT present during a portion of session.   Exercises     Shoulder Instructions      Home Living Family/patient expects to be discharged to:: Private residence Living Arrangements: Alone Available Help at Discharge: Available PRN/intermittently;Family (Has 4 children who all live within a 20 minute drive of pt. Pt reports her daughter stops by and checks on her most days.) Type of Home: House (Side-by-side Duplex; pt reports the other side is rented by a woman who is close to her age) Home Access: Ramped entrance     Home Layout: One level     Bathroom Shower/Tub: Producer, television/film/video: Handicapped height Bathroom Accessibility: Yes How Accessible: Accessible via walker Home Equipment: Rollator (4 wheels);Cane - single point;Wheelchair - manual;Shower seat - built in (Pt reports she plans to put grab bars in her shower and by her toilet, but they have not yet been installed.)          Prior Functioning/Environment Prior Level of Function : Independent/Modified Independent;Needs assist;History of Falls (last six months)              Mobility Comments: Mod I with a Rollator; pt reports several falls due to syncopal epsiodes over the past 6-8 months, unable to provide an exact number ADLs Comments: Independent to Mod I with ADLs and light meal prep. Family assists with cleaning and other home management tasks, more involved meal prep, medication management, and transportation. Pt reports her daughter sets up a weekly pill planner for her and assists pt with managing her diabetes.    OT Problem List: Decreased strength;Decreased activity tolerance;Impaired balance (sitting and/or standing);Decreased coordination;Decreased cognition;Decreased safety awareness;Decreased knowledge of use of DME or AE;Decreased knowledge of precautions   OT Treatment/Interventions: Self-care/ADL training;Therapeutic exercise;DME and/or AE instruction;Therapeutic activities;Cognitive remediation/compensation;Patient/family education;Balance training      OT Goals(Current goals can be found in the care plan section)   Acute Rehab OT Goals Patient Stated Goal: to feel better, not pass out anymore, and return home OT Goal Formulation: With patient Time For Goal Achievement: 03/21/24 Potential to Achieve Goals: Good ADL Goals Pt Will Perform Grooming: with modified independence;standing Pt Will Perform Lower Body Bathing: with modified independence;sitting/lateral leans;sit to/from stand Pt Will Perform Lower Body Dressing: with modified independence;sitting/lateral leans;sit to/from stand Pt Will Transfer to Toilet: with modified independence;ambulating;regular height toilet (with least restrictive AD) Pt Will Perform Toileting - Clothing Manipulation and hygiene: with modified independence;sit to/from stand;sitting/lateral leans   OT Frequency:  Min 2X/week    Co-evaluation              AM-PAC OT 6 Clicks Daily Activity     Outcome Measure Help from another person eating meals?: A Little Help from another person taking  care of personal grooming?: A Little Help from another person toileting, which includes using toliet, bedpan, or urinal?: A Little Help from another person bathing (including washing, rinsing, drying)?: A Little Help from another person to put on  and taking off regular upper body clothing?: A Little Help from another person to put on and taking off regular lower body clothing?: A Little 6 Click Score: 18   End of Session Equipment Utilized During Treatment: Rolling walker (2 wheels);Gait belt Nurse Communication: Mobility status  Activity Tolerance: Patient tolerated treatment well Patient left: in chair;with call bell/phone within reach;with chair alarm set  OT Visit Diagnosis: Unsteadiness on feet (R26.81);Other abnormalities of gait and mobility (R26.89);Repeated falls (R29.6);Muscle weakness (generalized) (M62.81);History of falling (Z91.81)                Time: 8891-8857 OT Time Calculation (min): 34 min Charges:  OT General Charges $OT Visit: 1 Visit OT Evaluation $OT Eval Moderate Complexity: 1 Mod OT Treatments $Self Care/Home Management : 8-22 mins  [name], OTR/L, MA Acute Rehab [phone]   [name] Horns 03/07/2024, 12:17 PM

## 2024-03-08 ENCOUNTER — Encounter (HOSPITAL_COMMUNITY): Payer: Self-pay | Admitting: Radiology

## 2024-03-08 DIAGNOSIS — I2602 Saddle embolus of pulmonary artery with acute cor pulmonale: Secondary | ICD-10-CM | POA: Diagnosis not present

## 2024-03-08 DIAGNOSIS — I82409 Acute embolism and thrombosis of unspecified deep veins of unspecified lower extremity: Secondary | ICD-10-CM | POA: Insufficient documentation

## 2024-03-08 LAB — COMPREHENSIVE METABOLIC PANEL WITH GFR
ALT: 18 U/L (ref 0–44)
AST: 27 U/L (ref 15–41)
Albumin: 2.5 g/dL — ABNORMAL LOW (ref 3.5–5.0)
Alkaline Phosphatase: 137 U/L — ABNORMAL HIGH (ref 38–126)
Anion gap: 9 (ref 5–15)
BUN: 10 mg/dL (ref 8–23)
CO2: 23 mmol/L (ref 22–32)
Calcium: 8.7 mg/dL — ABNORMAL LOW (ref 8.9–10.3)
Chloride: 108 mmol/L (ref 98–111)
Creatinine, Ser: 1.15 mg/dL — ABNORMAL HIGH (ref 0.44–1.00)
GFR, Estimated: 49 mL/min — ABNORMAL LOW (ref >60)
Glucose, Bld: 199 mg/dL — ABNORMAL HIGH (ref 70–99)
Potassium: 4.5 mmol/L (ref 3.5–5.1)
Sodium: 140 mmol/L (ref 135–145)
Total Bilirubin: 1.2 mg/dL (ref 0.0–1.2)
Total Protein: 5.1 g/dL — ABNORMAL LOW (ref 6.5–8.1)

## 2024-03-08 LAB — CBC
HCT: 31.4 % — ABNORMAL LOW (ref 36.0–46.0)
Hemoglobin: 10.6 g/dL — ABNORMAL LOW (ref 12.0–15.0)
MCH: 31.5 pg (ref 26.0–34.0)
MCHC: 33.8 g/dL (ref 30.0–36.0)
MCV: 93.2 fL (ref 80.0–100.0)
Platelets: 92 K/uL — ABNORMAL LOW (ref 150–400)
RBC: 3.37 MIL/uL — ABNORMAL LOW (ref 3.87–5.11)
RDW: 14.1 % (ref 11.5–15.5)
WBC: 4.6 K/uL (ref 4.0–10.5)
nRBC: 0 % (ref 0.0–0.2)

## 2024-03-08 LAB — URINE CULTURE: Culture: >=100000 — AB

## 2024-03-08 LAB — MAGNESIUM: Magnesium: 1.9 mg/dL (ref 1.7–2.4)

## 2024-03-08 LAB — C-REACTIVE PROTEIN: CRP: 5.1 mg/dL — ABNORMAL HIGH (ref <1.0)

## 2024-03-08 LAB — GLUCOSE, CAPILLARY
Glucose-Capillary: 180 mg/dL — ABNORMAL HIGH (ref 70–99)
Glucose-Capillary: 167 mg/dL — ABNORMAL HIGH (ref 70–99)
Glucose-Capillary: 232 mg/dL — ABNORMAL HIGH (ref 70–99)

## 2024-03-08 LAB — PROTIME-INR
INR: 1.8 — ABNORMAL HIGH (ref 0.8–1.2)
Prothrombin Time: 21.5 s — ABNORMAL HIGH (ref 11.4–15.2)

## 2024-03-08 LAB — PHOSPHORUS: Phosphorus: 3.7 mg/dL (ref 2.5–4.6)

## 2024-03-08 MED ORDER — APIXABAN 5 MG PO TABS: ORAL_TABLET | ORAL | Status: DC

## 2024-03-08 MED ORDER — CEFADROXIL 500 MG PO CAPS: 500.0000 mg | ORAL_CAPSULE | Freq: Two times a day (BID) | ORAL | 0 refills | Status: AC

## 2024-03-08 MED ORDER — SENNOSIDES-DOCUSATE SODIUM 8.6-50 MG PO TABS: 1.0000 | ORAL_TABLET | Freq: Two times a day (BID) | ORAL | Status: AC | PRN

## 2024-03-08 MED ORDER — PREDNISONE 20 MG PO TABS
20.0000 mg | ORAL_TABLET | Freq: Every day | ORAL | Status: DC
  Administered 2024-03-08: 20 mg via ORAL
  Filled 2024-03-08: qty 1

## 2024-03-08 MED ORDER — CEFADROXIL 500 MG PO CAPS
500.0000 mg | ORAL_CAPSULE | Freq: Two times a day (BID) | ORAL | Status: DC
  Administered 2024-03-08: 500 mg via ORAL
  Filled 2024-03-08: qty 1

## 2024-03-08 NOTE — Discharge Summary (Signed)
 Physician Discharge Summary  [name] [name] FMW:[id number] DOB: 08/21/1945 DOA: 03/02/2024  PCP: Avelina Greig [name], MD  Admit date: 03/02/2024 Discharge date: 03/08/24  Admitted From: Home Disposition: Home Recommendations for Outpatient Follow-up:  Outpatient follow-up with PCP in 1 to 2 weeks Check CMP and CBC at follow-up IR to arrange outpatient follow-up Please follow up on the following pending results: None  Home Health: PT/OT Equipment/Devices: Walker  Discharge Condition: Stable CODE STATUS: Full code Diet Orders (From admission, onward)     Start     Ordered   03/08/24 0000  Diet - low sodium heart healthy        03/08/24 0826   03/08/24 0000  Diet Carb Modified        03/08/24 0826   03/03/24 1953  Diet Carb Modified Fluid consistency: Thin; Room service appropriate? Yes  Diet effective now       Question Answer Comment  Diet-HS Snack? Nothing   Calorie Level Medium 1600-2000   Fluid consistency: Thin   Room service appropriate? Yes      03/03/24 1953             Follow-up Information     [facility] IR Imaging Follow up.   Specialty: Radiology Why: Please follow up with Dr. Jennefer in approximately one month for CT and US  imaging and clinic visit. A scheduler from our office will call you with a date/time of your appointment Contact information: [address] [place]  [address] [phone]        Avelina Greig [name], MD. Schedule an appointment as soon as possible for a visit in 1 week(s).   Specialty: Family Medicine Contact information: [address] [place] [address] [phone]         [facility] Follow up.   Why: Home health services will be provided by [facility]. 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  IR consulted and she underwent pulmonary artery thrombectomy and IVC filter placement on 8/13.  Eventually, she was transitioned to starter pack Eliquis  and transferred out of ICU to regular floor.    Patient remained stable on the regular floor.  She was discharged on Eliquis  10 mg twice daily for 3 more days followed by 5 mg twice daily.    Patient also treated for Klebsiella pneumoniae UTI with IV ceftriaxone  for 2 days and discharged on p.o. cefadroxil  for 3 more days.   Home health PT/OT and rolling walker ordered as recommended by therapy.  See individual problem list below for more.   Problems addressed during this hospitalization Acute saddle PE -s/p pulmonary artery thrombectomy -Eliquis  as above  Bilateral lower extremity DVT s/p IVC filter placement -Anticoagulation as above  Autoimmune hepatitis/liver cirrhosis: On p.o. prednisone  10 mg daily. -Resume home prednisone  - Outpatient follow-up  Orthostatic hypotension w/ falls: Likely due to the above.  Able to ambulate 180 feet with walker without distress.  CKD-3A: Stable  Klebsiella pneumoniae UTI -IV ceftriaxone  for 2 days.  Discharged on p.o. cefadroxil  for 3 more days.   DM-2-continue home  meds   GERD - Continue Protonix  40 mg PO daily    Anxiety  - Cymbalta  60 mg PO daily    Chronic debility  - HH PT/OT and rolling walker as above   Acute metabolic encephalopathy: Likely due to the above.  MRI brain on 8/16 without acute finding.  Ammonia and TSH normal.  No focal neurodeficit.  Class I obesity Body mass index is 32.73 kg/m.           Consultations: Critical care Interventional radiology  Time spent 35  minutes  Vital signs Vitals:   03/07/24 1602 03/07/24 1930 03/08/24 0422 03/08/24 0803  BP: 129/63 (!)  113/54 (!) 143/59   Pulse: 93 90 97 (!) 103  Temp: 98.2 F (36.8 C) 97.8 F (36.6 C)  98.7 F (37.1 C)  Resp: 18 16 14 18   Height:      Weight:      SpO2: 98% 100% 100% 99%  TempSrc:  Oral    BMI (Calculated):         Discharge exam  GENERAL: No apparent distress.  Nontoxic. HEENT: MMM.  Vision and hearing grossly intact.  Some bruising around left eye NECK: Supple.  No apparent JVD.  RESP:  No IWOB.  Fair aeration bilaterally. CVS:  RRR. Heart sounds normal.  ABD/GI/GU: BS+. Abd soft, NTND.  MSK/EXT:  Moves extremities. No apparent deformity. No edema.  SKIN: Lateral upper extremity bruising NEURO: Awake and alert. Oriented appropriately.  No apparent focal neuro deficit. PSYCH: Calm. Normal affect.   Discharge Instructions Discharge Instructions     Diet - low sodium heart healthy   Complete by: As directed    Diet Carb Modified   Complete by: As directed    Discharge instructions   Complete by: As directed    It has been a pleasure taking care of you!  You were hospitalized due to pulmonary embolism (blood clot in your lung) for which you have been treated with IVC filter placement and medications.  Continue taking your Eliquis . Also  started on antibiotics for UTI.  We are discharging you on more antibiotics to complete treatment course.  Please review your new medication list and the directions on your medications before you take them.  Follow-up with your primary care doctor in 1 to 2 weeks or sooner if needed.  Follow-up with interventional radiology per their recommendation.    Take care,   Increase activity slowly   Complete by: As directed    No wound care   Complete by: As directed       Allergies as of 03/08/2024       Reactions   Auvi-q  [epinephrine ] Other (See Comments)   Tremors    Codeine  Other (See Comments)   Unknown reaction   E.e.s. [erythromycin] Other (See Comments)   Abdominal spasms   Eucalyptus Oil Other (See Comments)    Unknown reaction   Glucophage  [metformin ] Diarrhea   Iodinated Contrast Media Hives   Iohexol  Hives   Lortab [hydrocodone -acetaminophen ] Nausea Only   Menthol Other (See Comments)   Unknown reaction   Roxicodone  [oxycodone ] Nausea And Vomiting   Tylenol  [acetaminophen ] Other (See Comments)   Hx NASH - told to never take Tylenol  by MD   Vibramycin  [doxycycline ] Hives   Xylocaine  [lidocaine ] Other (See Comments)   Tremors    Tape Rash        Medication List     STOP taking these medications    guaiFENesin  600 MG 12 hr  tablet Commonly known as: Mucinex    nitrofurantoin  (macrocrystal-monohydrate) 100 MG capsule Commonly known as: MACROBID        TAKE these medications    albuterol  108 (90 Base) MCG/ACT inhaler Commonly known as: ProAir  HFA Inhale 2 puffs into the lungs every 6 (six) hours as needed for wheezing or shortness of breath.   apixaban  5 MG Tabs tablet Commonly known as: ELIQUIS  Take 2 tablets (10 mg total) by mouth 2 (two) times daily for 3 days, THEN 1 tablet (5 mg total) 2 (two) times daily. Start taking on: March 08, 2024 What changed: See the new instructions.   atenolol  25 MG tablet Commonly known as: TENORMIN  TAKE 1 TABLET (25 MG TOTAL) BY MOUTH DAILY.   cefadroxil  500 MG capsule Commonly known as: DURICEF Take 1 capsule (500 mg total) by mouth 2 (two) times daily for 3 days.   cetirizine 10 MG tablet Commonly known as: ZYRTEC Take 10 mg by mouth daily.   COQ10 PO Take 1 capsule by mouth daily.   CRANBERRY PO Take 1 capsule by mouth daily.   D-MANNOSE PO Take 2 tablets by mouth daily.   DULoxetine  60 MG capsule Commonly known as: Cymbalta  Take 1 capsule (60 mg total) by mouth daily.   furosemide  40 MG tablet Commonly known as: LASIX  Take 40 mg by mouth daily.   HumaLOG 100 UNIT/ML injection Generic drug: insulin  lispro Inject 100 Units into the skin See admin instructions. Max daily dose 100 units, administered continuously via  pump.   HYDROcodone -acetaminophen  5-325 MG tablet Commonly known as: NORCO/VICODIN Take 1 tablet by mouth 2 (two) times daily as needed for severe pain (pain score 7-10).   hydrOXYzine  25 MG tablet Commonly known as: ATARAX  Take 25 mg by mouth 2 (two) times daily as needed for anxiety.   pantoprazole  40 MG tablet Commonly known as: PROTONIX  TAKE 1 TABLET BY MOUTH EVERY DAY   predniSONE  10 MG tablet Commonly known as: DELTASONE  Take 10 mg by mouth daily with breakfast.   pregabalin  100 MG capsule Commonly known as: LYRICA  Take 100 mg by mouth 2 (two) times daily as needed (neuropathy).   promethazine  12.5 MG tablet Commonly known as: PHENERGAN  Take 12.5 mg by mouth 2 (two) times daily as needed for nausea.   senna-docusate 8.6-50 MG tablet Commonly known as: Senokot-S Take 1-2 tablets by mouth 2 (two) times daily between meals as needed for mild constipation or moderate constipation.   traMADol  50 MG tablet Commonly known as: ULTRAM  Take 1 tablet (50 mg total) by mouth every 8 (eight) hours as needed for severe pain (pain score 7-10). What changed: when to take this               Durable Medical Equipment  (From admission, onward)           Start     Ordered   03/08/24 1244  For home use only DME Walker rolling  Once       Question Answer Comment  Walker: With 5 Inch Wheels   Patient needs a walker to treat with the following condition Ground-level fall      03/08/24 1244             Procedures/Studies: 8/16-pulmonary artery thrombectomy and IVC filter   MR BRAIN WO CONTRAST Result Date: 03/06/2024 CLINICAL DATA:  Altered mental status. EXAM: MRI HEAD WITHOUT CONTRAST TECHNIQUE: Multiplanar, multiecho pulse sequences of the brain and surrounding structures were obtained without intravenous contrast. COMPARISON:  Head CT  03/02/2024 and MRI 10/23/2023 FINDINGS: Brain: There is no evidence of an acute infarct, intracranial hemorrhage, mass, midline  shift, or extra-axial fluid collection. There is mild cerebral atrophy. Patchy T2 hyperintensities in the cerebral white matter and pons are unchanged and nonspecific but compatible with mild chronic small vessel ischemic disease. 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 Vascular: Major intracranial vascular flow voids are preserved. Skull and upper cervical spine: Unremarkable bone marrow signal. Sinuses/Orbits: Unremarkable orbits. Paranasal sinuses and mastoid air cells are clear. Other: Intubated with fluid in the pharynx. Left frontal scalp soft tissue swelling. IMPRESSION: 1. No acute intracranial abnormality. 2. Mild chronic small vessel ischemic disease. Electronically Signed   By: Dasie Hamburg M.D.   On: 03/06/2024 15:50   IR Angiogram Pulmonary Bilateral Selective RESULT     INDICATION: 78 year old female with history of renal cell carcinoma with persistent DVT and symptomatic intermediate low risk pulmonary embolism resulting in periodically syncope. Decision to treat based on recurrent syncope with falls despite intermediate low risk classification. EXAM: 1. Ultrasound-guided vascular access of the right common femoral vein. 2. Selective catheterization and angiography of the pulmonary arteries. 3. Aspiration thrombectomy of the right pulmonary artery. 4. Central manometry. 5. Inferior vena cavogram. 6. Placement of IVC filter. COMPARISON:  02/26/2024, 03/03/2024 MEDICATIONS: 8,000 units heparin , intravenous ANESTHESIA/SEDATION: Moderate (conscious) sedation was employed during this procedure. A total of Versed  1.5 mg and Fentanyl  25 mcg was administered intravenously. Moderate Sedation Time: 67 minutes. The patient's level of consciousness and vital signs were monitored continuously by radiology nursing throughout the procedure under my direct supervision. FLUOROSCOPY TIME:  One hundred fifty-one mGy reference air kerma COMPLICATIONS: None immediate. TECHNIQUE: Informed written  consent was obtained from the patient after a thorough discussion of the procedural risks, benefits and alternatives. All questions were addressed. Maximal Sterile Barrier Technique was utilized including caps, mask, sterile gowns, sterile gloves, sterile drape, hand hygiene and skin antiseptic. A timeout was performed prior to the initiation of the procedure. Preprocedure ultrasound evaluation demonstrated patency of the right common femoral vein. The procedure was planned. Subdermal Local anesthesia was administered 1% lidocaine . A small skin nick was made. Under direct ultrasound visualization, the right common femoral vein was accessed with a 21 gauge micropuncture needle. A permanent ultrasound image was captured and stored in the record. Micropuncture sheath was inserted followed by placement of a Wholey wire was directed to the inferior vena cava under fluoroscopic guidance. Serial dilation was performed with an 8 [place] vascular sheath followed by placement of 2 ProGlides in a pre close fashion at the 10 o'clock and 2 o'clock positions. Over the wire, a 24 [place] Inari sheath was then placed and directed to the inferior vena cava. Over the wire, a double angle pigtail catheter was inserted and under fluoroscopic guidance directed through the right atrium and right ventricle to the main pulmonary artery. The [facility] wire was then advanced to the right inferior lobar pulmonary artery. The pigtail catheter was exchanged for a 5 [place] select catheter which was directed into the right inferior lobar pulmonary artery. The wire was exchanged for a short taper superstiff Amplatz wire. The catheter was removed. A 24 [place] Flowtreiver aspiration catheter was then directed under fluoroscopic guidance to the main pulmonary artery. The inner dilator was removed. Pulmonary manometry was performed which was significant for a mean pulmonary artery pressure of 20 mm Hg. The aspiration catheter was then advanced to the right  pulmonary artery. Multiple aspirations were  performed which yielded a scant acute appearing thrombus. Given inability to aspirate significant volume of thrombus, the T20 curved aspiration catheter was advanced in coaxial fashion and aspiration was performed which again did not produce significant yield after multiple aspirations. The T 20 was removed and the catheter was directed to the right inferior lobar pulmonary artery. The Inari Pad device was then used in attempts to macerate the recalcitrant thrombus with minimal yield into the aspiration catheter. Repeat pulmonary manometry was then performed which demonstrated reduction in mean pulmonary artery pressure to 12 mm Hg. Repeat right pulmonary angiogram demonstrated significantly improved perfusion to the right lower lobe without significant residual thrombus. The aspiration catheter was removed. An Option Elite sheath was placed through the indwelling sheath via the right groin and inferior vena cavagram was performed. This demonstrated patent inferior vena cava which appear normal caliber, appropriate for IVC filter placement. Under fluoroscopic visualization, an infrarenal filter was deployed successfully. Completion inferior vena cavogram demonstrates appropriate position. The catheters were removed. The sheath was then removed and the ProGlides were tied and cut. The patient was transferred back to the ICU in good condition. IMPRESSION: 1. Right pulmonary artery thrombectomy with removal of scant thrombus. 2. Pulmonary manometry demonstrated reduction in mean pulmonary artery pressure from 20 mm Hg to 12 mm Hg. 3. Technically successful infrarenal inferior vena cava filter placement. Ester Sides, MD [facility] Radiology Electronically Signed   By: Ester Sides M.D.   On: 03/04/2024 11:46   IR Angiogram Selective Each Additional Vessel RESULT     INDICATION: 78 year old female with history of renal cell  carcinoma with persistent DVT and symptomatic intermediate low risk pulmonary embolism resulting in periodically syncope. Decision to treat based on recurrent syncope with falls despite intermediate low risk classification. EXAM: 1. Ultrasound-guided vascular access of the right common femoral vein. 2. Selective catheterization and angiography of the pulmonary arteries. 3. Aspiration thrombectomy of the right pulmonary artery. 4. Central manometry. 5. Inferior vena cavogram. 6. Placement of IVC filter. COMPARISON:  02/26/2024, 03/03/2024 MEDICATIONS: 8,000 units heparin , intravenous ANESTHESIA/SEDATION: Moderate (conscious) sedation was employed during this procedure. A total of Versed  1.5 mg and Fentanyl  25 mcg was administered intravenously. Moderate Sedation Time: 67 minutes. The patient's level of consciousness and vital signs were monitored continuously by radiology nursing throughout the procedure under my direct supervision. FLUOROSCOPY TIME:  One hundred fifty-one mGy reference air kerma COMPLICATIONS: None immediate. TECHNIQUE: Informed written consent was obtained from the patient after a thorough discussion of the procedural risks, benefits and alternatives. All questions were addressed. Maximal Sterile Barrier Technique was utilized including caps, mask, sterile gowns, sterile gloves, sterile drape, hand hygiene and skin antiseptic. A timeout was performed prior to the initiation of the procedure. Preprocedure ultrasound evaluation demonstrated patency of the right common femoral vein. The procedure was planned. Subdermal Local anesthesia was administered 1% lidocaine . A small skin nick was made. Under direct ultrasound visualization, the right common femoral vein was accessed with a 21 gauge micropuncture needle. A permanent ultrasound image was captured and stored in the record. Micropuncture sheath was inserted followed by placement of a Wholey wire was directed to the inferior vena cava under  fluoroscopic guidance. Serial dilation was performed with an 8 [place] vascular sheath followed by placement of 2 ProGlides in a pre close fashion at the 10 o'clock and 2 o'clock positions. Over the wire, a 24 [place] Inari sheath was then placed and directed to the inferior vena cava. Over  the wire, a double angle pigtail catheter was inserted and under fluoroscopic guidance directed through the right atrium and right ventricle to the main pulmonary artery. The [facility] wire was then advanced to the right inferior lobar pulmonary artery. The pigtail catheter was exchanged for a 5 [place] select catheter which was directed into the right inferior lobar pulmonary artery. The wire was exchanged for a short taper superstiff Amplatz wire. The catheter was removed. A 24 [place] Flowtreiver aspiration catheter was then directed under fluoroscopic guidance to the main pulmonary artery. The inner dilator was removed. Pulmonary manometry was performed which was significant for a mean pulmonary artery pressure of 20 mm Hg. The aspiration catheter was then advanced to the right pulmonary artery. Multiple aspirations were performed which yielded a scant acute appearing thrombus. Given inability to aspirate significant volume of thrombus, the T20 curved aspiration catheter was advanced in coaxial fashion and aspiration was performed which again did not produce significant yield after multiple aspirations. The T 20 was removed and the catheter was directed to the right inferior lobar pulmonary artery. The Inari Pad device was then used in attempts to macerate the recalcitrant thrombus with minimal yield into the aspiration catheter. Repeat pulmonary manometry was then performed which demonstrated reduction in mean pulmonary artery pressure to 12 mm Hg. Repeat right pulmonary angiogram demonstrated significantly improved perfusion to the right lower lobe without significant residual thrombus. The aspiration catheter was removed. An  Option Elite sheath was placed through the indwelling sheath via the right groin and inferior vena cavagram was performed. This demonstrated patent inferior vena cava which appear normal caliber, appropriate for IVC filter placement. Under fluoroscopic visualization, an infrarenal filter was deployed successfully. Completion inferior vena cavogram demonstrates appropriate position. The catheters were removed. The sheath was then removed and the ProGlides were tied and cut. The patient was transferred back to the ICU in good condition. IMPRESSION: 1. Right pulmonary artery thrombectomy with removal of scant thrombus. 2. Pulmonary manometry demonstrated reduction in mean pulmonary artery pressure from 20 mm Hg to 12 mm Hg. 3. Technically successful infrarenal inferior vena cava filter placement. Ester Sides, MD [facility] Radiology Electronically Signed   By: Ester Sides M.D.   On: 03/04/2024 11:46   IR Angiogram Selective Each Additional Vessel RESULT     INDICATION: 78 year old female with history of renal cell carcinoma with persistent DVT and symptomatic intermediate low risk pulmonary embolism resulting in periodically syncope. Decision to treat based on recurrent syncope with falls despite intermediate low risk classification. EXAM: 1. Ultrasound-guided vascular access of the right common femoral vein. 2. Selective catheterization and angiography of the pulmonary arteries. 3. Aspiration thrombectomy of the right pulmonary artery. 4. Central manometry. 5. Inferior vena cavogram. 6. Placement of IVC filter. COMPARISON:  02/26/2024, 03/03/2024 MEDICATIONS: 8,000 units heparin , intravenous ANESTHESIA/SEDATION: Moderate (conscious) sedation was employed during this procedure. A total of Versed  1.5 mg and Fentanyl  25 mcg was administered intravenously. Moderate Sedation Time: 67 minutes. The patient's level of consciousness and vital signs were monitored  continuously by radiology nursing throughout the procedure under my direct supervision. FLUOROSCOPY TIME:  One hundred fifty-one mGy reference air kerma COMPLICATIONS: None immediate. TECHNIQUE: Informed written consent was obtained from the patient after a thorough discussion of the procedural risks, benefits and alternatives. All questions were addressed. Maximal Sterile Barrier Technique was utilized including caps, mask, sterile gowns, sterile gloves, sterile drape, hand hygiene and skin antiseptic. A timeout was performed prior to the initiation  of the procedure. Preprocedure ultrasound evaluation demonstrated patency of the right common femoral vein. The procedure was planned. Subdermal Local anesthesia was administered 1% lidocaine . A small skin nick was made. Under direct ultrasound visualization, the right common femoral vein was accessed with a 21 gauge micropuncture needle. A permanent ultrasound image was captured and stored in the record. Micropuncture sheath was inserted followed by placement of a Wholey wire was directed to the inferior vena cava under fluoroscopic guidance. Serial dilation was performed with an 8 [place] vascular sheath followed by placement of 2 ProGlides in a pre close fashion at the 10 o'clock and 2 o'clock positions. Over the wire, a 24 [place] Inari sheath was then placed and directed to the inferior vena cava. Over the wire, a double angle pigtail catheter was inserted and under fluoroscopic guidance directed through the right atrium and right ventricle to the main pulmonary artery. The [facility] wire was then advanced to the right inferior lobar pulmonary artery. The pigtail catheter was exchanged for a 5 [place] select catheter which was directed into the right inferior lobar pulmonary artery. The wire was exchanged for a short taper superstiff Amplatz wire. The catheter was removed. A 24 [place] Flowtreiver aspiration catheter was then directed under fluoroscopic guidance to the  main pulmonary artery. The inner dilator was removed. Pulmonary manometry was performed which was significant for a mean pulmonary artery pressure of 20 mm Hg. The aspiration catheter was then advanced to the right pulmonary artery. Multiple aspirations were performed which yielded a scant acute appearing thrombus. Given inability to aspirate significant volume of thrombus, the T20 curved aspiration catheter was advanced in coaxial fashion and aspiration was performed which again did not produce significant yield after multiple aspirations. The T 20 was removed and the catheter was directed to the right inferior lobar pulmonary artery. The Inari Pad device was then used in attempts to macerate the recalcitrant thrombus with minimal yield into the aspiration catheter. Repeat pulmonary manometry was then performed which demonstrated reduction in mean pulmonary artery pressure to 12 mm Hg. Repeat right pulmonary angiogram demonstrated significantly improved perfusion to the right lower lobe without significant residual thrombus. The aspiration catheter was removed. An Option Elite sheath was placed through the indwelling sheath via the right groin and inferior vena cavagram was performed. This demonstrated patent inferior vena cava which appear normal caliber, appropriate for IVC filter placement. Under fluoroscopic visualization, an infrarenal filter was deployed successfully. Completion inferior vena cavogram demonstrates appropriate position. The catheters were removed. The sheath was then removed and the ProGlides were tied and cut. The patient was transferred back to the ICU in good condition. IMPRESSION: 1. Right pulmonary artery thrombectomy with removal of scant thrombus. 2. Pulmonary manometry demonstrated reduction in mean pulmonary artery pressure from 20 mm Hg to 12 mm Hg. 3. Technically successful infrarenal inferior vena cava filter placement. Ester Sides, MD Vascular and Interventional Radiology  Specialists [facility] Radiology Electronically Signed   By: Ester Sides M.D.   On: 03/04/2024 11:46   ECHOCARDIOGRAM LIMITED Result Date: 03/04/2024    ECHOCARDIOGRAM LIMITED REPORT   Patient Name:   [facility] Date of Exam: 03/04/2024 Medical Rec #:  [id number]     Height:       63.0 in Accession #:    [id number]    Weight:       198.0 lb Date of Birth:  11-21-1945      BSA:          1.925 m Patient  Age:    78 years      BP:           106/73 mmHg Patient Gender: F             HR:           77 bpm. Exam Location:  Inpatient Procedure: Cardiac Doppler and Limited Color Doppler (Both Spectral and Color            Flow Doppler were utilized during procedure). Indications:     Pulmonary embolus  History:         Patient has prior history of Echocardiogram examinations, most                  recent 02/27/2024. Pulmonary embolus.  Sonographer:     Benard Stallion Referring Phys:  [id number] [facility] PATEL Diagnosing Phys: Darryle Decent MD IMPRESSIONS  1. Left ventricular ejection fraction, by estimation, is 65 to 70%. The left ventricle has normal function. The left ventricle has no regional wall motion abnormalities. Left ventricular diastolic parameters are consistent with Grade I diastolic dysfunction (impaired relaxation).  2. Right ventricular systolic function is normal. The right ventricular size is normal. There is normal pulmonary artery systolic pressure. The estimated right ventricular systolic pressure is 9.8 mmHg.  3. The mitral valve is grossly normal. No evidence of mitral valve regurgitation. No evidence of mitral stenosis.  4. The aortic valve is tricuspid. Aortic valve regurgitation is not visualized. Aortic valve sclerosis is present, with no evidence of aortic valve stenosis.  5. The inferior vena cava is normal in size with greater than 50% respiratory variability, suggesting right atrial pressure of 3 mmHg. FINDINGS  Left Ventricle: Left ventricular ejection fraction, by estimation, is 65 to 70%.  The left ventricle has normal function. The left ventricle has no regional wall motion abnormalities. The left ventricular internal cavity size was normal in size. There is  no left ventricular hypertrophy. Left ventricular diastolic parameters are consistent with Grade I diastolic dysfunction (impaired relaxation). Right Ventricle: The right ventricular size is normal. No increase in right ventricular wall thickness. Right ventricular systolic function is normal. There is normal pulmonary artery systolic pressure. The tricuspid regurgitant velocity is 1.30 m/s, and  with an assumed right atrial pressure of 3 mmHg, the estimated right ventricular systolic pressure is 9.8 mmHg. Left Atrium: Left atrial size was normal in size. Right Atrium: Right atrial size was normal in size. Pericardium: There is no evidence of pericardial effusion. Mitral Valve: The mitral valve is grossly normal. No evidence of mitral valve stenosis. Tricuspid Valve: The tricuspid valve is grossly normal. Tricuspid valve regurgitation is trivial. No evidence of tricuspid stenosis. Aortic Valve: The aortic valve is tricuspid. Aortic valve regurgitation is not visualized. Aortic valve sclerosis is present, with no evidence of aortic valve stenosis. Aortic valve mean gradient measures 4.0 mmHg. Aortic valve peak gradient measures 7.6  mmHg. Aortic valve area, by VTI measures 3.35 cm. Aorta: The aortic root is normal in size and structure. Venous: The inferior vena cava is normal in size with greater than 50% respiratory variability, suggesting right atrial pressure of 3 mmHg. IAS/Shunts: The atrial septum is grossly normal. LEFT VENTRICLE PLAX 2D LVIDd:         3.60 cm   Diastology LVIDs:         2.30 cm   LV e' medial:    5.98 cm/s LV PW:         1.00 cm  LV E/e' medial:  15.4 LV IVS:        1.00 cm   LV e' lateral:   7.40 cm/s LVOT diam:     2.10 cm   LV E/e' lateral: 12.4 LV SV:         99 LV SV Index:   51 LVOT Area:     3.46 cm  RIGHT  VENTRICLE RV Basal diam:  3.40 cm RV Mid diam:    2.90 cm RV S prime:     13.20 cm/s TAPSE (M-mode): 2.0 cm LEFT ATRIUM           Index        RIGHT ATRIUM          Index LA diam:      2.60 cm 1.35 cm/m   RA Area:     9.04 cm LA Vol (A2C): 22.2 ml 11.53 ml/m  RA Volume:   14.80 ml 7.69 ml/m LA Vol (A4C): 24.8 ml 12.88 ml/m  AORTIC VALVE AV Area (Vmax):    3.20 cm AV Area (Vmean):   3.15 cm AV Area (VTI):     3.35 cm AV Vmax:           137.50 cm/s AV Vmean:          94.350 cm/s AV VTI:            0.296 m AV Peak Grad:      7.6 mmHg AV Mean Grad:      4.0 mmHg LVOT Vmax:         127.00 cm/s LVOT Vmean:        85.900 cm/s LVOT VTI:          0.286 m LVOT/AV VTI ratio: 0.97  AORTA Ao Root diam: 3.20 cm MITRAL VALVE                TRICUSPID VALVE MV Area (PHT): 2.76 cm     TR Peak grad:   6.8 mmHg MV Decel Time: 275 msec     TR Vmax:        130.00 cm/s MV E velocity: 92.00 cm/s MV A velocity: 126.00 cm/s  SHUNTS MV E/A ratio:  0.73         Systemic VTI:  0.29 m                             Systemic Diam: 2.10 cm Darryle Decent MD Electronically signed by Darryle Decent MD Signature Date/Time: 03/04/2024/2:08:59 PM    Final (Updated)    IR IVC FILTER PLMT / S&I PORTER GUID/MOD SED Result Date: 03/04/2024 INDICATION: 78 year old female with history of renal cell carcinoma with persistent DVT and symptomatic intermediate low risk pulmonary embolism resulting in periodically syncope. Decision to treat based on recurrent syncope with falls despite intermediate low risk classification. EXAM: 1. Ultrasound-guided vascular access of the right common femoral vein. 2. Selective catheterization and angiography of the pulmonary arteries. 3. Aspiration thrombectomy of the right pulmonary artery. 4. Central manometry. 5. Inferior vena cavogram. 6. Placement of IVC filter. COMPARISON:  02/26/2024, 03/03/2024 MEDICATIONS: 8,000 units heparin , intravenous ANESTHESIA/SEDATION: Moderate (conscious) sedation was employed during this  procedure. A total of Versed  1.5 mg and Fentanyl  25 mcg was administered intravenously. Moderate Sedation Time: 67 minutes. The patient's level of consciousness and vital signs were monitored continuously by radiology nursing throughout the procedure under my direct supervision. FLUOROSCOPY TIME:  One hundred fifty-one mGy reference  air kerma COMPLICATIONS: None immediate. TECHNIQUE: Informed written consent was obtained from the patient after a thorough discussion of the procedural risks, benefits and alternatives. All questions were addressed. Maximal Sterile Barrier Technique was utilized including caps, mask, sterile gowns, sterile gloves, sterile drape, hand hygiene and skin antiseptic. A timeout was performed prior to the initiation of the procedure. Preprocedure ultrasound evaluation demonstrated patency of the right common femoral vein. The procedure was planned. Subdermal Local anesthesia was administered 1% lidocaine . A small skin nick was made. Under direct ultrasound visualization, the right common femoral vein was accessed with a 21 gauge micropuncture needle. A permanent ultrasound image was captured and stored in the record. Micropuncture sheath was inserted followed by placement of a Wholey wire was directed to the inferior vena cava under fluoroscopic guidance. Serial dilation was performed with an 8 [place] vascular sheath followed by placement of 2 ProGlides in a pre close fashion at the 10 o'clock and 2 o'clock positions. Over the wire, a 24 [place] Inari sheath was then placed and directed to the inferior vena cava. Over the wire, a double angle pigtail catheter was inserted and under fluoroscopic guidance directed through the right atrium and right ventricle to the main pulmonary artery. The [facility] wire was then advanced to the right inferior lobar pulmonary artery. The pigtail catheter was exchanged for a 5 [place] select catheter which was directed into the right inferior lobar pulmonary artery.  The wire was exchanged for a short taper superstiff Amplatz wire. The catheter was removed. A 24 [place] Flowtreiver aspiration catheter was then directed under fluoroscopic guidance to the main pulmonary artery. The inner dilator was removed. Pulmonary manometry was performed which was significant for a mean pulmonary artery pressure of 20 mm Hg. The aspiration catheter was then advanced to the right pulmonary artery. Multiple aspirations were performed which yielded a scant acute appearing thrombus. Given inability to aspirate significant volume of thrombus, the T20 curved aspiration catheter was advanced in coaxial fashion and aspiration was performed which again did not produce significant yield after multiple aspirations. The T 20 was removed and the catheter was directed to the right inferior lobar pulmonary artery. The Inari Pad device was then used in attempts to macerate the recalcitrant thrombus with minimal yield into the aspiration catheter. Repeat pulmonary manometry was then performed which demonstrated reduction in mean pulmonary artery pressure to 12 mm Hg. Repeat right pulmonary angiogram demonstrated significantly improved perfusion to the right lower lobe without significant residual thrombus. The aspiration catheter was removed. An Option Elite sheath was placed through the indwelling sheath via the right groin and inferior vena cavagram was performed. This demonstrated patent inferior vena cava which appear normal caliber, appropriate for IVC filter placement. Under fluoroscopic visualization, an infrarenal filter was deployed successfully. Completion inferior vena cavogram demonstrates appropriate position. The catheters were removed. The sheath was then removed and the ProGlides were tied and cut. The patient was transferred back to the ICU in good condition. IMPRESSION: 1. Right pulmonary artery thrombectomy with removal of scant thrombus. 2. Pulmonary manometry demonstrated reduction in mean  pulmonary artery pressure from 20 mm Hg to 12 mm Hg. 3. Technically successful infrarenal inferior vena cava filter placement. Ester Sides, MD Vascular and Interventional Radiology Specialists [facility] Radiology Electronically Signed   By: Ester Sides M.D.   On: 03/04/2024 11:46   IR THROMBECT PRIM MECH INIT (INCLU) MOD SED Result Date: 03/04/2024 INDICATION: 78 year old female with history of renal cell carcinoma with persistent DVT and symptomatic  intermediate low risk pulmonary embolism resulting in periodically syncope. Decision to treat based on recurrent syncope with falls despite intermediate low risk classification. EXAM: 1. Ultrasound-guided vascular access of the right common femoral vein. 2. Selective catheterization and angiography of the pulmonary arteries. 3. Aspiration thrombectomy of the right pulmonary artery. 4. Central manometry. 5. Inferior vena cavogram. 6. Placement of IVC filter. COMPARISON:  02/26/2024, 03/03/2024 MEDICATIONS: 8,000 units heparin , intravenous ANESTHESIA/SEDATION: Moderate (conscious) sedation was employed during this procedure. A total of Versed  1.5 mg and Fentanyl  25 mcg was administered intravenously. Moderate Sedation Time: 67 minutes. The patient's level of consciousness and vital signs were monitored continuously by radiology nursing throughout the procedure under my direct supervision. FLUOROSCOPY TIME:  One hundred fifty-one mGy reference air kerma COMPLICATIONS: None immediate. TECHNIQUE: Informed written consent was obtained from the patient after a thorough discussion of the procedural risks, benefits and alternatives. All questions were addressed. Maximal Sterile Barrier Technique was utilized including caps, mask, sterile gowns, sterile gloves, sterile drape, hand hygiene and skin antiseptic. A timeout was performed prior to the initiation of the procedure. Preprocedure ultrasound evaluation demonstrated patency of the right common femoral vein. The  procedure was planned. Subdermal Local anesthesia was administered 1% lidocaine . A small skin nick was made. Under direct ultrasound visualization, the right common femoral vein was accessed with a 21 gauge micropuncture needle. A permanent ultrasound image was captured and stored in the record. Micropuncture sheath was inserted followed by placement of a Wholey wire was directed to the inferior vena cava under fluoroscopic guidance. Serial dilation was performed with an 8 [place] vascular sheath followed by placement of 2 ProGlides in a pre close fashion at the 10 o'clock and 2 o'clock positions. Over the wire, a 24 [place] Inari sheath was then placed and directed to the inferior vena cava. Over the wire, a double angle pigtail catheter was inserted and under fluoroscopic guidance directed through the right atrium and right ventricle to the main pulmonary artery. The [facility] wire was then advanced to the right inferior lobar pulmonary artery. The pigtail catheter was exchanged for a 5 [place] select catheter which was directed into the right inferior lobar pulmonary artery. The wire was exchanged for a short taper superstiff Amplatz wire. The catheter was removed. A 24 [place] Flowtreiver aspiration catheter was then directed under fluoroscopic guidance to the main pulmonary artery. The inner dilator was removed. Pulmonary manometry was performed which was significant for a mean pulmonary artery pressure of 20 mm Hg. The aspiration catheter was then advanced to the right pulmonary artery. Multiple aspirations were performed which yielded a scant acute appearing thrombus. Given inability to aspirate significant volume of thrombus, the T20 curved aspiration catheter was advanced in coaxial fashion and aspiration was performed which again did not produce significant yield after multiple aspirations. The T 20 was removed and the catheter was directed to the right inferior lobar pulmonary artery. The Inari Pad device was  then used in attempts to macerate the recalcitrant thrombus with minimal yield into the aspiration catheter. Repeat pulmonary manometry was then performed which demonstrated reduction in mean pulmonary artery pressure to 12 mm Hg. Repeat right pulmonary angiogram demonstrated significantly improved perfusion to the right lower lobe without significant residual thrombus. The aspiration catheter was removed. An Option Elite sheath was placed through the indwelling sheath via the right groin and inferior vena cavagram was performed. This demonstrated patent inferior vena cava which appear normal caliber, appropriate for IVC filter placement. Under fluoroscopic visualization, an  infrarenal filter was deployed successfully. Completion inferior vena cavogram demonstrates appropriate position. The catheters were removed. The sheath was then removed and the ProGlides were tied and cut. The patient was transferred back to the ICU in good condition. IMPRESSION: 1. Right pulmonary artery thrombectomy with removal of scant thrombus. 2. Pulmonary manometry demonstrated reduction in mean pulmonary artery pressure from 20 mm Hg to 12 mm Hg. 3. Technically successful infrarenal inferior vena cava filter placement. Ester Sides, MD Vascular and Interventional Radiology Specialists [facility] Radiology Electronically Signed   By: Ester Sides M.D.   On: 03/04/2024 11:46   IR THROMBECT PRIM MECH ADD (INCLU) MOD SED Result Date: 03/04/2024 INDICATION: 78 year old female with history of renal cell carcinoma with persistent DVT and symptomatic intermediate low risk pulmonary embolism resulting in periodically syncope. Decision to treat based on recurrent syncope with falls despite intermediate low risk classification. EXAM: 1. Ultrasound-guided vascular access of the right common femoral vein. 2. Selective catheterization and angiography of the pulmonary arteries. 3. Aspiration thrombectomy of the right pulmonary artery. 4. Central  manometry. 5. Inferior vena cavogram. 6. Placement of IVC filter. COMPARISON:  02/26/2024, 03/03/2024 MEDICATIONS: 8,000 units heparin , intravenous ANESTHESIA/SEDATION: Moderate (conscious) sedation was employed during this procedure. A total of Versed  1.5 mg and Fentanyl  25 mcg was administered intravenously. Moderate Sedation Time: 67 minutes. The patient's level of consciousness and vital signs were monitored continuously by radiology nursing throughout the procedure under my direct supervision. FLUOROSCOPY TIME:  One hundred fifty-one mGy reference air kerma COMPLICATIONS: None immediate. TECHNIQUE: Informed written consent was obtained from the patient after a thorough discussion of the procedural risks, benefits and alternatives. All questions were addressed. Maximal Sterile Barrier Technique was utilized including caps, mask, sterile gowns, sterile gloves, sterile drape, hand hygiene and skin antiseptic. A timeout was performed prior to the initiation of the procedure. Preprocedure ultrasound evaluation demonstrated patency of the right common femoral vein. The procedure was planned. Subdermal Local anesthesia was administered 1% lidocaine . A small skin nick was made. Under direct ultrasound visualization, the right common femoral vein was accessed with a 21 gauge micropuncture needle. A permanent ultrasound image was captured and stored in the record. Micropuncture sheath was inserted followed by placement of a Wholey wire was directed to the inferior vena cava under fluoroscopic guidance. Serial dilation was performed with an 8 [place] vascular sheath followed by placement of 2 ProGlides in a pre close fashion at the 10 o'clock and 2 o'clock positions. Over the wire, a 24 [place] Inari sheath was then placed and directed to the inferior vena cava. Over the wire, a double angle pigtail catheter was inserted and under fluoroscopic guidance directed through the right atrium and right ventricle to the main  pulmonary artery. The [facility] wire was then advanced to the right inferior lobar pulmonary artery. The pigtail catheter was exchanged for a 5 [place] select catheter which was directed into the right inferior lobar pulmonary artery. The wire was exchanged for a short taper superstiff Amplatz wire. The catheter was removed. A 24 [place] Flowtreiver aspiration catheter was then directed under fluoroscopic guidance to the main pulmonary artery. The inner dilator was removed. Pulmonary manometry was performed which was significant for a mean pulmonary artery pressure of 20 mm Hg. The aspiration catheter was then advanced to the right pulmonary artery. Multiple aspirations were performed which yielded a scant acute appearing thrombus. Given inability to aspirate significant volume of thrombus, the T20 curved aspiration catheter was advanced in coaxial fashion and aspiration was  performed which again did not produce significant yield after multiple aspirations. The T 20 was removed and the catheter was directed to the right inferior lobar pulmonary artery. The Inari Pad device was then used in attempts to macerate the recalcitrant thrombus with minimal yield into the aspiration catheter. Repeat pulmonary manometry was then performed which demonstrated reduction in mean pulmonary artery pressure to 12 mm Hg. Repeat right pulmonary angiogram demonstrated significantly improved perfusion to the right lower lobe without significant residual thrombus. The aspiration catheter was removed. An Option Elite sheath was placed through the indwelling sheath via the right groin and inferior vena cavagram was performed. This demonstrated patent inferior vena cava which appear normal caliber, appropriate for IVC filter placement. Under fluoroscopic visualization, an infrarenal filter was deployed successfully. Completion inferior vena cavogram demonstrates appropriate position. The catheters were removed. The sheath was then removed and  the ProGlides were tied and cut. The patient was transferred back to the ICU in good condition. IMPRESSION: 1. Right pulmonary artery thrombectomy with removal of scant thrombus. 2. Pulmonary manometry demonstrated reduction in mean pulmonary artery pressure from 20 mm Hg to 12 mm Hg. 3. Technically successful infrarenal inferior vena cava filter placement. Ester Sides, MD Vascular and Interventional Radiology Specialists [facility] Radiology Electronically Signed   By: Ester Sides M.D.   On: 03/04/2024 11:46   IR THROMBECT PRIM MECH ADD (INCLU) MOD SED Result Date: 03/04/2024 INDICATION: 78 year old female with history of renal cell carcinoma with persistent DVT and symptomatic intermediate low risk pulmonary embolism resulting in periodically syncope. Decision to treat based on recurrent syncope with falls despite intermediate low risk classification. EXAM: 1. Ultrasound-guided vascular access of the right common femoral vein. 2. Selective catheterization and angiography of the pulmonary arteries. 3. Aspiration thrombectomy of the right pulmonary artery. 4. Central manometry. 5. Inferior vena cavogram. 6. Placement of IVC filter. COMPARISON:  02/26/2024, 03/03/2024 MEDICATIONS: 8,000 units heparin , intravenous ANESTHESIA/SEDATION: Moderate (conscious) sedation was employed during this procedure. A total of Versed  1.5 mg and Fentanyl  25 mcg was administered intravenously. Moderate Sedation Time: 67 minutes. The patient's level of consciousness and vital signs were monitored continuously by radiology nursing throughout the procedure under my direct supervision. FLUOROSCOPY TIME:  One hundred fifty-one mGy reference air kerma COMPLICATIONS: None immediate. TECHNIQUE: Informed written consent was obtained from the patient after a thorough discussion of the procedural risks, benefits and alternatives. All questions were addressed. Maximal Sterile Barrier Technique was utilized including caps, mask, sterile gowns,  sterile gloves, sterile drape, hand hygiene and skin antiseptic. A timeout was performed prior to the initiation of the procedure. Preprocedure ultrasound evaluation demonstrated patency of the right common femoral vein. The procedure was planned. Subdermal Local anesthesia was administered 1% lidocaine . A small skin nick was made. Under direct ultrasound visualization, the right common femoral vein was accessed with a 21 gauge micropuncture needle. A permanent ultrasound image was captured and stored in the record. Micropuncture sheath was inserted followed by placement of a Wholey wire was directed to the inferior vena cava under fluoroscopic guidance. Serial dilation was performed with an 8 [place] vascular sheath followed by placement of 2 ProGlides in a pre close fashion at the 10 o'clock and 2 o'clock positions. Over the wire, a 24 [place] Inari sheath was then placed and directed to the inferior vena cava. Over the wire, a double angle pigtail catheter was inserted and under fluoroscopic guidance directed through the right atrium and right ventricle to the main pulmonary artery. The [facility] wire  was then advanced to the right inferior lobar pulmonary artery. The pigtail catheter was exchanged for a 5 [place] select catheter which was directed into the right inferior lobar pulmonary artery. The wire was exchanged for a short taper superstiff Amplatz wire. The catheter was removed. A 24 [place] Flowtreiver aspiration catheter was then directed under fluoroscopic guidance to the main pulmonary artery. The inner dilator was removed. Pulmonary manometry was performed which was significant for a mean pulmonary artery pressure of 20 mm Hg. The aspiration catheter was then advanced to the right pulmonary artery. Multiple aspirations were performed which yielded a scant acute appearing thrombus. Given inability to aspirate significant volume of thrombus, the T20 curved aspiration catheter was advanced in coaxial fashion  and aspiration was performed which again did not produce significant yield after multiple aspirations. The T 20 was removed and the catheter was directed to the right inferior lobar pulmonary artery. The Inari Pad device was then used in attempts to macerate the recalcitrant thrombus with minimal yield into the aspiration catheter. Repeat pulmonary manometry was then performed which demonstrated reduction in mean pulmonary artery pressure to 12 mm Hg. Repeat right pulmonary angiogram demonstrated significantly improved perfusion to the right lower lobe without significant residual thrombus. The aspiration catheter was removed. An Option Elite sheath was placed through the indwelling sheath via the right groin and inferior vena cavagram was performed. This demonstrated patent inferior vena cava which appear normal caliber, appropriate for IVC filter placement. Under fluoroscopic visualization, an infrarenal filter was deployed successfully. Completion inferior vena cavogram demonstrates appropriate position. The catheters were removed. The sheath was then removed and the ProGlides were tied and cut. The patient was transferred back to the ICU in good condition. IMPRESSION: 1. Right pulmonary artery thrombectomy with removal of scant thrombus. 2. Pulmonary manometry demonstrated reduction in mean pulmonary artery pressure from 20 mm Hg to 12 mm Hg. 3. Technically successful infrarenal inferior vena cava filter placement. Ester Sides, MD [facility] Radiology Electronically Signed   By: Ester Sides M.D.   On: 03/04/2024 11:46   IR US  Guide Vasc Access Right Result Date: 03/04/2024 INDICATION: 78 year old female with history of renal cell carcinoma with persistent DVT and symptomatic intermediate low risk pulmonary embolism resulting in periodically syncope. Decision to treat based on recurrent syncope with falls despite intermediate low risk classification. EXAM:  1. Ultrasound-guided vascular access of the right common femoral vein. 2. Selective catheterization and angiography of the pulmonary arteries. 3. Aspiration thrombectomy of the right pulmonary artery. 4. Central manometry. 5. Inferior vena cavogram. 6. Placement of IVC filter. COMPARISON:  02/26/2024, 03/03/2024 MEDICATIONS: 8,000 units heparin , intravenous ANESTHESIA/SEDATION: Moderate (conscious) sedation was employed during this procedure. A total of Versed  1.5 mg and Fentanyl  25 mcg was administered intravenously. Moderate Sedation Time: 67 minutes. The patient's level of consciousness and vital signs were monitored continuously by radiology nursing throughout the procedure under my direct supervision. FLUOROSCOPY TIME:  One hundred fifty-one mGy reference air kerma COMPLICATIONS: None immediate. TECHNIQUE: Informed written consent was obtained from the patient after a thorough discussion of the procedural risks, benefits and alternatives. All questions were addressed. Maximal Sterile Barrier Technique was utilized including caps, mask, sterile gowns, sterile gloves, sterile drape, hand hygiene and skin antiseptic. A timeout was performed prior to the initiation of the procedure. Preprocedure ultrasound evaluation demonstrated patency of the right common femoral vein. The procedure was planned. Subdermal Local anesthesia was administered 1% lidocaine . A small skin nick was made.  Under direct ultrasound visualization, the right common femoral vein was accessed with a 21 gauge micropuncture needle. A permanent ultrasound image was captured and stored in the record. Micropuncture sheath was inserted followed by placement of a Wholey wire was directed to the inferior vena cava under fluoroscopic guidance. Serial dilation was performed with an 8 [place] vascular sheath followed by placement of 2 ProGlides in a pre close fashion at the 10 o'clock and 2 o'clock positions. Over the wire, a 24 [place] Inari sheath was then  placed and directed to the inferior vena cava. Over the wire, a double angle pigtail catheter was inserted and under fluoroscopic guidance directed through the right atrium and right ventricle to the main pulmonary artery. The [facility] wire was then advanced to the right inferior lobar pulmonary artery. The pigtail catheter was exchanged for a 5 [place] select catheter which was directed into the right inferior lobar pulmonary artery. The wire was exchanged for a short taper superstiff Amplatz wire. The catheter was removed. A 24 [place] Flowtreiver aspiration catheter was then directed under fluoroscopic guidance to the main pulmonary artery. The inner dilator was removed. Pulmonary manometry was performed which was significant for a mean pulmonary artery pressure of 20 mm Hg. The aspiration catheter was then advanced to the right pulmonary artery. Multiple aspirations were performed which yielded a scant acute appearing thrombus. Given inability to aspirate significant volume of thrombus, the T20 curved aspiration catheter was advanced in coaxial fashion and aspiration was performed which again did not produce significant yield after multiple aspirations. The T 20 was removed and the catheter was directed to the right inferior lobar pulmonary artery. The Inari Pad device was then used in attempts to macerate the recalcitrant thrombus with minimal yield into the aspiration catheter. Repeat pulmonary manometry was then performed which demonstrated reduction in mean pulmonary artery pressure to 12 mm Hg. Repeat right pulmonary angiogram demonstrated significantly improved perfusion to the right lower lobe without significant residual thrombus. The aspiration catheter was removed. An Option Elite sheath was placed through the indwelling sheath via the right groin and inferior vena cavagram was performed. This demonstrated patent inferior vena cava which appear normal caliber, appropriate for IVC filter placement. Under  fluoroscopic visualization, an infrarenal filter was deployed successfully. Completion inferior vena cavogram demonstrates appropriate position. The catheters were removed. The sheath was then removed and the ProGlides were tied and cut. The patient was transferred back to the ICU in good condition. IMPRESSION: 1. Right pulmonary artery thrombectomy with removal of scant thrombus. 2. Pulmonary manometry demonstrated reduction in mean pulmonary artery pressure from 20 mm Hg to 12 mm Hg. 3. Technically successful infrarenal inferior vena cava filter placement. Ester Sides, MD [facility] Radiology Electronically Signed   By: Ester Sides M.D.   On: 03/04/2024 11:46   IR US  Guide Vasc Access Right Result Date: 03/04/2024 INDICATION: 78 year old female with history of renal cell carcinoma with persistent DVT and symptomatic intermediate low risk pulmonary embolism resulting in periodically syncope. Decision to treat based on recurrent syncope with falls despite intermediate low risk classification. EXAM: 1. Ultrasound-guided vascular access of the right common femoral vein. 2. Selective catheterization and angiography of the pulmonary arteries. 3. Aspiration thrombectomy of the right pulmonary artery. 4. Central manometry. 5. Inferior vena cavogram. 6. Placement of IVC filter. COMPARISON:  02/26/2024, 03/03/2024 MEDICATIONS: 8,000 units heparin , intravenous ANESTHESIA/SEDATION: Moderate (conscious) sedation was employed during this procedure. A total of Versed  1.5 mg and Fentanyl  25  mcg was administered intravenously. Moderate Sedation Time: 67 minutes. The patient's level of consciousness and vital signs were monitored continuously by radiology nursing throughout the procedure under my direct supervision. FLUOROSCOPY TIME:  One hundred fifty-one mGy reference air kerma COMPLICATIONS: None immediate. TECHNIQUE: Informed written consent was obtained from the patient  after a thorough discussion of the procedural risks, benefits and alternatives. All questions were addressed. Maximal Sterile Barrier Technique was utilized including caps, mask, sterile gowns, sterile gloves, sterile drape, hand hygiene and skin antiseptic. A timeout was performed prior to the initiation of the procedure. Preprocedure ultrasound evaluation demonstrated patency of the right common femoral vein. The procedure was planned. Subdermal Local anesthesia was administered 1% lidocaine . A small skin nick was made. Under direct ultrasound visualization, the right common femoral vein was accessed with a 21 gauge micropuncture needle. A permanent ultrasound image was captured and stored in the record. Micropuncture sheath was inserted followed by placement of a Wholey wire was directed to the inferior vena cava under fluoroscopic guidance. Serial dilation was performed with an 8 [place] vascular sheath followed by placement of 2 ProGlides in a pre close fashion at the 10 o'clock and 2 o'clock positions. Over the wire, a 24 [place] Inari sheath was then placed and directed to the inferior vena cava. Over the wire, a double angle pigtail catheter was inserted and under fluoroscopic guidance directed through the right atrium and right ventricle to the main pulmonary artery. [facility] wire was then advanced to the right inferior lobar pulmonary artery. The pigtail catheter was exchanged for a 5 [place] select catheter which was directed into the right inferior lobar pulmonary artery. The wire was exchanged for a short taper superstiff Amplatz wire. The catheter was removed. A 24 [place] Flowtreiver aspiration catheter was then directed under fluoroscopic guidance to the main pulmonary artery. The inner dilator was removed. Pulmonary manometry was performed which was significant for a mean pulmonary artery pressure of 20 mm Hg. The aspiration catheter was then advanced to the right pulmonary artery. Multiple  aspirations were performed which yielded a scant acute appearing thrombus. Given inability to aspirate significant volume of thrombus, the T20 curved aspiration catheter was advanced in coaxial fashion and aspiration was performed which again did not produce significant yield after multiple aspirations. The T 20 was removed and the catheter was directed to the right inferior lobar pulmonary artery. The Inari Pad device was then used in attempts to macerate the recalcitrant thrombus with minimal yield into the aspiration catheter. Repeat pulmonary manometry was then performed which demonstrated reduction in mean pulmonary artery pressure to 12 mm Hg. Repeat right pulmonary angiogram demonstrated significantly improved perfusion to the right lower lobe without significant residual thrombus. The aspiration catheter was removed. An Option Elite sheath was placed through the indwelling sheath via the right groin and inferior vena cavagram was performed. This demonstrated patent inferior vena cava which appear normal caliber, appropriate for IVC filter placement. Under fluoroscopic visualization, an infrarenal filter was deployed successfully. Completion inferior vena cavogram demonstrates appropriate position. The catheters were removed. The sheath was then removed and the ProGlides were tied and cut. The patient was transferred back to the ICU in good condition. IMPRESSION: 1. Right pulmonary artery thrombectomy with removal of scant thrombus. 2. Pulmonary manometry demonstrated reduction in mean pulmonary artery pressure from 20 mm Hg to 12 mm Hg. 3. Technically successful infrarenal inferior vena cava filter placement. Ester Sides, MD [facility] Radiology Electronically Signed  By: Ester Sides M.D.   On: 03/04/2024 11:46   VAS US  LOWER EXTREMITY VENOUS (DVT) Result Date: 03/03/2024  Lower Venous DVT Study Patient Name:  [name] [name]  Date of Exam:   03/03/2024  Medical Rec #: [id number]      Accession #:    [id number] Date of Birth: 23-May-1946       Patient Gender: F Patient Age:   67 years Exam Location:  [facility] Procedure:      VAS US  LOWER EXTREMITY VENOUS (DVT) Referring Phys: HAMISH PATEL --------------------------------------------------------------------------------  Indications: Pulmonary embolism.  Risk Factors: Confirmed PE. Anticoagulation: Eliquis . Comparison Study: 02/27/2024 - RIGHT:                   - Findings consistent with acute deep vein thrombosis                   involving the mid                   portion of the right posterior tibial veins, and right                   peroneal veins.                   - No cystic structure found in the popliteal fossa.                    LEFT:                   - Findings consistent with partially occlusive acute deep vein                   thrombosis                   involving the left common femoral vein, SF junction, and left                   popliteal                   vein.                   - No cystic structure found in the popliteal fossa.                   - External iliac and distal common iliac are patent Performing Technologist: Cordella Collet RVT  Examination Guidelines: A complete evaluation includes B-mode imaging, spectral Doppler, color Doppler, and power Doppler as needed of all accessible portions of each vessel. Bilateral testing is considered an integral part of a complete examination. Limited examinations for reoccurring indications may be performed as noted. The reflux portion of the exam is performed with the patient in reverse Trendelenburg.  +---------+---------------+---------+-----------+----------+--------------+ RIGHT    CompressibilityPhasicitySpontaneityPropertiesThrombus Aging +---------+---------------+---------+-----------+----------+--------------+ CFV      Full           Yes      Yes                                  +---------+---------------+---------+-----------+----------+--------------+ SFJ      Full                                                        +---------+---------------+---------+-----------+----------+--------------+  FV Prox  Full                                                        +---------+---------------+---------+-----------+----------+--------------+ FV Mid   Full                                                        +---------+---------------+---------+-----------+----------+--------------+ FV DistalFull                                                        +---------+---------------+---------+-----------+----------+--------------+ PFV      Full                                                        +---------+---------------+---------+-----------+----------+--------------+ POP      Full           Yes      Yes                                 +---------+---------------+---------+-----------+----------+--------------+ PTV      Partial                                      Acute          +---------+---------------+---------+-----------+----------+--------------+ PERO     Full                                                        +---------+---------------+---------+-----------+----------+--------------+   +---------+---------------+---------+-----------+----------+-----------------+ LEFT     CompressibilityPhasicitySpontaneityPropertiesThrombus Aging    +---------+---------------+---------+-----------+----------+-----------------+ CFV      Partial        Yes      Yes                  Age Indeterminate +---------+---------------+---------+-----------+----------+-----------------+ SFJ      Partial        Yes      Yes                  Age Indeterminate +---------+---------------+---------+-----------+----------+-----------------+ FV Prox  Full                                                            +---------+---------------+---------+-----------+----------+-----------------+ FV Mid   Full                                                           +---------+---------------+---------+-----------+----------+-----------------+  FV DistalFull                                                           +---------+---------------+---------+-----------+----------+-----------------+ PFV      Full                                                           +---------+---------------+---------+-----------+----------+-----------------+ POP      Full           Yes      Yes                                    +---------+---------------+---------+-----------+----------+-----------------+ PTV      Full                                                           +---------+---------------+---------+-----------+----------+-----------------+ PERO     Full                                                           +---------+---------------+---------+-----------+----------+-----------------+     Summary: RIGHT: - Findings consistent with acute deep vein thrombosis involving the right posterior tibial veins.  - No cystic structure found in the popliteal fossa.  LEFT: - Findings consistent with age indeterminate deep vein thrombosis involving the left common femoral vein, and SF junction.  - No cystic structure found in the popliteal fossa.  *See table(s) above for measurements and observations. Electronically signed by Norman Serve on 03/03/2024 at 3:00:35 PM.    Final    CT Angio Chest PE W and/or Wo Contrast Result Date: 03/03/2024 CLINICAL DATA:  Pulmonary embolism, fall on blood thinners, increasing back pain EXAM: CT ANGIOGRAPHY CHEST WITH CONTRAST TECHNIQUE: Multidetector CT imaging of the chest was performed using the standard protocol during bolus administration of intravenous contrast. Multiplanar CT image reconstructions and MIPs were obtained to evaluate the vascular anatomy.  RADIATION DOSE REDUCTION: This exam was performed according to the departmental dose-optimization program which includes automated exposure control, adjustment of the mA and/or kV according to patient size and/or use of iterative reconstruction technique. CONTRAST:  75mL OMNIPAQUE  IOHEXOL  350 MG/ML SOLN COMPARISON:  02/26/2024 FINDINGS: Cardiovascular: There is adequate opacification of the pulmonary arterial tree. Previously noted saddle embolus has migrated distally into the right main pulmonary artery with near occlusion of the origin of the right upper lobar and right middle lobar pulmonary arteries and extension into the right lower lobar pulmonary artery. The right pulmonary artery appears mildly, progressively dilated measuring up to 2.9 cm in diameter proximal to the level of obstruction. The left pulmonary artery demonstrates no intraluminal filling defect. CT evidence of right heart strain is unchanged (RV/LV =  1.3). Global cardiac size within normal limits. No significant coronary artery calcification. No pericardial effusion. Atherosclerotic calcification within the thoracic aorta. No aortic aneurysm. Mediastinum/Nodes: No enlarged mediastinal, hilar, or axillary lymph nodes. Thyroid  gland, trachea, and esophagus demonstrate no significant findings. Lungs/Pleura: Mean 11 mm microlobulated subpleural nodule within right lower lobe is unchanged (105/5), indeterminate. 6 mm pulmonary nodule within the subpleural left lower lobe (77/5) is unchanged. Lungs are otherwise clear. No pneumothorax or pleural effusion. Upper Abdomen: Cirrhosis. Status post cholecystectomy. 14 mm cystic lesion within the head of the pancreas is unchanged as is a 15 mm cystic lesion within the tail the pancreas (137/4). No acute abnormality. Musculoskeletal: No acute bone abnormality. No lytic or blastic bone lesion. Osseous structures are age appropriate. Review of the MIP images confirms the above findings. IMPRESSION: 1.  Previously noted saddle embolus has migrated distally into the right main pulmonary artery with near occlusion of the origin of the right upper lobar and right middle lobar pulmonary arteries and extension into the right lower lobar pulmonary artery. The right pulmonary artery appears mildly, progressively dilated measuring up to 2.9 cm in diameter proximal to the level of obstruction. CT evidence of right heart strain is unchanged (RV/LV = 1.3). 2. Stable pulmonary nodules measuring up to 11 mm within the right lower lobe, indeterminate. Given the patient's acute issues, short-term imaging follow-up with CT or PET-CT examination is 3 months is recommended for further evaluation. 3. Cirrhosis. 4. Stable cystic lesions within the head and tail the pancreas. Follow-up examination in 1 year is recommended to document stability if clinically indicated. 5. Aortic atherosclerosis. Aortic Atherosclerosis (ICD10-I70.0). Electronically Signed   By: Dorethia Molt M.D.   On: 03/03/2024 00:38   CT Thoracic Spine Wo Contrast Result Date: 03/02/2024 CLINICAL DATA:  Low back pain, trauma; Spine fracture, thoracic, traumatic EXAM: CT THORACIC AND LUMBAR SPINE WITHOUT CONTRAST TECHNIQUE: Multidetector CT imaging of the thoracic and lumbar spine was performed without contrast. Multiplanar CT image reconstructions were also generated. RADIATION DOSE REDUCTION: This exam was performed according to the departmental dose-optimization program which includes automated exposure control, adjustment of the mA and/or kV according to patient size and/or use of iterative reconstruction technique. COMPARISON:  CT chest abdomen pelvis 02/26/2024. FINDINGS: CT THORACIC SPINE FINDINGS Alignment: Normal. Vertebrae: Multilevel continuous anterior osteophyte formation. No acute fracture or focal pathologic process. Paraspinal and other soft tissues: Negative. Disc levels: Multilevel intervertebral disc space vacuum phenomenon. CT LUMBAR SPINE  FINDINGS Segmentation: 5 lumbar type vertebrae. Alignment: Normal. Vertebrae: Multilevel moderate to severe degenerative change of the spine. Posterior disc osteophyte complex formation in the L2-L3 and L5-S1 levels. Moderate to severe osseous central canal stenosis at the L2-L3 level. No acute fracture or focal pathologic process. Paraspinal and other soft tissues: Negative. Disc levels: Multilevel intervertebral disc space vacuum phenomenon. Other: 7 x 4 mm subpleural left lower lobe pulmonary nodule (5:69). Right upper quadrant surgical clips. Severe atherosclerotic plaque. Colonic diverticulosis. Question slight pericolonic fat stranding of an under distended sigmoid colon (6:147). IMPRESSION: CT THORACIC SPINE IMPRESSION 1. No acute displaced fracture or traumatic listhesis of the thoracic spine. CT LUMBAR SPINE IMPRESSION 1. No acute displaced fracture or traumatic listhesis of the lumbar spine. 2. Posterior disc osteophyte complex formation in the L2-L3 and L5-S1 levels. Moderate to severe osseous central canal stenosis at the L2-L3 level. Other imaging findings of potential clinical significance: 1. Question developing/mild sigmoid diverticulitis. Correlate clinically. 2. Left solid pulmonary nodule measuring 6 mm. Per Fleischner Society Guidelines,  recommend a non-contrast Chest CT at 6-12 months. If patient is high risk for malignancy, consider an additional non-contrast Chest CT at 18-24 months. If patient is low risk for malignancy, non-contrast Chest CT at 18-24 months is optional. These guidelines do not apply to immunocompromised patients and patients with cancer. Follow up in patients with significant comorbidities as clinically warranted. For lung cancer screening, adhere to Lung-RADS guidelines. Reference: Radiology. 2017; 284(1):228-43. Electronically Signed   By: Morgane  Naveau M.D.   On: 03/02/2024 18:24   CT Lumbar Spine Wo Contrast Result Date: 03/02/2024 CLINICAL DATA:  Low back pain,  trauma; Spine fracture, thoracic, traumatic EXAM: CT THORACIC AND LUMBAR SPINE WITHOUT CONTRAST TECHNIQUE: Multidetector CT imaging of the thoracic and lumbar spine was performed without contrast. Multiplanar CT image reconstructions were also generated. RADIATION DOSE REDUCTION: This exam was performed according to the departmental dose-optimization program which includes automated exposure control, adjustment of the mA and/or kV according to patient size and/or use of iterative reconstruction technique. COMPARISON:  CT chest abdomen pelvis 02/26/2024. FINDINGS: CT THORACIC SPINE FINDINGS Alignment: Normal. Vertebrae: Multilevel continuous anterior osteophyte formation. No acute fracture or focal pathologic process. Paraspinal and other soft tissues: Negative. Disc levels: Multilevel intervertebral disc space vacuum phenomenon. CT LUMBAR SPINE FINDINGS Segmentation: 5 lumbar type vertebrae. Alignment: Normal. Vertebrae: Multilevel moderate to severe degenerative change of the spine. Posterior disc osteophyte complex formation in the L2-L3 and L5-S1 levels. Moderate to severe osseous central canal stenosis at the L2-L3 level. No acute fracture or focal pathologic process. Paraspinal and other soft tissues: Negative. Disc levels: Multilevel intervertebral disc space vacuum phenomenon. Other: 7 x 4 mm subpleural left lower lobe pulmonary nodule (5:69). Right upper quadrant surgical clips. Severe atherosclerotic plaque. Colonic diverticulosis. Question slight pericolonic fat stranding of an under distended sigmoid colon (6:147). IMPRESSION: CT THORACIC SPINE IMPRESSION 1. No acute displaced fracture or traumatic listhesis of the thoracic spine. CT LUMBAR SPINE IMPRESSION 1. No acute displaced fracture or traumatic listhesis of the lumbar spine. 2. Posterior disc osteophyte complex formation in the L2-L3 and L5-S1 levels. Moderate to severe osseous central canal stenosis at the L2-L3 level. Other imaging findings of  potential clinical significance: 1. Question developing/mild sigmoid diverticulitis. Correlate clinically. 2. Left solid pulmonary nodule measuring 6 mm. Per Fleischner Society Guidelines, recommend a non-contrast Chest CT at 6-12 months. If patient is high risk for malignancy, consider an additional non-contrast Chest CT at 18-24 months. If patient is low risk for malignancy, non-contrast Chest CT at 18-24 months is optional. These guidelines do not apply to immunocompromised patients and patients with cancer. Follow up in patients with significant comorbidities as clinically warranted. For lung cancer screening, adhere to Lung-RADS guidelines. Reference: Radiology. 2017; 284(1):228-43. Electronically Signed   By: Morgane  Naveau M.D.   On: 03/02/2024 18:24   CT HEAD WO CONTRAST ( ) Result Date: 03/02/2024 CLINICAL DATA:  Head trauma, minor (Age >= 65y); Neck trauma (Age >= 65y) EXAM: CT HEAD WITHOUT CONTRAST CT CERVICAL SPINE WITHOUT CONTRAST TECHNIQUE: Multidetector CT imaging of the head and cervical spine was performed following the standard protocol without intravenous contrast. Multiplanar CT image reconstructions of the cervical spine were also generated. RADIATION DOSE REDUCTION: This exam was performed according to the departmental dose-optimization program which includes automated exposure control, adjustment of the mA and/or kV according to patient size and/or use of iterative reconstruction technique. COMPARISON:  CT head and C-spine 12/05/2023 FINDINGS: CT HEAD FINDINGS Brain: No evidence of large-territorial acute infarction. No parenchymal hemorrhage.  No mass lesion. No extra-axial collection. No mass effect or midline shift. No hydrocephalus. Basilar cisterns are patent. Vascular: No hyperdense vessel. Atherosclerotic calcifications are present within the cavernous internal carotid and vertebral arteries. Skull: No acute fracture or focal lesion. Sinuses/Orbits: Paranasal sinuses and mastoid  air cells are clear. The orbits are unremarkable. Other: Left frontal scalp subcutaneus soft tissue emphysema in trace hematoma. CT CERVICAL SPINE FINDINGS Alignment: Normal. Skull base and vertebrae: No acute fracture. No aggressive appearing focal osseous lesion or focal pathologic process. Soft tissues and spinal canal: No prevertebral fluid or swelling. No visible canal hematoma. Upper chest: Unremarkable. Other: Atherosclerotic plaque of the aortic arch and its main branches. IMPRESSION: 1.  No acute intracranial abnormality. 2. No acute displaced fracture or traumatic listhesis of the cervical spine. 3.  Aortic Atherosclerosis (ICD10-I70.0). Electronically Signed   By: Morgane  Naveau M.D.   On: 03/02/2024 18:16   CT CERVICAL SPINE WO CONTRAST Result Date: 03/02/2024 CLINICAL DATA:  Head trauma, minor (Age >= 65y); Neck trauma (Age >= 65y) EXAM: CT HEAD WITHOUT CONTRAST CT CERVICAL SPINE WITHOUT CONTRAST TECHNIQUE: Multidetector CT imaging of the head and cervical spine was performed following the standard protocol without intravenous contrast. Multiplanar CT image reconstructions of the cervical spine were also generated. RADIATION DOSE REDUCTION: This exam was performed according to the departmental dose-optimization program which includes automated exposure control, adjustment of the mA and/or kV according to patient size and/or use of iterative reconstruction technique. COMPARISON:  CT head and C-spine 12/05/2023 FINDINGS: CT HEAD FINDINGS Brain: No evidence of large-territorial acute infarction. No parenchymal hemorrhage. No mass lesion. No extra-axial collection. No mass effect or midline shift. No hydrocephalus. Basilar cisterns are patent. Vascular: No hyperdense vessel. Atherosclerotic calcifications are present within the cavernous internal carotid and vertebral arteries. Skull: No acute fracture or focal lesion. Sinuses/Orbits: Paranasal sinuses and mastoid air cells are clear. The orbits are  unremarkable. Other: Left frontal scalp subcutaneus soft tissue emphysema in trace hematoma. CT CERVICAL SPINE FINDINGS Alignment: Normal. Skull base and vertebrae: No acute fracture. No aggressive appearing focal osseous lesion or focal pathologic process. Soft tissues and spinal canal: No prevertebral fluid or swelling. No visible canal hematoma. Upper chest: Unremarkable. Other: Atherosclerotic plaque of the aortic arch and its main branches. IMPRESSION: 1.  No acute intracranial abnormality. 2. No acute displaced fracture or traumatic listhesis of the cervical spine. 3.  Aortic Atherosclerosis (ICD10-I70.0). Electronically Signed   By: Morgane  Naveau M.D.   On: 03/02/2024 18:16   DG Pelvis Portable Result Date: 03/02/2024 CLINICAL DATA:  Dizziness resulting in a fall. EXAM: PORTABLE PELVIS 1-2 VIEWS COMPARISON:  Chest, abdomen and pelvis CT dated 02/26/2024. FINDINGS: No fracture or dislocation. Lower lumbar spine degenerative changes. Diffuse osteopenia. IMPRESSION: No fracture. Electronically Signed   By: Elspeth Bathe M.D.   On: 03/02/2024 18:00   DG Chest Portable 1 View Result Date: 03/02/2024 CLINICAL DATA:  Dizziness resulting in a fall. EXAM: PORTABLE CHEST 1 VIEW COMPARISON:  05/17/2020 FINDINGS: Poor inspiration. Normal-sized heart. Tortuous and partially calcified thoracic aorta. Clear lungs with normal vascularity. No fracture or pneumothorax. Thoracic spine degenerative changes. Cholecystectomy clips. IMPRESSION: No acute abnormality. Electronically Signed   By: Elspeth Bathe M.D.   On: 03/02/2024 17:58   VAS US  LOWER EXTREMITY VENOUS (DVT) Result Date: 02/27/2024  Lower Venous DVT Study Patient Name:  TAMAIRA CIRIELLO  Date of Exam:   02/27/2024 Medical Rec #: [id number]      Accession #:  [id number] Date of Birth: 1946-07-03       Patient Gender: F Patient Age:   50 years Exam Location:  [facility] Procedure:      VAS US  LOWER EXTREMITY VENOUS (DVT) Referring Phys: RENATO APPLEBAUM  --------------------------------------------------------------------------------  Indications: Pulmonary embolism.  Risk Factors: History of renal cell cancer status post nephrectomy. Comparison Study: Prior negative bilateral LEV done 03/26/17 Performing Technologist: Alberta Lis RVS  Examination Guidelines: A complete evaluation includes B-mode imaging, spectral Doppler, color Doppler, and power Doppler as needed of all accessible portions of each vessel. Bilateral testing is considered an integral part of a complete examination. Limited examinations for reoccurring indications may be performed as noted. The reflux portion of the exam is performed with the patient in reverse Trendelenburg.  +---------+---------------+---------+-----------+----------+-----------------+ RIGHT    CompressibilityPhasicitySpontaneityPropertiesThrombus Aging    +---------+---------------+---------+-----------+----------+-----------------+ CFV      Full           Yes      Yes                                    +---------+---------------+---------+-----------+----------+-----------------+ SFJ      Full                                                           +---------+---------------+---------+-----------+----------+-----------------+ FV Prox  Full                                                           +---------+---------------+---------+-----------+----------+-----------------+ FV Mid   Full                                                           +---------+---------------+---------+-----------+----------+-----------------+ FV DistalFull                                                           +---------+---------------+---------+-----------+----------+-----------------+ PFV      Full                                                           +---------+---------------+---------+-----------+----------+-----------------+ POP      Full           Yes      Yes                                     +---------+---------------+---------+-----------+----------+-----------------+ PTV      Partial  Acute mid portion +---------+---------------+---------+-----------+----------+-----------------+ PERO     Partial                                      Acute mid portion +---------+---------------+---------+-----------+----------+-----------------+   +----------+---------------+---------+-----------+----------+--------------+ LEFT      CompressibilityPhasicitySpontaneityPropertiesThrombus Aging +----------+---------------+---------+-----------+----------+--------------+ CFV       Partial        Yes      No                   Acute          +----------+---------------+---------+-----------+----------+--------------+ SFJ       Partial                                      Acute          +----------+---------------+---------+-----------+----------+--------------+ FV Prox   Full           Yes      Yes                                 +----------+---------------+---------+-----------+----------+--------------+ FV Mid    Full                                                        +----------+---------------+---------+-----------+----------+--------------+ FV Distal Full                                                        +----------+---------------+---------+-----------+----------+--------------+ PFV       Full           Yes      Yes                                 +----------+---------------+---------+-----------+----------+--------------+ POP       Partial        Yes      No                   Acute          +----------+---------------+---------+-----------+----------+--------------+ PTV       Full                                                        +----------+---------------+---------+-----------+----------+--------------+ PERO      Full                                                         +----------+---------------+---------+-----------+----------+--------------+ EIV  Yes      Yes                  patent         +----------+---------------+---------+-----------+----------+--------------+ CIV Distal               Yes      Yes                  patent         +----------+---------------+---------+-----------+----------+--------------+     Summary: RIGHT: - Findings consistent with acute deep vein thrombosis involving the mid portion of the right posterior tibial veins, and right peroneal veins.  - No cystic structure found in the popliteal fossa.  LEFT: - Findings consistent with partially occlusive acute deep vein thrombosis involving the left common femoral vein, SF junction, and left popliteal vein.  - No cystic structure found in the popliteal fossa. - External iliac and distal common iliac are patent  *See table(s) above for measurements and observations. Electronically signed by Norman Serve on 02/27/2024 at 4:36:41 PM.    Final    ECHOCARDIOGRAM COMPLETE Result Date: 02/27/2024    ECHOCARDIOGRAM REPORT   Patient Name:   [facility] Date of Exam: 02/27/2024 Medical Rec #:  [id number]     Height:       63.0 in Accession #:    [id number]    Weight:       184.7 lb Date of Birth:  [birth year]      BSA:          1.870 m Patient Age:    78 years      BP:           112/45 mmHg Patient Gender: F             HR:           65 bpm. Exam Location:  Inpatient Procedure: 2D Echo, Cardiac Doppler and Color Doppler (Both Spectral and Color            Flow Doppler were utilized during procedure). Indications:    I26.02 Pulmonary embolus  History:        Patient has prior history of Echocardiogram examinations, most                 recent 10/20/2023. Abnormal ECG, Arrythmias:Atrial Fibrillation,                 Signs/Symptoms:Chest Pain, Dizziness/Lightheadedness, Syncope                 and Altered Mental Status; Risk Factors:Dyslipidemia and                 Diabetes. Cancer.  OSA. 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  Sonographer:    Ellouise Mose RDCS Referring Phys: [id number] [facility]  Sonographer Comments: Technically difficult study due to poor echo windows and patient is obese. Image acquisition challenging due to patient body habitus. Patient with AMS attempted to turn. Talking throughout exam. IMPRESSIONS  1. Poor acoustic windows limit study.  2. Left ventricular ejection fraction, by estimation, is 65 to 70%. The left ventricle has normal function. The left ventricle has no regional wall motion abnormalities. Left ventricular diastolic parameters are indeterminate.  3. Right ventricular systolic function is normal. The right ventricular size is normal. There is normal pulmonary artery systolic pressure. The estimated right ventricular systolic pressure is 33.4 mmHg.  4. Mild mitral valve regurgitation. Moderate mitral annular calcification.  5. The aortic valve has  an indeterminant number of cusps. Aortic valve regurgitation is not visualized. Aortic valve sclerosis/calcification is present, without any evidence of aortic stenosis. FINDINGS  Left Ventricle: Left ventricular ejection fraction, by estimation, is 65 to 70%. The left ventricle has normal function. The left ventricle has no regional wall motion abnormalities. The left ventricular internal cavity size was normal in size. There is  no left ventricular hypertrophy. Left ventricular diastolic parameters are indeterminate. Right Ventricle: The right ventricular size is normal. Right vetricular wall thickness was not assessed. Right ventricular systolic function is normal. There is normal pulmonary artery systolic pressure. The tricuspid regurgitant velocity is 2.52 m/s, and with an assumed right atrial pressure of 8 mmHg, the estimated right ventricular systolic pressure is 33.4 mmHg. Left Atrium: Left atrial size was normal in size. Right Atrium: Right atrial size was normal in size. Pericardium: There is no evidence of  pericardial effusion. Mitral Valve: There is mild thickening of the mitral valve leaflet(s). Moderate mitral annular calcification. Mild mitral valve regurgitation. Tricuspid Valve: The tricuspid valve is normal in structure. Tricuspid valve regurgitation is trivial. Aortic Valve: The aortic valve has an indeterminant number of cusps. Aortic valve regurgitation is not visualized. Aortic valve sclerosis/calcification is present, without any evidence of aortic stenosis. Pulmonic Valve: The pulmonic valve was not well visualized. Pulmonic valve regurgitation is not visualized. No evidence of pulmonic stenosis. Aorta: The aortic root is normal in size and structure. IAS/Shunts: No atrial level shunt detected by color flow Doppler.  LEFT VENTRICLE PLAX 2D LVIDd:         4.50 cm     Diastology LVIDs:         3.10 cm     LV e' medial:    5.44 cm/s LV PW:         1.10 cm     LV E/e' medial:  13.8 LV IVS:        1.00 cm     LV e' lateral:   7.94 cm/s LVOT diam:     2.10 cm     LV E/e' lateral: 9.5 LV SV:         68 LV SV Index:   36 LVOT Area:     3.46 cm  LV Volumes (MOD) LV vol d, MOD A2C: 66.1 ml LV vol d, MOD A4C: 52.1 ml LV vol s, MOD A2C: 12.5 ml LV vol s, MOD A4C: 17.3 ml LV SV MOD A2C:     53.6 ml LV SV MOD A4C:     52.1 ml LV SV MOD BP:      43.6 ml RIGHT VENTRICLE             IVC RV S prime:     13.40 cm/s  IVC diam: 1.60 cm TAPSE (M-mode): 2.0 cm LEFT ATRIUM             Index        RIGHT ATRIUM           Index LA diam:        4.30 cm 2.30 cm/m   RA Area:     15.90 cm LA Vol (A2C):   18.9 ml 10.11 ml/m  RA Volume:   43.30 ml  23.16 ml/m LA Vol (A4C):   16.4 ml 8.77 ml/m LA Biplane Vol: 18.7 ml 10.00 ml/m  AORTIC VALVE LVOT Vmax:   92.00 cm/s LVOT Vmean:  60.200 cm/s LVOT VTI:    0.196 m  AORTA Ao Asc diam: 3.20  cm MITRAL VALVE               TRICUSPID VALVE MV Area (PHT): 3.08 cm    TR Peak grad:   25.4 mmHg MV Decel Time: 246 msec    TR Vmax:        252.00 cm/s MV E velocity: 75.10 cm/s MV A velocity:  98.80 cm/s  SHUNTS MV E/A ratio:  0.76        Systemic VTI:  0.20 m                            Systemic Diam: 2.10 cm Vina Gull MD Electronically signed by Vina Gull MD Signature Date/Time: 02/27/2024/11:45:24 AM    Final    CT CHEST ABDOMEN PELVIS W CONTRAST Result Date: 02/26/2024 CLINICAL DATA:  Kidney cancer, post nephrectomy, monitor surveillance visit for history of RCC s/p nephrectomy. lung nodules need to monitor. EXAM: CT CHEST, ABDOMEN, AND PELVIS WITH CONTRAST TECHNIQUE: Multidetector CT imaging of the chest, abdomen and pelvis was performed following the standard protocol during bolus administration of intravenous contrast. RADIATION DOSE REDUCTION: This exam was performed according to the departmental dose-optimization program which includes automated exposure control, adjustment of the mA and/or kV according to patient size and/or use of iterative reconstruction technique. CONTRAST:  OMNIPAQUE  IOHEXOL  300 MG/ML  SOLN COMPARISON:  CT chest abdomen pelvis 10/18/2023 FINDINGS: CT CHEST FINDINGS Cardiovascular: There is a main pulmonary artery filling defect extending to the right main pulmonary artery as well as right upper and right middle segmental pulmonary arteries. Normal heart size. No significant pericardial effusion. The thoracic aorta is normal in caliber. No atherosclerotic plaque of the thoracic aorta. No coronary artery calcifications. Mitral annular calcification. Mediastinum/Nodes: No enlarged mediastinal, hilar, or axillary lymph nodes. Thyroid  gland, trachea, and esophagus demonstrate no significant findings. Lungs/Pleura: Stable right lower lobe paravertebral mucous plugging (7:1 20-127). Similar finding within the peripheral left lower lobe (7:104) with associated stable cluster of calcified pulmonary micronodules. Stable pulmonary punctate micronodule within the left lower lobe (7:94). Stable 5 mm left lower lobe subpleural nodule (7:68). Subpleural triangular lymph node along  the left major fissure likely a intrapulmonary lymph node-no further follow-up indicated (7:90). No pulmonary mass. No pleural effusion. No pneumothorax. Musculoskeletal: No chest wall abnormality. No suspicious lytic or blastic osseous lesions. No acute displaced fracture. Multilevel degenerative changes of the spine. CT ABDOMEN PELVIS FINDINGS Hepatobiliary: Persistent mild nodular hepatic contour. No focal liver abnormality. Status post cholecystectomy. No biliary dilatation. Pancreas: Stable hypodense pancreatic lesions. Redemonstration of a 1.4 cm hypodense fluid density lesion along the proximal pancreas, a 1.4 cm fluid dense lesion along the pancreatic tail, a 0.6 cm distal pancreatic body hypodensity (2:61). Normal pancreatic contour. No surrounding inflammatory changes. No main pancreatic ductal dilatation. Spleen: Normal in size without focal abnormality. Adrenals/Urinary Tract: No adrenal nodule bilaterally. Status post left nephrectomy. The right kidney enhances homogeneously. No right renal mass. No hydronephrosis. No hydroureter.  No nephroureterolithiasis. The urinary bladder is unremarkable. On delayed imaging, there is no urothelial wall thickening and there are no filling defects in the opacified portions of the right collecting system. Stomach/Bowel: Stomach is within normal limits. No evidence of bowel wall thickening or dilatation. Colonic diverticulosis. Status post appendectomy. Vascular/Lymphatic: No abdominal aorta or iliac aneurysm. Severe atherosclerotic plaque of the aorta and its branches. No abdominal, pelvic, or inguinal lymphadenopathy. Reproductive: Status post hysterectomy. No adnexal masses. Other: No intraperitoneal free fluid. No  intraperitoneal free gas. No organized fluid collection. Musculoskeletal: No abdominal wall hernia or abnormality. No suspicious lytic or blastic osseous lesions. No acute displaced fracture. Multilevel severe degenerative changes of the spine.  IMPRESSION: 1. Main, right main, and segmental right upper and middle lobe pulmonary emboli. Associated right heart strain. No pulmonary infarction. 2. No findings to suggest recurrent or metastatic malignancy in a patient with prior RCC status post left nephrectomy. Other imaging findings of potential clinical significance: 1. Stable bilateral lower lobe mucous plugging. Associated cluster of stable calcified pulmonary micronodules within the left lower lobe. 2. Couple stable pulmonary micronodules within the left lower lobe measuring up to 5 mm. 3. Stable hypodense pancreatic lesions. 4. Cirrhosis. No focal liver lesions identified. Please note that liver protocol enhanced MR and CT are the most sensitive tests for the screening detection of hepatocellular carcinoma in the high risk setting of cirrhosis. 5. Colonic diverticulosis with no acute diverticulitis. 6.  Aortic Atherosclerosis (ICD10-I70.0). These results were called by telephone at the time of interpretation on 02/26/2024 at 7:59 pm to provider Christina  traige nurse who verbally acknowledged these results. Electronically Signed   By: Morgane  Naveau M.D.   On: 02/26/2024 20:13       The results of significant diagnostics from this hospitalization (including imaging, microbiology, ancillary and laboratory) are listed below for reference.     Microbiology: Recent Results (from the past 240 hours)  MRSA Next Gen by PCR, Nasal     Status: None   Collection Time: 03/03/24  1:38 AM   Specimen: Nasal Mucosa; Nasal Swab  Result Value Ref Range Status   MRSA by PCR Next Gen NOT DETECTED NOT DETECTED Final    Comment: (NOTE) The GeneXpert MRSA Assay (FDA approved for NASAL specimens only), is one component of a comprehensive MRSA colonization surveillance program. It is not intended to diagnose MRSA infection nor to guide or monitor treatment for MRSA infections. Test performance is not FDA approved in patients less than 67  years old. Performed at [facility], [address], [place], [place] [address]   Urine Culture     Status: Abnormal   Collection Time: 03/06/24  3:04 PM   Specimen: Urine, Random  Result Value Ref Range Status   Specimen Description URINE, RANDOM  Final   Special Requests   Final    NONE Reflexed from [id number] Performed at [facility], [address], [place], [place] [address]    Culture >=100,000 COLONIES/mL KLEBSIELLA PNEUMONIAE (A)  Final   Report Status 03/08/2024 FINAL  Final   Organism ID, Bacteria KLEBSIELLA PNEUMONIAE (A)  Final      Susceptibility   Klebsiella pneumoniae - MIC*    AMPICILLIN RESISTANT Resistant     CEFAZOLIN  (URINE) Value in next row Sensitive      2 SENSITIVEThis is a modified FDA-approved test that has been validated and its performance characteristics determined by the reporting laboratory.  This laboratory is certified under the Clinical Laboratory Improvement Amendments CLIA as qualified to perform high complexity clinical laboratory testing.    CEFEPIME Value in next row Sensitive      2 SENSITIVEThis is a modified FDA-approved test that has been validated and its performance characteristics determined by the reporting laboratory.  This laboratory is certified under the Clinical Laboratory Improvement Amendments CLIA as qualified to perform high complexity clinical laboratory testing.    ERTAPENEM Value in next row Sensitive      2 SENSITIVEThis is  a modified FDA-approved test that has been validated and its performance characteristics determined by the reporting laboratory.  This laboratory is certified under the Clinical Laboratory Improvement Amendments CLIA as qualified to perform high complexity clinical laboratory testing.    CEFTRIAXONE  Value in next row Sensitive      2 SENSITIVEThis is a modified FDA-approved test that has been validated and its performance characteristics determined by the reporting laboratory.  This laboratory is  certified under the Clinical Laboratory Improvement Amendments CLIA as qualified to perform high complexity clinical laboratory testing.    CIPROFLOXACIN  Value in next row Intermediate      2 SENSITIVEThis is a modified FDA-approved test that has been validated and its performance characteristics determined by the reporting laboratory.  This laboratory is certified under the Clinical Laboratory Improvement Amendments CLIA as qualified to perform high complexity clinical laboratory testing.    GENTAMICIN Value in next row Sensitive      2 SENSITIVEThis is a modified FDA-approved test that has been validated and its performance characteristics determined by the reporting laboratory.  This laboratory is certified under the Clinical Laboratory Improvement Amendments CLIA as qualified to perform high complexity clinical laboratory testing.    NITROFURANTOIN  Value in next row Resistant      2 SENSITIVEThis is a modified FDA-approved test that has been validated and its performance characteristics determined by the reporting laboratory.  This laboratory is certified under the Clinical Laboratory Improvement Amendments CLIA as qualified to perform high complexity clinical laboratory testing.    TRIMETH /SULFA  Value in next row Sensitive      2 SENSITIVEThis is a modified FDA-approved test that has been validated and its performance characteristics determined by the reporting laboratory.  This laboratory is certified under the Clinical Laboratory Improvement Amendments CLIA as qualified to perform high complexity clinical laboratory testing.    AMPICILLIN/SULBACTAM Value in next row Sensitive      2 SENSITIVEThis is a modified FDA-approved test that has been validated and its performance characteristics determined by the reporting laboratory.  This laboratory is certified under the Clinical Laboratory Improvement Amendments CLIA as qualified to perform high complexity clinical laboratory testing.    PIP/TAZO Value  in next row Sensitive ug/mL     <=4 SENSITIVEThis is a modified FDA-approved test that has been validated and its performance characteristics determined by the reporting laboratory.  This laboratory is certified under the Clinical Laboratory Improvement Amendments CLIA as qualified to perform high complexity clinical laboratory testing.    MEROPENEM Value in next row Sensitive      <=4 SENSITIVEThis is a modified FDA-approved test that has been validated and its performance characteristics determined by the reporting laboratory.  This laboratory is certified under the Clinical Laboratory Improvement Amendments CLIA as qualified to perform high complexity clinical laboratory testing.    * >=100,000 COLONIES/mL KLEBSIELLA PNEUMONIAE     Labs:  CBC: Recent Labs  Lab 03/01/24 1458 03/02/24 1733 03/03/24 0345 03/05/24 0651 03/06/24 0747 03/06/24 1227 03/07/24 1017 03/08/24 0520  WBC 10.8* 8.4   < > 5.7 7.4 7.4 6.5 4.6  NEUTROABS 7.2 5.0  --   --   --   --   --   --   HGB 15.0 14.4   < > 10.8* 11.6* 11.5* 10.3* 10.6*  HCT 44.6 44.0   < > 33.3* 35.4* 34.9* 31.3* 31.4*  MCV 89.2 92.6   < > 93.8 92.4 92.1 92.3 93.2  PLT 201 175   < > 107* 117* 112*  100* 92*   < > = values in this interval not displayed.   BMP &GFR Recent Labs  Lab 03/03/24 0345 03/04/24 0037 03/05/24 0651 03/06/24 0747 03/07/24 1017 03/08/24 0520  NA 138 135 136 136 135 140  K 4.4 4.5 4.2 4.0 3.7 4.5  CL 101 98 103 104 108 108  CO2 26 28 24 24  20* 23  GLUCOSE 225* 401* 308* 190* 195* 199*  BUN 23 25* 25* 18 16 10   CREATININE 1.26* 1.25* 1.33* 1.11* 1.24* 1.15*  CALCIUM 8.8* 8.6* 9.0 9.0 8.4* 8.7*  MG 1.5*  --   --  1.8 1.9 1.9  PHOS 4.3 4.1 3.0 2.4*  2.4* 2.9 3.7   Estimated Creatinine Clearance: 41.4 mL/min (A) (by C-G formula based on SCr of 1.15 mg/dL (H)). Liver & Pancreas: Recent Labs  Lab 03/01/24 1458 03/02/24 1733 03/04/24 0037 03/05/24 0651 03/06/24 0747 03/07/24 1017 03/08/24 0520  AST 42*  42*  --   --   --  31 27  ALT 34 31  --   --   --  19 18  ALKPHOS 205* 177*  --   --   --  131* 137*  BILITOT 1.1 1.0  --   --   --  1.0 1.2  PROT 6.5 5.8*  --   --   --  5.1* 5.1*  ALBUMIN 3.7 3.1* 2.6* 2.5* 2.8* 2.5* 2.5*   No results for input(s): LIPASE, AMYLASE in the last 168 hours. Recent Labs  Lab 03/06/24 1227  AMMONIA 27   Diabetic: No results for input(s): HGBA1C in the last 72 hours. Recent Labs  Lab 03/07/24 1122 03/07/24 1652 03/07/24 2039 03/08/24 0636 03/08/24 1156  GLUCAP 168* 166* 171* 167* 232*   Cardiac Enzymes: No results for input(s): CKTOTAL, CKMB, CKMBINDEX, TROPONINI in the last 168 hours. No results for input(s): PROBNP in the last 8760 hours. Coagulation Profile: Recent Labs  Lab 03/06/24 1227 03/07/24 1017 03/08/24 0520  INR 1.9* 1.7* 1.8*   Thyroid  Function Tests: Recent Labs    03/06/24 1227  TSH 2.955   Lipid Profile: No results for input(s): CHOL, HDL, LDLCALC, TRIG, CHOLHDL, LDLDIRECT in the last 72 hours. Anemia Panel: No results for input(s): VITAMINB12, FOLATE, FERRITIN, TIBC, IRON, RETICCTPCT in the last 72 hours. Urine analysis:    Component Value Date/Time   COLORURINE AMBER (A) 03/06/2024 1504   APPEARANCEUR CLOUDY (A) 03/06/2024 1504   LABSPEC 1.023 03/06/2024 1504   PHURINE 6.0 03/06/2024 1504   GLUCOSEU NEGATIVE 03/06/2024 1504   HGBUR SMALL (A) 03/06/2024 1504   HGBUR moderate 03/28/2010 0931   BILIRUBINUR NEGATIVE 03/06/2024 1504   BILIRUBINUR Negative 09/21/2021 1614   KETONESUR 5 (A) 03/06/2024 1504   PROTEINUR 30 (A) 03/06/2024 1504   UROBILINOGEN 0.2 09/21/2021 1614   UROBILINOGEN 0.2 03/28/2010 0931   NITRITE NEGATIVE 03/06/2024 1504   LEUKOCYTESUR LARGE (A) 03/06/2024 1504   Sepsis Labs: Invalid input(s): PROCALCITONIN, LACTICIDVEN   SIGNED:  Renia Mikelson T Juanitta Earnhardt, MD  [facility] 03/08/2024, 2:33 PM

## 2024-03-08 NOTE — Progress Notes (Signed)
 Mobility Specialist Progress Note:    03/08/24 1100  Mobility  Activity Ambulated with assistance  Level of Assistance Contact guard assist, steadying assist  Assistive Device Front wheel walker  Distance Ambulated (ft) 10 ft  Activity Response Tolerated well  Mobility Referral Yes  Mobility visit 1 Mobility  Mobility Specialist Start Time (ACUTE ONLY) 1010  Mobility Specialist Stop Time (ACUTE ONLY) 1023  Mobility Specialist Time Calculation (min) (ACUTE ONLY) 13 min   Pt received in chair requesting assistance going to the BR. No physical assistance needed. No c/o throughout, no dizziness during session. Pt requested a minute in the BR. Instructed to use call light when finished. All needs met. RN in room.  Thersia Minder Mobility Specialist  Please contact vis Secure Chat or  Rehab Office [phone]

## 2024-03-08 NOTE — Progress Notes (Signed)
 Physical Therapy Treatment Patient Details Name: [name] [name] MRN: [id number] DOB: [birth year] Today's Date: 03/08/2024   History of Present Illness 78 yo female [facility] ED on 03/02/24 for a fall. CT showed PE migrated distally to R pulmonary artery. S/p PE thromectomy w/ placement of IVC filter 8/13. CT Head negative for acute findings. MRI brain negative for acute findings. PMH: renal cell carinoma s/p nephrectomy, T2DM, CHF, autoimune hepatitis/cirrhosis, anxiety, GERD    PT Comments  Pt in recliner at start of session and agreeable to therapy. Pt demonstrates improved functional mobility and activity tolerance during today's session. Pt ambulated household distance with no significant LOB using RW. Cues for walker management were given. PT assessed vertigo with exacerbating activities (return to supine, rolling to R). Pt reports no s/s of vertigo with movement. Pt did state that she had a small bout of vertigo earlier but has improved since last session. HHPT re-test vestibular s/s and to progress activity tolerance and functional mobility. PT recommended increased supervision at home as needed.    If plan is discharge home, recommend the following: Assist for transportation;Help with stairs or ramp for entrance;Assistance with cooking/housework   Can travel by private vehicle        Equipment Recommendations  None recommended by PT    Recommendations for Other Services       Precautions / Restrictions Precautions Precautions: Fall Recall of Precautions/Restrictions: Impaired Restrictions Weight Bearing Restrictions Per Provider Order: No     Mobility  Bed Mobility Overal bed mobility: Needs Assistance Bed Mobility: Supine to Sit, Sit to Supine     Supine to sit: Used rails, Supervision Sit to supine: Min assist, Used rails   General bed mobility comments: Pt requiring increased time to get into bed using bed rails. MinA for trunk management to get out of bed. Pt states  that she has railing for her bed at home    Transfers Overall transfer level: Needs assistance Equipment used: Rolling walker (2 wheels) Transfers: Sit to/from Stand Sit to Stand: Contact guard assist           General transfer comment: Sit to stand x3 (2x from recliner, 1x from EOB). Pt with occasional impulsivity with movement and poor safety awareness.    Ambulation/Gait Ambulation/Gait assistance: Supervision Gait Distance (Feet): 180 Feet Assistive device: Rolling walker (2 wheels) Gait Pattern/deviations: Step-through pattern, Shuffle, Trunk flexed, Antalgic, Decreased stride length       General Gait Details: No overt LOB, cues to keep stay inside RW. Pt uses rollator at baseline.   Stairs             Wheelchair Mobility     Tilt Bed    Modified Rankin (Stroke Patients Only)       Balance Overall balance assessment: Needs assistance Sitting-balance support: Single extremity supported, No upper extremity supported, Feet supported Sitting balance-Leahy Scale: Fair Sitting balance - Comments: Pt sat EOB w/ no LOB   Standing balance support: Bilateral upper extremity supported, During functional activity, Reliant on assistive device for balance Standing balance-Leahy Scale: Fair Standing balance comment: Requires AD for support or will furniture surf to steady                            Communication Communication Communication: No apparent difficulties Factors Affecting Communication:  (Pt requiring increased time for processing and word finding and requiring occasional cues to maintain attention to topic/task at hand.)  Cognition Arousal:  Alert Behavior During Therapy: [facility] for tasks assessed/performed, Impulsive                             Following commands: Impaired Following commands impaired: Only follows one step commands consistently, Follows multi-step commands inconsistently, Follows multi-step commands with  increased time    Cueing Cueing Techniques: Verbal cues, Tactile cues, Gestural cues  Exercises      General Comments General comments (skin integrity, edema, etc.): VSS on RA. No complaints of headache or dizziness.      Pertinent Vitals/Pain Pain Assessment Pain Assessment: Faces Faces Pain Scale: Hurts a little bit Pain Location: Back and ribs Pain Descriptors / Indicators: Sore Pain Intervention(s): Monitored during session, Limited activity within patient's tolerance    Home Living                          Prior Function            PT Goals (current goals can now be found in the care plan section) Acute Rehab PT Goals Patient Stated Goal: Regain independence PT Goal Formulation: With patient Time For Goal Achievement: 03/19/24 Potential to Achieve Goals: Good Progress towards PT goals: Progressing toward goals    Frequency    Min 3X/week      PT Plan      Co-evaluation              AM-PAC PT 6 Clicks Mobility   Outcome Measure  Help needed turning from your back to your side while in a flat bed without using bedrails?: A Little Help needed moving from lying on your back to sitting on the side of a flat bed without using bedrails?: A Little Help needed moving to and from a bed to a chair (including a wheelchair)?: A Little Help needed standing up from a chair using your arms (e.g., wheelchair or bedside chair)?: A Little Help needed to walk in hospital room?: A Little Help needed climbing 3-5 steps with a railing? : A Little 6 Click Score: 18    End of Session Equipment Utilized During Treatment: Gait belt Activity Tolerance: Patient tolerated treatment well Patient left: in chair;with call bell/phone within reach;with chair alarm set Nurse Communication: Mobility status PT Visit Diagnosis: Difficulty in walking, not elsewhere classified (R26.2);Muscle weakness (generalized) (M62.81);History of falling (Z91.81)     Time:  8884-8863 PT Time Calculation (min) (ACUTE ONLY): 21 min  Charges:    $Gait Training: 8-22 mins PT General Charges $$ ACUTE PT VISIT: 1 Visit                     Quintin Campi, SPT  Acute Rehab  [phone]    Quintin Campi 03/08/2024, 1:32 PM

## 2024-03-08 NOTE — TOC Transition Note (Incomplete)
 Transition of Care [facility]) - Discharge Note   Patient Details  Name: [name] [name] MRN: [id number] Date of Birth: 1946-01-11  [facility] CM/SW Contact:  Rosalva Jon Bloch, RN Phone Number: 03/08/2024, 12:23 PM   Clinical Narrative:    Patient will DC to: Anticipated DC date: Family notified: Transport by:   Per MD patient ready for DC to . RN, patient, and patient's family, and notified of DC.   RNCM will sign off for now as intervention is no longer needed. Please consult us  again if new needs arise.    Final next level of care: Home w Home Health Services Barriers to Discharge: No Barriers Identified   Patient Goals and CMS Choice     Choice offered to / list presented to : Patient      Discharge Placement                       Discharge Plan and Services Additional resources added to the After Visit Summary for                            [facility] Arranged: PT, OT HH Agency: [facility] Date HH Agency Contacted: 03/08/24 Time HH Agency Contacted: 1212 Representative spoke with at [facility] Agency: Donette  Social Drivers of Health (SDOH) Interventions SDOH Screenings   Food Insecurity: No Food Insecurity (03/03/2024)  Housing: Low Risk  (03/03/2024)  Transportation Needs: No Transportation Needs (03/03/2024)  Utilities: Not At Risk (03/03/2024)  Depression (PHQ2-9): High Risk (07/11/2023)  Financial Resource Strain: Low Risk  (01/14/2022)  Physical Activity: Inactive (01/14/2022)  Social Connections: Moderately Integrated (03/03/2024)  Stress: No Stress Concern Present (01/14/2022)  Tobacco Use: Low Risk  (03/06/2024)     Readmission Risk Interventions     No data to display

## 2024-03-08 NOTE — Anesthesia Postprocedure Evaluation (Signed)
 Anesthesia Post Note  Patient: [name] [name]  Procedure(s) Performed: MRI WITH ANESTHESIA     Patient location during evaluation: PACU Anesthesia Type: General Level of consciousness: awake and alert Pain management: pain level controlled Vital Signs Assessment: post-procedure vital signs reviewed and stable Respiratory status: spontaneous breathing, nonlabored ventilation, respiratory function stable and patient connected to nasal cannula oxygen Cardiovascular status: blood pressure returned to baseline and stable Postop Assessment: no apparent nausea or vomiting Anesthetic complications: no   There were no known notable events for this encounter.  Last Vitals:  Vitals:   03/08/24 0422 03/08/24 0803  BP: (!) 143/59   Pulse: 97 (!) 103  Resp: 14 18  Temp:  37.1 C  SpO2: 100% 99%    Last Pain:  Vitals:   03/08/24 1032  TempSrc:   PainSc: 0-No pain                 Jovahn Breit S

## 2024-03-08 NOTE — Plan of Care (Signed)
  Problem: Education: Goal: Knowledge of General Education information will improve Description: Including pain rating scale, medication(s)/side effects and non-pharmacologic comfort measures Outcome: Progressing   Problem: Clinical Measurements: Goal: Ability to maintain clinical measurements within normal limits will improve Outcome: Progressing Goal: Diagnostic test results will improve Outcome: Progressing Goal: Respiratory complications will improve Outcome: Progressing   Problem: Elimination: Goal: Will not experience complications related to bowel motility Outcome: Progressing Goal: Will not experience complications related to urinary retention Outcome: Progressing   Problem: Fluid Volume: Goal: Ability to maintain a balanced intake and output will improve Outcome: Progressing   Problem: Metabolic: Goal: Ability to maintain appropriate glucose levels will improve Outcome: Progressing   Problem: Nutritional: Goal: Maintenance of adequate nutrition will improve Outcome: Progressing

## 2024-03-09 DIAGNOSIS — I951 Orthostatic hypotension: Secondary | ICD-10-CM | POA: Diagnosis not present

## 2024-03-09 DIAGNOSIS — Z794 Long term (current) use of insulin: Secondary | ICD-10-CM | POA: Diagnosis not present

## 2024-03-09 DIAGNOSIS — F0393 Unspecified dementia, unspecified severity, with mood disturbance: Secondary | ICD-10-CM | POA: Diagnosis not present

## 2024-03-09 DIAGNOSIS — Z87440 Personal history of urinary (tract) infections: Secondary | ICD-10-CM | POA: Diagnosis not present

## 2024-03-09 DIAGNOSIS — Z905 Acquired absence of kidney: Secondary | ICD-10-CM | POA: Diagnosis not present

## 2024-03-09 DIAGNOSIS — Z9181 History of falling: Secondary | ICD-10-CM | POA: Diagnosis not present

## 2024-03-09 DIAGNOSIS — Z7952 Long term (current) use of systemic steroids: Secondary | ICD-10-CM | POA: Diagnosis not present

## 2024-03-09 DIAGNOSIS — Z7901 Long term (current) use of anticoagulants: Secondary | ICD-10-CM | POA: Diagnosis not present

## 2024-03-09 DIAGNOSIS — R5381 Other malaise: Secondary | ICD-10-CM | POA: Diagnosis not present

## 2024-03-09 DIAGNOSIS — F0394 Unspecified dementia, unspecified severity, with anxiety: Secondary | ICD-10-CM | POA: Diagnosis not present

## 2024-03-09 DIAGNOSIS — N1831 Chronic kidney disease, stage 3a: Secondary | ICD-10-CM | POA: Diagnosis not present

## 2024-03-09 DIAGNOSIS — F32A Depression, unspecified: Secondary | ICD-10-CM | POA: Diagnosis not present

## 2024-03-09 DIAGNOSIS — Z79891 Long term (current) use of opiate analgesic: Secondary | ICD-10-CM | POA: Diagnosis not present

## 2024-03-09 DIAGNOSIS — K759 Inflammatory liver disease, unspecified: Secondary | ICD-10-CM | POA: Diagnosis not present

## 2024-03-09 DIAGNOSIS — S51812D Laceration without foreign body of left forearm, subsequent encounter: Secondary | ICD-10-CM | POA: Diagnosis not present

## 2024-03-09 DIAGNOSIS — I2602 Saddle embolus of pulmonary artery with acute cor pulmonale: Secondary | ICD-10-CM | POA: Diagnosis not present

## 2024-03-09 DIAGNOSIS — I5030 Unspecified diastolic (congestive) heart failure: Secondary | ICD-10-CM | POA: Diagnosis not present

## 2024-03-09 DIAGNOSIS — Z556 Problems related to health literacy: Secondary | ICD-10-CM | POA: Diagnosis not present

## 2024-03-09 DIAGNOSIS — Z8616 Personal history of COVID-19: Secondary | ICD-10-CM | POA: Diagnosis not present

## 2024-03-09 DIAGNOSIS — Z85528 Personal history of other malignant neoplasm of kidney: Secondary | ICD-10-CM | POA: Diagnosis not present

## 2024-03-09 DIAGNOSIS — N390 Urinary tract infection, site not specified: Secondary | ICD-10-CM | POA: Diagnosis not present

## 2024-03-09 DIAGNOSIS — K7460 Unspecified cirrhosis of liver: Secondary | ICD-10-CM | POA: Diagnosis not present

## 2024-03-09 DIAGNOSIS — K219 Gastro-esophageal reflux disease without esophagitis: Secondary | ICD-10-CM | POA: Diagnosis not present

## 2024-03-09 DIAGNOSIS — I4891 Unspecified atrial fibrillation: Secondary | ICD-10-CM | POA: Diagnosis not present

## 2024-03-09 DIAGNOSIS — E1122 Type 2 diabetes mellitus with diabetic chronic kidney disease: Secondary | ICD-10-CM | POA: Diagnosis not present

## 2024-03-10 LAB — POCT ACTIVATED CLOTTING TIME
Activated Clotting Time: 170 s
Activated Clotting Time: 285 s

## 2024-03-15 ENCOUNTER — Ambulatory Visit: Payer: Self-pay | Admitting: *Deleted

## 2024-03-15 NOTE — Telephone Encounter (Signed)
 I spoke with [name] [name] (DPR signed) pt recently in hospital for  thrombectomy (ICV filter placed)due to PE and clots in leg and UTI; pt falling on and off for awhile; pt can get dizzy and then pass out or pt can not have warning and pass out. Most recent loss of consciousness and fall was earlier today. Pt has knot on forehead but pt denies H/A, CP or SOB and no dizziness at this time. Face is also bruised from fall last week. Pt is presently taking eliquis  5 mg bid. No BP taken and [name] said when she leaves work she is going to go stay with pt this evening and will ck BP. [name] said pt is not confused. [name] spoke with pt while I ws on phone and pt still refusing to go to ED. Pt already has appt with Dr Avelina on 03/16/24 at 10:40 and pt will keep that appt. UC & ED precautions given again and [name] voiced understanding and said she would monitor pt closely. 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

## 2024-03-15 NOTE — Telephone Encounter (Signed)
 Copied from CRM #[id number]. Topic: Clinical - Red Word Triage >> Mar 15, 2024  3:39 PM [name] [name] wrote: [facility] that prompted transfer to Nurse Triage: Patient has fallen twice, with the most recent fall being today. Patient was in the ER and release last Wednesday. Daughter is wanting to schedule a hospital follow up since patient is refusing the ER. Reason for Disposition  Sounds like a serious injury to the triager  Answer Assessment - Initial Assessment Questions 1. MECHANISM: How did the fall happen?     Daughter calling in.  [name] [name].   She fell today.  She bumped her head.  I can't convince her to go to the ED.   I'm checking on her every 2 hours.    She is on blood thinners.  She has been having several falls for months now.   They was in the hospital recently.   2 weeks ago 8/12 I took her in for a CT scan for cancer reoccurrence.   We were on the way out of the hospital and they came and got us  because she had a big blood clot in her lung.  They admitted her.   They did a thrombectomy.  She has clots in her legs, UTI, blood clot in he lungs.     She is home now.   She has a home health nurse that comes out.   She is losing consciousness and falling.  She doesn't know how she fell this morning.   Last thing she remembers and pulling up her pants.   Doesn't remember what made her fall.    Said she orthostatic hypotension.   I'm very concerned about her especially with her being on blood thinners.  When she fell this morning she hit her head.  She hit above her eyebrow.  She has a knot there.  It had not gotten larger.    She was in the bathroom.   No cuts but a knot on her head.    She is refusing to go to the today to the ED.   She is not confused.  I'm went at lunch checking on her   She seems ok.   Her face is bruised from the fall last week.       2. DOMESTIC VIOLENCE AND ELDER ABUSE SCREENING: Did you fall because someone pushed you or tried to hurt you? If Yes, ask: Are you safe  now?     N/A 3. ONSET: When did the fall happen? (e.g., minutes, hours, or days ago)     This morning.   All of her falls she doesn't remember before she falls.   Suddenly she gets dizzy and passes or passes out without being dizzy.      4. LOCATION: What part of the body hit the ground? (e.g., [name], buttocks, head, hips, knees, hands, head, stomach)     Hit her head above her eyebrow and has a knot.    They did the thrombectomy thinking it was causing the falls.   5. INJURY: Did you hurt (injure) yourself when you fell? If Yes, ask: What did you injure? Tell me more about this? (e.g., body area; type of injury; pain severity)     Yes  6. PAIN: Is there any pain? If Yes, ask: How bad is the pain? (e.g., Scale 0-10; or none, mild,      No headaches or pain 7. SIZE: For cuts, bruises, or swelling, ask: How large is it? (e.g.,  inches or centimeters)      knot 8. PREGNANCY: Is there any chance you are pregnant? When was your last menstrual period?     N/A due toage 9. OTHER SYMPTOMS: Do you have any other symptoms? (e.g., dizziness, fever, weakness; new-onset or worsening).     See  above 10. CAUSE: What do you think caused the fall (or falling)? (e.g., dizzy spell, tripped)       They thought it was from the blood clot in her lung.   But she has fallen again.  Protocols used: Falls and [facility], daughter called in to make an appt with [name] [name] for her mother.   Pt fell this morning in the bathroom hitting her head above her eyebrow.   Pt does not remember the fall.   She has been having these falls and was recently in the hospital for a pulmonary embolus.  Had a thrombectomy done and they thought this would take care of he falling.  She is on Eliquis .   [name] [name] and pt's home health nurse who saw her today advised pt to go to the ED.   Pt is refusing since she has been in the hospital twice recently.   She does have orthostatic hypotension which could be  contributing to the falls.   Today when she fell she doesn't remember anything about the fall.    [name] has been checking on her mother every 2 hours today and went to her house during lunch and spent an hour with her mother.   She is alert, oriented and conversing fine.   She does have a knot over her eyebrow that has not enlarged but there is a knot. [name] [name] is aware of these issues however when I called into the practice and spoke with [name] [name] [name] [name] is not in the office today.   I told her the situation of pt refusing to go to the ED.  She is getting this information to [name] [name] [name] [name] and they are going to call [name] [name] directly at [phone].    [name] was agreeable to this plan.             FYI Only or Action Required?: Action required by provider: clinical question for provider, update on patient condition, and refusing ED disposition.  Patient was last seen in primary care on 11/18/2023 by [name] [name] [name], MD.  Called Nurse Triage reporting Fall.Pt fell this morning in the bathroom hitting her head above her eyebrow.   Has a knot there.   Is on Eliquis  for pulmonary embolus and clots in her legs.   Pt does not remember the fall.  Symptoms began today.  Interventions attempted: Nothing.  Symptoms are: stable.  Triage Disposition: Go to ED Now (Notify PCP)  Patient/caregiver understands and will follow disposition?: No, wishes to speak with PCP

## 2024-03-16 ENCOUNTER — Ambulatory Visit: Admitting: Family Medicine

## 2024-03-16 ENCOUNTER — Encounter: Payer: Self-pay | Admitting: Family Medicine

## 2024-03-16 ENCOUNTER — Ambulatory Visit: Payer: Self-pay | Admitting: Family Medicine

## 2024-03-16 VITALS — BP 118/80 | HR 98 | Temp 98.4°F | Ht 63.0 in | Wt 180.0 lb

## 2024-03-16 DIAGNOSIS — N39498 Other specified urinary incontinence: Secondary | ICD-10-CM | POA: Diagnosis not present

## 2024-03-16 DIAGNOSIS — E1165 Type 2 diabetes mellitus with hyperglycemia: Secondary | ICD-10-CM | POA: Diagnosis not present

## 2024-03-16 DIAGNOSIS — M5459 Other low back pain: Secondary | ICD-10-CM | POA: Diagnosis not present

## 2024-03-16 DIAGNOSIS — I2602 Saddle embolus of pulmonary artery with acute cor pulmonale: Secondary | ICD-10-CM

## 2024-03-16 DIAGNOSIS — I10 Essential (primary) hypertension: Secondary | ICD-10-CM

## 2024-03-16 DIAGNOSIS — R55 Syncope and collapse: Secondary | ICD-10-CM

## 2024-03-16 DIAGNOSIS — R531 Weakness: Secondary | ICD-10-CM

## 2024-03-16 DIAGNOSIS — N1831 Chronic kidney disease, stage 3a: Secondary | ICD-10-CM | POA: Diagnosis not present

## 2024-03-16 DIAGNOSIS — R296 Repeated falls: Secondary | ICD-10-CM | POA: Diagnosis not present

## 2024-03-16 LAB — POC URINALSYSI DIPSTICK (AUTOMATED)
Bilirubin, UA: NEGATIVE
Blood, UA: NEGATIVE
Glucose, UA: NEGATIVE
Ketones, UA: NEGATIVE
Leukocytes, UA: NEGATIVE
Nitrite, UA: NEGATIVE
Protein, UA: NEGATIVE
Spec Grav, UA: 1.01 (ref 1.010–1.025)
Urobilinogen, UA: 0.2 U/dL
pH, UA: 6 (ref 5.0–8.0)

## 2024-03-16 LAB — MICROALBUMIN / CREATININE URINE RATIO
Creatinine,U: 19 mg/dL
Microalb Creat Ratio: 249.1 mg/g — ABNORMAL HIGH (ref 0.0–30.0)
Microalb, Ur: 4.7 mg/dL — ABNORMAL HIGH (ref 0.0–1.9)

## 2024-03-16 MED ORDER — ATENOLOL 25 MG PO TABS: 25.0000 mg | ORAL_TABLET | Freq: Every day | ORAL | 3 refills | Status: AC

## 2024-03-16 NOTE — Patient Instructions (Addendum)
 [facility] IR Imaging Follow up.   Specialty: Radiology Why: Please follow up with Dr. Jennefer in approximately one month for CT and US  imaging and clinic visit. A scheduler from our office will call you with a date/time of your appointment Contact information: [address] [place]  [address] [phone]

## 2024-03-16 NOTE — Telephone Encounter (Signed)
 I heard about this case earlier on 03/15/2024.  I think the appropriate dispo was ER, but the patient refused.

## 2024-03-16 NOTE — Progress Notes (Signed)
 Patient ID: [name] [name], female    DOB: 1945/10/18, 78 y.o.   MRN: [id number]  This visit was conducted in person.  BP 118/80   Pulse 98   Temp 98.4 F (36.9 C) (Temporal)   Ht 5' 3 (1.6 m)   Wt 180 lb (81.6 kg)   SpO2 94%   BMI 31.89 kg/m    CC:  Chief Complaint  Patient presents with   Hospitalization Follow-up   Fall    Subjective:   HPI: [name] [name] is a 78 y.o. 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 admitted. Treated with hepatrin, followed by Eliquis .  Recent hospitalization on March 02, 2024 for pulmonary embolism and acute DVT Discharged March 08, 2024 Outpatient follow-up recommended: CMP and CBC at follow-up  She presented with syncopal episode resulting in head trauma and found to have submassive pulmonary embolus.  Admitted to ICU under critical care. IR consulted and underwent pulmonary artery thrombectomy and IVC filter placement on August 13. She was transition to starter pack Eliquis ... Discharged on Eliquis  10 mg twice daily for 3 additional days followed by 5 mg twice daily.  While in hospital she was also treated for Klebsiella UTI with IV ceftriaxone  for 2 days.  Discharged on cefadroxil  for 3 additional days.  History of orthostatic hypotension with falls at discharge she was able to ambulate 180 feet with walker without distress   She presents for follow-up today.  Daughter states she has fallen 2 additional times.  Unwitnessed.  LOC though several times.  No daily headaches.  Having low back pain... not able to do nerve ablation for months.  Using hydrocodone /promethazine , lyrica ... she is sleepy with these.  Allergic to lidocaine .   No  dysuria, no frequency no urgency now. Does have incontinence.  Mental status back in nml range.       Relevant past medical, surgical, family and social history reviewed and updated as indicated. Interim medical history since our last visit reviewed. Allergies and medications reviewed and updated. Outpatient Medications Prior to Visit  Medication Sig Dispense Refill   albuterol  (PROAIR  HFA) 108 (90 Base) MCG/ACT inhaler Inhale 2 puffs into the lungs every 6 (six) hours as needed for wheezing or shortness of breath. 8 g 2   cetirizine (ZYRTEC) 10 MG tablet Take 10 mg by mouth daily. (Patient taking differently: Take 10 mg by mouth as needed.)     Coenzyme Q10 (COQ10 PO) Take 1 capsule by mouth daily.     CRANBERRY PO Take 1 capsule by mouth daily.     D-MANNOSE PO Take 2 tablets by mouth daily.     DULoxetine  (CYMBALTA ) 60 MG capsule Take 1 capsule (60 mg total) by mouth daily. 90 capsule 3   furosemide  (LASIX ) 40 MG tablet Take 40 mg by mouth daily.     HYDROcodone -acetaminophen  (NORCO/VICODIN) 5-325 MG tablet Take 1 tablet by mouth 2 (two) times daily as needed for severe pain (pain score 7-10).     insulin  lispro (HUMALOG) 100 UNIT/ML injection Inject 100 Units into the skin See admin instructions. Max daily dose 100 units, administered continuously via pump.     pantoprazole  (PROTONIX ) 40 MG tablet TAKE 1 TABLET BY MOUTH EVERY DAY 30 tablet 0  predniSONE  (DELTASONE ) 10 MG tablet Take 10 mg by mouth daily with breakfast.     pregabalin  (LYRICA ) 100 MG capsule Take 100 mg by mouth 2 (two) times daily as needed (neuropathy).     promethazine  (PHENERGAN ) 12.5 MG tablet Take 12.5 mg by mouth 2 (two) times daily as needed for nausea. (Patient not taking: Reported on 03/25/2024)     senna-docusate (SENOKOT-S) 8.6-50 MG tablet Take 1-2 tablets by mouth 2 (two) times daily between meals as needed for mild constipation or moderate constipation. (Patient not taking: Reported on 03/25/2024)     apixaban   (ELIQUIS ) 5 MG TABS tablet Take 2 tablets (10 mg total) by mouth 2 (two) times daily for 3 days, THEN 1 tablet (5 mg total) 2 (two) times daily.     atenolol  (TENORMIN ) 25 MG tablet TAKE 1 TABLET (25 MG TOTAL) BY MOUTH DAILY. 90 tablet 3   hydrOXYzine  (ATARAX ) 25 MG tablet Take 25 mg by mouth 2 (two) times daily as needed for anxiety.     nitrofurantoin , macrocrystal-monohydrate, (MACROBID ) 100 MG capsule Take 100 mg by mouth daily.     traMADol  (ULTRAM ) 50 MG tablet Take 1 tablet (50 mg total) by mouth every 8 (eight) hours as needed for severe pain (pain score 7-10). 20 tablet 0   No facility-administered medications prior to visit.     Per HPI unless specifically indicated in ROS section below Review of Systems  Constitutional:  Positive for fatigue. Negative for fever.  HENT:  Negative for congestion.   Eyes:  Negative for pain.  Respiratory:  Positive for shortness of breath. Negative for cough.   Cardiovascular:  Negative for chest pain, palpitations and leg swelling.  Gastrointestinal:  Negative for abdominal pain.  Genitourinary:  Negative for dysuria and vaginal bleeding.  Musculoskeletal:  Positive for back pain.  Neurological:  Positive for weakness. Negative for syncope, light-headedness and headaches.  Psychiatric/Behavioral:  Positive for dysphoric mood and sleep disturbance.    Objective:  BP 118/80   Pulse 98   Temp 98.4 F (36.9 C) (Temporal)   Ht 5' 3 (1.6 m)   Wt 180 lb (81.6 kg)   SpO2 94%   BMI 31.89 kg/m   Wt Readings from Last 3 Encounters:  03/25/24 179 lb 12.8 oz (81.6 kg)  03/16/24 180 lb (81.6 kg)  03/06/24 184 lb 11.9 oz (83.8 kg)      Physical Exam Constitutional:      General: She is not in acute distress.    Appearance: Normal appearance. She is well-developed. She is not ill-appearing or toxic-appearing.  HENT:     Head: Normocephalic.     Right Ear: Hearing, tympanic membrane, ear canal and external ear normal. Tympanic membrane is not  erythematous, retracted or bulging.     Left Ear: Hearing, tympanic membrane, ear canal and external ear normal. Tympanic membrane is not erythematous, retracted or bulging.     Nose: No mucosal edema or rhinorrhea.     Right Sinus: No maxillary sinus tenderness or frontal sinus tenderness.     Left Sinus: No maxillary sinus tenderness or frontal sinus tenderness.     Mouth/Throat:     Pharynx: Uvula midline.  Eyes:     General: Lids are normal. Lids are everted, no foreign bodies appreciated.     Conjunctiva/sclera: Conjunctivae normal.     Pupils: Pupils are equal, round, and reactive to light.  Neck:     Thyroid : No thyroid  mass or thyromegaly.  Vascular: No carotid bruit.     Trachea: Trachea normal.  Cardiovascular:     Rate and Rhythm: Normal rate and regular rhythm.     Pulses: Normal pulses.     Heart sounds: Normal heart sounds, S1 normal and S2 normal. No murmur heard.    No friction rub. No gallop.  Pulmonary:     Effort: Pulmonary effort is normal. No tachypnea or respiratory distress.     Breath sounds: Normal breath sounds. No decreased breath sounds, wheezing, rhonchi or rales.  Abdominal:     General: Bowel sounds are normal.     Palpations: Abdomen is soft.     Tenderness: There is no abdominal tenderness.  Musculoskeletal:     Cervical back: Normal range of motion and neck supple.  Skin:    General: Skin is warm and dry.     Findings: No rash.  Neurological:     Mental Status: She is alert.  Psychiatric:        Mood and Affect: Mood is not anxious or depressed.        Speech: Speech normal.        Behavior: Behavior normal. Behavior is cooperative.        Thought Content: Thought content normal.        Judgment: Judgment normal.       Results for orders placed or performed in visit on 03/16/24  Microalbumin / creatinine urine ratio   Collection Time: 03/16/24  1:15 PM  Result Value Ref Range   Microalb, Ur 4.7 (H) 0.0 - 1.9 mg/dL   Creatinine,U  80.9 mg/dL   Microalb Creat Ratio 249.1 (H) 0.0 - 30.0 mg/g  CBC with Differential/Platelet   Collection Time: 03/16/24  1:15 PM  Result Value Ref Range   WBC 9.4 4.0 - 10.5 K/uL   RBC 4.32 3.87 - 5.11 Mil/uL   Hemoglobin 13.1 12.0 - 15.0 g/dL   HCT 60.1 63.9 - 53.9 %   MCV 92.2 78.0 - 100.0 fl   MCHC 32.9 30.0 - 36.0 g/dL   RDW 84.5 88.4 - 84.4 %   Platelets 171.0 150.0 - 400.0 K/uL   Neutrophils Relative % 65.5 43.0 - 77.0 %   Lymphocytes Relative 25.0 12.0 - 46.0 %   Monocytes Relative 7.2 3.0 - 12.0 %   Eosinophils Relative 1.5 0.0 - 5.0 %   Basophils Relative 0.8 0.0 - 3.0 %   Neutro Abs 6.2 1.4 - 7.7 K/uL   Lymphs Abs 2.4 0.7 - 4.0 K/uL   Monocytes Absolute 0.7 0.1 - 1.0 K/uL   Eosinophils Absolute 0.1 0.0 - 0.7 K/uL   Basophils Absolute 0.1 0.0 - 0.1 K/uL  Comprehensive metabolic panel with GFR   Collection Time: 03/16/24  1:15 PM  Result Value Ref Range   Sodium 139 135 - 145 mEq/L   Potassium 3.8 3.5 - 5.1 mEq/L   Chloride 97 96 - 112 mEq/L   CO2 30 19 - 32 mEq/L   Glucose, Bld 129 (H) 70 - 99 mg/dL   BUN 28 (H) 6 - 23 mg/dL   Creatinine, Ser 8.74 (H) 0.40 - 1.20 mg/dL   Total Bilirubin 1.0 0.2 - 1.2 mg/dL   Alkaline Phosphatase 244 (H) 39 - 117 U/L   AST 35 0 - 37 U/L   ALT 22 0 - 35 U/L   Total Protein 6.8 6.0 - 8.3 g/dL   Albumin 3.8 3.5 - 5.2 g/dL   GFR 58.58 (L) >39.99 mL/min  Calcium 9.4 8.4 - 10.5 mg/dL  POCT Urinalysis Dipstick (Automated)   Collection Time: 03/16/24  1:21 PM  Result Value Ref Range   Color, UA yellow    Clarity, UA clear    Glucose, UA Negative Negative   Bilirubin, UA negative    Ketones, UA negative    Spec Grav, UA 1.010 1.010 - 1.025   Blood, UA negative    pH, UA 6.0 5.0 - 8.0   Protein, UA Negative Negative   Urobilinogen, UA 0.2 0.2 or 1.0 E.U./dL   Nitrite, UA negative    Leukocytes, UA Negative Negative    Assessment and Plan  Poorly controlled type 2 diabetes mellitus (HCC) Assessment & Plan: Chronic,  inadequate control followed by endocrinology  Orders: -     Microalbumin / creatinine urine ratio  Primary hypertension -     Atenolol ; Take 1 tablet (25 mg total) by mouth daily.  Dispense: 90 tablet; Refill: 3  CKD stage 3a, GFR 45-59 ml/min (HCC) Assessment & Plan: Acute, due for re-eval.  Orders: -     CBC with Differential/Platelet -     Comprehensive metabolic panel with GFR  Other urinary incontinence -     POCT Urinalysis Dipstick (Automated)  Frequent falls  Acute saddle pulmonary embolism with acute cor pulmonale (HCC) Assessment & Plan:  Acute IR consulted and underwent pulmonary artery thrombectomy and IVC filter placement on August 13. She was transition to starter pack Eliquis ... Discharged on Eliquis  10 mg twice daily for 3 additional days followed by 5 mg twice daily.   Weakness generalized Assessment & Plan: High risk for falls.   Near syncope Assessment & Plan: History of orthostatic hypotension.  Encourage patient follow-up with cardiology for consideration of adjustment of medication.   Intractable low back pain Assessment & Plan: Chronic, unfortunately she will not be able to do her nerve ablation now that she is on Eliquis  for several months. She was given the contact number to follow-up with interventional radiology. She is using hydrocodone /promethazine  and Lyrica  but these caused her to feel sleepy and I am concerned they could increase her risk for falls. She has an allergy to lidocaine  and lidocaine  patches.     No follow-ups on file.   Greig Ring, MD

## 2024-03-16 NOTE — Telephone Encounter (Signed)
 Patient seen in office today.

## 2024-03-17 LAB — CBC WITH DIFFERENTIAL/PLATELET
Basophils Absolute: 0.1 10*3/uL (ref 0.0–0.1)
Basophils Relative: 0.8 % (ref 0.0–3.0)
Eosinophils Absolute: 0.1 10*3/uL (ref 0.0–0.7)
Eosinophils Relative: 1.5 % (ref 0.0–5.0)
HCT: 39.8 % (ref 36.0–46.0)
Hemoglobin: 13.1 g/dL (ref 12.0–15.0)
Lymphocytes Relative: 25 % (ref 12.0–46.0)
Lymphs Abs: 2.4 10*3/uL (ref 0.7–4.0)
MCHC: 32.9 g/dL (ref 30.0–36.0)
MCV: 92.2 fl (ref 78.0–100.0)
Monocytes Absolute: 0.7 10*3/uL (ref 0.1–1.0)
Monocytes Relative: 7.2 % (ref 3.0–12.0)
Neutro Abs: 6.2 10*3/uL (ref 1.4–7.7)
Neutrophils Relative %: 65.5 % (ref 43.0–77.0)
Platelets: 171 10*3/uL (ref 150.0–400.0)
RBC: 4.32 Mil/uL (ref 3.87–5.11)
RDW: 15.4 % (ref 11.5–15.5)
WBC: 9.4 10*3/uL (ref 4.0–10.5)

## 2024-03-17 LAB — COMPREHENSIVE METABOLIC PANEL WITH GFR
ALT: 22 U/L (ref 0–35)
AST: 35 U/L (ref 0–37)
Albumin: 3.8 g/dL (ref 3.5–5.2)
Alkaline Phosphatase: 244 U/L — ABNORMAL HIGH (ref 39–117)
BUN: 28 mg/dL — ABNORMAL HIGH (ref 6–23)
CO2: 30 meq/L (ref 19–32)
Calcium: 9.4 mg/dL (ref 8.4–10.5)
Chloride: 97 meq/L (ref 96–112)
Creatinine, Ser: 1.25 mg/dL — ABNORMAL HIGH (ref 0.40–1.20)
GFR: 41.41 mL/min — ABNORMAL LOW
Glucose, Bld: 129 mg/dL — ABNORMAL HIGH (ref 70–99)
Potassium: 3.8 meq/L (ref 3.5–5.1)
Sodium: 139 meq/L (ref 135–145)
Total Bilirubin: 1 mg/dL (ref 0.2–1.2)
Total Protein: 6.8 g/dL (ref 6.0–8.3)

## 2024-03-23 ENCOUNTER — Encounter (HOSPITAL_BASED_OUTPATIENT_CLINIC_OR_DEPARTMENT_OTHER): Payer: Self-pay

## 2024-03-25 ENCOUNTER — Ambulatory Visit: Attending: Physician Assistant | Admitting: Physician Assistant

## 2024-03-25 ENCOUNTER — Ambulatory Visit: Attending: Physician Assistant

## 2024-03-25 ENCOUNTER — Encounter: Payer: Self-pay | Admitting: Physician Assistant

## 2024-03-25 VITALS — BP 114/63 | HR 65 | Ht 63.0 in | Wt 179.8 lb

## 2024-03-25 DIAGNOSIS — N2889 Other specified disorders of kidney and ureter: Secondary | ICD-10-CM

## 2024-03-25 DIAGNOSIS — I5032 Chronic diastolic (congestive) heart failure: Secondary | ICD-10-CM | POA: Diagnosis not present

## 2024-03-25 DIAGNOSIS — I2602 Saddle embolus of pulmonary artery with acute cor pulmonale: Secondary | ICD-10-CM

## 2024-03-25 DIAGNOSIS — R55 Syncope and collapse: Secondary | ICD-10-CM

## 2024-03-25 NOTE — Progress Notes (Signed)
 Cardiology Office Note   Date:  03/27/2024  ID:  [name] [name], DOB Aug 13, 1945, MRN [id number] PCP: Avelina Greig [name], MD  [facility] Providers Cardiologist:  Dorn Lesches, MD     History of Present Illness [name] [name] is a 78 y.o. female with a hx of obesity, chronic diastolic dysfunction, palpitation, hyperlipidemia, DM2, renal CA, orthostatic hypotension, autoimmune hepatitis/liver cirrhosis, pulmonary embolism obstructive sleep apnea not on CPAP.  Previous event monitor obtained in June 2013 showed PACs and PVCs.  She also has chronic transaminitis elevation.  Myoview  in 12/2018 was low risk without evidence of infarction or ischemia. Patient was seen in the ED in October 2021 due to generalized weakness.  She was noted to be bradycardic in the 40s.  She was also found to have a urinary tract infection and was given Keflex .  Patient was instructed to stop her atenolol .  On follow-up, patient did not remember the ED visit and was still taking atenolol .  I had last saw the patient in November 2023 as part of preop clearance.  Subsequent echocardiogram obtained on 07/16/2022 showed EF 60 to 65%, grade 1 DD, moderate mitral annular calcification with trivial MR. She was diagnosed with renal cancer and underwent left nephrectomy and appendectomy on 08/14/2022.  Her CT image prior to the procedure also showed a 10 mm left lung nodule.  She has been admitted in the hospital in March 2025 with syncope.  She did have COVID and UTI at the time.  She was also noted to be in A-fib briefly during the hospitalization.  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 Loni.  Given her frequent falls, was deemed high risk to put her on anticoagulation therapy.  She was seen by Dr. Lesches in April 2025 at which time she was wearing a heart monitor.  Heart monitor showed occasional PVCs, sinus rhythm, low A-fib burden of only 1%.  She was not started on anticoagulation  therapy.  Unfortunately she was admitted to the hospital 02/26/2024 after outpatient CT image showed PE in the right main, subsegmental right upper and middle lobe associated with right heart strain.  There was no evidence of pulmonary infarction.  Echocardiogram obtained on 02/27/2024 showed EF 65 to 70%, no regional wall motion abnormality, RVSP 53.4 mmHg, mild MR.  Venous Doppler obtained on 02/27/2024 confirmed acute DVT involving the midportion of the right posterior tibial vein, right peroneal vein, partially occlusive acute DVT involving the left common femoral vein, SFJ junction and left popliteal vein.  She was discharged on DVT dosing of Eliquis .  Patient returned to the hospital 4 days later with syncope, fall and head trauma while on Eliquis .  CT PE protocol showed treatment saddle embolus has migrated distally into the right main pulmonary artery with occlusion of the right pulmonary branch.  Patient was admitted by pulmonary critical care service to the ICU.  Interventional radiology service was consulted and the patient underwent IVC filter placement and pulmonary artery thrombectomy by Dr. Jennefer on 03/03/2024.  Brain MRI obtained during the hospitalization showing no evidence of acute infarct.  Patient did receive IV antibiotic for Klebsiella pneumonia UTI.  Patient presents today accompanied by daughter-in-law.  Since discharge, she has passed that about 4-5 times.  She has been compliant with twice a daydosing of Eliquis .  Given lack of acute finding on the recent brain MRI, and the lack of mobile density on the recent echocardiogram, I do not think the previous  mobile echodensity that was seen in March 2025 was related to her passing out spell.  When she was wearing her monitor in 2023-11-24, she never had passed.  I recommended a 30-day live monitor to rule out arrhythmia.  Certainly, PE may play a role as well, primary concern is for passing out spell may be related to significant orthostatic  hypotension.  I am hoping that with the continuous Eliquis  usage, her passing out spell more began to decrease in frequency due to less clot burden.  I plan to see the patient back in 6 to 8 weeks.  If she continued to have frequent passout, may need to consider midodrine and Florinef.  She is on atenolol  primarily for history of Hollie cirrhosis   ROS:   Patient continued to have intermittent syncope.  She denies any chest pain or shortness of breath  Studies Reviewed      Cardiac Studies & Procedures   ______________________________________________________________________________________________   STRESS TESTS  NM MYOCAR MULTI W/SPECT W 12/22/2018  Narrative CLINICAL DATA:  Chest pain  EXAM: MYOCARDIAL IMAGING WITH SPECT (REST AND PHARMACOLOGIC-STRESS)  GATED LEFT VENTRICULAR WALL MOTION STUDY  LEFT VENTRICULAR EJECTION FRACTION  TECHNIQUE: Standard myocardial SPECT imaging was performed after resting intravenous injection of 10 mCi Tc-35m tetrofosmin . Subsequently, intravenous infusion of Lexiscan  was performed under the supervision of the Cardiology staff. At peak effect of the drug, 30 mCi Tc-59m tetrofosmin  was injected intravenously and standard myocardial SPECT imaging was performed. Quantitative gated imaging was also performed to evaluate left ventricular wall motion, and estimate left ventricular ejection fraction.  COMPARISON:  None.  FINDINGS: Perfusion: Decreased activity is noted inferiorly on the rest images, improving on stress images most compatible with soft tissue attenuation. No reversible defects to suggest ischemia.  Wall Motion: Normal left ventricular wall motion. No left ventricular dilation.  Left Ventricular Ejection Fraction: 82 %  End diastolic volume 67 ml  End systolic volume 12 ml  IMPRESSION: 1. No reversible ischemia or infarction.  2. Normal left ventricular wall motion.  3. Left ventricular ejection fraction 82%  4. Non  invasive risk stratification*: Low  *2012 Appropriate Use Criteria for Coronary Revascularization Focused Update: J Am Coll Cardiol. 2012;59(9):857-881. http://content.dementiazones.com.aspx?articleid=1201161   Electronically Signed By: Franky Crease M.D. On: 12/22/2018 15:58   ECHOCARDIOGRAM  ECHOCARDIOGRAM LIMITED 03/04/2024  Narrative ECHOCARDIOGRAM LIMITED REPORT    Patient Name:   [name] [name] Date of Exam: 03/04/2024 Medical Rec #:  [id number]     Height:       63.0 in Accession #:    [id number]    Weight:       198.0 lb Date of Birth:  [birth year]      BSA:          1.925 m Patient Age:    78 years      BP:           106/73 mmHg Patient Gender: F             HR:           77 bpm. Exam Location:  Inpatient  Procedure: Cardiac Doppler and Limited Color Doppler (Both Spectral and Color Flow Doppler were utilized during procedure).  Indications:     Pulmonary embolus  History:         Patient has prior history of Echocardiogram examinations, most recent 02/27/2024. Pulmonary embolus.  Sonographer:     Benard Stallion Referring Phys:  [id number] [facility] PATEL Diagnosing Phys: Darryle Decent MD  IMPRESSIONS   1. Left ventricular ejection fraction, by estimation, is 65 to 70%. The left ventricle has normal function. The left ventricle has no regional wall motion abnormalities. Left ventricular diastolic parameters are consistent with Grade I diastolic dysfunction (impaired relaxation). 2. Right ventricular systolic function is normal. The right ventricular size is normal. There is normal pulmonary artery systolic pressure. The estimated right ventricular systolic pressure is 9.8 mmHg. 3. The mitral valve is grossly normal. No evidence of mitral valve regurgitation. No evidence of mitral stenosis. 4. The aortic valve is tricuspid. Aortic valve regurgitation is not visualized. Aortic valve sclerosis is present, with no evidence of aortic valve stenosis. 5. The inferior vena  cava is normal in size with greater than 50% respiratory variability, suggesting right atrial pressure of 3 mmHg.  FINDINGS Left Ventricle: Left ventricular ejection fraction, by estimation, is 65 to 70%. The left ventricle has normal function. The left ventricle has no regional wall motion abnormalities. The left ventricular internal cavity size was normal in size. There is no left ventricular hypertrophy. Left ventricular diastolic parameters are consistent with Grade I diastolic dysfunction (impaired relaxation).  Right Ventricle: The right ventricular size is normal. No increase in right ventricular wall thickness. Right ventricular systolic function is normal. There is normal pulmonary artery systolic pressure. The tricuspid regurgitant velocity is 1.30 m/s, and with an assumed right atrial pressure of 3 mmHg, the estimated right ventricular systolic pressure is 9.8 mmHg.  Left Atrium: Left atrial size was normal in size.  Right Atrium: Right atrial size was normal in size.  Pericardium: There is no evidence of pericardial effusion.  Mitral Valve: The mitral valve is grossly normal. No evidence of mitral valve stenosis.  Tricuspid Valve: The tricuspid valve is grossly normal. Tricuspid valve regurgitation is trivial. No evidence of tricuspid stenosis.  Aortic Valve: The aortic valve is tricuspid. Aortic valve regurgitation is not visualized. Aortic valve sclerosis is present, with no evidence of aortic valve stenosis. Aortic valve mean gradient measures 4.0 mmHg. Aortic valve peak gradient measures 7.6 mmHg. Aortic valve area, by VTI measures 3.35 cm.  Aorta: The aortic root is normal in size and structure.  Venous: The inferior vena cava is normal in size with greater than 50% respiratory variability, suggesting right atrial pressure of 3 mmHg.  IAS/Shunts: The atrial septum is grossly normal.  LEFT VENTRICLE PLAX 2D LVIDd:         3.60 cm   Diastology LVIDs:         2.30 cm    LV e' medial:    5.98 cm/s LV PW:         1.00 cm   LV E/e' medial:  15.4 LV IVS:        1.00 cm   LV e' lateral:   7.40 cm/s LVOT diam:     2.10 cm   LV E/e' lateral: 12.4 LV SV:         99 LV SV Index:   51 LVOT Area:     3.46 cm   RIGHT VENTRICLE RV Basal diam:  3.40 cm RV Mid diam:    2.90 cm RV S prime:     13.20 cm/s TAPSE (M-mode): 2.0 cm  LEFT ATRIUM           Index        RIGHT ATRIUM          Index LA diam:      2.60 cm 1.35 cm/m   RA Area:  9.04 cm LA Vol (A2C): 22.2 ml 11.53 ml/m  RA Volume:   14.80 ml 7.69 ml/m LA Vol (A4C): 24.8 ml 12.88 ml/m AORTIC VALVE AV Area (Vmax):    3.20 cm AV Area (Vmean):   3.15 cm AV Area (VTI):     3.35 cm AV Vmax:           137.50 cm/s AV Vmean:          94.350 cm/s AV VTI:            0.296 m AV Peak Grad:      7.6 mmHg AV Mean Grad:      4.0 mmHg LVOT Vmax:         127.00 cm/s LVOT Vmean:        85.900 cm/s LVOT VTI:          0.286 m LVOT/AV VTI ratio: 0.97  AORTA Ao Root diam: 3.20 cm  MITRAL VALVE                TRICUSPID VALVE MV Area (PHT): 2.76 cm     TR Peak grad:   6.8 mmHg MV Decel Time: 275 msec     TR Vmax:        130.00 cm/s MV E velocity: 92.00 cm/s MV A velocity: 126.00 cm/s  SHUNTS MV E/A ratio:  0.73         Systemic VTI:  0.29 m Systemic Diam: 2.10 cm  Darryle Decent MD Electronically signed by Darryle Decent MD Signature Date/Time: 03/04/2024/2:08:59 PM    Final (Updated)    MONITORS  CARDIAC EVENT MONITOR 11/04/2023  Narrative SR/SB/ST () Occasional PVCs ROV with me or an APP to discuss       ______________________________________________________________________________________________      Risk Assessment/Calculations          Physical Exam VS:  BP 114/63 (BP Location: Left Arm, Patient Position: Sitting, Cuff Size: Large)   Pulse 65   Ht 5' 3 (1.6 m)   Wt 179 lb 12.8 oz (81.6 kg)   SpO2 96%   BMI 31.85 kg/m        Wt Readings from Last 3 Encounters:  03/25/24  179 lb 12.8 oz (81.6 kg)  03/16/24 180 lb (81.6 kg)  03/06/24 184 lb 11.9 oz (83.8 kg)    GEN: Well nourished, well developed in no acute distress NECK: No JVD; No carotid bruits CARDIAC: RRR, no murmurs, rubs, gallops RESPIRATORY:  Clear to auscultation without rales, wheezing or rhonchi  ABDOMEN: Soft, non-tender, non-distended EXTREMITIES:  No edema; No deformity   ASSESSMENT AND PLAN  Syncope: Patient has longstanding history of syncope, however symptom has worsened lately.  She does have a saddle PE which may contribute to the syncope, she is currently receiving Eliquis .  I will also obtain a 30-day heart monitor.  She previously wore a 2-week heart monitor earlier this year, however she did not have a syncope on the heart monitor.  My concern is her syncope may be related to significant orthostatic hypotension.  I am hoping that was decreased clot burden on Eliquis , she will have less syncope, however if symptoms persist, may need to consider addition of midodrine  Recent saddle PE: She was diagnosed with PE and DVT in early August.  Interventional radiology service was consulted and the patient underwent IVC filter placement and pulmonary artery thrombectomy on 03/03/2024.  Continue Eliquis   History of chronic diastolic heart failure: Euvolemic on exam.  She really has not had  any significant heart failure admission, therefore if orthostatic hypotension become an issue in the future, I think she may still be a candidate for Florinef  History of liver mass status post left nephrectomy: Followed by hematology oncology service.  History of liver cirrhosis: This is one of the main reason she was not placed on new medications for orthostatic hypotension.  If her symptoms persist, we will discuss with our clinical pharmacist to see if she may be a candidate for midodrine plus Florinef.  She is on low-dose atenolol        Dispo: Follow-up with Dr. Court or me in a 6-8 weeks  Signed, Scot Ford, PA

## 2024-03-25 NOTE — Patient Instructions (Addendum)
 Medication Instructions:  Your physician recommends that you continue on your current medications as directed. Please refer to the Current Medication list given to you today.  *If you need a refill on your cardiac medications before your next appointment, please call your pharmacy*  Lab Work: None ordered If you have labs (blood work) drawn today and your tests are completely normal, you will receive your results only by: MyChart Message (if you have MyChart) OR A paper copy in the mail If you have any lab test that is abnormal or we need to change your treatment, we will call you to review the results.  Testing/Procedures: Your physician has recommended that you wear a 30 day live event monitor. Event monitors are medical devices that record the heart's electrical activity. Doctors most often us  these monitors to diagnose arrhythmias. Arrhythmias are problems with the speed or rhythm of the heartbeat. The monitor is a small, portable device. You can wear one while you do your normal daily activities. This is usually used to diagnose what is causing palpitations/syncope (passing out).   Follow-Up: At [facility], you and your health needs are our priority.  As part of our continuing mission to provide you with exceptional heart care, our providers are all part of one team.  This team includes your primary Cardiologist (physician) and Advanced Practice Providers or APPs (Physician Assistants and Nurse Practitioners) who all work together to provide you with the care you need, when you need it.  Your next appointment:   6-8 week(s) with orthostatics  Provider:   Hao Meng, PA-C, on a day that Dr Court is in the office  Other Instructions Increase your fluid intake as discussed.

## 2024-03-25 NOTE — Progress Notes (Unsigned)
 [employer] event monitor serial [id number] from office inventory applied to patient.  Dr. Court to read.

## 2024-04-02 DIAGNOSIS — E1065 Type 1 diabetes mellitus with hyperglycemia: Secondary | ICD-10-CM | POA: Diagnosis not present

## 2024-04-05 ENCOUNTER — Other Ambulatory Visit: Payer: Self-pay | Admitting: *Deleted

## 2024-04-05 ENCOUNTER — Other Ambulatory Visit: Payer: Self-pay | Admitting: Interventional Radiology

## 2024-04-05 ENCOUNTER — Telehealth (HOSPITAL_COMMUNITY): Payer: Self-pay | Admitting: Student

## 2024-04-05 DIAGNOSIS — I2699 Other pulmonary embolism without acute cor pulmonale: Secondary | ICD-10-CM

## 2024-04-05 MED ORDER — APIXABAN 5 MG PO TABS: 5.0000 mg | ORAL_TABLET | Freq: Two times a day (BID) | ORAL | 3 refills | Status: DC

## 2024-04-05 MED ORDER — PREDNISONE 50 MG PO TABS: ORAL_TABLET | ORAL | 0 refills | Status: AC

## 2024-04-05 NOTE — Telephone Encounter (Signed)
 Patient scheduled for an imaging study 04/08/24. Patient has a contrast allergy requiring pre-medication with prednisone .   50 mg prednisone  x 3 tablets e-prescribed to her pharmacy. I called the patient's cell phone and left a VM that she needs to take one table at 13 hours, 7 hours, and 1 hour prior to her imaging study. When she takes the last tablet she also needs to take 50 mg of benadryl . Patient asked to call the clinic back if she has any questions/concerns.  Priyanka Causey, AGACNP-BC 04/05/2024, 12:21 PM

## 2024-04-07 ENCOUNTER — Ambulatory Visit: Admitting: General Practice

## 2024-04-07 ENCOUNTER — Telehealth: Payer: Self-pay | Admitting: *Deleted

## 2024-04-07 NOTE — Telephone Encounter (Signed)
 I spoke with [name] [name] (DPR signed) and pt thinks has another kidney infection. [name] [name] does not know what pts symptoms are; pt has so many UTIs that [facility] said pt knows when she is having UTI.pt only has one kidney. [name] [name] scheduled appt with [name] [name] [facility] NP 04/07/24 3:20 PM. Sending note to [name] [name] [facility] NP and [facility] pool,

## 2024-04-07 NOTE — Telephone Encounter (Signed)
 Copied from CRM #[id number]. Topic: Clinical - Medical Advice >> Apr 07, 2024  9:47 AM Chiquita SQUIBB wrote: Reason for CRM: Patient's daughter Rico is calling in stating that her mother has possibly another bladder infection. They are asking if something can be called in for this, as she just got over another one. Please contact [name] back for an update at [phone].

## 2024-04-07 NOTE — Telephone Encounter (Signed)
 Looks like the appt with Carrol was cancelled.

## 2024-04-07 NOTE — Telephone Encounter (Signed)
 Noted. Let pt  daghter know I should have availability Thur or Fri

## 2024-04-07 NOTE — Telephone Encounter (Signed)
 Spoke with [employer]  She states her mom refuses to be seen.  She has an appointment with her urologist on Monday and states she will wait until then.  They have a call in to the urologist office to see if they will prescribe her an antibiotic until she is seen with them on Monday.  FYI to Dr. Avelina.

## 2024-04-08 ENCOUNTER — Inpatient Hospital Stay: Admission: RE | Admit: 2024-04-08 | Source: Ambulatory Visit

## 2024-04-08 ENCOUNTER — Other Ambulatory Visit

## 2024-04-09 ENCOUNTER — Other Ambulatory Visit

## 2024-04-09 ENCOUNTER — Ambulatory Visit
Admission: RE | Admit: 2024-04-09 | Discharge: 2024-04-09 | Disposition: A | Discharge status: Home / Self Care | Source: Ambulatory Visit | Attending: Interventional Radiology | Admitting: Interventional Radiology

## 2024-04-09 DIAGNOSIS — I2699 Other pulmonary embolism without acute cor pulmonale: Secondary | ICD-10-CM

## 2024-04-09 DIAGNOSIS — I82512 Chronic embolism and thrombosis of left femoral vein: Secondary | ICD-10-CM | POA: Diagnosis not present

## 2024-04-09 DIAGNOSIS — K5730 Diverticulosis of large intestine without perforation or abscess without bleeding: Secondary | ICD-10-CM | POA: Diagnosis not present

## 2024-04-09 MED ORDER — IOPAMIDOL (ISOVUE-370) INJECTION 76%
80.0000 mL | Freq: Once | INTRAVENOUS | Status: AC | PRN
  Administered 2024-04-09: 80 mL via INTRAVENOUS

## 2024-04-09 MED ORDER — IOPAMIDOL (ISOVUE-300) INJECTION 61%: 80.0000 mL | Freq: Once | INTRAVENOUS | Status: DC | PRN

## 2024-04-09 NOTE — Progress Notes (Shared)
 This encounter was conducted via the [employer] providing interactive audio and visual communication.  The patient provided verbal consent to conduct a virtual appointment.  The patient was located at their primary residence during this encounter.  Referring Physician(s): Dr. Paula Southerly   Chief Complaint: The patient is seen in virtual video follow up today s/p PE thrombectomy and IVC filter placement 03/03/24  History of present illness:  [name] [name], 78 year old female, has a medical history significant for renal cell carcinoma s/p nephrectomy, DM, CHF, orthostatic hypotension, autoimmune hepatitis/cirrhosis and pulmonary embolism on Eliquis .   She presented to the ED 03/02/24 with injuries related to a fall/syncopal event. Imaging obtained in the ED showed worsening PE with evidence of right heart strain. Lower extremity duplex study was positive for bilateral DVT. IR was consulted for PE thrombectomy and placement of an IVC filter. On 03/03/24 I performed both of these procedure with scant removal of thrombus from the right pulmonary artery. Pulmonary manometry showed a reduction in mean pulmonary artery pressure from 20 mm Hg to 12 mm Hg. An IVC filter was successfully placed.   The patient tolerated both of these procedures well and had an uneventful recovery in the hospital. She was discharged on Eliquis . She presents today to the Interventional Radiology outpatient clinic via virtual video visit for follow up. CT Venogram abdomen/pelvis and lower extremity duplex study performed 04/09/24.  She followed up with Cardiology 03-25-24 and reported 4-5 syncopal episodes. She was placed on a 30-day live monitor.   Past Medical History:  Diagnosis Date   Allergic rhinitis, cause unspecified    Allergy    Anxiety    Arthritis    hands, feet and back   Asthma    Benign neoplasm of colon    Blood transfusion without reported diagnosis    Bursitis    hips   Cataract    CHF  (congestive heart failure) (HCC)    Cirrhosis (HCC)    auto immune   Complication of anesthesia     i WAKE UP WITH TREMORS  after shoulder surgery tremors for 10 months after surgery surgery- 2009- surgical center in [place]   Cyst and pseudocyst of pancreas    Depressive disorder, not elsewhere classified    Diabetes mellitus without complication (HCC)    Diverticulitis    Diverticulosis of colon (without mention of hemorrhage)    Dyspnea    Dysrhythmia    arrhythmia   Emphysema of lung (HCC)    Esophageal reflux    Essential and other specified forms of tremor    History of GI bleed    History of kidney stones    Irritable bowel syndrome    Obesity, unspecified    Other specified cardiac dysrhythmias(427.89)    Periapical abscess without sinus    Polyneuropathy in diabetes(357.2)    PONV (postoperative nausea and vomiting)    Pure hypercholesterolemia    Renal cell carcinoma (HCC)    Sleep apnea    CPAP- non compliant   Type II or unspecified type diabetes mellitus with neurological manifestations, not stated as uncontrolled(250.60)     Past Surgical History:  Procedure Laterality Date   ABDOMINAL HYSTERECTOMY     APPENDECTOMY  07/2022   CALCANEAL OSTEOTOMY Right 07/27/2015   Procedure: RIGHT CALCANEAL OSTEOTOMY ;  Surgeon: Norleen Armor, MD;  Location: [facility];  Service: Orthopedics;  Laterality: Right;   CHOLECYSTECTOMY     15-20 years ago   COLONOSCOPY WITH PROPOFOL   N/A 08/17/2019   Procedure: COLONOSCOPY WITH PROPOFOL ;  Surgeon: Rollin Dover, MD;  Location: [facility] ENDOSCOPY;  Service: Endoscopy;  Laterality: N/A;   COLONOSCOPY WITH PROPOFOL  N/A 06/21/2022   Procedure: COLONOSCOPY WITH PROPOFOL ;  Surgeon: Rollin Dover, MD;  Location: [facility] ENDOSCOPY;  Service: Gastroenterology;  Laterality: N/A;   ESOPHAGOGASTRODUODENOSCOPY (EGD) WITH PROPOFOL  N/A 08/17/2019   Procedure: ESOPHAGOGASTRODUODENOSCOPY (EGD) WITH PROPOFOL ;  Surgeon: Rollin Dover, MD;   Location: [facility] ENDOSCOPY;  Service: Endoscopy;  Laterality: N/A;   GASTROCNEMIUS RECESSION Right 07/27/2015   Procedure: RIGHT GASTROC RECESSION;  Surgeon: Norleen Armor, MD;  Location: [facility];  Service: Orthopedics;  Laterality: Right;   HERNIA REPAIR     IR ANGIOGRAM PULMONARY BILATERAL SELECTIVE  03/03/2024   IR ANGIOGRAM SELECTIVE EACH ADDITIONAL VESSEL  03/03/2024   IR ANGIOGRAM SELECTIVE EACH ADDITIONAL VESSEL  03/03/2024   IR IVC FILTER PLMT / S&I /IMG GUID/MOD SED  03/03/2024   IR THROMBECT PRIM MECH ADD (INCLU) MOD SED  03/03/2024   IR THROMBECT PRIM MECH ADD (INCLU) MOD SED  03/03/2024   IR THROMBECT PRIM MECH INIT (INCLU) MOD SED  03/03/2024   IR US  GUIDE VASC ACCESS RIGHT  03/03/2024   IR US  GUIDE VASC ACCESS RIGHT  03/03/2024   KIDNEY STONE SURGERY  03/23/1979   removal   LITHOTRIPSY     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   PARTIAL HYSTERECTOMY  03/22/1989   POLYPECTOMY  08/17/2019   Procedure: POLYPECTOMY;  Surgeon: Rollin Dover, MD;  Location: [facility] ENDOSCOPY;  Service: Endoscopy;;   POLYPECTOMY  06/21/2022   Procedure: POLYPECTOMY;  Surgeon: Rollin Dover, MD;  Location: [facility] ENDOSCOPY;  Service: Gastroenterology;;   RADIOLOGY WITH ANESTHESIA N/A 03/06/2024   Procedure: MRI WITH ANESTHESIA;  Surgeon: Radiologist, Medication, MD;  Location: [facility] OR;  Service: Radiology;  Laterality: N/A;   ROBOT ASSISTED LAPAROSCOPIC NEPHRECTOMY Left 08/14/2022   Procedure: XI ROBOTIC ASSISTED LAPAROSCOPIC NEPHRECTOMY;  Surgeon: Devere Lonni Righter, MD;  Location: [facility] ORS;  Service: Urology;  Laterality: Left;   ROTATOR CUFF REPAIR  07/23/2003   XI ROBOTIC LAPAROSCOPIC ASSISTED APPENDECTOMY N/A 08/14/2022   Procedure: XI ROBOTIC LAPAROSCOPIC ASSISTED APPENDECTOMY;  Surgeon: Debby Hila, MD;  Location: [facility];  Service: General;  Laterality: N/A;    Allergies: Auvi-q  [epinephrine ], Codeine , E.e.s. [erythromycin], Eucalyptus oil, Glucophage  [metformin ], Iodinated contrast media, Iohexol ,  Lortab [hydrocodone -acetaminophen ], Menthol, Roxicodone  [oxycodone ], Tylenol  [acetaminophen ], Vibramycin  [doxycycline ], Xylocaine  [lidocaine ], and Tape  Medications: Prior to Admission medications   Medication Sig Start Date End Date Taking? Authorizing Provider  apixaban  (ELIQUIS ) 5 MG TABS tablet Take 1 tablet (5 mg total) by mouth 2 (two) times daily. 04/05/24   Federico Norleen ONEIDA MADISON, MD  albuterol  (PROAIR  HFA) [phone] Base) MCG/ACT inhaler Inhale 2 puffs into the lungs every 6 (six) hours as needed for wheezing or shortness of breath. 06/13/23   Bedsole, Amy E, MD  atenolol  (TENORMIN ) 25 MG tablet Take 1 tablet (25 mg total) by mouth daily. 03/16/24   Bedsole, Amy E, MD  cetirizine (ZYRTEC) 10 MG tablet Take 10 mg by mouth daily. Patient taking differently: Take 10 mg by mouth as needed.    [provider]  Coenzyme Q10 (COQ10 PO) Take 1 capsule by mouth daily.    [provider]  CRANBERRY PO Take 1 capsule by mouth daily.    [provider]  D-MANNOSE PO Take 2 tablets by mouth daily.    [provider]  DULoxetine  (CYMBALTA ) 60 MG capsule Take 1 capsule (60 mg total)  by mouth daily. 07/11/23   Bedsole, Amy E, MD  furosemide  (LASIX ) 40 MG tablet Take 40 mg by mouth daily.    [provider]  HYDROcodone -acetaminophen  (NORCO/VICODIN) 5-325 MG tablet Take 1 tablet by mouth 2 (two) times daily as needed for severe pain (pain score 7-10). 02/16/24   [provider]  insulin  lispro (HUMALOG) 100 UNIT/ML injection Inject 100 Units into the skin See admin instructions. Max daily dose 100 units, administered continuously via pump.    [provider]  Nitrofurantoin  Monohyd Macro (MACROBID  PO) Take 1 capsule by mouth daily at 6 (six) AM.    [provider]  pantoprazole  (PROTONIX ) 40 MG tablet TAKE 1 TABLET BY MOUTH EVERY DAY 09/17/19   Wendling, Mabel Mt, DO  predniSONE  (DELTASONE ) 10 MG tablet Take 10 mg by mouth daily with  breakfast.    [provider]  predniSONE  (DELTASONE ) 50 MG tablet Take one tablet 13 hours, 7 hours and 1 hour prior to your imaging study. 04/05/24   Bradley Bostelman R, NP  pregabalin  (LYRICA ) 100 MG capsule Take 100 mg by mouth 2 (two) times daily as needed (neuropathy).    [provider]  promethazine  (PHENERGAN ) 12.5 MG tablet Take 12.5 mg by mouth 2 (two) times daily as needed for nausea. Patient not taking: Reported on 03/25/2024 02/16/24   [provider]  senna-docusate (SENOKOT-S) 8.6-50 MG tablet Take 1-2 tablets by mouth 2 (two) times daily between meals as needed for mild constipation or moderate constipation. Patient not taking: Reported on 03/25/2024 03/08/24   Gonfa, Taye T, MD     Family History  Problem Relation Age of Onset   Clotting disorder Mother    Arthritis Mother    Obesity Mother    Varicose Veins Mother    Cirrhosis Father    Arthritis Father    Depression Father    Diabetes Other        2 aunts   Cancer Other        grandmother   Allergies Other        whole family   Asthma Other        whole family   Early death Maternal Grandmother    ADD / ADHD Daughter    Anxiety disorder Daughter    Arthritis Daughter    Depression Daughter    Diabetes Daughter    Hypertension Daughter    Obesity Daughter    Arthritis Daughter    Obesity Daughter    Varicose Veins Daughter    Arthritis Son    Asthma Son    Depression Sister    Hypertension Son     Social History   Socioeconomic History   Marital status: Widowed    Spouse name: Not on file   Number of children: 5   Years of education: Not on file   Highest education level: Patient refused  Occupational History    Employer: RETIRED  Tobacco Use   Smoking status: Never   Smokeless tobacco: Never  Vaping Use   Vaping status: Never Used  Substance and Sexual Activity   Alcohol use: No    Alcohol/week: 0.0 standard drinks of alcohol   Drug use: No   Sexual activity:  Not on file  Other Topics Concern   Not on file  Social History Narrative   Husband treated for testicular cancer, 2 cups coffee a day, no exercise   Social Drivers of Health   Financial Resource Strain: Low Risk  (01/14/2022)  Overall Financial Resource Strain (CARDIA)    Difficulty of Paying Living Expenses: Not very hard  Food Insecurity: No Food Insecurity (03/03/2024)   Hunger Vital Sign    Worried About Running Out of Food in the Last Year: Never true    Ran Out of Food in the Last Year: Never true  Transportation Needs: No Transportation Needs (03/03/2024)   PRAPARE - Administrator, Civil Service (Medical): No    Lack of Transportation (Non-Medical): No  Physical Activity: Inactive (01/14/2022)   Exercise Vital Sign    Days of Exercise per Week: 0 days    Minutes of Exercise per Session: 0 min  Stress: No Stress Concern Present (01/14/2022)   [employer] of Occupational Health - Occupational Stress Questionnaire    Feeling of Stress : Only a little  Social Connections: Moderately Integrated (03/03/2024)   Social Connection and Isolation Panel    Frequency of Communication with Friends and Family: Three times a week    Frequency of Social Gatherings with Friends and Family: Three times a week    Attends Religious Services: More than 4 times per year    Active Member of Clubs or Organizations: Yes    Attends Banker Meetings: More than 4 times per year    Marital Status: Widowed     Vital Signs: There were no vitals taken for this visit.  Physical Exam  Patient is alert, oriented and able to participate fully in the conversation. No apparent discomfort or distress observed. She appears appropriately dressed.    Imaging:  IVC filter placement 03/03/24     CTV Abd/Pelvis 04/09/24    Labs:  CBC: Recent Labs    03/06/24 1227 03/07/24 1017 03/08/24 0520 03/16/24 1315  WBC 7.4 6.5 4.6 9.4  HGB 11.5* 10.3* 10.6* 13.1  HCT 34.9*  31.3* 31.4* 39.8  PLT 112* 100* 92* 171.0    COAGS: Recent Labs    03/03/24 0932 03/04/24 0037 03/06/24 1227 03/07/24 1017 03/08/24 0520  INR  --   --  1.9* 1.7* 1.8*  APTT >200* >200* 35  --   --     BMP: Recent Labs    03/05/24 0651 03/06/24 0747 03/07/24 1017 03/08/24 0520 03/16/24 1315  NA 136 136 135 140 139  K 4.2 4.0 3.7 4.5 3.8  CL 103 104 108 108 97  CO2 24 24 20* 23 30  GLUCOSE 308* 190* 195* 199* 129*  BUN 25* 18 16 10  28*  CALCIUM 9.0 9.0 8.4* 8.7* 9.4  CREATININE 1.33* 1.11* 1.24* 1.15* 1.25*  GFRNONAA 41* 51* 45* 49*  --     LIVER FUNCTION TESTS: Recent Labs    03/02/24 1733 03/04/24 0037 03/06/24 0747 03/07/24 1017 03/08/24 0520 03/16/24 1315  BILITOT 1.0  --   --  1.0 1.2 1.0  AST 42*  --   --  31 27 35  ALT 31  --   --  19 18 22   ALKPHOS 177*  --   --  131* 137* 244*  PROT 5.8*  --   --  5.1* 5.1* 6.8  ALBUMIN 3.1*   < > 2.8* 2.5* 2.5* 3.8   < > = values in this interval not displayed.    Assessment and Plan:  78 year old female with a history of DVT/PE with recent hospital admission. She underwent a technically successful PE thrombectomy with placement of an IVC filter 03/03/24.  Ester Sides, MD Pager: [phone]    I spent a  total of 25 Minutes in virtual video clinical consultation, greater than 50% of which was counseling/coordinating care for PE/DVT

## 2024-04-12 ENCOUNTER — Ambulatory Visit
Admission: RE | Admit: 2024-04-12 | Discharge: 2024-04-12 | Disposition: A | Discharge status: Home / Self Care | Source: Ambulatory Visit | Attending: Student | Admitting: Student

## 2024-04-12 DIAGNOSIS — I2699 Other pulmonary embolism without acute cor pulmonale: Secondary | ICD-10-CM | POA: Diagnosis not present

## 2024-04-12 DIAGNOSIS — C642 Malignant neoplasm of left kidney, except renal pelvis: Secondary | ICD-10-CM | POA: Diagnosis not present

## 2024-04-12 DIAGNOSIS — Z9889 Other specified postprocedural states: Secondary | ICD-10-CM | POA: Diagnosis not present

## 2024-04-12 DIAGNOSIS — N3020 Other chronic cystitis without hematuria: Secondary | ICD-10-CM | POA: Diagnosis not present

## 2024-04-12 DIAGNOSIS — Z86711 Personal history of pulmonary embolism: Secondary | ICD-10-CM | POA: Diagnosis not present

## 2024-04-12 HISTORY — PX: IR RADIOLOGIST EVAL & MGMT: IMG5224

## 2024-04-12 NOTE — Assessment & Plan Note (Signed)
 Chronic, unfortunately she will not be able to do her nerve ablation now that she is on Eliquis  for several months. She was given the contact number to follow-up with interventional radiology. She is using hydrocodone /promethazine  and Lyrica  but these caused her to feel sleepy and I am concerned they could increase her risk for falls. She has an allergy to lidocaine  and lidocaine  patches.

## 2024-04-12 NOTE — Assessment & Plan Note (Signed)
 Acute IR consulted and underwent pulmonary artery thrombectomy and IVC filter placement on August 13. She was transition to starter pack Eliquis ... Discharged on Eliquis  10 mg twice daily for 3 additional days followed by 5 mg twice daily.

## 2024-04-12 NOTE — Assessment & Plan Note (Signed)
 Acute, due for re-eval.

## 2024-04-12 NOTE — Assessment & Plan Note (Signed)
High risk for falls 

## 2024-04-12 NOTE — Assessment & Plan Note (Signed)
 History of orthostatic hypotension.  Encourage patient follow-up with cardiology for consideration of adjustment of medication.

## 2024-04-12 NOTE — Assessment & Plan Note (Signed)
 Chronic, inadequate control followed by endocrinology

## 2024-04-13 DIAGNOSIS — C642 Malignant neoplasm of left kidney, except renal pelvis: Secondary | ICD-10-CM | POA: Diagnosis not present

## 2024-04-13 DIAGNOSIS — I10 Essential (primary) hypertension: Secondary | ICD-10-CM | POA: Diagnosis not present

## 2024-04-13 DIAGNOSIS — Z23 Encounter for immunization: Secondary | ICD-10-CM | POA: Diagnosis not present

## 2024-04-13 DIAGNOSIS — M8580 Other specified disorders of bone density and structure, unspecified site: Secondary | ICD-10-CM | POA: Diagnosis not present

## 2024-04-13 DIAGNOSIS — K754 Autoimmune hepatitis: Secondary | ICD-10-CM | POA: Diagnosis not present

## 2024-04-13 DIAGNOSIS — E1065 Type 1 diabetes mellitus with hyperglycemia: Secondary | ICD-10-CM | POA: Diagnosis not present

## 2024-04-13 DIAGNOSIS — E7800 Pure hypercholesterolemia, unspecified: Secondary | ICD-10-CM | POA: Diagnosis not present

## 2024-04-13 DIAGNOSIS — F419 Anxiety disorder, unspecified: Secondary | ICD-10-CM | POA: Diagnosis not present

## 2024-04-13 DIAGNOSIS — Z9641 Presence of insulin pump (external) (internal): Secondary | ICD-10-CM | POA: Diagnosis not present

## 2024-04-13 DIAGNOSIS — R911 Solitary pulmonary nodule: Secondary | ICD-10-CM | POA: Diagnosis not present

## 2024-04-16 ENCOUNTER — Ambulatory Visit: Admitting: Family Medicine

## 2024-04-16 ENCOUNTER — Encounter: Payer: Self-pay | Admitting: Family Medicine

## 2024-04-16 VITALS — BP 137/49 | HR 53 | Temp 98.7°F | Ht 63.0 in | Wt 187.0 lb

## 2024-04-16 DIAGNOSIS — M5442 Lumbago with sciatica, left side: Secondary | ICD-10-CM | POA: Diagnosis not present

## 2024-04-16 DIAGNOSIS — M5441 Lumbago with sciatica, right side: Secondary | ICD-10-CM | POA: Diagnosis not present

## 2024-04-16 DIAGNOSIS — R29898 Other symptoms and signs involving the musculoskeletal system: Secondary | ICD-10-CM | POA: Diagnosis not present

## 2024-04-16 DIAGNOSIS — G8929 Other chronic pain: Secondary | ICD-10-CM

## 2024-04-16 MED ORDER — PREGABALIN 100 MG PO CAPS: 100.0000 mg | ORAL_CAPSULE | Freq: Every evening | ORAL | 1 refills | Status: AC | PRN

## 2024-04-16 NOTE — Progress Notes (Signed)
 Patient ID: [name] [name], female    DOB: [birth year], 78 y.o.   MRN: [id number]  This visit was conducted in person.  BP (!) 137/49   Pulse (!) 53   Temp 98.7 F (37.1 C) (Temporal)   Ht 5' 3 (1.6 m)   Wt 187 lb (84.8 kg)   SpO2 98%   BMI 33.13 kg/m    CC:  Chief Complaint  Patient presents with   Medical Management of Chronic Issues    Follow up Medication change Needs handicap placard   Referral    HH PT    Subjective:   HPI: [name] [name] is a 78 y.o. 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  Given she was unable to do nerve ablation now that on Eliquis  for pulmonary embolus, we discussed alternate treatment methods.  Lyrica  100 mg during the day makes her sleepy... want to try one just at night.  Reviewed office visit note from March 25, 2024 for  continued recurrent syncope with Hao Meng PA/Dr. Court  for cardiology Set up with a 30-day live monitor to rule out arrhythmia. Felt the PE may play a role as well as it could be causing orthostatic hypotension. Cardiology was hopeful that with continuous Eliquis  usage the passing out spells will decrease in frequency due to less clot burden.  Recommended follow-up in 6 to 8 weeks.   Lab work for diabetes showed microalbuminuria.  Recommended patient consider Jardiance addition or at least discuss it with her endocrinologist Dr. Balan    When in hospital recently she was treated for Klebsiella pneumonia a UTI with IV antibiotics. March 16, 2024 Hospital follow-up urinalysis was within normal range.  Daughter reports that she needs to be set up with home health  physical therapy as she is unable to do ADLs on her own given significant debilitation and decreased mobility.  [facility]. Can walk with walker  a distance, but if standing still cannot stand more then 5 min.  Troule cooking, washing dishes, sweeping.  Uses bath chair to sit.   No further syncope since last OV in August, does feel dizzy.  She is also requesting handicap placard.  Relevant past medical, surgical, family and social history reviewed and updated as indicated. Interim medical history since our last visit reviewed. Allergies and medications reviewed and updated. Outpatient Medications Prior to Visit  Medication Sig Dispense Refill   albuterol  (PROAIR  HFA) 108 (90 Base) MCG/ACT inhaler Inhale 2 puffs into the lungs every 6 (six) hours as needed for wheezing or shortness of breath. 8 g 2   apixaban  (ELIQUIS ) 5 MG TABS tablet Take 1 tablet (5 mg total) by mouth 2 (two) times daily. 60 tablet 3   atenolol  (TENORMIN ) 25 MG tablet Take 1 tablet (25 mg total) by mouth daily. 90 tablet 3   cetirizine (ZYRTEC) 10 MG tablet Take 10 mg by mouth daily.     ciprofloxacin  (CIPRO ) 500 MG tablet Take 500 mg by mouth 2 (two) times daily.     Coenzyme Q10 (COQ10 PO) Take 1 capsule by mouth daily.     CRANBERRY PO Take 1 capsule by mouth daily.  D-MANNOSE PO Take 2 tablets by mouth daily.     DULoxetine  (CYMBALTA ) 60 MG capsule Take 1 capsule (60 mg total) by mouth daily. 90 capsule 3   furosemide  (LASIX ) 40 MG tablet Take 40 mg by mouth daily.     HYDROcodone -acetaminophen  (NORCO/VICODIN) 5-325 MG tablet Take 1 tablet by mouth 2 (two) times daily as needed for severe pain (pain score 7-10).     insulin  lispro (HUMALOG) 100 UNIT/ML injection Inject 100 Units into the skin See admin instructions. Max daily dose 100 units, administered continuously via pump.     Nitrofurantoin  Monohyd Macro (MACROBID  PO) Take 1 capsule by mouth daily at 6 (six) AM.     pantoprazole  (PROTONIX ) 40 MG tablet TAKE 1  TABLET BY MOUTH EVERY DAY 30 tablet 0   predniSONE  (DELTASONE ) 10 MG tablet Take 10 mg by mouth daily with breakfast.     predniSONE  (DELTASONE ) 50 MG tablet Take one tablet 13 hours, 7 hours and 1 hour prior to your imaging study. 3 tablet 0   promethazine  (PHENERGAN ) 12.5 MG tablet Take 12.5 mg by mouth 2 (two) times daily as needed for nausea.     pregabalin  (LYRICA ) 100 MG capsule Take 100 mg by mouth 2 (two) times daily as needed (neuropathy).     senna-docusate (SENOKOT-S) 8.6-50 MG tablet Take 1-2 tablets by mouth 2 (two) times daily between meals as needed for mild constipation or moderate constipation. (Patient not taking: Reported on 04/16/2024)     No facility-administered medications prior to visit.     Per HPI unless specifically indicated in ROS section below Review of Systems  Constitutional:  Positive for fatigue. Negative for fever.  HENT:  Negative for congestion.   Eyes:  Negative for pain.  Respiratory:  Positive for shortness of breath. Negative for cough.   Cardiovascular:  Negative for chest pain, palpitations and leg swelling.  Gastrointestinal:  Negative for abdominal pain.  Genitourinary:  Negative for dysuria and vaginal bleeding.  Musculoskeletal:  Positive for back pain.  Neurological:  Positive for weakness. Negative for syncope, light-headedness and headaches.  Psychiatric/Behavioral:  Positive for dysphoric mood and sleep disturbance.    Objective:  BP (!) 137/49   Pulse (!) 53   Temp 98.7 F (37.1 C) (Temporal)   Ht 5' 3 (1.6 m)   Wt 187 lb (84.8 kg)   SpO2 98%   BMI 33.13 kg/m   Wt Readings from Last 3 Encounters:  04/16/24 187 lb (84.8 kg)  03/25/24 179 lb 12.8 oz (81.6 kg)  03/16/24 180 lb (81.6 kg)      Physical Exam Constitutional:      General: She is not in acute distress.    Appearance: Normal appearance. She is well-developed. She is not ill-appearing or toxic-appearing.  HENT:     Head: Normocephalic.     Right Ear: Hearing,  tympanic membrane, ear canal and external ear normal. Tympanic membrane is not erythematous, retracted or bulging.     Left Ear: Hearing, tympanic membrane, ear canal and external ear normal. Tympanic membrane is not erythematous, retracted or bulging.     Nose: No mucosal edema or rhinorrhea.     Right Sinus: No maxillary sinus tenderness or frontal sinus tenderness.     Left Sinus: No maxillary sinus tenderness or frontal sinus tenderness.     Mouth/Throat:     Pharynx: Uvula midline.  Eyes:     General: Lids are normal. Lids are everted, no foreign bodies appreciated.  Conjunctiva/sclera: Conjunctivae normal.     Pupils: Pupils are equal, round, and reactive to light.  Neck:     Thyroid : No thyroid  mass or thyromegaly.     Vascular: No carotid bruit.     Trachea: Trachea normal.  Cardiovascular:     Rate and Rhythm: Normal rate and regular rhythm.     Pulses: Normal pulses.     Heart sounds: Normal heart sounds, S1 normal and S2 normal. No murmur heard.    No friction rub. No gallop.  Pulmonary:     Effort: Pulmonary effort is normal. No tachypnea or respiratory distress.     Breath sounds: Normal breath sounds. No decreased breath sounds, wheezing, rhonchi or rales.  Abdominal:     General: Bowel sounds are normal.     Palpations: Abdomen is soft.     Tenderness: There is no abdominal tenderness.  Musculoskeletal:     Cervical back: Normal range of motion and neck supple.  Skin:    General: Skin is warm and dry.     Findings: No rash.  Neurological:     Mental Status: She is alert.  Psychiatric:        Mood and Affect: Mood is not anxious or depressed.        Speech: Speech normal.        Behavior: Behavior normal. Behavior is cooperative.        Thought Content: Thought content normal.        Judgment: Judgment normal.       Results for orders placed or performed in visit on 03/16/24  Microalbumin / creatinine urine ratio   Collection Time: 03/16/24  1:15 PM   Result Value Ref Range   Microalb, Ur 4.7 (H) 0.0 - 1.9 mg/dL   Creatinine,U 80.9 mg/dL   Microalb Creat Ratio 249.1 (H) 0.0 - 30.0 mg/g  CBC with Differential/Platelet   Collection Time: 03/16/24  1:15 PM  Result Value Ref Range   WBC 9.4 4.0 - 10.5 K/uL   RBC 4.32 3.87 - 5.11 Mil/uL   Hemoglobin 13.1 12.0 - 15.0 g/dL   HCT 60.1 63.9 - 53.9 %   MCV 92.2 78.0 - 100.0 fl   MCHC 32.9 30.0 - 36.0 g/dL   RDW 84.5 88.4 - 84.4 %   Platelets 171.0 150.0 - 400.0 K/uL   Neutrophils Relative % 65.5 43.0 - 77.0 %   Lymphocytes Relative 25.0 12.0 - 46.0 %   Monocytes Relative 7.2 3.0 - 12.0 %   Eosinophils Relative 1.5 0.0 - 5.0 %   Basophils Relative 0.8 0.0 - 3.0 %   Neutro Abs 6.2 1.4 - 7.7 K/uL   Lymphs Abs 2.4 0.7 - 4.0 K/uL   Monocytes Absolute 0.7 0.1 - 1.0 K/uL   Eosinophils Absolute 0.1 0.0 - 0.7 K/uL   Basophils Absolute 0.1 0.0 - 0.1 K/uL  Comprehensive metabolic panel with GFR   Collection Time: 03/16/24  1:15 PM  Result Value Ref Range   Sodium 139 135 - 145 mEq/L   Potassium 3.8 3.5 - 5.1 mEq/L   Chloride 97 96 - 112 mEq/L   CO2 30 19 - 32 mEq/L   Glucose, Bld 129 (H) 70 - 99 mg/dL   BUN 28 (H) 6 - 23 mg/dL   Creatinine, Ser 8.74 (H) 0.40 - 1.20 mg/dL   Total Bilirubin 1.0 0.2 - 1.2 mg/dL   Alkaline Phosphatase 244 (H) 39 - 117 U/L   AST 35 0 - 37 U/L  ALT 22 0 - 35 U/L   Total Protein 6.8 6.0 - 8.3 g/dL   Albumin 3.8 3.5 - 5.2 g/dL   GFR 58.58 (L) >39.99 mL/min   Calcium 9.4 8.4 - 10.5 mg/dL  POCT Urinalysis Dipstick (Automated)   Collection Time: 03/16/24  1:21 PM  Result Value Ref Range   Color, UA yellow    Clarity, UA clear    Glucose, UA Negative Negative   Bilirubin, UA negative    Ketones, UA negative    Spec Grav, UA 1.010 1.010 - 1.025   Blood, UA negative    pH, UA 6.0 5.0 - 8.0   Protein, UA Negative Negative   Urobilinogen, UA 0.2 0.2 or 1.0 E.U./dL   Nitrite, UA negative    Leukocytes, UA Negative Negative    Assessment and  Plan  Muscular deconditioning Assessment & Plan: Patient with significant muscle wasting MD habilitation causing decreased mobility and difficulty to perform all ADLs on her home.  She is having trouble cooking for herself, washing dishes, sweeping, bathing and dressing.  Referral placed to home health for physical therapy, Occupational Therapy and caregiver. Handicap placard completed.   Orders: -     Ambulatory referral to Home Health  Chronic bilateral low back pain with bilateral sciatica Assessment & Plan: Acute worsening of chronic issue. Will try changing Lyrica  to p.m. to see if she gets relief from back pain without daytime somnolence.  Orders: -     Ambulatory referral to Home Health  Other orders -     Pregabalin ; Take 1 capsule (100 mg total) by mouth at bedtime as needed.  Dispense: 30 capsule; Refill: 1     Return in about 6 months (around 10/14/2024) for  follow up 6 months.   Greig Ring, MD

## 2024-04-20 ENCOUNTER — Telehealth: Payer: Self-pay | Admitting: Family Medicine

## 2024-04-20 NOTE — Telephone Encounter (Signed)
 Verbal order given to Jalissa via telephone for PT evaluation.

## 2024-04-20 NOTE — Telephone Encounter (Signed)
 Copied from CRM #[id number]. Topic: Clinical - Home Health Verbal Orders >> Apr 20, 2024 10:15 AM Logan F wrote: Caller/Agency: [facility]  Callback Number: [phone] Service Requested: Physical Therapy Frequency: Eval for once a week  Any new concerns about the patient? No

## 2024-04-23 DIAGNOSIS — M47816 Spondylosis without myelopathy or radiculopathy, lumbar region: Secondary | ICD-10-CM | POA: Diagnosis not present

## 2024-04-23 DIAGNOSIS — M47896 Other spondylosis, lumbar region: Secondary | ICD-10-CM | POA: Diagnosis not present

## 2024-04-23 DIAGNOSIS — M7918 Myalgia, other site: Secondary | ICD-10-CM | POA: Diagnosis not present

## 2024-04-27 ENCOUNTER — Ambulatory Visit: Payer: Self-pay | Admitting: Physician Assistant

## 2024-04-27 DIAGNOSIS — R55 Syncope and collapse: Secondary | ICD-10-CM

## 2024-04-28 ENCOUNTER — Other Ambulatory Visit: Payer: Self-pay | Admitting: Interventional Radiology

## 2024-04-28 DIAGNOSIS — Z95828 Presence of other vascular implants and grafts: Secondary | ICD-10-CM

## 2024-04-30 ENCOUNTER — Telehealth: Payer: Self-pay | Admitting: *Deleted

## 2024-04-30 DIAGNOSIS — N3946 Mixed incontinence: Secondary | ICD-10-CM | POA: Diagnosis not present

## 2024-04-30 NOTE — Telephone Encounter (Signed)
 Copied from CRM [id number]. Topic: Clinical - Home Health Verbal Orders >> Apr 30, 2024  9:35 AM Mercedes MATSU wrote: Caller/Agency: [facility] L[facility] ([facility]) Callback Number: [phone] (secure line) Service Requested: Physical Therapy Frequency: 1 week for 9 weeks Any new concerns about the patient? Yes

## 2024-04-30 NOTE — Telephone Encounter (Signed)
 Left message for [name] [name] giving verbal orders for PT 1 x week for 9 weeks.

## 2024-05-01 DIAGNOSIS — R29898 Other symptoms and signs involving the musculoskeletal system: Secondary | ICD-10-CM | POA: Insufficient documentation

## 2024-05-01 NOTE — Assessment & Plan Note (Signed)
 Acute worsening of chronic issue. Will try changing Lyrica  to p.m. to see if she gets relief from back pain without daytime somnolence.

## 2024-05-01 NOTE — Assessment & Plan Note (Signed)
 Patient with significant muscle wasting MD habilitation causing decreased mobility and difficulty to perform all ADLs on her home.  She is having trouble cooking for herself, washing dishes, sweeping, bathing and dressing.  Referral placed to home health for physical therapy, Occupational Therapy and caregiver. Handicap placard completed.

## 2024-05-02 ENCOUNTER — Emergency Department (HOSPITAL_COMMUNITY)

## 2024-05-02 ENCOUNTER — Emergency Department (HOSPITAL_COMMUNITY)
Admission: EM | Admit: 2024-05-02 | Discharge: 2024-05-02 | Disposition: A | Discharge status: Home / Self Care | Attending: Emergency Medicine | Admitting: Emergency Medicine

## 2024-05-02 DIAGNOSIS — M25562 Pain in left knee: Secondary | ICD-10-CM | POA: Diagnosis not present

## 2024-05-02 DIAGNOSIS — R220 Localized swelling, mass and lump, head: Secondary | ICD-10-CM | POA: Diagnosis not present

## 2024-05-02 DIAGNOSIS — M5030 Other cervical disc degeneration, unspecified cervical region: Secondary | ICD-10-CM | POA: Diagnosis not present

## 2024-05-02 DIAGNOSIS — Z23 Encounter for immunization: Secondary | ICD-10-CM | POA: Diagnosis not present

## 2024-05-02 DIAGNOSIS — S0993XA Unspecified injury of face, initial encounter: Secondary | ICD-10-CM | POA: Diagnosis present

## 2024-05-02 DIAGNOSIS — S8002XA Contusion of left knee, initial encounter: Secondary | ICD-10-CM | POA: Diagnosis not present

## 2024-05-02 DIAGNOSIS — W01198A Fall on same level from slipping, tripping and stumbling with subsequent striking against other object, initial encounter: Secondary | ICD-10-CM | POA: Diagnosis not present

## 2024-05-02 DIAGNOSIS — S022XXA Fracture of nasal bones, initial encounter for closed fracture: Secondary | ICD-10-CM | POA: Diagnosis not present

## 2024-05-02 DIAGNOSIS — S199XXA Unspecified injury of neck, initial encounter: Secondary | ICD-10-CM | POA: Diagnosis not present

## 2024-05-02 DIAGNOSIS — Z7901 Long term (current) use of anticoagulants: Secondary | ICD-10-CM | POA: Insufficient documentation

## 2024-05-02 DIAGNOSIS — S0990XA Unspecified injury of head, initial encounter: Secondary | ICD-10-CM | POA: Diagnosis not present

## 2024-05-02 DIAGNOSIS — W19XXXA Unspecified fall, initial encounter: Secondary | ICD-10-CM

## 2024-05-02 LAB — CBG MONITORING, ED: Glucose-Capillary: 88 mg/dL (ref 70–99)

## 2024-05-02 MED ORDER — TETANUS-DIPHTH-ACELL PERTUSSIS 5-2-15.5 LF-MCG/0.5 IM SUSP
0.5000 mL | Freq: Once | INTRAMUSCULAR | Status: AC
  Administered 2024-05-02: 0.5 mL via INTRAMUSCULAR
  Filled 2024-05-02: qty 0.5

## 2024-05-02 NOTE — ED Triage Notes (Signed)
 Pt states that she was sleeping and her phone went off, so she got up quickly to go to answer it and fell, striking her face on the hardwood floor. Pt denies LOC. Anticoagulated on Eliquis . Pt also c/o L knee pain.

## 2024-05-02 NOTE — Discharge Instructions (Signed)
 Keep your leg elevated.  Take your pain medicines as needed.  Follow-up with the ENT doctor if your nose is not healing.  And follow-up with your family doctor this week for your knee

## 2024-05-03 DIAGNOSIS — E1065 Type 1 diabetes mellitus with hyperglycemia: Secondary | ICD-10-CM | POA: Diagnosis not present

## 2024-05-03 NOTE — ED Provider Notes (Signed)
 [place] [facility]MENT AT [facility] Provider Note   CSN: [id number] Arrival date & time: 05/02/24  1919     Patient presents with: [name] [name] is a 78 y.o. female.   Patient fell and hit her face.  Patient also complains of left knee pain  The history is provided by the patient and medical records. No language interpreter was used.  Fall This is a new problem. The current episode started 6 to 12 hours ago. The problem occurs rarely. The problem has been resolved. Pertinent negatives include no chest pain, no abdominal pain and no headaches. Nothing aggravates the symptoms. Nothing relieves the symptoms. She has tried nothing for the symptoms.       Prior to Admission medications   Medication Sig Start Date End Date Taking? Authorizing Provider  albuterol  (PROAIR  HFA) 108 (90 Base) MCG/ACT inhaler Inhale 2 puffs into the lungs every 6 (six) hours as needed for wheezing or shortness of breath. 06/13/23   Bedsole, Amy E, MD  apixaban  (ELIQUIS ) 5 MG TABS tablet Take 1 tablet (5 mg total) by mouth 2 (two) times daily. 04/05/24   Federico Norleen ONEIDA MADISON, MD  atenolol  (TENORMIN ) 25 MG tablet Take 1 tablet (25 mg total) by mouth daily. 03/16/24   Bedsole, Amy E, MD  cetirizine (ZYRTEC) 10 MG tablet Take 10 mg by mouth daily.    [provider]  ciprofloxacin  (CIPRO ) 500 MG tablet Take 500 mg by mouth 2 (two) times daily. 04/13/24   [provider]  Coenzyme Q10 (COQ10 PO) Take 1 capsule by mouth daily.    [provider]  CRANBERRY PO Take 1 capsule by mouth daily.    [provider]  D-MANNOSE PO Take 2 tablets by mouth daily.    [provider]  DULoxetine  (CYMBALTA ) 60 MG capsule Take 1 capsule (60 mg total) by mouth daily. 07/11/23   Bedsole, Amy E, MD  furosemide  (LASIX ) 40 MG tablet Take 40 mg by mouth daily.    [provider]  HYDROcodone -acetaminophen  (NORCO/VICODIN) 5-325 MG tablet Take 1 tablet by mouth  2 (two) times daily as needed for severe pain (pain score 7-10). 02/16/24   [provider]  insulin  lispro (HUMALOG) 100 UNIT/ML injection Inject 100 Units into the skin See admin instructions. Max daily dose 100 units, administered continuously via pump.    [provider]  Nitrofurantoin  Monohyd Macro (MACROBID  PO) Take 1 capsule by mouth daily at 6 (six) AM.    [provider]  pantoprazole  (PROTONIX ) 40 MG tablet TAKE 1 TABLET BY MOUTH EVERY DAY 09/17/19   Wendling, Mabel Mt, DO  predniSONE  (DELTASONE ) 10 MG tablet Take 10 mg by mouth daily with breakfast.    [provider]  predniSONE  (DELTASONE ) 50 MG tablet Take one tablet 13 hours, 7 hours and 1 hour prior to your imaging study. 04/05/24   Covington, Jamie R, NP  pregabalin  (LYRICA ) 100 MG capsule Take 1 capsule (100 mg total) by mouth at bedtime as needed. 04/16/24   Bedsole, Amy E, MD  promethazine  (PHENERGAN ) 12.5 MG tablet Take 12.5 mg by mouth 2 (two) times daily as needed for nausea. 02/16/24   [provider]  senna-docusate (SENOKOT-S) 8.6-50 MG tablet Take 1-2 tablets by mouth 2 (two) times daily between meals as needed for mild constipation or moderate constipation. Patient not taking: Reported on 04/16/2024 03/08/24   Gonfa, Taye T, MD    Allergies: Auvi-q  [epinephrine ], Codeine , E.e.s. [erythromycin],  Eucalyptus oil, Glucophage  [metformin ], Iodinated contrast media, Iohexol , Lortab [hydrocodone -acetaminophen ], Menthol, Roxicodone  [oxycodone ], Tylenol  [acetaminophen ], Vibramycin  [doxycycline ], Xylocaine  [lidocaine ], and Tape    Review of Systems  Constitutional:  Negative for appetite change and fatigue.  HENT:  Negative for congestion, ear discharge and sinus pressure.        Nasal pain  Eyes:  Negative for discharge.  Respiratory:  Negative for cough.   Cardiovascular:  Negative for chest pain.  Gastrointestinal:  Negative for abdominal pain and diarrhea.  Genitourinary:   Negative for frequency and hematuria.  Musculoskeletal:  Negative for back pain.       Left knee pain  Skin:  Negative for rash.  Neurological:  Negative for seizures and headaches.  Psychiatric/Behavioral:  Negative for hallucinations.     Updated Vital Signs BP (!) 123/100 (BP Location: Right Arm)   Pulse (!) 53   Temp 97.7 F (36.5 C) (Oral)   Resp 16   SpO2 100%   Physical Exam Vitals and nursing note reviewed.  Constitutional:      Appearance: She is well-developed.  HENT:     Head: Normocephalic.     Nose:     Comments: Tender nodes with small abrasion Eyes:     General: No scleral icterus.    Conjunctiva/sclera: Conjunctivae normal.  Neck:     Thyroid : No thyromegaly.  Cardiovascular:     Rate and Rhythm: Normal rate and regular rhythm.     Heart sounds: No murmur heard.    No friction rub. No gallop.  Pulmonary:     Breath sounds: No stridor. No wheezing or rales.  Chest:     Chest wall: No tenderness.  Abdominal:     General: There is no distension.     Tenderness: There is no abdominal tenderness. There is no rebound.  Musculoskeletal:        General: Normal range of motion.     Cervical back: Neck supple.     Comments: Swollen left knee with large hematoma  Lymphadenopathy:     Cervical: No cervical adenopathy.  Skin:    Findings: No erythema or rash.  Neurological:     Mental Status: She is oriented to person, place, and time.     Motor: No abnormal muscle tone.     Coordination: Coordination normal.  Psychiatric:        Behavior: Behavior normal.     (all labs ordered are listed, but only abnormal results are displayed) Labs Reviewed  CBG MONITORING, ED    EKG: None  Radiology: DG Knee Complete 4 Views Left Result Date: 05/02/2024 CLINICAL DATA:  Fall, pain EXAM: LEFT KNEE - COMPLETE 4+ VIEW COMPARISON:  None Available. FINDINGS: Anterior soft tissue swelling. No acute bony abnormality. Specifically, no fracture, subluxation, or  dislocation. No joint effusion. Joint space narrowing in the medial and patellofemoral compartments. IMPRESSION: Anterior soft tissue swelling.  No acute bony abnormality. Electronically Signed   By: Franky Crease M.D.   On: 05/02/2024 21:00   CT Head Wo Contrast Result Date: 05/02/2024 CLINICAL DATA:  Head trauma, moderate-severe.  Fall. EXAM: CT HEAD WITHOUT CONTRAST TECHNIQUE: Contiguous axial images were obtained from the base of the skull through the vertex without intravenous contrast. RADIATION DOSE REDUCTION: This exam was performed according to the departmental dose-optimization program which includes automated exposure control, adjustment of the mA and/or kV according to patient size and/or use of iterative reconstruction technique. COMPARISON:  03/02/2024 FINDINGS: Brain: Diffuse cerebral atrophy, somewhat more pronounced in  the frontal lobes. No acute intracranial abnormality. Specifically, no hemorrhage, hydrocephalus, mass lesion, acute infarction, or significant intracranial injury. Vascular: No hyperdense vessel or unexpected calcification. Skull: No acute calvarial abnormality. Sinuses/Orbits: No acute findings Other: None IMPRESSION: No acute intracranial abnormality. Electronically Signed   By: Franky Crease M.D.   On: 05/02/2024 20:52   CT Cervical Spine Wo Contrast Result Date: 05/02/2024 CLINICAL DATA:  Neck trauma, intoxicated or obtunded (Age >= 16y). Fall. EXAM: CT CERVICAL SPINE WITHOUT CONTRAST TECHNIQUE: Multidetector CT imaging of the cervical spine was performed without intravenous contrast. Multiplanar CT image reconstructions were also generated. RADIATION DOSE REDUCTION: This exam was performed according to the departmental dose-optimization program which includes automated exposure control, adjustment of the mA and/or kV according to patient size and/or use of iterative reconstruction technique. COMPARISON:  03/02/2024 FINDINGS: Alignment: Normal Skull base and vertebrae: No  acute fracture. No primary bone lesion or focal pathologic process. Soft tissues and spinal canal: No prevertebral fluid or swelling. No visible canal hematoma. Disc levels:  Multilevel degenerative disc and facet disease. Upper chest: No acute findings Other: None IMPRESSION: No acute bony abnormality. Electronically Signed   By: Franky Crease M.D.   On: 05/02/2024 20:51   CT Maxillofacial Wo Contrast Result Date: 05/02/2024 CLINICAL DATA:  Facial trauma, blunt.  Fall, hit face. EXAM: CT MAXILLOFACIAL WITHOUT CONTRAST TECHNIQUE: Multidetector CT imaging of the maxillofacial structures was performed. Multiplanar CT image reconstructions were also generated. RADIATION DOSE REDUCTION: This exam was performed according to the departmental dose-optimization program which includes automated exposure control, adjustment of the mA and/or kV according to patient size and/or use of iterative reconstruction technique. COMPARISON:  None Available. FINDINGS: Osseous: Mildly angulated nasal bone fractures. No additional facial fractures. Zygomatic arches and mandible intact. Orbits: No orbital fracture or emphysema.  Globes intact. Sinuses: Clear Soft tissues: Soft tissue swelling in the nose. Limited intracranial: See head CT report IMPRESSION: Mildly angulated nasal bone fractures. Electronically Signed   By: Franky Crease M.D.   On: 05/02/2024 20:50     Procedures   Medications Ordered in the ED  Tdap (ADACEL) injection 0.5 mL (0.5 mLs Intramuscular Given 05/02/24 2211)                                    Medical Decision Making Amount and/or Complexity of Data Reviewed Radiology: ordered.  Risk Prescription drug management.  Patient with contusion to left knee and nasal fracture.  She is to follow-up with her primary care doctor for her knee and is referred to ENT for her broken nose     Final diagnoses:  Fall, initial encounter  Closed fracture of nasal bone, initial encounter    ED Discharge  Orders     None          Suzette Pac, MD 05/03/24 1127

## 2024-05-04 ENCOUNTER — Ambulatory Visit

## 2024-05-04 ENCOUNTER — Ambulatory Visit: Payer: Self-pay

## 2024-05-04 ENCOUNTER — Telehealth: Payer: Self-pay | Admitting: Family Medicine

## 2024-05-04 NOTE — Telephone Encounter (Signed)
 Noted

## 2024-05-04 NOTE — Telephone Encounter (Signed)
 FYI Only or Action Required?: FYI only for provider.  Patient was last seen in primary care on 04/16/2024 by Avelina Greig [name], MD.  Called Nurse Triage reporting Fall.  Symptoms began several days ago.  Interventions attempted: Rest, hydration, or home remedies.  Symptoms are: stable.  Triage Disposition: Home Care  Patient/caregiver understands and will follow disposition?: Yes Reason for Disposition  Preventing falls, questions about  Answer Assessment - Initial Assessment Questions Jerel from [facility] returning call. Patient is on Eliquis . C/o slight headache. Jerel stated patient, and patients family have been advised to return to ED if anything changes or worsens.  1. ONSET: When did the fall happen? (e.g., minutes, hours, or days ago)     Sunday  2. LOCATION: What part of the body hit the ground? (e.g., back, buttocks, head, hips, knees, hands, head, stomach)     Hit face  3. INJURY: Did you hurt (injure) yourself when you fell? If Yes, ask: What did you injure? Tell me more about this? (e.g., body area; type of injury; pain severity)     Nasal fracture  4. PAIN: Is there any pain? If Yes, ask: How bad is the pain? (e.g., Scale 0-10; or none, mild,      Pain when exerting herself walking  5. SIZE: For cuts, bruises, or swelling, ask: How large is it? (e.g., inches or centimeters)      Bruising  6. OTHER SYMPTOMS: Do you have any other symptoms? (e.g., dizziness, fever, weakness; new-onset or worsening).      Slight dizziness with eye movements and exerting herself.  Protocols used: Falls and [facility]  Copied from CRM [id number]. Topic: General - Other >> May 04, 2024  1:28 PM [name] wrote: Reason for CRM: Patient had a fall on Sunday and was seen a the emergency department. She is having headaches but nothing too major. She is still taking eliquis . >> May 04, 2024  3:39 PM [name] wrote: Jerel calling from [facility]  returning a call from [place] to be triaged.

## 2024-05-04 NOTE — Telephone Encounter (Signed)
 Copied from CRM [id number]. Topic: General - Other >> May 04, 2024  1:28 PM [name] [name] wrote: Reason for CRM: Patient had a fall on Sunday and was seen a the emergency department. She is having headaches but nothing too major. She is still taking eliquis .

## 2024-05-04 NOTE — Telephone Encounter (Signed)
 I left v/m for [name] to call [phone]. I left Jerel Gurney [facility] PT to call [phone]. I spoke with Porter (DPR signed) and she has not spoken with pt today. Porter said that usually Ginny took care of doctor calls but Rico is out of town and Porter will speak with her brother about pt and someone will CB today to get pt triaged. E2C2 can triage pt.

## 2024-05-05 DIAGNOSIS — I4891 Unspecified atrial fibrillation: Secondary | ICD-10-CM | POA: Diagnosis not present

## 2024-05-05 DIAGNOSIS — N390 Urinary tract infection, site not specified: Secondary | ICD-10-CM | POA: Diagnosis not present

## 2024-05-05 DIAGNOSIS — F0394 Unspecified dementia, unspecified severity, with anxiety: Secondary | ICD-10-CM | POA: Diagnosis not present

## 2024-05-05 DIAGNOSIS — I951 Orthostatic hypotension: Secondary | ICD-10-CM | POA: Diagnosis not present

## 2024-05-05 DIAGNOSIS — I5030 Unspecified diastolic (congestive) heart failure: Secondary | ICD-10-CM | POA: Diagnosis not present

## 2024-05-05 DIAGNOSIS — I2602 Saddle embolus of pulmonary artery with acute cor pulmonale: Secondary | ICD-10-CM | POA: Diagnosis not present

## 2024-05-05 DIAGNOSIS — S51812D Laceration without foreign body of left forearm, subsequent encounter: Secondary | ICD-10-CM | POA: Diagnosis not present

## 2024-05-05 DIAGNOSIS — K219 Gastro-esophageal reflux disease without esophagitis: Secondary | ICD-10-CM | POA: Diagnosis not present

## 2024-05-05 DIAGNOSIS — N1831 Chronic kidney disease, stage 3a: Secondary | ICD-10-CM | POA: Diagnosis not present

## 2024-05-05 DIAGNOSIS — E1122 Type 2 diabetes mellitus with diabetic chronic kidney disease: Secondary | ICD-10-CM | POA: Diagnosis not present

## 2024-05-05 DIAGNOSIS — F32A Depression, unspecified: Secondary | ICD-10-CM | POA: Diagnosis not present

## 2024-05-05 DIAGNOSIS — F0393 Unspecified dementia, unspecified severity, with mood disturbance: Secondary | ICD-10-CM | POA: Diagnosis not present

## 2024-05-07 NOTE — Telephone Encounter (Unsigned)
 Copied from CRM #[id number]. Topic: General - Other >> May 07, 2024 12:37 PM Pinkey ORN wrote: Reason for CRM: Home Health Report >> May 07, 2024 12:43 PM Pinkey ORN wrote: [place] [facility] [phone]  Called on behalf of patient to report she experienced a fall on last Sunday 05/02/24, patient was seen in the emergency department and cleared same day. Patient has some extremity bilateral lower and upper bruising as well as bruising on the chin and underneath eyes.

## 2024-05-10 ENCOUNTER — Telehealth: Payer: Self-pay | Admitting: Cardiovascular Disease

## 2024-05-10 NOTE — Telephone Encounter (Signed)
 Unable to leave voicemail 10/20

## 2024-05-10 NOTE — Telephone Encounter (Signed)
 Daughter is calling in to speak to the nurse about the patient. Please advise

## 2024-05-10 NOTE — Telephone Encounter (Signed)
 Lvm to call office

## 2024-05-11 ENCOUNTER — Ambulatory Visit: Admitting: Physician Assistant

## 2024-05-11 NOTE — Telephone Encounter (Signed)
 Left message for pt to call.

## 2024-05-13 NOTE — Telephone Encounter (Signed)
 Called pt and daughter stated that pt doesn't need a hospital f/u the hospital took care of everything pt doesn't need a medication change either if anything change they will call the office

## 2024-05-13 NOTE — Telephone Encounter (Signed)
 Noted

## 2024-05-18 ENCOUNTER — Telehealth: Payer: Self-pay

## 2024-05-18 MED ORDER — PREDNISONE 50 MG PO TABS: ORAL_TABLET | ORAL | 0 refills | Status: AC

## 2024-05-18 NOTE — Discharge Instructions (Signed)
 Inferior Vena Cava Filter Removal, Care After   The following information offers guidance on how to care for yourself after your procedure. Your health care provider may also give you more specific instructions. If you have problems or questions, contact your health care provider. What can I expect after the procedure? After the procedure, it is common to have: Mild pain and bruising around the area where the long, thin tube (catheter) was inserted in your neck or groin. Tiredness (fatigue). Follow these instructions at home: Insertion site care  Follow instructions from your health care provider about how to take care of your catheter insertion site. Make sure you: Wash your hands with soap and water  for at least 20 seconds before and after you change your bandage (dressing). If soap and water  are not available, use hand sanitizer. Change your dressing as told by your health care provider. Keep the dressing and the insertion site clean and dry. Check your insertion site every day for signs of infection. Check for: More redness, swelling, or pain. Fluid or blood. Warmth. Pus or a bad smell. Do not take baths, swim, or use a hot tub until your health care provider approves. Ask your health care provider if you may take showers. You may only be allowed to take sponge baths. Activity Avoid strenuous exercise or activities that take a lot of effort for 48 hours after the procedure or as told by your health care provider. Return to your normal activities as told by your health care provider. Ask your health care provider what activities are safe for you. You may have to avoid lifting. Ask your health care provider how much you can safely lift. General instructions Take over-the-counter and prescription medicines only as told by your health care provider. If you were given a sedative during the procedure, it can affect you for several hours. Do not drive or operate machinery until your health  care provider says that it is safe. Keep all follow-up visits. This is important. Contact a health care provider if: You have any of these signs of infection: More redness, swelling, or pain around your catheter insertion site. Fluid or blood coming from your insertion site. Warmth coming from your insertion site. Pus or a bad smell coming from your insertion site. Chills or a fever. You are dizzy. You have nausea or vomiting. You develop a rash. Get help right away if: You have blood coming from your catheter insertion site (active bleeding). If you have bleeding from the insertion site, lie down, apply pressure to the area with a clean cloth or gauze, and get help right away. You have chest pain, a cough, or difficulty breathing. You have shortness of breath, feel faint, or pass out. You cough up blood. You have severe pain in your abdomen. You develop swelling and discoloration or pain in your legs. Your legs become pale and cold or blue. You have weakness, difficulty moving your arms or legs, or balance problems. You develop problems with speech or vision. These symptoms may be an emergency. Get help right away. Call 911. Do not wait to see if the symptoms will go away. Do not drive yourself to the hospital. Summary Follow instructions from your health care provider about how to take care of your catheter insertion site. Return to your normal activities as told by your health care provider. Check your catheter insertion site every day for signs of infection. Get help right away if you have active bleeding, chest pain, or trouble  breathing. This information is not intended to replace advice given to you by your health care provider. Make sure you discuss any questions you have with your health care provider. Document Revised: 07/24/2021 Document Reviewed: 07/24/2021 Elsevier Patient Education  2024 [employer].

## 2024-05-18 NOTE — Progress Notes (Signed)
 See telephone note

## 2024-05-18 NOTE — Progress Notes (Signed)
 13 hour prep e-scribed to pharmacy under [employer], confirmed with pt's daughter, pt's daughter states they have benadryl   Pt to take 50 mg of prednisone  on 05/21/24 @ midnight, 50 mg of prednisone  on 05/21/24 at 6am, and 50 mg of prednisone  on 05/21/24 @ 1200 (noon). Pt is also to take 50 mg of benadryl  on 05/21/24 @ 1200 (noon). Please call [phone] with any questions.

## 2024-05-19 NOTE — Progress Notes (Signed)
 Mliss PARAS., RN called this RN and informed me that she spoke with daughter this AM, both RN's aware of EPIC msg sent/received about cardiology approving or not approving of IVCF removal, will continue with procedure on 10/31, see Dr. Terrill note

## 2024-05-20 NOTE — Progress Notes (Signed)
 Chief Complaint: Patient was seen in consultation today for pulmonary embolism s/p PE thrombectomy and IVC filter placement 03/03/24  Referring Physician(s): Dr. Paula Alghanim   Patient Status: [facility] - Outpatient   History of Present Illness: [name] [name] is a 78 y.o. female with a medical history significant for renal cell carcinoma s/p nephrectomy, DM, CHF, orthostatic hypotension, autoimmune hepatitis/cirrhosis and pulmonary embolism on Eliquis .    She presented to the ED 03/02/24 with injuries related to a fall/syncopal event. Imaging obtained in the ED showed worsening PE with evidence of right heart strain. A lower extremity duplex study was positive for bilateral DVT and IR was consulted for PE thrombectomy with IVC filter placement.  On 03/03/24 I performed both of these procedure with scant removal of thrombus from the right pulmonary artery. Pulmonary manometry showed a reduction in mean pulmonary artery pressure from 20 mm Hg to 12 mm Hg. An IVC filter was successfully placed.    The patient tolerated both of these procedures well and had an uneventful recovery in the hospital. She was discharged on Eliquis . She followed up with me 04/12/24 and we discussed the rationale, periprocedural expectations, risks and benefits of IVC filter retrieval. Her recent imaging demonstrated near complete resolution of the deep vein thrombosis.   The patient was agreeable to proceed and she presents today for IVC filter retrieval.   Past Medical History:  Diagnosis Date   Allergic rhinitis, cause unspecified    Allergy    Anxiety    Arthritis    hands, feet and back   Asthma    Benign neoplasm of colon    Blood transfusion without reported diagnosis    Bursitis    hips   Cataract    CHF (congestive heart failure) (HCC)    Cirrhosis (HCC)    auto immune   Complication of anesthesia     i WAKE UP WITH TREMORS  after shoulder surgery tremors for 10 months after surgery  surgery- 2009- surgical center in [place]   Cyst and pseudocyst of pancreas    Depressive disorder, not elsewhere classified    Diabetes mellitus without complication (HCC)    Diverticulitis    Diverticulosis of colon (without mention of hemorrhage)    Dyspnea    Dysrhythmia    arrhythmia   Emphysema of lung (HCC)    Esophageal reflux    Essential and other specified forms of tremor    History of GI bleed    History of kidney stones    Irritable bowel syndrome    Obesity, unspecified    Other specified cardiac dysrhythmias(427.89)    Periapical abscess without sinus    Polyneuropathy in diabetes(357.2)    PONV (postoperative nausea and vomiting)    Pure hypercholesterolemia    Renal cell carcinoma (HCC)    Sleep apnea    CPAP- non compliant   Type II or unspecified type diabetes mellitus with neurological manifestations, not stated as uncontrolled(250.60)     Past Surgical History:  Procedure Laterality Date   ABDOMINAL HYSTERECTOMY     APPENDECTOMY  07/2022   CALCANEAL OSTEOTOMY Right 07/27/2015   Procedure: RIGHT CALCANEAL OSTEOTOMY ;  Surgeon: Norleen Armor, MD;  Location: [facility];  Service: Orthopedics;  Laterality: Right;   CHOLECYSTECTOMY     15-20 years ago   COLONOSCOPY WITH PROPOFOL  N/A 08/17/2019   Procedure: COLONOSCOPY WITH PROPOFOL ;  Surgeon: Rollin Dover, MD;  Location: [facility] ENDOSCOPY;  Service: Endoscopy;  Laterality: N/A;  COLONOSCOPY WITH PROPOFOL  N/A 06/21/2022   Procedure: COLONOSCOPY WITH PROPOFOL ;  Surgeon: Rollin Dover, MD;  Location: [facility] ENDOSCOPY;  Service: Gastroenterology;  Laterality: N/A;   ESOPHAGOGASTRODUODENOSCOPY (EGD) WITH PROPOFOL  N/A 08/17/2019   Procedure: ESOPHAGOGASTRODUODENOSCOPY (EGD) WITH PROPOFOL ;  Surgeon: Rollin Dover, MD;  Location: [facility] ENDOSCOPY;  Service: Endoscopy;  Laterality: N/A;   GASTROCNEMIUS RECESSION Right 07/27/2015   Procedure: RIGHT GASTROC RECESSION;  Surgeon: Norleen Armor, MD;  Location: [facility];  Service: Orthopedics;  Laterality: Right;   HERNIA REPAIR     IR ANGIOGRAM PULMONARY BILATERAL SELECTIVE  03/03/2024   IR ANGIOGRAM SELECTIVE EACH ADDITIONAL VESSEL  03/03/2024   IR ANGIOGRAM SELECTIVE EACH ADDITIONAL VESSEL  03/03/2024   IR IVC FILTER PLMT / S&I /IMG GUID/MOD SED  03/03/2024   IR RADIOLOGIST EVAL & MGMT  04/12/2024   IR THROMBECT PRIM MECH ADD (INCLU) MOD SED  03/03/2024   IR THROMBECT PRIM MECH ADD (INCLU) MOD SED  03/03/2024   IR THROMBECT PRIM MECH INIT (INCLU) MOD SED  03/03/2024   IR US  GUIDE VASC ACCESS RIGHT  03/03/2024   IR US  GUIDE VASC ACCESS RIGHT  03/03/2024   KIDNEY STONE SURGERY  03/23/1979   removal   LITHOTRIPSY     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   PARTIAL HYSTERECTOMY  03/22/1989   POLYPECTOMY  08/17/2019   Procedure: POLYPECTOMY;  Surgeon: Rollin Dover, MD;  Location: [facility] ENDOSCOPY;  Service: Endoscopy;;   POLYPECTOMY  06/21/2022   Procedure: POLYPECTOMY;  Surgeon: Rollin Dover, MD;  Location: [facility] ENDOSCOPY;  Service: Gastroenterology;;   RADIOLOGY WITH ANESTHESIA N/A 03/06/2024   Procedure: MRI WITH ANESTHESIA;  Surgeon: Radiologist, Medication, MD;  Location: [facility];  Service: Radiology;  Laterality: N/A;   ROBOT ASSISTED LAPAROSCOPIC NEPHRECTOMY Left 08/14/2022   Procedure: XI ROBOTIC ASSISTED LAPAROSCOPIC NEPHRECTOMY;  Surgeon: Devere Lonni Righter, MD;  Location: [facility];  Service: Urology;  Laterality: Left;   ROTATOR CUFF REPAIR  07/23/2003   XI ROBOTIC LAPAROSCOPIC ASSISTED APPENDECTOMY N/A 08/14/2022   Procedure: XI ROBOTIC LAPAROSCOPIC ASSISTED APPENDECTOMY;  Surgeon: Debby Hila, MD;  Location: [facility];  Service: General;  Laterality: N/A;    Allergies: Iodinated contrast media, Auvi-q  [epinephrine ], Codeine , E.e.s. [erythromycin], Eucalyptus oil, Glucophage  [metformin ], Iohexol , Lortab [hydrocodone -acetaminophen ], Menthol, Roxicodone  [oxycodone ], Tylenol  [acetaminophen ], Vibramycin  [doxycycline ], Xylocaine  [lidocaine ], and  Tape  Medications: Prior to Admission medications   Medication Sig Start Date End Date Taking? Authorizing Provider  albuterol  (PROAIR  HFA) 108 (90 Base) MCG/ACT inhaler Inhale 2 puffs into the lungs every 6 (six) hours as needed for wheezing or shortness of breath. 06/13/23   Bedsole, Amy E, MD  apixaban  (ELIQUIS ) 5 MG TABS tablet Take 1 tablet (5 mg total) by mouth 2 (two) times daily. 04/05/24   Federico Norleen ONEIDA MADISON, MD  atenolol  (TENORMIN ) 25 MG tablet Take 1 tablet (25 mg total) by mouth daily. 03/16/24   Bedsole, Amy E, MD  cetirizine (ZYRTEC) 10 MG tablet Take 10 mg by mouth daily.    [provider]  ciprofloxacin  (CIPRO ) 500 MG tablet Take 500 mg by mouth 2 (two) times daily. 04/13/24   [provider]  Coenzyme Q10 (COQ10 PO) Take 1 capsule by mouth daily.    [provider]  CRANBERRY PO Take 1 capsule by mouth daily.    [provider]  D-MANNOSE PO Take 2 tablets by mouth daily.    [provider]  DULoxetine  (CYMBALTA ) 60 MG capsule Take 1 capsule (60 mg total) by mouth daily. 07/11/23  Bedsole, Amy E, MD  furosemide  (LASIX ) 40 MG tablet Take 40 mg by mouth daily.    [provider]  HYDROcodone -acetaminophen  (NORCO/VICODIN) 5-325 MG tablet Take 1 tablet by mouth 2 (two) times daily as needed for severe pain (pain score 7-10). 02/16/24   [provider]  insulin  lispro (HUMALOG) 100 UNIT/ML injection Inject 100 Units into the skin See admin instructions. Max daily dose 100 units, administered continuously via pump.    [provider]  Nitrofurantoin  Monohyd Macro (MACROBID  PO) Take 1 capsule by mouth daily at 6 (six) AM.    [provider]  pantoprazole  (PROTONIX ) 40 MG tablet TAKE 1 TABLET BY MOUTH EVERY DAY 09/17/19   Wendling, Mabel Mt, DO  predniSONE  (DELTASONE ) 10 MG tablet Take 10 mg by mouth daily with breakfast.    [provider]  predniSONE  (DELTASONE ) 50 MG tablet Take one tablet 13  hours, 7 hours and 1 hour prior to your imaging study. 04/05/24   Covington, Jamie R, NP  predniSONE  (DELTASONE ) 50 MG tablet Pt to take 50 mg of prednisone  on 05/21/24 @ midnight, 50 mg of prednisone  on 05/21/24 at 6am, and 50 mg of prednisone  on 05/21/24 @ 1200 (noon). Pt is also to take 50 mg of benadryl  on 05/21/24 @ 1200 (noon). Please call [phone] with any questions. 05/21/24 05/21/24  Taffy Delconte, Ester PARAS, MD  pregabalin  (LYRICA ) 100 MG capsule Take 1 capsule (100 mg total) by mouth at bedtime as needed. 04/16/24   Bedsole, Amy E, MD  promethazine  (PHENERGAN ) 12.5 MG tablet Take 12.5 mg by mouth 2 (two) times daily as needed for nausea. 02/16/24   [provider]  senna-docusate (SENOKOT-S) 8.6-50 MG tablet Take 1-2 tablets by mouth 2 (two) times daily between meals as needed for mild constipation or moderate constipation. Patient not taking: Reported on 04/16/2024 03/08/24   Kathrin Mignon DASEN, MD     Family History  Problem Relation Age of Onset   Clotting disorder Mother    Arthritis Mother    Obesity Mother    Varicose Veins Mother    Cirrhosis Father    Arthritis Father    Depression Father    Diabetes Other        2 aunts   Cancer Other        grandmother   Allergies Other        whole family   Asthma Other        whole family   Early death Maternal Grandmother    ADD / ADHD Daughter    Anxiety disorder Daughter    Arthritis Daughter    Depression Daughter    Diabetes Daughter    Hypertension Daughter    Obesity Daughter    Arthritis Daughter    Obesity Daughter    Varicose Veins Daughter    Arthritis Son    Asthma Son    Depression Sister    Hypertension Son     Social History   Socioeconomic History   Marital status: Widowed    Spouse name: Not on file   Number of children: 5   Years of education: Not on file   Highest education level: Patient refused  Occupational History    Employer: RETIRED  Tobacco Use   Smoking status: Never   Smokeless  tobacco: Never  Vaping Use   Vaping status: Never Used  Substance and Sexual Activity   Alcohol use: No    Alcohol/week: 0.0 standard drinks of alcohol   Drug  use: No   Sexual activity: Not on file  Other Topics Concern   Not on file  Social History Narrative   Husband treated for testicular cancer, 2 cups coffee a day, no exercise   Social Drivers of Health   Financial Resource Strain: Low Risk  (01/14/2022)   Overall Financial Resource Strain (CARDIA)    Difficulty of Paying Living Expenses: Not very hard  Food Insecurity: No Food Insecurity (03/03/2024)   Hunger Vital Sign    Worried About Running Out of Food in the Last Year: Never true    Ran Out of Food in the Last Year: Never true  Transportation Needs: No Transportation Needs (03/03/2024)   PRAPARE - Administrator, Civil Service (Medical): No    Lack of Transportation (Non-Medical): No  Physical Activity: Inactive (01/14/2022)   Exercise Vital Sign    Days of Exercise per Week: 0 days    Minutes of Exercise per Session: 0 min  Stress: No Stress Concern Present (01/14/2022)   [employer] of Occupational Health - Occupational Stress Questionnaire    Feeling of Stress : Only a little  Social Connections: Moderately Integrated (03/03/2024)   Social Connection and Isolation Panel    Frequency of Communication with Friends and Family: Three times a week    Frequency of Social Gatherings with Friends and Family: Three times a week    Attends Religious Services: More than 4 times per year    Active Member of Clubs or Organizations: Yes    Attends Banker Meetings: More than 4 times per year    Marital Status: Widowed    Review of Systems: A 12 point ROS discussed and pertinent positives are indicated in the HPI above.  All other systems are negative.  Review of Systems  Vital Signs: There were no vitals taken for this visit.  Physical Exam  Labs:  CBC: Recent Labs    03/06/24 1227  03/07/24 1017 03/08/24 0520 03/16/24 1315  WBC 7.4 6.5 4.6 9.4  HGB 11.5* 10.3* 10.6* 13.1  HCT 34.9* 31.3* 31.4* 39.8  PLT 112* 100* 92* 171.0    COAGS: Recent Labs    03/03/24 0932 03/04/24 0037 03/06/24 1227 03/07/24 1017 03/08/24 0520  INR  --   --  1.9* 1.7* 1.8*  APTT >200* >200* 35  --   --     BMP: Recent Labs    03/05/24 0651 03/06/24 0747 03/07/24 1017 03/08/24 0520 03/16/24 1315  NA 136 136 135 140 139  K 4.2 4.0 3.7 4.5 3.8  CL 103 104 108 108 97  CO2 24 24 20* 23 30  GLUCOSE 308* 190* 195* 199* 129*  BUN 25* 18 16 10  28*  CALCIUM 9.0 9.0 8.4* 8.7* 9.4  CREATININE 1.33* 1.11* 1.24* 1.15* 1.25*  GFRNONAA 41* 51* 45* 49*  --     LIVER FUNCTION TESTS: Recent Labs    03/02/24 1733 03/04/24 0037 03/06/24 0747 03/07/24 1017 03/08/24 0520 03/16/24 1315  BILITOT 1.0  --   --  1.0 1.2 1.0  AST 42*  --   --  31 27 35  ALT 31  --   --  19 18 22   ALKPHOS 177*  --   --  131* 137* 244*  PROT 5.8*  --   --  5.1* 5.1* 6.8  ALBUMIN 3.1*   < > 2.8* 2.5* 2.5* 3.8   < > = values in this interval not displayed.    TUMOR MARKERS:  No results for input(s): AFPTM, CEA, CA199, CHROMGRNA in the last 8760 hours.  Assessment and Plan:  History of pulmonary embolism/DVT with IVC filter placement 03/03/24: [name] [name], 78 year old female, presents today for an image-guided inferior vena cava filter retrieval.   Risks and benefits discussed with the patient including, but not limited to bleeding, infection, contrast induced renal failure, filter fracture or migration which can lead to emergency surgery or even death, strut penetration with damage or irritation to adjacent structures and caval thrombosis.  All of the patient's questions were answered, patient is agreeable to proceed. She has been NPO.   Consent signed and in chart.  Thank you for this interesting consult.  I greatly enjoyed meeting [name] [name] and look forward to participating in their  care.  A copy of this report was sent to the requesting provider on this date.  Ester Sides, MD Pager: [phone]    I spent a total of  30 Minutes   in face to face in clinical consultation, greater than 50% of which was counseling/coordinating care for IVC filter retrieval.

## 2024-05-21 ENCOUNTER — Ambulatory Visit
Admission: RE | Admit: 2024-05-21 | Discharge: 2024-05-21 | Disposition: A | Discharge status: Home / Self Care | Source: Ambulatory Visit | Attending: Interventional Radiology | Admitting: Interventional Radiology

## 2024-05-21 DIAGNOSIS — Z4589 Encounter for adjustment and management of other implanted devices: Secondary | ICD-10-CM | POA: Diagnosis not present

## 2024-05-21 DIAGNOSIS — Z95828 Presence of other vascular implants and grafts: Secondary | ICD-10-CM

## 2024-05-21 HISTORY — PX: IR IVC FILTER RETRIEVAL / S&I /IMG GUID/MOD SED: IMG5308

## 2024-05-21 MED ORDER — MIDAZOLAM HCL (PF) 2 MG/2ML IJ SOLN
1.0000 mg | INTRAMUSCULAR | Status: DC | PRN
  Administered 2024-05-21 (×2): 1 mg via INTRAVENOUS

## 2024-05-21 MED ORDER — FENTANYL CITRATE (PF) 50 MCG/ML IJ SOSY
25.0000 ug | PREFILLED_SYRINGE | INTRAMUSCULAR | Status: DC | PRN
  Administered 2024-05-21 (×2): 50 ug via INTRAVENOUS

## 2024-05-21 MED ORDER — IOPAMIDOL (ISOVUE-300) INJECTION 61%
100.0000 mL | Freq: Once | INTRAVENOUS | Status: AC | PRN
Start: 2024-05-21 — End: 2024-05-21
  Administered 2024-05-21: 40 mL via INTRAVENOUS

## 2024-05-21 MED ORDER — LIDOCAINE-EPINEPHRINE 1 %-1:100000 IJ SOLN
10.0000 mL | Freq: Once | INTRAMUSCULAR | Status: AC
  Administered 2024-05-21: 10 mL via INTRADERMAL

## 2024-05-21 MED ORDER — SODIUM CHLORIDE 0.9 % IV SOLN: INTRAVENOUS | Status: DC

## 2024-05-21 NOTE — Progress Notes (Signed)
 Pt with c/o pressure to mid upper abdominal area, denies pain, see flowsheets for VS, Dr. Jennefer made aware, no new orders at this time, Snack of water  and peanut butter crackers given, pt states she gets shaky after anesthesia/sedation, safety maintained, daughter remains at bedside

## 2024-05-21 NOTE — Procedures (Signed)
 Interventional Radiology Procedure Note  Procedure: IVC filter retrieval  Findings: Please refer to procedural dictation for full description. R internal jugular 12 Fr access.  Complications: None immediate  Estimated Blood Loss: < 5 mL  Recommendations: Follow up with IR as needed.   Ester Sides, MD

## 2024-05-24 ENCOUNTER — Telehealth: Payer: Self-pay

## 2024-05-24 NOTE — Progress Notes (Signed)
 See telephone note

## 2024-06-02 ENCOUNTER — Inpatient Hospital Stay: Attending: Hematology and Oncology

## 2024-06-02 ENCOUNTER — Other Ambulatory Visit: Payer: Self-pay | Admitting: *Deleted

## 2024-06-02 ENCOUNTER — Inpatient Hospital Stay (HOSPITAL_BASED_OUTPATIENT_CLINIC_OR_DEPARTMENT_OTHER): Admitting: Hematology and Oncology

## 2024-06-02 VITALS — BP 120/65 | HR 60 | Temp 97.9°F | Resp 13 | Wt 188.5 lb

## 2024-06-02 DIAGNOSIS — C642 Malignant neoplasm of left kidney, except renal pelvis: Secondary | ICD-10-CM | POA: Diagnosis not present

## 2024-06-02 DIAGNOSIS — K862 Cyst of pancreas: Secondary | ICD-10-CM | POA: Insufficient documentation

## 2024-06-02 DIAGNOSIS — I2699 Other pulmonary embolism without acute cor pulmonale: Secondary | ICD-10-CM | POA: Insufficient documentation

## 2024-06-02 DIAGNOSIS — Z7901 Long term (current) use of anticoagulants: Secondary | ICD-10-CM | POA: Insufficient documentation

## 2024-06-02 DIAGNOSIS — Z85528 Personal history of other malignant neoplasm of kidney: Secondary | ICD-10-CM | POA: Diagnosis not present

## 2024-06-02 DIAGNOSIS — Z905 Acquired absence of kidney: Secondary | ICD-10-CM | POA: Diagnosis not present

## 2024-06-02 DIAGNOSIS — N2889 Other specified disorders of kidney and ureter: Secondary | ICD-10-CM

## 2024-06-02 LAB — CBC WITH DIFFERENTIAL (CANCER CENTER ONLY)
Abs Immature Granulocytes: 0.06 K/uL (ref 0.00–0.07)
Basophils Absolute: 0 K/uL (ref 0.0–0.1)
Basophils Relative: 0 %
Eosinophils Absolute: 0.1 K/uL (ref 0.0–0.5)
Eosinophils Relative: 1 %
HCT: 39 % (ref 36.0–46.0)
Hemoglobin: 12.5 g/dL (ref 12.0–15.0)
Immature Granulocytes: 1 %
Lymphocytes Relative: 9 %
Lymphs Abs: 0.9 K/uL (ref 0.7–4.0)
MCH: 28.5 pg (ref 26.0–34.0)
MCHC: 32.1 g/dL (ref 30.0–36.0)
MCV: 88.8 fL (ref 80.0–100.0)
Monocytes Absolute: 0.4 K/uL (ref 0.1–1.0)
Monocytes Relative: 4 %
Neutro Abs: 8.9 K/uL — ABNORMAL HIGH (ref 1.7–7.7)
Neutrophils Relative %: 85 %
Platelet Count: 130 K/uL — ABNORMAL LOW (ref 150–400)
RBC: 4.39 MIL/uL (ref 3.87–5.11)
RDW: 14 % (ref 11.5–15.5)
WBC Count: 10.4 K/uL (ref 4.0–10.5)
nRBC: 0 % (ref 0.0–0.2)

## 2024-06-02 LAB — CMP (CANCER CENTER ONLY)
ALT: 40 U/L (ref 0–44)
AST: 30 U/L (ref 15–41)
Albumin: 3.4 g/dL — ABNORMAL LOW (ref 3.5–5.0)
Alkaline Phosphatase: 165 U/L — ABNORMAL HIGH (ref 38–126)
Anion gap: 7 (ref 5–15)
BUN: 28 mg/dL — ABNORMAL HIGH (ref 8–23)
CO2: 28 mmol/L (ref 22–32)
Calcium: 9.5 mg/dL (ref 8.9–10.3)
Chloride: 104 mmol/L (ref 98–111)
Creatinine: 1.29 mg/dL — ABNORMAL HIGH (ref 0.44–1.00)
GFR, Estimated: 42 mL/min — ABNORMAL LOW (ref >60)
Glucose, Bld: 147 mg/dL — ABNORMAL HIGH (ref 70–99)
Potassium: 5 mmol/L (ref 3.5–5.1)
Sodium: 139 mmol/L (ref 135–145)
Total Bilirubin: 0.9 mg/dL (ref 0.0–1.2)
Total Protein: 6.1 g/dL — ABNORMAL LOW (ref 6.5–8.1)

## 2024-06-02 NOTE — Progress Notes (Signed)
 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 Federico  08/05/2022: CT Chest WO showed numerous bilateral pulmonary nodules concerning for metastatic disease without interval change (from 06/17/2022 CT scan) 08/14/2022: left kidney resetion and appendectomy. Pathology confirms Clear Cell RCC  Interval History:  [name] [name] 78 y.o. female with medical history significant clear-cell carcinoma of the kidney status post resection who presents for a follow up visit.  In the interim since the last visit she has had no major changes in her health but has had numerous ER visits and hospitalizations due to falls.  On exam today [name] [name] reports she has had a rough year.  She reports that she has had numerous falls and a couple episodes where she has passed out.  She was found to have a blood clot in August 2025 for which she started on Eliquis  therapy.  She reports he is tolerating Eliquis  well with no bleeding, bruising, or dark stools.  She thinks that this was due to her spending time in the hospital being immobile and not moving her legs.  She reports that she has been having no problems with her weight and has been quite steady.  She continues to have trouble with her bladder since the cancer surgery and continues to follow with urology to have this managed.  She has not had any recent fevers, chills, sweats, nausea, vomiting or diarrhea.  A full 10 point ROS is otherwise negative.  MEDICAL HISTORY:  Past Medical History:  Diagnosis Date   Allergic rhinitis, cause unspecified    Allergy    Anxiety    Arthritis    hands, feet and back   Asthma    Benign  neoplasm of colon    Blood transfusion without reported diagnosis    Bursitis    hips   Cataract    CHF (congestive heart failure) (HCC)    Cirrhosis (HCC)    auto immune   Complication of anesthesia     i WAKE UP WITH TREMORS  after shoulder surgery tremors for 10 months after surgery surgery- 2009- surgical center in [place]   Cyst and pseudocyst of pancreas    Depressive disorder, not elsewhere classified    Diabetes mellitus without complication (HCC)    Diverticulitis    Diverticulosis of colon (without mention of hemorrhage)    Dyspnea    Dysrhythmia    arrhythmia   Emphysema of lung (HCC)    Esophageal reflux    Essential and other specified forms of tremor    History of GI bleed    History of kidney stones    Irritable bowel syndrome    Obesity, unspecified    Other specified cardiac dysrhythmias(427.89)    Periapical abscess without sinus    Polyneuropathy in diabetes(357.2)    PONV (postoperative nausea and vomiting)    Pure hypercholesterolemia    Renal cell carcinoma (HCC)    Sleep apnea    CPAP- non compliant   Type II or unspecified type diabetes mellitus with neurological manifestations, not stated as uncontrolled(250.60)     SURGICAL HISTORY: Past Surgical History:  Procedure Laterality Date   ABDOMINAL HYSTERECTOMY     APPENDECTOMY  07/2022  CALCANEAL OSTEOTOMY Right 07/27/2015   Procedure: RIGHT CALCANEAL OSTEOTOMY ;  Surgeon: Norleen Armor, MD;  Location: [facility];  Service: Orthopedics;  Laterality: Right;   CHOLECYSTECTOMY     15-20 years ago   COLONOSCOPY WITH PROPOFOL  N/A 08/17/2019   Procedure: COLONOSCOPY WITH PROPOFOL ;  Surgeon: Rollin Dover, MD;  Location: [facility];  Service: Endoscopy;  Laterality: N/A;   COLONOSCOPY WITH PROPOFOL  N/A 06/21/2022   Procedure: COLONOSCOPY WITH PROPOFOL ;  Surgeon: Rollin Dover, MD;  Location: [facility];  Service: Gastroenterology;  Laterality: N/A;   ESOPHAGOGASTRODUODENOSCOPY  (EGD) WITH PROPOFOL  N/A 08/17/2019   Procedure: ESOPHAGOGASTRODUODENOSCOPY (EGD) WITH PROPOFOL ;  Surgeon: Rollin Dover, MD;  Location: [facility] ENDOSCOPY;  Service: Endoscopy;  Laterality: N/A;   GASTROCNEMIUS RECESSION Right 07/27/2015   Procedure: RIGHT GASTROC RECESSION;  Surgeon: Norleen Armor, MD;  Location: [facility];  Service: Orthopedics;  Laterality: Right;   HERNIA REPAIR     IR ANGIOGRAM PULMONARY BILATERAL SELECTIVE  03/03/2024   IR ANGIOGRAM SELECTIVE EACH ADDITIONAL VESSEL  03/03/2024   IR ANGIOGRAM SELECTIVE EACH ADDITIONAL VESSEL  03/03/2024   IR IVC FILTER PLMT / S&I /IMG GUID/MOD SED  03/03/2024   IR IVC FILTER RETRIEVAL / S&I PORTER GUID/MOD SED  05/21/2024   IR RADIOLOGIST EVAL & MGMT  04/12/2024   IR THROMBECT PRIM MECH ADD (INCLU) MOD SED  03/03/2024   IR THROMBECT PRIM MECH ADD (INCLU) MOD SED  03/03/2024   IR THROMBECT PRIM MECH INIT (INCLU) MOD SED  03/03/2024   IR US  GUIDE VASC ACCESS RIGHT  03/03/2024   IR US  GUIDE VASC ACCESS RIGHT  03/03/2024   KIDNEY STONE SURGERY  03/23/1979   removal   LITHOTRIPSY     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   PARTIAL HYSTERECTOMY  03/22/1989   POLYPECTOMY  08/17/2019   Procedure: POLYPECTOMY;  Surgeon: Rollin Dover, MD;  [facility];  Service: Endoscopy;;   POLYPECTOMY  06/21/2022   Procedure: POLYPECTOMY;  Surgeon: Rollin Dover, MD;  Location: [facility];  Service: Gastroenterology;;   RADIOLOGY WITH ANESTHESIA N/A 03/06/2024   Procedure: MRI WITH ANESTHESIA;  Surgeon: Radiologist, Medication, MD;  Location: [facility];  Service: Radiology;  Laterality: N/A;   ROBOT ASSISTED LAPAROSCOPIC NEPHRECTOMY Left 08/14/2022   Procedure: XI ROBOTIC ASSISTED LAPAROSCOPIC NEPHRECTOMY;  Surgeon: Devere Lonni Righter, MD;  Location: [facility];  Service: Urology;  Laterality: Left;   ROTATOR CUFF REPAIR  07/23/2003   XI ROBOTIC LAPAROSCOPIC ASSISTED APPENDECTOMY N/A 08/14/2022   Procedure: XI ROBOTIC LAPAROSCOPIC ASSISTED APPENDECTOMY;   Surgeon: Debby Hila, MD;  Location: [facility];  Service: General;  Laterality: N/A;    SOCIAL HISTORY: Social History   Socioeconomic History   Marital status: Widowed    Spouse name: Not on file   Number of children: 5   Years of education: Not on file   Highest education level: Patient refused  Occupational History    Employer: RETIRED  Tobacco Use   Smoking status: Never   Smokeless tobacco: Never  Vaping Use   Vaping status: Never Used  Substance and Sexual Activity   Alcohol use: No    Alcohol/week: 0.0 standard drinks of alcohol   Drug use: No   Sexual activity: Not on file  Other Topics Concern   Not on file  Social History Narrative   Husband treated for testicular cancer, 2 cups coffee a day, no exercise   Social Drivers of Health   Financial Resource Strain: Low Risk  (01/14/2022)   Overall Financial  Resource Strain (CARDIA)    Difficulty of Paying Living Expenses: Not very hard  Food Insecurity: No Food Insecurity (03/03/2024)   Hunger Vital Sign    Worried About Running Out of Food in the Last Year: Never true    Ran Out of Food in the Last Year: Never true  Transportation Needs: No Transportation Needs (03/03/2024)   PRAPARE - Administrator, Civil Service (Medical): No    Lack of Transportation (Non-Medical): No  Physical Activity: Inactive (01/14/2022)   Exercise Vital Sign    Days of Exercise per Week: 0 days    Minutes of Exercise per Session: 0 min  Stress: No Stress Concern Present (01/14/2022)   [employer] of Occupational Health - Occupational Stress Questionnaire    Feeling of Stress : Only a little  Social Connections: Moderately Integrated (03/03/2024)   Social Connection and Isolation Panel    Frequency of Communication with Friends and Family: Three times a week    Frequency of Social Gatherings with Friends and Family: Three times a week    Attends Religious Services: More than 4 times per year    Active Member of Clubs or  Organizations: Yes    Attends Banker Meetings: More than 4 times per year    Marital Status: Widowed  Intimate Partner Violence: Not At Risk (03/03/2024)   Humiliation, Afraid, Rape, and Kick questionnaire    Fear of Current or Ex-Partner: No    Emotionally Abused: No    Physically Abused: No    Sexually Abused: No    FAMILY HISTORY: Family History  Problem Relation Age of Onset   Clotting disorder Mother    Arthritis Mother    Obesity Mother    Varicose Veins Mother    Cirrhosis Father    Arthritis Father    Depression Father    Diabetes Other        2 aunts   Cancer Other        grandmother   Allergies Other        whole family   Asthma Other        whole family   Early death Maternal Grandmother    ADD / ADHD Daughter    Anxiety disorder Daughter    Arthritis Daughter    Depression Daughter    Diabetes Daughter    Hypertension Daughter    Obesity Daughter    Arthritis Daughter    Obesity Daughter    Varicose Veins Daughter    Arthritis Son    Asthma Son    Depression Sister    Hypertension Son     ALLERGIES:  is allergic to iodinated contrast media, auvi-q  [epinephrine ], codeine , e.e.s. [erythromycin], eucalyptus oil, glucophage  [metformin ], iohexol , lortab [hydrocodone -acetaminophen ], menthol, roxicodone  [oxycodone ], tylenol  [acetaminophen ], vibramycin  [doxycycline ], xylocaine  [lidocaine ], and tape.  MEDICATIONS:  Current Outpatient Medications  Medication Sig Dispense Refill   albuterol  (PROAIR  HFA) 108 (90 Base) MCG/ACT inhaler Inhale 2 puffs into the lungs every 6 (six) hours as needed for wheezing or shortness of breath. 8 g 2   apixaban  (ELIQUIS ) 5 MG TABS tablet Take 1 tablet (5 mg total) by mouth 2 (two) times daily. 60 tablet 3   atenolol  (TENORMIN ) 25 MG tablet Take 1 tablet (25 mg total) by mouth daily. 90 tablet 3   cetirizine (ZYRTEC) 10 MG tablet Take 10 mg by mouth daily.     Coenzyme Q10 (COQ10 PO) Take 1 capsule by mouth  daily.  CRANBERRY PO Take 1 capsule by mouth daily.     D-MANNOSE PO Take 2 tablets by mouth daily.     DULoxetine  (CYMBALTA ) 60 MG capsule Take 1 capsule (60 mg total) by mouth daily. 90 capsule 3   furosemide  (LASIX ) 40 MG tablet Take 40 mg by mouth daily.     insulin  lispro (HUMALOG) 100 UNIT/ML injection Inject 100 Units into the skin See admin instructions. Max daily dose 100 units, administered continuously via pump.     Nitrofurantoin  Monohyd Macro (MACROBID  PO) Take 1 capsule by mouth daily at 6 (six) AM.     pantoprazole  (PROTONIX ) 40 MG tablet TAKE 1 TABLET BY MOUTH EVERY DAY 30 tablet 0   predniSONE  (DELTASONE ) 10 MG tablet Take 10 mg by mouth daily with breakfast.     pregabalin  (LYRICA ) 100 MG capsule Take 1 capsule (100 mg total) by mouth at bedtime as needed. 30 capsule 1   promethazine  (PHENERGAN ) 12.5 MG tablet Take 12.5 mg by mouth 2 (two) times daily as needed for nausea.     senna-docusate (SENOKOT-S) 8.6-50 MG tablet Take 1-2 tablets by mouth 2 (two) times daily between meals as needed for mild constipation or moderate constipation.     predniSONE  (DELTASONE ) 50 MG tablet Take one tablet 13 hours, 7 hours and 1 hour prior to your imaging study. (Patient not taking: Reported on 06/02/2024) 3 tablet 0   No current facility-administered medications for this visit.    REVIEW OF SYSTEMS:   Constitutional: ( - ) fevers, ( - )  chills , ( - ) night sweats Eyes: ( - ) blurriness of vision, ( - ) double vision, ( - ) watery eyes Ears, nose, mouth, throat, and face: ( - ) mucositis, ( - ) sore throat Respiratory: ( - ) cough, ( - ) dyspnea, ( - ) wheezes Cardiovascular: ( - ) palpitation, ( - ) chest discomfort, ( - ) lower extremity swelling Gastrointestinal:  ( - ) nausea, ( - ) heartburn, ( - ) change in bowel habits Skin: ( - ) abnormal skin rashes Lymphatics: ( - ) new lymphadenopathy, ( - ) easy bruising Neurological: ( - ) numbness, ( - ) tingling, ( - ) new  weaknesses Behavioral/Psych: ( - ) mood change, ( - ) new changes  All other systems were reviewed with the patient and are negative.  PHYSICAL EXAMINATION:  Vitals:   06/02/24 1514  BP: 120/65  Pulse: 60  Resp: 13  Temp: 97.9 F (36.6 C)  SpO2: 98%      Filed Weights   06/02/24 1514  Weight: 188 lb 8 oz (85.5 kg)       GENERAL: Well-appearing elderly Caucasian female, alert, no distress and comfortable SKIN: skin color, texture, turgor are normal, no rashes or significant lesions EYES: conjunctiva are pink and non-injected, sclera clear LUNGS: clear to auscultation and percussion with normal breathing effort HEART: regular rate & rhythm and no murmurs and no lower extremity edema Musculoskeletal: no cyanosis of digits and no clubbing  PSYCH: alert & oriented x 3, fluent speech NEURO: no focal motor/sensory deficits  LABORATORY DATA:  I have reviewed the data as listed    Latest Ref Rng & Units 06/02/2024    2:47 PM 03/16/2024    1:15 PM 03/08/2024    5:20 AM  CBC  WBC 4.0 - 10.5 K/uL 10.4  9.4  4.6   Hemoglobin 12.0 - 15.0 g/dL 87.4  86.8  89.3   Hematocrit 36.0 -  46.0 % 39.0  39.8  31.4   Platelets 150 - 400 K/uL 130  171.0  92        Latest Ref Rng & Units 06/02/2024    2:47 PM 03/16/2024    1:15 PM 03/08/2024    5:20 AM  CMP  Glucose 70 - 99 mg/dL 852  870  800   BUN 8 - 23 mg/dL 28  28  10    Creatinine 0.44 - 1.00 mg/dL 8.70  8.74  8.84   Sodium 135 - 145 mmol/L 139  139  140   Potassium 3.5 - 5.1 mmol/L 5.0  3.8  4.5   Chloride 98 - 111 mmol/L 104  97  108   CO2 22 - 32 mmol/L 28  30  23    Calcium 8.9 - 10.3 mg/dL 9.5  9.4  8.7   Total Protein 6.5 - 8.1 g/dL 6.1  6.8  5.1   Total Bilirubin 0.0 - 1.2 mg/dL 0.9  1.0  1.2   Alkaline Phos 38 - 126 U/L 165  244  137   AST 15 - 41 U/L 30  35  27   ALT 0 - 44 U/L 40  22  18    RADIOGRAPHIC STUDIES: I have personally reviewed the radiological images as listed and agreed with the findings in the report:  Lung nodules, most prominently in the left lower lung. IR IVC FILTER RETRIEVAL W LASER/IMG GUID Result Date: 05/21/2024 INDICATION: 78 year old female with history of thromboembolic disease status post IVC filter placement on 03/03/2024. EXAM: 1. IR IVC FILTER RETRIEVAL/S+I/ IMAGE GUIDE MODERATE SEDATION 2. IR ULTRASOUND GUIDANCE VASC ACCESS COMPARISON:  04/09/2024 MEDICATIONS: None. ANESTHESIA/SEDATION: Moderate (conscious) sedation was employed during this procedure. A total of Versed  2 mg and Fentanyl  100 mcg was administered intravenously. Moderate Sedation Time: 9 minutes. The patient's level of consciousness and vital signs were monitored continuously by radiology nursing throughout the procedure under my direct supervision. CONTRAST:  40 mL Isovue  300, intravenous COMPLICATIONS: None immediate TECHNIQUE: Informed written consent was obtained from the patient after a discussion of the risks, benefits and alternatives to treatment. Questions regarding the procedure were encouraged and answered. A timeout was performed prior to the initiation of the procedure. The right neck was prepped and draped in the usual sterile fashion, and a sterile drape was applied covering the operative field. Maximum barrier sterile technique with sterile gowns and gloves were used for the procedure. A timeout was performed prior to the initiation of the procedure. Local anesthesia was provided with 1% lidocaine . Under direct ultrasound guidance, the right internal jugular vein was accessed with a micropuncture needle at the overlying soft tissues were anesthetized with 1% lidocaine . An ultrasound image was saved for documentation purposes. Serial dilation was performed of the wire 5 likely Smit of a a 12 French Argon triple loop snare IVC filter retrieval sheath. Inferior vena cavagram was then performed. The hook of the filter was successfully snared and the filter was withdrawn intact into the co-axial sheath. A completion  inferior venacavagram was performed. At this point, the procedure was terminated. All wires, catheters and sheaths were removed from the patient. Hemostasis was achieved at the right neck access site with manual compression. A dressing was placed. The patient tolerated the above procedure well without immediate postprocedural complication. FINDINGS: Inferior venacavagram demonstrates wide patency of the IVC. There is no thrombus within the infrarenal IVC filter which appears unchanged in position from the time of initial placement. The IVC  filter was successfully removed using a snare device as detailed above. Completion inferior venacavagram was negative for caval injury. IMPRESSION: 1. Successful fluoroscopic guided removal of infrarenal IVC filter. 2. Normal inferior venacavagram. Ester Sides, MD [facility] Electronically Signed   By: Ester Sides M.D.   On: 05/21/2024 14:35    ASSESSMENT & PLAN [name] [name] is a 78 y.o. female with medical history significant for a left renal mass, dilated appendix who presents for a follow up visit.   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  # Pulmonary Nodules  --CT abdomen/pelvis from 05/29/2022 showed findings concerning for renal cell carcinoma.  --underwent robotic assisted laparoscopic appendectomy and nephrectomy joint procedure on 08/14/2022. --Pathology findings are consistent for a benign appendix but clear-cell RCC of left kidney. --Etiology of the small lung nodules is unclear, though on last CT scan they are decreased in size (01/03/2023).   --Per NCCN guidelines, we will do labs and CT scan every 6 months for 3 years and then annually for up to 5 years. Plan:  --Labs today show white blood cell count 10.4, Hgb 12.5, MCV 88.8, Plt 130. Creatinine stable at 1.29. LFTs in range.  --No clinical signs of recurrence.  --CT CAP from 02/26/2024 showed no evidence of recurrence or metastatic  disease. However did note new pulmonary emboli. Next CT scan Feb 2026.  Of note she does have a contrast allergy for which she has to take prednisone .  She is considering whether or not she wants the scan without contrast due to the agitation she gets with steroids. --Continue on surveillance and return in 3 months with repeat CT imaging.   # Multiple Pulmonary Emboli -- Noted on CT scan from 02/26/2024.  Noted to have main, right main, segmental right upper and middle lobe pulmonary emboli.  No heart strain. -- continue Eliquis  therapy 5 mg twice daily -- Appears to be unprovoked in nature, would recommend indefinite anticoagulation.   #Pancreatic cystic lesions: --Last CT scan from 06/30/2023 showed possibly mild progression of size, although likely benign.  --Continue to monitor.   Orders Placed This Encounter  Procedures   CT CHEST ABDOMEN PELVIS W CONTRAST    Standing Status:   Future    Expected Date:   08/23/2024    Expiration Date:   06/02/2025    If indicated for the ordered procedure, I authorize the administration of contrast media per Radiology protocol:   Yes    Does the patient have a contrast media/X-ray dye allergy?:   Yes    Preferred imaging location?:   [facility]    If indicated for the ordered procedure, I authorize the administration of oral contrast media per Radiology protocol:   Yes   All questions were answered. The patient knows to call the clinic with any problems, questions or concerns.  A total of more than 30 minutes were spent on this encounter with face-to-face time and non-face-to-face time, including preparing to see the patient, ordering tests and/or medications, counseling the patient and coordination of care as outlined above.   Norleen IVAR Kidney, MD Department of Hematology/Oncology [facility] Phone: [phone] Pager: [phone] Email: [email or web address]  06/02/2024 4:36 PM

## 2024-06-03 DIAGNOSIS — E1065 Type 1 diabetes mellitus with hyperglycemia: Secondary | ICD-10-CM | POA: Diagnosis not present

## 2024-06-07 DIAGNOSIS — N3946 Mixed incontinence: Secondary | ICD-10-CM | POA: Diagnosis not present

## 2024-06-07 DIAGNOSIS — N3021 Other chronic cystitis with hematuria: Secondary | ICD-10-CM | POA: Diagnosis not present

## 2024-06-08 ENCOUNTER — Ambulatory Visit: Admitting: Physician Assistant

## 2024-07-02 ENCOUNTER — Ambulatory Visit: Payer: Self-pay

## 2024-07-02 NOTE — Telephone Encounter (Signed)
 FYI Only or Action Required?: Action required by provider: clinical question for provider, update on patient condition, and request for documentation or forms.  Patient was last seen in primary care on 04/16/2024 by Avelina Greig [name], MD.  Called Nurse Triage reporting Fall.  Symptoms began today.  Interventions attempted: Other: physical therapy.  Symptoms are: unchanged.  Triage Disposition: See PCP When Office is Open (Within 3 Days)  Patient/caregiver understands and will follow disposition?: Yes   Copied from CRM #[id number]. Topic: Clinical - Red Word Triage >> Jul 02, 2024  2:41 PM Harlene ORN wrote: Red Word that prompted transfer to Nurse Triage: I have a physical therapist on the line requesting recertification for physical therapy. But she's also saying that the patient fell and hurt her left hip this morning around 10 am. But her son refused her EMT to help her get off the floor. Reason for Disposition  MILD weakness (e.g., does not interfere with ability to work, go to school, normal activities)  (Exception: Mild weakness is a chronic symptom.)  Answer Assessment - Initial Assessment Questions Cecelia, from [facility], contacted clinic to report pt fall. States that pt has had multiple falls this year; she did state this is her first time working with PT so she was unable to give a baseline. States that pt fell when getting out of bed at 10am d/t not using her walker. States pt was unable to get herself up and her neighbors found her 2 hours later, they called EMS at that time. Pt's son arrived before EMS and got pt up to recliner then refused EMS. Cecelia, physical therapy, arrived this afternoon and pt informed her of the above. Stated that she did not hit her head but did hit her L hip and knee. Pt reports rest pain at 7/10 but improves with ambulation. PT did work with therapist and was able to do full session.  Cecelia also requesting extension for physical therapy. She can be  reached at, [phone].   1. MECHANISM: How did the fall happen?     Pt is supposed to use walker per PT, but pt did not use walker when trying to get out of bed and fell. Pt fell and was unable to get up for 2 hours, neighbors found her and called EMS. Son arrived at home first and refused EMS, got pt off floor and placed her in recliner.   3. ONSET: When did the fall happen? (e.g., minutes, hours, or days ago)     Today, 12/12 at 10am  4. LOCATION: What part of the body hit the ground? (e.g., back, buttocks, head, hips, knees, hands, head, stomach)     L hip and knee  5. INJURY: Did you hurt (injure) yourself when you fell? If Yes, ask: What did you injure? Tell me more about this? (e.g., body area; type of injury; pain severity)     Yes; pain to hip and knee. Denies hitting head  6. PAIN: Is there any pain? If Yes, ask: How bad is the pain? (e.g., Scale 0-10; or none, mild,      Pt rated pain as 7/10 to PT when standing; reports pain better with movement, decreased to 6 with ambulation   7. SIZE: For cuts, bruises, or swelling, ask: How large is it? (e.g., inches or centimeters)      No visible injuries; pt denies injury    9. OTHER SYMPTOMS: Do you have any other symptoms? (e.g., dizziness, fever, weakness; new-onset or worsening).  none  10. CAUSE: What do you think caused the fall (or falling)? (e.g., dizzy spell, tripped)       Did not use ambulation aid  Protocols used: Falls and [facility]

## 2024-07-02 NOTE — Telephone Encounter (Addendum)
 Verbal orders given to [facility] to extend PT as requested per Dr. Avelina.   I spoke with Rico (daughter) if her mom is having significant issues from fall,  she needs to make an appointment early next week. If she is having severe pain she needs to be seen over the weekend at an urgent care.  Rico was unaware of the fall but will call us  next week if patient needs to be seen.

## 2024-07-16 ENCOUNTER — Other Ambulatory Visit: Payer: Self-pay | Admitting: Family Medicine

## 2024-07-27 DIAGNOSIS — I2602 Saddle embolus of pulmonary artery with acute cor pulmonale: Secondary | ICD-10-CM | POA: Diagnosis not present

## 2024-07-27 DIAGNOSIS — I951 Orthostatic hypotension: Secondary | ICD-10-CM | POA: Diagnosis not present

## 2024-07-27 DIAGNOSIS — F0394 Unspecified dementia, unspecified severity, with anxiety: Secondary | ICD-10-CM | POA: Diagnosis not present

## 2024-07-27 DIAGNOSIS — F32A Depression, unspecified: Secondary | ICD-10-CM | POA: Diagnosis not present

## 2024-07-27 DIAGNOSIS — I5030 Unspecified diastolic (congestive) heart failure: Secondary | ICD-10-CM | POA: Diagnosis not present

## 2024-07-27 DIAGNOSIS — N1831 Chronic kidney disease, stage 3a: Secondary | ICD-10-CM | POA: Diagnosis not present

## 2024-07-27 DIAGNOSIS — F0393 Unspecified dementia, unspecified severity, with mood disturbance: Secondary | ICD-10-CM | POA: Diagnosis not present

## 2024-07-27 DIAGNOSIS — Z794 Long term (current) use of insulin: Secondary | ICD-10-CM | POA: Diagnosis not present

## 2024-07-27 DIAGNOSIS — E1122 Type 2 diabetes mellitus with diabetic chronic kidney disease: Secondary | ICD-10-CM | POA: Diagnosis not present

## 2024-07-27 DIAGNOSIS — K7460 Unspecified cirrhosis of liver: Secondary | ICD-10-CM | POA: Diagnosis not present

## 2024-07-27 DIAGNOSIS — I4891 Unspecified atrial fibrillation: Secondary | ICD-10-CM | POA: Diagnosis not present

## 2024-07-27 DIAGNOSIS — K219 Gastro-esophageal reflux disease without esophagitis: Secondary | ICD-10-CM | POA: Diagnosis not present

## 2024-08-01 ENCOUNTER — Other Ambulatory Visit: Payer: Self-pay | Admitting: Hematology and Oncology

## 2024-08-23 ENCOUNTER — Ambulatory Visit (HOSPITAL_COMMUNITY): Admission: RE | Admit: 2024-08-23 | Source: Ambulatory Visit

## 2024-09-03 ENCOUNTER — Inpatient Hospital Stay: Admitting: Hematology and Oncology

## 2024-09-03 ENCOUNTER — Inpatient Hospital Stay

## 2024-09-20 ENCOUNTER — Other Ambulatory Visit (HOSPITAL_COMMUNITY)

## 2024-10-14 ENCOUNTER — Ambulatory Visit: Admitting: Family Medicine
